# Patient Record
Sex: Female | Born: 1960
Health system: Southern US, Community
[De-identification: ages and names within clinical notes are randomized; demographics above are authoritative.]

## PROBLEM LIST (undated history)

## (undated) DIAGNOSIS — T7840XA Allergy, unspecified, initial encounter: Secondary | ICD-10-CM

## (undated) DIAGNOSIS — Z9889 Other specified postprocedural states: Secondary | ICD-10-CM

## (undated) DIAGNOSIS — J45909 Unspecified asthma, uncomplicated: Secondary | ICD-10-CM

## (undated) DIAGNOSIS — I10 Essential (primary) hypertension: Secondary | ICD-10-CM

## (undated) DIAGNOSIS — C801 Malignant (primary) neoplasm, unspecified: Secondary | ICD-10-CM

## (undated) DIAGNOSIS — E119 Type 2 diabetes mellitus without complications: Secondary | ICD-10-CM

## (undated) DIAGNOSIS — M1990 Unspecified osteoarthritis, unspecified site: Secondary | ICD-10-CM

## (undated) DIAGNOSIS — Z801 Family history of malignant neoplasm of trachea, bronchus and lung: Secondary | ICD-10-CM

## (undated) DIAGNOSIS — G43909 Migraine, unspecified, not intractable, without status migrainosus: Secondary | ICD-10-CM

## (undated) DIAGNOSIS — R112 Nausea with vomiting, unspecified: Secondary | ICD-10-CM

## (undated) DIAGNOSIS — Z8489 Family history of other specified conditions: Secondary | ICD-10-CM

## (undated) HISTORY — DX: Unspecified osteoarthritis, unspecified site: M19.90

## (undated) HISTORY — DX: Unspecified asthma, uncomplicated: J45.909

## (undated) HISTORY — PX: DILATION AND CURETTAGE OF UTERUS: SHX78

## (undated) HISTORY — DX: Allergy, unspecified, initial encounter: T78.40XA

## (undated) HISTORY — DX: Migraine, unspecified, not intractable, without status migrainosus: G43.909

## (undated) HISTORY — DX: Essential (primary) hypertension: I10

## (undated) HISTORY — DX: Family history of malignant neoplasm of trachea, bronchus and lung: Z80.1

---

## 1992-05-08 HISTORY — PX: BREAST CYST EXCISION: SHX579

## 1998-06-04 ENCOUNTER — Emergency Department (HOSPITAL_COMMUNITY): Admission: EM | Admit: 1998-06-04 | Discharge: 1998-06-04 | Payer: Self-pay | Admitting: Emergency Medicine

## 1999-04-28 ENCOUNTER — Other Ambulatory Visit: Admission: RE | Admit: 1999-04-28 | Discharge: 1999-04-28 | Payer: Self-pay | Admitting: Obstetrics and Gynecology

## 2000-08-14 ENCOUNTER — Other Ambulatory Visit: Admission: RE | Admit: 2000-08-14 | Discharge: 2000-08-14 | Payer: Self-pay | Admitting: Obstetrics and Gynecology

## 2002-05-28 ENCOUNTER — Other Ambulatory Visit: Admission: RE | Admit: 2002-05-28 | Discharge: 2002-05-28 | Payer: Self-pay | Admitting: Obstetrics and Gynecology

## 2002-07-10 ENCOUNTER — Encounter: Payer: Self-pay | Admitting: Obstetrics and Gynecology

## 2002-07-10 ENCOUNTER — Encounter: Admission: RE | Admit: 2002-07-10 | Discharge: 2002-07-10 | Payer: Self-pay | Admitting: Obstetrics and Gynecology

## 2003-07-13 ENCOUNTER — Other Ambulatory Visit: Admission: RE | Admit: 2003-07-13 | Discharge: 2003-07-13 | Payer: Self-pay | Admitting: Obstetrics and Gynecology

## 2005-02-01 ENCOUNTER — Other Ambulatory Visit: Admission: RE | Admit: 2005-02-01 | Discharge: 2005-02-01 | Payer: Self-pay | Admitting: Obstetrics and Gynecology

## 2006-11-10 ENCOUNTER — Emergency Department (HOSPITAL_COMMUNITY): Admission: EM | Admit: 2006-11-10 | Discharge: 2006-11-10 | Payer: Self-pay | Admitting: Emergency Medicine

## 2008-05-08 LAB — HM COLONOSCOPY

## 2009-06-15 ENCOUNTER — Emergency Department (HOSPITAL_COMMUNITY): Admission: EM | Admit: 2009-06-15 | Discharge: 2009-06-15 | Payer: Self-pay | Admitting: Emergency Medicine

## 2010-07-27 ENCOUNTER — Other Ambulatory Visit: Payer: Self-pay | Admitting: Emergency Medicine

## 2010-07-27 DIAGNOSIS — E079 Disorder of thyroid, unspecified: Secondary | ICD-10-CM

## 2010-08-05 ENCOUNTER — Ambulatory Visit
Admission: RE | Admit: 2010-08-05 | Discharge: 2010-08-05 | Disposition: A | Discharge status: Home / Self Care | Payer: No Typology Code available for payment source | Source: Ambulatory Visit | Attending: Emergency Medicine | Admitting: Emergency Medicine

## 2010-08-05 DIAGNOSIS — E079 Disorder of thyroid, unspecified: Secondary | ICD-10-CM

## 2010-08-09 ENCOUNTER — Other Ambulatory Visit: Payer: Self-pay | Admitting: Emergency Medicine

## 2010-08-09 DIAGNOSIS — E041 Nontoxic single thyroid nodule: Secondary | ICD-10-CM

## 2010-08-16 ENCOUNTER — Ambulatory Visit
Admission: RE | Admit: 2010-08-16 | Discharge: 2010-08-16 | Disposition: A | Discharge status: Home / Self Care | Payer: No Typology Code available for payment source | Source: Ambulatory Visit | Attending: Emergency Medicine | Admitting: Emergency Medicine

## 2010-08-16 ENCOUNTER — Other Ambulatory Visit: Payer: Self-pay | Admitting: Interventional Radiology

## 2010-08-16 ENCOUNTER — Other Ambulatory Visit (HOSPITAL_COMMUNITY)
Admission: RE | Admit: 2010-08-16 | Discharge: 2010-08-16 | Disposition: A | Discharge status: Home / Self Care | Payer: No Typology Code available for payment source | Source: Ambulatory Visit | Attending: Interventional Radiology | Admitting: Interventional Radiology

## 2010-08-16 DIAGNOSIS — E041 Nontoxic single thyroid nodule: Secondary | ICD-10-CM

## 2010-08-16 DIAGNOSIS — E049 Nontoxic goiter, unspecified: Secondary | ICD-10-CM | POA: Insufficient documentation

## 2011-02-06 ENCOUNTER — Other Ambulatory Visit: Payer: Self-pay | Admitting: Obstetrics and Gynecology

## 2011-02-06 DIAGNOSIS — R928 Other abnormal and inconclusive findings on diagnostic imaging of breast: Secondary | ICD-10-CM

## 2011-02-16 ENCOUNTER — Ambulatory Visit
Admission: RE | Admit: 2011-02-16 | Discharge: 2011-02-16 | Disposition: A | Discharge status: Home / Self Care | Payer: BC Managed Care – PPO | Source: Ambulatory Visit | Attending: Obstetrics and Gynecology | Admitting: Obstetrics and Gynecology

## 2011-02-16 DIAGNOSIS — R928 Other abnormal and inconclusive findings on diagnostic imaging of breast: Secondary | ICD-10-CM

## 2011-02-21 LAB — URINALYSIS, ROUTINE W REFLEX MICROSCOPIC
Glucose, UA: NEGATIVE
Ketones, ur: NEGATIVE
Specific Gravity, Urine: 1.011
pH: 6

## 2011-12-06 ENCOUNTER — Ambulatory Visit (INDEPENDENT_AMBULATORY_CARE_PROVIDER_SITE_OTHER): Payer: BC Managed Care – PPO | Admitting: Emergency Medicine

## 2011-12-06 VITALS — BP 140/84 | HR 82 | Temp 99.0°F | Resp 16 | Ht 67.0 in | Wt 186.0 lb

## 2011-12-06 DIAGNOSIS — G44209 Tension-type headache, unspecified, not intractable: Secondary | ICD-10-CM

## 2011-12-06 DIAGNOSIS — R197 Diarrhea, unspecified: Secondary | ICD-10-CM

## 2011-12-06 DIAGNOSIS — R1110 Vomiting, unspecified: Secondary | ICD-10-CM

## 2011-12-06 DIAGNOSIS — I10 Essential (primary) hypertension: Secondary | ICD-10-CM

## 2011-12-06 LAB — POCT CBC
Granulocyte percent: 80.2 % — AB (ref 37–80)
HCT, POC: 43.8 % (ref 37.7–47.9)
Hemoglobin: 13.7 g/dL (ref 12.2–16.2)
Lymph, poc: 1.5 (ref 0.6–3.4)
MCH, POC: 27.1 pg (ref 27–31.2)
MCHC: 31.3 g/dL — AB (ref 31.8–35.4)
MCV: 86.8 fL (ref 80–97)
MID (cbc): 0.4 (ref 0–0.9)
MPV: 8 fL (ref 0–99.8)
POC Granulocyte: 7.5 — AB (ref 2–6.9)
POC LYMPH PERCENT: 15.6 % (ref 10–50)
POC MID %: 4.2 % (ref 0–12)
Platelet Count, POC: 503 K/uL — AB (ref 142–424)
RBC: 5.05 M/uL (ref 4.04–5.48)
RDW, POC: 16.6 %
WBC: 9.4 K/uL (ref 4.6–10.2)

## 2011-12-06 LAB — COMPREHENSIVE METABOLIC PANEL
AST: 17 U/L (ref 0–37)
Alkaline Phosphatase: 47 U/L (ref 39–117)
BUN: 14 mg/dL (ref 6–23)
Creat: 0.82 mg/dL (ref 0.50–1.10)
Glucose, Bld: 95 mg/dL (ref 70–99)
Potassium: 3.9 mEq/L (ref 3.5–5.3)
Total Bilirubin: 0.6 mg/dL (ref 0.3–1.2)

## 2011-12-06 MED ORDER — ATENOLOL 50 MG PO TABS: ORAL_TABLET | ORAL | Status: DC

## 2011-12-06 MED ORDER — ONDANSETRON 8 MG PO TBDP: 8.0000 mg | ORAL_TABLET | Freq: Three times a day (TID) | ORAL | Status: AC | PRN

## 2011-12-06 MED ORDER — ONDANSETRON 4 MG PO TBDP
8.0000 mg | ORAL_TABLET | Freq: Once | ORAL | Status: AC
  Administered 2011-12-06: 8 mg via ORAL

## 2011-12-06 NOTE — Patient Instructions (Addendum)
Diarrhea Infections caused by germs (bacterial) or a virus commonly cause diarrhea. Your caregiver has determined that with time, rest and fluids, the diarrhea should improve. In general, eat normally while drinking more water than usual. Although water may prevent dehydration, it does not contain salt and minerals (electrolytes). Broths, weak tea without caffeine and oral rehydration solutions (ORS) replace fluids and electrolytes. Small amounts of fluids should be taken frequently. Large amounts at one time may not be tolerated. Plain water may be harmful in infants and the elderly. Oral rehydrating solutions (ORS) are available at pharmacies and grocery stores. ORS replace water and important electrolytes in proper proportions. Sports drinks are not as effective as ORS and may be harmful due to sugars worsening diarrhea.  ORS is especially recommended for use in children with diarrhea. As a general guideline for children, replace any new fluid losses from diarrhea and/or vomiting with ORS as follows:   If your child weighs 22 pounds or under (10 kg or less), give 60-120 mL ( -  cup or 2 - 4 ounces) of ORS for each episode of diarrheal stool or vomiting episode.   If your child weighs more than 22 pounds (more than 10 kgs), give 120-240 mL ( - 1 cup or 4 - 8 ounces) of ORS for each diarrheal stool or episode of vomiting.   While correcting for dehydration, children should eat normally. However, foods high in sugar should be avoided because this may worsen diarrhea. Large amounts of carbonated soft drinks, juice, gelatin desserts and other highly sugared drinks should be avoided.   After correction of dehydration, other liquids that are appealing to the child may be added. Children should drink small amounts of fluids frequently and fluids should be increased as tolerated. Children should drink enough fluids to keep urine clear or pale yellow.   Adults should eat normally while drinking more fluids  than usual. Drink small amounts of fluids frequently and increase as tolerated. Drink enough fluids to keep urine clear or pale yellow. Broths, weak decaffeinated tea, lemon lime soft drinks (allowed to go flat) and ORS replace fluids and electrolytes.   Avoid:   Carbonated drinks.   Juice.   Extremely hot or cold fluids.   Caffeine drinks.   Fatty, greasy foods.   Alcohol.   Tobacco.   Too much intake of anything at one time.   Gelatin desserts.   Probiotics are active cultures of beneficial bacteria. They may lessen the amount and number of diarrheal stools in adults. Probiotics can be found in yogurt with active cultures and in supplements.   Wash hands well to avoid spreading bacteria and virus.   Anti-diarrheal medications are not recommended for infants and children.   Only take over-the-counter or prescription medicines for pain, discomfort or fever as directed by your caregiver. Do not give aspirin to children because it may cause Reye's Syndrome.   For adults, ask your caregiver if you should continue all prescribed and over-the-counter medicines.   If your caregiver has given you a follow-up appointment, it is very important to keep that appointment. Not keeping the appointment could result in a chronic or permanent injury, and disability. If there is any problem keeping the appointment, you must call back to this facility for assistance.  SEEK IMMEDIATE MEDICAL CARE IF:   You or your child is unable to keep fluids down or other symptoms or problems become worse in spite of treatment.   Vomiting or diarrhea develops and becomes persistent.     There is vomiting of blood or bile (green material).   There is blood in the stool or the stools are black and tarry.   There is no urine output in 6-8 hours or there is only a small amount of very dark urine.   Abdominal pain develops, increases or localizes.   You have a fever.   Your baby is older than 3 months with a  rectal temperature of 102 F (38.9 C) or higher.   Your baby is 65 months old or younger with a rectal temperature of 100.4 F (38 C) or higher.   You or your child develops excessive weakness, dizziness, fainting or extreme thirst.   You or your child develops a rash, stiff neck, severe headache or become irritable or sleepy and difficult to awaken.  MAKE SURE YOU:   Understand these instructions.   Will watch your condition.   Will get help right away if you are not doing well or get worse.  Document Released: 04/14/2002 Document Revised: 04/13/2011 Document Reviewed: 03/01/2009 [facility] Patient Information 2012 [place].Nausea and Vomiting Nausea is a sick feeling that often comes before throwing up (vomiting). Vomiting is a reflex where stomach contents come out of your mouth. Vomiting can cause severe loss of body fluids (dehydration). Children and elderly adults can become dehydrated quickly, especially if they also have diarrhea. Nausea and vomiting are symptoms of a condition or disease. It is important to find the cause of your symptoms. CAUSES   Direct irritation of the stomach lining. This irritation can result from increased acid production (gastroesophageal reflux disease), infection, food poisoning, taking certain medicines (such as nonsteroidal anti-inflammatory drugs), alcohol use, or tobacco use.   Signals from the brain.These signals could be caused by a headache, heat exposure, an inner ear disturbance, increased pressure in the brain from injury, infection, a tumor, or a concussion, pain, emotional stimulus, or metabolic problems.   An obstruction in the gastrointestinal tract (bowel obstruction).   Illnesses such as diabetes, hepatitis, gallbladder problems, appendicitis, kidney problems, cancer, sepsis, atypical symptoms of a heart attack, or eating disorders.   Medical treatments such as chemotherapy and radiation.   Receiving medicine that makes you sleep  (general anesthetic) during surgery.  DIAGNOSIS Your caregiver may ask for tests to be done if the problems do not improve after a few days. Tests may also be done if symptoms are severe or if the reason for the nausea and vomiting is not clear. Tests may include:  Urine tests.   Blood tests.   Stool tests.   Cultures (to look for evidence of infection).   X-rays or other imaging studies.  Test results can help your caregiver make decisions about treatment or the need for additional tests. TREATMENT You need to stay well hydrated. Drink frequently but in small amounts.You may wish to drink water, sports drinks, clear broth, or eat frozen ice pops or gelatin dessert to help stay hydrated.When you eat, eating slowly may help prevent nausea.There are also some antinausea medicines that may help prevent nausea. HOME CARE INSTRUCTIONS   Take all medicine as directed by your caregiver.   If you do not have an appetite, do not force yourself to eat. However, you must continue to drink fluids.   If you have an appetite, eat a normal diet unless your caregiver tells you differently.   Eat a variety of complex carbohydrates (rice, wheat, potatoes, bread), lean meats, yogurt, fruits, and vegetables.   Avoid high-fat foods because they  are more difficult to digest.   Drink enough water and fluids to keep your urine clear or pale yellow.   If you are dehydrated, ask your caregiver for specific rehydration instructions. Signs of dehydration may include:   Severe thirst.   Dry lips and mouth.   Dizziness.   Dark urine.   Decreasing urine frequency and amount.   Confusion.   Rapid breathing or pulse.  SEEK IMMEDIATE MEDICAL CARE IF:   You have blood or brown flecks (like coffee grounds) in your vomit.   You have black or bloody stools.   You have a severe headache or stiff neck.   You are confused.   You have severe abdominal pain.   You have chest pain or trouble  breathing.   You do not urinate at least once every 8 hours.   You develop cold or clammy skin.   You continue to vomit for longer than 24 to 48 hours.   You have a fever.  MAKE SURE YOU:   Understand these instructions.   Will watch your condition.   Will get help right away if you are not doing well or get worse.  Document Released: 04/24/2005 Document Revised: 04/13/2011 Document Reviewed: 09/21/2010 [facility] Patient Information 2012 [place], [place]. Patient advised that if she were to develop any pain in her right lower abdomen to return to clinic immediately

## 2011-12-06 NOTE — Progress Notes (Signed)
  Subjective:    Patient ID: Autumn Cook, female    DOB: [birth year], 51 y.o.   MRN: [id number]  HPI patient enters with a five-day history of a frontal type headache. She checked her blood pressure at home and found it to be elevated to yesterday she gets sick with nausea and vomiting and diarrhea this persisted through the day but has subsided this morning. She has been able to keep down some crackers this morning. She has no fever chills or other symptoms.    Review of Systems     Objective:   Physical Exam HEENT exam is unremarkable. Her neck is supple chest is clear cardiac exam is unremarkable the abdomen is flat liver and spleen are nonenlarged there are no areas of tenderness. Mucous membranes are moist patient appears well-hydrated  Results for orders placed in visit on 12/06/11  POCT CBC      Component Value Range   WBC 9.4  4.6 - 10.2 K/uL   Lymph, poc 1.5  0.6 - 3.4   POC LYMPH PERCENT 15.6  10 - 50 %L   MID (cbc) 0.4  0 - 0.9   POC MID % 4.2  0 - 12 %M   POC Granulocyte 7.5 (*) 2 - 6.9   Granulocyte percent 80.2 (*) 37 - 80 %G   RBC 5.05  4.04 - 5.48 M/uL   Hemoglobin 13.7  12.2 - 16.2 g/dL   HCT, POC 40.9  81.1 - 47.9 %   MCV 86.8  80 - 97 fL   MCH, POC 27.1  27 - 31.2 pg   MCHC 31.3 (*) 31.8 - 35.4 g/dL   RDW, POC 91.4     Platelet Count, POC 503 (*) 142 - 424 K/uL   MPV 8.0  0 - 99.8 fL        Assessment & Plan:  We'll go ahead and check routine labs along with a CBC. We'll increase her atenolol to 50 mg a day. CBC looks okay we'll go ahead and call in Zofran .

## 2011-12-31 ENCOUNTER — Ambulatory Visit (INDEPENDENT_AMBULATORY_CARE_PROVIDER_SITE_OTHER): Payer: BC Managed Care – PPO | Admitting: Emergency Medicine

## 2011-12-31 VITALS — BP 129/84 | HR 66 | Temp 97.8°F | Resp 16 | Ht 67.0 in | Wt 186.0 lb

## 2011-12-31 DIAGNOSIS — I10 Essential (primary) hypertension: Secondary | ICD-10-CM

## 2011-12-31 MED ORDER — METOPROLOL SUCCINATE ER 100 MG PO TB24: 100.0000 mg | ORAL_TABLET | Freq: Every day | ORAL | Status: DC

## 2011-12-31 NOTE — Progress Notes (Signed)
   Date:  12/31/2011   Name:  Autumn Cook   DOB:  1960-11-17   MRN:  [id number] Gender: female Age: 51 y.o.  PCP:  Lucilla Edin, MD    Chief Complaint: Follow-up   History of Present Illness:  Autumn Cook is a 51 y.o. pleasant patient who presents with the following:  Long history of hypertension treated with atenolol and HCTZ.  Says her blood pressure has been drifting into the 165/95 range and is experiencing more frequent headaches requiring treatment and a near constant low level headache that she can control with tylenol.  No neuro or visual or cardiopulmonary symptoms associated with these elevations.  Has spontaneously increased her HCTZ to 50 mg doubling the dose.    There is no problem list on file for this patient.   No past medical history on file.  No past surgical history on file.  History  Substance Use Topics  . Smoking status: Former Games developer  . Smokeless tobacco: Not on file  . Alcohol Use: Not on file    No family history on file.  Allergies  Allergen Reactions  . Penicillins Hives    Medication list has been reviewed and updated.  Current Outpatient Prescriptions on File Prior to Visit  Medication Sig Dispense Refill  . atenolol (TENORMIN) 50 MG tablet Take one tablet daily  30 tablet  11  . hydrochlorothiazide (HYDRODIURIL) 25 MG tablet Take 25 mg by mouth daily.        Review of Systems:  As per HPI, otherwise negative.    Physical Examination: Filed Vitals:   12/31/11 0753  BP: 129/84  Pulse: 66  Temp: 97.8 F (36.6 C)  Resp: 16   Filed Vitals:   12/31/11 0753  Height: 5\' 7"  (1.702 m)  Weight: 186 lb (84.369 kg)   Body mass index is 29.13 kg/(m^2). Ideal Body Weight: Weight in (lb) to have BMI = 25: 159.3    GEN: WDWN, NAD, Non-toxic, Alert & Oriented x 3 HEENT: Atraumatic, Normocephalic.  Ears and Nose: No external deformity. EXTR: No clubbing/cyanosis/edema NEURO: Normal gait.  PSYCH: Normally interactive.  Conversant. Not depressed or anxious appearing.  Calm demeanor.  Chest:  CTA BS= COR  RRR  Assessment and Plan: Start toprol xl in place of atenolol Increase to 100 daily Continue to monitor BP Decrease HCTZ to 25. Follow up as needed and for BP <110/60  Carmelina Dane, MD

## 2012-01-02 ENCOUNTER — Ambulatory Visit: Payer: BC Managed Care – PPO | Admitting: Family Medicine

## 2012-03-03 ENCOUNTER — Other Ambulatory Visit: Payer: Self-pay | Admitting: Physician Assistant

## 2012-03-06 ENCOUNTER — Other Ambulatory Visit: Payer: Self-pay | Admitting: Physician Assistant

## 2012-07-30 ENCOUNTER — Other Ambulatory Visit: Payer: Self-pay | Admitting: Emergency Medicine

## 2012-08-13 ENCOUNTER — Other Ambulatory Visit: Payer: Self-pay | Admitting: Obstetrics and Gynecology

## 2012-08-13 DIAGNOSIS — R928 Other abnormal and inconclusive findings on diagnostic imaging of breast: Secondary | ICD-10-CM

## 2012-08-29 ENCOUNTER — Ambulatory Visit
Admission: RE | Admit: 2012-08-29 | Discharge: 2012-08-29 | Disposition: A | Discharge status: Home / Self Care | Payer: BC Managed Care – PPO | Source: Ambulatory Visit | Attending: Obstetrics and Gynecology | Admitting: Obstetrics and Gynecology

## 2012-08-29 DIAGNOSIS — R928 Other abnormal and inconclusive findings on diagnostic imaging of breast: Secondary | ICD-10-CM

## 2012-09-05 ENCOUNTER — Other Ambulatory Visit: Payer: Self-pay | Admitting: Physician Assistant

## 2012-09-05 ENCOUNTER — Other Ambulatory Visit: Payer: Self-pay | Admitting: Emergency Medicine

## 2012-09-11 ENCOUNTER — Ambulatory Visit (INDEPENDENT_AMBULATORY_CARE_PROVIDER_SITE_OTHER): Payer: BC Managed Care – PPO | Admitting: Family Medicine

## 2012-09-11 ENCOUNTER — Encounter: Payer: Self-pay | Admitting: Family Medicine

## 2012-09-11 VITALS — BP 137/76 | HR 79 | Temp 98.7°F | Resp 16 | Ht 67.5 in | Wt 190.0 lb

## 2012-09-11 DIAGNOSIS — G43909 Migraine, unspecified, not intractable, without status migrainosus: Secondary | ICD-10-CM | POA: Insufficient documentation

## 2012-09-11 DIAGNOSIS — R002 Palpitations: Secondary | ICD-10-CM

## 2012-09-11 DIAGNOSIS — E663 Overweight: Secondary | ICD-10-CM

## 2012-09-11 DIAGNOSIS — Z8639 Personal history of other endocrine, nutritional and metabolic disease: Secondary | ICD-10-CM | POA: Insufficient documentation

## 2012-09-11 DIAGNOSIS — E669 Obesity, unspecified: Secondary | ICD-10-CM

## 2012-09-11 DIAGNOSIS — Z87891 Personal history of nicotine dependence: Secondary | ICD-10-CM | POA: Insufficient documentation

## 2012-09-11 DIAGNOSIS — I10 Essential (primary) hypertension: Secondary | ICD-10-CM | POA: Insufficient documentation

## 2012-09-11 MED ORDER — HYDROCHLOROTHIAZIDE 25 MG PO TABS: 25.0000 mg | ORAL_TABLET | Freq: Every day | ORAL | Status: DC

## 2012-09-11 MED ORDER — ATENOLOL 50 MG PO TABS: ORAL_TABLET | ORAL | Status: DC

## 2012-09-11 NOTE — Patient Instructions (Addendum)
Exercise to Lose Weight Exercise and a healthy diet may help you lose weight. Your doctor may suggest specific exercises. EXERCISE IDEAS AND TIPS  Choose low-cost things you enjoy doing, such as walking, bicycling, or exercising to workout videos.  Take stairs instead of the elevator.  Walk during your lunch break.  Park your car further away from work or school.  Go to a gym or an exercise class.  Start with 5 to 10 minutes of exercise each day. Build up to 30 minutes of exercise 4 to 6 days a week.  Wear shoes with good support and comfortable clothes.  Stretch before and after working out.  Work out until you breathe harder and your heart beats faster.  Drink extra water when you exercise.  Do not do so much that you hurt yourself, feel dizzy, or get very short of breath. Exercises that burn about 150 calories:  Running 1  miles in 15 minutes.  Playing volleyball for 45 to 60 minutes.  Washing and waxing a car for 45 to 60 minutes.  Playing touch football for 45 minutes.  Walking 1  miles in 35 minutes.  Pushing a stroller 1  miles in 30 minutes.  Playing basketball for 30 minutes.  Raking leaves for 30 minutes.  Bicycling 5 miles in 30 minutes.  Walking 2 miles in 30 minutes.  Dancing for 30 minutes.  Shoveling snow for 15 minutes.  Swimming laps for 20 minutes.  Walking up stairs for 15 minutes.  Bicycling 4 miles in 15 minutes.  Gardening for 30 to 45 minutes.  Jumping rope for 15 minutes.  Washing windows or floors for 45 to 60 minutes. Document Released: 05/27/2010 Document Revised: 07/17/2011 Document Reviewed: 05/27/2010 [facility] Patient Information 2013 [place], [place].   For weight loss, also try reducing portion sizes as outlined on the handout you are given.  Medication has been changed back to  Atenolol 50 mg 1 tablet daily. Do not use OTC weight loss supplements. When you return in 3 months, bring your lab results from Dr.  Marcelle Overlie. We will do additional labs at that time if needed.

## 2012-09-11 NOTE — Progress Notes (Signed)
S:  This 52 y.o. AA female has HTN; she has gained some weight and has started a walking program. However, she notices palpitations ans dyspnea within a few minutes of starting her exercise. She is awakened at night w/ palpitations . She recently had GYN exam and had serum levels checked for "menopause" hormone changes. She reports feeling better when taking Atenolol versus Metoprolol which was prescribed in August 2013; pt reports that her BP was elevated at that time due to taking OTC weight loss supplement. SOB seems to coincide w/ starting Metoprolol. Pt deies excessive caffeine intake; she drinks decaff teas, juices and water.   Patient Active Problem List   Diagnosis Date Noted  . Hypertension 09/11/2012  . Overweight (BMI 25.0-29.9) 09/11/2012  . Migraine, unspecified, without mention of intractable migraine without mention of status migrainosus 09/11/2012  . Hx of thyroid nodule 09/11/2012    PMHx, Soc Hx and Fam Hx reviewed.   ROS: As per HPI; negative for appetite or weight change, fatigue, CP or tightness, cough, wheezing, apnea, abd pain, n/v or change in bowel habits, rashes, erythema, HA, dizziness, tremor, numbness, weakness or syncope. Sleep disturbance as noted.  O: Filed Vitals:   09/11/12 0951  BP: 137/76  Pulse: 79  Temp: 98.7 F (37.1 C)  Resp: 16   GEN: In NAD; WN,WD. HEENT: [place]/AT; EOMI w/ clear conj/ sclerae. EACs/nose normal. Oroph clear and moist. Normal dentition. NECK: Supple w/o  LAN or TMG. No JVD. COR: RRR. No m/g/r. No edema. LUNGS: CTA. No wheezes or rhonchi or rales. SKIN: W&D. No erythema or pallor. MS: MAEs. No c/c/e. No deformities. No muscle tenderness. NEURO: A&O x 3; Cns intact. Nonfocal.   ECG: NSR; no ST-TW changes. No ectopy.   A/P: Palpitations -  Suspect perimenopausal changes. Pt had labs checked at recent GYN appt; I requested she bring results to next appt. Plan: EKG 12-Lead  Hypertension -  Possible side effect of dyspnea w/  Metoprolol.  Plan: Resume  atenolol (TENORMIN) 50 MG tablet 1 tablet daily.  Overweight (BMI 25.0-29.9)-  Encouraged better nutrition/ smaller portions and regular fitness routine.

## 2012-10-12 ENCOUNTER — Ambulatory Visit (INDEPENDENT_AMBULATORY_CARE_PROVIDER_SITE_OTHER): Payer: BC Managed Care – PPO | Admitting: Internal Medicine

## 2012-10-12 VITALS — BP 131/82 | HR 77 | Temp 98.0°F | Resp 17 | Ht 67.0 in | Wt 188.0 lb

## 2012-10-12 DIAGNOSIS — J019 Acute sinusitis, unspecified: Secondary | ICD-10-CM

## 2012-10-12 DIAGNOSIS — H109 Unspecified conjunctivitis: Secondary | ICD-10-CM

## 2012-10-12 DIAGNOSIS — R05 Cough: Secondary | ICD-10-CM

## 2012-10-12 DIAGNOSIS — R059 Cough, unspecified: Secondary | ICD-10-CM

## 2012-10-12 MED ORDER — AZITHROMYCIN 500 MG PO TABS: 500.0000 mg | ORAL_TABLET | Freq: Every day | ORAL | Status: DC

## 2012-10-12 MED ORDER — OFLOXACIN 0.3 % OP SOLN: 1.0000 [drp] | OPHTHALMIC | Status: DC

## 2012-10-12 NOTE — Patient Instructions (Signed)
Sinusitis Sinusitis is redness, soreness, and swelling (inflammation) of the paranasal sinuses. Paranasal sinuses are air pockets within the bones of your face (beneath the eyes, the middle of the forehead, or above the eyes). In healthy paranasal sinuses, mucus is able to drain out, and air is able to circulate through them by way of your nose. However, when your paranasal sinuses are inflamed, mucus and air can become trapped. This can allow bacteria and other germs to grow and cause infection. Sinusitis can develop quickly and last only a short time (acute) or continue over a long period (chronic). Sinusitis that lasts for more than 12 weeks is considered chronic.  CAUSES  Causes of sinusitis include:  Allergies.  Structural abnormalities, such as displacement of the cartilage that separates your nostrils (deviated septum), which can decrease the air flow through your nose and sinuses and affect sinus drainage.  Functional abnormalities, such as when the small hairs (cilia) that line your sinuses and help remove mucus do not work properly or are not present. SYMPTOMS  Symptoms of acute and chronic sinusitis are the same. The primary symptoms are pain and pressure around the affected sinuses. Other symptoms include:  Upper toothache.  Earache.  Headache.  Bad breath.  Decreased sense of smell and taste.  A cough, which worsens when you are lying flat.  Fatigue.  Fever.  Thick drainage from your nose, which often is green and may contain pus (purulent).  Swelling and warmth over the affected sinuses. DIAGNOSIS  Your caregiver will perform a physical exam. During the exam, your caregiver may:  Look in your nose for signs of abnormal growths in your nostrils (nasal polyps).  Tap over the affected sinus to check for signs of infection.  View the inside of your sinuses (endoscopy) with a special imaging device with a light attached (endoscope), which is inserted into your  sinuses. If your caregiver suspects that you have chronic sinusitis, one or more of the following tests may be recommended:  Allergy tests.  Nasal culture A sample of mucus is taken from your nose and sent to a lab and screened for bacteria.  Nasal cytology A sample of mucus is taken from your nose and examined by your caregiver to determine if your sinusitis is related to an allergy. TREATMENT  Most cases of acute sinusitis are related to a viral infection and will resolve on their own within 10 days. Sometimes medicines are prescribed to help relieve symptoms (pain medicine, decongestants, nasal steroid sprays, or saline sprays).  However, for sinusitis related to a bacterial infection, your caregiver will prescribe antibiotic medicines. These are medicines that will help kill the bacteria causing the infection.  Rarely, sinusitis is caused by a fungal infection. In theses cases, your caregiver will prescribe antifungal medicine. For some cases of chronic sinusitis, surgery is needed. Generally, these are cases in which sinusitis recurs more than 3 times per year, despite other treatments. HOME CARE INSTRUCTIONS   Drink plenty of water. Water helps thin the mucus so your sinuses can drain more easily.  Use a humidifier.  Inhale steam 3 to 4 times a day (for example, sit in the bathroom with the shower running).  Apply a warm, moist washcloth to your face 3 to 4 times a day, or as directed by your caregiver.  Use saline nasal sprays to help moisten and clean your sinuses.  Take over-the-counter or prescription medicines for pain, discomfort, or fever only as directed by your caregiver. SEEK IMMEDIATE MEDICAL   CARE IF:  You have increasing pain or severe headaches.  You have nausea, vomiting, or drowsiness.  You have swelling around your face.  You have vision problems.  You have a stiff neck.  You have difficulty breathing. MAKE SURE YOU:   Understand these  instructions.  Will watch your condition.  Will get help right away if you are not doing well or get worse. Document Released: 04/24/2005 Document Revised: 07/17/2011 Document Reviewed: 05/09/2011 [employer] Patient Information 2014 [employer].   Conjunctivitis Conjunctivitis is commonly called "pink eye." Conjunctivitis can be caused by bacterial or viral infection, allergies, or injuries. There is usually redness of the lining of the eye, itching, discomfort, and sometimes discharge. There may be deposits of matter along the eyelids. A viral infection usually causes a watery discharge, while a bacterial infection causes a yellowish, thick discharge. Pink eye is very contagious and spreads by direct contact. You may be given antibiotic eyedrops as part of your treatment. Before using your eye medicine, remove all drainage from the eye by washing gently with warm water and cotton balls. Continue to use the medication until you have awakened 2 mornings in a row without discharge from the eye. Do not rub your eye. This increases the irritation and helps spread infection. Use separate towels from other household members. Wash your hands with soap and water before and after touching your eyes. Use cold compresses to reduce pain and sunglasses to relieve irritation from light. Do not wear contact lenses or wear eye makeup until the infection is gone. SEEK MEDICAL CARE IF:   Your symptoms are not better after 3 days of treatment.  You have increased pain or trouble seeing.  The outer eyelids become very red or swollen. Document Released: 06/01/2004 Document Revised: 07/17/2011 Document Reviewed: 04/24/2005 [employer] Patient Information 2014 [employer].  

## 2012-10-12 NOTE — Progress Notes (Signed)
  Subjective:    Patient ID: Autumn Cook, female    DOB: [birth year], 52 y.o.   MRN: [id number]  HPI Has copious head congestion and left eye has yellow drainage. No vision loss, has been rubbing eye and it aches.   Review of Systems     Objective:   Physical Exam  Constitutional: She is oriented to person, place, and time. She appears well-developed and well-nourished. No distress.  HENT:  Right Ear: External ear normal.  Left Ear: External ear normal.  Nose: Mucosal edema, rhinorrhea and sinus tenderness present. Right sinus exhibits maxillary sinus tenderness. Right sinus exhibits no frontal sinus tenderness. Left sinus exhibits maxillary sinus tenderness. Left sinus exhibits no frontal sinus tenderness.  Mouth/Throat: Oropharynx is clear and moist.  Eyes: EOM are normal. Pupils are equal, round, and reactive to light. Right eye exhibits no discharge. Left eye exhibits discharge.  Neck: Normal range of motion. Neck supple.  Cardiovascular: Normal rate.   Pulmonary/Chest: Effort normal and breath sounds normal.  Lymphadenopathy:    She has no cervical adenopathy.  Neurological: She is alert and oriented to person, place, and time. She exhibits normal muscle tone. Coordination normal.  Skin: No rash noted.  Psychiatric: She has a normal mood and affect.   No preauricular nodes, conjunctiva mod red, mod purulent disch.       Assessment & Plan:  Sinusitis/Conjunctivitis left Ocuflox/Zithromax 500mg

## 2012-12-12 ENCOUNTER — Ambulatory Visit: Payer: BC Managed Care – PPO | Admitting: Family Medicine

## 2013-05-08 LAB — HM PAP SMEAR: HM PAP: NORMAL

## 2013-05-17 ENCOUNTER — Other Ambulatory Visit: Payer: Self-pay | Admitting: Family Medicine

## 2013-05-21 ENCOUNTER — Other Ambulatory Visit: Payer: Self-pay | Admitting: Family Medicine

## 2013-07-06 ENCOUNTER — Other Ambulatory Visit: Payer: Self-pay | Admitting: Physician Assistant

## 2013-09-29 ENCOUNTER — Other Ambulatory Visit: Payer: Self-pay | Admitting: Family Medicine

## 2013-10-10 ENCOUNTER — Other Ambulatory Visit: Payer: Self-pay | Admitting: Family Medicine

## 2013-10-10 ENCOUNTER — Other Ambulatory Visit: Payer: Self-pay | Admitting: Physician Assistant

## 2013-10-14 ENCOUNTER — Other Ambulatory Visit: Payer: Self-pay | Admitting: Family Medicine

## 2013-11-19 ENCOUNTER — Telehealth: Payer: Self-pay

## 2013-11-19 MED ORDER — HYDROCHLOROTHIAZIDE 25 MG PO TABS: 25.0000 mg | ORAL_TABLET | Freq: Every day | ORAL | Status: DC

## 2013-11-19 MED ORDER — ATENOLOL 50 MG PO TABS: 50.0000 mg | ORAL_TABLET | Freq: Every day | ORAL | Status: DC

## 2013-11-19 NOTE — Telephone Encounter (Signed)
Sent in med for pt. Pt notified.

## 2013-11-19 NOTE — Telephone Encounter (Signed)
Dr.Mcpherson, Pt is currently completely out of her b/p medication, she c/o a severe headache due to not taking the medication. She would like to know if she could have a refill on her bp medication to last her until her appt on Friday. Best# [phone]

## 2013-11-21 ENCOUNTER — Ambulatory Visit (INDEPENDENT_AMBULATORY_CARE_PROVIDER_SITE_OTHER): Payer: Managed Care, Other (non HMO) | Admitting: Family Medicine

## 2013-11-21 ENCOUNTER — Encounter: Payer: Self-pay | Admitting: Family Medicine

## 2013-11-21 VITALS — BP 190/106 | HR 74 | Temp 98.6°F | Resp 18 | Ht 66.0 in | Wt 190.0 lb

## 2013-11-21 DIAGNOSIS — I10 Essential (primary) hypertension: Secondary | ICD-10-CM

## 2013-11-21 MED ORDER — ATENOLOL 50 MG PO TABS: 50.0000 mg | ORAL_TABLET | Freq: Every day | ORAL | Status: DC

## 2013-11-21 MED ORDER — HYDROCHLOROTHIAZIDE 25 MG PO TABS: 25.0000 mg | ORAL_TABLET | Freq: Every day | ORAL | Status: DC

## 2013-11-23 NOTE — Progress Notes (Signed)
S:  This 53 y.o. AA female has HTN which she reports is well controlled when measured at home. She had been without her medication for a few days. She recently returned from a [place] cruise bu tried to be mindful about nutrition. She denies fatigue, diaphoresis, vision disturbances, CP or tightness, palpitations, edema, SOB or DOE, cough, HA, dizziness, numbness, weakness or syncope.  Patient Active Problem List   Diagnosis Date Noted  . Hypertension 09/11/2012  . Overweight (BMI 25.0-29.9) 09/11/2012  . Migraine, unspecified, without mention of intractable migraine without mention of status migrainosus 09/11/2012  . Hx of thyroid nodule 09/11/2012  . History of tobacco use 09/11/2012    Prior to Admission medications   Medication Sig Start Date End Date Taking? Authorizing Provider  atenolol (TENORMIN) 50 MG tablet Take 1 tablet (50 mg total) by mouth daily.   Yes Barton Fanny, MD  ofloxacin (OCUFLOX) 0.3 % ophthalmic solution Place 1 drop into the left eye every 4 (four) hours. 10/12/12  Yes Orma Flaming, MD  hydrochlorothiazide (HYDRODIURIL) 25 MG tablet Take 1 tablet (25 mg total) by mouth daily.    Barton Fanny, MD   PMHx, Surg Hx, Soc and Fam Hx reviewed.  ROS: As per HPI.  O: Filed Vitals:   11/21/13 1607 11/21/13 1609  BP: 169/104 190/106  Pulse: 74   Temp: 98.6 F (37 C)   TempSrc: Oral   Resp: 18   Height: 5\' 6"  (1.676 m)   Weight: 190 lb (86.183 kg)   SpO2: 98%    GEN: In NAD: WN,WD. HENT: [place]/AT; EOMI w/ clear conj/sclerae. Otherwise unremarkable. COR: RRR. No edema.  LUNGS: Unlabored resp. SKIN: W&D; intact w/o diaphoresis or erythema. NEURO: A&O x 3; Cns intact. Nonfocal.  A/P: Essential hypertension- Continue current medications. Encouraged weight reduction and healthy nutrition.  Meds ordered this encounter  Medications  . hydrochlorothiazide (HYDRODIURIL) 25 MG tablet    Sig: Take 1 tablet (25 mg total) by mouth daily.    Dispense:  30  tablet    Refill:  5  . atenolol (TENORMIN) 50 MG tablet    Sig: Take 1 tablet (50 mg total) by mouth daily.    Dispense:  30 tablet    Refill:  5

## 2013-11-24 ENCOUNTER — Encounter: Payer: Self-pay | Admitting: Family Medicine

## 2013-11-24 ENCOUNTER — Ambulatory Visit (INDEPENDENT_AMBULATORY_CARE_PROVIDER_SITE_OTHER): Payer: Managed Care, Other (non HMO) | Admitting: Family Medicine

## 2013-11-24 VITALS — BP 174/98 | HR 70 | Temp 98.2°F | Resp 16 | Ht 66.75 in | Wt 190.6 lb

## 2013-11-24 DIAGNOSIS — I10 Essential (primary) hypertension: Secondary | ICD-10-CM

## 2013-11-24 LAB — POCT URINALYSIS DIPSTICK
Bilirubin, UA: NEGATIVE
Glucose, UA: NEGATIVE
Leukocytes, UA: NEGATIVE
Nitrite, UA: NEGATIVE
PH UA: 5
Protein, UA: 30
RBC UA: NEGATIVE
UROBILINOGEN UA: 0.2

## 2013-11-24 MED ORDER — AMLODIPINE BESYLATE 5 MG PO TABS: 5.0000 mg | ORAL_TABLET | Freq: Every day | ORAL | Status: DC

## 2013-11-24 NOTE — Progress Notes (Signed)
Subjective:    Patient ID: Autumn Cook, female    DOB: Oct 11, 1960, 53 y.o.   MRN: [id number]  11/24/2013  Follow-up and Hypertension   HPI This 53 y.o. female presents for 72 hour follow-up/evaluation of persistently elevated blood pressure.  Started HTN medications again on Wednesday 7/15 without improvement in BP.  Blood pressure remaining elevated.  Home BP readings running 163/101, 169/105, 172/104, 190/105, 189/106.  Compliance with HTZ 25mg  once daily; Atenolol 50mg  once daily. Pulse running 60s-70s.  Has also been checking with another BP machine at home and BP readings are similar.  Not feeling well; funny headache that can be intense at times, +R lower back pain, +mild chest tightness intermittent.  +mild blurred vision with headache.  R sided n/t sporadically but not persistent.  No weakness in arms.  It is hard to pick up things intermittently.  Onset of HTN years ago; has been taking medication x 10 years.  Weight is stable but has slowly increased over the past couple of years. Has joined gym to get weight down again.  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  Just returned from cruise. No alcohol intake.  Pt concerned that medication is not lowering BP.     Review of Systems  Constitutional: Negative for fever, chills, diaphoresis and fatigue.  Eyes: Positive for visual disturbance. Negative for photophobia.  Respiratory: Negative for shortness of breath, wheezing and stridor.   Cardiovascular: Negative for chest pain, palpitations and leg swelling.  Gastrointestinal: Negative for nausea, vomiting and abdominal pain.  Skin: Negative for rash.  Neurological: Positive for numbness and headaches. Negative for dizziness, tremors, seizures, syncope, facial asymmetry, speech difficulty, weakness and light-headedness.    Past Medical History  Diagnosis Date  . Allergy   . Arthritis   . Migraine    History reviewed. No pertinent past surgical history. Allergies  Allergen  Reactions  . Penicillins Hives   Current Outpatient Prescriptions  Medication Sig Dispense Refill  . atenolol (TENORMIN) 50 MG tablet Take 1 tablet (50 mg total) by mouth daily.  30 tablet  5  . hydrochlorothiazide (HYDRODIURIL) 25 MG tablet Take 1 tablet (25 mg total) by mouth daily.  30 tablet  5  . ofloxacin (OCUFLOX) 0.3 % ophthalmic solution Place 1 drop into the left eye every 4 (four) hours.  5 mL  0  . amLODipine (NORVASC) 5 MG tablet Take 1 tablet (5 mg total) by mouth daily.  30 tablet  5   No current facility-administered medications for this visit.   History   Social History  . Marital Status: Married    Spouse Name: N/A    Number of Children: N/A  . Years of Education: N/A   Occupational History  . real estate agent    Social History Main Topics  . Smoking status: Former Research scientist (life sciences)  . Smokeless tobacco: Not on file  . Alcohol Use: No  . Drug Use: No  . Sexual Activity: No   Other Topics Concern  . Not on file   Social History Narrative   married   Family History  Problem Relation Age of Onset  . Hypertension Father   . Cancer Father 29    lung cancer  . Stroke Father   . Heart disease Mother     arrhythmia  . Kidney failure Sister        Objective:    BP 174/98  Pulse 70  Temp(Src) 98.2 F (36.8 C) (Oral)  Resp 16  Ht  5' 6.75" (1.695 m)  Wt 190 lb 9.6 oz (86.456 kg)  BMI 30.09 kg/m2  SpO2 98%  LMP 10/22/2013 Physical Exam  Constitutional: She is oriented to person, place, and time. She appears well-developed and well-nourished. No distress.  HENT:  Head: Normocephalic and atraumatic.  Right Ear: External ear normal.  Left Ear: External ear normal.  Nose: Nose normal.  Mouth/Throat: Oropharynx is clear and moist.  Eyes: Conjunctivae and EOM are normal. Pupils are equal, round, and reactive to light.  Neck: Normal range of motion. Neck supple. Carotid bruit is not present. No thyromegaly present.  Cardiovascular: Normal rate, regular  rhythm, normal heart sounds and intact distal pulses.  Exam reveals no gallop and no friction rub.   No murmur heard. Pulmonary/Chest: Effort normal and breath sounds normal. She has no wheezes. She has no rales.  Abdominal: Soft. Bowel sounds are normal. She exhibits no distension and no mass. There is no tenderness. There is no rebound and no guarding.  Lymphadenopathy:    She has no cervical adenopathy.  Neurological: She is alert and oriented to person, place, and time. No cranial nerve deficit. She exhibits normal muscle tone. Coordination normal.  Skin: Skin is warm and dry. No rash noted. She is not diaphoretic. No erythema. No pallor.  Psychiatric: She has a normal mood and affect. Her behavior is normal.   Results for orders placed in visit on 11/24/13  POCT URINALYSIS DIPSTICK      Result Value Ref Range   Color, UA yellow     Clarity, UA clear     Glucose, UA neg     Bilirubin, UA neg     Ketones, UA trace     Spec Grav, UA >=1.030     Blood, UA neg     pH, UA 5.0     Protein, UA 30     Urobilinogen, UA 0.2     Nitrite, UA neg     Leukocytes, UA Negative     EKG: NSR; diffuse ST changes.    Assessment & Plan:   1. Essential hypertension, benign     1. HTN: uncontrolled; continue HCTZ 25mg  daily; continue Atenolol 50mg  daily; will not increase Atenolol due to lower heart rate with home readings.  Add Amlodipine 5mg  daily.  RTC 72 hours to see Dr. Everlene Farrier due to current symptoms. To ED for acute worsening of symptoms.  Did not add ACE or ARB because desire for improvement in BP readings in upcoming 72 hours.    Meds ordered this encounter  Medications  . amLODipine (NORVASC) 5 MG tablet    Sig: Take 1 tablet (5 mg total) by mouth daily.    Dispense:  30 tablet    Refill:  5    No Follow-up on file.    Reginia Forts, M.D.  [facility] [address] [place], [place]  [address] [phone] phone [phone] fax

## 2013-11-25 ENCOUNTER — Encounter: Payer: Self-pay | Admitting: Family Medicine

## 2013-11-25 LAB — CBC WITH DIFFERENTIAL/PLATELET
BASOS ABS: 0 10*3/uL (ref 0.0–0.1)
Basophils Relative: 0 % (ref 0–1)
Eosinophils Absolute: 0.2 10*3/uL (ref 0.0–0.7)
Eosinophils Relative: 3 % (ref 0–5)
HCT: 40.4 % (ref 36.0–46.0)
Hemoglobin: 13.7 g/dL (ref 12.0–15.0)
Lymphocytes Relative: 34 % (ref 12–46)
Lymphs Abs: 2.2 10*3/uL (ref 0.7–4.0)
MCH: 28.8 pg (ref 26.0–34.0)
MCHC: 33.9 g/dL (ref 30.0–36.0)
MCV: 84.9 fL (ref 78.0–100.0)
MONO ABS: 0.4 10*3/uL (ref 0.1–1.0)
Monocytes Relative: 6 % (ref 3–12)
NEUTROS ABS: 3.6 10*3/uL (ref 1.7–7.7)
NEUTROS PCT: 57 % (ref 43–77)
Platelets: 386 10*3/uL (ref 150–400)
RBC: 4.76 MIL/uL (ref 3.87–5.11)
RDW: 15.5 % (ref 11.5–15.5)
WBC: 6.4 10*3/uL (ref 4.0–10.5)

## 2013-11-25 LAB — COMPREHENSIVE METABOLIC PANEL
ALT: 14 U/L (ref 0–35)
AST: 14 U/L (ref 0–37)
Albumin: 4.5 g/dL (ref 3.5–5.2)
Alkaline Phosphatase: 56 U/L (ref 39–117)
BUN: 12 mg/dL (ref 6–23)
CALCIUM: 9.7 mg/dL (ref 8.4–10.5)
CHLORIDE: 98 meq/L (ref 96–112)
CO2: 29 mEq/L (ref 19–32)
Creat: 0.78 mg/dL (ref 0.50–1.10)
Glucose, Bld: 102 mg/dL — ABNORMAL HIGH (ref 70–99)
POTASSIUM: 3.8 meq/L (ref 3.5–5.3)
SODIUM: 138 meq/L (ref 135–145)
TOTAL PROTEIN: 7.7 g/dL (ref 6.0–8.3)
Total Bilirubin: 0.3 mg/dL (ref 0.2–1.2)

## 2013-11-25 LAB — TSH: TSH: 0.888 u[IU]/mL (ref 0.350–4.500)

## 2013-11-28 ENCOUNTER — Ambulatory Visit (INDEPENDENT_AMBULATORY_CARE_PROVIDER_SITE_OTHER): Payer: Managed Care, Other (non HMO) | Admitting: Family Medicine

## 2013-11-28 ENCOUNTER — Encounter: Payer: Self-pay | Admitting: Family Medicine

## 2013-11-28 VITALS — BP 143/90 | HR 70 | Temp 97.9°F | Resp 16 | Ht 66.5 in | Wt 188.0 lb

## 2013-11-28 DIAGNOSIS — I10 Essential (primary) hypertension: Secondary | ICD-10-CM

## 2013-11-28 NOTE — Patient Instructions (Signed)
You have signed up for exercise program; just start with low impact aerobics and work at your own pace. Work on Occupational psychologist and staying active. Take your medication everyday.

## 2013-11-29 NOTE — Progress Notes (Signed)
S:  This 53 y.o. AA female returns today for HTN follow-up. Pt seen on 11/24/2013 at [facility] w/ elevated BP and mildly symptomatic. Amlodipine 5 mg 1 tablet daily added to HCTZ  25 mg 1 tab daily and Atenolol 50 mg 1 tablet daily. Pt feels better and is asymptomatic. She denies fatigue, diaphoresis, vision disturbances, CP or tightness, palpitations, SOB or DOE, HA, dizziness, numbness weakness or syncope. BP readings at home are improved. Pt has not started fitness program at gym due to uncontrolled BP.  Patient Active Problem List   Diagnosis Date Noted  . Hypertension 09/11/2012  . Overweight (BMI 25.0-29.9) 09/11/2012  . Migraine, unspecified, without mention of intractable migraine without mention of status migrainosus 09/11/2012  . Hx of thyroid nodule 09/11/2012  . History of tobacco use 09/11/2012    Outpatient Encounter Prescriptions as of 11/28/2013  Medication Sig  . amLODipine (NORVASC) 5 MG tablet Take 1 tablet (5 mg total) by mouth daily.  Marland Kitchen atenolol (TENORMIN) 50 MG tablet Take 1 tablet (50 mg total) by mouth daily.  . hydrochlorothiazide (HYDRODIURIL) 25 MG tablet Take 1 tablet (25 mg total) by mouth daily.    Allergies  Allergen Reactions  . Aspirin   . Penicillins Hives    PMHx, Surg Hx, Soc and Fam Hx reviewed.  ROS: As per HPI.  O: Filed Vitals:   11/28/13 0917  BP: 143/90  Pulse: 70  Temp: 97.9 F (36.6 C)  Resp: 16   GEN: In NAD: WN,WD. HENT: [place]/AT. EOMI w/ clear conj/sclerae. Otherwise unremarkable. COR: RRR. No edema. LUNGS: Normal resp rate and effort. SKIN: W&D; intact w/o erythema or diaphoresis. NEURO: A&O x 3; CNs intact. Nonfocal.  A/P: Essential hypertension- Improved on current medications w/o adverse effects. Continue home monitoring. RTC in 4 weeks.

## 2013-12-30 ENCOUNTER — Ambulatory Visit (INDEPENDENT_AMBULATORY_CARE_PROVIDER_SITE_OTHER): Payer: Managed Care, Other (non HMO) | Admitting: Family Medicine

## 2013-12-30 ENCOUNTER — Encounter: Payer: Self-pay | Admitting: Family Medicine

## 2013-12-30 VITALS — BP 156/92 | HR 86 | Temp 98.0°F | Resp 16 | Ht 67.0 in | Wt 192.4 lb

## 2013-12-30 DIAGNOSIS — I10 Essential (primary) hypertension: Secondary | ICD-10-CM

## 2013-12-30 NOTE — Patient Instructions (Signed)
Mediterranean Diet  Why follow it? Research shows.   Those who follow the Mediterranean diet have a reduced risk of heart disease    The diet is associated with a reduced incidence of Parkinson's and Alzheimer's diseases   People following the diet may have longer life expectancies and lower rates of chronic diseases    The Dietary Guidelines for Americans recommends the Mediterranean diet as an eating plan to promote health and prevent disease  What Is the Mediterranean Diet?    Healthy eating plan based on typical foods and recipes of Mediterranean-style cooking   The diet is primarily a plant based diet; these foods should make up a majority of meals   Starches - Plant based foods should make up a majority of meals - They are an important sources of vitamins, minerals, energy, antioxidants, and fiber - Choose whole grains, foods high in fiber and minimally processed items  - Typical grain sources include wheat, oats, barley, corn, brown rice, bulgar, farro, millet, polenta, couscous  - Various types of beans include chickpeas, lentils, fava beans, black beans, white beans   Fruits  Veggies - Large quantities of antioxidant rich fruits & veggies; 6 or more servings  - Vegetables can be eaten raw or lightly drizzled with oil and cooked  - Vegetables common to the traditional Mediterranean Diet include: artichokes, arugula, beets, broccoli, brussel sprouts, cabbage, carrots, celery, collard greens, cucumbers, eggplant, kale, leeks, lemons, lettuce, mushrooms, okra, onions, peas, peppers, potatoes, pumpkin, radishes, rutabaga, shallots, spinach, sweet potatoes, turnips, zucchini - Fruits common to the Mediterranean Diet include: apples, apricots, avocados, cherries, clementines, dates, figs, grapefruits, grapes, melons, nectarines, oranges, peaches, pears, pomegranates, strawberries, tangerines  Fats - Replace butter and margarine with healthy oils, such as olive oil, canola oil, and tahini   - Limit nuts to no more than a handful a day  - Nuts include walnuts, almonds, pecans, pistachios, pine nuts  - Limit or avoid candied, honey roasted or heavily salted nuts - Olives are central to the Mediterranean diet - can be eaten whole or used in a variety of dishes   Meats Protein - Limiting red meat: no more than a few times a month - When eating red meat: choose lean cuts and keep the portion to the size of deck of cards - Eggs: approx. 0 to 4 times a week  - Fish and lean poultry: at least 2 a week  - Healthy protein sources include, chicken, turkey, lean beef, lamb - Increase intake of seafood such as tuna, salmon, trout, mackerel, shrimp, scallops - Avoid or limit high fat processed meats such as sausage and bacon  Dairy - Include moderate amounts of low fat dairy products  - Focus on healthy dairy such as fat free yogurt, skim milk, low or reduced fat cheese - Limit dairy products higher in fat such as whole or 2% milk, cheese, ice cream  Alcohol - Moderate amounts of red wine is ok  - No more than 5 oz daily for women (all ages) and men older than age 65  - No more than 10 oz of wine daily for men younger than 65  Other - Limit sweets and other desserts  - Use herbs and spices instead of salt to flavor foods  - Herbs and spices common to the traditional Mediterranean Diet include: basil, bay leaves, chives, cloves, cumin, fennel, garlic, lavender, marjoram, mint, oregano, parsley, pepper, rosemary, sage, savory, sumac, tarragon, thyme   It's not just a diet,   it's a lifestyle:    The Mediterranean diet includes lifestyle factors typical of those in the region    Foods, drinks and meals are best eaten with others and savored   Daily physical activity is important for overall good health   This could be strenuous exercise like running and aerobics   This could also be more leisurely activities such as walking, housework, yard-work, or taking the stairs   Moderation is the key;  a balanced and healthy diet accommodates most foods and drinks   Consider portion sizes and frequency of consumption of certain foods   Meal Ideas & Options:    Breakfast:  o Whole wheat toast or whole wheat English muffins with peanut butter & hard boiled egg o Steel cut oats topped with apples & cinnamon and skim milk  o Fresh fruit: banana, strawberries, melon, berries, peaches  o Smoothies: strawberries, bananas, greek yogurt, peanut butter o Low fat greek yogurt with blueberries and granola  o Egg white omelet with spinach and mushrooms o Breakfast couscous: whole wheat couscous, apricots, skim milk, cranberries    Sandwiches:  o Hummus and grilled vegetables (peppers, zucchini, squash) on whole wheat bread   o Grilled chicken on whole wheat pita with lettuce, tomatoes, cucumbers or tzatziki  o Tuna salad on whole wheat bread: tuna salad made with greek yogurt, olives, red peppers, capers, green onions o Garlic rosemary lamb pita: lamb sauted with garlic, rosemary, salt & pepper; add lettuce, cucumber, greek yogurt to pita - flavor with lemon juice and black pepper    Seafood:  o Mediterranean grilled salmon, seasoned with garlic, basil, parsley, lemon juice and black pepper o Shrimp, lemon, and spinach whole-grain pasta salad made with low fat greek yogurt  o Seared scallops with lemon orzo  o Seared tuna steaks seasoned salt, pepper, coriander topped with tomato mixture of olives, tomatoes, olive oil, minced garlic, parsley, green onions and cappers    Meats:  o Herbed greek chicken salad with kalamata olives, cucumber, feta  o Red bell peppers stuffed with spinach, bulgur, lean ground beef (or lentils) & topped with feta   o Kebabs: skewers of chicken, tomatoes, onions, zucchini, squash  o [place] burgers: made with red onions, mint, dill, lemon juice, feta cheese topped with roasted red peppers   Vegetarian o Cucumber salad: cucumbers, artichoke hearts, celery, red onion, feta  cheese, tossed in olive oil & lemon juice  o Hummus and whole grain pita points with a greek salad (lettuce, tomato, feta, olives, cucumbers, red onion) o Lentil soup with celery, carrots made with vegetable broth, garlic, salt and pepper  o Tabouli salad: parsley, bulgur, mint, scallions, cucumbers, tomato, radishes, lemon juice, olive oil, salt and pepper.    You need to try to consume 1000 calories per day in order to get your metabolism more efficient.

## 2013-12-30 NOTE — Progress Notes (Signed)
S:  This 53 y.o. AA female has HTN; she is compliant w/ medications and home readings are 120-130/70-80. She denies diaphoresis, vision disturbances, fatigue, CP or tightness, palpitations, SOB or DOE, edema, HA, dizziness, numbness, weakness or syncope. She has some stressors in the home with adult children, especially her 26 y.o. son. No medication adverse effects reported.  Patient Active Problem List   Diagnosis Date Noted  . Hypertension 09/11/2012  . Overweight (BMI 25.0-29.9) 09/11/2012  . Migraine, unspecified, without mention of intractable migraine without mention of status migrainosus 09/11/2012  . Hx of thyroid nodule 09/11/2012  . History of tobacco use 09/11/2012    Prior to Admission medications   Medication Sig Start Date End Date Taking? Authorizing Provider  amLODipine (NORVASC) 5 MG tablet Take 1 tablet (5 mg total) by mouth daily. 11/24/13  Yes Wardell Honour, MD  atenolol (TENORMIN) 50 MG tablet Take 1 tablet (50 mg total) by mouth daily. 11/21/13  Yes Barton Fanny, MD  hydrochlorothiazide (HYDRODIURIL) 25 MG tablet Take 1 tablet (25 mg total) by mouth daily. 11/21/13  Yes Barton Fanny, MD    History   Social History  . Marital Status: Married    Spouse Name: N/A    Number of Children: N/A  . Years of Education: N/A   Occupational History  . real estate agent    Social History Main Topics  . Smoking status: Former Research scientist (life sciences)  . Smokeless tobacco: Not on file  . Alcohol Use: No  . Drug Use: No  . Sexual Activity: No   Other Topics Concern  . Not on file   Social History Narrative   married    ROS: As per HPI.  O: Filed Vitals:   12/30/13 1548  BP: 156/92  Pulse: 86  Temp: 98 F (36.7 C)  Resp: 16   GEN: in NAD; WN,WD. HENT: [place]/AT; EOMI w/ clear conj/sclerae. Otherwise unremarkable. COR: RRR. LUNGS: Unlabored resp. SKIN: W&D; intact. MS: MAEs; no c/c/e. NEURO: A&O x 3; CNs intact. Nonfocal.  A/P: Essential hypertension- Continue  current medications.

## 2014-03-13 ENCOUNTER — Encounter: Payer: Managed Care, Other (non HMO) | Admitting: Family Medicine

## 2014-03-18 ENCOUNTER — Other Ambulatory Visit: Payer: Self-pay | Admitting: Family Medicine

## 2014-03-27 ENCOUNTER — Encounter: Payer: Self-pay | Admitting: Family Medicine

## 2014-03-27 ENCOUNTER — Ambulatory Visit (INDEPENDENT_AMBULATORY_CARE_PROVIDER_SITE_OTHER): Payer: Managed Care, Other (non HMO) | Admitting: Family Medicine

## 2014-03-27 VITALS — BP 142/92 | HR 66 | Temp 98.0°F | Resp 16 | Ht 66.5 in | Wt 194.0 lb

## 2014-03-27 DIAGNOSIS — N951 Menopausal and female climacteric states: Secondary | ICD-10-CM

## 2014-03-27 DIAGNOSIS — Z0000 Encounter for general adult medical examination without abnormal findings: Secondary | ICD-10-CM

## 2014-03-27 LAB — LIPID PANEL
CHOLESTEROL: 216 mg/dL — AB (ref 0–200)
HDL: 46 mg/dL (ref >39)
LDL CALC: 134 mg/dL — AB (ref 0–99)
TRIGLYCERIDES: 178 mg/dL — AB (ref <150)
Total CHOL/HDL Ratio: 4.7 Ratio
VLDL: 36 mg/dL (ref 0–40)

## 2014-03-27 LAB — BASIC METABOLIC PANEL WITH GFR
BUN: 13 mg/dL (ref 6–23)
CO2: 29 mEq/L (ref 19–32)
CREATININE: 0.79 mg/dL (ref 0.50–1.10)
Calcium: 10.1 mg/dL (ref 8.4–10.5)
Chloride: 98 mEq/L (ref 96–112)
GFR, Est Non African American: 87 mL/min
Glucose, Bld: 114 mg/dL — ABNORMAL HIGH (ref 70–99)
Potassium: 3.6 mEq/L (ref 3.5–5.3)
SODIUM: 139 meq/L (ref 135–145)

## 2014-03-27 LAB — HEPATITIS C ANTIBODY: HCV Ab: NEGATIVE

## 2014-03-27 MED ORDER — AMLODIPINE BESYLATE 5 MG PO TABS: 5.0000 mg | ORAL_TABLET | Freq: Every day | ORAL | Status: DC

## 2014-03-27 MED ORDER — HYDROCHLOROTHIAZIDE 25 MG PO TABS: 25.0000 mg | ORAL_TABLET | Freq: Every day | ORAL | Status: DC

## 2014-03-27 MED ORDER — ATENOLOL 50 MG PO TABS: 50.0000 mg | ORAL_TABLET | Freq: Every day | ORAL | Status: DC

## 2014-03-27 NOTE — Progress Notes (Signed)
Subjective:    Patient ID: Autumn Cook, female    DOB: Aug 19, 1960, 53 y.o.   MRN: [id number]  HPI  This 53 y.o. AA female is here for Wellness exam; GYN care provided by specialty practice. She has well controlled HTN w/o adverse medication effects. Pt has recently started fitness plan by joining [employer]. She c/o peri-menopause symptoms; GYN physician has been checking hormone levels annually.  HCM: MMG- Current; pt to schedule (she has dense large breasts).           PAP- per GYN.           IMM- Declines Flu vaccine.           Vision- Annually.           Dental- Current.  Patient Active Problem List   Diagnosis Date Noted  . Hypertension 09/11/2012  . Overweight (BMI 25.0-29.9) 09/11/2012  . Migraine, unspecified, without mention of intractable migraine without mention of status migrainosus 09/11/2012  . Hx of thyroid nodule 09/11/2012  . History of tobacco use 09/11/2012    Prior to Admission medications   Medication Sig Start Date End Date Taking? Authorizing Provider  amLODipine (NORVASC) 5 MG tablet Take 1 tablet (5 mg total) by mouth daily.   Yes Barton Fanny, MD  atenolol (TENORMIN) 50 MG tablet Take 1 tablet (50 mg total) by mouth daily.   Yes Barton Fanny, MD  hydrochlorothiazide (HYDRODIURIL) 25 MG tablet Take 1 tablet (25 mg total) by mouth daily.   Yes Barton Fanny, MD    History   Social History  . Marital Status: Married    Spouse Name: N/A    Number of Children: N/A  . Years of Education: N/A   Occupational History  . real estate agent    Social History Main Topics  . Smoking status: Former Research scientist (life sciences)  . Smokeless tobacco: Not on file  . Alcohol Use: No  . Drug Use: No  . Sexual Activity: No   Other Topics Concern  . Not on file   Social History Narrative   married    Family History  Problem Relation Age of Onset  . Hypertension Father   . Cancer Father 58    lung cancer  . Stroke Father   . Heart disease Mother       arrhythmia  . Hypertension Mother   . Kidney failure Sister     Review of Systems  Constitutional: Positive for diaphoresis.  HENT: Negative.   Eyes: Positive for pain.  Respiratory: Positive for shortness of breath.   Cardiovascular: Negative.   Gastrointestinal: Negative.   Endocrine: Negative.   Genitourinary: Positive for menstrual problem and pelvic pain.  Musculoskeletal: Positive for back pain.  Skin: Negative.   Allergic/Immunologic: Negative.   Neurological: Positive for light-headedness.  Hematological: Negative.   Psychiatric/Behavioral: Negative.       Objective:   Physical Exam  Constitutional: She is oriented to person, place, and time. Vital signs are normal. She appears well-developed and well-nourished. No distress.  HENT:  Head: Normocephalic and atraumatic.  Right Ear: Hearing, tympanic membrane, external ear and ear canal normal.  Left Ear: Hearing, tympanic membrane, external ear and ear canal normal.  Nose: Mucosal edema present. No nasal deformity or septal deviation.  Mouth/Throat: Uvula is midline, oropharynx is clear and moist and mucous membranes are normal. No oral lesions. No dental caries.  Eyes: Conjunctivae, EOM and lids are normal. Pupils are equal, round, and reactive  to light. No scleral icterus.  Fundoscopic exam:      The right eye shows no papilledema. The right eye shows red reflex.       The left eye shows no papilledema. The left eye shows red reflex.  Neck: Trachea normal, normal range of motion, full passive range of motion without pain and phonation normal. Neck supple. No JVD present. No spinous process tenderness and no muscular tenderness present. No thyroid mass and no thyromegaly present.  Cardiovascular: Normal rate, regular rhythm, S1 normal, S2 normal, normal heart sounds, intact distal pulses and normal pulses.   No extrasystoles are present. PMI is not displaced.  Exam reveals no gallop and no friction rub.   No murmur  heard. Pulmonary/Chest: Effort normal and breath sounds normal. No respiratory distress. Right breast exhibits no inverted nipple, no mass, no nipple discharge, no skin change and no tenderness. Left breast exhibits no inverted nipple, no mass, no nipple discharge, no skin change and no tenderness. Breasts are symmetrical.  Abdominal: Soft. Normal appearance, normal aorta and bowel sounds are normal. She exhibits no distension, no abdominal bruit, no pulsatile midline mass and no mass. There is no hepatosplenomegaly. There is no tenderness. There is no rebound, no guarding and no CVA tenderness.  Genitourinary:  Deferred.  Musculoskeletal:       Cervical back: Normal.       Thoracic back: Normal.       Lumbar back: Normal.  Remainder of exam unremarkable.  Lymphadenopathy:       Head (right side): No submental, no submandibular, no tonsillar, no preauricular, no posterior auricular and no occipital adenopathy present.       Head (left side): No submental, no submandibular, no tonsillar, no preauricular, no posterior auricular and no occipital adenopathy present.    She has no cervical adenopathy.    She has no axillary adenopathy.       Right: No inguinal and no supraclavicular adenopathy present.       Left: No inguinal and no supraclavicular adenopathy present.  Neurological: She is alert and oriented to person, place, and time. She has normal strength and normal reflexes. She displays no atrophy and no tremor. No cranial nerve deficit or sensory deficit. She exhibits normal muscle tone. She displays a negative Romberg sign. Coordination and gait normal.  Skin: Skin is warm, dry and intact. No ecchymosis, no lesion and no rash noted. She is not diaphoretic. No cyanosis or erythema. Nails show no clubbing.  Psychiatric: She has a normal mood and affect. Her speech is normal and behavior is normal. Judgment and thought content normal. Cognition and memory are normal.  Nursing note and vitals  reviewed.      Assessment & Plan:  Well female exam without gynecological exam - Plan: IFOBT POC (occult bld, rslt in office), BASIC METABOLIC PANEL WITH GFR, Lipid panel, Hepatitis C antibody  Menopause syndrome - Advised daily flaxseed supplement; also may find a menopause support supplement at [facility]. Regular physical activity helps reduce menopause symptoms. Plan: BASIC METABOLIC PANEL WITH GFR  Meds ordered this encounter  Medications  . hydrochlorothiazide (HYDRODIURIL) 25 MG tablet    Sig: Take 1 tablet (25 mg total) by mouth daily.    Dispense:  30 tablet    Refill:  11  . amLODipine (NORVASC) 5 MG tablet    Sig: Take 1 tablet (5 mg total) by mouth daily.    Dispense:  30 tablet    Refill:  11  .  atenolol (TENORMIN) 50 MG tablet    Sig: Take 1 tablet (50 mg total) by mouth daily.    Dispense:  30 tablet    Refill:  11

## 2014-03-27 NOTE — Patient Instructions (Addendum)
Keeping You Healthy  Get These Tests  Blood Pressure- Have your blood pressure checked by your healthcare provider at least once a year.  Normal blood pressure is 120/80.  Weight- Have your body mass index (BMI) calculated to screen for obesity.  BMI is a measure of body fat based on height and weight.  You can calculate your own BMI at www.nhlbisupport.com/bmi/  Cholesterol- Have your cholesterol checked every year.  Diabetes- Have your blood sugar checked every year if you have high blood pressure, high cholesterol, a family history of diabetes or if you are overweight.  Pap Smear- Have a pap smear every 1 to 3 years if you have been sexually active.  If you are older than 65 and recent pap smears have been normal you may not need additional pap smears.  In addition, if you have had a hysterectomy  For benign disease additional pap smears are not necessary.  Mammogram-Yearly mammograms are essential for early detection of breast cancer  Screening for Colon Cancer- Colonoscopy starting at age 50. Screening may begin sooner depending on your family history and other health conditions.  Follow up colonoscopy as directed by your Gastroenterologist.  Screening for Osteoporosis- Screening begins at age 65 with bone density scanning, sooner if you are at higher risk for developing Osteoporosis.  Get these medicines  Calcium with Vitamin D- Your body requires 1200-1500 mg of Calcium a day and 800-1000 IU of Vitamin D a day.  You can only absorb 500 mg of Calcium at a time therefore Calcium must be taken in 2 or 3 separate doses throughout the day.  Hormones- Hormone therapy has been associated with increased risk for certain cancers and heart disease.  Talk to your healthcare provider about if you need relief from menopausal symptoms.  Aspirin- Ask your healthcare provider about taking Aspirin to prevent Heart Disease and Stroke.  Get these Immuniztions  Flu shot- Every fall  Pneumonia  shot- Once after the age of 65; if you are younger ask your healthcare provider if you need a pneumonia shot.  Tetanus- Every ten years.  Zostavax- Once after the age of 60 to prevent shingles.  Take these steps  Don't smoke- Your healthcare provider can help you quit. For tips on how to quit, ask your healthcare provider or go to www.smokefree.gov or call 1-800 QUIT-NOW.  Be physically active- Exercise 5 days a week for a minimum of 30 minutes.  If you are not already physically active, start slow and gradually work up to 30 minutes of moderate physical activity.  Try walking, dancing, bike riding, swimming, etc.  Eat a healthy diet- Eat a variety of healthy foods such as fruits, vegetables, whole grains, low fat milk, low fat cheeses, yogurt, lean meats, chicken, fish, eggs, dried beans, tofu, etc.  For more information go to www.thenutritionsource.org  Dental visit- Brush and floss teeth twice daily; visit your dentist twice a year.  Eye exam- Visit your Optometrist or Ophthalmologist yearly.  Drink alcohol in moderation- Limit alcohol intake to one drink or less a day.  Never drink and drive.  Depression- Your emotional health is as important as your physical health.  If you're feeling down or losing interest in things you normally enjoy, please talk to your healthcare provider.  Seat Belts- can save your life; always wear one  Smoke/Carbon Monoxide detectors- These detectors need to be installed on the appropriate level of your home.  Replace batteries at least once a year.  Violence- If anyone   is threatening or hurting you, please tell your healthcare provider.  Living Will/ Health care power of attorney- Discuss with your healthcare provider and family.   For Menopause symptoms: [facility] on [address] has a section of supplements for "menopause support"- they are combination of herbal remedies that help reduce symptoms. Also daily Flaxseed and regular fitness  routine can help reduce menopause symptoms.  Menopause Menopause is the normal time of life when menstrual periods stop completely. Menopause is complete when you have missed 12 consecutive menstrual periods. It usually occurs between the ages of 31 years and 68 years. Very rarely does a woman develop menopause before the age of 71 years. At menopause, your ovaries stop producing the female hormones estrogen and progesterone. This can cause undesirable symptoms and also affect your health. Sometimes the symptoms may occur 4-5 years before the menopause begins. There is no relationship between menopause and:  Oral contraceptives.  Number of children you had.  Race.  The age your menstrual periods started (menarche). Heavy smokers and very thin women may develop menopause earlier in life. CAUSES  The ovaries stop producing the female hormones estrogen and progesterone.  Other causes include:  Surgery to remove both ovaries.  The ovaries stop functioning for no known reason.  Tumors of the pituitary gland in the brain.  Medical disease that affects the ovaries and hormone production.  Radiation treatment to the abdomen or pelvis.  Chemotherapy that affects the ovaries. SYMPTOMS   Hot flashes.  Night sweats.  Decrease in sex drive.  Vaginal dryness and thinning of the vagina causing painful intercourse.  Dryness of the skin and developing wrinkles.  Headaches.  Tiredness.  Irritability.  Memory problems.  Weight gain.  Bladder infections.  Hair growth of the face and chest.  Infertility. More serious symptoms include:  Loss of bone (osteoporosis) causing breaks (fractures).  Depression.  Hardening and narrowing of the arteries (atherosclerosis) causing heart attacks and strokes. DIAGNOSIS   When the menstrual periods have stopped for 12 straight months.  Physical exam.  Hormone studies of the blood. TREATMENT  There are many treatment choices and  nearly as many questions about them. The decisions to treat or not to treat menopausal changes is an individual choice made with your health care provider. Your health care provider can discuss the treatments with you. Together, you can decide which treatment will work best for you. Your treatment choices may include:   Hormone therapy (estrogen and progesterone).  Non-hormonal medicines.  Treating the individual symptoms with medicine (for example antidepressants for depression).  Herbal medicines that may help specific symptoms.  Counseling by a psychiatrist or psychologist.  Group therapy.  Lifestyle changes including:  Eating healthy.  Regular exercise.  Limiting caffeine and alcohol.  Stress management and meditation.  No treatment. HOME CARE INSTRUCTIONS   Take the medicine your health care provider gives you as directed.  Get plenty of sleep and rest.  Exercise regularly.  Eat a diet that contains calcium (good for the bones) and soy products (acts like estrogen hormone).  Avoid alcoholic beverages.  Do not smoke.  If you have hot flashes, dress in layers.  Take supplements, calcium, and vitamin D to strengthen bones.  You can use over-the-counter lubricants or moisturizers for vaginal dryness.  Group therapy is sometimes very helpful.  Acupuncture may be helpful in some cases. SEEK MEDICAL CARE IF:   You are not sure you are in menopause.  You are having menopausal symptoms and  need advice and treatment.  You are still having menstrual periods after age 68 years.  You have pain with intercourse.  Menopause is complete (no menstrual period for 12 months) and you develop vaginal bleeding.  You need a referral to a specialist (gynecologist, psychiatrist, or psychologist) for treatment. SEEK IMMEDIATE MEDICAL CARE IF:   You have severe depression.  You have excessive vaginal bleeding.  You fell and think you have a broken bone.  You have pain  when you urinate.  You develop leg or chest pain.  You have a fast pounding heart beat (palpitations).  You have severe headaches.  You develop vision problems.  You feel a lump in your breast.  You have abdominal pain or severe indigestion. Document Released: 07/15/2003 Document Revised: 12/25/2012 Document Reviewed: 11/21/2012 [facility] Patient Information 2015 [place], [place]. This information is not intended to replace advice given to you by your health care provider. Make sure you discuss any questions you have with your health care provider.

## 2014-03-29 ENCOUNTER — Encounter: Payer: Self-pay | Admitting: Family Medicine

## 2014-03-29 NOTE — Progress Notes (Signed)
Quick Note:  Please advise pt regarding following labs... Blood sugar is mildly elevated. Focus on healthier lifestyle with better nutrition and regular physical activity.  This will help improve your lipid profile; total and LDL ("bad") cholesterol are above normal. Triglycerides are above normal also. Your risk of heart disease is average based on these numbers.  Hepatitis C antibody test is negative.  Contact the clinic if you have questions or concerns. ______

## 2014-08-06 ENCOUNTER — Ambulatory Visit (INDEPENDENT_AMBULATORY_CARE_PROVIDER_SITE_OTHER): Payer: Managed Care, Other (non HMO) | Admitting: Family Medicine

## 2014-08-06 ENCOUNTER — Encounter (HOSPITAL_COMMUNITY): Payer: Self-pay | Admitting: Physical Medicine and Rehabilitation

## 2014-08-06 ENCOUNTER — Emergency Department (HOSPITAL_COMMUNITY): Payer: Managed Care, Other (non HMO)

## 2014-08-06 ENCOUNTER — Encounter: Payer: Self-pay | Admitting: Family Medicine

## 2014-08-06 ENCOUNTER — Emergency Department (HOSPITAL_COMMUNITY)
Admission: EM | Admit: 2014-08-06 | Discharge: 2014-08-06 | Disposition: A | Discharge status: Home / Self Care | Payer: Managed Care, Other (non HMO) | Attending: Emergency Medicine | Admitting: Emergency Medicine

## 2014-08-06 VITALS — BP 170/90 | HR 84 | Temp 98.7°F | Resp 16 | Ht 65.5 in | Wt 185.0 lb

## 2014-08-06 DIAGNOSIS — M25472 Effusion, left ankle: Secondary | ICD-10-CM | POA: Insufficient documentation

## 2014-08-06 DIAGNOSIS — M546 Pain in thoracic spine: Secondary | ICD-10-CM | POA: Diagnosis not present

## 2014-08-06 DIAGNOSIS — R0602 Shortness of breath: Secondary | ICD-10-CM | POA: Insufficient documentation

## 2014-08-06 DIAGNOSIS — G43909 Migraine, unspecified, not intractable, without status migrainosus: Secondary | ICD-10-CM | POA: Insufficient documentation

## 2014-08-06 DIAGNOSIS — R0789 Other chest pain: Secondary | ICD-10-CM

## 2014-08-06 DIAGNOSIS — Z79899 Other long term (current) drug therapy: Secondary | ICD-10-CM | POA: Diagnosis not present

## 2014-08-06 DIAGNOSIS — R079 Chest pain, unspecified: Secondary | ICD-10-CM | POA: Diagnosis present

## 2014-08-06 DIAGNOSIS — Z87891 Personal history of nicotine dependence: Secondary | ICD-10-CM | POA: Insufficient documentation

## 2014-08-06 DIAGNOSIS — M79602 Pain in left arm: Secondary | ICD-10-CM | POA: Diagnosis not present

## 2014-08-06 DIAGNOSIS — I10 Essential (primary) hypertension: Secondary | ICD-10-CM | POA: Diagnosis not present

## 2014-08-06 DIAGNOSIS — Z880 Allergy status to penicillin: Secondary | ICD-10-CM | POA: Insufficient documentation

## 2014-08-06 LAB — CBC
HCT: 36.9 % (ref 36.0–46.0)
Hemoglobin: 12 g/dL (ref 12.0–15.0)
MCH: 28.5 pg (ref 26.0–34.0)
MCHC: 32.5 g/dL (ref 30.0–36.0)
MCV: 87.6 fL (ref 78.0–100.0)
Platelets: 334 10*3/uL (ref 150–400)
RBC: 4.21 MIL/uL (ref 3.87–5.11)
RDW: 13.9 % (ref 11.5–15.5)
WBC: 6.1 10*3/uL (ref 4.0–10.5)

## 2014-08-06 LAB — BASIC METABOLIC PANEL
Anion gap: 11 (ref 5–15)
BUN: 10 mg/dL (ref 6–23)
CO2: 29 mmol/L (ref 19–32)
Calcium: 9.8 mg/dL (ref 8.4–10.5)
Chloride: 102 mmol/L (ref 96–112)
Creatinine, Ser: 0.86 mg/dL (ref 0.50–1.10)
GFR calc Af Amer: 88 mL/min — ABNORMAL LOW (ref >90)
GFR calc non Af Amer: 76 mL/min — ABNORMAL LOW (ref >90)
Glucose, Bld: 103 mg/dL — ABNORMAL HIGH (ref 70–99)
Potassium: 3.6 mmol/L (ref 3.5–5.1)
Sodium: 142 mmol/L (ref 135–145)

## 2014-08-06 LAB — D-DIMER, QUANTITATIVE (NOT AT ARMC): D-Dimer, Quant: <0.27 ug/mL-FEU (ref 0.00–0.48)

## 2014-08-06 LAB — I-STAT TROPONIN, ED
Troponin i, poc: 0 ng/mL (ref 0.00–0.08)
Troponin i, poc: 0 ng/mL (ref 0.00–0.08)

## 2014-08-06 MED ORDER — NITROGLYCERIN 0.4 MG SL SUBL
0.4000 mg | SUBLINGUAL_TABLET | Freq: Once | SUBLINGUAL | Status: AC
  Administered 2014-08-06: 0.4 mg via SUBLINGUAL

## 2014-08-06 MED ORDER — TRAMADOL HCL 50 MG PO TABS: 50.0000 mg | ORAL_TABLET | Freq: Four times a day (QID) | ORAL | Status: DC | PRN

## 2014-08-06 MED ORDER — ASPIRIN 81 MG PO CHEW
324.0000 mg | CHEWABLE_TABLET | Freq: Once | ORAL | Status: AC
  Administered 2014-08-06: 324 mg via ORAL

## 2014-08-06 MED ORDER — ACETAMINOPHEN 500 MG PO TABS
1000.0000 mg | ORAL_TABLET | Freq: Once | ORAL | Status: AC
  Administered 2014-08-06: 1000 mg via ORAL
  Filled 2014-08-06: qty 2

## 2014-08-06 NOTE — ED Notes (Signed)
Pain still worse with movement.

## 2014-08-06 NOTE — ED Provider Notes (Signed)
CSN: [id number]     Arrival date & time 08/06/14  3220 History   First MD Initiated Contact with Patient 08/06/14 [phone]     Chief Complaint  Patient presents with  . Chest Pain     (Consider location/radiation/quality/duration/timing/severity/associated sxs/prior Treatment) HPI  Pt is a 54yo female with hx of HTN, arthritis, and migraines, brought to ED from urgent care for further evaluation of left sided chest pain, concern for ACS.  Pt states symptoms started earlier this morning while she was walking her dog.  She noticed sudden onset left sided chest pain that radiated to her back and left arm.  Pain is sharp and cramping, 10/10 initially. Associated SOB, nausea and possible diaphoresis.  Pt drove herself to urgent care who gave pt 324mg  ASA and 2 sublingual nitro which initially provided no relief, however, pt states pain is now 6/10, "uncomfortable" feeling.  Pt states pain is worse with palpation and movement. No previous hx of CAD. Denies FH of CAD. No hx of DM or high cholesterol.  No heavy lifting or recent injuries. Per medical records from urgent care, pt denies leg pain or swelling, however, in ED, pt does recall her left ankle along the medial aspect being aching and sore with mild edema intermittently for the last few days. Denies known injury. Denies leg pain at this time.  No previous hx of blood clots.    Past Medical History  Diagnosis Date  . Allergy   . Arthritis   . Migraine   . Hypertension    History reviewed. No pertinent past surgical history. Family History  Problem Relation Age of Onset  . Hypertension Father   . Cancer Father 28    lung cancer  . Stroke Father   . Heart disease Mother     arrhythmia  . Hypertension Mother   . Kidney failure Sister    History  Substance Use Topics  . Smoking status: Former Research scientist (life sciences)  . Smokeless tobacco: Not on file  . Alcohol Use: No   OB History    No data available     Review of Systems  Constitutional: Negative  for chills, diaphoresis and fatigue.  Respiratory: Positive for shortness of breath. Negative for cough.   Cardiovascular: Positive for chest pain and leg swelling ( left ankle). Negative for palpitations.  Gastrointestinal: Positive for nausea. Negative for vomiting, abdominal pain and diarrhea.  Musculoskeletal: Positive for myalgias (left upper back and arm) and arthralgias (left ankle, intermittently. not currently).  All other systems reviewed and are negative.     Allergies  Penicillins  Home Medications   Prior to Admission medications   Medication Sig Start Date End Date Taking? Authorizing Provider  amLODipine (NORVASC) 5 MG tablet Take 1 tablet (5 mg total) by mouth daily. Patient taking differently: Take 5 mg by mouth at bedtime.  03/27/14  Yes Barton Fanny, MD  atenolol (TENORMIN) 50 MG tablet Take 1 tablet (50 mg total) by mouth daily. 03/27/14  Yes Barton Fanny, MD  hydrochlorothiazide (HYDRODIURIL) 25 MG tablet Take 1 tablet (25 mg total) by mouth daily. 03/27/14  Yes Barton Fanny, MD  traMADol (ULTRAM) 50 MG tablet Take 1 tablet (50 mg total) by mouth every 6 (six) hours as needed. 08/06/14   Noland Fordyce, PA-C   BP 140/77 mmHg  Pulse 64  Temp(Src) 98.8 F (37.1 C) (Oral)  Resp 14  SpO2 100%  LMP 10/06/2013 Physical Exam  Constitutional: She appears well-developed and well-nourished. No  distress.  Pt sitting up in exam bed, NAD.  HENT:  Head: Normocephalic and atraumatic.  Eyes: Conjunctivae are normal. No scleral icterus.  Neck: Normal range of motion.  Cardiovascular: Normal rate, regular rhythm, normal heart sounds and intact distal pulses.   Pulmonary/Chest: Effort normal and breath sounds normal. No respiratory distress. She has no wheezes. She has no rales. She exhibits no tenderness.  Abdominal: Soft. Bowel sounds are normal. She exhibits no distension and no mass. There is no tenderness. There is no rebound and no guarding.   Musculoskeletal: Normal range of motion. She exhibits tenderness.  No midline spinal tenderness. Tenderness to Left upper trapezius and left lower thoracic muscles into left flank. FROM upper and lower extremities with 5/5 strength bilaterally. No calf swelling or tenderness. No joint swelling.  Neurological: She is alert.  Skin: Skin is warm and dry. She is not diaphoretic.  Nursing note and vitals reviewed.   ED Course  Procedures (including critical care time) Labs Review Labs Reviewed  BASIC METABOLIC PANEL - Abnormal; Notable for the following:    Glucose, Bld 103 (*)    GFR calc non Af Amer 76 (*)    GFR calc Af Amer 88 (*)    All other components within normal limits  CBC  D-DIMER, QUANTITATIVE  I-STAT TROPOININ, ED  I-STAT TROPOININ, ED    Imaging Review Dg Chest 2 View  08/06/2014   CLINICAL DATA:  Chest pain, shortness of breath for 2 days  EXAM: CHEST  2 VIEW  COMPARISON:  None.  FINDINGS: The heart size and mediastinal contours are within normal limits. Both lungs are clear. The visualized skeletal structures are unremarkable.  IMPRESSION: No active cardiopulmonary disease.   Electronically Signed   By: Kathreen Devoid   On: 08/06/2014 10:50     EKG Interpretation   Date/Time:  Thursday August 06 2014 09:52:55 EDT Ventricular Rate:  57 PR Interval:  157 QRS Duration: 78 QT Interval:  432 QTC Calculation: 421 R Axis:   31 Text Interpretation:  Sinus rhythm Borderline T abnormalities, anterior  leads No prior for comparison Confirmed by Mingo Amber  MD, [place] ([id number]) on  08/06/2014 10:04:49 AM      MDM   Final diagnoses:  Other chest pain  Left arm pain    PT is a 54yo female sent to ED from urgent care with concern for ACS. Pt c/o sudden onset left sided chest pain that radiated into left arm and upper back. No known injury.  Associated SOB, nausea and possible diaphoresis.  Pain did improve from 10/10 initially to 6/10 after given 324mg  ASA and 2 SL nitro at  Urgent Care.  Vitals: BP initially elevated at 170/90, hx of HTN for which she takes atenolol. BP rechecked improved to 128/62.   O2-98% on RA.  Pt appears well, NAD. reproducible upper back and flank tenderness with palpation.   Concern for ACS, low concern for PE or aortic dissection, however, pt did mention intermittent Left ankle edema.  Cardiac labs and imaging ordered.  Pt offered morphine but declined stating pain was "better, just uncomfortable now"  EKG: sinus rhythm with borderline T abnormalities in anterior leads. No prior for comparison.  CXR: no active cardiopulmonary disease istat Troponin: negative for elevation.  D-dimer: WNL CBC and BMP: WNL  2:36 PM delta troponin: negative for elevation.  Discussed results with pt.  Filed Vitals:   08/06/14 1430  BP: 140/77  Pulse: 64  Temp:   Resp: 14  Pt still having left arm pain with movement, reproducible with palpation of left upper trapezius. FROM left arm with 5/5 strength.  Pt states she does not want narcotics.  Feels comfortable with discharge home.    Discussed pt with Dr. Mingo Amber who also examined pt. Pt is safe for discharge home. No evidence of emergent process taking place at this time. Will discharge home to f/u with PCP and cardiology. Return precautions provided. Pt verbalized understanding and agreement with tx plan.    Noland Fordyce, PA-C 08/06/14 1536  Evelina Bucy, MD 08/06/14 [phone]

## 2014-08-06 NOTE — Progress Notes (Signed)
This is a 54 year old woman, married with children, who works in Personal assistant. She developed chest pain while walking her dog approximately 20 minutes prior to arrival at this facility. She describes the chest pain is heavy and persistent in the left chest and left arm throughout.  Patient's had some nausea, shortness of breath as well.  Patient's past medical history significant for hypertension and she takes atenolol. She does not have diabetes, family history of heart disease, leg pain, prior cardiac history, or diabetes, or GERD.  Objective: This is a middle-aged woman in no acute distress is lying calmly on the emergency gurney here at the office. HEENT: Unremarkable Neck: Supple no adenopathy or bruits, no thyromegaly Chest: Clear Heart: Regular with no murmur or gallop Abdomen: Soft nontender Extremities: No edema, no calf tenderness, no rashes noted  EKG shows inverted T waves in V1 through V3.  At 8:30, patient was given 481 mg aspirin and started on 2 L of oxygen. IV was attempted unsuccessfully as the EMTs arrived.  Assessment: This patient has a history consistent with acute coronary syndrome. She will be transferred immediately to [place] for further evaluation and treatment.  Robyn Haber, MD

## 2014-08-06 NOTE — Discharge Instructions (Signed)

## 2014-08-06 NOTE — ED Notes (Signed)
Patient transported to X-ray 

## 2014-08-06 NOTE — ED Notes (Signed)
Pt presents to department via [facility] for evaluation of L sided chest pain radiating to L arm. Also states nausea, diaphoresis and SOB. Was seen at [facility] this morning, received 324 ASA and (2) sublingual nitroglycerin without relief. 8/10 L sided chest pain upon arrival, increases with movement and palpation. 20g L hand. Pt is alert and oriented x4.

## 2014-08-06 NOTE — ED Notes (Signed)
CALLED RAD. TO INFORM PT READY FOR XRAY.

## 2014-08-13 ENCOUNTER — Ambulatory Visit (INDEPENDENT_AMBULATORY_CARE_PROVIDER_SITE_OTHER): Payer: Managed Care, Other (non HMO)

## 2014-08-13 ENCOUNTER — Ambulatory Visit (INDEPENDENT_AMBULATORY_CARE_PROVIDER_SITE_OTHER): Payer: Managed Care, Other (non HMO) | Admitting: Family Medicine

## 2014-08-13 ENCOUNTER — Encounter: Payer: Self-pay | Admitting: Family Medicine

## 2014-08-13 VITALS — BP 140/90 | HR 63 | Temp 98.1°F | Resp 16 | Ht 67.0 in | Wt 196.2 lb

## 2014-08-13 DIAGNOSIS — I10 Essential (primary) hypertension: Secondary | ICD-10-CM | POA: Diagnosis not present

## 2014-08-13 DIAGNOSIS — M546 Pain in thoracic spine: Secondary | ICD-10-CM | POA: Diagnosis not present

## 2014-08-13 DIAGNOSIS — M4120 Other idiopathic scoliosis, site unspecified: Secondary | ICD-10-CM | POA: Diagnosis not present

## 2014-08-13 DIAGNOSIS — R0789 Other chest pain: Secondary | ICD-10-CM | POA: Diagnosis not present

## 2014-08-13 NOTE — Patient Instructions (Addendum)
I have looked at your chest xray and it looks normal though your heart shadow looks enlarged. When you get the name of the cardiologist that you want to see, call the office with that information. If I have not heard from you by Tuesday, I will refer you to the cardiologist that is part of [facility]. They are all very good.       Mediterranean Diet  Why follow it? Research shows. . Those who follow the Mediterranean diet have a reduced risk of heart disease  . The diet is associated with a reduced incidence of Parkinson's and Alzheimer's diseases . People following the diet may have longer life expectancies and lower rates of chronic diseases  . The Dietary Guidelines for Americans recommends the Mediterranean diet as an eating plan to promote health and prevent disease  What Is the Mediterranean Diet?  . Healthy eating plan based on typical foods and recipes of Mediterranean-style cooking . The diet is primarily a plant based diet; these foods should make up a majority of meals   Starches - Plant based foods should make up a majority of meals - They are an important sources of vitamins, minerals, energy, antioxidants, and fiber - Choose whole grains, foods high in fiber and minimally processed items  - Typical grain sources include wheat, oats, barley, corn, brown rice, bulgar, farro, millet, polenta, couscous  - Various types of beans include chickpeas, lentils, fava beans, black beans, white beans   Fruits  Veggies - Large quantities of antioxidant rich fruits & veggies; 6 or more servings  - Vegetables can be eaten raw or lightly drizzled with oil and cooked  - Vegetables common to the traditional Mediterranean Diet include: artichokes, arugula, beets, broccoli, brussel sprouts, cabbage, carrots, celery, collard greens, cucumbers, eggplant, kale, leeks, lemons, lettuce, mushrooms, okra, onions, peas, peppers, potatoes, pumpkin, radishes, rutabaga, shallots, spinach, sweet potatoes,  turnips, zucchini - Fruits common to the Mediterranean Diet include: apples, apricots, avocados, cherries, clementines, dates, figs, grapefruits, grapes, melons, nectarines, oranges, peaches, pears, pomegranates, strawberries, tangerines  Fats - Replace butter and margarine with healthy oils, such as olive oil, canola oil, and tahini  - Limit nuts to no more than a handful a day  - Nuts include walnuts, almonds, pecans, pistachios, pine nuts  - Limit or avoid candied, honey roasted or heavily salted nuts - Olives are central to the [employer] - can be eaten whole or used in a variety of dishes   Meats Protein - Limiting red meat: no more than a few times a month - When eating red meat: choose lean cuts and keep the portion to the size of deck of cards - Eggs: approx. 0 to 4 times a week  - Fish and lean poultry: at least 2 a week  - Healthy protein sources include, chicken, [place], lean beef, lamb - Increase intake of seafood such as tuna, salmon, trout, mackerel, shrimp, scallops - Avoid or limit high fat processed meats such as sausage and bacon  Dairy - Include moderate amounts of low fat dairy products  - Focus on healthy dairy such as fat free yogurt, skim milk, low or reduced fat cheese - Limit dairy products higher in fat such as whole or 2% milk, cheese, ice cream  Alcohol - Moderate amounts of red wine is ok  - No more than 5 oz daily for women (all ages) and men older than age 43  - No more than 10 oz of wine daily for  men younger than 17  Other - Limit sweets and other desserts  - Use herbs and spices instead of salt to flavor foods  - Herbs and spices common to the traditional Mediterranean Diet include: basil, bay leaves, chives, cloves, cumin, fennel, garlic, lavender, marjoram, mint, oregano, parsley, pepper, rosemary, sage, savory, sumac, tarragon, thyme   It's not just a diet, it's a lifestyle:  . The Mediterranean diet includes lifestyle factors typical of those in  the region  . Foods, drinks and meals are best eaten with others and savored . Daily physical activity is important for overall good health . This could be strenuous exercise like running and aerobics . This could also be more leisurely activities such as walking, housework, yard-work, or taking the stairs . Moderation is the key; a balanced and healthy diet accommodates most foods and drinks . Consider portion sizes and frequency of consumption of certain foods   Meal Ideas & Options:  . Breakfast:  o Whole wheat toast or whole wheat English muffins with peanut butter & hard boiled egg o Steel cut oats topped with apples & cinnamon and skim milk  o Fresh fruit: banana, strawberries, melon, berries, peaches  o Smoothies: strawberries, bananas, greek yogurt, peanut butter o Low fat greek yogurt with blueberries and granola  o Egg white omelet with spinach and mushrooms o Breakfast couscous: whole wheat couscous, apricots, skim milk, cranberries  . Sandwiches:  o Hummus and grilled vegetables (peppers, zucchini, squash) on whole wheat bread   o Grilled chicken on whole wheat pita with lettuce, tomatoes, cucumbers or tzatziki  o Tuna salad on whole wheat bread: tuna salad made with greek yogurt, olives, red peppers, capers, green onions o Garlic rosemary lamb pita: lamb sauted with garlic, rosemary, salt & pepper; add lettuce, cucumber, greek yogurt to pita - flavor with lemon juice and black pepper  . Seafood:  o Mediterranean grilled salmon, seasoned with garlic, basil, parsley, lemon juice and black pepper o Shrimp, lemon, and spinach whole-grain pasta salad made with low fat greek yogurt  o Seared scallops with lemon orzo  o Seared tuna steaks seasoned salt, pepper, coriander topped with tomato mixture of olives, tomatoes, olive oil, minced garlic, parsley, green onions and cappers  . Meats:  o Herbed greek chicken salad with kalamata olives, cucumber, feta  o Red bell peppers stuffed  with spinach, bulgur, lean ground beef (or lentils) & topped with feta   o Kebabs: skewers of chicken, tomatoes, onions, zucchini, squash  o [place] burgers: made with red onions, mint, dill, lemon juice, feta cheese topped with roasted red peppers . Vegetarian o Cucumber salad: cucumbers, artichoke hearts, celery, red onion, feta cheese, tossed in olive oil & lemon juice  o Hummus and whole grain pita points with a greek salad (lettuce, tomato, feta, olives, cucumbers, red onion) o Lentil soup with celery, carrots made with vegetable broth, garlic, salt and pepper  o Tabouli salad: parsley, bulgur, mint, scallions, cucumbers, tomato, radishes, lemon juice, olive oil, salt and pepper. o

## 2014-08-13 NOTE — Progress Notes (Signed)
Subjective:    Patient ID: Autumn Cook, female    DOB: [birth year], 54 y.o.   MRN: [id number]  HPI  This 54 y.o. Female is here for follow-up of 102 [facility] visit for atypical CP on 07/19/2014. Pt was sent to [facility] ED for eval for ACS with elavted BP. Evaluation there included labs (troponin= 0.0 and D-dimer= <0.27) and normal ECG. CXR negative for acute disease. Pt has reproducible musculoskeletal pain around L scapula. Pain persists but has lessened. Pt is compliant w/ BP meds; denies diaphoresis, fatigue, palpitations, SOb or DOE, edema, HA, dizziness, numbness, weakness or syncope. Weight gain since Nov 2016. 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  Patient Active Problem List   Diagnosis Date Noted  . Hypertension 09/11/2012  . Overweight (BMI 25.0-29.9) 09/11/2012  . Migraine, unspecified, without mention of intractable migraine without mention of status migrainosus 09/11/2012  . Hx of thyroid nodule 09/11/2012  . History of tobacco use 09/11/2012    Prior to Admission medications   Medication Sig Start Date End Date Taking? Authorizing Provider  amLODipine (NORVASC) 5 MG tablet Take 1 tablet (5 mg total) by mouth daily. Patient taking differently: Take 5 mg by mouth at bedtime.  03/27/14  Yes Barton Fanny, MD  atenolol (TENORMIN) 50 MG tablet Take 1 tablet (50 mg total) by mouth daily. 03/27/14  Yes Barton Fanny, MD  hydrochlorothiazide (HYDRODIURIL) 25 MG tablet Take 1 tablet (25 mg total) by mouth daily. 03/27/14  Yes Barton Fanny, MD  traMADol (ULTRAM) 50 MG tablet Take 1 tablet (50 mg total) by mouth every 6 (six) hours as needed. Patient not taking: Reported on 08/13/2014 08/06/14   Noland Fordyce, PA-C    No past surgical history on file.   History   Social History  . Marital Status: Married    Spouse Name: N/A  . Number of Children: N/A  . Years of Education: N/A   Occupational History  . real estate agent    Social History  Main Topics  . Smoking status: Former Research scientist (life sciences)  . Smokeless tobacco: Not on file  . Alcohol Use: No  . Drug Use: No  . Sexual Activity: No   Other Topics Concern  . Not on file   Social History Narrative   married   Family History  Problem Relation Age of Onset  . Hypertension Father   . Cancer Father 79    lung cancer  . Stroke Father   . Heart disease Mother     arrhythmia  . Hypertension Mother   . Kidney failure Sister     Review of Systems Asper HPI.     Objective:   Physical Exam  Constitutional: She is oriented to person, place, and time. Vital signs are normal. She appears well-developed and well-nourished. No distress.  Blood pressure 140/90, pulse 63, temperature 98.1 F (36.7 C), temperature source Oral, resp. rate 16, height 5\' 7"  (1.702 m), weight 196 lb 3.2 oz (88.996 kg), last menstrual period 10/06/2013, SpO2 98 %.   HENT:  Head: Normocephalic and atraumatic.  Right Ear: External ear normal.  Left Ear: External ear normal.  Nose: Nose normal.  Mouth/Throat: Oropharynx is clear and moist.  Eyes: Conjunctivae and EOM are normal. No scleral icterus.  Neck: Normal range of motion. Neck supple. No thyromegaly present.  Cardiovascular: Normal rate, regular rhythm and normal heart sounds.  Exam reveals no gallop.   No murmur heard. Pulmonary/Chest: Effort normal  and breath sounds normal. No respiratory distress.  Musculoskeletal: Normal range of motion. She exhibits no edema.       Cervical back: Normal.       Thoracic back: She exhibits tenderness, deformity and spasm. She exhibits no bony tenderness, no edema and no pain.       Lumbar back: Normal.  Lymphadenopathy:    She has no cervical adenopathy.  Neurological: She is alert and oriented to person, place, and time. No cranial nerve deficit. She exhibits normal muscle tone. Coordination normal.  Skin: Skin is warm and dry. She is not diaphoretic.  Psychiatric: She has a normal mood and affect. Her  behavior is normal. Judgment and thought content normal.  Nursing note and vitals reviewed.   [facility] reading (PRIMARY) by  Dr. Leward Quan: Thoracic spine- Scoliosis with mild-moderately severe degenerative disc disease. No abnormal bony lesions or fractures.     Assessment & Plan:  Left-sided thoracic back pain - Advised weight loss and good back support w/ ROM exercises and dore strengthening. Consider chiropractor treatment.. Plan: DG Thoracic Spine 2 View  Essential hypertension- Continue current medications.  Atypical chest pain- Cardiology referral for further evaluation. Pt has a preference for specialty practice and will get that info back to me.

## 2014-08-18 DIAGNOSIS — M4120 Other idiopathic scoliosis, site unspecified: Secondary | ICD-10-CM | POA: Insufficient documentation

## 2014-10-12 ENCOUNTER — Encounter: Payer: Self-pay | Admitting: Cardiology

## 2014-10-12 ENCOUNTER — Ambulatory Visit (INDEPENDENT_AMBULATORY_CARE_PROVIDER_SITE_OTHER): Payer: Managed Care, Other (non HMO) | Admitting: Cardiology

## 2014-10-12 VITALS — BP 162/84 | HR 76 | Ht 66.0 in | Wt 194.8 lb

## 2014-10-12 DIAGNOSIS — I10 Essential (primary) hypertension: Secondary | ICD-10-CM | POA: Diagnosis not present

## 2014-10-12 DIAGNOSIS — E7800 Pure hypercholesterolemia, unspecified: Secondary | ICD-10-CM

## 2014-10-12 DIAGNOSIS — R079 Chest pain, unspecified: Secondary | ICD-10-CM | POA: Diagnosis not present

## 2014-10-12 DIAGNOSIS — R072 Precordial pain: Secondary | ICD-10-CM | POA: Diagnosis not present

## 2014-10-12 MED ORDER — LOSARTAN POTASSIUM 50 MG PO TABS: 50.0000 mg | ORAL_TABLET | Freq: Every day | ORAL | Status: DC

## 2014-10-12 NOTE — Patient Instructions (Signed)
Start losartan 50 mg daily  Continue your other medication  We will schedule you for a nuclear stress test and recheck your kidney function.

## 2014-10-13 DIAGNOSIS — E7800 Pure hypercholesterolemia, unspecified: Secondary | ICD-10-CM | POA: Insufficient documentation

## 2014-10-13 DIAGNOSIS — R079 Chest pain, unspecified: Secondary | ICD-10-CM | POA: Insufficient documentation

## 2014-10-13 NOTE — Progress Notes (Signed)
Cardiology Office Note   Date:  10/13/2014   ID:  Autumn Cook, DOB 1960/09/19, MRN [id number]  PCP:  Jenny Reichmann, MD  Cardiologist:   Jason Frisbee [place], MD   Chief Complaint  Patient presents with  . New Evaluation    has has chest pain, has had shortness of breath, has edema in left leg, has pain in left leg,occassional cramping in legs, has lightheadedness, occassional dizziness      History of Present Illness: Autumn Cook is a 54 y.o. female who presents for evaluation of chest pain at the request of Dr. Leward Quan. She reports that she experienced an episode of chest pain on March 30th. She developed a tightness and pain in the left chest and left arm numbness. She then experienced excruciating pain through her entire left side. She went to Urgent care and was sent to the ED. She was hypertensive. Ecg showed anterior T wave inversion that was unchanged from July 2015. Otherwise evaluation was negative. She states the pain lasted about 8 hours and she did get some relief with sl Ntg. Since then she has had periods where she gets SOB. She has some dizziness. She has periodic pain and swelling in the left ankle joint. She feels more fatigued and dozes off easily. BP has continued to be poorly controlled. She is on Atenolol and HCTZ. She states that amlodipine drops her BP too low and she can't even sit up. No known history of cardiac disease.     Past Medical History  Diagnosis Date  . Allergy   . Arthritis   . Migraine   . Hypertension     History reviewed. No pertinent past surgical history.   Current Outpatient Prescriptions  Medication Sig Dispense Refill  . atenolol (TENORMIN) 50 MG tablet Take 1 tablet (50 mg total) by mouth daily. 30 tablet 11  . hydrochlorothiazide (HYDRODIURIL) 25 MG tablet Take 1 tablet (25 mg total) by mouth daily. 30 tablet 11  . losartan (COZAAR) 50 MG tablet Take 1 tablet (50 mg total) by mouth daily. 90 tablet 3   No current  facility-administered medications for this visit.    Allergies:   Asa and Penicillins    Social History:  The patient  reports that she has quit smoking. She does not have any smokeless tobacco history on file. She reports that she does not drink alcohol or use illicit drugs.   Family History:  The patient's family history includes Cancer (age of onset: 37) in her father; Heart disease in her mother; Hypertension in her father and mother; Kidney failure in her sister; Stroke in her father.    ROS:  Please see the history of present illness.   Otherwise, review of systems are positive for none.   All other systems are reviewed and negative.    PHYSICAL EXAM: VS:  BP 162/84 mmHg  Pulse 76  Ht 5\' 6"  (1.676 m)  Wt 88.361 kg (194 lb 12.8 oz)  BMI 31.46 kg/m2  LMP 10/06/2013 , BMI Body mass index is 31.46 kg/(m^2). GEN: Well nourished, overweight, in no acute distress HEENT: normal Neck: no JVD, carotid bruits, or masses Cardiac: RRR; no murmurs, rubs, or gallops,no edema  Respiratory:  clear to auscultation bilaterally, normal work of breathing GI: soft, nontender, nondistended, + BS MS: no deformity or atrophy Skin: warm and dry, no rash Neuro:  Strength and sensation are intact Psych: euthymic mood, full affect   EKG:  EKG is not ordered  today. The ekg ordered today demonstrates Ecg reviewed from March 31. NSR with anterior T wave abnormality.   Recent Labs: 11/24/2013: ALT 14; TSH 0.888 08/06/2014: BUN 10; Creatinine 0.86; Hemoglobin 12.0; Platelets 334; Potassium 3.6; Sodium 142    Lipid Panel    Component Value Date/Time   CHOL 216* 03/27/2014 1130   TRIG 178* 03/27/2014 1130   HDL 46 03/27/2014 1130   CHOLHDL 4.7 03/27/2014 1130   VLDL 36 03/27/2014 1130   LDLCALC 134* 03/27/2014 1130      Wt Readings from Last 3 Encounters:  10/12/14 88.361 kg (194 lb 12.8 oz)  08/13/14 88.996 kg (196 lb 3.2 oz)  08/06/14 83.915 kg (185 lb)      Other studies  Reviewed: Additional studies/ records that were reviewed today include: none. Review of the above records demonstrates: N/A   ASSESSMENT AND PLAN:  1.  Chest pain with atypical features. Cardiac risk factors of HTN and hypercholesterolemia. She is at intermediate risk. Will evaluate further with a stress Myoview study.  2. HTN poorly controlled. She reports intolerance to Norvasc. States other beta blockers were tried in the past but could not control her migraine HAs like atenolol. Will add losartan 50 mg daily. Check BMET next week.   3. Hypercholesterolemia. Mild  4. Migraine HAs.    Current medicines are reviewed at length with the patient today.  The patient does not have concerns regarding medicines.  The following changes have been made:  See above  Labs/ tests ordered today include:  Orders Placed This Encounter  Procedures  . Basic metabolic panel  . Myocardial Perfusion Imaging     Disposition:   FU TBD  Signed, Enez Monahan Martinique, MD,FACC  10/13/2014 4:44 PM    [facility] [address] [place], [place], [address] Phone [phone], Fax [phone]

## 2014-10-14 ENCOUNTER — Telehealth: Payer: Self-pay | Admitting: Cardiology

## 2014-10-14 NOTE — Telephone Encounter (Signed)
Returned call to patient advised she has only been taking losartan for 2 days.Advised has not been long enough.Advised to continue to monitor B/P, keep myoview appointment 10/20/14.Advised to call back if B/P continues to be elevated.

## 2014-10-14 NOTE — Telephone Encounter (Signed)
Pt saw Dr Martinique on Monday and he started her on Losartan. She says her blood pressure have not decreased any.Please call to advise.

## 2014-10-15 ENCOUNTER — Telehealth (HOSPITAL_COMMUNITY): Payer: Self-pay

## 2014-10-15 NOTE — Telephone Encounter (Signed)
Encounter complete. 

## 2014-10-16 ENCOUNTER — Encounter: Payer: Self-pay | Admitting: *Deleted

## 2014-10-20 ENCOUNTER — Telehealth: Payer: Self-pay | Admitting: Cardiology

## 2014-10-20 ENCOUNTER — Ambulatory Visit (HOSPITAL_COMMUNITY)
Admission: RE | Admit: 2014-10-20 | Discharge: 2014-10-20 | Disposition: A | Discharge status: Home / Self Care | Payer: Managed Care, Other (non HMO) | Source: Ambulatory Visit | Attending: Internal Medicine | Admitting: Internal Medicine

## 2014-10-20 DIAGNOSIS — R072 Precordial pain: Secondary | ICD-10-CM | POA: Diagnosis not present

## 2014-10-20 LAB — MYOCARDIAL PERFUSION IMAGING
CHL CUP MPHR: 168 {beats}/min
CHL CUP NUCLEAR SDS: 2
CHL CUP NUCLEAR SSS: 3
Estimated workload: 7.3 METS
Exercise duration (min): 7 min
Exercise duration (sec): 31 s
LV dias vol: 73 mL
LVSYSVOL: 23 mL
NUC STRESS EF: 68 %
NUC STRESS TID: 1.19
Peak HR: 148 {beats}/min
Percent HR: 88 %
RPE: 28482
Rest HR: 47 {beats}/min
SRS: 1

## 2014-10-20 MED ORDER — TECHNETIUM TC 99M SESTAMIBI GENERIC - CARDIOLITE
31.6000 | Freq: Once | INTRAVENOUS | Status: AC | PRN
  Administered 2014-10-20: 32 via INTRAVENOUS

## 2014-10-20 MED ORDER — TECHNETIUM TC 99M SESTAMIBI GENERIC - CARDIOLITE
10.5000 | Freq: Once | INTRAVENOUS | Status: AC | PRN
  Administered 2014-10-20: 11 via INTRAVENOUS

## 2014-10-20 NOTE — Telephone Encounter (Signed)
Go ahead and increase losartan to 100 mg daily. Will need to wait at least 2 weeks to judge response.  Peter Martinique MD, [facility]

## 2014-10-20 NOTE — Telephone Encounter (Signed)
Calling to let  Dr. Martinique know that her bp is still running high . Please call

## 2014-10-20 NOTE — Telephone Encounter (Signed)
Pt reports high systolic/diastolic.  Has been checking BPs at home, usually 3 times a day. Advised on instruction for home check, times to check BP.  HR typically in 70s, BP running 150-160/90s.  Lowest BP she obtained this last week was 140/88.  Ongoing problem w/ no acute symptoms, informed pt would defer to Dr. Martinique for med adjustment recommendations.

## 2014-10-22 NOTE — Telephone Encounter (Signed)
Patient called Dr.Jordan advised to increase losartan to 100 mg daily.Advised to wait 2 weeks for a response and call back if B/P continues to be elevated.

## 2014-10-22 NOTE — Addendum Note (Signed)
Addended by: Golden Hurter D on: 10/22/2014 10:40 AM   Modules accepted: Orders, Medications

## 2014-10-28 LAB — BASIC METABOLIC PANEL
BUN: 14 mg/dL (ref 6–23)
CALCIUM: 10.5 mg/dL (ref 8.4–10.5)
CO2: 31 meq/L (ref 19–32)
CREATININE: 0.65 mg/dL (ref 0.50–1.10)
Chloride: 99 mEq/L (ref 96–112)
Glucose, Bld: 150 mg/dL — ABNORMAL HIGH (ref 70–99)
Potassium: 3.5 mEq/L (ref 3.5–5.3)
SODIUM: 140 meq/L (ref 135–145)

## 2014-11-12 ENCOUNTER — Telehealth: Payer: Self-pay

## 2014-11-12 NOTE — Telephone Encounter (Signed)
Patient called no answer.Left message on personal voice mail spoke to [place] he advised  follow up with him if needed.

## 2015-02-09 ENCOUNTER — Encounter: Payer: Self-pay | Admitting: Emergency Medicine

## 2015-04-14 ENCOUNTER — Ambulatory Visit (INDEPENDENT_AMBULATORY_CARE_PROVIDER_SITE_OTHER): Payer: Managed Care, Other (non HMO) | Admitting: Physician Assistant

## 2015-04-14 ENCOUNTER — Encounter: Payer: Self-pay | Admitting: Physician Assistant

## 2015-04-14 VITALS — BP 140/90 | HR 66 | Temp 98.5°F | Resp 16 | Ht 67.25 in | Wt 200.6 lb

## 2015-04-14 DIAGNOSIS — I10 Essential (primary) hypertension: Secondary | ICD-10-CM | POA: Diagnosis not present

## 2015-04-14 DIAGNOSIS — M7662 Achilles tendinitis, left leg: Secondary | ICD-10-CM

## 2015-04-14 DIAGNOSIS — M7661 Achilles tendinitis, right leg: Secondary | ICD-10-CM

## 2015-04-14 DIAGNOSIS — R7309 Other abnormal glucose: Secondary | ICD-10-CM | POA: Diagnosis not present

## 2015-04-14 LAB — COMPREHENSIVE METABOLIC PANEL
ALT: 14 U/L (ref 6–29)
AST: 17 U/L (ref 10–35)
Albumin: 4.7 g/dL (ref 3.6–5.1)
Alkaline Phosphatase: 67 U/L (ref 33–130)
BUN: 11 mg/dL (ref 7–25)
CALCIUM: 10.2 mg/dL (ref 8.6–10.4)
CO2: 28 mmol/L (ref 20–31)
CREATININE: 0.7 mg/dL (ref 0.50–1.05)
Chloride: 98 mmol/L (ref 98–110)
GLUCOSE: 89 mg/dL (ref 65–99)
Potassium: 3.7 mmol/L (ref 3.5–5.3)
Sodium: 137 mmol/L (ref 135–146)
Total Bilirubin: 0.3 mg/dL (ref 0.2–1.2)
Total Protein: 8 g/dL (ref 6.1–8.1)

## 2015-04-14 MED ORDER — AMLODIPINE BESYLATE 10 MG PO TABS: 10.0000 mg | ORAL_TABLET | Freq: Every day | ORAL | Status: DC

## 2015-04-14 MED ORDER — ATENOLOL 50 MG PO TABS: 50.0000 mg | ORAL_TABLET | Freq: Every day | ORAL | Status: DC

## 2015-04-14 MED ORDER — HYDROCHLOROTHIAZIDE 25 MG PO TABS: 25.0000 mg | ORAL_TABLET | Freq: Every day | ORAL | Status: DC

## 2015-04-14 NOTE — Patient Instructions (Addendum)
Supportive shoes, no heals Take tylenol for pain. Heat and stetching You will get a phone call to make appt with ortho I will call you with your labs Pick up new amlodipine and take daily with rest of meds. Come back in the new year for a physical.

## 2015-04-14 NOTE — Progress Notes (Signed)
[facility] [address] [place] [address] [phone]  Date:  04/14/2015   Name:  Autumn Cook   DOB:  06-05-1960   MRN:  [id number]  PCP:  Jenny Reichmann, MD    Chief Complaint: Medication Refill and ankle pain   History of Present Illness:  This is a 54 y.o. female with PMH HTN who is presenting for follow up HTN. She has had uncontrolled HTN with episodes of cp/sob in the past. She was seen in ED March 2016 for cp with normal work up (nml CXR, dimer, troponins, EKG). Had outpt card work up in June 2016 with normal myoview. Pt is taking amlodipine 5 mg, atenolol 50 mg and hctz 25 mg. She was rx'd losartan in the past but unable to tolerate. She denies recent cp, sob, palps, lightheadedness, blurred vision.   Pt complains of bilateral achilles pain x 8 months. More pain with dorsiflexion. States feels better to wear heels, never wears flats because of pain. She will stretch in the shower. She does not take any meds. States her bilateral calves are sore and feel a little numb. No swelling or redness.  Last CPE 1 year ago.  Review of Systems:  Review of Systems See HPI  Patient Active Problem List   Diagnosis Date Noted  . Chest pain with moderate risk for cardiac etiology 10/13/2014  . Hypercholesterolemia 10/13/2014  . Idiopathic scoliosis 08/18/2014  . Hypertension 09/11/2012  . Overweight (BMI 25.0-29.9) 09/11/2012  . Migraine, unspecified, without mention of intractable migraine without mention of status migrainosus 09/11/2012  . Hx of thyroid nodule 09/11/2012  . History of tobacco use 09/11/2012    Prior to Admission medications   Medication Sig Start Date End Date Taking? Authorizing Provider  amLODipine (NORVASC) 5 MG tablet Take 5 mg by mouth daily.   Yes Historical Provider, MD  atenolol (TENORMIN) 50 MG tablet Take 1 tablet (50 mg total) by mouth daily. 03/27/14  Yes Barton Fanny, MD  hydrochlorothiazide (HYDRODIURIL) 25 MG  tablet Take 1 tablet (25 mg total) by mouth daily. 03/27/14  Yes Barton Fanny, MD           Allergies  Allergen Reactions  . Asa [Aspirin] Other (See Comments)    GI upset   . Penicillins Hives    History reviewed. No pertinent past surgical history.  Social History  Substance Use Topics  . Smoking status: Former Research scientist (life sciences)  . Smokeless tobacco: None  . Alcohol Use: No    Family History  Problem Relation Age of Onset  . Hypertension Father   . Cancer Father 51    lung cancer  . Stroke Father   . Heart disease Mother     arrhythmia  . Hypertension Mother   . Kidney failure Sister     Medication list has been reviewed and updated.  Physical Examination:  Physical Exam  Constitutional: She is oriented to person, place, and time. She appears well-developed and well-nourished. No distress.  HENT:  Head: Normocephalic and atraumatic.  Right Ear: Hearing normal.  Left Ear: Hearing normal.  Nose: Nose normal.  Eyes: Conjunctivae and lids are normal. Right eye exhibits no discharge. Left eye exhibits no discharge. No scleral icterus.  Cardiovascular: Normal rate, regular rhythm, normal heart sounds and normal pulses.   No murmur heard. Pulmonary/Chest: Effort normal and breath sounds normal. No respiratory distress. She has no wheezes. She has no rhonchi. She has no rales.  Musculoskeletal:  Normal range of motion.       Right ankle: Achilles tendon exhibits pain. Achilles tendon exhibits no defect.       Left ankle: She exhibits normal range of motion, no swelling, no deformity and normal pulse. No lateral malleolus and no medial malleolus tenderness found. Achilles tendon exhibits pain. Achilles tendon exhibits no defect.       Right lower leg: She exhibits tenderness (mild).       Left lower leg: She exhibits tenderness (mild).  Pain over bilateral achilles tendons with forced dorsiflexion Mild pain over bilateral calves with palpation No calf/foot swelling No skin  changes Pedal pulses intact  Neurological: She is alert and oriented to person, place, and time. She has normal strength. No sensory deficit.  Skin: Skin is warm, dry and intact. No lesion and no rash noted.  Psychiatric: She has a normal mood and affect. Her speech is normal and behavior is normal. Thought content normal.    BP 154/100 mmHg  Pulse 66  Temp(Src) 98.5 F (36.9 C) (Oral)  Resp 16  Ht 5' 7.25" (1.708 m)  Wt 200 lb 9.6 oz (90.992 kg)  BMI 31.19 kg/m2  SpO2 97%  LMP 10/06/2013  BP recheck 140/90  Assessment and Plan:  1. Essential hypertension Uncontrolled on current regimen. Will double dose of amlodipine. She will follow up at CPE in 1-2 months. - amLODipine (NORVASC) 10 MG tablet; Take 1 tablet (10 mg total) by mouth daily.  Dispense: 90 tablet; Refill: 3 - hydrochlorothiazide (HYDRODIURIL) 25 MG tablet; Take 1 tablet (25 mg total) by mouth daily.  Dispense: 90 tablet; Refill: 3 - atenolol (TENORMIN) 50 MG tablet; Take 1 tablet (50 mg total) by mouth daily.  Dispense: 90 tablet; Refill: 3 - Comprehensive metabolic panel - CBC  2. Achilles tendinitis of both lower extremities Advised not wearing heels anymore as this is likely making worse. Wear supportive shoes. Counseled on tylenol, heat, stretching. Referred to ortho. - Ambulatory referral to Orthopedic Surgery  3. Elevated glucose Elevated glucose in the past, will check a1c today. - Hemoglobin A1c   Benjaman Pott. Drenda Freeze, MHS [facility]  04/19/2015

## 2015-04-15 LAB — HEMOGLOBIN A1C
Hgb A1c MFr Bld: 6.5 % — ABNORMAL HIGH (ref <5.7)
Mean Plasma Glucose: 140 mg/dL — ABNORMAL HIGH (ref <117)

## 2015-04-15 LAB — CBC
HCT: 39.5 % (ref 36.0–46.0)
Hemoglobin: 13.4 g/dL (ref 12.0–15.0)
MCH: 29.1 pg (ref 26.0–34.0)
MCHC: 33.9 g/dL (ref 30.0–36.0)
MCV: 85.7 fL (ref 78.0–100.0)
MPV: 9.6 fL (ref 8.6–12.4)
Platelets: 414 10*3/uL — ABNORMAL HIGH (ref 150–400)
RBC: 4.61 MIL/uL (ref 3.87–5.11)
RDW: 14.8 % (ref 11.5–15.5)
WBC: 8.3 10*3/uL (ref 4.0–10.5)

## 2015-04-27 ENCOUNTER — Other Ambulatory Visit: Payer: Self-pay | Admitting: *Deleted

## 2015-04-27 ENCOUNTER — Other Ambulatory Visit: Payer: Self-pay

## 2015-04-27 DIAGNOSIS — I10 Essential (primary) hypertension: Secondary | ICD-10-CM

## 2015-04-27 MED ORDER — HYDROCHLOROTHIAZIDE 25 MG PO TABS: 25.0000 mg | ORAL_TABLET | Freq: Every day | ORAL | Status: DC

## 2015-04-27 MED ORDER — ATENOLOL 50 MG PO TABS: 50.0000 mg | ORAL_TABLET | Freq: Every day | ORAL | Status: DC

## 2015-04-27 MED ORDER — AMLODIPINE BESYLATE 10 MG PO TABS: 10.0000 mg | ORAL_TABLET | Freq: Every day | ORAL | Status: DC

## 2015-06-11 ENCOUNTER — Encounter: Payer: Self-pay | Admitting: Physician Assistant

## 2015-06-11 DIAGNOSIS — M7662 Achilles tendinitis, left leg: Secondary | ICD-10-CM

## 2015-06-11 DIAGNOSIS — M7661 Achilles tendinitis, right leg: Secondary | ICD-10-CM | POA: Insufficient documentation

## 2015-08-18 ENCOUNTER — Telehealth: Payer: Self-pay

## 2015-08-18 NOTE — Telephone Encounter (Signed)
Pt is livid about the appointments. She has been seen multiple times in the appointment center and she cannot get a appointment with any of the providers she has seen. She DOES NOT want to come in the walk-in that is the whole purpose of her making appointments for 15 years.   Please speak with this patient about this matter even though it has been explained to her and the husband.  [phone]

## 2015-08-23 NOTE — Telephone Encounter (Signed)
Will call patient today and offer her an appointment with Tor Netters, since she has opening in May

## 2015-09-07 ENCOUNTER — Ambulatory Visit: Payer: Managed Care, Other (non HMO) | Admitting: Family Medicine

## 2015-09-14 ENCOUNTER — Ambulatory Visit (INDEPENDENT_AMBULATORY_CARE_PROVIDER_SITE_OTHER): Payer: Managed Care, Other (non HMO) | Admitting: Family Medicine

## 2015-09-14 ENCOUNTER — Encounter: Payer: Self-pay | Admitting: Family Medicine

## 2015-09-14 VITALS — BP 128/80 | HR 71 | Temp 99.0°F | Resp 16 | Ht 67.0 in | Wt 194.0 lb

## 2015-09-14 DIAGNOSIS — R053 Chronic cough: Secondary | ICD-10-CM

## 2015-09-14 DIAGNOSIS — R0602 Shortness of breath: Secondary | ICD-10-CM | POA: Diagnosis not present

## 2015-09-14 DIAGNOSIS — R05 Cough: Secondary | ICD-10-CM | POA: Diagnosis not present

## 2015-09-14 DIAGNOSIS — E119 Type 2 diabetes mellitus without complications: Secondary | ICD-10-CM

## 2015-09-14 DIAGNOSIS — I10 Essential (primary) hypertension: Secondary | ICD-10-CM | POA: Diagnosis not present

## 2015-09-14 DIAGNOSIS — E669 Obesity, unspecified: Secondary | ICD-10-CM

## 2015-09-14 DIAGNOSIS — E1169 Type 2 diabetes mellitus with other specified complication: Secondary | ICD-10-CM

## 2015-09-14 DIAGNOSIS — R7309 Other abnormal glucose: Secondary | ICD-10-CM

## 2015-09-14 LAB — POCT GLYCOSYLATED HEMOGLOBIN (HGB A1C): HEMOGLOBIN A1C: 7.4

## 2015-09-14 MED ORDER — BENZONATATE 100 MG PO CAPS: 100.0000 mg | ORAL_CAPSULE | Freq: Three times a day (TID) | ORAL | Status: DC | PRN

## 2015-09-14 MED ORDER — ALBUTEROL SULFATE HFA 108 (90 BASE) MCG/ACT IN AERS: 2.0000 | INHALATION_SPRAY | RESPIRATORY_TRACT | Status: DC | PRN

## 2015-09-14 MED ORDER — ALBUTEROL SULFATE (2.5 MG/3ML) 0.083% IN NEBU
2.5000 mg | INHALATION_SOLUTION | Freq: Once | RESPIRATORY_TRACT | Status: AC
  Administered 2015-09-14: 2.5 mg via RESPIRATORY_TRACT

## 2015-09-14 NOTE — Patient Instructions (Addendum)
Use your albuterol inhaler every 4-6 hours while awake for the next 2 days If cough and breathing not significantly better by end of week, please send me a mychart message or call me  Aim for 1 pound weight loss per week Try to exercise at least every other day Avoid soda, sweet tea, juice. Increase vegetables, lean proteins and water intake Please call in July to schedule a follow up appointment for August   Insulin Resistance Insulin is a hormone made by the pancreas that controls blood sugar (glucose) levels. It is needed for your body to function normally. Insulin resistance occurs when your body does not respond well to the insulin made by your pancreas. Insulin resistance results in high blood glucose levels and can lead to problems, including:  Prediabetes.  Type 2 diabetes.  Heart disease.  High blood pressure (hypertension).  Stroke.  Polycystic ovary syndrome.  Fatty liver. CAUSES  The exact causes is unknown. RISK FACTORS  Being overweight or obese, especially if your weight is centered in the waist area.  Physical inactivity. SIGNS AND SYMPTOMS  There are usually no symptoms. DIAGNOSIS  There is no test to diagnose insulin resistance. However, your health care provider may suspect that you have insulin resistance if you have:  High blood glucose levels.  Abnormal cholesterol levels.  A waist circumference of more than 35 inches for women and more than 40 inches for men.  The risk factors for insulin resistance. To make a diagnosis, your health care provider will perform a physical exam. During the exam, he or she will ask you questions about your health and medical history. TREATMENT  The most important treatment is lifestyle changes. Your health care provider can help you make the changes that are appropriate. These may include:  Eating less.  Being physically active.  Stopping the use of any tobacco products.  Taking medicine to help improve your  insulin sensitivity. HOME CARE INSTRUCTIONS   Eat a healthy diet.  Choose healthy portion sizes.  Eat more vegetables.  Drink more water.  Cut back on high-fat toppings.  Eat foods that are low in fat, such as fruits, vegetables, and lean meats.  Be physically active to reach and maintain a healthy weight.  Exercise 4 days a week for 20-40 minutes at a time, or as directed by your health care provider.  Take the stairs instead of the elevator.  Walk around when you talk on the phone.  Walk your dog if you have one.  Take medicines only as directed by your health care provider.  Do not use any tobacco products, including cigarettes, chewing tobacco, or electronic cigarettes. If you need help quitting, ask your health care provider.  Keep all follow-up visits as directed by your health care provider. This is important. SEEK MEDICAL CARE IF:   You have trouble losing weight or maintaining your goal weight.  You gain weight.  You have trouble following your prescribed meal plan.  You cannot exercise or do your normal exercises.   This information is not intended to replace advice given to you by your health care provider. Make sure you discuss any questions you have with your health care provider.   Document Released: 06/13/2005 Document Revised: 05/15/2014 Document Reviewed: 10/03/2012 Elsevier Interactive Patient Education 2016 [employer].    IF you received an x-ray today, you will receive an invoice from [facility]. Please contact [facility] at [phone] with questions or concerns regarding your invoice.   IF you  received labwork today, you will receive an invoice from [employer]. Please contact [facility] at [phone] with questions or concerns regarding your invoice.   Our billing staff will not be able to assist you with questions regarding bills from these companies.  You will be contacted with the lab  results as soon as they are available. The fastest way to get your results is to activate your My Chart account. Instructions are located on the last page of this paperwork. If you have not heard from [place] regarding the results in 2 weeks, please contact this office.

## 2015-09-14 NOTE — Progress Notes (Signed)
Subjective:    Patient ID: Autumn Cook, female    DOB: October 13, 1960, 55 y.o.   MRN: [id number]  HPI This is a 55 yo female who presents today with 2 months of persistent cough that followed a cold.  Feels SOB with excessive talking. Cough can be cyclical, not worse at night.  Has a history of childhood asthma. Last attack at age 48. Had severe symptoms requiring hospitalization.  Blood sugar elevated in past.   Past Medical History  Diagnosis Date  . Allergy   . Arthritis   . Migraine   . Hypertension    No past surgical history on file. Family History  Problem Relation Age of Onset  . Hypertension Father   . Cancer Father 53    lung cancer  . Stroke Father   . Heart disease Mother     arrhythmia  . Hypertension Mother   . Kidney failure Sister    Social History  Substance Use Topics  . Smoking status: Former Research scientist (life sciences)  . Smokeless tobacco: None  . Alcohol Use: No      Review of Systems + cough- foamy/mucus, uncolored, no wheezing (some in past), no headache, no fever, no runny nose, no post nasal drainage, no sore throat, no headache.     Objective:   Physical Exam  Constitutional: She is oriented to person, place, and time. She appears well-developed and well-nourished. No distress.  obese  Eyes: Conjunctivae are normal.  Neck: Normal range of motion. Neck supple.  Cardiovascular: Normal rate, regular rhythm and normal heart sounds.   Pulmonary/Chest: Effort normal. She has wheezes (few scattered upper right posterior).  Lymphadenopathy:    She has no cervical adenopathy.  Neurological: She is alert and oriented to person, place, and time.  Skin: Skin is warm and dry. She is not diaphoretic.  Psychiatric: She has a normal mood and affect. Her behavior is normal. Judgment and thought content normal.  Vitals reviewed.  Subjective and objective improvement following albuterol nebulizer.    BP 128/80 mmHg  Pulse 71  Temp(Src) 99 F (37.2 C)  Resp 16  Ht  5\' 7"  (1.702 m)  Wt 194 lb (87.998 kg)  BMI 30.38 kg/m2  LMP 10/06/2013 Wt Readings from Last 3 Encounters:  09/14/15 194 lb (87.998 kg)  04/14/15 200 lb 9.6 oz (90.992 kg)  10/20/14 194 lb (87.998 kg)   Results for orders placed or performed in visit on 09/14/15  POCT glycosylated hemoglobin (Hb A1C)  Result Value Ref Range   Hemoglobin A1C 7.4    Office Spirometry Results:Pretreatment FEV1: 1.92 liters FVC: 2.8 liters FEV1/FVC: 68.6 % FVC  % Predicted: 88 liters FEV % Predicted: 76 liters FeF 25-75: 1.29 liters FeF 25-75 % Predicted: 51 Office Spirometry Results: Post treatment FEV1: 2.17 liters FVC: 2.69 liters FEV1/FVC: 80.7 % FVC  % Predicted: 85 liters FEV % Predicted: 86 liters FeF 25-75: 2.17 liters FeF 25-75 % Predicted: 85      Assessment & Plan:  1. Chronic cough - suspect flare of asthma/reactive airway following viral URI - she had normalization of PFTs following albuterol neb, will treat first with albuterol inhaler q4-6 hours for next 48 hours then prn. If no significant improvement, she will let me know and will do prednisone taper - encouraged cough suppression - PFT PULM FXN SPIROMETRY (94010) - albuterol (PROVENTIL) (2.5 MG/3ML) 0.083% nebulizer solution 2.5 mg; Take 3 mLs (2.5 mg total) by nebulization once. - albuterol (PROVENTIL HFA;VENTOLIN HFA) 108 (90 Base)  MCG/ACT inhaler; Inhale 2 puffs into the lungs every 4 (four) hours as needed for wheezing or shortness of breath (cough, shortness of breath or wheezing.).  Dispense: 1 Inhaler; Refill: 1 - benzonatate (TESSALON) 100 MG capsule; Take 1-2 capsules (100-200 mg total) by mouth 3 (three) times daily as needed for cough.  Dispense: 40 capsule; Refill: 0  2. Essential hypertension - well controlled on current regimen  3. SOB (shortness of breath) - subjective improvement with albuterol - PFT PULM FXN SPIROMETRY (94010) - albuterol (PROVENTIL) (2.5 MG/3ML) 0.083% nebulizer solution 2.5 mg; Take 3  mLs (2.5 mg total) by nebulization once. - albuterol (PROVENTIL HFA;VENTOLIN HFA) 108 (90 Base) MCG/ACT inhaler; Inhale 2 puffs into the lungs every 4 (four) hours as needed for wheezing or shortness of breath (cough, shortness of breath or wheezing.).  Dispense: 1 Inhaler; Refill: 1  4. Diabetes mellitus type 2 in obese [facility]) - discussed diagnosis and provided information regarding diet and medication. Offered to start on metformin, patient would like 3 month trial of diet and increased exercise to see if she can reduce on her own - POCT glycosylated hemoglobin (Hb A1C)  - follow up in 3 months, sooner if problems or concerns Clarene Reamer, FNP-BC  [facility] [place]  09/15/2015 9:21 PM

## 2015-09-15 ENCOUNTER — Encounter: Payer: Self-pay | Admitting: Family Medicine

## 2016-03-07 ENCOUNTER — Encounter (HOSPITAL_BASED_OUTPATIENT_CLINIC_OR_DEPARTMENT_OTHER): Payer: Self-pay

## 2016-03-07 ENCOUNTER — Emergency Department (HOSPITAL_BASED_OUTPATIENT_CLINIC_OR_DEPARTMENT_OTHER)
Admission: EM | Admit: 2016-03-07 | Discharge: 2016-03-07 | Disposition: A | Discharge status: Home / Self Care | Payer: Managed Care, Other (non HMO) | Attending: Emergency Medicine | Admitting: Emergency Medicine

## 2016-03-07 DIAGNOSIS — I10 Essential (primary) hypertension: Secondary | ICD-10-CM | POA: Insufficient documentation

## 2016-03-07 DIAGNOSIS — Z7984 Long term (current) use of oral hypoglycemic drugs: Secondary | ICD-10-CM | POA: Insufficient documentation

## 2016-03-07 DIAGNOSIS — Z5181 Encounter for therapeutic drug level monitoring: Secondary | ICD-10-CM | POA: Insufficient documentation

## 2016-03-07 DIAGNOSIS — E119 Type 2 diabetes mellitus without complications: Secondary | ICD-10-CM | POA: Diagnosis not present

## 2016-03-07 DIAGNOSIS — Z79899 Other long term (current) drug therapy: Secondary | ICD-10-CM | POA: Diagnosis not present

## 2016-03-07 DIAGNOSIS — Z87891 Personal history of nicotine dependence: Secondary | ICD-10-CM | POA: Insufficient documentation

## 2016-03-07 DIAGNOSIS — R42 Dizziness and giddiness: Secondary | ICD-10-CM

## 2016-03-07 LAB — CBC WITH DIFFERENTIAL/PLATELET
Basophils Absolute: 0 10*3/uL (ref 0.0–0.1)
Basophils Relative: 0 %
Eosinophils Absolute: 0.1 10*3/uL (ref 0.0–0.7)
Eosinophils Relative: 2 %
HCT: 39.6 % (ref 36.0–46.0)
HEMOGLOBIN: 13.8 g/dL (ref 12.0–15.0)
LYMPHS ABS: 2.3 10*3/uL (ref 0.7–4.0)
LYMPHS PCT: 34 %
MCH: 28.8 pg (ref 26.0–34.0)
MCHC: 34.8 g/dL (ref 30.0–36.0)
MCV: 82.7 fL (ref 78.0–100.0)
Monocytes Absolute: 0.5 10*3/uL (ref 0.1–1.0)
Monocytes Relative: 7 %
NEUTROS PCT: 57 %
Neutro Abs: 3.9 10*3/uL (ref 1.7–7.7)
Platelets: 292 10*3/uL (ref 150–400)
RBC: 4.79 MIL/uL (ref 3.87–5.11)
RDW: 12.6 % (ref 11.5–15.5)
WBC: 6.8 10*3/uL (ref 4.0–10.5)

## 2016-03-07 LAB — URINE MICROSCOPIC-ADD ON: RBC / HPF: NONE SEEN RBC/hpf (ref 0–5)

## 2016-03-07 LAB — COMPREHENSIVE METABOLIC PANEL
ALBUMIN: 4.3 g/dL (ref 3.5–5.0)
ALT: 17 U/L (ref 14–54)
AST: 16 U/L (ref 15–41)
Alkaline Phosphatase: 82 U/L (ref 38–126)
Anion gap: 12 (ref 5–15)
BUN: 17 mg/dL (ref 6–20)
CHLORIDE: 95 mmol/L — AB (ref 101–111)
CO2: 27 mmol/L (ref 22–32)
Calcium: 10 mg/dL (ref 8.9–10.3)
Creatinine, Ser: 0.8 mg/dL (ref 0.44–1.00)
GFR calc Af Amer: >60 mL/min (ref >60)
GFR calc non Af Amer: >60 mL/min (ref >60)
GLUCOSE: 469 mg/dL — AB (ref 65–99)
POTASSIUM: 3.5 mmol/L (ref 3.5–5.1)
Sodium: 134 mmol/L — ABNORMAL LOW (ref 135–145)
Total Bilirubin: 0.5 mg/dL (ref 0.3–1.2)
Total Protein: 7.9 g/dL (ref 6.5–8.1)

## 2016-03-07 LAB — I-STAT VENOUS BLOOD GAS, ED
Acid-Base Excess: 2 mmol/L (ref 0.0–2.0)
BICARBONATE: 29.1 mmol/L — AB (ref 20.0–28.0)
O2 Saturation: 44 %
PCO2 VEN: 51.1 mmHg (ref 44.0–60.0)
PO2 VEN: 26 mmHg — AB (ref 32.0–45.0)
TCO2: 31 mmol/L (ref 0–100)
pH, Ven: 7.363 (ref 7.250–7.430)

## 2016-03-07 LAB — TROPONIN I: Troponin I: <0.03 ng/mL (ref <0.03)

## 2016-03-07 LAB — URINALYSIS, ROUTINE W REFLEX MICROSCOPIC
BILIRUBIN URINE: NEGATIVE
HGB URINE DIPSTICK: NEGATIVE
Ketones, ur: 15 mg/dL — AB
Leukocytes, UA: NEGATIVE
NITRITE: NEGATIVE
PROTEIN: NEGATIVE mg/dL
SPECIFIC GRAVITY, URINE: 1.031 — AB (ref 1.005–1.030)
pH: 5 (ref 5.0–8.0)

## 2016-03-07 LAB — LIPASE, BLOOD: Lipase: 18 U/L (ref 11–51)

## 2016-03-07 LAB — PROTIME-INR
INR: 0.97
PROTHROMBIN TIME: 12.9 s (ref 11.4–15.2)

## 2016-03-07 LAB — BRAIN NATRIURETIC PEPTIDE: B Natriuretic Peptide: 10.9 pg/mL (ref 0.0–100.0)

## 2016-03-07 LAB — CBG MONITORING, ED
GLUCOSE-CAPILLARY: 530 mg/dL — AB (ref 65–99)
Glucose-Capillary: 327 mg/dL — ABNORMAL HIGH (ref 65–99)

## 2016-03-07 MED ORDER — METFORMIN HCL 500 MG PO TABS: 500.0000 mg | ORAL_TABLET | Freq: Two times a day (BID) | ORAL | 0 refills | Status: DC

## 2016-03-07 MED ORDER — BLOOD GLUCOSE MONITOR KIT: PACK | 0 refills | Status: AC

## 2016-03-07 MED ORDER — SODIUM CHLORIDE 0.9 % IV BOLUS (SEPSIS)
1000.0000 mL | Freq: Once | INTRAVENOUS | Status: AC
  Administered 2016-03-07: 1000 mL via INTRAVENOUS

## 2016-03-07 MED FILL — ONE TOUCH ULTRA TEST STRIPS: 25 days supply | Qty: 100 | Fill #0

## 2016-03-07 MED FILL — metFORMIN HCL 500 MG TABS: 500 | 30 days supply | Qty: 60 | Fill #0

## 2016-03-07 MED FILL — ONE TOUCH DELICA 33G LANCET: 25 days supply | Qty: 100 | Fill #0

## 2016-03-07 MED FILL — ONE TOUCH ULTRA 2 GLUCOSE S: W/DEVICE | 14 days supply | Qty: 1 | Fill #0

## 2016-03-07 NOTE — ED Provider Notes (Signed)
[facility] DEPT [facility] Provider Note   CSN: [id number] Arrival date & time: 03/07/16  1245     History   Chief Complaint Chief Complaint  Patient presents with  . Dizziness    HPI Autumn Cook is a 55 y.o. female.  HPI Patient reports for about 2 weeks she's been feeling extra fatigued. She reports she sleeps a lot. Patient poor she has been drinking a lot of water. She says now she is drinking up to 1-2 gallons per day. She reports urinary frequency. No fevers no chills. She reports that she had previously been diagnosed as a borderline diabetic but has never required treatment. She is not having chest pain or dyspnea. Patient reports that she has been exercising and had rather rapid weight loss of about 18 pounds. Past Medical History:  Diagnosis Date  . Allergy   . Arthritis   . Hypertension   . Migraine     Patient Active Problem List   Diagnosis Date Noted  . Achilles tendonitis, bilateral 06/11/2015  . Chest pain with moderate risk for cardiac etiology 10/13/2014  . Hypercholesterolemia 10/13/2014  . Idiopathic scoliosis 08/18/2014  . Hypertension 09/11/2012  . Overweight (BMI 25.0-29.9) 09/11/2012  . Migraine, unspecified, without mention of intractable migraine without mention of status migrainosus 09/11/2012  . Hx of thyroid nodule 09/11/2012  . History of tobacco use 09/11/2012    History reviewed. No pertinent surgical history.  OB History    No data available       Home Medications    Prior to Admission medications   Medication Sig Start Date End Date Taking? Authorizing Provider  albuterol (PROVENTIL HFA;VENTOLIN HFA) 108 (90 Base) MCG/ACT inhaler Inhale 2 puffs into the lungs every 4 (four) hours as needed for wheezing or shortness of breath (cough, shortness of breath or wheezing.). 09/14/15   Elby Beck, FNP  amLODipine (NORVASC) 10 MG tablet Take 1 tablet (10 mg total) by mouth daily. 04/27/15   Ezekiel Slocumb, PA-C  atenolol  (TENORMIN) 50 MG tablet Take 1 tablet (50 mg total) by mouth daily. 04/27/15   Ezekiel Slocumb, PA-C  blood glucose meter kit and supplies KIT Dispense based on patient and insurance preference. Use up to four times daily as directed. (FOR ICD-9 250.00, 250.01). 03/07/16   Charlesetta Shanks, MD  hydrochlorothiazide (HYDRODIURIL) 25 MG tablet Take 1 tablet (25 mg total) by mouth daily. 04/27/15   Ezekiel Slocumb, PA-C  metFORMIN (GLUCOPHAGE) 500 MG tablet Take 1 tablet (500 mg total) by mouth 2 (two) times daily with a meal. 03/07/16   Charlesetta Shanks, MD    Family History Family History  Problem Relation Age of Onset  . Hypertension Father   . Cancer Father 74    lung cancer  . Stroke Father   . Heart disease Mother     arrhythmia  . Hypertension Mother   . Kidney failure Sister     Social History Social History  Substance Use Topics  . Smoking status: Former Research scientist (life sciences)  . Smokeless tobacco: Never Used  . Alcohol use No     Allergies   Asa [aspirin] and Penicillins   Review of Systems Review of Systems 10 Systems reviewed and are negative for acute change except as noted in the HPI.   Physical Exam Updated Vital Signs BP 137/91   Pulse 72   Temp 97.9 F (36.6 C) (Oral)   Resp 16   Ht 5' 6"  (1.676 m)   Wt 168  lb (76.2 kg)   LMP 10/06/2013 Comment: per patient periods STOPPED  SpO2 98%   BMI 27.12 kg/m   Physical Exam  Constitutional: She appears well-developed and well-nourished. No distress.  HENT:  Head: Normocephalic and atraumatic.  Nose: Nose normal.  Mouth/Throat: Oropharynx is clear and moist.  Eyes: Conjunctivae and EOM are normal.  Neck: Neck supple.  Cardiovascular: Normal rate and regular rhythm.   No murmur heard. Pulmonary/Chest: Effort normal and breath sounds normal. No respiratory distress.  Abdominal: Soft. There is no tenderness.  Musculoskeletal: She exhibits no edema or tenderness.  Lower extremities are in excellent Condition. Skin condition  excellent.  Neurological: She is alert. No cranial nerve deficit. She exhibits normal muscle tone. Coordination normal.  Skin: Skin is warm and dry.  Psychiatric: She has a normal mood and affect.  Nursing note and vitals reviewed.    ED Treatments / Results  Labs (all labs ordered are listed, but only abnormal results are displayed) Labs Reviewed  COMPREHENSIVE METABOLIC PANEL - Abnormal; Notable for the following:       Result Value   Sodium 134 (*)    Chloride 95 (*)    Glucose, Bld 469 (*)    All other components within normal limits  URINALYSIS, ROUTINE W REFLEX MICROSCOPIC (NOT AT [facility]) - Abnormal; Notable for the following:    Specific Gravity, Urine 1.031 (*)    Glucose, UA >1000 (*)    Ketones, ur 15 (*)    All other components within normal limits  URINE MICROSCOPIC-ADD ON - Abnormal; Notable for the following:    Squamous Epithelial / LPF 0-5 (*)    Bacteria, UA RARE (*)    All other components within normal limits  CBG MONITORING, ED - Abnormal; Notable for the following:    Glucose-Capillary 530 (*)    All other components within normal limits  I-STAT VENOUS BLOOD GAS, ED - Abnormal; Notable for the following:    pO2, Ven 26.0 (*)    Bicarbonate 29.1 (*)    All other components within normal limits  CBG MONITORING, ED - Abnormal; Notable for the following:    Glucose-Capillary 327 (*)    All other components within normal limits  LIPASE, BLOOD  TROPONIN I  BRAIN NATRIURETIC PEPTIDE  CBC WITH DIFFERENTIAL/PLATELET  PROTIME-INR  HEMOGLOBIN A1C    EKG  EKG Interpretation  Date/Time:  Tuesday March 07 2016 13:16:39 EDT Ventricular Rate:  68 PR Interval:    QRS Duration: 92 QT Interval:  399 QTC Calculation: 425 R Axis:   7 Text Interpretation:  Age not entered, assumed to be  55 years old for purpose of ECG interpretation Sinus rhythm Abnormal T, consider ischemia, anterior leads agree, more pronounced anterior T wave abnormality Confirmed by  Johnney Killian, MD, Jeannie Done [phone]) on 03/07/2016 1:24:09 PM       Radiology No results found.  Procedures Procedures (including critical care time)  Medications Ordered in ED Medications  sodium chloride 0.9 % bolus 1,000 mL (0 mLs Intravenous Stopped 03/07/16 1552)     Initial Impression / Assessment and Plan / ED Course  I have reviewed the triage vital signs and the nursing notes.  Pertinent labs & imaging results that were available during my care of the patient were reviewed by me and considered in my medical decision making (see chart for details).  Clinical Course    Final Clinical Impressions(s) / ED Diagnoses   Final diagnoses:  Type 2 diabetes mellitus without complication, without long-term  current use of insulin ([place])  Lightheadedness  Hyperglycemia is identified. Patient is not acidotic. She has normal renal function. Clinically patient is alert and well in appearance. At this time, patient will be started on metformin and advised for close follow-up with her primary care physician.  New Prescriptions New Prescriptions   BLOOD GLUCOSE METER KIT AND SUPPLIES KIT    Dispense based on patient and insurance preference. Use up to four times daily as directed. (FOR ICD-9 250.00, 250.01).   METFORMIN (GLUCOPHAGE) 500 MG TABLET    Take 1 tablet (500 mg total) by mouth 2 (two) times daily with a meal.     Charlesetta Shanks, MD 03/07/16 [phone]

## 2016-03-07 NOTE — ED Triage Notes (Signed)
C/o intermittent light headed x 1 week-vision changes x today-denies head injury-NAD-steady gait

## 2016-03-08 LAB — HEMOGLOBIN A1C
HEMOGLOBIN A1C: 11.5 % — AB (ref 4.8–5.6)
Mean Plasma Glucose: 283 mg/dL

## 2016-03-09 ENCOUNTER — Telehealth: Payer: Self-pay | Admitting: Emergency Medicine

## 2016-03-09 NOTE — Telephone Encounter (Signed)
Patient scheduled for 03/16/16 with Dr. Lorelei Pont

## 2016-03-09 NOTE — Telephone Encounter (Signed)
Yes - it will be fine

## 2016-03-09 NOTE — Telephone Encounter (Signed)
Relation to [id number] Call back number: [phone]   Reason for call:  Patient scheduled new patient appointment for 04/24/2016 and would like to be seen for an ED follow up due to her diabeties, please advise if this acute prior to new patient appoiontment is ok?

## 2016-03-10 ENCOUNTER — Emergency Department (HOSPITAL_BASED_OUTPATIENT_CLINIC_OR_DEPARTMENT_OTHER)
Admission: EM | Admit: 2016-03-10 | Discharge: 2016-03-10 | Disposition: A | Discharge status: Home / Self Care | Payer: Managed Care, Other (non HMO) | Attending: Emergency Medicine | Admitting: Emergency Medicine

## 2016-03-10 ENCOUNTER — Encounter (HOSPITAL_BASED_OUTPATIENT_CLINIC_OR_DEPARTMENT_OTHER): Payer: Self-pay | Admitting: *Deleted

## 2016-03-10 DIAGNOSIS — R197 Diarrhea, unspecified: Secondary | ICD-10-CM | POA: Insufficient documentation

## 2016-03-10 DIAGNOSIS — Z7984 Long term (current) use of oral hypoglycemic drugs: Secondary | ICD-10-CM | POA: Insufficient documentation

## 2016-03-10 DIAGNOSIS — Z87891 Personal history of nicotine dependence: Secondary | ICD-10-CM | POA: Insufficient documentation

## 2016-03-10 DIAGNOSIS — IMO0002 Reserved for concepts with insufficient information to code with codable children: Secondary | ICD-10-CM

## 2016-03-10 DIAGNOSIS — I10 Essential (primary) hypertension: Secondary | ICD-10-CM | POA: Insufficient documentation

## 2016-03-10 DIAGNOSIS — R739 Hyperglycemia, unspecified: Secondary | ICD-10-CM

## 2016-03-10 DIAGNOSIS — E1165 Type 2 diabetes mellitus with hyperglycemia: Secondary | ICD-10-CM | POA: Insufficient documentation

## 2016-03-10 DIAGNOSIS — E118 Type 2 diabetes mellitus with unspecified complications: Secondary | ICD-10-CM

## 2016-03-10 HISTORY — DX: Type 2 diabetes mellitus without complications: E11.9

## 2016-03-10 LAB — BASIC METABOLIC PANEL
Anion gap: 12 (ref 5–15)
BUN: 19 mg/dL (ref 6–20)
CALCIUM: 9.9 mg/dL (ref 8.9–10.3)
CO2: 21 mmol/L — ABNORMAL LOW (ref 22–32)
Chloride: 101 mmol/L (ref 101–111)
Creatinine, Ser: 0.83 mg/dL (ref 0.44–1.00)
GFR calc Af Amer: >60 mL/min (ref >60)
GLUCOSE: 395 mg/dL — AB (ref 65–99)
POTASSIUM: 3.4 mmol/L — AB (ref 3.5–5.1)
SODIUM: 134 mmol/L — AB (ref 135–145)

## 2016-03-10 LAB — CBC
HCT: 39.9 % (ref 36.0–46.0)
Hemoglobin: 14 g/dL (ref 12.0–15.0)
MCH: 28.7 pg (ref 26.0–34.0)
MCHC: 35.1 g/dL (ref 30.0–36.0)
MCV: 81.9 fL (ref 78.0–100.0)
PLATELETS: 336 10*3/uL (ref 150–400)
RBC: 4.87 MIL/uL (ref 3.87–5.11)
RDW: 12.6 % (ref 11.5–15.5)
WBC: 7.8 10*3/uL (ref 4.0–10.5)

## 2016-03-10 LAB — CBG MONITORING, ED
Glucose-Capillary: 378 mg/dL — ABNORMAL HIGH (ref 65–99)
Glucose-Capillary: 288 mg/dL — ABNORMAL HIGH (ref 65–99)

## 2016-03-10 LAB — TROPONIN I

## 2016-03-10 MED ORDER — AMOXICILLIN-POT CLAVULANATE 875-125 MG PO TABS: 1.0000 | ORAL_TABLET | Freq: Once | ORAL | Status: DC

## 2016-03-10 MED ORDER — SODIUM CHLORIDE 0.9 % IV BOLUS (SEPSIS)
1000.0000 mL | Freq: Once | INTRAVENOUS | Status: AC
  Administered 2016-03-10: 1000 mL via INTRAVENOUS

## 2016-03-10 MED ORDER — INSULIN REGULAR HUMAN 100 UNIT/ML IJ SOLN
8.0000 [IU] | Freq: Once | INTRAMUSCULAR | Status: AC
  Administered 2016-03-10: 8 [IU] via INTRAVENOUS
  Filled 2016-03-10: qty 1

## 2016-03-10 NOTE — ED Triage Notes (Addendum)
CBG 420 PTA.  Started on metformin on Tuesday.  Denies insulin use.  Dx with diabetes on Tuesday.  Reports feeling weak and tired.  Ambulatory.

## 2016-03-10 NOTE — ED Provider Notes (Signed)
[place] DEPT [facility] Provider Note   CSN: [id number] Arrival date & time: 03/10/16  1746   History   Chief Complaint Chief Complaint  Patient presents with  . Hyperglycemia   HPI  Autumn Cook is a 55 y.o. Female Here for her elevated glucose.   She was recently seen in the ED on 10/31 for dizziness. Was diagnosed with diabetes then and started on Metformin 500 mg twice a day and told to follow up with PCP in the outpatient setting.   She came in today because she is still feeling some dizziness in generalized weakness and get blood glucose was in the 500's this morning. She notes that she is drinking 2 gallons of water a day and have polyuria. She has been afraid to eat because of her recent diagnosis of diabetes. She is taking her metformin as prescribed and has been having some GI upset associated with that.   She also admits to intermittent chest pain that is centralized in her sternum. The chest pain feels like a tightness that comes and goes. Chest pain is not associated with ambulation. She cannot tell me the initial onset of chest pain.   Denies any focal weakness. Admits to intermittent headache, she has a history of migraines. Denies any visual changes, rhinorrhea, sore throat, cough, palpitations, edema. Denies any falls or loss of consciousness.    Past Medical History:  Diagnosis Date  . Allergy   . Arthritis   . Diabetes mellitus without complication ([place])   . Hypertension   . Migraine     Patient Active Problem List   Diagnosis Date Noted  . Achilles tendonitis, bilateral 06/11/2015  . Chest pain with moderate risk for cardiac etiology 10/13/2014  . Hypercholesterolemia 10/13/2014  . Idiopathic scoliosis 08/18/2014  . Hypertension 09/11/2012  . Overweight (BMI 25.0-29.9) 09/11/2012  . Migraine, unspecified, without mention of intractable migraine without mention of status migrainosus 09/11/2012  . Hx of thyroid nodule 09/11/2012  . History of tobacco  use 09/11/2012    History reviewed. No pertinent surgical history.  OB History    No data available       Home Medications    Prior to Admission medications   Medication Sig Start Date End Date Taking? Authorizing Provider  albuterol (PROVENTIL HFA;VENTOLIN HFA) 108 (90 Base) MCG/ACT inhaler Inhale 2 puffs into the lungs every 4 (four) hours as needed for wheezing or shortness of breath (cough, shortness of breath or wheezing.). 09/14/15   Elby Beck, FNP  amLODipine (NORVASC) 10 MG tablet Take 1 tablet (10 mg total) by mouth daily. 04/27/15   Ezekiel Slocumb, PA-C  atenolol (TENORMIN) 50 MG tablet Take 1 tablet (50 mg total) by mouth daily. 04/27/15   Ezekiel Slocumb, PA-C  blood glucose meter kit and supplies KIT Dispense based on patient and insurance preference. Use up to four times daily as directed. (FOR ICD-9 250.00, 250.01). 03/07/16   Charlesetta Shanks, MD  hydrochlorothiazide (HYDRODIURIL) 25 MG tablet Take 1 tablet (25 mg total) by mouth daily. 04/27/15   Ezekiel Slocumb, PA-C  metFORMIN (GLUCOPHAGE) 500 MG tablet Take 1 tablet (500 mg total) by mouth 2 (two) times daily with a meal. 03/07/16   Charlesetta Shanks, MD    Family History Family History  Problem Relation Age of Onset  . Hypertension Father   . Cancer Father 50    lung cancer  . Stroke Father   . Heart disease Mother     arrhythmia  .  Hypertension Mother   . Kidney failure Sister     Social History Social History  Substance Use Topics  . Smoking status: Former Research scientist (life sciences)  . Smokeless tobacco: Never Used  . Alcohol use No     Allergies   Asa [aspirin] and Penicillins   Review of Systems Review of Systems  Constitutional: Positive for fatigue. Negative for fever.  HENT: Negative for congestion and sore throat.   Eyes: Negative for visual disturbance.  Respiratory: Negative for chest tightness and shortness of breath.   Cardiovascular: Positive for chest pain.  Gastrointestinal: Positive for diarrhea.  Negative for abdominal distention, abdominal pain, constipation, nausea and vomiting.  Genitourinary: Positive for frequency.  Musculoskeletal: Negative for myalgias.  Neurological: Positive for dizziness and weakness.    Physical Exam Updated Vital Signs BP 133/83 (BP Location: Left Arm)   Pulse 75   Temp 98.2 F (36.8 C) (Oral)   Resp 20   Ht 5' 6"  (1.676 m)   Wt 74.8 kg   LMP 10/06/2013 Comment: per patient periods STOPPED  SpO2 96%   BMI 26.63 kg/m   Physical Exam  Constitutional: She is oriented to person, place, and time. She appears well-developed and well-nourished.  HENT:  Head: Normocephalic and atraumatic.  mildy dry mucous membranes  Eyes: Pupils are equal, round, and reactive to light.  Neck: Normal range of motion.  Cardiovascular: Normal rate, regular rhythm, normal heart sounds and intact distal pulses.   No murmur heard. Pulmonary/Chest: Effort normal and breath sounds normal. No respiratory distress. She has no wheezes.  Abdominal: Soft. Bowel sounds are normal. She exhibits no distension. There is no tenderness.  Musculoskeletal: Normal range of motion. She exhibits no edema, tenderness or deformity.  Neurological: She is alert and oriented to person, place, and time. No cranial nerve deficit. She exhibits normal muscle tone. Coordination normal.  Negative romberg  Skin: Skin is warm and dry. Capillary refill takes less than 2 seconds. No rash noted.  Psychiatric: She has a normal mood and affect. Her behavior is normal. Judgment and thought content normal.     ED Treatments / Results  Labs (all labs ordered are listed, but only abnormal results are displayed) Labs Reviewed  BASIC METABOLIC PANEL - Abnormal; Notable for the following:       Result Value   Sodium 134 (*)    Potassium 3.4 (*)    CO2 21 (*)    Glucose, Bld 395 (*)    All other components within normal limits  CBG MONITORING, ED - Abnormal; Notable for the following:     Glucose-Capillary 378 (*)    All other components within normal limits  CBG MONITORING, ED - Abnormal; Notable for the following:    Glucose-Capillary 288 (*)    All other components within normal limits  CBC  TROPONIN I    EKG  EKG Interpretation  Date/Time:  Friday March 10 2016 18:37:42 EDT Ventricular Rate:  75 PR Interval:    QRS Duration: 91 QT Interval:  381 QTC Calculation: 426 R Axis:   8 Text Interpretation:  Sinus rhythm Nonspecific T abnormalities, anterior leads Confirmed by DELO  MD, DOUGLAS ([id number]) on 03/10/2016 7:08:32 PM       Radiology No results found.  Procedures Procedures (including critical care time)  Medications Ordered in ED Medications  sodium chloride 0.9 % bolus 1,000 mL (0 mLs Intravenous Stopped 03/10/16 2019)  insulin regular (NOVOLIN R,HUMULIN R) 100 units/mL injection 8 Units (8 Units Intravenous Given 03/10/16  1854)     Initial Impression / Assessment and Plan / ED Course  I have reviewed the triage vital signs and the nursing notes.  Pertinent labs & imaging results that were available during my care of the patient were reviewed by me and considered in my medical decision making (see chart for details).  Clinical Course    Final Clinical Impressions(s) / ED Diagnoses   Final diagnoses:  Hyperglycemia  Uncontrolled type 2 diabetes mellitus with complication, without long-term current use of insulin [facility])  Patient presented with hyperglycemia. Glucose was 327 here initially and 395 on BMP. BMP normal AG and no signs of DKA. CBC normal. She was given 1 L bolus of normal saline and 8 units of Novolog. EKG and Troponin taken due to chest pain today. EKG showing normal sinus rhythm, initial troponin negative. Patient stable for discharge and has appointment with PCP next week for new diabetes diagnosis. Return precautions discussed.   Smitty Cords, MD [place], PGY-2    Carlyle Dolly, MD 03/11/16  9829    Carlyle Dolly, MD 03/11/16 6700    Veryl Speak, MD 03/12/16 [phone]

## 2016-03-10 NOTE — Discharge Instructions (Signed)
You were evaluated for increased blood sugar. Your sugar is high due to diabetes. Your heart and kidneys are normal. There are no signs of diabetic ketoacidosis. Please continue drinking plenty of water and eating low carbohydrate diet. It is very important that you follow up with your primary care doctor as scheduled for diabetes management. Continue taking Metformin as prescribed. Take care!

## 2016-03-16 ENCOUNTER — Ambulatory Visit (INDEPENDENT_AMBULATORY_CARE_PROVIDER_SITE_OTHER): Payer: Managed Care, Other (non HMO) | Admitting: Family Medicine

## 2016-03-16 VITALS — BP 107/66 | HR 71 | Temp 98.5°F | Ht 66.0 in | Wt 179.0 lb

## 2016-03-16 DIAGNOSIS — I10 Essential (primary) hypertension: Secondary | ICD-10-CM

## 2016-03-16 DIAGNOSIS — E1165 Type 2 diabetes mellitus with hyperglycemia: Secondary | ICD-10-CM

## 2016-03-16 DIAGNOSIS — IMO0001 Reserved for inherently not codable concepts without codable children: Secondary | ICD-10-CM

## 2016-03-16 LAB — GLUCOSE, POCT (MANUAL RESULT ENTRY): POC Glucose: 398 mg/dl — AB (ref 70–99)

## 2016-03-16 MED ORDER — BASAGLAR KWIKPEN 100 UNIT/ML ~~LOC~~ SOPN: 10.0000 [IU] | PEN_INJECTOR | Freq: Every day | SUBCUTANEOUS | Status: DC

## 2016-03-16 MED ORDER — PEN NEEDLES 32G X 4 MM MISC: 1.0000 | Freq: Every day | 3 refills | Status: DC

## 2016-03-16 NOTE — Patient Instructions (Signed)
It was nice to meet you today- we will get your diabetes figured out together!   We are going to refer you to a diabetes education class Start on your once daily insulin- basiglar- at 10 units once a day We will increase your basiglar by 2 units every 2 days as long as your fasting morning glucose is over 150.  If the metformin is causing overly bothersome side effects reduce use to one a day for now Your BP is on the low side.  Please cut your amlodipine in half and we may stop it totally at our next visit  Please see me in 7- 10 days Please check out the ADA website

## 2016-03-16 NOTE — Progress Notes (Signed)
[place] at [facility] [address], [place], [place] [address] [phone]  Date:  03/16/2016   Name:  Autumn Cook   DOB:  20-Dec-1960   MRN:  [id number]  PCP:  Jenny Reichmann, MD    Chief Complaint: Hospitalization Follow-up (Pt here for ER f/u. Pt was seen on 10/31 and 11/3, both visits were for diabetes. Pt states that she still feels a little weak and glucose readings are still elevated ranging in the 300-400's. Declined flu vaccine today. )   History of Present Illness:  Autumn Cook is a 55 y.o. very pleasant female patient who presents with the following:  She is here today as a new patient to me-  She was in the ER on 10/31 and again on 11/3 with hyperglycemia/ acute presentation of DM 2.  It appears that she had been told she had mild hyperglycemia in the past, but 10/31 was the first time she really learned that she has DM.  She was started on metformin, but returned on 11/3 with continued very high glucose readings - she was treated with saline and novolog just in the ER.  She is now taking metformin 500 BID.  She is tolerating this pretty well but is having some diarrhea and GI upset  She is on atenolol, HCTZ and amlodipine also.   She does take the atenolol for migraine prophylaxis and not HTN per her report.    She notes that she is feeling a bit better. Her vision is getting better and she has an eye doctor appt coming up She has been checking her glucose 4x a day right now.  Her numbers are still running 200- 400  She is not aware of any family history of DM- she has generally been healthy in the past and this is her first major illness.  She is s/p menopause   BP Readings from Last 3 Encounters:  03/16/16 107/66  03/10/16 133/83  03/07/16 137/91     Lab Results  Component Value Date   HGBA1C 11.5 (H) 03/07/2016     Patient Active Problem List   Diagnosis Date Noted  . Achilles tendonitis, bilateral  06/11/2015  . Chest pain with moderate risk for cardiac etiology 10/13/2014  . Hypercholesterolemia 10/13/2014  . Idiopathic scoliosis 08/18/2014  . Hypertension 09/11/2012  . Overweight (BMI 25.0-29.9) 09/11/2012  . Migraine, unspecified, without mention of intractable migraine without mention of status migrainosus 09/11/2012  . Hx of thyroid nodule 09/11/2012  . History of tobacco use 09/11/2012    Past Medical History:  Diagnosis Date  . Allergy   . Arthritis   . Diabetes mellitus without complication ([place])   . Hypertension   . Migraine     No past surgical history on file.  Social History  Substance Use Topics  . Smoking status: Former Research scientist (life sciences)  . Smokeless tobacco: Never Used  . Alcohol use No    Family History  Problem Relation Age of Onset  . Hypertension Father   . Cancer Father 82    lung cancer  . Stroke Father   . Heart disease Mother     arrhythmia  . Hypertension Mother   . Kidney failure Sister     Allergies  Allergen Reactions  . Asa [Aspirin] Other (See Comments)    GI upset   . Penicillins Hives    Medication list has been reviewed and updated.  Current Outpatient Prescriptions on  File Prior to Visit  Medication Sig Dispense Refill  . albuterol (PROVENTIL HFA;VENTOLIN HFA) 108 (90 Base) MCG/ACT inhaler Inhale 2 puffs into the lungs every 4 (four) hours as needed for wheezing or shortness of breath (cough, shortness of breath or wheezing.). 1 Inhaler 1  . amLODipine (NORVASC) 10 MG tablet Take 1 tablet (10 mg total) by mouth daily. 90 tablet 3  . atenolol (TENORMIN) 50 MG tablet Take 1 tablet (50 mg total) by mouth daily. 90 tablet 3  . blood glucose meter kit and supplies KIT Dispense based on patient and insurance preference. Use up to four times daily as directed. (FOR ICD-9 250.00, 250.01). 1 each 0  . hydrochlorothiazide (HYDRODIURIL) 25 MG tablet Take 1 tablet (25 mg total) by mouth daily. 90 tablet 3  . metFORMIN (GLUCOPHAGE) 500 MG  tablet Take 1 tablet (500 mg total) by mouth 2 (two) times daily with a meal. 60 tablet 0   No current facility-administered medications on file prior to visit.     Review of Systems:  As per HPI- otherwise negative.   Physical Examination: Vitals:   03/16/16 1309  BP: 107/66  Pulse: 71  Temp: 98.5 F (36.9 C)   Vitals:   03/16/16 1309  Weight: 179 lb (81.2 kg)  Height: 5' 6"  (1.676 m)   Body mass index is 28.89 kg/m. Ideal Body Weight: Weight in (lb) to have BMI = 25: 154.6  GEN: WDWN, NAD, Non-toxic, A & O x 3, overweight, otherwise looks well HEENT: Atraumatic, Normocephalic. Neck supple. No masses, No LAD. Ears and Nose: No external deformity. CV: RRR, No M/G/R. No JVD. No thrill. No extra heart sounds. PULM: CTA B, no wheezes, crackles, rhonchi. No retractions. No resp. distress. No accessory muscle use. ABD: S, NT, ND. No rebound. No HSM.  Benign belly EXTR: No c/c/e NEURO Normal gait.  PSYCH: Normally interactive. Conversant. Not depressed or anxious appearing.  Calm demeanor.   Checked her glucose here- 398  Discussed insulin treatment with pt and her mom who is with her today- started her on basglar at 10 u/d.  Barnet Pall went over use of the pen and gave her the first dose in clinic today Assessment and Plan: Uncontrolled diabetes mellitus type 2 without complications, unspecified long term insulin use status ([place]) - Plan: POCT glucose (manual entry), Ambulatory referral to diabetic education, Insulin Pen Needle (PEN NEEDLES) 32G X 4 MM MISC, Insulin Glargine (BASAGLAR KWIKPEN) 100 UNIT/ML SOPN  Essential hypertension  Here today as a new pt to establish care- she has newly dx DM.  Her A1c is quite high and she is still running very high sugars with metformin. Will add insulin glargine at 10 u a day, titrate up by 2 units every 2 days Her BP is on the low side, would like to change to an ace.  Cut amlodipinein 1/2 for now, will likely stop and change to ace at next  visit Answered all questions from pt and her mom, referral to diabetes education Follow-up in one week  Signed Lamar Blinks, MD

## 2016-03-16 NOTE — Progress Notes (Signed)
Pre visit review using our clinic review tool, if applicable. No additional management support is needed unless otherwise documented below in the visit note. 

## 2016-03-23 ENCOUNTER — Ambulatory Visit (INDEPENDENT_AMBULATORY_CARE_PROVIDER_SITE_OTHER): Payer: Managed Care, Other (non HMO) | Admitting: Family Medicine

## 2016-03-23 ENCOUNTER — Encounter: Payer: Self-pay | Admitting: Family Medicine

## 2016-03-23 VITALS — BP 122/82 | HR 72 | Temp 98.1°F | Ht 66.0 in | Wt 184.4 lb

## 2016-03-23 DIAGNOSIS — I10 Essential (primary) hypertension: Secondary | ICD-10-CM | POA: Diagnosis not present

## 2016-03-23 DIAGNOSIS — E1165 Type 2 diabetes mellitus with hyperglycemia: Secondary | ICD-10-CM

## 2016-03-23 DIAGNOSIS — IMO0001 Reserved for inherently not codable concepts without codable children: Secondary | ICD-10-CM

## 2016-03-23 NOTE — Progress Notes (Signed)
[place] at [facility] [address] [place], [place] [address] [phone]  Date:  03/23/2016   Name:  Autumn Cook   DOB:  [birth year]   MRN:  [id number]  PCP:  Jenny Reichmann, MD    Chief Complaint: No chief complaint on file.   History of Present Illness:  Autumn Cook is a 55 y.o. very pleasant female patient who presents with the following:  Here today for a follow-up visit.  She was seen here one week ago with the following HPI: She is here today as a new patient to me-  She was in the ER on 10/31 and again on 11/3 with hyperglycemia/ acute presentation of DM 2.  It appears that she had been told she had mild hyperglycemia in the past, but 10/31 was the first time she really learned that she has DM.  She was started on metformin, but returned on 11/3 with continued very high glucose readings - she was treated with saline and novolog just in the ER.  She is now taking metformin 500 BID.  She is tolerating this pretty well but is having some diarrhea and GI upset She is on atenolol, HCTZ and amlodipine also.   She does take the atenolol for migraine prophylaxis and not HTN per her report.   She notes that she is feeling a bit better. Her vision is getting better and she has an eye doctor appt coming up She has been checking her glucose 4x a day right now.  Her numbers are still running 200- 400 She is not aware of any family history of DM- she has generally been healthy in the past and this is her first major illness.   At that visit we started her on insulin glargine at 10 units a day with plans to titrate up by 2 units every 2 days.  She is checking her glucose about 2x a day.   She brings in her glucose meter.  Her vision is getting better.  She is seeing her eye doctor tomorrow  Her husband had a kidney transplant.    glucose this am 317, yesterday 377, 207, 290, day prior 412, 398.  Prior to 11/14 she had several  readings in the 400s and even 500s.   She does have some diarrhea from the metformin still but this is not intolerable She declines a pneumonia and flu shot today  Lab Results  Component Value Date   HGBA1C 11.5 (H) 03/07/2016     Patient Active Problem List   Diagnosis Date Noted  . Uncontrolled diabetes mellitus type 2 without complications ([place]) 34/19/6222  . Achilles tendonitis, bilateral 06/11/2015  . Chest pain with moderate risk for cardiac etiology 10/13/2014  . Hypercholesterolemia 10/13/2014  . Idiopathic scoliosis 08/18/2014  . Hypertension 09/11/2012  . Overweight (BMI 25.0-29.9) 09/11/2012  . Migraine, unspecified, without mention of intractable migraine without mention of status migrainosus 09/11/2012  . Hx of thyroid nodule 09/11/2012  . History of tobacco use 09/11/2012    Past Medical History:  Diagnosis Date  . Allergy   . Arthritis   . Diabetes mellitus without complication ([place])   . Hypertension   . Migraine     No past surgical history on file.  Social History  Substance Use Topics  . Smoking status: Former Research scientist (life sciences)  . Smokeless tobacco: Never Used  . Alcohol use No    Family History  Problem Relation Age  of Onset  . Hypertension Father   . Cancer Father 81    lung cancer  . Stroke Father   . Heart disease Mother     arrhythmia  . Hypertension Mother   . Kidney failure Sister     Allergies  Allergen Reactions  . Asa [Aspirin] Other (See Comments)    GI upset   . Penicillins Hives    Medication list has been reviewed and updated.  Current Outpatient Prescriptions on File Prior to Visit  Medication Sig Dispense Refill  . albuterol (PROVENTIL HFA;VENTOLIN HFA) 108 (90 Base) MCG/ACT inhaler Inhale 2 puffs into the lungs every 4 (four) hours as needed for wheezing or shortness of breath (cough, shortness of breath or wheezing.). 1 Inhaler 1  . amLODipine (NORVASC) 10 MG tablet Take 1 tablet (10 mg total) by mouth daily. 90 tablet 3  .  atenolol (TENORMIN) 50 MG tablet Take 1 tablet (50 mg total) by mouth daily. 90 tablet 3  . blood glucose meter kit and supplies KIT Dispense based on patient and insurance preference. Use up to four times daily as directed. (FOR ICD-9 250.00, 250.01). 1 each 0  . hydrochlorothiazide (HYDRODIURIL) 25 MG tablet Take 1 tablet (25 mg total) by mouth daily. 90 tablet 3  . Insulin Glargine (BASAGLAR KWIKPEN) 100 UNIT/ML SOPN Inject 0.1 mLs (10 Units total) into the skin at bedtime. Adjust as directed- sample given today    . Insulin Pen Needle (PEN NEEDLES) 32G X 4 MM MISC 1 each by Does not apply route daily. 100 each 3  . metFORMIN (GLUCOPHAGE) 500 MG tablet Take 1 tablet (500 mg total) by mouth 2 (two) times daily with a meal. 60 tablet 0  . ONE TOUCH ULTRA TEST test strip Check sugar 4 times daily    . ONETOUCH DELICA LANCETS 94R MISC Check sugar four times daily     No current facility-administered medications on file prior to visit.     Review of Systems:  As per HPI- otherwise negative.   Physical Examination: Vitals:   03/23/16 0910  BP: 122/82  Pulse: 72  Temp: 98.1 F (36.7 C)   Ideal Body Weight:    GEN: WDWN, NAD, Non-toxic, A & O x 3, obese, looks well HEENT: Atraumatic, Normocephalic. Neck supple. No masses, No LAD.  Bilateral TM wnl, oropharynx normal.  PEERL,EOMI.   Ears and Nose: No external deformity. CV: RRR, No M/G/R. No JVD. No thrill. No extra heart sounds. PULM: CTA B, no wheezes, crackles, rhonchi. No retractions. No resp. distress. No accessory muscle use. EXTR: No c/c/e NEURO Normal gait.  PSYCH: Normally interactive. Conversant. Not depressed or anxious appearing.  Calm demeanor.    Assessment and Plan: Uncontrolled diabetes mellitus type 2 without complications, unspecified long term insulin use status ([place])  Essential hypertension  BP well controlled today She is titrating up her insulin glargine daily- glucose is improving gradually but her readings  are still in the 300s consistently.  Will have her increase her titration - increase by 2 units each day until FBG is approx 150.  She will contact me in one week with an update She has a visit planned next month; will plan to do fasting labs at that time  Signed Lamar Blinks, MD

## 2016-03-23 NOTE — Patient Instructions (Signed)
It was very good to see you again today- I think you are making good progress.  You can start going up on your insulin by 2 units every day instead of every 2 days. Continue to monitor your fasting morning glucose; when you get to around 150 you can hold the insulin dose!  Please send me an email in about one week with an update about your blood sugars and your insulin dose We will see you next month for a visit and fasting labs please

## 2016-03-23 NOTE — Progress Notes (Signed)
Pre visit review using our clinic review tool, if applicable. No additional management support is needed unless otherwise documented below in the visit note. 

## 2016-04-02 ENCOUNTER — Other Ambulatory Visit: Payer: Self-pay | Admitting: Family Medicine

## 2016-04-02 DIAGNOSIS — IMO0001 Reserved for inherently not codable concepts without codable children: Secondary | ICD-10-CM

## 2016-04-02 DIAGNOSIS — E1165 Type 2 diabetes mellitus with hyperglycemia: Principal | ICD-10-CM

## 2016-04-03 MED ORDER — BASAGLAR KWIKPEN 100 UNIT/ML ~~LOC~~ SOPN: 10.0000 [IU] | PEN_INJECTOR | Freq: Every day | SUBCUTANEOUS | 3 refills | Status: DC

## 2016-04-04 ENCOUNTER — Encounter: Payer: Self-pay | Admitting: Family Medicine

## 2016-04-04 DIAGNOSIS — E1165 Type 2 diabetes mellitus with hyperglycemia: Principal | ICD-10-CM

## 2016-04-04 DIAGNOSIS — IMO0001 Reserved for inherently not codable concepts without codable children: Secondary | ICD-10-CM

## 2016-04-04 MED ORDER — ONETOUCH DELICA LANCETS 33G MISC: 99 refills | Status: DC

## 2016-04-04 MED ORDER — ONETOUCH ULTRA BLUE VI STRP: ORAL_STRIP | 99 refills | Status: DC

## 2016-04-04 MED ORDER — PEN NEEDLES 32G X 4 MM MISC: 1.0000 | Freq: Every day | 3 refills | Status: DC

## 2016-04-04 NOTE — Addendum Note (Signed)
Addended by: Lamar Blinks C on: 04/04/2016 05:56 PM   Modules accepted: Orders

## 2016-04-10 ENCOUNTER — Encounter: Payer: Self-pay | Admitting: Family Medicine

## 2016-04-10 LAB — HM DIABETES EYE EXAM

## 2016-04-18 ENCOUNTER — Ambulatory Visit: Payer: Managed Care, Other (non HMO)

## 2016-04-21 ENCOUNTER — Encounter: Payer: Self-pay | Admitting: Behavioral Health

## 2016-04-21 ENCOUNTER — Telehealth: Payer: Self-pay | Admitting: Behavioral Health

## 2016-04-21 NOTE — Telephone Encounter (Signed)
Pre-Visit Call completed with patient and chart updated.   Pre-Visit Info documented in Specialty Comments under [facility].    

## 2016-04-24 ENCOUNTER — Ambulatory Visit (INDEPENDENT_AMBULATORY_CARE_PROVIDER_SITE_OTHER): Payer: Managed Care, Other (non HMO) | Admitting: Family Medicine

## 2016-04-24 ENCOUNTER — Encounter: Payer: Self-pay | Admitting: Family Medicine

## 2016-04-24 VITALS — BP 138/88 | HR 87 | Temp 98.1°F | Ht 66.0 in | Wt 185.0 lb

## 2016-04-24 DIAGNOSIS — E1165 Type 2 diabetes mellitus with hyperglycemia: Secondary | ICD-10-CM | POA: Diagnosis not present

## 2016-04-24 DIAGNOSIS — IMO0001 Reserved for inherently not codable concepts without codable children: Secondary | ICD-10-CM

## 2016-04-24 DIAGNOSIS — Z1389 Encounter for screening for other disorder: Secondary | ICD-10-CM | POA: Diagnosis not present

## 2016-04-24 DIAGNOSIS — Z130 Encounter for screening for diseases of the blood and blood-forming organs and certain disorders involving the immune mechanism: Secondary | ICD-10-CM

## 2016-04-24 DIAGNOSIS — Z13220 Encounter for screening for lipoid disorders: Secondary | ICD-10-CM

## 2016-04-24 DIAGNOSIS — I10 Essential (primary) hypertension: Secondary | ICD-10-CM

## 2016-04-24 MED ORDER — METFORMIN HCL 500 MG PO TABS: 500.0000 mg | ORAL_TABLET | Freq: Every day | ORAL | 3 refills | Status: DC

## 2016-04-24 NOTE — Progress Notes (Signed)
[facility] [address], [place], [place] [address] [phone]  Date:  04/24/2016   Name:  Autumn Cook   DOB:  1960/08/12   MRN:  [id number]  PCP:  Lamar Blinks, MD    Chief Complaint: Establish Care (Pt here to est care, )   History of Present Illness:  Autumn Cook is a 55 y.o. very pleasant female patient who presents with the following:  I have seen this pt a couple of times last month for uncontrolled DM- here today for a follow-up visit We started her on insulin glargine and titrated for her FBG  Lab Results  Component Value Date   HGBA1C 11.5 (H) 03/07/2016   Needs urine microalbumin, foot exam today.    She is on 18 units of insulin and her metformin now for glucose control She has not noted any low readings  She is trying to eat more vegetables, has reduced the sugar that she takes in her tea Her energy level is good  She did twist her ankle a few days ago and has some left ankle pain- she is able to walk ok  Wt Readings from Last 3 Encounters:  04/24/16 185 lb (83.9 kg)  03/23/16 184 lb 6.4 oz (83.6 kg)  03/16/16 179 lb (81.2 kg)   No symptoms of low blood sugar Declines a flu shot today Mood is normal    Patient Active Problem List   Diagnosis Date Noted  . Uncontrolled diabetes mellitus type 2 without complications ([facility]) 62/83/1517  . Achilles tendonitis, bilateral 06/11/2015  . Chest pain with moderate risk for cardiac etiology 10/13/2014  . Hypercholesterolemia 10/13/2014  . Idiopathic scoliosis 08/18/2014  . Hypertension 09/11/2012  . Overweight (BMI 25.0-29.9) 09/11/2012  . Migraine, unspecified, without mention of intractable migraine without mention of status migrainosus 09/11/2012  . Hx of thyroid nodule 09/11/2012  . History of tobacco use 09/11/2012    Past Medical History:  Diagnosis Date  . Allergy   . Arthritis   . Diabetes mellitus without complication ([facility])    . Hypertension   . Migraine     No past surgical history on file.  Social History  Substance Use Topics  . Smoking status: Former Research scientist (life sciences)  . Smokeless tobacco: Never Used  . Alcohol use No    Family History  Problem Relation Age of Onset  . Hypertension Father   . Cancer Father 98    lung cancer  . Stroke Father   . Heart disease Mother     arrhythmia  . Hypertension Mother   . Kidney failure Sister     Allergies  Allergen Reactions  . Asa [Aspirin] Other (See Comments)    GI upset   . Penicillins Hives    Medication list has been reviewed and updated.  Current Outpatient Prescriptions on File Prior to Visit  Medication Sig Dispense Refill  . albuterol (PROVENTIL HFA;VENTOLIN HFA) 108 (90 Base) MCG/ACT inhaler Inhale 2 puffs into the lungs every 4 (four) hours as needed for wheezing or shortness of breath (cough, shortness of breath or wheezing.). 1 Inhaler 1  . atenolol (TENORMIN) 50 MG tablet Take 1 tablet (50 mg total) by mouth daily. 90 tablet 3  . blood glucose meter kit and supplies KIT Dispense based on patient and insurance preference. Use up to four times daily as directed. (FOR ICD-9 250.00, 250.01). 1 each 0  . hydrochlorothiazide (HYDRODIURIL) 25 MG tablet Take  1 tablet (25 mg total) by mouth daily. 90 tablet 3  . Insulin Glargine (BASAGLAR KWIKPEN) 100 UNIT/ML SOPN Inject 0.1 mLs (10 Units total) into the skin at bedtime. Adjust as directed- sample given today 15 mL 3  . Insulin Pen Needle (PEN NEEDLES) 32G X 4 MM MISC 1 each by Does not apply route daily. 100 each 3  . metFORMIN (GLUCOPHAGE) 500 MG tablet Take 1 tablet (500 mg total) by mouth 2 (two) times daily with a meal. 60 tablet 0  . ONE TOUCH ULTRA TEST test strip Check sugar 2 times daily 100 each prn  . ONETOUCH DELICA LANCETS 99I MISC Check sugar 2 times daily 100 each prn   No current facility-administered medications on file prior to visit.     Review of Systems:  As per HPI- otherwise  negative.  No fever, chills, CP, SOB, rash   Physical Examination Blood pressure 138/88, pulse 87, temperature 98.1 F (36.7 C), temperature source Oral, height 5' 6"  (1.676 m), weight 185 lb (83.9 kg), last menstrual period 10/06/2013, SpO2 98 %. : Body mass index is 29.86 kg/m.  GEN: WDWN, NAD, Non-toxic, A & O x 3, overweight, looks well HEENT: Atraumatic, Normocephalic. Neck supple. No masses, No LAD. Ears and Nose: No external deformity. CV: RRR, No M/G/R. No JVD. No thrill. No extra heart sounds. PULM: CTA B, no wheezes, crackles, rhonchi. No retractions. No resp. distress. No accessory muscle use. EXTR: No c/c/e NEURO Normal gait.  PSYCH: Normally interactive. Conversant. Not depressed or anxious appearing.  Calm demeanor.    Assessment and Plan: Essential hypertension - Plan: Comprehensive metabolic panel  Uncontrolled diabetes mellitus type 2 without complications, unspecified long term insulin use status (HCC) - Plan: metFORMIN (GLUCOPHAGE) 500 MG tablet, Comprehensive metabolic panel, Lipid panel, CBC, Hemoglobin A1c, Urine Microalbumin w/creat. ratio  Screening for hyperlipidemia - Plan: Lipid panel  Screening for hematuria or proteinuria - Plan: Urine Microalbumin w/creat. ratio  Screening for deficiency anemia - Plan: CBC  Here today to recheck BP controlled Her glucose is looking much better with current regimen She will come in for fasting labs in January- too early to repeat A1c today and she is not fasting today Will plan further follow- up pending labs. She will report any low blood sugar readings   Signed Lamar Blinks, MD

## 2016-04-24 NOTE — Patient Instructions (Signed)
It was good to see you today- your glucose numbers look good Please come in for fasting labs in mid January so we can check your A1c If you have any low blood sugars please let me know Please get a mammogram soon- you may be able to do this today on the ground floor

## 2016-04-24 NOTE — Progress Notes (Signed)
Pre visit review using our clinic review tool, if applicable. No additional management support is needed unless otherwise documented below in the visit note. 

## 2016-04-25 ENCOUNTER — Ambulatory Visit: Payer: Managed Care, Other (non HMO)

## 2016-04-29 ENCOUNTER — Other Ambulatory Visit: Payer: Self-pay | Admitting: Physician Assistant

## 2016-04-29 DIAGNOSIS — I10 Essential (primary) hypertension: Secondary | ICD-10-CM

## 2016-05-02 ENCOUNTER — Ambulatory Visit: Payer: Managed Care, Other (non HMO)

## 2016-05-09 ENCOUNTER — Encounter: Payer: Self-pay | Admitting: Skilled Nursing Facility1

## 2016-05-09 ENCOUNTER — Encounter: Payer: Managed Care, Other (non HMO) | Attending: Family Medicine | Admitting: Skilled Nursing Facility1

## 2016-05-09 DIAGNOSIS — E119 Type 2 diabetes mellitus without complications: Secondary | ICD-10-CM | POA: Diagnosis not present

## 2016-05-09 DIAGNOSIS — Z713 Dietary counseling and surveillance: Secondary | ICD-10-CM | POA: Diagnosis not present

## 2016-05-09 NOTE — Patient Instructions (Addendum)
-  Tell your doctor the metformin is causing diarrhea  --Always bring your meter with you everywhere you go -Always Properly dispose of your needles:  -Discard in a hard plastic/metal container with a lid (something the needle can't puncture)  -Write Do Not Recycle on the outside of the container  -Example: A laundry detergent bottle -Never use the same needle more than once -Eat 2-3 carbohydrate choices for each meal and 1 for each snack -A meal: carbohydrates, protein, vegetable -A snack: A Fruit OR Vegetable AND Protein  -Try to be more active -Always pay attention to your body keeping watchful of possible low blood sugar (below 70) or high blood sugar (above 200)  -Check your feet every day looking for anything that was not there the day before

## 2016-05-09 NOTE — Progress Notes (Signed)
Diabetes Self-Management Education  Visit Type: First/Initial  Appt. Start Time: 8:30 Appt. End Time: 9:47  05/09/2016  [name], identified by name and date of birth, is a 56 y.o. female with a diagnosis of Diabetes: Type 2.   ASSESSMENT  Height 5\' 5"  (1.651 m), last menstrual period 10/06/2013. There is no height or weight on file to calculate BMI. Pt states she discovered she had diabetes when she went to the emergency department. Pt states every couple of a days she has diarrhea for 1-2 hours.  Pt states she has experienced low blood sugar 3 times. Pt states her fasting is about 116-120.      Diabetes Self-Management Education - 05/09/16 0844      Visit Information   Visit Type First/Initial     Initial Visit   Diabetes Type Type 2   Are you currently following a meal plan? No   Are you taking your medications as prescribed? Yes   Date Diagnosed a month ago     Health Coping   How would you rate your overall health? Good     Psychosocial Assessment   Patient Belief/Attitude about Diabetes Motivated to manage diabetes     Pre-Education Assessment   Patient understands the diabetes disease and treatment process. Needs Instruction   Patient understands incorporating nutritional management into lifestyle. Needs Instruction   Patient undertands incorporating physical activity into lifestyle. Needs Instruction   Patient understands using medications safely. Needs Instruction   Patient understands monitoring blood glucose, interpreting and using results Needs Instruction   Patient understands prevention, detection, and treatment of acute complications. Needs Instruction   Patient understands prevention, detection, and treatment of chronic complications. Needs Instruction   Patient understands how to develop strategies to address psychosocial issues. Needs Instruction   Patient understands how to develop strategies to promote health/change behavior. Needs Instruction      Complications   Last HgB A1C per patient/outside source 11.5 %   How often do you check your blood sugar? 1-2 times/day   Have you had a dilated eye exam in the past 12 months? Yes   Have you had a dental exam in the past 12 months? No   Are you checking your feet? Yes   How many days per week are you checking your feet? 7     Dietary Intake   Breakfast egg----canned fruit and cottage cheese   Snack (morning) fruit   Lunch salad-------hamburger----chicken sandwich    Snack (afternoon) fruit   Dinner pasta and salad    Beverage(s) water, diet soda, hot tea     Exercise   Exercise Type Light (walking / raking leaves)   How many days per week to you exercise? 2   How many minutes per day do you exercise? 30   Total minutes per week of exercise 60     Patient Education   Previous Diabetes Education No   Disease state  Definition of diabetes, type 1 and 2, and the diagnosis of diabetes   Nutrition management  Role of diet in the treatment of diabetes and the relationship between the three main macronutrients and blood glucose level;Food label reading, portion sizes and measuring food.;Carbohydrate counting;Reviewed blood glucose goals for pre and post meals and how to evaluate the patients' food intake on their blood glucose level.;Information on hints to eating out and maintain blood glucose control.   Physical activity and exercise  Role of exercise on diabetes management, blood pressure control and cardiac health.  Monitoring Purpose and frequency of SMBG.;Yearly dilated eye exam;Daily foot exams;Identified appropriate SMBG and/or A1C goals.   Acute complications Taught treatment of hypoglycemia - the 15 rule.   Chronic complications Relationship between chronic complications and blood glucose control;Dental care;Lipid levels, blood glucose control and heart disease;Assessed and discussed foot care and prevention of foot problems;Retinopathy and reason for yearly dilated eye exams    Psychosocial adjustment Worked with patient to identify barriers to care and solutions;Role of stress on diabetes     Individualized Goals (developed by patient)   Nutrition Follow meal plan discussed;Adjust meds/carbs with exercise as discussed   Physical Activity Exercise 1-2 times per week;30 minutes per day   Monitoring  test my blood glucose as discussed;test blood glucose pre and post meals as discussed     Post-Education Assessment   Patient understands the diabetes disease and treatment process. Demonstrates understanding / competency   Patient understands incorporating nutritional management into lifestyle. Demonstrates understanding / competency   Patient undertands incorporating physical activity into lifestyle. Demonstrates understanding / competency   Patient understands using medications safely. Demonstrates understanding / competency   Patient understands monitoring blood glucose, interpreting and using results Demonstrates understanding / competency   Patient understands prevention, detection, and treatment of acute complications. Demonstrates understanding / competency   Patient understands prevention, detection, and treatment of chronic complications. Demonstrates understanding / competency   Patient understands how to develop strategies to address psychosocial issues. Demonstrates understanding / competency   Patient understands how to develop strategies to promote health/change behavior. Demonstrates understanding / competency     Outcomes   Expected Outcomes Demonstrated interest in learning. Expect positive outcomes   Future DMSE PRN   Program Status Completed      Individualized Plan for Diabetes Self-Management Training:   Learning Objective:  Patient will have a greater understanding of diabetes self-management. Patient education plan is to attend individual and/or group sessions per assessed needs and concerns.   Plan:   Patient Instructions  -Tell your  doctor the metformin is causing diarrhea  --Always bring your meter with you everywhere you go -Always Properly dispose of your needles:  -Discard in a hard plastic/metal container with a lid (something the needle can't puncture)  -Write Do Not Recycle on the outside of the container  -Example: A laundry detergent bottle -Never use the same needle more than once -Eat 2-3 carbohydrate choices for each meal and 1 for each snack -A meal: carbohydrates, protein, vegetable -A snack: A Fruit OR Vegetable AND Protein  -Try to be more active -Always pay attention to your body keeping watchful of possible low blood sugar (below 70) or high blood sugar (above 200)  -Check your feet every day looking for anything that was not there the day before      Expected Outcomes:  Demonstrated interest in learning. Expect positive outcomes  Education material provided: Living Well with Diabetes, My Plate, Snack sheet and Support group flyer  If problems or questions, patient to contact team via:  Phone  Future DSME appointment: PRN

## 2016-05-16 ENCOUNTER — Ambulatory Visit: Payer: Managed Care, Other (non HMO)

## 2016-05-23 ENCOUNTER — Ambulatory Visit: Payer: Managed Care, Other (non HMO)

## 2016-06-02 ENCOUNTER — Other Ambulatory Visit: Payer: Self-pay | Admitting: Physician Assistant

## 2016-06-02 DIAGNOSIS — I10 Essential (primary) hypertension: Secondary | ICD-10-CM

## 2016-06-02 NOTE — Telephone Encounter (Signed)
Pt is calling about a refill on her HCTZ and Atenolol please call into her [facility]

## 2016-07-11 ENCOUNTER — Encounter: Payer: Self-pay | Admitting: Family Medicine

## 2016-08-24 ENCOUNTER — Encounter: Payer: Self-pay | Admitting: Family Medicine

## 2016-08-24 MED ORDER — BASAGLAR KWIKPEN 100 UNIT/ML ~~LOC~~ SOPN: 10.0000 [IU] | PEN_INJECTOR | Freq: Every day | SUBCUTANEOUS | 3 refills | Status: DC

## 2016-08-31 ENCOUNTER — Ambulatory Visit (INDEPENDENT_AMBULATORY_CARE_PROVIDER_SITE_OTHER): Payer: Managed Care, Other (non HMO) | Admitting: Family Medicine

## 2016-08-31 VITALS — BP 152/92 | HR 83 | Temp 98.2°F | Ht 66.0 in | Wt 193.4 lb

## 2016-08-31 DIAGNOSIS — E1169 Type 2 diabetes mellitus with other specified complication: Secondary | ICD-10-CM | POA: Diagnosis not present

## 2016-08-31 DIAGNOSIS — R5383 Other fatigue: Secondary | ICD-10-CM

## 2016-08-31 DIAGNOSIS — Z1231 Encounter for screening mammogram for malignant neoplasm of breast: Secondary | ICD-10-CM | POA: Diagnosis not present

## 2016-08-31 DIAGNOSIS — Z1389 Encounter for screening for other disorder: Secondary | ICD-10-CM

## 2016-08-31 DIAGNOSIS — E559 Vitamin D deficiency, unspecified: Secondary | ICD-10-CM

## 2016-08-31 DIAGNOSIS — E669 Obesity, unspecified: Secondary | ICD-10-CM | POA: Diagnosis not present

## 2016-08-31 DIAGNOSIS — I10 Essential (primary) hypertension: Secondary | ICD-10-CM

## 2016-08-31 DIAGNOSIS — Z1239 Encounter for other screening for malignant neoplasm of breast: Secondary | ICD-10-CM

## 2016-08-31 LAB — COMPREHENSIVE METABOLIC PANEL
ALBUMIN: 4.3 g/dL (ref 3.5–5.2)
ALK PHOS: 55 U/L (ref 39–117)
ALT: 13 U/L (ref 0–35)
AST: 15 U/L (ref 0–37)
BILIRUBIN TOTAL: 0.3 mg/dL (ref 0.2–1.2)
BUN: 13 mg/dL (ref 6–23)
CALCIUM: 10 mg/dL (ref 8.4–10.5)
CO2: 33 meq/L — AB (ref 19–32)
CREATININE: 0.7 mg/dL (ref 0.40–1.20)
Chloride: 102 mEq/L (ref 96–112)
GFR: 112.05 mL/min (ref >60.00)
Glucose, Bld: 99 mg/dL (ref 70–99)
Potassium: 3.4 mEq/L — ABNORMAL LOW (ref 3.5–5.1)
Sodium: 141 mEq/L (ref 135–145)
TOTAL PROTEIN: 7.5 g/dL (ref 6.0–8.3)

## 2016-08-31 LAB — MICROALBUMIN / CREATININE URINE RATIO
CREATININE, U: 144.6 mg/dL
Microalb Creat Ratio: 5.8 mg/g (ref 0.0–30.0)
Microalb, Ur: 8.4 mg/dL — ABNORMAL HIGH (ref 0.0–1.9)

## 2016-08-31 LAB — HEMOGLOBIN A1C: HEMOGLOBIN A1C: 6.3 % (ref 4.6–6.5)

## 2016-08-31 LAB — VITAMIN D 25 HYDROXY (VIT D DEFICIENCY, FRACTURES): VITD: 12.98 ng/mL — ABNORMAL LOW (ref 30.00–100.00)

## 2016-08-31 LAB — TSH: TSH: 0.8 u[IU]/mL (ref 0.35–4.50)

## 2016-08-31 MED ORDER — LISINOPRIL-HYDROCHLOROTHIAZIDE 20-25 MG PO TABS: 1.0000 | ORAL_TABLET | Freq: Every day | ORAL | 3 refills | Status: DC

## 2016-08-31 MED ORDER — ATENOLOL 100 MG PO TABS: 100.0000 mg | ORAL_TABLET | Freq: Every day | ORAL | 3 refills | Status: DC

## 2016-08-31 NOTE — Patient Instructions (Signed)
We will set you up for a mammogram Please call Dr. Delanna Ahmadi office Phone: [phone] and ask them to check on your pap- ?are you due  We will check labs for you today and I will be in touch with your asap For your  BP 1. Change atenolol to 100 mg, 1 pill daily 2. Change hctz 25 to lisinopril20/hctz 25.  Start with 1/2 tablet.  If BP higher than 140/90 increase to a whole tablet  Take care!

## 2016-08-31 NOTE — Progress Notes (Addendum)
[facility] [address], [place], [place] [address] [phone]  Date:  08/31/2016   Name:  Autumn Cook   DOB:  03/22/1961   MRN:  [id number]  PCP:  Lamar Blinks, MD    Chief Complaint: Follow-up (Pt states that bp has been elevated, highest has been 130 's over 100's. )   History of Present Illness:  Autumn Cook is a 56 y.o. very pleasant female patient who presents with the following:  History of DM, high cholesterol, HTN Here today to follow-up on her DM I have not seen her since December- she did not come in for an A1c as requested.   Needs urine microalbumin, pneumonia vaccine  She notes that she has been feeling tired, and her BP has "been up."  Her glucose has been under 150 per her report Over the last week or so she has felt more fatigued.  Her BP was approx 133/102 when she checked it at home.  She is concerned that her BP is too high.  No cough, fever or ST.  No urinary sx She is post- menopausal She is using insulin at 14 units daily, and her metformin at 500 mg a day  She is on atenolol 100 mg and also hctz 25  for her BP   She has noted some memory concerns just since her BP has been high.  Has not seen neurology  She is overdue for her mammogram- would like to have this done here at the [facility].  We will order for her   BP Readings from Last 3 Encounters:  08/31/16 (!) 152/92  04/24/16 138/88  03/23/16 122/82   Lab Results  Component Value Date   HGBA1C 11.5 (H) 03/07/2016   Lab Results  Component Value Date   TSH 0.888 11/24/2013    Pulse Readings from Last 3 Encounters:  08/31/16 83  04/24/16 87  03/23/16 72    Patient Active Problem List   Diagnosis Date Noted  . Uncontrolled diabetes mellitus type 2 without complications ([facility]) 37/02/6268  . Achilles tendonitis, bilateral 06/11/2015  . Chest pain with moderate risk for cardiac etiology 10/13/2014  .  Hypercholesterolemia 10/13/2014  . Idiopathic scoliosis 08/18/2014  . Hypertension 09/11/2012  . Overweight (BMI 25.0-29.9) 09/11/2012  . Migraine, unspecified, without mention of intractable migraine without mention of status migrainosus 09/11/2012  . Hx of thyroid nodule 09/11/2012  . History of tobacco use 09/11/2012    Past Medical History:  Diagnosis Date  . Allergy   . Arthritis   . Diabetes mellitus without complication ([facility])   . Hypertension   . Migraine     No past surgical history on file.  Social History  Substance Use Topics  . Smoking status: Former Research scientist (life sciences)  . Smokeless tobacco: Never Used  . Alcohol use No    Family History  Problem Relation Age of Onset  . Hypertension Father   . Cancer Father 67    lung cancer  . Stroke Father   . Heart disease Mother     arrhythmia  . Hypertension Mother   . Kidney failure Sister     Allergies  Allergen Reactions  . Asa [Aspirin] Other (See Comments)    GI upset   . Penicillins Hives    Medication list has been reviewed and updated.  Current Outpatient Prescriptions on File Prior to Visit  Medication Sig Dispense Refill  . albuterol (PROVENTIL HFA;VENTOLIN HFA)  108 (90 Base) MCG/ACT inhaler Inhale 2 puffs into the lungs every 4 (four) hours as needed for wheezing or shortness of breath (cough, shortness of breath or wheezing.). 1 Inhaler 1  . atenolol (TENORMIN) 50 MG tablet Take 1 tablet (50 mg total) by mouth daily. 90 tablet 3  . blood glucose meter kit and supplies KIT Dispense based on patient and insurance preference. Use up to four times daily as directed. (FOR ICD-9 250.00, 250.01). 1 each 0  . hydrochlorothiazide (HYDRODIURIL) 25 MG tablet Take 1 tablet (25 mg total) by mouth daily. 90 tablet 3  . Insulin Glargine (BASAGLAR KWIKPEN) 100 UNIT/ML SOPN Inject 0.1 mLs (10 Units total) into the skin at bedtime. Adjust as directed- sample given today 15 mL 3  . Insulin Pen Needle (PEN NEEDLES) 32G X 4 MM MISC  1 each by Does not apply route daily. 100 each 3  . metFORMIN (GLUCOPHAGE) 500 MG tablet Take 1 tablet (500 mg total) by mouth daily with supper. 90 tablet 3  . ONE TOUCH ULTRA TEST test strip Check sugar 2 times daily 100 each prn  . ONETOUCH DELICA LANCETS 16X MISC Check sugar 2 times daily 100 each prn   No current facility-administered medications on file prior to visit.     Review of Systems:  As per HPI- otherwise negative.   Physical Examination: Vitals:   08/31/16 1118  BP: (!) 152/92  Pulse: 83  Temp: 98.2 F (36.8 C)   Vitals:   08/31/16 1118  Weight: 193 lb 6.4 oz (87.7 kg)  Height: 5' 6"  (1.676 m)   Body mass index is 31.22 kg/m. Ideal Body Weight: Weight in (lb) to have BMI = 25: 154.6  GEN: WDWN, NAD, Non-toxic, A & O x 3, overweight, otherwise looks well HEENT: Atraumatic, Normocephalic. Neck supple. No masses, No LAD. Ears and Nose: No external deformity. CV: RRR, No M/G/R. No JVD. No thrill. No extra heart sounds. PULM: CTA B, no wheezes, crackles, rhonchi. No retractions. No resp. distress. No accessory muscle use. ABD: S, NT, ND, +BS. No rebound. No HSM. EXTR: No c/c/e NEURO Normal gait.  PSYCH: Normally interactive. Conversant. Not depressed or anxious appearing.  Calm demeanor.    Assessment and Plan: Diabetes mellitus type 2 in obese ([place] [facility]) - Plan: Hemoglobin A1c, Urine Microalbumin w/creat. ratio, Comprehensive metabolic panel  Essential hypertension - Plan: atenolol (TENORMIN) 100 MG tablet, lisinopril-hydrochlorothiazide (PRINZIDE,ZESTORETIC) 20-25 MG tablet  Fatigue, unspecified type - Plan: TSH, Vitamin D, 25-hydroxy  Screening for hematuria or proteinuria - Plan: Urine Microalbumin w/creat. ratio  Screening for breast cancer - Plan: MM SCREENING BREAST TOMO BILATERAL  Here today for follow-up and to discuss concern about her BP Will change her from hctz 25 to lisinopril 20/hctz 25; start with 1/2 tablet  She will let me know how her BP  looks with this med, continue atenolol Check TSH and vitamin D, urine protein Schedule mammo  Will plan further follow- up pending labs.   Signed Lamar Blinks, MD  Received her labs 4/27-  Results for orders placed or performed in visit on 08/31/16  Hemoglobin A1c  Result Value Ref Range   Hgb A1c MFr Bld 6.3 4.6 - 6.5 %  Urine Microalbumin w/creat. ratio  Result Value Ref Range   Microalb, Ur 8.4 (H) 0.0 - 1.9 mg/dL   Creatinine,U 144.6 mg/dL   Microalb Creat Ratio 5.8 0.0 - 30.0 mg/g  Comprehensive metabolic panel  Result Value Ref Range   Sodium 141 135 -  145 mEq/L   Potassium 3.4 (L) 3.5 - 5.1 mEq/L   Chloride 102 96 - 112 mEq/L   CO2 33 (H) 19 - 32 mEq/L   Glucose, Bld 99 70 - 99 mg/dL   BUN 13 6 - 23 mg/dL   Creatinine, Ser 0.70 0.40 - 1.20 mg/dL   Total Bilirubin 0.3 0.2 - 1.2 mg/dL   Alkaline Phosphatase 55 39 - 117 U/L   AST 15 0 - 37 U/L   ALT 13 0 - 35 U/L   Total Protein 7.5 6.0 - 8.3 g/dL   Albumin 4.3 3.5 - 5.2 g/dL   Calcium 10.0 8.4 - 10.5 mg/dL   GFR 112.05 >60.00 mL/min  TSH  Result Value Ref Range   TSH 0.80 0.35 - 4.50 uIU/mL  Vitamin D, 25-hydroxy  Result Value Ref Range   VITD 12.98 (L) 30.00 - 100.00 ng/mL

## 2016-09-01 ENCOUNTER — Encounter: Payer: Self-pay | Admitting: Family Medicine

## 2016-09-01 MED ORDER — VITAMIN D (ERGOCALCIFEROL) 1.25 MG (50000 UNIT) PO CAPS
50000.0000 [IU] | ORAL_CAPSULE | ORAL | 0 refills | Status: DC
Start: 2016-09-01 — End: 2017-04-26

## 2016-09-01 NOTE — Addendum Note (Signed)
Addended by: Lamar Blinks C on: 09/01/2016 06:32 AM   Modules accepted: Orders

## 2016-09-24 ENCOUNTER — Encounter: Payer: Self-pay | Admitting: Family Medicine

## 2016-09-26 MED ORDER — LOSARTAN POTASSIUM 50 MG PO TABS: 50.0000 mg | ORAL_TABLET | Freq: Every day | ORAL | 5 refills | Status: DC

## 2016-09-26 NOTE — Addendum Note (Signed)
Addended by: Lamar Blinks C on: 09/26/2016 05:49 PM   Modules accepted: Orders

## 2016-09-30 ENCOUNTER — Other Ambulatory Visit: Payer: Self-pay | Admitting: Family Medicine

## 2016-09-30 DIAGNOSIS — I10 Essential (primary) hypertension: Secondary | ICD-10-CM

## 2017-01-16 ENCOUNTER — Ambulatory Visit (HOSPITAL_BASED_OUTPATIENT_CLINIC_OR_DEPARTMENT_OTHER)
Admission: RE | Admit: 2017-01-16 | Discharge: 2017-01-16 | Disposition: A | Discharge status: Home / Self Care | Payer: Managed Care, Other (non HMO) | Source: Ambulatory Visit | Attending: Family Medicine | Admitting: Family Medicine

## 2017-01-16 ENCOUNTER — Encounter (HOSPITAL_BASED_OUTPATIENT_CLINIC_OR_DEPARTMENT_OTHER): Payer: Self-pay

## 2017-01-16 DIAGNOSIS — Z1231 Encounter for screening mammogram for malignant neoplasm of breast: Secondary | ICD-10-CM | POA: Diagnosis not present

## 2017-01-16 DIAGNOSIS — Z1239 Encounter for other screening for malignant neoplasm of breast: Secondary | ICD-10-CM

## 2017-04-26 ENCOUNTER — Encounter: Payer: Self-pay | Admitting: Family Medicine

## 2017-04-26 ENCOUNTER — Ambulatory Visit (INDEPENDENT_AMBULATORY_CARE_PROVIDER_SITE_OTHER): Payer: Managed Care, Other (non HMO) | Admitting: Family Medicine

## 2017-04-26 VITALS — BP 138/79 | HR 84 | Temp 98.7°F | Resp 16 | Ht 66.0 in | Wt 200.2 lb

## 2017-04-26 DIAGNOSIS — E1169 Type 2 diabetes mellitus with other specified complication: Secondary | ICD-10-CM

## 2017-04-26 DIAGNOSIS — E876 Hypokalemia: Secondary | ICD-10-CM

## 2017-04-26 DIAGNOSIS — R053 Chronic cough: Secondary | ICD-10-CM

## 2017-04-26 DIAGNOSIS — E559 Vitamin D deficiency, unspecified: Secondary | ICD-10-CM | POA: Diagnosis not present

## 2017-04-26 DIAGNOSIS — R0602 Shortness of breath: Secondary | ICD-10-CM | POA: Diagnosis not present

## 2017-04-26 DIAGNOSIS — E669 Obesity, unspecified: Secondary | ICD-10-CM

## 2017-04-26 DIAGNOSIS — Z13220 Encounter for screening for lipoid disorders: Secondary | ICD-10-CM

## 2017-04-26 DIAGNOSIS — R05 Cough: Secondary | ICD-10-CM

## 2017-04-26 DIAGNOSIS — I10 Essential (primary) hypertension: Secondary | ICD-10-CM

## 2017-04-26 MED ORDER — HYDROCHLOROTHIAZIDE 25 MG PO TABS: 25.0000 mg | ORAL_TABLET | Freq: Every day | ORAL | 3 refills | Status: DC

## 2017-04-26 MED ORDER — VITAMIN D (ERGOCALCIFEROL) 1.25 MG (50000 UNIT) PO CAPS: 50000.0000 [IU] | ORAL_CAPSULE | ORAL | 0 refills | Status: DC

## 2017-04-26 MED ORDER — LOSARTAN POTASSIUM 50 MG PO TABS: 50.0000 mg | ORAL_TABLET | Freq: Every day | ORAL | 3 refills | Status: DC

## 2017-04-26 MED ORDER — PEN NEEDLES 32G X 4 MM MISC: 1.0000 | Freq: Every day | 3 refills | Status: DC

## 2017-04-26 MED ORDER — BASAGLAR KWIKPEN 100 UNIT/ML ~~LOC~~ SOPN: PEN_INJECTOR | SUBCUTANEOUS | 3 refills | Status: DC

## 2017-04-26 MED ORDER — ATENOLOL 100 MG PO TABS: 100.0000 mg | ORAL_TABLET | Freq: Every day | ORAL | 3 refills | Status: DC

## 2017-04-26 NOTE — Progress Notes (Addendum)
[place] at [facility] [address] [place], [place] [address] [phone]  Date:  04/26/2017   Name:  Autumn Cook   DOB:  30-Apr-1961   MRN:  [id number]  PCP:  Darreld Mclean, MD    Chief Complaint: No chief complaint on file.   History of Present Illness:  Autumn Cook is a 56 y.o. very pleasant female patient who presents with the following:  I last saw her in April of this year:  History of DM, high cholesterol, HTN Here today to follow-up on her DM I have not seen her since December- she did not come in for an A1c as requested.   Needs urine microalbumin, pneumonia vaccine She notes that she has been feeling tired, and her BP has "been up."  Her glucose has been under 150 per her report Over the last week or so she has felt more fatigued.  Her BP was approx 133/102 when she checked it at home.  She is concerned that her BP is too high.  No cough, fever or ST.  No urinary sx She is post- menopausal She is using insulin at 14 units daily, and her metformin at 500 mg a day  She is on atenolol 100 mg and also hctz 25  for her BP   We have communicated about her labs and medications since our last visit, but she did not come in for a recheck as requested  Your A1c looks great- very good work! Continue your current insulin and metformin regimen. Your A1c shows that your blood sugar is normal most of the time. If you have any more episodes of low blood sugar I would recommend that we DECREASE your insulin by 2-4 units a day. Please keep me posted  Metabolic profile is fine except your potassium is minimally high. Please look for a list of high potassium foods online and make sure you eat 1-2 of these foods daily.   Thyroid is normal  Your vitamin D is low- this may be why you are feeling so tired. I am going to rx a vitamin D supplement for you to take once a WEEK for 12 weeks. Following this course, I would  recommend that you use an OTC vitamin D supplement with 1,000 IU of vitamin D daily  Finally, you did have a small amount of protein in your urine. This can be a sign of early kidney damage due to diabetes. The first step is to add a medication such as lisinopril to help protect your kidneys. We already did this so it works out well! We will monitor this and hopefully it will resolve.   Please see me in 3-4 month so check on your progress and keep me posted about your blood pressures   I sent in an rx for Losartan 50 mg. Please take this once a day, and continue your HCTZ 25 and atenolol. I did rx atenolol 100 mg at our last visit so I think this is what you will get when you refill. Since we are adding a new medication why don't you start with taking just 1/2 of the losartan pill and see how your BP responds. If you find your BP running too low with this regimen please let me know.  Let's plan to meet back in one month to see how you are doing  BP goal 120- 130/ 75- 85   She notes that her blood  sugar does not go over 138 or so Lab Results  Component Value Date   HGBA1C 6.3 08/31/2016   BP Readings from Last 3 Encounters:  04/26/17 138/79  08/31/16 (!) 152/92  04/24/16 138/88   She is toleratetinng her BP meds well She is taking 14 units of basaglar daily- she will not take it on occasion when her glucose is quite low   She is due for a cholesterol- she did eat spaghetti with butter so far today She is feeling well on her current BP regimen and her BP has been under ok control She has gained some weight which is frustrating to her.   Wt Readings from Last 3 Encounters:  04/26/17 200 lb 3.2 oz (90.8 kg)  08/31/16 193 lb 6.4 oz (87.7 kg)  04/24/16 185 lb (83.9 kg)   Would like to recheck her vitamin D today   Patient Active Problem List   Diagnosis Date Noted  . Uncontrolled diabetes mellitus type 2 without complications ([place]) 97/58/8325  . Achilles tendonitis, bilateral  06/11/2015  . Chest pain with moderate risk for cardiac etiology 10/13/2014  . Hypercholesterolemia 10/13/2014  . Idiopathic scoliosis 08/18/2014  . Hypertension 09/11/2012  . Overweight (BMI 25.0-29.9) 09/11/2012  . Migraine, unspecified, without mention of intractable migraine without mention of status migrainosus 09/11/2012  . Hx of thyroid nodule 09/11/2012  . History of tobacco use 09/11/2012    Past Medical History:  Diagnosis Date  . Allergy   . Arthritis   . Diabetes mellitus without complication ([place])   . Hypertension   . Migraine     History reviewed. No pertinent surgical history.  Social History   Tobacco Use  . Smoking status: Former Research scientist (life sciences)  . Smokeless tobacco: Never Used  Substance Use Topics  . Alcohol use: No  . Drug use: No    Family History  Problem Relation Age of Onset  . Hypertension Father   . Cancer Father 57       lung cancer  . Stroke Father   . Heart disease Mother        arrhythmia  . Hypertension Mother   . Kidney failure Sister     Allergies  Allergen Reactions  . Asa [Aspirin] Other (See Comments)    GI upset   . Penicillins Hives    Medication list has been reviewed and updated.  Current Outpatient Medications on File Prior to Visit  Medication Sig Dispense Refill  . albuterol (PROVENTIL HFA;VENTOLIN HFA) 108 (90 Base) MCG/ACT inhaler Inhale 2 puffs into the lungs every 4 (four) hours as needed for wheezing or shortness of breath (cough, shortness of breath or wheezing.). 1 Inhaler 1  . atenolol (TENORMIN) 100 MG tablet Take 1 tablet (100 mg total) by mouth daily. 90 tablet 3  . blood glucose meter kit and supplies KIT Dispense based on patient and insurance preference. Use up to four times daily as directed. (FOR ICD-9 250.00, 250.01). 1 each 0  . hydrochlorothiazide (HYDRODIURIL) 25 MG tablet TAKE 1 TABLET BY MOUTH EVERY DAY 90 tablet 2  . Insulin Glargine (BASAGLAR KWIKPEN) 100 UNIT/ML SOPN Inject 0.1 mLs (10 Units total)  into the skin at bedtime. Adjust as directed- sample given today 15 mL 3  . Insulin Pen Needle (PEN NEEDLES) 32G X 4 MM MISC 1 each by Does not apply route daily. 100 each 3  . losartan (COZAAR) 50 MG tablet Take 1 tablet (50 mg total) by mouth daily. 30 tablet 5  . metFORMIN (  GLUCOPHAGE) 500 MG tablet Take 1 tablet (500 mg total) by mouth daily with supper. 90 tablet 3  . ONE TOUCH ULTRA TEST test strip Check sugar 2 times daily 100 each prn  . ONETOUCH DELICA LANCETS 45X MISC Check sugar 2 times daily 100 each prn  . Vitamin D, Ergocalciferol, (DRISDOL) 50000 units CAPS capsule Take 1 capsule (50,000 Units total) by mouth every 7 (seven) days. 12 capsule 0   No current facility-administered medications on file prior to visit.     Review of Systems:  As per HPI- otherwise negative. No hypoglycemia  Physical Examination: Vitals:   04/26/17 1355  BP: 138/79  Pulse: 84  Resp: 16  Temp: 98.7 F (37.1 C)  SpO2: 100%   Vitals:   04/26/17 1355  Weight: 200 lb 3.2 oz (90.8 kg)  Height: _0  (1.676 m)   Body mass index is 32.31 kg/m. Ideal Body Weight: Weight in (lb) to have BMI = 25: 154.6  GEN: WDWN, NAD, Non-toxic, A & O x 3, obese, looks well HEENT: Atraumatic, Normocephalic. Neck supple. No masses, No LAD.  Bilateral TM wnl, oropharynx normal.  PEERL,EOMI.   Ears and Nose: No external deformity. CV: RRR, No M/G/R. No JVD. No thrill. No extra heart sounds. PULM: CTA B, no wheezes, crackles, rhonchi. No retractions. No resp. distress. No accessory muscle use. ABD: S, NT, ND, +BS. No rebound. No HSM. EXTR: No c/c/e NEURO Normal gait.  PSYCH: Normally interactive. Conversant. Not depressed or anxious appearing.  Calm demeanor.    Assessment and Plan: Diabetes mellitus type 2 in obese ([place]) - Plan: Insulin Glargine (BASAGLAR KWIKPEN) 100 UNIT/ML SOPN, Hemoglobin M4W, Basic metabolic panel, Insulin Pen Needle (PEN NEEDLES) 32G X 4 MM MISC  Chronic cough  SOB (shortness of  breath)  Essential hypertension - Plan: atenolol (TENORMIN) 100 MG tablet, hydrochlorothiazide (HYDRODIURIL) 25 MG tablet, losartan (COZAAR) 50 MG tablet  Vitamin D deficiency - Plan: Vitamin D, Ergocalciferol, (DRISDOL) 50000 units CAPS capsule, Vitamin D (25 hydroxy)  Screening for hyperlipidemia - Plan: Lipid panel  Follow-up visit today We will check her labs as above Refilled medications for her as above  Signed Lamar Blinks, MD  Received her labs 12/22 Message to pt Your A1c shows good control of your diabetes.  Continue your current regimen Your vitamin D is low- please take the high dose vitamin D rx weekly for 12 weeks. Then change to a DAILY otc vitamin D with 1,000 IU of vitamin D daily  Your potassium is minimally low- please try to increase your dietary potassium intake (you can find a good list of high potassium foods online), and let's repeat your potassium as a lab visit only in one month.  I will order this lab for you.  Your cholesterol is not bad, but not as good as I would like to see it for you.  I would suggest that we start a cholesterol medication.  Would this be ok with you?  Please let me know!  Otherwise we can plan to visit in about 6 months Results for orders placed or performed in visit on 04/26/17  Hemoglobin A1c  Result Value Ref Range   Hgb A1c MFr Bld 6.4 4.6 - 6.5 %  Lipid panel  Result Value Ref Range   Cholesterol 226 (H) 0 - 200 mg/dL   Triglycerides 180.0 (H) 0.0 - 149.0 mg/dL   HDL 52.40 >39.00 mg/dL   VLDL 36.0 0.0 - 40.0 mg/dL   LDL Cholesterol 138 (  H) 0 - 99 mg/dL   Total CHOL/HDL Ratio 4    NonHDL 748.27   Basic metabolic panel  Result Value Ref Range   Sodium 140 135 - 145 mEq/L   Potassium 3.3 (L) 3.5 - 5.1 mEq/L   Chloride 103 96 - 112 mEq/L   CO2 26 19 - 32 mEq/L   Glucose, Bld 131 (H) 70 - 99 mg/dL   BUN 17 6 - 23 mg/dL   Creatinine, Ser 0.80 0.40 - 1.20 mg/dL   Calcium 9.7 8.4 - 10.5 mg/dL   GFR 95.82 >60.00 mL/min   Vitamin D (25 hydroxy)  Result Value Ref Range   VITD 18.31 (L) 30.00 - 100.00 ng/mL

## 2017-04-26 NOTE — Patient Instructions (Signed)
Good to see you today!  Take care and I will be in touch with your labs Your BP looks good today We may be able to decrease your insulin dose a bit depending on your A1c Continue to work on your weight Please plan to see me in about 6 months unless your labs suggest otherwise

## 2017-04-27 LAB — LIPID PANEL
CHOL/HDL RATIO: 4
CHOLESTEROL: 226 mg/dL — AB (ref 0–200)
HDL: 52.4 mg/dL (ref >39.00)
LDL CALC: 138 mg/dL — AB (ref 0–99)
NonHDL: 173.82
Triglycerides: 180 mg/dL — ABNORMAL HIGH (ref 0.0–149.0)
VLDL: 36 mg/dL (ref 0.0–40.0)

## 2017-04-27 LAB — BASIC METABOLIC PANEL
BUN: 17 mg/dL (ref 6–23)
CHLORIDE: 103 meq/L (ref 96–112)
CO2: 26 mEq/L (ref 19–32)
Calcium: 9.7 mg/dL (ref 8.4–10.5)
Creatinine, Ser: 0.8 mg/dL (ref 0.40–1.20)
GFR: 95.82 mL/min (ref >60.00)
Glucose, Bld: 131 mg/dL — ABNORMAL HIGH (ref 70–99)
POTASSIUM: 3.3 meq/L — AB (ref 3.5–5.1)
SODIUM: 140 meq/L (ref 135–145)

## 2017-04-27 LAB — VITAMIN D 25 HYDROXY (VIT D DEFICIENCY, FRACTURES): VITD: 18.31 ng/mL — AB (ref 30.00–100.00)

## 2017-04-27 LAB — HEMOGLOBIN A1C: Hgb A1c MFr Bld: 6.4 % (ref 4.6–6.5)

## 2017-04-28 ENCOUNTER — Encounter: Payer: Self-pay | Admitting: Family Medicine

## 2017-04-28 NOTE — Addendum Note (Signed)
Addended by: Lamar Blinks C on: 04/28/2017 08:09 AM   Modules accepted: Orders

## 2017-05-03 ENCOUNTER — Other Ambulatory Visit: Payer: Self-pay | Admitting: Family Medicine

## 2017-05-03 DIAGNOSIS — I10 Essential (primary) hypertension: Secondary | ICD-10-CM

## 2017-05-04 MED ORDER — GLUCOSE BLOOD VI STRP: ORAL_STRIP | 12 refills | Status: DC

## 2017-05-04 MED ORDER — LISINOPRIL-HYDROCHLOROTHIAZIDE 20-25 MG PO TABS: 1.0000 | ORAL_TABLET | Freq: Every day | ORAL | 3 refills | Status: DC

## 2017-05-04 NOTE — Telephone Encounter (Signed)
Received message from pt  This is all good news. I am working to get my A1C even lower. I went to the pharmacy and I brought the bottles of the med I have been taken. I think I gave you the wrong information at the visit. My Meds are  Lisinopril 20-25 mg   Metformin 500 Mg  Atenolol 100 mg.  The pharmacy will call you about it.     I went to try to have them refilled and declined the water pill and Losartan.   Also I received notice that starting May 08, 2017 the blood glucose test strips will no longer be covered. The letter said I need to change to the new Accu-Check meter and test strips. The letter says I will need a new prescription for Accu-Chek test strips to fill after May 08, 2017. I have some test strips for my tester I'm using now, so I'm Ok. I remember you mentioned it during our visit but i was not aware of it at that time.   By the way Thanks for all you have done to make this moment that I'm feeling better possible.    Updated her med list to reflect her lisinopril/hctz and also the accu-chek strips

## 2017-06-26 ENCOUNTER — Other Ambulatory Visit: Payer: Self-pay | Admitting: Family Medicine

## 2017-06-26 DIAGNOSIS — E1165 Type 2 diabetes mellitus with hyperglycemia: Principal | ICD-10-CM

## 2017-06-26 DIAGNOSIS — IMO0001 Reserved for inherently not codable concepts without codable children: Secondary | ICD-10-CM

## 2017-07-24 ENCOUNTER — Other Ambulatory Visit: Payer: Self-pay | Admitting: Family Medicine

## 2017-07-24 DIAGNOSIS — E559 Vitamin D deficiency, unspecified: Secondary | ICD-10-CM

## 2017-08-29 ENCOUNTER — Other Ambulatory Visit: Payer: Self-pay | Admitting: Family Medicine

## 2017-08-29 DIAGNOSIS — E1169 Type 2 diabetes mellitus with other specified complication: Secondary | ICD-10-CM

## 2017-08-29 DIAGNOSIS — E669 Obesity, unspecified: Principal | ICD-10-CM

## 2017-10-09 ENCOUNTER — Encounter: Payer: Self-pay | Admitting: Family Medicine

## 2017-10-11 ENCOUNTER — Ambulatory Visit (INDEPENDENT_AMBULATORY_CARE_PROVIDER_SITE_OTHER): Payer: Managed Care, Other (non HMO) | Admitting: Family Medicine

## 2017-10-11 ENCOUNTER — Encounter: Payer: Self-pay | Admitting: Family Medicine

## 2017-10-11 VITALS — BP 150/92 | HR 68 | Resp 16 | Ht 66.0 in | Wt 196.0 lb

## 2017-10-11 DIAGNOSIS — M654 Radial styloid tenosynovitis [de Quervain]: Secondary | ICD-10-CM

## 2017-10-11 DIAGNOSIS — E876 Hypokalemia: Secondary | ICD-10-CM

## 2017-10-11 DIAGNOSIS — E669 Obesity, unspecified: Secondary | ICD-10-CM | POA: Diagnosis not present

## 2017-10-11 DIAGNOSIS — E1169 Type 2 diabetes mellitus with other specified complication: Secondary | ICD-10-CM

## 2017-10-11 DIAGNOSIS — E559 Vitamin D deficiency, unspecified: Secondary | ICD-10-CM | POA: Diagnosis not present

## 2017-10-11 DIAGNOSIS — I10 Essential (primary) hypertension: Secondary | ICD-10-CM

## 2017-10-11 LAB — COMPREHENSIVE METABOLIC PANEL
ALK PHOS: 46 U/L (ref 39–117)
ALT: 11 U/L (ref 0–35)
AST: 13 U/L (ref 0–37)
Albumin: 4 g/dL (ref 3.5–5.2)
BUN: 14 mg/dL (ref 6–23)
CO2: 29 meq/L (ref 19–32)
Calcium: 9.9 mg/dL (ref 8.4–10.5)
Chloride: 102 mEq/L (ref 96–112)
Creatinine, Ser: 0.74 mg/dL (ref 0.40–1.20)
GFR: 104.66 mL/min (ref >60.00)
GLUCOSE: 124 mg/dL — AB (ref 70–99)
POTASSIUM: 3.4 meq/L — AB (ref 3.5–5.1)
Sodium: 139 mEq/L (ref 135–145)
TOTAL PROTEIN: 7.2 g/dL (ref 6.0–8.3)
Total Bilirubin: 0.4 mg/dL (ref 0.2–1.2)

## 2017-10-11 LAB — CBC
HEMATOCRIT: 38.6 % (ref 36.0–46.0)
Hemoglobin: 12.6 g/dL (ref 12.0–15.0)
MCHC: 32.6 g/dL (ref 30.0–36.0)
MCV: 88.1 fl (ref 78.0–100.0)
Platelets: 345 10*3/uL (ref 150.0–400.0)
RBC: 4.38 Mil/uL (ref 3.87–5.11)
RDW: 14.5 % (ref 11.5–15.5)
WBC: 6.3 10*3/uL (ref 4.0–10.5)

## 2017-10-11 LAB — VITAMIN D 25 HYDROXY (VIT D DEFICIENCY, FRACTURES): VITD: 19.17 ng/mL — ABNORMAL LOW (ref 30.00–100.00)

## 2017-10-11 LAB — HEMOGLOBIN A1C: Hgb A1c MFr Bld: 6.4 % (ref 4.6–6.5)

## 2017-10-11 LAB — GLUCOSE, POCT (MANUAL RESULT ENTRY): POC GLUCOSE: 136 mg/dL — AB (ref 70–99)

## 2017-10-11 MED ORDER — VITAMIN D (ERGOCALCIFEROL) 1.25 MG (50000 UNIT) PO CAPS: 50000.0000 [IU] | ORAL_CAPSULE | ORAL | 0 refills | Status: DC

## 2017-10-11 MED ORDER — LISINOPRIL-HYDROCHLOROTHIAZIDE 20-12.5 MG PO TABS: 2.0000 | ORAL_TABLET | Freq: Every day | ORAL | 3 refills | Status: DC

## 2017-10-11 NOTE — Patient Instructions (Addendum)
Good to see you today!  Please do get your diabetic eye exam soon Let me know what type of meter/ strips you need and I can rx for you We are going to increase your lisinopril/ hctz to 40/25.  They do not make a 40/25 pill, so we will use 2 of the 20/12.5 per day  Use the wrist splint as needed for comfort- you can take it off when you need to.  Please let me know if your wrist is not better within the next couple of weeks  I'll be in touch with your labs asap

## 2017-10-11 NOTE — Progress Notes (Signed)
[place] at [facility] [address] [place], [place] [address] [phone]  Date:  10/11/2017   Name:  Autumn Cook   DOB:  [birth year]   MRN:  [id number]  PCP:  Darreld Mclean, MD    Chief Complaint: Glucose checked and Wrist Pain (right wrist, no known injury, one month)   History of Present Illness:  Autumn Cook is a 57 y.o. very pleasant female patient who presents with the following:  History of DM, overweight, HTN, hyperlipidemia Last seen here by myself in January:  Your A1c shows good control of your diabetes.  Continue your current regimen Your vitamin D is low- please take the high dose vitamin D rx weekly for 12 weeks. Then change to a DAILY otc vitamin D with 1,000 IU of vitamin D daily Your potassium is minimally low- please try to increase your dietary potassium intake (you can find a good list of high potassium foods online), and let's repeat your potassium as a lab visit only in one month.  I will order this lab for you. Your cholesterol is not bad, but not as good as I would like to see it for you.  I would suggest that we start a cholesterol medication.  Would this be ok with you?  Please let me know!  Otherwise we can plan to visit in about 6 months   Lab Results  Component Value Date   HGBA1C 6.4 04/26/2017   Atenolol 100, lisinopril/ hctz Metformin 500 daily, basaglar- 14 units per day She notes that her home glucose has been a little bit higher recently and she is worried about what her A1c will show She has noted more diarrhea again for about 3 weeks as well, ?due to metformin   BP Readings from Last 3 Encounters:  10/11/17 (!) 150/90  04/26/17 138/79  08/31/16 (!) 152/92   At home her BP may run a bit high at home- she does not recall her SBP numbers but DBP tends to be in the 90s  Right wrist pain at home for about one month- any use of the wrist bothers her  She cannot think of any cause  of the pain in particular  No change in her routine.   No discrete injury known  Patient Active Problem List   Diagnosis Date Noted  . Uncontrolled diabetes mellitus type 2 without complications ([place]) 31/54/0086  . Achilles tendonitis, bilateral 06/11/2015  . Chest pain with moderate risk for cardiac etiology 10/13/2014  . Hypercholesterolemia 10/13/2014  . Idiopathic scoliosis 08/18/2014  . Hypertension 09/11/2012  . Overweight (BMI 25.0-29.9) 09/11/2012  . Migraine, unspecified, without mention of intractable migraine without mention of status migrainosus 09/11/2012  . Hx of thyroid nodule 09/11/2012  . History of tobacco use 09/11/2012    Past Medical History:  Diagnosis Date  . Allergy   . Arthritis   . Diabetes mellitus without complication ([place])   . Hypertension   . Migraine     History reviewed. No pertinent surgical history.  Social History   Tobacco Use  . Smoking status: Former Research scientist (life sciences)  . Smokeless tobacco: Never Used  Substance Use Topics  . Alcohol use: No  . Drug use: No    Family History  Problem Relation Age of Onset  . Hypertension Father   . Cancer Father 100       lung cancer  . Stroke Father   . Heart disease Mother  arrhythmia  . Hypertension Mother   . Kidney failure Sister     Allergies  Allergen Reactions  . Asa [Aspirin] Other (See Comments)    GI upset   . Penicillins Hives    Medication list has been reviewed and updated.  Current Outpatient Medications on File Prior to Visit  Medication Sig Dispense Refill  . albuterol (PROVENTIL HFA;VENTOLIN HFA) 108 (90 Base) MCG/ACT inhaler Inhale 2 puffs into the lungs every 4 (four) hours as needed for wheezing or shortness of breath (cough, shortness of breath or wheezing.). 1 Inhaler 1  . atenolol (TENORMIN) 100 MG tablet Take 1 tablet (100 mg total) by mouth daily. 90 tablet 3  . blood glucose meter kit and supplies KIT Dispense based on patient and insurance preference. Use up to  four times daily as directed. (FOR ICD-9 250.00, 250.01). 1 each 0  . glucose blood (ACCU-CHEK ACTIVE STRIPS) test strip Use as instructed- check blood sugar up to 2x a day 100 each 12  . Insulin Glargine (BASAGLAR KWIKPEN) 100 UNIT/ML SOPN INJECT 14 UNITS SUBCUTANEOUSLY DAILY 15 mL 3  . Insulin Pen Needle (PEN NEEDLES) 32G X 4 MM MISC 1 each by Does not apply route daily. 100 each 3  . Insulin Pen Needle (PEN NEEDLES) 32G X 4 MM MISC 1 each by Does not apply route daily. 100 each 3  . metFORMIN (GLUCOPHAGE) 500 MG tablet TAKE 1 TABLET (500 MG TOTAL) BY MOUTH DAILY WITH SUPPER. 90 tablet 3  . ONE TOUCH ULTRA TEST test strip Check sugar 2 times daily 100 each prn  . ONETOUCH DELICA LANCETS 56P MISC Check sugar 2 times daily 100 each prn  . Vitamin D, Ergocalciferol, (DRISDOL) 50000 units CAPS capsule Take 1 capsule (50,000 Units total) by mouth every 7 (seven) days. 12 capsule 0   No current facility-administered medications on file prior to visit.     Review of Systems:  As per HPI- otherwise negative.   Physical Examination: Vitals:   10/11/17 1033  BP: (!) 150/90  Pulse: 68  Resp: 16  SpO2: 95%   Vitals:   10/11/17 1033  Weight: 196 lb (88.9 kg)  Height: 5' 6"  (1.676 m)   Body mass index is 31.64 kg/m. Ideal Body Weight: Weight in (lb) to have BMI = 25: 154.6  GEN: WDWN, NAD, Non-toxic, A & O x 3, overweight, looks well  HEENT: Atraumatic, Normocephalic. Neck supple. No masses, No LAD.  Bilateral TM wnl, oropharynx normal.  PEERL,EOMI.   Ears and Nose: No external deformity. CV: RRR, No M/G/R. No JVD. No thrill. No extra heart sounds. PULM: CTA B, no wheezes, crackles, rhonchi. No retractions. No resp. distress. No accessory muscle use. EXTR: No c/c/e NEURO Normal gait.  PSYCH: Normally interactive. Conversant. Not depressed or anxious appearing.  Calm demeanor.  Right wrist: positive finkelstein test Otherwise wrist exam is normal- normal ROM and strength of wrist.   Normal grip strength of hand  Full ROM No heat or redness   Assessment and Plan: Essential hypertension - Plan: CBC, lisinopril-hydrochlorothiazide (ZESTORETIC) 20-12.5 MG tablet  Vitamin D deficiency - Plan: Vitamin D (25 hydroxy)  Diabetes mellitus type 2 in obese ([place]) - Plan: Comprehensive metabolic panel, Hemoglobin A1c, POCT Glucose (CBG)  Hypokalemia - Plan: Comprehensive metabolic panel  De Quervain's tenosynovitis, right  HTN- will increase lisinopril to 40 mg, continue hctz 25, continue atenolol Await labs and will follow-up with her Wrist splint for de quervains to use as needed - she  will let me know if not better in the next couple of weeks   Signed Lamar Blinks, MD  Received her labs Results for orders placed or performed in visit on 10/11/17  Comprehensive metabolic panel  Result Value Ref Range   Sodium 139 135 - 145 mEq/L   Potassium 3.4 (L) 3.5 - 5.1 mEq/L   Chloride 102 96 - 112 mEq/L   CO2 29 19 - 32 mEq/L   Glucose, Bld 124 (H) 70 - 99 mg/dL   BUN 14 6 - 23 mg/dL   Creatinine, Ser 0.74 0.40 - 1.20 mg/dL   Total Bilirubin 0.4 0.2 - 1.2 mg/dL   Alkaline Phosphatase 46 39 - 117 U/L   AST 13 0 - 37 U/L   ALT 11 0 - 35 U/L   Total Protein 7.2 6.0 - 8.3 g/dL   Albumin 4.0 3.5 - 5.2 g/dL   Calcium 9.9 8.4 - 10.5 mg/dL   GFR 104.66 >60.00 mL/min  Hemoglobin A1c  Result Value Ref Range   Hgb A1c MFr Bld 6.4 4.6 - 6.5 %  Vitamin D (25 hydroxy)  Result Value Ref Range   VITD 19.17 (L) 30.00 - 100.00 ng/mL  CBC  Result Value Ref Range   WBC 6.3 4.0 - 10.5 K/uL   RBC 4.38 3.87 - 5.11 Mil/uL   Platelets 345.0 150.0 - 400.0 K/uL   Hemoglobin 12.6 12.0 - 15.0 g/dL   HCT 38.6 36.0 - 46.0 %   MCV 88.1 78.0 - 100.0 fl   MCHC 32.6 30.0 - 36.0 g/dL   RDW 14.5 11.5 - 15.5 %  POCT Glucose (CBG)  Result Value Ref Range   POC Glucose 136 (A) 70 - 99 mg/dl   Message to pt Metabolic profile is ok Your A1c actually looks good- no change from previous Blood  count is normal Your vitamin D is low again-  I am going to rx a once weekly rx supplement for you to use for 12 weeks.  Following that, please continue to take a 2,000 IU vitamin D supplement daily to maintain your level Please see me in about 6 months for a recheck and take care!

## 2017-12-06 ENCOUNTER — Other Ambulatory Visit: Payer: Self-pay | Admitting: Family Medicine

## 2017-12-06 DIAGNOSIS — E559 Vitamin D deficiency, unspecified: Secondary | ICD-10-CM

## 2017-12-20 ENCOUNTER — Telehealth: Payer: Self-pay | Admitting: *Deleted

## 2017-12-20 NOTE — Telephone Encounter (Signed)
Received results from [facility]; forwarded to provider/SLS 08/15

## 2018-05-19 ENCOUNTER — Other Ambulatory Visit: Payer: Self-pay | Admitting: Family Medicine

## 2018-05-19 DIAGNOSIS — I10 Essential (primary) hypertension: Secondary | ICD-10-CM

## 2018-05-28 ENCOUNTER — Other Ambulatory Visit: Payer: Self-pay | Admitting: Family Medicine

## 2018-05-28 DIAGNOSIS — E559 Vitamin D deficiency, unspecified: Secondary | ICD-10-CM

## 2018-06-29 NOTE — Progress Notes (Addendum)
[place] [facility] [address], [place], [place] [address] [phone]  Date:  07/01/2018   Name:  Autumn Cook   DOB:  1960-10-20   MRN:  [id number]  PCP:  Darreld Mclean, MD    Chief Complaint: Diabetes (stopped metformin due to diarrhea) and Hypertension   History of Present Illness:  Autumn Cook is a 58 y.o. very pleasant female patient who presents with the following:  29-monthfollow-up visit today, for this patient with history of diabetes, hyperlipidemia, hypertension, migraine headache. I last saw her in June of 2019  She had been using metformin, and long-acting insulin 14u, admits she does not use it every day  However she is not currently taking metformin; she just cannot tolerate it  At her last visit we did increase her lisinopril as her blood pressure was a bit high Most recent labs showed well-controlled diabetes, and low vitamin D  Lab Results  Component Value Date   HGBA1C 6.4 10/11/2017   Pap: she thinks 2 years ago, was ok Due for A1c- she is not fasting today  Pneumovax: declines shingrix- declines Flu shot: declines today  Colon cancer screening-10 years ago.  She is now due Her last colon was normal  No family history, discussed options Will order cologuard  mammo 9/18-we can get a mammogram for her today  Discussed diabetes management.  If her A1c is high, we plan to add an SGLT2 inhibitor Blood pressure looks fine  Her youngest child recently got married, they are expecting a baby in the fall Overall her family is doing well  BP Readings from Last 3 Encounters:  07/01/18 126/88  10/11/17 (!) 150/92  04/26/17 138/79   Wt Readings from Last 3 Encounters:  07/01/18 202 lb (91.6 kg)  10/11/17 196 lb (88.9 kg)  04/26/17 200 lb 3.2 oz (90.8 kg)     Patient Active Problem List   Diagnosis Date Noted  . Uncontrolled diabetes mellitus type 2 without complications ([place]  100/86/7619 . Achilles tendonitis, bilateral 06/11/2015  . Chest pain with moderate risk for cardiac etiology 10/13/2014  . Hypercholesterolemia 10/13/2014  . Idiopathic scoliosis 08/18/2014  . Hypertension 09/11/2012  . Overweight (BMI 25.0-29.9) 09/11/2012  . Migraine, unspecified, without mention of intractable migraine without mention of status migrainosus 09/11/2012  . Hx of thyroid nodule 09/11/2012  . History of tobacco use 09/11/2012    Past Medical History:  Diagnosis Date  . Allergy   . Arthritis   . Diabetes mellitus without complication ([place]   . Hypertension   . Migraine     No past surgical history on file.  Social History   Tobacco Use  . Smoking status: Former SResearch scientist (life sciences) . Smokeless tobacco: Never Used  Substance Use Topics  . Alcohol use: No  . Drug use: No    Family History  Problem Relation Age of Onset  . Hypertension Father   . Cancer Father 736      lung cancer  . Stroke Father   . Heart disease Mother        arrhythmia  . Hypertension Mother   . Kidney failure Sister     Allergies  Allergen Reactions  . Metformin And Related Diarrhea  . Asa [Aspirin] Other (See Comments)    GI upset   . Penicillins Hives    Medication list has been reviewed and updated.  Current Outpatient Medications on File Prior  to Visit  Medication Sig Dispense Refill  . albuterol (PROVENTIL HFA;VENTOLIN HFA) 108 (90 Base) MCG/ACT inhaler Inhale 2 puffs into the lungs every 4 (four) hours as needed for wheezing or shortness of breath (cough, shortness of breath or wheezing.). 1 Inhaler 1  . atenolol (TENORMIN) 100 MG tablet TAKE 1 TABLET BY MOUTH EVERY DAY 30 tablet 5  . blood glucose meter kit and supplies KIT Dispense based on patient and insurance preference. Use up to four times daily as directed. (FOR ICD-9 250.00, 250.01). 1 each 0  . glucose blood (ACCU-CHEK ACTIVE STRIPS) test strip Use as instructed- check blood sugar up to 2x a day 100 each 12  . Insulin  Glargine (BASAGLAR KWIKPEN) 100 UNIT/ML SOPN INJECT 14 UNITS SUBCUTANEOUSLY DAILY 15 mL 3  . Insulin Pen Needle (PEN NEEDLES) 32G X 4 MM MISC 1 each by Does not apply route daily. 100 each 3  . Insulin Pen Needle (PEN NEEDLES) 32G X 4 MM MISC 1 each by Does not apply route daily. 100 each 3  . lisinopril-hydrochlorothiazide (ZESTORETIC) 20-12.5 MG tablet Take 2 tablets by mouth daily. 180 tablet 3  . ONE TOUCH ULTRA TEST test strip Check sugar 2 times daily 100 each prn  . ONETOUCH DELICA LANCETS 14G MISC Check sugar 2 times daily 100 each prn  . Vitamin D, Ergocalciferol, (DRISDOL) 1.25 MG (50000 UT) CAPS capsule TAKE 1 CAPSULE BY MOUTH EVERY 7 DAYS 12 capsule 1   No current facility-administered medications on file prior to visit.     Review of Systems:  As per HPI- otherwise negative. No fever chills, no chest pain or shortness of breath  Physical Examination: Vitals:   07/01/18 1010  BP: 126/88  Pulse: 75  Resp: 16  Temp: 97.9 F (36.6 C)  SpO2: 98%   Vitals:   07/01/18 1010  Weight: 202 lb (91.6 kg)  Height: _0  (1.676 m)   Body mass index is 32.6 kg/m. Ideal Body Weight: Weight in (lb) to have BMI = 25: 154.6  GEN: WDWN, NAD, Non-toxic, A & O x 3, obese, looks well HEENT: Atraumatic, Normocephalic. Neck supple. No masses, No LAD.  Bilateral TM wnl, oropharynx normal.  PEERL,EOMI.    Ears and Nose: No external deformity. CV: RRR, No M/G/R. No JVD. No thrill. No extra heart sounds. PULM: CTA B, no wheezes, crackles, rhonchi. No retractions. No resp. distress. No accessory muscle use. ABD: S, NT, ND EXTR: No c/c/e NEURO Normal gait.  PSYCH: Normally interactive. Conversant. Not depressed or anxious appearing.  Calm demeanor.    Assessment and Plan: Essential hypertension - Plan: CBC, Comprehensive metabolic panel  Vitamin D deficiency - Plan: Vitamin D (25 hydroxy)  Diabetes mellitus type 2 in obese ([place]) - Plan: Comprehensive metabolic panel, Hemoglobin  A1c  Screening for hyperlipidemia - Plan: Lipid panel  Screening for breast cancer - Plan: MM 3D SCREEN BREAST BILATERAL  Screening for colon cancer  Here today for a follow-up visit Blood pressure looks great on current medication Other routine labs pending as above Mammogram today Order Cologuard Will plan further follow- up pending labs.   Signed Lamar Blinks, MD  Received her labs, as follows  Results for orders placed or performed in visit on 07/01/18  CBC  Result Value Ref Range   WBC 6.5 4.0 - 10.5 K/uL   RBC 4.47 3.87 - 5.11 Mil/uL   Platelets 331.0 150.0 - 400.0 K/uL   Hemoglobin 13.0 12.0 - 15.0 g/dL   HCT  38.7 36.0 - 46.0 %   MCV 86.5 78.0 - 100.0 fl   MCHC 33.6 30.0 - 36.0 g/dL   RDW 14.0 11.5 - 15.5 %  Comprehensive metabolic panel  Result Value Ref Range   Sodium 138 135 - 145 mEq/L   Potassium 3.8 3.5 - 5.1 mEq/L   Chloride 97 96 - 112 mEq/L   CO2 31 19 - 32 mEq/L   Glucose, Bld 307 (H) 70 - 99 mg/dL   BUN 16 6 - 23 mg/dL   Creatinine, Ser 0.79 0.40 - 1.20 mg/dL   Total Bilirubin 0.3 0.2 - 1.2 mg/dL   Alkaline Phosphatase 65 39 - 117 U/L   AST 10 0 - 37 U/L   ALT 11 0 - 35 U/L   Total Protein 7.1 6.0 - 8.3 g/dL   Albumin 4.3 3.5 - 5.2 g/dL   Calcium 10.0 8.4 - 10.5 mg/dL   GFR 91.08 >60.00 mL/min  Hemoglobin A1c  Result Value Ref Range   Hgb A1c MFr Bld 7.9 (H) 4.6 - 6.5 %  Lipid panel  Result Value Ref Range   Cholesterol 265 (H) 0 - 200 mg/dL   Triglycerides 260.0 (H) 0.0 - 149.0 mg/dL   HDL 48.60 >39.00 mg/dL   VLDL 52.0 (H) 0.0 - 40.0 mg/dL   Total CHOL/HDL Ratio 5    NonHDL 215.92   Vitamin D (25 hydroxy)  Result Value Ref Range   VITD 35.17 30.00 - 100.00 ng/mL  LDL cholesterol, direct  Result Value Ref Range   Direct LDL 188.0 mg/dL   Blood counts are normal Your vitamin D looks fine Your A1c has gone up by about 1.5%, since last check.  I would like to add a medication like we discussed, which will help you lose glucose  through your urine.  This will help [place] bring her A1c down without having to add more insulin With this be okay with you?  If so, I will call in a prescription  Also, your cholesterol is too high.  I would recommend that we start a cholesterol medication.  If you are okay with this, I will send a prescription  In any case, let us plan to recheck in 3 to 4 months

## 2018-07-01 ENCOUNTER — Encounter: Payer: Self-pay | Admitting: Family Medicine

## 2018-07-01 ENCOUNTER — Ambulatory Visit (INDEPENDENT_AMBULATORY_CARE_PROVIDER_SITE_OTHER): Payer: Managed Care, Other (non HMO) | Admitting: Family Medicine

## 2018-07-01 ENCOUNTER — Ambulatory Visit (HOSPITAL_BASED_OUTPATIENT_CLINIC_OR_DEPARTMENT_OTHER)
Admission: RE | Admit: 2018-07-01 | Discharge: 2018-07-01 | Disposition: A | Discharge status: Home / Self Care | Payer: Managed Care, Other (non HMO) | Source: Ambulatory Visit | Attending: Family Medicine | Admitting: Family Medicine

## 2018-07-01 VITALS — BP 126/88 | HR 75 | Temp 97.9°F | Resp 16 | Ht 66.0 in | Wt 202.0 lb

## 2018-07-01 DIAGNOSIS — E559 Vitamin D deficiency, unspecified: Secondary | ICD-10-CM

## 2018-07-01 DIAGNOSIS — Z1239 Encounter for other screening for malignant neoplasm of breast: Secondary | ICD-10-CM

## 2018-07-01 DIAGNOSIS — Z1231 Encounter for screening mammogram for malignant neoplasm of breast: Secondary | ICD-10-CM | POA: Diagnosis not present

## 2018-07-01 DIAGNOSIS — Z13220 Encounter for screening for lipoid disorders: Secondary | ICD-10-CM | POA: Diagnosis not present

## 2018-07-01 DIAGNOSIS — I10 Essential (primary) hypertension: Secondary | ICD-10-CM

## 2018-07-01 DIAGNOSIS — Z1211 Encounter for screening for malignant neoplasm of colon: Secondary | ICD-10-CM

## 2018-07-01 DIAGNOSIS — E669 Obesity, unspecified: Secondary | ICD-10-CM

## 2018-07-01 DIAGNOSIS — E1169 Type 2 diabetes mellitus with other specified complication: Secondary | ICD-10-CM

## 2018-07-01 LAB — COMPREHENSIVE METABOLIC PANEL
ALT: 11 U/L (ref 0–35)
AST: 10 U/L (ref 0–37)
Albumin: 4.3 g/dL (ref 3.5–5.2)
Alkaline Phosphatase: 65 U/L (ref 39–117)
BUN: 16 mg/dL (ref 6–23)
CHLORIDE: 97 meq/L (ref 96–112)
CO2: 31 meq/L (ref 19–32)
CREATININE: 0.79 mg/dL (ref 0.40–1.20)
Calcium: 10 mg/dL (ref 8.4–10.5)
GFR: 91.08 mL/min (ref >60.00)
Glucose, Bld: 307 mg/dL — ABNORMAL HIGH (ref 70–99)
Potassium: 3.8 mEq/L (ref 3.5–5.1)
Sodium: 138 mEq/L (ref 135–145)
Total Bilirubin: 0.3 mg/dL (ref 0.2–1.2)
Total Protein: 7.1 g/dL (ref 6.0–8.3)

## 2018-07-01 LAB — CBC
HCT: 38.7 % (ref 36.0–46.0)
Hemoglobin: 13 g/dL (ref 12.0–15.0)
MCHC: 33.6 g/dL (ref 30.0–36.0)
MCV: 86.5 fl (ref 78.0–100.0)
Platelets: 331 10*3/uL (ref 150.0–400.0)
RBC: 4.47 Mil/uL (ref 3.87–5.11)
RDW: 14 % (ref 11.5–15.5)
WBC: 6.5 10*3/uL (ref 4.0–10.5)

## 2018-07-01 LAB — HEMOGLOBIN A1C: Hgb A1c MFr Bld: 7.9 % — ABNORMAL HIGH (ref 4.6–6.5)

## 2018-07-01 LAB — LIPID PANEL
CHOL/HDL RATIO: 5
Cholesterol: 265 mg/dL — ABNORMAL HIGH (ref 0–200)
HDL: 48.6 mg/dL (ref >39.00)
NonHDL: 215.92
Triglycerides: 260 mg/dL — ABNORMAL HIGH (ref 0.0–149.0)
VLDL: 52 mg/dL — AB (ref 0.0–40.0)

## 2018-07-01 LAB — LDL CHOLESTEROL, DIRECT: LDL DIRECT: 188 mg/dL

## 2018-07-01 LAB — VITAMIN D 25 HYDROXY (VIT D DEFICIENCY, FRACTURES): VITD: 35.17 ng/mL (ref 30.00–100.00)

## 2018-07-01 NOTE — Patient Instructions (Signed)
We will set up a Cologuard kit for you to screen for colon cancer Also you are due for a mammogram- they can do this for you today  BP looks great!   We will get an A1c for you; if it has gone up, may add a medication such as Wilder Glade to help lose sugar through your urine   Will be in touch with your reports Take care!  Let's plan for 4-6 months depending on your lab results

## 2018-07-02 ENCOUNTER — Other Ambulatory Visit: Payer: Self-pay | Admitting: Family Medicine

## 2018-07-02 DIAGNOSIS — R928 Other abnormal and inconclusive findings on diagnostic imaging of breast: Secondary | ICD-10-CM

## 2018-07-04 ENCOUNTER — Telehealth: Payer: Self-pay | Admitting: *Deleted

## 2018-07-04 NOTE — Telephone Encounter (Signed)
Received Physician Orders from [place]; forwarded to provider/SLS 02/27

## 2018-07-05 ENCOUNTER — Ambulatory Visit
Admission: RE | Admit: 2018-07-05 | Discharge: 2018-07-05 | Disposition: A | Discharge status: Home / Self Care | Payer: Managed Care, Other (non HMO) | Source: Ambulatory Visit | Attending: Family Medicine | Admitting: Family Medicine

## 2018-07-05 ENCOUNTER — Other Ambulatory Visit: Payer: Self-pay | Admitting: Family Medicine

## 2018-07-05 DIAGNOSIS — N6310 Unspecified lump in the right breast, unspecified quadrant: Secondary | ICD-10-CM

## 2018-07-05 DIAGNOSIS — R928 Other abnormal and inconclusive findings on diagnostic imaging of breast: Secondary | ICD-10-CM

## 2018-07-05 DIAGNOSIS — R599 Enlarged lymph nodes, unspecified: Secondary | ICD-10-CM

## 2018-07-10 ENCOUNTER — Ambulatory Visit
Admission: RE | Admit: 2018-07-10 | Discharge: 2018-07-10 | Disposition: A | Discharge status: Home / Self Care | Payer: Managed Care, Other (non HMO) | Source: Ambulatory Visit | Attending: Family Medicine | Admitting: Family Medicine

## 2018-07-10 DIAGNOSIS — R599 Enlarged lymph nodes, unspecified: Secondary | ICD-10-CM

## 2018-07-10 DIAGNOSIS — N6310 Unspecified lump in the right breast, unspecified quadrant: Secondary | ICD-10-CM

## 2018-07-17 ENCOUNTER — Telehealth: Payer: Self-pay | Admitting: Oncology

## 2018-07-17 ENCOUNTER — Encounter: Payer: Self-pay | Admitting: *Deleted

## 2018-07-17 NOTE — Telephone Encounter (Signed)
Spoke with patient to confirm morning [facility] appointment for 3/18, packet emailed to patient

## 2018-07-22 ENCOUNTER — Encounter: Payer: Self-pay | Admitting: *Deleted

## 2018-07-23 ENCOUNTER — Other Ambulatory Visit: Payer: Self-pay | Admitting: *Deleted

## 2018-07-23 DIAGNOSIS — C50811 Malignant neoplasm of overlapping sites of right female breast: Secondary | ICD-10-CM

## 2018-07-23 DIAGNOSIS — Z170 Estrogen receptor positive status [ER+]: Principal | ICD-10-CM

## 2018-07-23 NOTE — Progress Notes (Signed)
[facility]  Telephone:([phone]) [phone] Fax:(336) [phone]    ID: CAMELLIA POPESCU DOB: 15-Dec-1960  MR#: [id number]  KGS#:[id number]  Patient Care Team: Darreld Mclean, MD as PCP - General (Family Medicine) Molli Posey, MD as Consulting Physician (Obstetrics and Gynecology) Mauro Kaufmann, RN as Oncology Nurse Navigator Rockwell Germany, RN as Oncology Nurse Navigator Alphonsa Overall, MD as Consulting Physician (General Surgery) Torren Maffeo, Virgie Dad, MD as Consulting Physician (Oncology) Gery Pray, MD as Consulting Physician (Radiation Oncology) OTHER MD:  Dr. 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 She underwent right breast ultrasonography at [facility] on 07/05/2018 showing: A 1.4 x 1.2 x 1.4 cm oval hypoechoic mass at the 5:30 position 8 cm from the nipple. A 0.9 x 1 x 1 cm slightly irregular hypoechoic mass at the 10:30 position 9 cm from the nipple. A 3 x 1.9 x 3 cm probable lymph node without fatty hilum in the LOWER RIGHT axilla. A RIGHT axillary lymph node with slightly thickened cortex.  Accordingly on 07/10/2018 she proceeded to biopsy of the right breast area in question. The pathology from this procedure showed ([id number]): invasive ductal carcinoma, high grade, 5:30 o'clock. Prognostic indicators significant for: estrogen receptor, 30% positive with weak staining intensity and progesterone receptor, 0% negative. Proliferation marker Ki67 at 70%.   An additional biopsy was performed on the same day ([id number]) showing:  2. Breast, right, needle core biopsy, 10 o'clock - Invasive carcinoma  Finally, an additional biopsy  was performed at the right axilla on the same day ([id number]) showing: invasive carcinoma, right axilla. Prognostic indicators significant for: estrogen receptor, 70% positive, with weak staining intensity and progesterone receptor, 0% negative. Proliferation marker Ki67 at 70%. HER2 positive by immunohistochemistry.    The patient's subsequent history is as detailed below.   INTERVAL HISTORY: Eris was evaluated in the multidisciplinary breast cancer clinic on 07/24/2018 accompanied by her daughter, [name].   Her case was also presented at the multidisciplinary breast cancer conference on the same day. At that time a preliminary plan was proposed: Genetics testing, neoadjuvant chemotherapy, breast conserving surgery with hopefully targeted axillary dissection versus axillary lymph node dissection, adjuvant radiation.  REVIEW OF SYSTEMS: Juanitta denies unusual headaches, visual changes, nausea, vomiting, stiff neck, dizziness, or gait imbalance. There has been no cough, phlegm production, or pleurisy, no chest pain or pressure, and no change in bowel or bladder habits. The patient denies fever, rash, bleeding, unexplained fatigue or unexplained weight loss. A detailed review of systems was otherwise entirely negative.   PAST MEDICAL HISTORY: Past Medical History:  Diagnosis Date   Allergy    Arthritis    Asthma    Diabetes mellitus without complication ([place])    Hypertension    Migraine      PAST SURGICAL HISTORY: History reviewed. No pertinent surgical history.   FAMILY HISTORY: Family History  Problem Relation Age of Onset   Hypertension Father    Cancer Father 78       lung cancer   Stroke Father    Heart disease Mother        arrhythmia   Hypertension Mother    Kidney failure Sister  Ramon's father died from lung cancer at age 53; he was a heavy smoker. Patients' mother is 65 as of 07/2018. The patient has 3 brothers and 2 sisters. Patient denies anyone in  her family having breast, ovarian, prostate, or pancreatic cancer. Lerlene's maternal great great uncle was diagnosed with lung cancer.     GYNECOLOGIC HISTORY:  Patient's last menstrual period was 11/14/2013. 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 Her husband, [name], is a Administrator. At home with them is two toy poodles. Angelissa has 4 children, [name], Eppie Gibson, and [name]. [name] is 45, lives in [place], and works in Science writer. [name] is 83, lives in [place], and works in Engineer, technical sales. [name] is 86, lives in [place], [place], and works as a Control and instrumentation engineer. [name] is 61, lives in [place], and is a Buyer, retail.   ADVANCED DIRECTIVES: In the absence of any documentation, Amanie's spouse, [name], is her healthcare power of attorney.     HEALTH MAINTENANCE: Social History   Tobacco Use   Smoking status: Former Smoker    Packs/day: 1.00    Years: 5.00    Pack years: 5.00   Smokeless tobacco: Never Used  Substance Use Topics   Alcohol use: No   Drug use: No    Colonoscopy: yes, 2010 - due 2020.  PAP: 2017  Bone density: yes, 2015, normal Mammography: 07/01/2018   Allergies  Allergen Reactions   Metformin And Related Diarrhea   Asa [Aspirin] Other (See Comments)    GI upset    Penicillins Hives    Current Outpatient Medications  Medication Sig Dispense Refill   albuterol (PROVENTIL HFA;VENTOLIN HFA) 108 (90 Base) MCG/ACT inhaler Inhale 2 puffs into the lungs every 4 (four) hours as needed for wheezing or shortness of breath (cough, shortness of breath or wheezing.). 1 Inhaler 1   atenolol (TENORMIN) 100 MG tablet TAKE 1 TABLET BY MOUTH EVERY DAY 30 tablet 5   blood glucose meter kit and supplies KIT Dispense based on patient and insurance preference. Use up to four times daily as  directed. (FOR ICD-9 250.00, 250.01). 1 each 0   glucose blood (ACCU-CHEK ACTIVE STRIPS) test strip Use as instructed- check blood sugar up to 2x a day 100 each 12   Insulin Glargine (BASAGLAR KWIKPEN) 100 UNIT/ML SOPN INJECT 14 UNITS SUBCUTANEOUSLY DAILY 15 mL 3   Insulin Pen Needle (PEN NEEDLES) 32G X 4 MM MISC 1 each by Does not apply route daily. 100 each 3   Insulin Pen Needle (PEN NEEDLES) 32G X 4 MM MISC 1 each by Does not apply route daily. 100 each 3   lisinopril-hydrochlorothiazide (ZESTORETIC) 20-12.5 MG tablet Take 2 tablets by mouth daily. 180 tablet 3   ONE TOUCH ULTRA TEST test strip Check sugar 2 times daily 100 each prn   ONETOUCH DELICA LANCETS 90Z MISC Check sugar 2 times daily 100 each prn   Vitamin D, Ergocalciferol, (DRISDOL) 1.25 MG (50000 UT) CAPS capsule TAKE 1 CAPSULE BY MOUTH EVERY 7 DAYS 12 capsule 1   No current facility-administered medications for this visit.      OBJECTIVE: Middle-aged African-American woman in no acute distress  Vitals:   07/24/18 0914  BP: (!) 181/87  Pulse: (!) 59  Resp: 18  Temp: 98.3 F (36.8 C)  SpO2: 100%  Body mass index is 32.02 kg/m.   Wt Readings from Last 3 Encounters:  07/24/18 198 lb 6.4 oz (90 kg)  07/01/18 202 lb (91.6 kg)  10/11/17 196 lb (88.9 kg)      ECOG FS:0 - Asymptomatic  Ocular: Sclerae unicteric, pupils round and equal Ear-nose-throat: Oropharynx clear and moist Lymphatic: No cervical or supraclavicular adenopathy Lungs no rales or rhonchi Heart regular rate and rhythm Abd soft, nontender, positive bowel sounds MSK no focal spinal tenderness, no joint edema Neuro: non-focal, well-oriented, appropriate affect Breasts: The right breast is status post recent biopsy.  There is no significant ecchymosis.  I do not palpate a well-defined mass in either the right breast or the right axilla.  There are no skin or nipple changes of concern.  The left breast is benign.  The left axilla is  benign.   LAB RESULTS:  CMP     Component Value Date/Time   NA 137 07/24/2018 0825   K 3.5 07/24/2018 0825   CL 96 (L) 07/24/2018 0825   CO2 29 07/24/2018 0825   GLUCOSE 460 (H) 07/24/2018 0825   BUN 14 07/24/2018 0825   CREATININE 1.14 (H) 07/24/2018 0825   CREATININE 0.70 04/14/2015 1558   CALCIUM 10.0 07/24/2018 0825   PROT 7.9 07/24/2018 0825   ALBUMIN 3.9 07/24/2018 0825   AST 12 (L) 07/24/2018 0825   ALT 15 07/24/2018 0825   ALKPHOS 82 07/24/2018 0825   BILITOT 0.5 07/24/2018 0825   GFRNONAA 54 (L) 07/24/2018 0825   GFRNONAA 87 03/27/2014 1130   GFRAA >60 07/24/2018 0825   GFRAA >89 03/27/2014 1130    No results found for: TOTALPROTELP, ALBUMINELP, A1GS, A2GS, BETS, BETA2SER, GAMS, MSPIKE, SPEI  No results found for: KPAFRELGTCHN, LAMBDASER, KAPLAMBRATIO  Lab Results  Component Value Date   WBC 6.5 07/24/2018   NEUTROABS 3.6 07/24/2018   HGB 12.9 07/24/2018   HCT 38.8 07/24/2018   MCV 86.0 07/24/2018   PLT 342 07/24/2018    @LASTCHEMISTRY @  No results found for: LABCA2  No components found for: GNOIBB048  No results for input(s): INR in the last 168 hours.  No results found for: LABCA2  No results found for: GQB169  No results found for: IHW388  No results found for: EKC003  No results found for: CA2729  No components found for: HGQUANT  No results found for: CEA1 / No results found for: CEA1   No results found for: AFPTUMOR  No results found for: CHROMOGRNA  No results found for: PSA1  Appointment on 07/24/2018  Component Date Value Ref Range Status   Sodium 07/24/2018 137  135 - 145 mmol/L Final   Potassium 07/24/2018 3.5  3.5 - 5.1 mmol/L Final   Chloride 07/24/2018 96* 98 - 111 mmol/L Final   CO2 07/24/2018 29  22 - 32 mmol/L Final   Glucose, Bld 07/24/2018 460* 70 - 99 mg/dL Final   BUN 07/24/2018 14  6 - 20 mg/dL Final   Creatinine 07/24/2018 1.14* 0.44 - 1.00 mg/dL Final   Calcium 07/24/2018 10.0  8.9 - 10.3 mg/dL  Final   Total Protein 07/24/2018 7.9  6.5 - 8.1 g/dL Final   Albumin 07/24/2018 3.9  3.5 - 5.0 g/dL Final   AST 07/24/2018 12* 15 - 41 U/L Final   ALT 07/24/2018 15  0 - 44 U/L Final   Alkaline Phosphatase 07/24/2018 82  38 - 126 U/L Final   Total Bilirubin 07/24/2018 0.5  0.3 - 1.2 mg/dL Final  GFR, Est Non Af Am 07/24/2018 54* >60 mL/min Final   GFR, Est AFR Am 07/24/2018 >60  >60 mL/min Final   Anion gap 07/24/2018 12  5 - 15 Final   Performed at [facility], [place] [address] [place], [place] [address]   WBC Count 07/24/2018 6.5  4.0 - 10.5 K/uL Final   RBC 07/24/2018 4.51  3.87 - 5.11 MIL/uL Final   Hemoglobin 07/24/2018 12.9  12.0 - 15.0 g/dL Final   HCT 07/24/2018 38.8  36.0 - 46.0 % Final   MCV 07/24/2018 86.0  80.0 - 100.0 fL Final   MCH 07/24/2018 28.6  26.0 - 34.0 pg Final   MCHC 07/24/2018 33.2  30.0 - 36.0 g/dL Final   RDW 07/24/2018 12.9  11.5 - 15.5 % Final   Platelet Count 07/24/2018 342  150 - 400 K/uL Final   nRBC 07/24/2018 0.0  0.0 - 0.2 % Final   Neutrophils Relative % 07/24/2018 55  % Final   Neutro Abs 07/24/2018 3.6  1.7 - 7.7 K/uL Final   Lymphocytes Relative 07/24/2018 36  % Final   Lymphs Abs 07/24/2018 2.3  0.7 - 4.0 K/uL Final   Monocytes Relative 07/24/2018 6  % Final   Monocytes Absolute 07/24/2018 0.4  0.1 - 1.0 K/uL Final   Eosinophils Relative 07/24/2018 2  % Final   Eosinophils Absolute 07/24/2018 0.1  0.0 - 0.5 K/uL Final   Basophils Relative 07/24/2018 1  % Final   Basophils Absolute 07/24/2018 0.0  0.0 - 0.1 K/uL Final   Immature Granulocytes 07/24/2018 0  % Final   Abs Immature Granulocytes 07/24/2018 0.02  0.00 - 0.07 K/uL Final   Performed at [facility], [place] [address] [place], [place] [address]    (this displays the last labs from the last 3 days)  No results found for: TOTALPROTELP, ALBUMINELP, A1GS, A2GS, BETS, BETA2SER, GAMS, MSPIKE, SPEI (this  displays SPEP labs)  No results found for: KPAFRELGTCHN, LAMBDASER, KAPLAMBRATIO (kappa/lambda light chains)  No results found for: HGBA, HGBA2QUANT, HGBFQUANT, HGBSQUAN (Hemoglobinopathy evaluation)   No results found for: LDH  No results found for: IRON, TIBC, IRONPCTSAT (Iron and TIBC)  No results found for: FERRITIN  Urinalysis    Component Value Date/Time   COLORURINE YELLOW 03/07/2016 1350   APPEARANCEUR CLEAR 03/07/2016 1350   LABSPEC 1.031 (H) 03/07/2016 1350   PHURINE 5.0 03/07/2016 1350   GLUCOSEU >1000 (A) 03/07/2016 1350   HGBUR NEGATIVE 03/07/2016 1350   BILIRUBINUR NEGATIVE 03/07/2016 1350   BILIRUBINUR neg 11/24/2013 1547   KETONESUR 15 (A) 03/07/2016 1350   PROTEINUR NEGATIVE 03/07/2016 1350   UROBILINOGEN 0.2 11/24/2013 1547   UROBILINOGEN 0.2 11/10/2006 1153   NITRITE NEGATIVE 03/07/2016 1350   LEUKOCYTESUR NEGATIVE 03/07/2016 1350     STUDIES:  US Breast Ltd Uni Right Inc Axilla  Result Date: 07/05/2018 CLINICAL DATA:  58 year old female with RIGHT breast masses identified on screening mammogram. EXAM: ULTRASOUND OF THE RIGHT BREAST COMPARISON:  Previous exam(s). FINDINGS: Targeted ultrasound of the RIGHT breast is performed, showing following: A 1.4 x 1.2 x 1.4 cm oval hypoechoic mass at the 5:30 position 8 cm from the nipple A 0.9 x 1 x 1 cm slightly irregular hypoechoic mass at the 10:30 position 9 cm from the nipple A 3 x 1.9 x 3 cm probable lymph node without fatty hilum in the LOWER RIGHT axilla. A RIGHT axillary lymph node with slightly thickened cortex. IMPRESSION: 1. 1.4 cm mass  in the LOWER INNER RIGHT breast, 1 cm mass in the UPPER-OUTER RIGHT breast and markedly enlarged LOWER RIGHT axillary lymph node. The 2 masses within the RIGHT breast may represent masses or abnormal enlarged intraparenchymal lymph nodes. Tissue sampling is recommended. RECOMMENDATION: Ultrasound-guided biopsies of both RIGHT breast masses and markedly enlarged LOWER RIGHT  axillary lymph node, to evaluate for malignancy/lymphoma. These procedures will be arranged. I have discussed the findings and recommendations with the patient. Results were also provided in writing at the conclusion of the visit. If applicable, a reminder letter will be sent to the patient regarding the next appointment. BI-RADS CATEGORY  4: Suspicious. Electronically Signed   By: Margarette Canada M.D.   On: 07/05/2018 13:36   Mm 3d Screen Breast Bilateral  Result Date: 07/01/2018 CLINICAL DATA:  Screening. EXAM: DIGITAL SCREENING BILATERAL MAMMOGRAM WITH TOMO AND CAD COMPARISON:  Previous exam(s). ACR Breast Density Category b: There are scattered areas of fibroglandular density. FINDINGS: In the right breast, possible masses warrant further evaluation. In the left breast, no findings suspicious for malignancy. Images were processed with CAD. IMPRESSION: Further evaluation is suggested for possible masses in the right breast. RECOMMENDATION: Diagnostic mammogram and possibly ultrasound of the right breast. (Code:[id number]) The patient will be contacted regarding the findings, and additional imaging will be scheduled. BI-RADS CATEGORY  0: Incomplete. Need additional imaging evaluation and/or prior mammograms for comparison. Electronically Signed   By: Ammie Ferrier M.D.   On: 07/01/2018 14:03   [place] Axillary Node Core Biopsy Right  Addendum Date: 07/16/2018   ADDENDUM REPORT: 07/16/2018 07:34 ADDENDUM: Pathology revealed HIGH GRADE INVASIVE CARCINOMA of the Right breast, both locations, 5:30 o'clock and 10 o'clock. In the absence of other lesions, the phenotype would be consistent with breast carcinoma. Pathology revealed INVASIVE CARCINOMA of the Right axilla. No residual nodal tissue identified. This was found to be concordant by Dr. Dorise Bullion. Pathology results were discussed with the patient by telephone. The patient reported doing well after the biopsies with tenderness at the sites. Post biopsy  instructions and care were reviewed and questions were answered. The patient was encouraged to call [facility] for any additional concerns. The patient was referred to [facility] on July 24, 2018. Pathology results reported by Terie Purser, RN on 07/16/2018. Electronically Signed   By: Dorise Bullion III M.D   On: 07/16/2018 07:34   Result Date: 07/16/2018 CLINICAL DATA:  Biopsy of right breast mass at 5:30. Biopsy of a right breast mass at 10. Biopsy of right axillary lymph node. EXAM: ULTRASOUND GUIDED RIGHT BREAST CORE NEEDLE BIOPSY COMPARISON:  Previous exam(s). FINDINGS: I met with the patient and we discussed the procedure of ultrasound-guided biopsy, including benefits and alternatives. We discussed the high likelihood of a successful procedure. We discussed the risks of the procedure, including infection, bleeding, tissue injury, clip migration, and inadequate sampling. Informed written consent was given. The usual time-out protocol was performed immediately prior to the procedure. Lesion quadrant: Lower-inner Using sterile technique and 1% Lidocaine as local anesthetic, under direct ultrasound visualization, a 12 gauge spring-loaded device was used to perform biopsy of the right breast mass at 5:30 using a lateral approach. At the conclusion of the procedure a tissue marker clip was deployed into the biopsy cavity. Follow up 2 view mammogram was performed and dictated separately. Lesion quadrant: Upper-outer Using sterile technique and 1% Lidocaine as local anesthetic, under direct ultrasound  visualization, a 12 gauge spring-loaded device was used to perform biopsy of the right breast mass at 10 using a lateral approach. At the conclusion of the procedure a tissue marker clip was deployed into the biopsy cavity. Follow up 2 view mammogram was performed and dictated separately. Lesion quadrant: Right  axilla. Using sterile technique and 1% Lidocaine as local anesthetic, under direct ultrasound visualization, a 14 gauge spring-loaded device was used to perform biopsy of an abnormal right axillary lymph node using a lateral approach. At the conclusion of the procedure a HydroMARK tissue marker clip was deployed into the biopsy cavity. Follow up 2 view mammogram was performed and dictated separately. IMPRESSION: Ultrasound guided biopsy of 2 right breast masses and an abnormal right axillary lymph node. No apparent complications. Electronically Signed: By: Dorise Bullion III M.D On: 07/10/2018 15:57   Mm Clip Placement Right  Result Date: 07/10/2018 CLINICAL DATA:  Evaluate for biopsy clip EXAM: DIAGNOSTIC RIGHT MAMMOGRAM POST ULTRASOUND BIOPSY COMPARISON:  Previous exam(s). FINDINGS: Mammographic images were obtained following ultrasound guided biopsy of 2 right breast masses and a right axillary lymph node. The ribbon shaped clip is within the biopsied 530 right breast mass. The coil shaped clip is within the right-sided 10 o'clock right breast mass. HydroMARK clip is within the abnormal right axillary lymph node. IMPRESSION: Appropriate clip placement as above. Final Assessment: Post Procedure Mammograms for Marker Placement Electronically Signed   By: Dorise Bullion III M.D   On: 07/10/2018 16:24   [place] Rt Breast Bx W Loc Dev 1st Lesion Img Bx Spec US Guide  Addendum Date: 07/16/2018   ADDENDUM REPORT: 07/16/2018 07:34 ADDENDUM: Pathology revealed HIGH GRADE INVASIVE CARCINOMA of the Right breast, both locations, 5:30 o'clock and 10 o'clock. In the absence of other lesions, the phenotype would be consistent with breast carcinoma. Pathology revealed INVASIVE CARCINOMA of the Right axilla. No residual nodal tissue identified. This was found to be concordant by Dr. Dorise Bullion. Pathology results were discussed with the patient by telephone. The patient reported doing well after the biopsies with  tenderness at the sites. Post biopsy instructions and care were reviewed and questions were answered. The patient was encouraged to call [facility] for any additional concerns. The patient was referred to [facility] on July 24, 2018. Pathology results reported by Terie Purser, RN on 07/16/2018. Electronically Signed   By: Dorise Bullion III M.D   On: 07/16/2018 07:34   Result Date: 07/16/2018 CLINICAL DATA:  Biopsy of right breast mass at 5:30. Biopsy of a right breast mass at 10. Biopsy of right axillary lymph node. EXAM: ULTRASOUND GUIDED RIGHT BREAST CORE NEEDLE BIOPSY COMPARISON:  Previous exam(s). FINDINGS: I met with the patient and we discussed the procedure of ultrasound-guided biopsy, including benefits and alternatives. We discussed the high likelihood of a successful procedure. We discussed the risks of the procedure, including infection, bleeding, tissue injury, clip migration, and inadequate sampling. Informed written consent was given. The usual time-out protocol was performed immediately prior to the procedure. Lesion quadrant: Lower-inner Using sterile technique and 1% Lidocaine as local anesthetic, under direct ultrasound visualization, a 12 gauge spring-loaded device was used to perform biopsy of the right breast mass at 5:30 using a lateral approach. At the conclusion of the procedure a tissue marker clip was deployed into the biopsy cavity. Follow up 2 view mammogram was performed and dictated separately. Lesion quadrant: Upper-outer Using  sterile technique and 1% Lidocaine as local anesthetic, under direct ultrasound visualization, a 12 gauge spring-loaded device was used to perform biopsy of the right breast mass at 10 using a lateral approach. At the conclusion of the procedure a tissue marker clip was deployed into the biopsy cavity. Follow up 2 view mammogram was performed and  dictated separately. Lesion quadrant: Right axilla. Using sterile technique and 1% Lidocaine as local anesthetic, under direct ultrasound visualization, a 14 gauge spring-loaded device was used to perform biopsy of an abnormal right axillary lymph node using a lateral approach. At the conclusion of the procedure a HydroMARK tissue marker clip was deployed into the biopsy cavity. Follow up 2 view mammogram was performed and dictated separately. IMPRESSION: Ultrasound guided biopsy of 2 right breast masses and an abnormal right axillary lymph node. No apparent complications. Electronically Signed: By: Dorise Bullion III M.D On: 07/10/2018 15:57   [place] Rt Breast Bx W Loc Dev Ea Add Lesion Img Bx Spec US Guide  Addendum Date: 07/16/2018   ADDENDUM REPORT: 07/16/2018 07:34 ADDENDUM: Pathology revealed HIGH GRADE INVASIVE CARCINOMA of the Right breast, both locations, 5:30 o'clock and 10 o'clock. In the absence of other lesions, the phenotype would be consistent with breast carcinoma. Pathology revealed INVASIVE CARCINOMA of the Right axilla. No residual nodal tissue identified. This was found to be concordant by Dr. Dorise Bullion. Pathology results were discussed with the patient by telephone. The patient reported doing well after the biopsies with tenderness at the sites. Post biopsy instructions and care were reviewed and questions were answered. The patient was encouraged to call [facility] for any additional concerns. The patient was referred to [facility] on July 24, 2018. Pathology results reported by Terie Purser, RN on 07/16/2018. Electronically Signed   By: Dorise Bullion III M.D   On: 07/16/2018 07:34   Result Date: 07/16/2018 CLINICAL DATA:  Biopsy of right breast mass at 5:30. Biopsy of a right breast mass at 10. Biopsy of right axillary lymph node. EXAM: ULTRASOUND GUIDED RIGHT BREAST CORE NEEDLE  BIOPSY COMPARISON:  Previous exam(s). FINDINGS: I met with the patient and we discussed the procedure of ultrasound-guided biopsy, including benefits and alternatives. We discussed the high likelihood of a successful procedure. We discussed the risks of the procedure, including infection, bleeding, tissue injury, clip migration, and inadequate sampling. Informed written consent was given. The usual time-out protocol was performed immediately prior to the procedure. Lesion quadrant: Lower-inner Using sterile technique and 1% Lidocaine as local anesthetic, under direct ultrasound visualization, a 12 gauge spring-loaded device was used to perform biopsy of the right breast mass at 5:30 using a lateral approach. At the conclusion of the procedure a tissue marker clip was deployed into the biopsy cavity. Follow up 2 view mammogram was performed and dictated separately. Lesion quadrant: Upper-outer Using sterile technique and 1% Lidocaine as local anesthetic, under direct ultrasound visualization, a 12 gauge spring-loaded device was used to perform biopsy of the right breast mass at 10 using a lateral approach. At the conclusion of the procedure a tissue marker clip was deployed into the biopsy cavity. Follow up 2 view mammogram was performed and dictated separately. Lesion quadrant: Right axilla. Using sterile technique and 1% Lidocaine as local anesthetic, under direct ultrasound visualization, a 14 gauge spring-loaded device was used to perform biopsy of an abnormal right axillary lymph node using a lateral approach. At the conclusion  of the procedure a HydroMARK tissue marker clip was deployed into the biopsy cavity. Follow up 2 view mammogram was performed and dictated separately. IMPRESSION: Ultrasound guided biopsy of 2 right breast masses and an abnormal right axillary lymph node. No apparent complications. Electronically Signed: By: Dorise Bullion III M.D On: 07/10/2018 15:57     ELIGIBLE FOR AVAILABLE  RESEARCH PROTOCOL: UPBEAT   ASSESSMENT: 58 y.o. [place], [place] woman status post right breast biopsy x2 for multicentric invasive ductal carcinoma, clinically T1c N1, stage IIB, grade 3, functionally triple negative, with an MIB-1 of 70%  (1) neoadjuvant chemotherapy will consist of doxorubicin and cyclophosphamide in dose dense fashion x4 followed by weekly Abraxane and carboplatin x12  (2) definitive surgery to follow  (3) adjuvant radiation as appropriate  (4) genetics testing pending   PLAN: I spent approximately 60 minutes face to face with Zameria with more than 50% of that time spent in counseling and coordination of care. Specifically we reviewed the biology of the patient's diagnosis and the specifics of her situation.  We first reviewed the fact that cancer is not one disease but more than 100 different diseases and that it is important to keep them separate-- otherwise when friends and relatives discuss their own cancer experiences with Jocilyn confusion can result. Similarly we explained that if breast cancer spreads to the bone or liver, the patient would not have bone cancer or liver cancer, but breast cancer in the bone and breast cancer in the liver: one cancer in three places-- not 3 different cancers which otherwise would have to be treated in 3 different ways.  We discussed the difference between local and systemic therapy. In terms of loco-regional treatment, lumpectomy plus radiation is equivalent to mastectomy as far as survival is concerned. For this reason, and because the cosmetic results are generally superior, we recommend breast conserving surgery.  We also noted that in terms of sequencing of treatments, whether systemic therapy or surgery is done first does not affect the ultimate outcome.  This is relevant to events case since it will allow her time to get her genetics results and use them in terms of surgical planning, it would give her information regarding tumor  response to chemotherapy, and it will make the surgery easier.  We then discussed the rationale for systemic therapy. There is some risk that this cancer may have already spread to other parts of her body. Patients frequently ask at this point about bone scans, CAT scans and PET scans to find out if they have occult breast cancer somewhere else. The problem is that in early stage disease we are much more likely to find false positives then true cancers and this would expose the patient to unnecessary procedures as well as unnecessary radiation. Scans cannot answer the question the patient really would like to know, which is whether she has microscopic disease elsewhere in her body. For those reasons we do not recommend them.  Of course we would proceed to aggressive evaluation of any symptoms that might suggest metastatic disease, but that is not the case here.  Next we went over the options for systemic therapy which are anti-estrogens, anti-HER-2 immunotherapy, and chemotherapy. Kimla does not meet criteria for anti-HER-2 immunotherapy. She is also not a good candidate for anti-estrogens: Patients who have weak estrogen positivity and negative progesterone positivity should not rely on antiestrogens as a significant contributor to systemic recurrence risk reduction.  This patient should be an will be treated as a triple  negative case.  Accordingly she will be treated with doxorubicin and cyclophosphamide in dose dense fashion x4 followed by carboplatin and Abraxane x12.  Will use Abraxane instead of paclitaxel because of the patient's diabetes, the point being of course to avoid steroids as much as possible  Discussed the possible toxicities side effects and complications of these agents.  She will come to chemotherapy teaching and learn more about it.  She will also have genetics testing.  She will have an echocardiogram and will have a port placed.  We are hoping to start her chemotherapy  08/07/2018  Erva has a good understanding of the overall plan. She agrees with it. She knows the goal of treatment in her case is cure. She will call with any problems that may develop before her next visit here.    Stoy Fenn, Virgie Dad, MD  07/24/18 4:40 PM Medical Oncology and Hematology [facility] [address] [place], [place] [address] Tel. [phone]    Fax. [phone]   I, Jacqualyn Posey am acting as a Education administrator for Chauncey Cruel, MD.   I, Lurline Del MD, have reviewed the above documentation for accuracy and completeness, and I agree with the above.

## 2018-07-24 ENCOUNTER — Ambulatory Visit: Payer: Managed Care, Other (non HMO) | Attending: Surgery | Admitting: Physical Therapy

## 2018-07-24 ENCOUNTER — Encounter: Payer: Self-pay | Admitting: Oncology

## 2018-07-24 ENCOUNTER — Inpatient Hospital Stay: Payer: Managed Care, Other (non HMO)

## 2018-07-24 ENCOUNTER — Other Ambulatory Visit: Payer: Self-pay

## 2018-07-24 ENCOUNTER — Other Ambulatory Visit: Payer: Self-pay | Admitting: *Deleted

## 2018-07-24 ENCOUNTER — Inpatient Hospital Stay (HOSPITAL_BASED_OUTPATIENT_CLINIC_OR_DEPARTMENT_OTHER): Payer: Managed Care, Other (non HMO) | Admitting: Oncology

## 2018-07-24 ENCOUNTER — Encounter: Payer: Self-pay | Admitting: Family Medicine

## 2018-07-24 ENCOUNTER — Encounter: Payer: Self-pay | Admitting: Physical Therapy

## 2018-07-24 ENCOUNTER — Ambulatory Visit
Admission: RE | Admit: 2018-07-24 | Discharge: 2018-07-24 | Disposition: A | Discharge status: Home / Self Care | Payer: Managed Care, Other (non HMO) | Source: Ambulatory Visit | Attending: Radiation Oncology | Admitting: Radiation Oncology

## 2018-07-24 ENCOUNTER — Other Ambulatory Visit: Payer: Self-pay | Admitting: Surgery

## 2018-07-24 VITALS — BP 181/87 | HR 59 | Temp 98.3°F | Resp 18 | Ht 66.0 in | Wt 198.4 lb

## 2018-07-24 DIAGNOSIS — C50811 Malignant neoplasm of overlapping sites of right female breast: Secondary | ICD-10-CM | POA: Diagnosis present

## 2018-07-24 DIAGNOSIS — E669 Obesity, unspecified: Secondary | ICD-10-CM

## 2018-07-24 DIAGNOSIS — E1165 Type 2 diabetes mellitus with hyperglycemia: Secondary | ICD-10-CM | POA: Diagnosis not present

## 2018-07-24 DIAGNOSIS — Z170 Estrogen receptor positive status [ER+]: Principal | ICD-10-CM

## 2018-07-24 DIAGNOSIS — IMO0001 Reserved for inherently not codable concepts without codable children: Secondary | ICD-10-CM

## 2018-07-24 DIAGNOSIS — E785 Hyperlipidemia, unspecified: Secondary | ICD-10-CM

## 2018-07-24 DIAGNOSIS — R293 Abnormal posture: Secondary | ICD-10-CM | POA: Diagnosis present

## 2018-07-24 DIAGNOSIS — E1169 Type 2 diabetes mellitus with other specified complication: Secondary | ICD-10-CM

## 2018-07-24 LAB — CMP (CANCER CENTER ONLY)
ALT: 15 U/L (ref 0–44)
AST: 12 U/L — ABNORMAL LOW (ref 15–41)
Albumin: 3.9 g/dL (ref 3.5–5.0)
Alkaline Phosphatase: 82 U/L (ref 38–126)
Anion gap: 12 (ref 5–15)
BILIRUBIN TOTAL: 0.5 mg/dL (ref 0.3–1.2)
BUN: 14 mg/dL (ref 6–20)
CO2: 29 mmol/L (ref 22–32)
CREATININE: 1.14 mg/dL — AB (ref 0.44–1.00)
Calcium: 10 mg/dL (ref 8.9–10.3)
Chloride: 96 mmol/L — ABNORMAL LOW (ref 98–111)
GFR, Est AFR Am: >60 mL/min (ref >60)
GFR, Estimated: 54 mL/min — ABNORMAL LOW (ref >60)
Glucose, Bld: 460 mg/dL — ABNORMAL HIGH (ref 70–99)
Potassium: 3.5 mmol/L (ref 3.5–5.1)
Sodium: 137 mmol/L (ref 135–145)
Total Protein: 7.9 g/dL (ref 6.5–8.1)

## 2018-07-24 LAB — CBC WITH DIFFERENTIAL (CANCER CENTER ONLY)
Abs Immature Granulocytes: 0.02 10*3/uL (ref 0.00–0.07)
Basophils Absolute: 0 10*3/uL (ref 0.0–0.1)
Basophils Relative: 1 %
EOS PCT: 2 %
Eosinophils Absolute: 0.1 10*3/uL (ref 0.0–0.5)
HCT: 38.8 % (ref 36.0–46.0)
Hemoglobin: 12.9 g/dL (ref 12.0–15.0)
Immature Granulocytes: 0 %
Lymphocytes Relative: 36 %
Lymphs Abs: 2.3 10*3/uL (ref 0.7–4.0)
MCH: 28.6 pg (ref 26.0–34.0)
MCHC: 33.2 g/dL (ref 30.0–36.0)
MCV: 86 fL (ref 80.0–100.0)
Monocytes Absolute: 0.4 10*3/uL (ref 0.1–1.0)
Monocytes Relative: 6 %
Neutro Abs: 3.6 10*3/uL (ref 1.7–7.7)
Neutrophils Relative %: 55 %
Platelet Count: 342 10*3/uL (ref 150–400)
RBC: 4.51 MIL/uL (ref 3.87–5.11)
RDW: 12.9 % (ref 11.5–15.5)
WBC: 6.5 10*3/uL (ref 4.0–10.5)
nRBC: 0 % (ref 0.0–0.2)

## 2018-07-24 MED ORDER — PROCHLORPERAZINE MALEATE 10 MG PO TABS: 10.0000 mg | ORAL_TABLET | Freq: Four times a day (QID) | ORAL | 1 refills | Status: DC | PRN

## 2018-07-24 MED ORDER — LORAZEPAM 0.5 MG PO TABS: 0.5000 mg | ORAL_TABLET | Freq: Every evening | ORAL | 0 refills | Status: DC | PRN

## 2018-07-24 MED ORDER — ATORVASTATIN CALCIUM 20 MG PO TABS: 20.0000 mg | ORAL_TABLET | Freq: Every day | ORAL | 3 refills | Status: DC

## 2018-07-24 MED ORDER — DAPAGLIFLOZIN PROPANEDIOL 5 MG PO TABS: 5.0000 mg | ORAL_TABLET | Freq: Every day | ORAL | 6 refills | Status: DC

## 2018-07-24 MED ORDER — LIDOCAINE-PRILOCAINE 2.5-2.5 % EX CREA: TOPICAL_CREAM | CUTANEOUS | 3 refills | Status: DC

## 2018-07-24 MED ORDER — DEXAMETHASONE 4 MG PO TABS: ORAL_TABLET | ORAL | 1 refills | Status: DC

## 2018-07-24 NOTE — Progress Notes (Unsigned)
Nutrition Assessment  Reason for Assessment:  Pt seen in Breast Clinic  ASSESSMENT:   58 year old female with new diagnosis of breast cancer.  Past medical history DM, HTN, asthma.    Patient reports normal appetite. Met with a RD when diagnosed with DM  Medications:    Labs: reviewed  Anthropometrics:   Height: 66 inches Weight: 198 lb 6.4oz BMI: 32   NUTRITION DIAGNOSIS: Food and nutrition related knowledge deficit related to new diagnosis of breast cancer as evidenced by no prior need for nutrition related information.  INTERVENTION:   Discussed and provided packet of information regarding nutritional tips for breast cancer patients.  Questions answered.  Teachback method used.  Contact information provided and patient knows to contact me with questions/concerns.    MONITORING, EVALUATION, and GOAL: Pt will consume a healthy plant based diet to maintain lean body mass throughout treatment.   Jacyln Carmer B. Zenia Resides, [place], [place] Registered Dietitian [phone] (pager)

## 2018-07-24 NOTE — Progress Notes (Signed)
START ON PATHWAY REGIMEN - Breast     A cycle is every 14 days (cycles 1-4):     Doxorubicin      Cyclophosphamide      Pegfilgrastim-xxxx    A cycle is every 21 days (cycles 5-8):     Paclitaxel      Carboplatin   **Always confirm dose/schedule in your pharmacy ordering system**  Patient Characteristics: Preoperative or Nonsurgical Candidate (Clinical Staging), Neoadjuvant Therapy followed by Surgery, Invasive Disease, Chemotherapy, HER2 Negative/Unknown/Equivocal, ER Negative/Unknown, Platinum Therapy Indicated Therapeutic Status: Preoperative or Nonsurgical Candidate (Clinical Staging) AJCC M Category: cM0 AJCC Grade: G3 Breast Surgical Plan: Neoadjuvant Therapy followed by Surgery ER Status: Negative (-) AJCC 8 Stage Grouping: IIB HER2 Status: Negative (-) AJCC T Category: cT1c AJCC N Category: cN1 PR Status: Negative (-) Type of Therapy: Platinum Therapy Indicated Intent of Therapy: Curative Intent, Discussed with Patient 

## 2018-07-24 NOTE — Progress Notes (Signed)
Radiation Oncology         ([phone]) [phone] ________________________________  Multidisciplinary Breast Oncology Clinic [facility]) Initial Outpatient Consultation  Name: Autumn Cook MRN: [id number]  Date: 07/24/2018  DOB: 08/04/1960  BC:[id number], Gay Filler, MD  Alphonsa Overall, MD   REFERRING PHYSICIAN: Alphonsa Overall, MD  DIAGNOSIS: The encounter diagnosis was Malignant neoplasm of overlapping sites of right breast in female, estrogen receptor positive ([place]).  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 Malignant neoplasm of overlapping sites of right breast in female, estrogen receptor positive ([place]) C50.811    Z17.0     HISTORY OF PRESENT ILLNESS::Danamarie NICEY KRAH is a 58 y.o. female who is presenting to the office today for evaluation of her newly diagnosed breast cancer. She is accompanied by her daughter. She is doing well overall.   She had routine screening mammography on 07/01/18 showing a possible abnormality in the right breast. She underwent bilateral diagnostic mammography with tomography and right breast ultrasonography at [facility] on 07/05/18 showing: a 1.4 cm mass in the LOWER INNER RIGHT breast, 1 cm mass in the UPPER-OUTER RIGHT breast and markedly enlarged LOWER RIGHT axillary lymph node. The 2 masses within the RIGHT breast may represent masses or abnormal enlarged intraparenchymal lymph nodes.  Biopsy of the right breast on 07/10/18 showed: invasive carcinoma at the 5:30 o'clock position. At the 10 o'clock position, invasive carcinoma was again present. In the right axilla, invasive carcinoma was again present. Prognostic indicators significant for: estrogen receptor, 30% positive and progesterone receptor, 0% negative. Proliferation marker Ki67 at 70%. HER2 negative  Lymph node prognostic indicators significant for: ER 100% positive with weak staining intensity. PR negative. Ki67 at 70%. HER2  negative.   Menarche: 58 years old Age at first live birth: 58 years old GP: GxP4 LMP: 2015 Contraceptive: No HRT: No   The patient was referred today for presentation in the multidisciplinary conference.  Radiology studies and pathology slides were presented there for review and discussion of treatment options.  A consensus was discussed regarding potential next steps.  PREVIOUS RADIATION THERAPY: No  PAST MEDICAL HISTORY:  has a past medical history of Allergy, Arthritis, Asthma, Diabetes mellitus without complication ([place]), Hypertension, and Migraine.    PAST SURGICAL HISTORY:No past surgical history on file.  FAMILY HISTORY: family history includes Cancer (age of onset: 58) in her father; Heart disease in her mother; Hypertension in her father and mother; Kidney failure in her sister; Stroke in her father.  SOCIAL HISTORY:  reports that she has quit smoking. She has a 5.00 pack-year smoking history. She has never used smokeless tobacco. She reports that she does not drink alcohol or use drugs.  ALLERGIES: Metformin and related; Asa [aspirin]; and Penicillins  MEDICATIONS:  Current Outpatient Medications  Medication Sig Dispense Refill   albuterol (PROVENTIL HFA;VENTOLIN HFA) 108 (90 Base) MCG/ACT inhaler Inhale 2 puffs into the lungs every 4 (four) hours as needed for wheezing or shortness of breath (cough, shortness of breath or wheezing.). 1 Inhaler 1   atenolol (TENORMIN) 100 MG tablet TAKE 1 TABLET BY MOUTH EVERY DAY 30 tablet 5   blood glucose meter kit and supplies KIT Dispense based on patient and insurance preference. Use up to four times daily as directed. (FOR ICD-9 250.00, 250.01). 1 each 0   dexamethasone (DECADRON) 4 MG tablet Take 2 tablets by mouth once a day on  the day after chemotherapy and then take 2 tablets two times a day for 2 days. Take with food. 30 tablet 1   glucose blood (ACCU-CHEK ACTIVE STRIPS) test strip Use as instructed- check blood sugar up to 2x  a day 100 each 12   Insulin Glargine (BASAGLAR KWIKPEN) 100 UNIT/ML SOPN INJECT 14 UNITS SUBCUTANEOUSLY DAILY 15 mL 3   Insulin Pen Needle (PEN NEEDLES) 32G X 4 MM MISC 1 each by Does not apply route daily. 100 each 3   Insulin Pen Needle (PEN NEEDLES) 32G X 4 MM MISC 1 each by Does not apply route daily. 100 each 3   lidocaine-prilocaine (EMLA) cream Apply to affected area once 30 g 3   lisinopril-hydrochlorothiazide (ZESTORETIC) 20-12.5 MG tablet Take 2 tablets by mouth daily. 180 tablet 3   loratadine (CLARITIN) 10 MG tablet Take 1 tablet (10 mg total) by mouth daily.     LORazepam (ATIVAN) 0.5 MG tablet Take 1 tablet (0.5 mg total) by mouth at bedtime as needed (Nausea or vomiting). 30 tablet 0   ONE TOUCH ULTRA TEST test strip Check sugar 2 times daily 100 each prn   ONETOUCH DELICA LANCETS 08M MISC Check sugar 2 times daily 100 each prn   prochlorperazine (COMPAZINE) 10 MG tablet Take 1 tablet (10 mg total) by mouth every 6 (six) hours as needed (Nausea or vomiting). 30 tablet 1   Vitamin D, Ergocalciferol, (DRISDOL) 1.25 MG (50000 UT) CAPS capsule TAKE 1 CAPSULE BY MOUTH EVERY 7 DAYS 12 capsule 1   No current facility-administered medications for this encounter.     REVIEW OF SYSTEMS:  REVIEW OF SYSTEMS: A 10+ POINT REVIEW OF SYSTEMS WAS OBTAINED including neurology, dermatology, psychiatry, cardiac, respiratory, lymph, extremities, GI, GU, musculoskeletal, constitutional, reproductive, HEENT.On the provided form, she reports diabetes, night sweats, sleep loss, cramping, wearing glasses, foot swelling, SOB with walking, changes in stool habits, breast lump, back pain, forgetfulness, anxiety, and hot flashes. She denies any other symptoms.    PHYSICAL EXAM: Vitals with BMI 07/24/2018  Height 5' 6"  Weight 198 lbs 6 oz  BMI 57.84  Systolic 696  Diastolic 87  Pulse 59  Respirations 18   Lungs are clear to auscultation bilaterally. Heart has regular rate and rhythm. No  palpable cervical, supraclavicular, or axillary adenopathy. Abdomen soft, non-tender, normal bowel sounds. Breast: Left breast large and pendulous with no palpable mass, nipple discharge, or bleeding. Right breast with some thickening in the lower aspect of the breast, possibly related to her biopsy and bruising. No discreet palpable mass. Thickening in the low axillary area, either biopsy changes or palpable node. No nipple discharge or bleeding.    ECOG = 1  0 - Asymptomatic (Fully active, able to carry on all predisease activities without restriction)  1 - Symptomatic but completely ambulatory (Restricted in physically strenuous activity but ambulatory and able to carry out work of a light or sedentary nature. For example, light housework, office work)  2 - Symptomatic, <50% in bed during the day (Ambulatory and capable of all self care but unable to carry out any work activities. Up and about more than 50% of waking hours)  3 - Symptomatic, >50% in bed, but not bedbound (Capable of only limited self-care, confined to bed or chair 50% or more of waking hours)  4 - Bedbound (Completely disabled. Cannot carry on any self-care. Totally confined to bed or chair)  5 - Death   Eustace Pen MM, Creech RH, Tormey DC, et al. (  1982). "Toxicity and response criteria of the [facility]". [name] Oncol. 5 (6): 649-55  LABORATORY DATA:  Lab Results  Component Value Date   WBC 6.5 07/24/2018   HGB 12.9 07/24/2018   HCT 38.8 07/24/2018   MCV 86.0 07/24/2018   PLT 342 07/24/2018   Lab Results  Component Value Date   NA 137 07/24/2018   K 3.5 07/24/2018   CL 96 (L) 07/24/2018   CO2 29 07/24/2018   Lab Results  Component Value Date   ALT 15 07/24/2018   AST 12 (L) 07/24/2018   ALKPHOS 82 07/24/2018   BILITOT 0.5 07/24/2018    PULMONARY FUNCTION TEST:   Recent Review Flowsheet Data    There is no flowsheet data to display.      RADIOGRAPHY: US Breast Ltd Uni Right  Inc Axilla  Result Date: 07/05/2018 CLINICAL DATA:  58 year old female with RIGHT breast masses identified on screening mammogram. EXAM: ULTRASOUND OF THE RIGHT BREAST COMPARISON:  Previous exam(s). FINDINGS: Targeted ultrasound of the RIGHT breast is performed, showing following: A 1.4 x 1.2 x 1.4 cm oval hypoechoic mass at the 5:30 position 8 cm from the nipple A 0.9 x 1 x 1 cm slightly irregular hypoechoic mass at the 10:30 position 9 cm from the nipple A 3 x 1.9 x 3 cm probable lymph node without fatty hilum in the LOWER RIGHT axilla. A RIGHT axillary lymph node with slightly thickened cortex. IMPRESSION: 1. 1.4 cm mass in the LOWER INNER RIGHT breast, 1 cm mass in the UPPER-OUTER RIGHT breast and markedly enlarged LOWER RIGHT axillary lymph node. The 2 masses within the RIGHT breast may represent masses or abnormal enlarged intraparenchymal lymph nodes. Tissue sampling is recommended. RECOMMENDATION: Ultrasound-guided biopsies of both RIGHT breast masses and markedly enlarged LOWER RIGHT axillary lymph node, to evaluate for malignancy/lymphoma. These procedures will be arranged. I have discussed the findings and recommendations with the patient. Results were also provided in writing at the conclusion of the visit. If applicable, a reminder letter will be sent to the patient regarding the next appointment. BI-RADS CATEGORY  4: Suspicious. Electronically Signed   By: Margarette Canada M.D.   On: 07/05/2018 13:36   Mm 3d Screen Breast Bilateral  Result Date: 07/01/2018 CLINICAL DATA:  Screening. EXAM: DIGITAL SCREENING BILATERAL MAMMOGRAM WITH TOMO AND CAD COMPARISON:  Previous exam(s). ACR Breast Density Category b: There are scattered areas of fibroglandular density. FINDINGS: In the right breast, possible masses warrant further evaluation. In the left breast, no findings suspicious for malignancy. Images were processed with CAD. IMPRESSION: Further evaluation is suggested for possible masses in the right  breast. RECOMMENDATION: Diagnostic mammogram and possibly ultrasound of the right breast. (Code:[id number]) The patient will be contacted regarding the findings, and additional imaging will be scheduled. BI-RADS CATEGORY  0: Incomplete. Need additional imaging evaluation and/or prior mammograms for comparison. Electronically Signed   By: Ammie Ferrier M.D.   On: 07/01/2018 14:03   [place] Axillary Node Core Biopsy Right  Addendum Date: 07/16/2018   ADDENDUM REPORT: 07/16/2018 07:34 ADDENDUM: Pathology revealed HIGH GRADE INVASIVE CARCINOMA of the Right breast, both locations, 5:30 o'clock and 10 o'clock. In the absence of other lesions, the phenotype would be consistent with breast carcinoma. Pathology revealed INVASIVE CARCINOMA of the Right axilla. No residual nodal tissue identified. This was found to be concordant by Dr. Dorise Bullion. Pathology results were discussed with the patient by telephone. The patient reported doing well after the biopsies with  tenderness at the sites. Post biopsy instructions and care were reviewed and questions were answered. The patient was encouraged to call [facility] for any additional concerns. The patient was referred to [facility] on July 24, 2018. Pathology results reported by Terie Purser, RN on 07/16/2018. Electronically Signed   By: Dorise Bullion III M.D   On: 07/16/2018 07:34   Result Date: 07/16/2018 CLINICAL DATA:  Biopsy of right breast mass at 5:30. Biopsy of a right breast mass at 10. Biopsy of right axillary lymph node. EXAM: ULTRASOUND GUIDED RIGHT BREAST CORE NEEDLE BIOPSY COMPARISON:  Previous exam(s). FINDINGS: I met with the patient and we discussed the procedure of ultrasound-guided biopsy, including benefits and alternatives. We discussed the high likelihood of a successful procedure. We discussed the risks of the procedure, including infection,  bleeding, tissue injury, clip migration, and inadequate sampling. Informed written consent was given. The usual time-out protocol was performed immediately prior to the procedure. Lesion quadrant: Lower-inner Using sterile technique and 1% Lidocaine as local anesthetic, under direct ultrasound visualization, a 12 gauge spring-loaded device was used to perform biopsy of the right breast mass at 5:30 using a lateral approach. At the conclusion of the procedure a tissue marker clip was deployed into the biopsy cavity. Follow up 2 view mammogram was performed and dictated separately. Lesion quadrant: Upper-outer Using sterile technique and 1% Lidocaine as local anesthetic, under direct ultrasound visualization, a 12 gauge spring-loaded device was used to perform biopsy of the right breast mass at 10 using a lateral approach. At the conclusion of the procedure a tissue marker clip was deployed into the biopsy cavity. Follow up 2 view mammogram was performed and dictated separately. Lesion quadrant: Right axilla. Using sterile technique and 1% Lidocaine as local anesthetic, under direct ultrasound visualization, a 14 gauge spring-loaded device was used to perform biopsy of an abnormal right axillary lymph node using a lateral approach. At the conclusion of the procedure a HydroMARK tissue marker clip was deployed into the biopsy cavity. Follow up 2 view mammogram was performed and dictated separately. IMPRESSION: Ultrasound guided biopsy of 2 right breast masses and an abnormal right axillary lymph node. No apparent complications. Electronically Signed: By: Dorise Bullion III M.D On: 07/10/2018 15:57   Mm Clip Placement Right  Result Date: 07/10/2018 CLINICAL DATA:  Evaluate for biopsy clip EXAM: DIAGNOSTIC RIGHT MAMMOGRAM POST ULTRASOUND BIOPSY COMPARISON:  Previous exam(s). FINDINGS: Mammographic images were obtained following ultrasound guided biopsy of 2 right breast masses and a right axillary lymph node. The  ribbon shaped clip is within the biopsied 530 right breast mass. The coil shaped clip is within the right-sided 10 o'clock right breast mass. HydroMARK clip is within the abnormal right axillary lymph node. IMPRESSION: Appropriate clip placement as above. Final Assessment: Post Procedure Mammograms for Marker Placement Electronically Signed   By: Dorise Bullion III M.D   On: 07/10/2018 16:24   [place] Rt Breast Bx W Loc Dev 1st Lesion Img Bx Spec US Guide  Addendum Date: 07/16/2018   ADDENDUM REPORT: 07/16/2018 07:34 ADDENDUM: Pathology revealed HIGH GRADE INVASIVE CARCINOMA of the Right breast, both locations, 5:30 o'clock and 10 o'clock. In the absence of other lesions, the phenotype would be consistent with breast carcinoma. Pathology revealed INVASIVE CARCINOMA of the Right axilla. No residual nodal tissue identified. This was found to be concordant by Dr. Dorise Bullion. Pathology results were discussed with the patient  by telephone. The patient reported doing well after the biopsies with tenderness at the sites. Post biopsy instructions and care were reviewed and questions were answered. The patient was encouraged to call [facility] for any additional concerns. The patient was referred to [facility] on July 24, 2018. Pathology results reported by Terie Purser, RN on 07/16/2018. Electronically Signed   By: Dorise Bullion III M.D   On: 07/16/2018 07:34   Result Date: 07/16/2018 CLINICAL DATA:  Biopsy of right breast mass at 5:30. Biopsy of a right breast mass at 10. Biopsy of right axillary lymph node. EXAM: ULTRASOUND GUIDED RIGHT BREAST CORE NEEDLE BIOPSY COMPARISON:  Previous exam(s). FINDINGS: I met with the patient and we discussed the procedure of ultrasound-guided biopsy, including benefits and alternatives. We discussed the high likelihood of a successful procedure. We discussed the risks of the  procedure, including infection, bleeding, tissue injury, clip migration, and inadequate sampling. Informed written consent was given. The usual time-out protocol was performed immediately prior to the procedure. Lesion quadrant: Lower-inner Using sterile technique and 1% Lidocaine as local anesthetic, under direct ultrasound visualization, a 12 gauge spring-loaded device was used to perform biopsy of the right breast mass at 5:30 using a lateral approach. At the conclusion of the procedure a tissue marker clip was deployed into the biopsy cavity. Follow up 2 view mammogram was performed and dictated separately. Lesion quadrant: Upper-outer Using sterile technique and 1% Lidocaine as local anesthetic, under direct ultrasound visualization, a 12 gauge spring-loaded device was used to perform biopsy of the right breast mass at 10 using a lateral approach. At the conclusion of the procedure a tissue marker clip was deployed into the biopsy cavity. Follow up 2 view mammogram was performed and dictated separately. Lesion quadrant: Right axilla. Using sterile technique and 1% Lidocaine as local anesthetic, under direct ultrasound visualization, a 14 gauge spring-loaded device was used to perform biopsy of an abnormal right axillary lymph node using a lateral approach. At the conclusion of the procedure a HydroMARK tissue marker clip was deployed into the biopsy cavity. Follow up 2 view mammogram was performed and dictated separately. IMPRESSION: Ultrasound guided biopsy of 2 right breast masses and an abnormal right axillary lymph node. No apparent complications. Electronically Signed: By: Dorise Bullion III M.D On: 07/10/2018 15:57   [place] Rt Breast Bx W Loc Dev Ea Add Lesion Img Bx Spec US Guide  Addendum Date: 07/16/2018   ADDENDUM REPORT: 07/16/2018 07:34 ADDENDUM: Pathology revealed HIGH GRADE INVASIVE CARCINOMA of the Right breast, both locations, 5:30 o'clock and 10 o'clock. In the absence of other lesions, the  phenotype would be consistent with breast carcinoma. Pathology revealed INVASIVE CARCINOMA of the Right axilla. No residual nodal tissue identified. This was found to be concordant by Dr. Dorise Bullion. Pathology results were discussed with the patient by telephone. The patient reported doing well after the biopsies with tenderness at the sites. Post biopsy instructions and care were reviewed and questions were answered. The patient was encouraged to call [facility] for any additional concerns. The patient was referred to [facility] on July 24, 2018. Pathology results reported by Terie Purser, RN on 07/16/2018. Electronically Signed   By: Dorise Bullion III M.D   On: 07/16/2018 07:34   Result Date: 07/16/2018 CLINICAL DATA:  Biopsy of right breast mass  at 5:30. Biopsy of a right breast mass at 10. Biopsy of right axillary lymph node. EXAM: ULTRASOUND GUIDED RIGHT BREAST CORE NEEDLE BIOPSY COMPARISON:  Previous exam(s). FINDINGS: I met with the patient and we discussed the procedure of ultrasound-guided biopsy, including benefits and alternatives. We discussed the high likelihood of a successful procedure. We discussed the risks of the procedure, including infection, bleeding, tissue injury, clip migration, and inadequate sampling. Informed written consent was given. The usual time-out protocol was performed immediately prior to the procedure. Lesion quadrant: Lower-inner Using sterile technique and 1% Lidocaine as local anesthetic, under direct ultrasound visualization, a 12 gauge spring-loaded device was used to perform biopsy of the right breast mass at 5:30 using a lateral approach. At the conclusion of the procedure a tissue marker clip was deployed into the biopsy cavity. Follow up 2 view mammogram was performed and dictated separately. Lesion quadrant: Upper-outer Using sterile technique and 1%  Lidocaine as local anesthetic, under direct ultrasound visualization, a 12 gauge spring-loaded device was used to perform biopsy of the right breast mass at 10 using a lateral approach. At the conclusion of the procedure a tissue marker clip was deployed into the biopsy cavity. Follow up 2 view mammogram was performed and dictated separately. Lesion quadrant: Right axilla. Using sterile technique and 1% Lidocaine as local anesthetic, under direct ultrasound visualization, a 14 gauge spring-loaded device was used to perform biopsy of an abnormal right axillary lymph node using a lateral approach. At the conclusion of the procedure a HydroMARK tissue marker clip was deployed into the biopsy cavity. Follow up 2 view mammogram was performed and dictated separately. IMPRESSION: Ultrasound guided biopsy of 2 right breast masses and an abnormal right axillary lymph node. No apparent complications. Electronically Signed: By: Dorise Bullion III M.D On: 07/10/2018 15:57      IMPRESSION: Clinical Stage II-B multicentric invasive ductal carcinoma of the right breast. ("triple negative")  Patient would appear to be a potential candidate for breast conservation. She does have multicentric disease but the lesions are relatively small and she would appear to be a candidate for two lumpectomies at the time of her surgery as well as targeted node dissection or axillary node dissection. Additional info will be obtained with the pt's upcoming MRI to evaluate further whether she is a candidate for breast conserving surgery.  If she is not a candidate, we would also recommend radiation as part of post-mastectomy treatment given her positive node. We discussed the general course of radiation, potential side effects, and toxicities with radiation and the patient is interested in this approach if MRI does not show any additional problems.    PLAN:  1. Genetics testing 2. MRI 3. Port 4. Neoadjuvant chemotherapy 5. Surgery to be  determined at a later date 27. XRT 7. AI 8. Echo 9. Chemo class   ------------------------------------------------  Blair Promise, PhD, MD This document serves as a record of services personally performed by Gery Pray, MD. It was created on his behalf by Mary-Margaret Loma Messing, a trained medical scribe. The creation of this record is based on the scribe's personal observations and the provider's statements to them. This document has been checked and approved by the attending provider.

## 2018-07-24 NOTE — Therapy (Signed)
[place], [place], [address] Phone: [phone]   Fax:  [phone]  Physical Therapy Evaluation  Patient Details  Name: Autumn Cook MRN: [id number] Date of Birth: 1961-01-14 Referring Provider (PT): Dr. Alphonsa Overall   Encounter Date: 07/24/2018  PT End of Session - 07/24/18 1059    Visit Number  1    Number of Visits  1    PT Start Time  1287    PT Stop Time  8676   Also saw pt from [phone] for a total of 31 minutes   PT Time Calculation (min)  13 min    Activity Tolerance  Patient tolerated treatment well    Behavior During Therapy  [facility] for tasks assessed/performed       Past Medical History:  Diagnosis Date  . Allergy   . Arthritis   . Asthma   . Diabetes mellitus without complication ([place])   . Hypertension   . Migraine     History reviewed. No pertinent surgical history.  There were no vitals filed for this visit.   Subjective Assessment - 07/24/18 1050    Subjective  Patient reports she is here today to be seen by her medical team for her newly diagnosed right breast cancer.    Patient is accompained by:  Family member    Pertinent History  Patient was diagnosed on 07/01/2018 with right grade III invasive ductal carcinoma breast cancer. There are 2 masses measuring 1.4 cm in the lower inner quadrant and 1 cm in the upper outer quadrant. She has a 3 cm axillary node that is positive. It is ER positive, PR negative, and HER2 negative with a Ki67 of 70%.     Patient Stated Goals  reduce lymphedema risk and learn post op shoulder ROM HEP    Currently in Pain?  No/denies         [facility] PT Assessment - 07/24/18 0001      Assessment   Medical Diagnosis  Right breast cancer    Referring Provider (PT)  Dr. Alphonsa Overall    Onset Date/Surgical Date  07/01/18    Hand Dominance  Left    Prior Therapy  none      Precautions   Precautions  Other (comment)    Precaution Comments  active  cancer      Restrictions   Weight Bearing Restrictions  No      Balance Screen   Has the patient fallen in the past 6 months  No    Has the patient had a decrease in activity level because of a fear of falling?   No    Is the patient reluctant to leave their home because of a fear of falling?   No      Home Environment   Living Environment  Private residence    Living Arrangements  Spouse/significant other    Available Help at Discharge  Family      Prior Function   Level of Independence  Independent    Vocation  Full time employment    Vocation Requirements  real estate agent    Leisure  She does not exercise      Cognition   Overall Cognitive Status  Within Functional Limits for tasks assessed      Posture/Postural Control   Posture/Postural Control  Postural limitations    Postural Limitations  Rounded Shoulders;Forward head      ROM / Strength   AROM /  PROM / Strength  AROM;Strength      AROM   AROM Assessment Site  Shoulder;Cervical    Right/Left Shoulder  Right;Left    Right Shoulder Extension  45 Degrees    Right Shoulder Flexion  143 Degrees    Right Shoulder ABduction  163 Degrees    Right Shoulder Internal Rotation  45 Degrees    Right Shoulder External Rotation  88 Degrees    Left Shoulder Extension  50 Degrees    Left Shoulder Flexion  140 Degrees    Left Shoulder ABduction  150 Degrees    Left Shoulder Internal Rotation  58 Degrees    Left Shoulder External Rotation  90 Degrees    Cervical Flexion  WNL    Cervical Extension  WNL    Cervical - Right Side Bend  WNL    Cervical - Left Side Bend  WNL    Cervical - Right Rotation  WNL    Cervical - Left Rotation  WNL      Strength   Overall Strength  Within functional limits for tasks performed        LYMPHEDEMA/ONCOLOGY QUESTIONNAIRE - 07/24/18 1058      Type   Cancer Type  Right breast cancer      Lymphedema Assessments   Lymphedema Assessments  Upper extremities      Right Upper Extremity  Lymphedema   10 cm Proximal to Olecranon Process  27.9 cm    Olecranon Process  25.2 cm    10 cm Proximal to Ulnar Styloid Process  20.2 cm    Just Proximal to Ulnar Styloid Process  16.5 cm    Across Hand at [employer]  20 cm    At [place] of 2nd Digit  6.5 cm      Left Upper Extremity Lymphedema   10 cm Proximal to Olecranon Process  30 cm    Olecranon Process  26.3 cm    10 cm Proximal to Ulnar Styloid Process  20.3 cm    Just Proximal to Ulnar Styloid Process  16.6 cm    Across Hand at [employer]  20.3 cm    At [place] of 2nd Digit  6.4 cm          Quick Dash - 07/24/18 0001    Open a tight or new jar  Unable    Do heavy household chores (wash walls, wash floors)  Unable    Carry a shopping bag or briefcase  Moderate difficulty    Wash your back  Unable    Use a knife to cut food  Moderate difficulty    Recreational activities in which you take some force or impact through your arm, shoulder, or hand (golf, hammering, tennis)  Unable    During the past week, to what extent has your arm, shoulder or hand problem interfered with your normal social activities with family, friends, neighbors, or groups?  Extremely    During the past week, to what extent has your arm, shoulder or hand problem limited your work or other regular daily activities  Modererately    Arm, shoulder, or hand pain.  Moderate    Tingling (pins and needles) in your arm, shoulder, or hand  None    Difficulty Sleeping  Severe difficulty    DASH Score  70.45 %        Objective measurements completed on examination: See above findings.     Patient was instructed today in a home  exercise program today for post op shoulder range of motion. These included active assist shoulder flexion in sitting, scapular retraction, wall walking with shoulder abduction, and hands behind head external rotation.  She was encouraged to do these twice a day, holding 3 seconds and repeating 5 times when permitted by her  physician.             PT Education - 07/24/18 1058    Education Details  Lymphedema risk reduction and post op shoulder ROM HEP    Person(s) Educated  Patient;Child(ren)    Methods  Explanation;Demonstration;Handout    Comprehension  Returned demonstration;Verbalized understanding           Breast Clinic Goals - 07/24/18 1104      Patient will be able to verbalize understanding of pertinent lymphedema risk reduction practices relevant to her diagnosis specifically related to skin care.   Time  1    Period  Days    Status  Achieved      Patient will be able to return demonstrate and/or verbalize understanding of the post-op home exercise program related to regaining shoulder range of motion.   Time  1    Period  Days    Status  Achieved      Patient will be able to verbalize understanding of the importance of attending the postoperative After Breast Cancer Class for further lymphedema risk reduction education and therapeutic exercise.   Time  1    Period  Days    Status  Achieved            Plan - 07/24/18 1101    Clinical Impression Statement  Patient was diagnosed on 07/01/2018 with right grade III invasive ductal carcinoma breast cancer. There are 2 masses measuring 1.4 cm in the lower inner quadrant and 1 cm in the upper outer quadrant. She has a 3 cm axillary node that is positive. It is ER positive, PR negative, and HER2 negative with a Ki67 of 70%. Her multidisciplinary medical team met prior to her assessments to determine a recommended treatment plan. She is planning to have neoadjuvant chemotherapy followed by a double lumpectomy and targeted node dissection, radiation, and anti-estrogen therapy. She will benefit from a post op PT visit to reassess and determine needs.    Stability/Clinical Decision Making  Stable/Uncomplicated    Clinical Decision Making  Low    Rehab Potential  Excellent    PT Frequency  One time visit    PT Treatment/Interventions   ADLs/Self Care Home Management;Patient/family education;Therapeutic exercise    PT Next Visit Plan  Will reassess 3-4 weeks post op to determine needs if MD refers    PT Home Exercise Plan  Post op shoulder ROM HEP    Consulted and Agree with Plan of Care  Patient;Family member/caregiver    Family Member Consulted  daughter       Patient will benefit from skilled therapeutic intervention in order to improve the following deficits and impairments:  Impaired UE functional use, Decreased range of motion, Decreased knowledge of precautions, Pain, Postural dysfunction  Visit Diagnosis: Malignant neoplasm of overlapping sites of right breast in female, estrogen receptor positive ([place]) - Plan: PT plan of care cert/re-cert  Abnormal posture - Plan: PT plan of care cert/re-cert   Patient will follow up at outpatient cancer rehab 3-4 weeks following surgery.  If the patient requires physical therapy at that time, a specific plan will be dictated and sent to the referring physician for approval. The  patient was educated today on appropriate basic range of motion exercises to begin post operatively and the importance of attending the After Breast Cancer class following surgery.  Patient was educated today on lymphedema risk reduction practices as it pertains to recommendations that will benefit the patient immediately following surgery.  She verbalized good understanding.     Problem List Patient Active Problem List   Diagnosis Date Noted  . Malignant neoplasm of overlapping sites of right breast in female, estrogen receptor positive ([place]) 07/23/2018  . Uncontrolled diabetes mellitus type 2 without complications ([place]) 79/11/9308  . Achilles tendonitis, bilateral 06/11/2015  . Chest pain with moderate risk for cardiac etiology 10/13/2014  . Hypercholesterolemia 10/13/2014  . Idiopathic scoliosis 08/18/2014  . Hypertension 09/11/2012  . Overweight (BMI 25.0-29.9) 09/11/2012  . Migraine, unspecified,  without mention of intractable migraine without mention of status migrainosus 09/11/2012  . Hx of thyroid nodule 09/11/2012  . History of tobacco use 09/11/2012   Annia Friendly, PT 07/24/18 11:07 AM  [place], [place], [address] Phone: [phone]   Fax:  [phone]  Name: Autumn Cook MRN: [id number] Date of Birth: 25-Aug-1960

## 2018-07-24 NOTE — Patient Instructions (Signed)

## 2018-07-25 ENCOUNTER — Other Ambulatory Visit: Payer: Self-pay | Admitting: Oncology

## 2018-07-25 DIAGNOSIS — Z170 Estrogen receptor positive status [ER+]: Principal | ICD-10-CM

## 2018-07-25 DIAGNOSIS — C50811 Malignant neoplasm of overlapping sites of right female breast: Secondary | ICD-10-CM

## 2018-07-25 NOTE — Pre-Procedure Instructions (Addendum)
[place]  07/25/2018      [id number] - [place], [address] [place] [address] Phone: [phone] Fax: [phone]    Your procedure is scheduled on Thursday March 26th.  Report to [facility] Admitting at 5:30 A.M.  Call this number if you have problems the morning of surgery:  [phone]   Remember:  Do not eat or drink after midnight.    Take these medicines the morning of surgery with A SIP OF WATER  atenolol (TENORMIN) loratadine (CLARITIN)  albuterol (PROVENTIL HFA;VENTOLIN HFA) if needed *bring with you to the hospital* prochlorperazine (COMPAZINE) if needed  7 days prior to surgery STOP taking any Aspirin(unless otherwise instructed by your surgeon), Aleve, Naproxen, Ibuprofen, Motrin, Advil, Goody's, BC's, all herbal medications, fish oil, and all vitamins   HOW TO MANAGE YOUR DIABETES BEFORE AND AFTER SURGERY  Why is it important to control my blood sugar before and after surgery? . Improving blood sugar levels before and after surgery helps healing and can limit problems. . A way of improving blood sugar control is eating a healthy diet by: o  Eating less sugar and carbohydrates o  Increasing activity/exercise o  Talking with your doctor about reaching your blood sugar goals . High blood sugars (greater than 180 mg/dL) can raise your risk of infections and slow your recovery, so you will need to focus on controlling your diabetes during the weeks before surgery. . Make sure that the doctor who takes care of your diabetes knows about your planned surgery including the date and location.  How do I manage my blood sugar before surgery? . Check your blood sugar at least 4 times a day, starting 2 days before surgery, to make sure that the level is not too high or low. o Check your blood sugar the morning of your surgery when you wake up and every 2 hours until you get to the Short Stay unit. . If your blood  sugar is less than 70 mg/dL, you will need to treat for low blood sugar: o Do not take insulin. o Treat a low blood sugar (less than 70 mg/dL) with  cup of clear juice (cranberry or apple), 4 glucose tablets, OR glucose gel. Recheck blood sugar in 15 minutes after treatment (to make sure it is greater than 70 mg/dL). If your blood sugar is not greater than 70 mg/dL on recheck, call [phone] o  for further instructions. . Report your blood sugar to the short stay nurse when you get to Short Stay.  . If you are admitted to the hospital after surgery: o Your blood sugar will be checked by the staff and you will probably be given insulin after surgery (instead of oral diabetes medicines) to make sure you have good blood sugar levels. o The goal for blood sugar control after surgery is 80-180 mg/dL.     WHAT DO I DO ABOUT MY DIABETES MEDICATION?   Marland Kitchen Do not take oral diabetes medicines (pills): dapagliflozin propanediol (FARXIGA)  the morning of surgery.  . THE DAY BEFORE SURGERY, do NOT take dapagliflozin propanediol (FARXIGA)       . THE MORNING OF SURGERY, take 8 units of Insulin Glargine [facility]).  . The day of surgery, do not take other diabetes injectables, including Byetta (exenatide), Bydureon (exenatide ER), Victoza (liraglutide), or Trulicity (dulaglutide).      Do not wear jewelry, make-up or nail polish.  Do not wear lotions,  powders, or perfumes, or deodorant.  Do not shave 48 hours prior to surgery.  Men may shave face and neck.  Do not bring valuables to the hospital.  [facility] is not responsible for any belongings or valuables.  Contacts, dentures or bridgework may not be worn into surgery.  Leave your suitcase in the car.  After surgery it may be brought to your room.  For patients admitted to the hospital, discharge time will be determined by your treatment team.  Patients discharged the day of surgery will not be allowed to drive home.   [facility]- Preparing For Surgery  Before surgery, you can play an important role. Because skin is not sterile, your skin needs to be as free of germs as possible. You can reduce the number of germs on your skin by washing with CHG (chlorahexidine gluconate) Soap before surgery.  CHG is an antiseptic cleaner which kills germs and bonds with the skin to continue killing germs even after washing.    Oral Hygiene is also important to reduce your risk of infection.  Remember - BRUSH YOUR TEETH THE MORNING OF SURGERY WITH YOUR REGULAR TOOTHPASTE  Please do not use if you have an allergy to CHG or antibacterial soaps. If your skin becomes reddened/irritated stop using the CHG.  Do not shave (including legs and underarms) for at least 48 hours prior to first CHG shower. It is OK to shave your face.  Please follow these instructions carefully.   1. Shower the NIGHT BEFORE SURGERY and the MORNING OF SURGERY with CHG.   2. If you chose to wash your hair, wash your hair first as usual with your normal shampoo.  3. After you shampoo, rinse your hair and body thoroughly to remove the shampoo.  4. Use CHG as you would any other liquid soap. You can apply CHG directly to the skin and wash gently with a scrungie or a clean washcloth.   5. Apply the CHG Soap to your body ONLY FROM THE NECK DOWN.  Do not use on open wounds or open sores. Avoid contact with your eyes, ears, mouth and genitals (private parts). Wash Face and genitals (private parts)  with your normal soap.  6. Wash thoroughly, paying special attention to the area where your surgery will be performed.  7. Thoroughly rinse your body with warm water from the neck down.  8. DO NOT shower/wash with your normal soap after using and rinsing off the CHG Soap.  9. Pat yourself dry with a CLEAN TOWEL.  10. Wear CLEAN PAJAMAS to bed the night before surgery, wear comfortable clothes the morning of surgery  11. Place CLEAN SHEETS on your bed the night of  your first shower and DO NOT SLEEP WITH PETS.    Day of Surgery: Shower as stated above. Do not apply any deodorants/lotions.  Please wear clean clothes to the hospital/surgery center.   Remember to brush your teeth WITH YOUR REGULAR TOOTHPASTE.   Please read over the following fact sheets that you were given.

## 2018-07-26 ENCOUNTER — Other Ambulatory Visit: Payer: Self-pay

## 2018-07-26 ENCOUNTER — Encounter (HOSPITAL_COMMUNITY)
Admission: RE | Admit: 2018-07-26 | Discharge: 2018-07-26 | Disposition: A | Discharge status: Home / Self Care | Payer: Managed Care, Other (non HMO) | Source: Ambulatory Visit | Attending: Surgery | Admitting: Surgery

## 2018-07-26 ENCOUNTER — Encounter (HOSPITAL_COMMUNITY): Payer: Self-pay

## 2018-07-26 DIAGNOSIS — I10 Essential (primary) hypertension: Secondary | ICD-10-CM | POA: Diagnosis not present

## 2018-07-26 DIAGNOSIS — Z01818 Encounter for other preprocedural examination: Secondary | ICD-10-CM | POA: Diagnosis present

## 2018-07-26 HISTORY — DX: Malignant (primary) neoplasm, unspecified: C80.1

## 2018-07-26 HISTORY — DX: Other specified postprocedural states: Z98.890

## 2018-07-26 HISTORY — DX: Nausea with vomiting, unspecified: R11.2

## 2018-07-26 HISTORY — DX: Family history of other specified conditions: Z84.89

## 2018-07-26 LAB — GLUCOSE, CAPILLARY: Glucose-Capillary: 349 mg/dL — ABNORMAL HIGH (ref 70–99)

## 2018-07-26 NOTE — Progress Notes (Addendum)
PCP - Dr. Edilia Bo Cardiologist - denies  Chest x-ray - N/A EKG - 07/26/18 Stress Test - "may have had in [place] over 10 years ago but not sure" ECHO - 10/20/14 Cardiac Cath - denies  Sleep Study - denies   Fasting Blood Sugar - typically under 200- has been off metformin for 1 month due to causing diarrhea; recently prescribed Farxiga- has not started yet. Picked up rx today. CBG today 349; patient states has been in the 400's. Spoke with Lelan Pons DM coordinator regarding patient- may need DM consult DOS.  Checks Blood Sugar once daily.  Patient instructed to hold all Aspirin, NSAID's, herbal medications, fish oil and vitamins 7 days prior to surgery.  Anesthesia review: review labs and EKG  Patient denies shortness of breath, fever, cough and chest pain at PAT appointment   Patient verbalized understanding of instructions that were given to them at the PAT appointment. Patient was also instructed that they will need to review over the PAT instructions again at home before surgery.

## 2018-07-26 NOTE — Anesthesia Preprocedure Evaluation (Addendum)
Anesthesia Evaluation  Patient identified by MRN, date of birth, ID band Patient awake    Reviewed: Allergy & Precautions, NPO status , Patient's Chart, lab work & pertinent test results  History of Anesthesia Complications (+) PONV  Airway Mallampati: II  TM Distance: >3 FB     Dental   Pulmonary asthma , former smoker (quit 1990),    breath sounds clear to auscultation       Cardiovascular hypertension, Pt. on medications and Pt. on home beta blockers  Rhythm:Regular Rate:Normal  07/31/2018 ECHO: EF 60-65%, valves OK   Neuro/Psych    GI/Hepatic negative GI ROS, Neg liver ROS,   Endo/Other  diabetes, Oral Hypoglycemic Agents, Insulin Dependentobesity  Renal/GU negative Renal ROS     Musculoskeletal   Abdominal   Peds  Hematology   Anesthesia Other Findings Breast cancer  Reproductive/Obstetrics                          Anesthesia Physical Anesthesia Plan  ASA: III  Anesthesia Plan: General   Post-op Pain Management:    Induction: Intravenous  PONV Risk Score and Plan: 4 or greater and Scopolamine patch - Pre-op, Dexamethasone and Ondansetron  Airway Management Planned: LMA  Additional Equipment:   Intra-op Plan:   Post-operative Plan: Extubation in OR  Informed Consent: I have reviewed the patients History and Physical, chart, labs and discussed the procedure including the risks, benefits and alternatives for the proposed anesthesia with the patient or authorized representative who has indicated his/her understanding and acceptance.     Dental advisory given  Plan Discussed with: Anesthesiologist and CRNA  Anesthesia Plan Comments: (Recently uncontrolled IDDMII. She stopped her metformin due to diarrhea. Per PCP Dr. Lillie Fragmin notes she is not always compliant with insulin. Per pt, her long acting insulin recently titrated up from 14u to 16u and Dr. Lorelei Pont started her on  Farxiga (as of her PAT appt the pt has not picked this up yet, though it appears to have been prescribed a month ago). Her CBG was 349 at PAT. Pt instructed to take all medications as prescribed. She was also instructed to call Dr. Lorelei Pont today to discuss recent CBG numbers and see if any adjustments are recommended prior to surgery. Dr. Lucia Gaskins notified of pt's poor BG control.  PAT EKG shows anterolateral T-wave inversions. She has had similar anterior inversions present to a varying degree since at least 2015, although the lateral inversions on the current tracing appear more prominent. 10/20/14 she had a normal myocardial perfusion study. Low risk study. LV EF was hyperdynamic >65%. There was no ST segment deviation noted during stress. Discussed with Dr. Smith Robert, he advised that as long as her repeat echo on 07/31/18 does not show any new WMA, ok to proceed. Echo results reviewed, EF 60-65%, no RWMAs.)   Anesthesia Quick Evaluation

## 2018-07-29 ENCOUNTER — Telehealth: Payer: Self-pay | Admitting: *Deleted

## 2018-07-29 ENCOUNTER — Encounter (HOSPITAL_COMMUNITY): Payer: Self-pay

## 2018-07-29 ENCOUNTER — Other Ambulatory Visit: Payer: Self-pay

## 2018-07-29 ENCOUNTER — Encounter (HOSPITAL_BASED_OUTPATIENT_CLINIC_OR_DEPARTMENT_OTHER): Payer: Self-pay | Admitting: *Deleted

## 2018-07-29 ENCOUNTER — Encounter: Payer: Self-pay | Admitting: *Deleted

## 2018-07-29 ENCOUNTER — Ambulatory Visit (HOSPITAL_COMMUNITY)
Admission: RE | Admit: 2018-07-29 | Discharge: 2018-07-29 | Disposition: A | Discharge status: Home / Self Care | Payer: Managed Care, Other (non HMO) | Source: Ambulatory Visit | Attending: Oncology | Admitting: Oncology

## 2018-07-29 ENCOUNTER — Encounter: Payer: Self-pay | Admitting: Licensed Clinical Social Worker

## 2018-07-29 ENCOUNTER — Inpatient Hospital Stay: Payer: Managed Care, Other (non HMO)

## 2018-07-29 ENCOUNTER — Inpatient Hospital Stay (HOSPITAL_BASED_OUTPATIENT_CLINIC_OR_DEPARTMENT_OTHER): Payer: Managed Care, Other (non HMO) | Admitting: Licensed Clinical Social Worker

## 2018-07-29 DIAGNOSIS — Z170 Estrogen receptor positive status [ER+]: Secondary | ICD-10-CM | POA: Diagnosis not present

## 2018-07-29 DIAGNOSIS — Z801 Family history of malignant neoplasm of trachea, bronchus and lung: Secondary | ICD-10-CM | POA: Insufficient documentation

## 2018-07-29 DIAGNOSIS — Z87891 Personal history of nicotine dependence: Secondary | ICD-10-CM | POA: Diagnosis not present

## 2018-07-29 DIAGNOSIS — C50811 Malignant neoplasm of overlapping sites of right female breast: Secondary | ICD-10-CM | POA: Diagnosis not present

## 2018-07-29 MED ORDER — GADOBUTROL 1 MMOL/ML IV SOLN: 10.0000 mL | Freq: Once | INTRAVENOUS | Status: DC | PRN

## 2018-07-29 NOTE — Progress Notes (Signed)
[place] Psychosocial Distress Screening Clinical Social Work  Clinical Social Work was referred by distress screening protocol.  The patient scored a 4 on the Psychosocial Distress Thermometer which indicates mild distress. Clinical Social Worker contacted patient at home to assess for distress and other psychosocial needs, and follow up after [facility].  CSW let patient a message offering support and information on the [facility] support team and support services.  CSW encouraged patient to call back with any questions or concerns.     ONCBCN DISTRESS SCREENING 07/29/2018  Screening Type Initial Screening  Distress experienced in past week (1-10) 4    Johnnye Lana, MSW, LCSW, OSW-C Clinical Social Worker [place] [phone]

## 2018-07-29 NOTE — Telephone Encounter (Signed)
Spoke to pt concerning [place] from 3.18.20. Denies questions or concerns regarding dx or treatment care plan. Discussed that was unable to tolerate breast MRI d/t severe back pain. Informed pt that I would discuss with [name] and let her know his decision on assessing response to chemo. Encourage pt to reach out with needs. Received verbal understanding

## 2018-07-29 NOTE — Progress Notes (Signed)
REFERRING PROVIDER: Chauncey Cruel, MD [address] [place],  [address]  PRIMARY PROVIDER:  Darreld Mclean, MD  PRIMARY REASON FOR VISIT:  1. Malignant neoplasm of overlapping sites of right breast in female, estrogen receptor positive ([place])   2. Family history of lung cancer      HISTORY OF PRESENT ILLNESS:   Autumn Cook, a 58 y.o. female, was seen for a [place] cancer genetics consultation at the request of [name] due to a personal history of cancer.  Autumn Cook presents to clinic today to discuss the possibility of a hereditary predisposition to cancer, genetic testing, and to further clarify her future cancer risks, as well as potential cancer risks for family members.   In 2020, at the age of 25, Autumn Cook was diagnosed with invasive ductal carcinoma of the right breast. 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   CANCER HISTORY:    Malignant neoplasm of overlapping sites of right breast in female, estrogen receptor positive ([place])   07/23/2018 Initial Diagnosis    Malignant neoplasm of overlapping sites of right breast in female, estrogen receptor positive ([place])    08/07/2018 -  Chemotherapy    The patient had DOXOrubicin (ADRIAMYCIN) chemo injection 124 mg, 60 mg/m2 = 124 mg, Intravenous,  Once, 0 of 4 cycles palonosetron (ALOXI) injection 0.25 mg, 0.25 mg, Intravenous,  Once, 0 of 16 cycles PACLitaxel-protein bound (ABRAXANE) chemo infusion 200 mg, 100 mg/m2 = 200 mg (100 % of original dose 100 mg/m2), Intravenous,  Once, 0 of 12 cycles Dose modification: 100 mg/m2 (original dose 100 mg/m2, Cycle 5) pegfilgrastim-cbqv (UDENYCA) injection 6 mg, 6 mg, Subcutaneous, Once, 0 of 4 cycles CARBOplatin (PARAPLATIN) 210 mg in sodium chloride 0.9 % 100 mL chemo infusion, 210 mg (original dose ), Intravenous,  Once, 0 of 12 cycles Dose modification:    (Cycle 5) cyclophosphamide (CYTOXAN) 1,240 mg in sodium chloride 0.9 % 250 mL chemo infusion, 600 mg/m2 = 1,240 mg, Intravenous,  Once, 0 of 4 cycles fosaprepitant (EMEND) 150 mg, dexamethasone (DECADRON) 12 mg in sodium chloride 0.9 % 145 mL IVPB, , Intravenous,  Once, 0 of 4 cycles  for chemotherapy treatment.       RISK FACTORS:  Menarche was at age 40 First live birth at age 70.  OCP use for approximately 0 years.  Ovaries intact: yes.  Hysterectomy: no.  Menopausal status: postmenopausal.  HRT use: 0 years. Colonoscopy: yes; normal. Mammogram within the last year: yes. Any excessive radiation exposure in the past: no  Past Medical History:  Diagnosis Date  . Allergy   . Arthritis   . Asthma   . Cancer [facility])    R breast cancer  . Diabetes mellitus without complication ([place])   . Family history of adverse reaction to anesthesia    "entire family takes a long time to wake up- N&V for several days"  . Family history of lung cancer   . Hypertension   . Migraine   . PONV (postoperative nausea and vomiting)     Past Surgical History:  Procedure Laterality Date  . BREAST CYST EXCISION  1994    Social History   Socioeconomic History  . Marital status: Married    Spouse name: Not on file  . Number of children: Not on file  . Years of education: Not on file  . Highest education level: Not on file  Occupational History  . Occupation:  real estate agent    Employer: [employer]  Social Needs  . Financial resource strain: Not on file  . Food insecurity:    Worry: Not on file    Inability: Not on file  . Transportation needs:    Medical: Not on file    Non-medical: Not on file  Tobacco Use  . Smoking status: Former Smoker    Packs/day: 1.00    Years: 5.00    Pack years: 5.00    Last attempt to quit: 05/08/1988    Years since quitting: 30.2  . Smokeless tobacco: Never Used  Substance and Sexual Activity  . Alcohol use: No  . Drug use: No  . Sexual  activity: Not on file  Lifestyle  . Physical activity:    Days per week: Not on file    Minutes per session: Not on file  . Stress: Not on file  Relationships  . Social connections:    Talks on phone: Not on file    Gets together: Not on file    Attends religious service: Not on file    Active member of club or organization: Not on file    Attends meetings of clubs or organizations: Not on file    Relationship status: Not on file  Other Topics Concern  . Not on file  Social History Narrative   married     FAMILY HISTORY:  We obtained a detailed, 4-generation family history.  Significant diagnoses are listed below: Family History  Problem Relation Age of Onset  . Hypertension Father   . Cancer Father 57       lung cancer  . Stroke Father   . Heart disease Mother        arrhythmia  . Hypertension Mother   . Kidney failure Sister    Autumn Cook has 4 children: a daughter age 30, a daughter age 38, a son age 20 and a son age 74. No cancers for her children. She has 3 brothers and 2 sisters, No cancers for any of her siblings or nieces/nephews.  Autumn Cook mother is living at 31. Autumn Cook has 1 maternal uncle, 2 maternal aunts, no cancers. No cancers in her maternal cousins. She has no information about her maternal grandfather. Her maternal grandmother died at 67 from pneumonia. Autumn Cook also reports that her grandmother's cousins's son had pancreatic cancer.  Autumn Cook father died at 43 of lung cancer, he was a heavy smoker. Autumn Cook has 1 maternal uncle who passed away from cancer, unknown type, and a maternal aunt who she has no information about. She has limited information about paternal cousins but does not believe there is any cancer. She has no information about her paternal grandparents.   Autumn Cook is unaware of previous family history of genetic testing for hereditary cancer risks. There is no reported Ashkenazi Jewish ancestry. There is no known  consanguinity.  GENETIC COUNSELING ASSESSMENT: Autumn Cook is a 58 y.o. female with a personal history which is somewhat suggestive of a hereditary predisposition to cancer. We, therefore, discussed and recommended the following at today's visit.   DISCUSSION: We discussed that 5-10% of breast cancer is hereditary, with most cases associated with BRCA1/BRCA2.  There are other genes that can be associated with hereditary breast cancer syndromes.  These include PALB2, ATM, CHEK2, etc.   We reviewed the characteristics, features and inheritance patterns of hereditary cancer syndromes. We also discussed genetic testing, including the appropriate family members to test,  the process of testing, insurance coverage and turn-around-time for results. We discussed the implications of a negative, positive and/or variant of uncertain significant result. We recommended Autumn Cook pursue genetic testing for the [facility] Breast and GYN Cancers gene panel.    The Breast and GYN Cancers Panel offered by [facility] includes sequencing and/or deletion duplication testing of the following 23 genes:  ATM, BARD1, BRCA1, BRCA2, BRIP1, CDH1, CHEK2, DICER1, EPCAM, MLH1, MSH2, MSH6, NBN, NF1, PALB2, PMS2, PTEN, RAD50, RAD51C, RAD51D, SMARCA4, STK11, TP53.  Based on Autumn Cook's personal history of triple negative cancer, she meets medical criteria for genetic testing. Despite that she meets criteria, she may still have an out of pocket cost. The lab will notify her of an OOP if any.  PLAN: After considering the risks, benefits, and limitations, Autumn Cook provided informed consent to pursue genetic testing and the blood sample was sent to [facility] for analysis of the Breast and GYN Cancers Panel. Results should be available within approximately 2-3 weeks' time, at which point they will be disclosed by telephone to Autumn Cook, as will any additional recommendations warranted by these results. Autumn Cook will receive a  summary of her genetic counseling visit and a copy of her results once available. This information will also be available in Epic.   Lastly, we encouraged Autumn Cook to remain in contact with cancer genetics annually so that we can continuously update the family history and inform her of any changes in cancer genetics and testing that may be of benefit for this family.   Autumn Cook. H[place] questions were answered to her satisfaction today. Our contact information was provided should additional questions or concerns arise. Thank you for the referral and allowing [place] to share in the care of your patient.   Faith Rogue, Autumn Cook Genetic Counselor [email or web address] Phone: [phone]  The patient was seen for a total of 25 minutes in face-to-face genetic counseling.  Drs. Magrinat, Lindi Adie and/or Burr Medico were available for discussion regarding this case.

## 2018-07-29 NOTE — Progress Notes (Signed)
Patient's chart and lab reviewed with anesthesia Dr Kerin Perna, pt GLU was 460 on 07-24-18 and Cr  1.14. Pt states that her PCP Dr Edilia Bo placed  her on Farxiga 5mg  and Inc her Basaglar insulin to 16units. She started this on 07-26-18. Patient was encouraged to try to get Glu under control and not to miss any doses of her meds. Per Dr Kerin Perna, we will recheck CBG DOS.

## 2018-07-29 NOTE — Progress Notes (Signed)
Pt unable to complete MRI of the breast with and without. Pt is unable to lay on her stomach for more than about 5 mins without having to sit up. Pt in too much pain. Pt states too much pressure on her lower back and sternum. Pt will contrcst MD office and see about options for other imaging or pain meds and attempt a second time.

## 2018-07-30 MED ORDER — GLIPIZIDE 5 MG PO TABS: ORAL_TABLET | ORAL | 3 refills | Status: DC

## 2018-07-30 NOTE — Addendum Note (Signed)
Addended by: Lamar Blinks C on: 07/30/2018 12:43 PM   Modules accepted: Orders

## 2018-07-31 ENCOUNTER — Inpatient Hospital Stay: Payer: Managed Care, Other (non HMO)

## 2018-07-31 ENCOUNTER — Other Ambulatory Visit: Payer: Self-pay | Admitting: Oncology

## 2018-07-31 ENCOUNTER — Other Ambulatory Visit: Payer: Self-pay

## 2018-07-31 ENCOUNTER — Ambulatory Visit (HOSPITAL_COMMUNITY)
Admission: RE | Admit: 2018-07-31 | Discharge: 2018-07-31 | Disposition: A | Discharge status: Home / Self Care | Payer: Managed Care, Other (non HMO) | Source: Ambulatory Visit | Attending: Oncology | Admitting: Oncology

## 2018-07-31 DIAGNOSIS — I10 Essential (primary) hypertension: Secondary | ICD-10-CM | POA: Insufficient documentation

## 2018-07-31 DIAGNOSIS — Z170 Estrogen receptor positive status [ER+]: Secondary | ICD-10-CM | POA: Diagnosis not present

## 2018-07-31 DIAGNOSIS — E785 Hyperlipidemia, unspecified: Secondary | ICD-10-CM | POA: Diagnosis not present

## 2018-07-31 DIAGNOSIS — E119 Type 2 diabetes mellitus without complications: Secondary | ICD-10-CM | POA: Insufficient documentation

## 2018-07-31 DIAGNOSIS — C50811 Malignant neoplasm of overlapping sites of right female breast: Secondary | ICD-10-CM | POA: Insufficient documentation

## 2018-07-31 DIAGNOSIS — Z87891 Personal history of nicotine dependence: Secondary | ICD-10-CM | POA: Diagnosis not present

## 2018-07-31 NOTE — H&P (Signed)
Autumn Cook  Location: [facility] Surgery Patient #: [id number] DOB: 12-15-1960 Undefined / Language: Undefined / Race: Black or African American Female  History of Present Illness   The patient is a 58 year old female who presents with a complaint of breast cancer.  The PCP is Dr. Janett Billow Copland  The patient was referred by  The pateint is at the [facility] - Oncology is Drs. Magrinat and Kin[place] She comes with Morey Hummingbird, daughter.  her last mammogram was November 2018. For about a month she felt some tenderness and maybe a mass in her right breast. This prompted her to update her mammograms. Her last period was about 3 years ago. She has no family history of breast cancer.  [facility]: [facility] - she had a mammogram on 07/05/2018. The mammogram showed a 1.4 x 1.4 cm hypoechoic mass at 5:30, 8 cm from the nipple, a 1.0 x 1.0 cm irregular hypoechoic mass 10:30 position 9 cm from the nipple, and 3.0 x 3.0 cm right axillary lymph node. Biopsy: right breast biopsy at 5:30 o'clock and 10 o'clock, on 07/10/2018 ([id number]) - shows 1) 5:30 o'clock - IDC, 2) 10 o'clock - IDC, 3) right axillary node - POSITIVE Family history of breast or ovarian cancer: On hormone therapy:  I discussed the options for breast cancer treatment with the patient. The patient is at the [facility], which includes medical oncology and radiation oncology. I discussed the surgical options of lumpectomy vs. mastectomy. If mastectomy, there is the possibility of reconstruction. I discussed the options of lymph node biopsy. The treatment plan depends on the pathologic staging of the tumor and the patient's personal wishes. The risks of surgery include, but are not limited to, bleeding, infection, the need for further surgery, and nerve injury. The patient has been given literature on the treatment of breast cancer.  Power port placement - I  discussed the indications and potential complications of the power port placement. The primary complications of the power port, include, but are not limited to, bleeding, infection, nerve injury, thrombosis, and pneumothorax.  Plan: 1) MRI, 2) Power port placement, 3) Genetics, 4) Neoadjuvant chemotx (treat like triple negative), 5) Final surgery dependent on response of tumor  Past Medical History:  1. DM x 3 year 2. Hyperlipidemia 3. HTN x 10 years 4. Migraine headaches - well controlled over the last year 5. Asthma - uses an inhaler infrequently 6. Last colonoscopy about 10 years ago  Social History: Married She comes with Morey Hummingbird, daughter She has 4 daughters: [name] 34 yo, [name] 40 yo, [name] 56 yo, and [name] 56 yo She works as a Insurance claims handler   Medication History (Tawni Pummel, RN; 07/24/2018 7:39 AM) Medications Reconciled   Physical Exam  General: WN AA F who isalert and generally healthy appearing. Skin: Inspection and palpation of the skin unremarkable.  Eyes: Conjunctivae white, pupils equal. Face, ears, nose, mouth, and throat: Face - normal. Normal ears and nose. Lips and teeth normal.  Neck: Supple. No mass. Trachea midline. No thyroid mass.  Lymph Nodes: No supraclavicular or cervical adenopathy. No axillary adenopathy.  Lungs: Normal respiratory effort. Clear to auscultation and symmetric breath sounds. Cardiovascular: Regular rate and rythm. Normal auscultation of the heart. No murmur or rub.  Breasts: right - Large breast, with bruise in right axilla (I can feel node) and bruise in upper outer quadrant and lower outer quadrant Left - Large breast [note: she may be a candidate for  combined lumpectomy/reduction]  Abdomen: Soft. No mass. Liver and spleen not palpable. No tenderness. No hernia. Normal bowel sounds. No abdominal scars. Rectal: Not done.  Musculoskeletal/extremities: Normal  gait. Good strength and ROM in upper and lower extremities.   Neurologic: Grossly intact to motor and sensory function.   Psychiatric: Has normal mood and affect. Judgement and insight appear normal.    Assessment & Plan  1.  BREAST CANCER, STAGE 2, RIGHT (C50.911)  Story: Biopsy: right breast biopsy at 5:30 o'clock and 10 o'clock, on 07/10/2018 ([id number]) - shows 1) 5:30 o'clock - IDC, 2) 10 o'clock - IDC, 3) right axillary node - POSITIVE  Oncology - Magrinat and [place]:   1) MRI,   2) Power port placement,   3) Genetics,  4) Neoadjuvant chemotx (treat like triple negative),  5) Final surgery dependent on response of tumor  2. DM for at least 3 years  Poorly controlled  HbgA1C - 7.9 on 07/01/2018  Glucose - 349 on 07/26/2018 - she has been in touch with Dr. Lorelei Pont and is adjusting her insulin 3. Hyperlipidemia 4. HTN x 10 years 5. Migraine headaches - well controlled over the last year 6. Asthma - uses an inhaler infrequently   Alphonsa Overall, MD, [facility] Surgery Pager: [phone] Office phone:  [phone]

## 2018-07-31 NOTE — Progress Notes (Signed)
  Echocardiogram 2D Echocardiogram has been performed.  Autumn Cook 07/31/2018, 10:47 AM

## 2018-07-31 NOTE — Progress Notes (Signed)
Gatorade G2 given with instructions to complete by [place], pt verbalized understanding.

## 2018-08-01 ENCOUNTER — Ambulatory Visit (HOSPITAL_BASED_OUTPATIENT_CLINIC_OR_DEPARTMENT_OTHER)
Admission: RE | Admit: 2018-08-01 | Discharge: 2018-08-01 | Disposition: A | Discharge status: Home / Self Care | Payer: Managed Care, Other (non HMO) | Attending: Surgery | Admitting: Surgery

## 2018-08-01 ENCOUNTER — Ambulatory Visit (HOSPITAL_BASED_OUTPATIENT_CLINIC_OR_DEPARTMENT_OTHER): Payer: Managed Care, Other (non HMO) | Admitting: Anesthesiology

## 2018-08-01 ENCOUNTER — Encounter: Payer: Self-pay | Admitting: Oncology

## 2018-08-01 ENCOUNTER — Ambulatory Visit (HOSPITAL_BASED_OUTPATIENT_CLINIC_OR_DEPARTMENT_OTHER): Payer: Managed Care, Other (non HMO) | Admitting: Physician Assistant

## 2018-08-01 ENCOUNTER — Ambulatory Visit (HOSPITAL_COMMUNITY): Payer: Managed Care, Other (non HMO)

## 2018-08-01 ENCOUNTER — Encounter (HOSPITAL_BASED_OUTPATIENT_CLINIC_OR_DEPARTMENT_OTHER)
Admission: RE | Disposition: A | Discharge status: Home / Self Care | Payer: Self-pay | Source: Home / Self Care | Attending: Surgery

## 2018-08-01 ENCOUNTER — Other Ambulatory Visit: Payer: Self-pay

## 2018-08-01 ENCOUNTER — Encounter (HOSPITAL_BASED_OUTPATIENT_CLINIC_OR_DEPARTMENT_OTHER): Payer: Self-pay

## 2018-08-01 DIAGNOSIS — Z87891 Personal history of nicotine dependence: Secondary | ICD-10-CM | POA: Insufficient documentation

## 2018-08-01 DIAGNOSIS — Z794 Long term (current) use of insulin: Secondary | ICD-10-CM | POA: Insufficient documentation

## 2018-08-01 DIAGNOSIS — I10 Essential (primary) hypertension: Secondary | ICD-10-CM | POA: Diagnosis not present

## 2018-08-01 DIAGNOSIS — E785 Hyperlipidemia, unspecified: Secondary | ICD-10-CM | POA: Diagnosis not present

## 2018-08-01 DIAGNOSIS — Z8041 Family history of malignant neoplasm of ovary: Secondary | ICD-10-CM | POA: Diagnosis not present

## 2018-08-01 DIAGNOSIS — E669 Obesity, unspecified: Secondary | ICD-10-CM | POA: Diagnosis not present

## 2018-08-01 DIAGNOSIS — E119 Type 2 diabetes mellitus without complications: Secondary | ICD-10-CM | POA: Insufficient documentation

## 2018-08-01 DIAGNOSIS — J45909 Unspecified asthma, uncomplicated: Secondary | ICD-10-CM | POA: Diagnosis not present

## 2018-08-01 DIAGNOSIS — Z95828 Presence of other vascular implants and grafts: Secondary | ICD-10-CM

## 2018-08-01 DIAGNOSIS — C50311 Malignant neoplasm of lower-inner quadrant of right female breast: Secondary | ICD-10-CM | POA: Insufficient documentation

## 2018-08-01 DIAGNOSIS — G43909 Migraine, unspecified, not intractable, without status migrainosus: Secondary | ICD-10-CM | POA: Insufficient documentation

## 2018-08-01 DIAGNOSIS — C50411 Malignant neoplasm of upper-outer quadrant of right female breast: Secondary | ICD-10-CM | POA: Diagnosis not present

## 2018-08-01 DIAGNOSIS — Z6831 Body mass index (BMI) 31.0-31.9, adult: Secondary | ICD-10-CM | POA: Insufficient documentation

## 2018-08-01 DIAGNOSIS — Z419 Encounter for procedure for purposes other than remedying health state, unspecified: Secondary | ICD-10-CM

## 2018-08-01 HISTORY — PX: PORTACATH PLACEMENT: SHX2246

## 2018-08-01 LAB — GLUCOSE, CAPILLARY
Glucose-Capillary: 146 mg/dL — ABNORMAL HIGH (ref 70–99)
Glucose-Capillary: 166 mg/dL — ABNORMAL HIGH (ref 70–99)

## 2018-08-01 SURGERY — INSERTION, TUNNELED CENTRAL VENOUS DEVICE, WITH PORT
Anesthesia: General | Site: Chest | Laterality: Right

## 2018-08-01 MED ORDER — KETOROLAC TROMETHAMINE 30 MG/ML IJ SOLN
INTRAMUSCULAR | Status: AC
  Filled 2018-08-01: qty 1

## 2018-08-01 MED ORDER — ONDANSETRON HCL 4 MG/2ML IJ SOLN
INTRAMUSCULAR | Status: DC | PRN
  Administered 2018-08-01: 4 mg via INTRAVENOUS

## 2018-08-01 MED ORDER — ONDANSETRON HCL 4 MG/2ML IJ SOLN
INTRAMUSCULAR | Status: AC
  Filled 2018-08-01: qty 2

## 2018-08-01 MED ORDER — DEXAMETHASONE SODIUM PHOSPHATE 10 MG/ML IJ SOLN
INTRAMUSCULAR | Status: DC | PRN
  Administered 2018-08-01: 4 mg via INTRAVENOUS

## 2018-08-01 MED ORDER — PHENYLEPHRINE 40 MCG/ML (10ML) SYRINGE FOR IV PUSH (FOR BLOOD PRESSURE SUPPORT)
PREFILLED_SYRINGE | INTRAVENOUS | Status: AC
  Filled 2018-08-01: qty 10

## 2018-08-01 MED ORDER — PHENYLEPHRINE 40 MCG/ML (10ML) SYRINGE FOR IV PUSH (FOR BLOOD PRESSURE SUPPORT)
PREFILLED_SYRINGE | INTRAVENOUS | Status: DC | PRN
  Administered 2018-08-01: 120 ug via INTRAVENOUS
  Administered 2018-08-01 (×2): 80 ug via INTRAVENOUS

## 2018-08-01 MED ORDER — DEXAMETHASONE SODIUM PHOSPHATE 10 MG/ML IJ SOLN
INTRAMUSCULAR | Status: AC
  Filled 2018-08-01: qty 1

## 2018-08-01 MED ORDER — HEPARIN SOD (PORK) LOCK FLUSH 100 UNIT/ML IV SOLN
INTRAVENOUS | Status: DC | PRN
  Administered 2018-08-01: 500 [IU]

## 2018-08-01 MED ORDER — MIDAZOLAM HCL 2 MG/2ML IJ SOLN
1.0000 mg | INTRAMUSCULAR | Status: DC | PRN
  Administered 2018-08-01: 2 mg via INTRAVENOUS

## 2018-08-01 MED ORDER — CEFAZOLIN SODIUM-DEXTROSE 2-4 GM/100ML-% IV SOLN
INTRAVENOUS | Status: AC
  Filled 2018-08-01: qty 100

## 2018-08-01 MED ORDER — PROPOFOL 10 MG/ML IV BOLUS
INTRAVENOUS | Status: AC
  Filled 2018-08-01: qty 40

## 2018-08-01 MED ORDER — OXYCODONE HCL 5 MG PO TABS
5.0000 mg | ORAL_TABLET | Freq: Once | ORAL | Status: AC
  Administered 2018-08-01: 5 mg via ORAL

## 2018-08-01 MED ORDER — ACETAMINOPHEN 500 MG PO TABS
ORAL_TABLET | ORAL | Status: AC
  Filled 2018-08-01: qty 2

## 2018-08-01 MED ORDER — LIDOCAINE 2% (20 MG/ML) 5 ML SYRINGE
INTRAMUSCULAR | Status: AC
  Filled 2018-08-01: qty 5

## 2018-08-01 MED ORDER — GABAPENTIN 300 MG PO CAPS
ORAL_CAPSULE | ORAL | Status: AC
  Filled 2018-08-01: qty 1

## 2018-08-01 MED ORDER — KETOROLAC TROMETHAMINE 30 MG/ML IJ SOLN
INTRAMUSCULAR | Status: DC | PRN
  Administered 2018-08-01: 30 mg via INTRAVENOUS

## 2018-08-01 MED ORDER — HYDROCODONE-ACETAMINOPHEN 5-325 MG PO TABS: 1.0000 | ORAL_TABLET | Freq: Four times a day (QID) | ORAL | 0 refills | Status: DC | PRN

## 2018-08-01 MED ORDER — CHLORHEXIDINE GLUCONATE CLOTH 2 % EX PADS: 6.0000 | MEDICATED_PAD | Freq: Once | CUTANEOUS | Status: DC

## 2018-08-01 MED ORDER — HEPARIN (PORCINE) IN NACL 2-0.9 UNITS/ML
INTRAMUSCULAR | Status: AC | PRN
  Administered 2018-08-01: 1

## 2018-08-01 MED ORDER — BUPIVACAINE-EPINEPHRINE 0.25% -1:200000 IJ SOLN
INTRAMUSCULAR | Status: DC | PRN
  Administered 2018-08-01: 10 mL

## 2018-08-01 MED ORDER — ACETAMINOPHEN 500 MG PO TABS
1000.0000 mg | ORAL_TABLET | ORAL | Status: AC
  Administered 2018-08-01: 1000 mg via ORAL

## 2018-08-01 MED ORDER — HEPARIN SOD (PORK) LOCK FLUSH 100 UNIT/ML IV SOLN
INTRAVENOUS | Status: AC
  Filled 2018-08-01: qty 5

## 2018-08-01 MED ORDER — OXYCODONE HCL 5 MG PO TABS
ORAL_TABLET | ORAL | Status: AC
  Filled 2018-08-01: qty 1

## 2018-08-01 MED ORDER — LACTATED RINGERS IV SOLN
INTRAVENOUS | Status: DC
  Administered 2018-08-01: 07:00:00 via INTRAVENOUS

## 2018-08-01 MED ORDER — GABAPENTIN 300 MG PO CAPS
300.0000 mg | ORAL_CAPSULE | ORAL | Status: AC
  Administered 2018-08-01: 300 mg via ORAL

## 2018-08-01 MED ORDER — SCOPOLAMINE 1 MG/3DAYS TD PT72: 1.0000 | MEDICATED_PATCH | Freq: Once | TRANSDERMAL | Status: DC

## 2018-08-01 MED ORDER — FENTANYL CITRATE (PF) 100 MCG/2ML IJ SOLN: 25.0000 ug | INTRAMUSCULAR | Status: DC | PRN

## 2018-08-01 MED ORDER — FENTANYL CITRATE (PF) 100 MCG/2ML IJ SOLN
50.0000 ug | INTRAMUSCULAR | Status: DC | PRN
  Administered 2018-08-01: 25 ug via INTRAVENOUS

## 2018-08-01 MED ORDER — EPHEDRINE 5 MG/ML INJ
INTRAVENOUS | Status: AC
  Filled 2018-08-01: qty 10

## 2018-08-01 MED ORDER — MIDAZOLAM HCL 2 MG/2ML IJ SOLN
INTRAMUSCULAR | Status: AC
  Filled 2018-08-01: qty 2

## 2018-08-01 MED ORDER — CEFAZOLIN SODIUM-DEXTROSE 2-4 GM/100ML-% IV SOLN
2.0000 g | INTRAVENOUS | Status: AC
  Administered 2018-08-01: 2 g via INTRAVENOUS

## 2018-08-01 MED ORDER — HEPARIN (PORCINE) IN NACL 1000-0.9 UT/500ML-% IV SOLN
INTRAVENOUS | Status: AC
  Filled 2018-08-01: qty 500

## 2018-08-01 MED ORDER — SCOPOLAMINE 1 MG/3DAYS TD PT72: 1.0000 | MEDICATED_PATCH | Freq: Once | TRANSDERMAL | Status: DC | PRN

## 2018-08-01 MED ORDER — LIDOCAINE 2% (20 MG/ML) 5 ML SYRINGE
INTRAMUSCULAR | Status: DC | PRN
  Administered 2018-08-01: 80 mg via INTRAVENOUS

## 2018-08-01 MED ORDER — BUPIVACAINE-EPINEPHRINE 0.25% -1:200000 IJ SOLN
INTRAMUSCULAR | Status: AC
  Filled 2018-08-01: qty 1

## 2018-08-01 MED ORDER — PROPOFOL 10 MG/ML IV BOLUS
INTRAVENOUS | Status: DC | PRN
  Administered 2018-08-01: 200 mg via INTRAVENOUS

## 2018-08-01 MED ORDER — FENTANYL CITRATE (PF) 100 MCG/2ML IJ SOLN
INTRAMUSCULAR | Status: AC
  Filled 2018-08-01: qty 2

## 2018-08-01 SURGICAL SUPPLY — 56 items
ADH SKN CLS APL DERMABOND .7 (GAUZE/BANDAGES/DRESSINGS) ×1
APL PRP STRL LF DISP 70% ISPRP (MISCELLANEOUS) ×1
APL SKNCLS STERI-STRIP NONHPOA (GAUZE/BANDAGES/DRESSINGS)
BAG DECANTER FOR FLEXI CONT (MISCELLANEOUS) ×2 IMPLANT
BENZOIN TINCTURE PRP APPL 2/3 (GAUZE/BANDAGES/DRESSINGS) ×1 IMPLANT
BLADE SURG 15 STRL LF DISP TIS (BLADE) ×1 IMPLANT
BLADE SURG 15 STRL SS (BLADE) ×2
CHLORAPREP W/TINT 26 (MISCELLANEOUS) ×2 IMPLANT
CLEANER CAUTERY TIP 5X5 PAD (MISCELLANEOUS) ×1 IMPLANT
COVER BACK TABLE REUSABLE LG (DRAPES) ×2 IMPLANT
COVER MAYO STAND REUSABLE (DRAPES) ×2 IMPLANT
COVER PROBE 5X48 (MISCELLANEOUS) ×2
COVER WAND RF STERILE (DRAPES) IMPLANT
DECANTER SPIKE VIAL GLASS SM (MISCELLANEOUS) IMPLANT
DERMABOND ADVANCED (GAUZE/BANDAGES/DRESSINGS) ×1
DERMABOND ADVANCED .7 DNX12 (GAUZE/BANDAGES/DRESSINGS) ×1 IMPLANT
DRAPE C-ARM 42X72 X-RAY (DRAPES) ×2 IMPLANT
DRAPE LAPAROTOMY T 102X78X121 (DRAPES) ×2 IMPLANT
DRAPE UTILITY XL STRL (DRAPES) ×3 IMPLANT
ELECT REM PT RETURN 9FT ADLT (ELECTROSURGICAL) ×2
ELECTRODE REM PT RTRN 9FT ADLT (ELECTROSURGICAL) ×1 IMPLANT
GAUZE SPONGE 4X4 12PLY STRL (GAUZE/BANDAGES/DRESSINGS) IMPLANT
GAUZE SPONGE 4X4 12PLY STRL LF (GAUZE/BANDAGES/DRESSINGS) ×4 IMPLANT
GLOVE BIOGEL PI IND STRL 6.5 (GLOVE) IMPLANT
GLOVE BIOGEL PI INDICATOR 6.5 (GLOVE) ×1
GLOVE EXAM NITRILE MD LF STRL (GLOVE) ×1 IMPLANT
GLOVE SURG SIGNA 7.5 PF LTX (GLOVE) ×2 IMPLANT
GLOVE SURG SS PI 6.5 STRL IVOR (GLOVE) ×1 IMPLANT
GOWN STRL REUS W/ TWL LRG LVL3 (GOWN DISPOSABLE) ×1 IMPLANT
GOWN STRL REUS W/ TWL XL LVL3 (GOWN DISPOSABLE) ×1 IMPLANT
GOWN STRL REUS W/TWL LRG LVL3 (GOWN DISPOSABLE) ×2
GOWN STRL REUS W/TWL XL LVL3 (GOWN DISPOSABLE) ×2
IV CATH AUTO 14GX1.75 SAFE ORG (IV SOLUTION) IMPLANT
IV CATH PLACEMENT UNIT 16 GA (IV SOLUTION) IMPLANT
IV CONNECTOR ONE LINK NDLESS (IV SETS) ×1 IMPLANT
IV KIT MINILOC 20X1 SAFETY (NEEDLE) IMPLANT
KIT CVR 48X5XPRB PLUP LF (MISCELLANEOUS) IMPLANT
KIT PORT POWER 8FR ISP CVUE (Port) ×1 IMPLANT
NDL HYPO 25X1 1.5 SAFETY (NEEDLE) ×1 IMPLANT
NEEDLE HYPO 25X1 1.5 SAFETY (NEEDLE) ×2 IMPLANT
PACK BASIN DAY SURGERY FS (CUSTOM PROCEDURE TRAY) ×2 IMPLANT
PAD CLEANER CAUTERY TIP 5X5 (MISCELLANEOUS) ×1
PENCIL BUTTON HOLSTER BLD 10FT (ELECTRODE) ×3 IMPLANT
SET SHEATH INTRODUCER 10FR (MISCELLANEOUS) IMPLANT
SHEATH COOK PEEL AWAY SET 9F (SHEATH) IMPLANT
SLEEVE SCD COMPRESS KNEE MED (MISCELLANEOUS) ×2 IMPLANT
STAPLER VISISTAT 35W (STAPLE) ×1 IMPLANT
STRIP CLOSURE SKIN 1/4X4 (GAUZE/BANDAGES/DRESSINGS) ×1 IMPLANT
SUT ETHILON 2 0 FS 18 (SUTURE) IMPLANT
SUT ETHILON 3 0 PS 1 (SUTURE) IMPLANT
SUT MNCRL AB 4-0 PS2 18 (SUTURE) ×2 IMPLANT
SUT SILK 2 0 SH (SUTURE) ×1 IMPLANT
SUT VICRYL 3-0 CR8 SH (SUTURE) ×2 IMPLANT
SYR 5ML LUER SLIP (SYRINGE) ×2 IMPLANT
SYR CONTROL 10ML LL (SYRINGE) ×2 IMPLANT
TOWEL GREEN STERILE FF (TOWEL DISPOSABLE) ×2 IMPLANT

## 2018-08-01 NOTE — Anesthesia Postprocedure Evaluation (Signed)
Anesthesia Post Note  Patient: Autumn Cook  Procedure(s) Performed: INSERTION PORT-A-CATH WITH ULTRASOUND (Right Chest)     Patient location during evaluation: PACU Anesthesia Type: General Level of consciousness: awake Pain management: pain level controlled Vital Signs Assessment: post-procedure vital signs reviewed and stable Respiratory status: spontaneous breathing Cardiovascular status: stable Postop Assessment: no apparent nausea or vomiting Anesthetic complications: no    Last Vitals:  Vitals:   08/01/18 1000 08/01/18 1004  BP:    Pulse: 64 64  Resp: 11 14  Temp:  36.4 C  SpO2: 100% 100%    Last Pain:  Vitals:   08/01/18 1004  TempSrc: Oral  PainSc:                  [name]

## 2018-08-01 NOTE — Progress Notes (Signed)
Called pt to introduce myself as her Arboriculturist and to discuss copay assistance for [employer].  Attempted to leave a msg but her mailbox was full so I will plan to see her on 08/02/18.

## 2018-08-01 NOTE — Interval H&P Note (Signed)
History and Physical Interval Note:  08/01/2018 7:18 AM  Autumn Cook  has presented today for surgery, with the diagnosis of right breast cancer.  The various methods of treatment have been discussed with the patient and family.   Daughter [name] is with her.  After consideration of risks, benefits and other options for treatment, the patient has consented to  Procedure(s): INSERTION PORT-A-CATH WITH ULTRASOUND (N/A) as a surgical intervention.  The patient's history has been reviewed, patient examined, no change in status, stable for surgery.  I have reviewed the patient's chart and labs.  Questions were answered to the patient's satisfaction.     Shann Medal

## 2018-08-01 NOTE — Progress Notes (Signed)
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  Since her last visit, she attempted to undergo bilateral breast MRI on 07/29/2018, but she was unable to complete the exam due to lower back and sternum pain.  We will be using the breast ultrasound from 07/05/2018 as our baseline and repeat that prior to definitive surgery.  Recall this showed 2 masses in the right breast, both biopsy positive, and a positive lymph node.  She also underwent genetics testing on 07/29/2018, with results currently pending.  She underwent echocardiogram on 07/31/2018, with results showing an ejection fraction of 60-65%.  She underwent port-a-cath insertion yesterday, 08/01/2018.  She is here today to discuss supportive care and to set up her appointments for the upcoming treatments, with specific attention to her history of poorly controlled diabetes.  REVIEW OF SYSTEMS: Autumn Cook tolerated her port placement with minimal side effects.  She does not like the hydrocodone.  It causes her to feel woozy and a little nauseated.  Aside from this a detailed review of systems today was entirely benign  HISTORY OF CURRENT ILLNESS: From the original intake note:  Autumn Cook  had routine screening mammography on 07/01/2018 showing a possible abnormality in the right breast; she notes that did feel some pain and a lump in her breast leading up to the routine screening. She underwent right breast ultrasonography at [facility] on 07/05/2018 showing: A 1.4 x 1.2 x 1.4 cm oval hypoechoic mass at the 5:30 position 8 cm from the nipple. A 0.9 x 1 x 1 cm slightly irregular hypoechoic mass at the 10:30 position 9 cm from the nipple. A 3 x 1.9 x 3 cm probable lymph node without fatty hilum in the LOWER RIGHT axilla. A RIGHT axillary lymph node with slightly thickened cortex.  Accordingly on 07/10/2018 she proceeded to biopsy of the right breast area in question. The pathology from this procedure showed ([id number]): invasive ductal carcinoma, high grade, 5:30 o'clock. Prognostic indicators significant for: estrogen receptor, 30% positive with weak staining intensity and progesterone receptor, 0% negative. Proliferation marker Ki67 at 70%.   An additional biopsy was performed on the same day ([id number]) showing:  2. Breast, right, needle core biopsy, 10 o'clock - Invasive carcinoma  Finally, an additional biopsy was performed at the right axilla on the same day ([id number]) showing: invasive carcinoma, right axilla. Prognostic indicators significant for: estrogen receptor, 70% positive, with weak staining intensity and progesterone receptor, 0% negative. Proliferation marker Ki67 at 70%. HER2 positive by immunohistochemistry.    The patient's subsequent history is as detailed below.    PAST MEDICAL HISTORY: Past Medical History:  Diagnosis Date   Allergy    Arthritis    Asthma    Cancer ([place])    R breast cancer   Diabetes mellitus without complication ([facility])    uncontrolled   Family history  of adverse reaction to anesthesia    "entire family takes a long time to wake up- N&V for several days"   Family history of lung cancer    Hypertension    Migraine     PONV (postoperative nausea and vomiting)      PAST SURGICAL HISTORY: Past Surgical History:  Procedure Laterality Date   BREAST CYST EXCISION  1994   DILATION AND CURETTAGE OF UTERUS     PORTACATH PLACEMENT Right 08/01/2018   Procedure: INSERTION PORT-A-CATH WITH ULTRASOUND;  Surgeon: Alphonsa Overall, MD;  Location: [place];  Service: General;  Laterality: Right;     FAMILY HISTORY: Family History  Problem Relation Age of Onset   Hypertension Father    Cancer Father 20       lung cancer   Stroke Father    Heart disease Mother        arrhythmia   Hypertension Mother    Kidney failure Sister    Laikyn's father died from lung cancer at age 54; he was a heavy smoker. Patients' mother is 30 as of 07/2018. The patient has 3 brothers and 2 sisters. Patient denies anyone in her family having breast, ovarian, prostate, or pancreatic cancer. Laronica's maternal great great uncle was diagnosed with lung cancer.     GYNECOLOGIC HISTORY:  Patient's last menstrual period was 11/14/2013. 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 Her husband, [name], is a Administrator. At home with them is two toy poodles. Cordella has 4 children, [name], Eppie Gibson, and [name]. [name] is 31, lives in [place], and works in Science writer. Autumn Cook is 47, lives in [place], and works in Engineer, technical sales. Autumn Cook is 73, lives in [place], [place], and works as a Control and instrumentation engineer. [name] is 38, lives in [place], and is a Buyer, retail.   ADVANCED DIRECTIVES: In the absence of any documentation, Autumn Cook's spouse, [name], is her healthcare power of attorney.     HEALTH MAINTENANCE: Social History   Tobacco Use   Smoking status: Former Smoker    Packs/day: 1.00    Years: 5.00    Pack years: 5.00    Last attempt to quit:  05/08/1988    Years since quitting: 30.2   Smokeless tobacco: Never Used  Substance Use Topics   Alcohol use: No   Drug use: No    Colonoscopy: yes, 2010 - due 2020.  PAP: 2017  Bone density: yes, 2015, normal Mammography: 07/01/2018   Allergies  Allergen Reactions   Penicillins Hives    Did it involve swelling of the face/tongue/throat, SOB, or low BP? Yes--syncope episode Did it involve sudden or severe rash/hives, skin peeling, or any reaction on the inside of your mouth or nose? No Did you need to seek medical attention at a hospital or doctor's office? Yes When did it last happen? 37-38 YEARS AGO If all above answers are "NO", may proceed with cephalosporin use.    Metformin And Related Diarrhea   Adhesive [Tape] Other (See Comments)    "severe bruising"  ALSO SKIN GLUE   Asa [Aspirin] Other (See Comments)    GI upset     Current Outpatient Medications  Medication Sig Dispense Refill   albuterol (PROVENTIL HFA;VENTOLIN HFA) 108 (90 Base) MCG/ACT inhaler Inhale 2 puffs into the lungs every 4 (four) hours  as needed for wheezing or shortness of breath (cough, shortness of breath or wheezing.). 1 Inhaler 1   atenolol (TENORMIN) 100 MG tablet TAKE 1 TABLET BY MOUTH EVERY DAY (Patient taking differently: Take 100 mg by mouth every evening. ) 30 tablet 5   atorvastatin (LIPITOR) 20 MG tablet Take 1 tablet (20 mg total) by mouth daily. 90 tablet 3   blood glucose meter kit and supplies KIT Dispense based on patient and insurance preference. Use up to four times daily as directed. (FOR ICD-9 250.00, 250.01). 1 each 0   dapagliflozin propanediol (FARXIGA) 5 MG TABS tablet Take 5 mg by mouth daily. 30 tablet 6   dexamethasone (DECADRON) 4 MG tablet Take 2 tablets by mouth once a day on the day after chemotherapy and then take 2 tablets two times a day for 2 days. Take with food. 30 tablet 1   glipiZIDE (GLUCOTROL) 5 MG tablet Start with 5 mg once a day, then  increase to twice a day after 1 week.  Continue to increase as directed by MD 120 tablet 3   glucose blood (ACCU-CHEK ACTIVE STRIPS) test strip Use as instructed- check blood sugar up to 2x a day 100 each 12   HYDROcodone-acetaminophen (NORCO/VICODIN) 5-325 MG tablet Take 1 tablet by mouth every 6 (six) hours as needed for moderate pain. 10 tablet 0   Insulin Glargine (BASAGLAR KWIKPEN) 100 UNIT/ML SOPN INJECT 14 UNITS SUBCUTANEOUSLY DAILY (Patient taking differently: Inject 16 Units into the skin daily. ) 15 mL 3   Insulin Pen Needle (PEN NEEDLES) 32G X 4 MM MISC 1 each by Does not apply route daily. 100 each 3   Insulin Pen Needle (PEN NEEDLES) 32G X 4 MM MISC 1 each by Does not apply route daily. 100 each 3   lidocaine-prilocaine (EMLA) cream Apply to affected area once 30 g 3   lisinopril-hydrochlorothiazide (ZESTORETIC) 20-12.5 MG tablet Take 2 tablets by mouth daily. (Patient taking differently: Take 2 tablets by mouth every evening. ) 180 tablet 3   loratadine (CLARITIN) 10 MG tablet Take 1 tablet (10 mg total) by mouth daily.     LORazepam (ATIVAN) 0.5 MG tablet Take 1 tablet (0.5 mg total) by mouth at bedtime as needed (Nausea or vomiting). 30 tablet 0   ONE TOUCH ULTRA TEST test strip Check sugar 2 times daily 100 each prn   ONETOUCH DELICA LANCETS 09N MISC Check sugar 2 times daily 100 each prn   prochlorperazine (COMPAZINE) 10 MG tablet Take 1 tablet (10 mg total) by mouth every 6 (six) hours as needed (Nausea or vomiting). 30 tablet 1   Vitamin D, Ergocalciferol, (DRISDOL) 1.25 MG (50000 UT) CAPS capsule TAKE 1 CAPSULE BY MOUTH EVERY 7 DAYS (Patient taking differently: Take 50,000 Units by mouth every Saturday. ) 12 capsule 1   No current facility-administered medications for this visit.      OBJECTIVE: Middle-aged African-American woman who appears well  Vitals:   08/02/18 1459  BP: (!) 157/92  Pulse: 63  Resp: 20  Temp: 98.3 F (36.8 C)  SpO2: 100%     Body  mass index is 31.72 kg/m.   Wt Readings from Last 3 Encounters:  08/02/18 196 lb 8 oz (89.1 kg)  08/01/18 195 lb 1.7 oz (88.5 kg)  07/26/18 195 lb 14.4 oz (88.9 kg)      ECOG FS:1 - Symptomatic but completely ambulatory  Sclerae unicteric, EOMs intact No cervical or supraclavicular adenopathy Lungs no rales or rhonchi Heart regular  rate and rhythm Abd soft, nontender, positive bowel sounds MSK no focal spinal tenderness, no upper extremity lymphedema Neuro: nonfocal, well oriented, appropriate affect Breasts: Deferred Skin: The port appears intact, no erythema, swelling, or dehiscence  LAB RESULTS:  CMP     Component Value Date/Time   NA 137 07/24/2018 0825   K 3.5 07/24/2018 0825   CL 96 (L) 07/24/2018 0825   CO2 29 07/24/2018 0825   GLUCOSE 460 (H) 07/24/2018 0825   BUN 14 07/24/2018 0825   CREATININE 1.14 (H) 07/24/2018 0825   CREATININE 0.70 04/14/2015 1558   CALCIUM 10.0 07/24/2018 0825   PROT 7.9 07/24/2018 0825   ALBUMIN 3.9 07/24/2018 0825   AST 12 (L) 07/24/2018 0825   ALT 15 07/24/2018 0825   ALKPHOS 82 07/24/2018 0825   BILITOT 0.5 07/24/2018 0825   GFRNONAA 54 (L) 07/24/2018 0825   GFRNONAA 87 03/27/2014 1130   GFRAA >60 07/24/2018 0825   GFRAA >89 03/27/2014 1130    No results found for: TOTALPROTELP, ALBUMINELP, A1GS, A2GS, BETS, BETA2SER, GAMS, MSPIKE, SPEI  No results found for: KPAFRELGTCHN, LAMBDASER, KAPLAMBRATIO  Lab Results  Component Value Date   WBC 6.5 07/24/2018   NEUTROABS 3.6 07/24/2018   HGB 12.9 07/24/2018   HCT 38.8 07/24/2018   MCV 86.0 07/24/2018   PLT 342 07/24/2018    _0 @  No results found for: LABCA2  No components found for: [id number]  No results for input(s): INR in the last 168 hours.  No results found for: LABCA2  No results found for: DGU440  No results found for: HKV425  No results found for: ZDG387  No results found for: CA2729  No components found for: HGQUANT  No results found for:  CEA1 / No results found for: CEA1   No results found for: AFPTUMOR  No results found for: CHROMOGRNA  No results found for: PSA1  Admission on 08/01/2018, Discharged on 08/01/2018  Component Date Value Ref Range Status   Glucose-Capillary 08/01/2018 146* 70 - 99 mg/dL Final   Glucose-Capillary 08/01/2018 166* 70 - 99 mg/dL Final   Comment 1 08/01/2018 Document in Chart   Final    (this displays the last labs from the last 3 days)  No results found for: TOTALPROTELP, ALBUMINELP, A1GS, A2GS, BETS, BETA2SER, GAMS, MSPIKE, SPEI (this displays SPEP labs)  No results found for: KPAFRELGTCHN, LAMBDASER, KAPLAMBRATIO (kappa/lambda light chains)  No results found for: HGBA, HGBA2QUANT, HGBFQUANT, HGBSQUAN (Hemoglobinopathy evaluation)   No results found for: LDH  No results found for: IRON, TIBC, IRONPCTSAT (Iron and TIBC)  No results found for: FERRITIN  Urinalysis    Component Value Date/Time   COLORURINE YELLOW 03/07/2016 1350   APPEARANCEUR CLEAR 03/07/2016 1350   LABSPEC 1.031 (H) 03/07/2016 1350   PHURINE 5.0 03/07/2016 1350   GLUCOSEU >1000 (A) 03/07/2016 1350   HGBUR NEGATIVE 03/07/2016 1350   BILIRUBINUR NEGATIVE 03/07/2016 1350   BILIRUBINUR neg 11/24/2013 1547   KETONESUR 15 (A) 03/07/2016 1350   PROTEINUR NEGATIVE 03/07/2016 1350   UROBILINOGEN 0.2 11/24/2013 1547   UROBILINOGEN 0.2 11/10/2006 1153   NITRITE NEGATIVE 03/07/2016 1350   LEUKOCYTESUR NEGATIVE 03/07/2016 1350     STUDIES:  Dg Chest Port 1 View  Result Date: 08/01/2018 CLINICAL DATA:  Port-A-Cath placement EXAM: PORTABLE CHEST 1 VIEW COMPARISON:  August 06, 2014 FINDINGS: Port-A-Cath tip is at the cavoatrial junction. No pneumothorax. There is mild bibasilar atelectasis. The lungs elsewhere are clear. Heart is upper normal in size with pulmonary vascularity normal.  No adenopathy. No bone lesions. IMPRESSION: Port-A-Cath tip at cavoatrial junction. No pneumothorax. Bibasilar atelectasis. No edema  or consolidation. Heart upper normal in size. Electronically Signed   By: Lowella Grip III M.D.   On: 08/01/2018 09:19   Dg Fluoro Guide Cv Line-no Report  Result Date: 08/01/2018 Fluoroscopy was utilized by the requesting physician.  No radiographic interpretation.   US Breast Ltd Uni Right Inc Axilla  Result Date: 07/05/2018 CLINICAL DATA:  58 year old female with RIGHT breast masses identified on screening mammogram. EXAM: ULTRASOUND OF THE RIGHT BREAST COMPARISON:  Previous exam(s). FINDINGS: Targeted ultrasound of the RIGHT breast is performed, showing following: A 1.4 x 1.2 x 1.4 cm oval hypoechoic mass at the 5:30 position 8 cm from the nipple A 0.9 x 1 x 1 cm slightly irregular hypoechoic mass at the 10:30 position 9 cm from the nipple A 3 x 1.9 x 3 cm probable lymph node without fatty hilum in the LOWER RIGHT axilla. A RIGHT axillary lymph node with slightly thickened cortex. IMPRESSION: 1. 1.4 cm mass in the LOWER INNER RIGHT breast, 1 cm mass in the UPPER-OUTER RIGHT breast and markedly enlarged LOWER RIGHT axillary lymph node. The 2 masses within the RIGHT breast may represent masses or abnormal enlarged intraparenchymal lymph nodes. Tissue sampling is recommended. RECOMMENDATION: Ultrasound-guided biopsies of both RIGHT breast masses and markedly enlarged LOWER RIGHT axillary lymph node, to evaluate for malignancy/lymphoma. These procedures will be arranged. I have discussed the findings and recommendations with the patient. Results were also provided in writing at the conclusion of the visit. If applicable, a reminder letter will be sent to the patient regarding the next appointment. BI-RADS CATEGORY  4: Suspicious. Electronically Signed   By: Margarette Canada M.D.   On: 07/05/2018 13:36   [place] Axillary Node Core Biopsy Right  Addendum Date: 07/16/2018   ADDENDUM REPORT: 07/16/2018 07:34 ADDENDUM: Pathology revealed HIGH GRADE INVASIVE CARCINOMA of the Right breast, both locations, 5:30  o'clock and 10 o'clock. In the absence of other lesions, the phenotype would be consistent with breast carcinoma. Pathology revealed INVASIVE CARCINOMA of the Right axilla. No residual nodal tissue identified. This was found to be concordant by Dr. Dorise Bullion. Pathology results were discussed with the patient by telephone. The patient reported doing well after the biopsies with tenderness at the sites. Post biopsy instructions and care were reviewed and questions were answered. The patient was encouraged to call [facility] for any additional concerns. The patient was referred to [facility] on July 24, 2018. Pathology results reported by Terie Purser, RN on 07/16/2018. Electronically Signed   By: Dorise Bullion III M.D   On: 07/16/2018 07:34   Result Date: 07/16/2018 CLINICAL DATA:  Biopsy of right breast mass at 5:30. Biopsy of a right breast mass at 10. Biopsy of right axillary lymph node. EXAM: ULTRASOUND GUIDED RIGHT BREAST CORE NEEDLE BIOPSY COMPARISON:  Previous exam(s). FINDINGS: I met with the patient and we discussed the procedure of ultrasound-guided biopsy, including benefits and alternatives. We discussed the high likelihood of a successful procedure. We discussed the risks of the procedure, including infection, bleeding, tissue injury, clip migration, and inadequate sampling. Informed written consent was given. The usual time-out protocol was performed immediately prior to the procedure. Lesion quadrant: Lower-inner Using sterile technique and 1% Lidocaine as local anesthetic, under direct ultrasound visualization, a 12 gauge spring-loaded device was used to perform biopsy of  the right breast mass at 5:30 using a lateral approach. At the conclusion of the procedure a tissue marker clip was deployed into the biopsy cavity. Follow up 2 view mammogram was performed and dictated separately. Lesion  quadrant: Upper-outer Using sterile technique and 1% Lidocaine as local anesthetic, under direct ultrasound visualization, a 12 gauge spring-loaded device was used to perform biopsy of the right breast mass at 10 using a lateral approach. At the conclusion of the procedure a tissue marker clip was deployed into the biopsy cavity. Follow up 2 view mammogram was performed and dictated separately. Lesion quadrant: Right axilla. Using sterile technique and 1% Lidocaine as local anesthetic, under direct ultrasound visualization, a 14 gauge spring-loaded device was used to perform biopsy of an abnormal right axillary lymph node using a lateral approach. At the conclusion of the procedure a HydroMARK tissue marker clip was deployed into the biopsy cavity. Follow up 2 view mammogram was performed and dictated separately. IMPRESSION: Ultrasound guided biopsy of 2 right breast masses and an abnormal right axillary lymph node. No apparent complications. Electronically Signed: By: Dorise Bullion III M.D On: 07/10/2018 15:57   Mm Clip Placement Right  Result Date: 07/10/2018 CLINICAL DATA:  Evaluate for biopsy clip EXAM: DIAGNOSTIC RIGHT MAMMOGRAM POST ULTRASOUND BIOPSY COMPARISON:  Previous exam(s). FINDINGS: Mammographic images were obtained following ultrasound guided biopsy of 2 right breast masses and a right axillary lymph node. The ribbon shaped clip is within the biopsied 530 right breast mass. The coil shaped clip is within the right-sided 10 o'clock right breast mass. HydroMARK clip is within the abnormal right axillary lymph node. IMPRESSION: Appropriate clip placement as above. Final Assessment: Post Procedure Mammograms for Marker Placement Electronically Signed   By: Dorise Bullion III M.D   On: 07/10/2018 16:24   [place] Rt Breast Bx W Loc Dev 1st Lesion Img Bx Spec US Guide  Addendum Date: 07/16/2018   ADDENDUM REPORT: 07/16/2018 07:34 ADDENDUM: Pathology revealed HIGH GRADE INVASIVE CARCINOMA of the Right  breast, both locations, 5:30 o'clock and 10 o'clock. In the absence of other lesions, the phenotype would be consistent with breast carcinoma. Pathology revealed INVASIVE CARCINOMA of the Right axilla. No residual nodal tissue identified. This was found to be concordant by Dr. Dorise Bullion. Pathology results were discussed with the patient by telephone. The patient reported doing well after the biopsies with tenderness at the sites. Post biopsy instructions and care were reviewed and questions were answered. The patient was encouraged to call [facility] for any additional concerns. The patient was referred to [facility] on July 24, 2018. Pathology results reported by Terie Purser, RN on 07/16/2018. Electronically Signed   By: Dorise Bullion III M.D   On: 07/16/2018 07:34   Result Date: 07/16/2018 CLINICAL DATA:  Biopsy of right breast mass at 5:30. Biopsy of a right breast mass at 10. Biopsy of right axillary lymph node. EXAM: ULTRASOUND GUIDED RIGHT BREAST CORE NEEDLE BIOPSY COMPARISON:  Previous exam(s). FINDINGS: I met with the patient and we discussed the procedure of ultrasound-guided biopsy, including benefits and alternatives. We discussed the high likelihood of a successful procedure. We discussed the risks of the procedure, including infection, bleeding, tissue injury, clip migration, and inadequate sampling. Informed written consent was given. The usual time-out protocol was performed immediately prior to the procedure. Lesion quadrant: Lower-inner Using sterile technique and 1% Lidocaine as local anesthetic, under direct ultrasound visualization,  a 12 gauge spring-loaded device was used to perform biopsy of the right breast mass at 5:30 using a lateral approach. At the conclusion of the procedure a tissue marker clip was deployed into the biopsy cavity. Follow up 2 view mammogram was performed  and dictated separately. Lesion quadrant: Upper-outer Using sterile technique and 1% Lidocaine as local anesthetic, under direct ultrasound visualization, a 12 gauge spring-loaded device was used to perform biopsy of the right breast mass at 10 using a lateral approach. At the conclusion of the procedure a tissue marker clip was deployed into the biopsy cavity. Follow up 2 view mammogram was performed and dictated separately. Lesion quadrant: Right axilla. Using sterile technique and 1% Lidocaine as local anesthetic, under direct ultrasound visualization, a 14 gauge spring-loaded device was used to perform biopsy of an abnormal right axillary lymph node using a lateral approach. At the conclusion of the procedure a HydroMARK tissue marker clip was deployed into the biopsy cavity. Follow up 2 view mammogram was performed and dictated separately. IMPRESSION: Ultrasound guided biopsy of 2 right breast masses and an abnormal right axillary lymph node. No apparent complications. Electronically Signed: By: Dorise Bullion III M.D On: 07/10/2018 15:57   [place] Rt Breast Bx W Loc Dev Ea Add Lesion Img Bx Spec US Guide  Addendum Date: 07/16/2018   ADDENDUM REPORT: 07/16/2018 07:34 ADDENDUM: Pathology revealed HIGH GRADE INVASIVE CARCINOMA of the Right breast, both locations, 5:30 o'clock and 10 o'clock. In the absence of other lesions, the phenotype would be consistent with breast carcinoma. Pathology revealed INVASIVE CARCINOMA of the Right axilla. No residual nodal tissue identified. This was found to be concordant by Dr. Dorise Bullion. Pathology results were discussed with the patient by telephone. The patient reported doing well after the biopsies with tenderness at the sites. Post biopsy instructions and care were reviewed and questions were answered. The patient was encouraged to call [facility] for any additional concerns. The patient was referred to [facility] on July 24, 2018. Pathology results reported by Terie Purser, RN on 07/16/2018. Electronically Signed   By: Dorise Bullion III M.D   On: 07/16/2018 07:34   Result Date: 07/16/2018 CLINICAL DATA:  Biopsy of right breast mass at 5:30. Biopsy of a right breast mass at 10. Biopsy of right axillary lymph node. EXAM: ULTRASOUND GUIDED RIGHT BREAST CORE NEEDLE BIOPSY COMPARISON:  Previous exam(s). FINDINGS: I met with the patient and we discussed the procedure of ultrasound-guided biopsy, including benefits and alternatives. We discussed the high likelihood of a successful procedure. We discussed the risks of the procedure, including infection, bleeding, tissue injury, clip migration, and inadequate sampling. Informed written consent was given. The usual time-out protocol was performed immediately prior to the procedure. Lesion quadrant: Lower-inner Using sterile technique and 1% Lidocaine as local anesthetic, under direct ultrasound visualization, a 12 gauge spring-loaded device was used to perform biopsy of the right breast mass at 5:30 using a lateral approach. At the conclusion of the procedure a tissue marker clip was deployed into the biopsy cavity. Follow up 2 view mammogram was performed and dictated separately. Lesion quadrant: Upper-outer Using sterile technique and 1% Lidocaine as local anesthetic, under direct ultrasound visualization, a 12 gauge spring-loaded device was used to perform biopsy of the right breast mass at 10 using a lateral approach. At the conclusion of the procedure a tissue marker clip was deployed into the biopsy cavity.  Follow up 2 view mammogram was performed and dictated separately. Lesion quadrant: Right axilla. Using sterile technique and 1% Lidocaine as local anesthetic, under direct ultrasound visualization, a 14 gauge spring-loaded device was used to perform biopsy of an abnormal right axillary lymph node using a  lateral approach. At the conclusion of the procedure a HydroMARK tissue marker clip was deployed into the biopsy cavity. Follow up 2 view mammogram was performed and dictated separately. IMPRESSION: Ultrasound guided biopsy of 2 right breast masses and an abnormal right axillary lymph node. No apparent complications. Electronically Signed: By: Dorise Bullion III M.D On: 07/10/2018 15:57     ELIGIBLE FOR AVAILABLE RESEARCH PROTOCOL: No   ASSESSMENT: 58 y.o. [place], [place] woman status post right breast biopsy x2 for multicentric invasive ductal carcinoma, clinically T1c N1, stage IIB, grade 3, functionally triple negative, with an MIB-1 of 70%  (a) right axillary lymph node biopsied at the same time was positive  (1) neoadjuvant chemotherapy will consist of doxorubicin and cyclophosphamide in dose dense fashion x4 followed by weekly Abraxane and carboplatin x12 starting 08/07/2018  (2) definitive surgery to follow  (3) adjuvant radiation as appropriate  (4) genetics testing pending   PLAN: Tyrah did well with port placement and is ready to start chemotherapy.  She met with our chemotherapy teaching nurse and today I went over her supportive therapy protocol again in detail and gave her a second copy for her to review at home.  It is very favorable that her daughter has moved in with her from [place] and I will be helping her particularly with the first cycle.  This is usually the most difficult 1 as patients do not know what to expect.  I asked her to keep a symptom diary and bring it to her visit with me which will be approximately 1 week after her first dose.  After the first cycle we will not do nadir checks, in order to minimize visits during the pandemic time  I suggested when she develops bony aches or any other type of pain she should try the Tylenol plus naproxen combination which works quite well and does not cause symptoms like narcotics  I added OnPro to her medications to  avoid her having to come back an extra day, minimizing visits  She knows to call for any other issue that may develop before the next visit.  Tylon Kemmerling, Virgie Dad, MD  08/02/18 3:15 PM Medical Oncology and Hematology [facility] [address] [place], [place] [address] Tel. [phone]    Fax. [phone]    I, Wilburn Mylar, am acting as scribe for Dr. Virgie Dad. Aleta Manternach.  I, Lurline Del MD, have reviewed the above documentation for accuracy and completeness, and I agree with the above.

## 2018-08-01 NOTE — Transfer of Care (Signed)
Immediate Anesthesia Transfer of Care Note  Patient: Autumn Cook  Procedure(s) Performed: INSERTION PORT-A-CATH WITH ULTRASOUND (Right Chest)  Patient Location: PACU  Anesthesia Type:General  Level of Consciousness: sedated  Airway & Oxygen Therapy: spont breathing with face mask Post-op Assessment: Report given to RN  Post vital signs: Reviewed and stable  Last Vitals:  Vitals Value Taken Time  BP 166/83 08/01/2018  9:06 AM  Temp    Pulse 59 08/01/2018  9:07 AM  Resp 12 08/01/2018  9:07 AM  SpO2 100 % 08/01/2018  9:07 AM  Vitals shown include unvalidated device data.  Last Pain:  Vitals:   08/01/18 0706  TempSrc: Oral  PainSc: 0-No pain         Complications: No apparent anesthesia complications

## 2018-08-01 NOTE — Discharge Instructions (Signed)
°  Post Anesthesia Home Care Instructions  Activity: Get plenty of rest for the remainder of the day. A responsible individual must stay with you for 24 hours following the procedure.  For the next 24 hours, DO NOT: -Drive a car -Paediatric nurse -Drink alcoholic beverages -Take any medication unless instructed by your physician -Make any legal decisions or sign important papers.  Meals: Start with liquid foods such as gelatin or soup. Progress to regular foods as tolerated. Avoid greasy, spicy, heavy foods. If nausea and/or vomiting occur, drink only clear liquids until the nausea and/or vomiting subsides. Call your physician if vomiting continues.  Special Instructions/Symptoms: Your throat may feel dry or sore from the anesthesia or the breathing tube placed in your throat during surgery. If this causes discomfort, gargle with warm salt water. The discomfort should disappear within 24 hours.  If you had a scopolamine patch placed behind your ear for the management of post- operative nausea and/or vomiting:  1. The medication in the patch is effective for 72 hours, after which it should be removed.  Wrap patch in a tissue and discard in the trash. Wash hands thoroughly with soap and water. 2. You may remove the patch earlier than 72 hours if you experience unpleasant side effects which may include dry mouth, dizziness or visual disturbances. 3. Avoid touching the patch. Wash your hands with soap and water after contact with the patch.       [facility] - DISCHARGE INSTRUCTIONS TO PATIENT  Activity:  Driving - May drive in one or 2 days, if off pain meds   Lifting - No lifting more than 15 pounds for 5 days  Wound Care:   Leave wound dry for 2 days, then may shower  Diet:  As tolerated  Follow up appointment:  Call Dr. Pollie Friar office [facility]) at [phone] for an appointment in 2 - 3 months (midway through chemotherapy)  Medications and  dosages:  Resume your home medications.  You have a prescription for:  Vicodin  Call Dr. Lucia Gaskins or his office  [phone]) if you have:  Temperature greater than 100.4,  Persistent nausea and vomiting,  Severe uncontrolled pain,  Redness, tenderness, or signs of infection (pain, swelling, redness, odor or green/yellow discharge around the site),  Difficulty breathing, headache or visual disturbances,  Any other questions or concerns you may have after discharge.  In an emergency, call 911 or go to an Emergency Department at a nearby hospital.

## 2018-08-01 NOTE — Anesthesia Procedure Notes (Signed)
Procedure Name: LMA Insertion Date/Time: 08/01/2018 7:39 AM Performed by: Bonney Aid, CRNA Pre-anesthesia Checklist: Patient identified, Emergency Drugs available, Suction available and Patient being monitored Patient Re-evaluated:Patient Re-evaluated prior to induction Oxygen Delivery Method: Circle system utilized Preoxygenation: Pre-oxygenation with 100% oxygen Induction Type: IV induction Ventilation: Mask ventilation without difficulty LMA: LMA inserted LMA Size: 4.0 Number of attempts: 1 Airway Equipment and Method: Bite block Placement Confirmation: positive ETCO2 Tube secured with: Tape Dental Injury: Teeth and Oropharynx as per pre-operative assessment

## 2018-08-01 NOTE — Op Note (Signed)
08/01/2018  9:10 AM  PATIENT:  Autumn Cook, 58 y.o., female MRN: [id number] DOB: 11/02/1960  PREOP DIAGNOSIS:  right breast cancer, anticipate chemotherapy  POSTOP DIAGNOSIS:   Right breast cancer, anticipate chemotherapy  PROCEDURE:   Procedure(s): INSERTION PORT-A-CATH WITH ULTRASOUND, Right IJ vein  SURGEON:   Alphonsa Overall, M.D.  ANESTHESIA:   general  Anesthesiologist: Belinda Block, MD CRNA: Bonney Aid, CRNA  General  EBL:  minimal  ml  COUNTS CORRECT:  YES  INDICATIONS FOR PROCEDURE:  [name] is a 58 y.o. (DOB: 24-Dec-1960) AA female whose primary care physician is Copland, Gay Filler, MD and comes for power port placement for the treatment of right breast cancer.  [name] is her treating oncologist.   The indications and risks of the surgery were explained to the patient.  The risks include, but are not limited to, infection, bleeding, pneumothorax, nerve injury, and thrombosis of the vein.  OPERATIVE NOTE:  The patient was taken to OR room #2 at [facility].  Anesthesia was provided by Anesthesiologist: Belinda Block, MD CRNA: Bonney Aid, CRNA.  At the beginning of the operation, the patient was given 2 gm Ancef, had a roll placed under her back, and had the upper chest/neck prepped with Chloroprep and draped.   A time out was held and the surgery checklist reviewed.   The patient was placed in Trendelenburg position. I tried to access the left subclavian vein, but hit the left subclavian artery on the second stick.  I then turned attention to the right neck.  I used an [place] to identify the right internal jugular vein.  The right internal jugular vein was accessed with a 16 gauge needle and a guide wire threaded through the needle into the vein.  The position of the wire was checked with fluoroscopy.   I then developed a pocket in the upper inner aspect of the right chest for the port reservoir.  I used the [employer]  for venous access.  The reservoir was sewn in place with a 3-0 Vicryl suture.  The reservoir had been flushed with dilute (10 units/cc) heparin.   I then passed the silastic tubing from the reservoir incision to the subclavian stick site and used the 8 French introducer to pass it into the vein.  The tip of the silastic catheter was position at the junction of the SVC and the right atrium under fluoroscopy.  The silastic catheter was then attached to the port with the bayonet device.     The entire port and tubing were checked with fluoroscopy and then the port was flushed with 4 cc of concentrated heparin (100 units/cc).   The wounds were then closed with 3-0 vicryl subcutaneous sutures and the skin closed with a 4-0 Monocryl suture.  The skin was painted with DermaBond.   The patient was transferred to the recovery room in good condition.  The sponge and needle count were correct at the end of the case.  A CXR is ordered for port placement and pending at the time of this note.  Alphonsa Overall, MD, [facility] Pager: [phone] Office phone:  [phone]

## 2018-08-02 ENCOUNTER — Inpatient Hospital Stay (HOSPITAL_BASED_OUTPATIENT_CLINIC_OR_DEPARTMENT_OTHER): Payer: Managed Care, Other (non HMO) | Admitting: Oncology

## 2018-08-02 ENCOUNTER — Encounter (HOSPITAL_BASED_OUTPATIENT_CLINIC_OR_DEPARTMENT_OTHER): Payer: Self-pay | Admitting: Surgery

## 2018-08-02 ENCOUNTER — Other Ambulatory Visit: Payer: Self-pay

## 2018-08-02 VITALS — BP 157/92 | HR 63 | Temp 98.3°F | Resp 20 | Ht 66.0 in | Wt 196.5 lb

## 2018-08-02 DIAGNOSIS — C50811 Malignant neoplasm of overlapping sites of right female breast: Secondary | ICD-10-CM | POA: Diagnosis not present

## 2018-08-02 DIAGNOSIS — IMO0001 Reserved for inherently not codable concepts without codable children: Secondary | ICD-10-CM

## 2018-08-02 DIAGNOSIS — Z170 Estrogen receptor positive status [ER+]: Secondary | ICD-10-CM

## 2018-08-02 DIAGNOSIS — E1165 Type 2 diabetes mellitus with hyperglycemia: Secondary | ICD-10-CM | POA: Diagnosis not present

## 2018-08-02 DIAGNOSIS — G43109 Migraine with aura, not intractable, without status migrainosus: Secondary | ICD-10-CM

## 2018-08-05 ENCOUNTER — Encounter: Payer: Self-pay | Admitting: Oncology

## 2018-08-05 NOTE — Progress Notes (Signed)
Met w/ pt on 08/02/18 to introduce myself as her Arboriculturist and todiscuss copay assistance.  Pt gave me consent to apply in her behalf so I enrolled her in the [place]programfor [place] $15,000 for 12 monthsfrom3/30/20.Pt will pay $0 for her first treatment and $0 for each subsequent treatment (up to $7,200 per treatment).  I also informed her of the [employer], went over what it covers and gave her the income requirement.  Shestated she's over the income requirement so she doesn't qualify for the grant at this time.  She has my card for any questions or concerns she may have in the future.

## 2018-08-06 ENCOUNTER — Telehealth: Payer: Self-pay | Admitting: Licensed Clinical Social Worker

## 2018-08-07 ENCOUNTER — Inpatient Hospital Stay: Payer: Managed Care, Other (non HMO)

## 2018-08-07 ENCOUNTER — Other Ambulatory Visit: Payer: Self-pay

## 2018-08-07 ENCOUNTER — Telehealth: Payer: Self-pay

## 2018-08-07 ENCOUNTER — Inpatient Hospital Stay: Payer: Managed Care, Other (non HMO) | Attending: Oncology

## 2018-08-07 VITALS — BP 160/83 | HR 63 | Temp 98.1°F | Resp 18

## 2018-08-07 DIAGNOSIS — C50811 Malignant neoplasm of overlapping sites of right female breast: Secondary | ICD-10-CM | POA: Insufficient documentation

## 2018-08-07 DIAGNOSIS — R05 Cough: Secondary | ICD-10-CM | POA: Insufficient documentation

## 2018-08-07 DIAGNOSIS — Z170 Estrogen receptor positive status [ER+]: Principal | ICD-10-CM

## 2018-08-07 DIAGNOSIS — M129 Arthropathy, unspecified: Secondary | ICD-10-CM | POA: Insufficient documentation

## 2018-08-07 DIAGNOSIS — R531 Weakness: Secondary | ICD-10-CM | POA: Insufficient documentation

## 2018-08-07 DIAGNOSIS — Z95828 Presence of other vascular implants and grafts: Secondary | ICD-10-CM | POA: Insufficient documentation

## 2018-08-07 DIAGNOSIS — E1165 Type 2 diabetes mellitus with hyperglycemia: Secondary | ICD-10-CM | POA: Diagnosis not present

## 2018-08-07 DIAGNOSIS — E876 Hypokalemia: Secondary | ICD-10-CM | POA: Insufficient documentation

## 2018-08-07 DIAGNOSIS — Z79899 Other long term (current) drug therapy: Secondary | ICD-10-CM | POA: Diagnosis not present

## 2018-08-07 DIAGNOSIS — R5383 Other fatigue: Secondary | ICD-10-CM | POA: Diagnosis not present

## 2018-08-07 DIAGNOSIS — R110 Nausea: Secondary | ICD-10-CM | POA: Diagnosis not present

## 2018-08-07 DIAGNOSIS — Z801 Family history of malignant neoplasm of trachea, bronchus and lung: Secondary | ICD-10-CM | POA: Diagnosis not present

## 2018-08-07 DIAGNOSIS — Z5111 Encounter for antineoplastic chemotherapy: Secondary | ICD-10-CM | POA: Diagnosis not present

## 2018-08-07 DIAGNOSIS — J45909 Unspecified asthma, uncomplicated: Secondary | ICD-10-CM | POA: Diagnosis not present

## 2018-08-07 DIAGNOSIS — R51 Headache: Secondary | ICD-10-CM | POA: Diagnosis not present

## 2018-08-07 DIAGNOSIS — IMO0001 Reserved for inherently not codable concepts without codable children: Secondary | ICD-10-CM

## 2018-08-07 DIAGNOSIS — Z7689 Persons encountering health services in other specified circumstances: Secondary | ICD-10-CM | POA: Insufficient documentation

## 2018-08-07 DIAGNOSIS — R197 Diarrhea, unspecified: Secondary | ICD-10-CM | POA: Insufficient documentation

## 2018-08-07 DIAGNOSIS — Z171 Estrogen receptor negative status [ER-]: Secondary | ICD-10-CM | POA: Insufficient documentation

## 2018-08-07 DIAGNOSIS — Z794 Long term (current) use of insulin: Secondary | ICD-10-CM | POA: Diagnosis not present

## 2018-08-07 DIAGNOSIS — Z87891 Personal history of nicotine dependence: Secondary | ICD-10-CM | POA: Diagnosis not present

## 2018-08-07 LAB — CBC WITH DIFFERENTIAL/PLATELET
Abs Immature Granulocytes: 0.03 10*3/uL (ref 0.00–0.07)
Basophils Absolute: 0 10*3/uL (ref 0.0–0.1)
Basophils Relative: 1 %
Eosinophils Absolute: 0.2 10*3/uL (ref 0.0–0.5)
Eosinophils Relative: 3 %
HCT: 38 % (ref 36.0–46.0)
Hemoglobin: 12.5 g/dL (ref 12.0–15.0)
Immature Granulocytes: 1 %
Lymphocytes Relative: 28 %
Lymphs Abs: 1.9 10*3/uL (ref 0.7–4.0)
MCH: 28.6 pg (ref 26.0–34.0)
MCHC: 32.9 g/dL (ref 30.0–36.0)
MCV: 87 fL (ref 80.0–100.0)
Monocytes Absolute: 0.5 10*3/uL (ref 0.1–1.0)
Monocytes Relative: 7 %
Neutro Abs: 4 10*3/uL (ref 1.7–7.7)
Neutrophils Relative %: 60 %
Platelets: 371 10*3/uL (ref 150–400)
RBC: 4.37 MIL/uL (ref 3.87–5.11)
RDW: 13.4 % (ref 11.5–15.5)
WBC: 6.6 10*3/uL (ref 4.0–10.5)
nRBC: 0 % (ref 0.0–0.2)

## 2018-08-07 LAB — COMPREHENSIVE METABOLIC PANEL
ALT: 13 U/L (ref 0–44)
AST: 16 U/L (ref 15–41)
Albumin: 3.6 g/dL (ref 3.5–5.0)
Alkaline Phosphatase: 57 U/L (ref 38–126)
Anion gap: 12 (ref 5–15)
BUN: 12 mg/dL (ref 6–20)
CO2: 27 mmol/L (ref 22–32)
Calcium: 9.9 mg/dL (ref 8.9–10.3)
Chloride: 101 mmol/L (ref 98–111)
Creatinine, Ser: 0.96 mg/dL (ref 0.44–1.00)
GFR calc Af Amer: >60 mL/min (ref >60)
GFR calc non Af Amer: >60 mL/min (ref >60)
Glucose, Bld: 242 mg/dL — ABNORMAL HIGH (ref 70–99)
Potassium: 3.1 mmol/L — ABNORMAL LOW (ref 3.5–5.1)
Sodium: 140 mmol/L (ref 135–145)
Total Bilirubin: 0.5 mg/dL (ref 0.3–1.2)
Total Protein: 7.3 g/dL (ref 6.5–8.1)

## 2018-08-07 MED ORDER — SODIUM CHLORIDE 0.9 % IV SOLN
Freq: Once | INTRAVENOUS | Status: AC
  Administered 2018-08-07: 09:00:00 via INTRAVENOUS
  Filled 2018-08-07: qty 250

## 2018-08-07 MED ORDER — PEGFILGRASTIM 6 MG/0.6ML ~~LOC~~ PSKT
PREFILLED_SYRINGE | SUBCUTANEOUS | Status: AC
  Filled 2018-08-07: qty 0.6

## 2018-08-07 MED ORDER — PALONOSETRON HCL INJECTION 0.25 MG/5ML
INTRAVENOUS | Status: AC
  Filled 2018-08-07: qty 5

## 2018-08-07 MED ORDER — PALONOSETRON HCL INJECTION 0.25 MG/5ML
0.2500 mg | Freq: Once | INTRAVENOUS | Status: AC
  Administered 2018-08-07: 0.25 mg via INTRAVENOUS

## 2018-08-07 MED ORDER — DOXORUBICIN HCL CHEMO IV INJECTION 2 MG/ML
60.0000 mg/m2 | Freq: Once | INTRAVENOUS | Status: AC
  Administered 2018-08-07: 124 mg via INTRAVENOUS
  Filled 2018-08-07: qty 62

## 2018-08-07 MED ORDER — SODIUM CHLORIDE 0.9 % IV SOLN
600.0000 mg/m2 | Freq: Once | INTRAVENOUS | Status: AC
  Administered 2018-08-07: 1240 mg via INTRAVENOUS
  Filled 2018-08-07: qty 62

## 2018-08-07 MED ORDER — SODIUM CHLORIDE 0.9% FLUSH
10.0000 mL | Freq: Once | INTRAVENOUS | Status: AC
  Administered 2018-08-07: 10 mL
  Filled 2018-08-07: qty 10

## 2018-08-07 MED ORDER — SODIUM CHLORIDE 0.9 % IV SOLN
Freq: Once | INTRAVENOUS | Status: AC
  Administered 2018-08-07: 10:00:00 via INTRAVENOUS
  Filled 2018-08-07: qty 5

## 2018-08-07 MED ORDER — PEGFILGRASTIM 6 MG/0.6ML ~~LOC~~ PSKT
6.0000 mg | PREFILLED_SYRINGE | Freq: Once | SUBCUTANEOUS | Status: AC
  Administered 2018-08-07: 6 mg via SUBCUTANEOUS

## 2018-08-07 MED ORDER — HEPARIN SOD (PORK) LOCK FLUSH 100 UNIT/ML IV SOLN
500.0000 [IU] | Freq: Once | INTRAVENOUS | Status: AC | PRN
  Administered 2018-08-07: 12:00:00 500 [IU]
  Filled 2018-08-07: qty 5

## 2018-08-07 MED ORDER — SODIUM CHLORIDE 0.9% FLUSH
10.0000 mL | INTRAVENOUS | Status: DC | PRN
  Administered 2018-08-07: 10 mL
  Filled 2018-08-07: qty 10

## 2018-08-07 NOTE — Telephone Encounter (Signed)
All upcoming injection appts canceled d/t pt will get Onpro device after chemo

## 2018-08-07 NOTE — Patient Instructions (Addendum)
[facility] Discharge Instructions for Patients Receiving Chemotherapy  Today you received the following chemotherapy agents Doxorubicin and Cytoxan  To help prevent nausea and vomiting after your treatment, we encourage you to take your nausea medication as prescribed by MD. **DO NOT TAKE ZOFRAN FOR 3 DAYS AFTER CHEMOTHERAPY**   If you develop nausea and vomiting that is not controlled by your nausea medication, call the clinic.   BELOW ARE SYMPTOMS THAT SHOULD BE REPORTED IMMEDIATELY:  *FEVER GREATER THAN 100.5 F  *CHILLS WITH OR WITHOUT FEVER  NAUSEA AND VOMITING THAT IS NOT CONTROLLED WITH YOUR NAUSEA MEDICATION  *UNUSUAL SHORTNESS OF BREATH  *UNUSUAL BRUISING OR BLEEDING  TENDERNESS IN MOUTH AND THROAT WITH OR WITHOUT PRESENCE OF ULCERS  *URINARY PROBLEMS  *BOWEL PROBLEMS  UNUSUAL RASH Items with * indicate a potential emergency and should be followed up as soon as possible.  Feel free to call the clinic should you have any questions or concerns. The clinic phone number is [phone].  Please show the [facility] at check-in to the Emergency Department and triage nurse.   Doxorubicin injection What is this medicine? DOXORUBICIN (dox oh ROO bi sin) is a chemotherapy drug. It is used to treat many kinds of cancer like leukemia, lymphoma, neuroblastoma, sarcoma, and Wilms' tumor. It is also used to treat bladder cancer, breast cancer, lung cancer, ovarian cancer, stomach cancer, and thyroid cancer. This medicine may be used for other purposes; ask your health care provider or pharmacist if you have questions. COMMON BRAND NAME(S): Adriamycin, Adriamycin PFS, Adriamycin RDF, Rubex What should I tell my health care provider before I take this medicine? They need to know if you have any of these conditions: -heart disease -history of low blood counts caused by a medicine -liver disease -recent or ongoing radiation therapy -an unusual or allergic  reaction to doxorubicin, other chemotherapy agents, other medicines, foods, dyes, or preservatives -pregnant or trying to get pregnant -breast-feeding How should I use this medicine? This drug is given as an infusion into a vein. It is administered in a hospital or clinic by a specially trained health care professional. If you have pain, swelling, burning or any unusual feeling around the site of your injection, tell your health care professional right away. Talk to your pediatrician regarding the use of this medicine in children. Special care may be needed. Overdosage: If you think you have taken too much of this medicine contact a poison control center or emergency room at once. NOTE: This medicine is only for you. Do not share this medicine with others. What if I miss a dose? It is important not to miss your dose. Call your doctor or health care professional if you are unable to keep an appointment. What may interact with this medicine? This medicine may interact with the following medications: -6-mercaptopurine -paclitaxel -phenytoin -St. John's Wort -trastuzumab -verapamil This list may not describe all possible interactions. Give your health care provider a list of all the medicines, herbs, non-prescription drugs, or dietary supplements you use. Also tell them if you smoke, drink alcohol, or use illegal drugs. Some items may interact with your medicine. What should I watch for while using this medicine? This drug may make you feel generally unwell. This is not uncommon, as chemotherapy can affect healthy cells as well as cancer cells. Report any side effects. Continue your course of treatment even though you feel ill unless your doctor tells you to stop. There is a maximum amount of  this medicine you should receive throughout your life. The amount depends on the medical condition being treated and your overall health. Your doctor will watch how much of this medicine you receive in your  lifetime. Tell your doctor if you have taken this medicine before. You may need blood work done while you are taking this medicine. Your urine may turn red for a few days after your dose. This is not blood. If your urine is dark or brown, call your doctor. In some cases, you may be given additional medicines to help with side effects. Follow all directions for their use. Call your doctor or health care professional for advice if you get a fever, chills or sore throat, or other symptoms of a cold or flu. Do not treat yourself. This drug decreases your body's ability to fight infections. Try to avoid being around people who are sick. This medicine may increase your risk to bruise or bleed. Call your doctor or health care professional if you notice any unusual bleeding. Talk to your doctor about your risk of cancer. You may be more at risk for certain types of cancers if you take this medicine. Do not become pregnant while taking this medicine or for 6 months after stopping it. Women should inform their doctor if they wish to become pregnant or think they might be pregnant. Men should not father a child while taking this medicine and for 6 months after stopping it. There is a potential for serious side effects to an unborn child. Talk to your health care professional or pharmacist for more information. Do not breast-feed an infant while taking this medicine. This medicine has caused ovarian failure in some women and reduced sperm counts in some men This medicine may interfere with the ability to have a child. Talk with your doctor or health care professional if you are concerned about your fertility. This medicine may cause a decrease in Co-Enzyme Q-10. You should make sure that you get enough Co-Enzyme Q-10 while you are taking this medicine. Discuss the foods you eat and the vitamins you take with your health care professional. What side effects may I notice from receiving this medicine? Side effects that  you should report to your doctor or health care professional as soon as possible: -allergic reactions like skin rash, itching or hives, swelling of the face, lips, or tongue -breathing problems -chest pain -fast or irregular heartbeat -low blood counts - this medicine may decrease the number of white blood cells, red blood cells and platelets. You may be at increased risk for infections and bleeding. -pain, redness, or irritation at site where injected -signs of infection - fever or chills, cough, sore throat, pain or difficulty passing urine -signs of decreased platelets or bleeding - bruising, pinpoint red spots on the skin, black, tarry stools, blood in the urine -swelling of the ankles, feet, hands -tiredness -weakness Side effects that usually do not require medical attention (report to your doctor or health care professional if they continue or are bothersome): -diarrhea -hair loss -mouth sores -nail discoloration or damage -nausea -red colored urine -vomiting This list may not describe all possible side effects. Call your doctor for medical advice about side effects. You may report side effects to FDA at 1-800-FDA-1088. Where should I keep my medicine? This drug is given in a hospital or clinic and will not be stored at home. NOTE: This sheet is a summary. It may not cover all possible information. If you have questions about this  medicine, talk to your doctor, pharmacist, or health care provider.  2019 Elsevier/Gold Standard (2016-12-06 11:01:26)    Cyclophosphamide injection What is this medicine? CYCLOPHOSPHAMIDE (sye kloe FOSS fa mide) is a chemotherapy drug. It slows the growth of cancer cells. This medicine is used to treat many types of cancer like lymphoma, myeloma, leukemia, breast cancer, and ovarian cancer, to name a few. This medicine may be used for other purposes; ask your health care provider or pharmacist if you have questions. COMMON BRAND NAME(S): Cytoxan,  Neosar What should I tell my health care provider before I take this medicine? They need to know if you have any of these conditions: -blood disorders -history of other chemotherapy -infection -kidney disease -liver disease -recent or ongoing radiation therapy -tumors in the bone marrow -an unusual or allergic reaction to cyclophosphamide, other chemotherapy, other medicines, foods, dyes, or preservatives -pregnant or trying to get pregnant -breast-feeding How should I use this medicine? This drug is usually given as an injection into a vein or muscle or by infusion into a vein. It is administered in a hospital or clinic by a specially trained health care professional. Talk to your pediatrician regarding the use of this medicine in children. Special care may be needed. Overdosage: If you think you have taken too much of this medicine contact a poison control center or emergency room at once. NOTE: This medicine is only for you. Do not share this medicine with others. What if I miss a dose? It is important not to miss your dose. Call your doctor or health care professional if you are unable to keep an appointment. What may interact with this medicine? This medicine may interact with the following medications: -amiodarone -amphotericin B -azathioprine -certain antiviral medicines for HIV or AIDS such as protease inhibitors (e.g., indinavir, ritonavir) and zidovudine -certain blood pressure medications such as benazepril, captopril, enalapril, fosinopril, lisinopril, moexipril, monopril, perindopril, quinapril, ramipril, trandolapril -certain cancer medications such as anthracyclines (e.g., daunorubicin, doxorubicin), busulfan, cytarabine, paclitaxel, pentostatin, tamoxifen, trastuzumab -certain diuretics such as chlorothiazide, chlorthalidone, hydrochlorothiazide, indapamide, metolazone -certain medicines that treat or prevent blood clots like warfarin -certain muscle relaxants such as  succinylcholine -cyclosporine -etanercept -indomethacin -medicines to increase blood counts like filgrastim, pegfilgrastim, sargramostim -medicines used as general anesthesia -metronidazole -natalizumab This list may not describe all possible interactions. Give your health care provider a list of all the medicines, herbs, non-prescription drugs, or dietary supplements you use. Also tell them if you smoke, drink alcohol, or use illegal drugs. Some items may interact with your medicine. What should I watch for while using this medicine? Visit your doctor for checks on your progress. This drug may make you feel generally unwell. This is not uncommon, as chemotherapy can affect healthy cells as well as cancer cells. Report any side effects. Continue your course of treatment even though you feel ill unless your doctor tells you to stop. Drink water or other fluids as directed. Urinate often, even at night. In some cases, you may be given additional medicines to help with side effects. Follow all directions for their use. Call your doctor or health care professional for advice if you get a fever, chills or sore throat, or other symptoms of a cold or flu. Do not treat yourself. This drug decreases your body's ability to fight infections. Try to avoid being around people who are sick. This medicine may increase your risk to bruise or bleed. Call your doctor or health care professional if you notice any unusual bleeding.  Be careful brushing and flossing your teeth or using a toothpick because you may get an infection or bleed more easily. If you have any dental work done, tell your dentist you are receiving this medicine. You may get drowsy or dizzy. Do not drive, use machinery, or do anything that needs mental alertness until you know how this medicine affects you. Do not become pregnant while taking this medicine or for 1 year after stopping it. Women should inform their doctor if they wish to become  pregnant or think they might be pregnant. Men should not father a child while taking this medicine and for 4 months after stopping it. There is a potential for serious side effects to an unborn child. Talk to your health care professional or pharmacist for more information. Do not breast-feed an infant while taking this medicine. This medicine may interfere with the ability to have a child. This medicine has caused ovarian failure in some women. This medicine has caused reduced sperm counts in some men. You should talk with your doctor or health care professional if you are concerned about your fertility. If you are going to have surgery, tell your doctor or health care professional that you have taken this medicine. What side effects may I notice from receiving this medicine? Side effects that you should report to your doctor or health care professional as soon as possible: -allergic reactions like skin rash, itching or hives, swelling of the face, lips, or tongue -low blood counts - this medicine may decrease the number of white blood cells, red blood cells and platelets. You may be at increased risk for infections and bleeding. -signs of infection - fever or chills, cough, sore throat, pain or difficulty passing urine -signs of decreased platelets or bleeding - bruising, pinpoint red spots on the skin, black, tarry stools, blood in the urine -signs of decreased red blood cells - unusually weak or tired, fainting spells, lightheadedness -breathing problems -dark urine -dizziness -palpitations -swelling of the ankles, feet, hands -trouble passing urine or change in the amount of urine -weight gain -yellowing of the eyes or skin Side effects that usually do not require medical attention (report to your doctor or health care professional if they continue or are bothersome): -changes in nail or skin color -hair loss -missed menstrual periods -mouth sores -nausea, vomiting This list may not  describe all possible side effects. Call your doctor for medical advice about side effects. You may report side effects to FDA at 1-800-FDA-1088. Where should I keep my medicine? This drug is given in a hospital or clinic and will not be stored at home. NOTE: This sheet is a summary. It may not cover all possible information. If you have questions about this medicine, talk to your doctor, pharmacist, or health care provider.  2019 Elsevier/Gold Standard (2012-03-08 16:22:58)

## 2018-08-08 ENCOUNTER — Other Ambulatory Visit: Payer: Self-pay

## 2018-08-08 ENCOUNTER — Telehealth: Payer: Self-pay | Admitting: *Deleted

## 2018-08-08 ENCOUNTER — Emergency Department (HOSPITAL_COMMUNITY)
Admission: EM | Admit: 2018-08-08 | Discharge: 2018-08-08 | Disposition: A | Discharge status: Home / Self Care | Payer: Managed Care, Other (non HMO) | Attending: Emergency Medicine | Admitting: Emergency Medicine

## 2018-08-08 ENCOUNTER — Encounter (HOSPITAL_COMMUNITY): Payer: Self-pay | Admitting: Emergency Medicine

## 2018-08-08 DIAGNOSIS — Z853 Personal history of malignant neoplasm of breast: Secondary | ICD-10-CM | POA: Insufficient documentation

## 2018-08-08 DIAGNOSIS — Z9221 Personal history of antineoplastic chemotherapy: Secondary | ICD-10-CM | POA: Diagnosis not present

## 2018-08-08 DIAGNOSIS — R739 Hyperglycemia, unspecified: Secondary | ICD-10-CM | POA: Diagnosis not present

## 2018-08-08 DIAGNOSIS — R110 Nausea: Secondary | ICD-10-CM | POA: Diagnosis not present

## 2018-08-08 DIAGNOSIS — Z5321 Procedure and treatment not carried out due to patient leaving prior to being seen by health care provider: Secondary | ICD-10-CM | POA: Insufficient documentation

## 2018-08-08 DIAGNOSIS — R51 Headache: Secondary | ICD-10-CM | POA: Diagnosis not present

## 2018-08-08 LAB — CBG MONITORING, ED: Glucose-Capillary: 338 mg/dL — ABNORMAL HIGH (ref 70–99)

## 2018-08-08 NOTE — ED Triage Notes (Signed)
Patient c/o hyperglycemia today. CBG 338 in triage. Also reports headache and nausea. Hx breast cancer. Last chemo yesterday.

## 2018-08-08 NOTE — ED Notes (Signed)
Patient ambulatory to triage desk. States "my doctor called me back and said they would follow up with me tomorrow at the cancer center, so I am leaving."

## 2018-08-08 NOTE — ED Notes (Signed)
Urine specimen and culture sent to the lab.

## 2018-08-08 NOTE — Telephone Encounter (Signed)
Called pt to assess needs after 1st chemo. Contact information provided for questions or needs.

## 2018-08-09 ENCOUNTER — Encounter: Payer: Self-pay | Admitting: Family Medicine

## 2018-08-09 ENCOUNTER — Telehealth: Payer: Self-pay | Admitting: *Deleted

## 2018-08-09 ENCOUNTER — Encounter: Payer: Self-pay | Admitting: Oncology

## 2018-08-09 ENCOUNTER — Ambulatory Visit: Payer: Managed Care, Other (non HMO)

## 2018-08-09 MED ORDER — INSULIN ASPART 100 UNIT/ML FLEXPEN: PEN_INJECTOR | SUBCUTANEOUS | 3 refills | Status: DC

## 2018-08-09 NOTE — Telephone Encounter (Signed)
This RN returned call per [facility] as well as [employer].  Autumn Cook states her blood sugar this am is 246 ( went as high as 546 last night ).  She has placed a mychart message to Dr Edilia Bo who manages her diabetes.  Per [employer] message to this office she stated ongoing headache- with severity post chemo.  This RN discussed that both concerns are secondary to her treatment - steroids prescribed can cause transient raise in blood sugar - as well as the anti nausea medication can cause headache.  Noted pt does not have a sliding scale regimen for her diabetes- she will discuss with Dr Edilia Bo as well as Dr Jana Hakim will be made aware for appropriate recommendations for next chemo cycle.  This RN discussed headache and advised Autumn Cook to take benadryl and tylenol and drink gatorade- this combination has been shown to alleviate headache.  She is having some mild bone pain - she does not have claritin - this RN discussed reason for bone pain secondary to neulasta and use of claritin which is an OTC medication- generic name is loratidine.  All questions answered with plan for above interventions- Autumn Cook stated appreciation of call and understands to call again if she has further concerns.

## 2018-08-12 ENCOUNTER — Telehealth: Payer: Self-pay | Admitting: Oncology

## 2018-08-12 NOTE — Telephone Encounter (Signed)
Added lab to 4/8 f/u. Spoke with patient.

## 2018-08-13 ENCOUNTER — Encounter: Payer: Self-pay | Admitting: Licensed Clinical Social Worker

## 2018-08-13 ENCOUNTER — Ambulatory Visit: Payer: Self-pay | Admitting: Licensed Clinical Social Worker

## 2018-08-13 DIAGNOSIS — Z452 Encounter for adjustment and management of vascular access device: Secondary | ICD-10-CM | POA: Insufficient documentation

## 2018-08-13 DIAGNOSIS — Z170 Estrogen receptor positive status [ER+]: Secondary | ICD-10-CM

## 2018-08-13 DIAGNOSIS — Z1379 Encounter for other screening for genetic and chromosomal anomalies: Secondary | ICD-10-CM | POA: Insufficient documentation

## 2018-08-13 DIAGNOSIS — C50811 Malignant neoplasm of overlapping sites of right female breast: Secondary | ICD-10-CM

## 2018-08-13 NOTE — Progress Notes (Signed)
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 She has daughter [name] on the phone to participate in the visit.  She was started on doxorubicin and cytoxan on 08/07/2018 and receives this every 2 weeks. She reports nausea, morning headaches, sore throat, fatigue, and weakness. She states she only vomited once. She has experienced some diarrhea in the morning for the last several days.  Her most recent echocardiogram on 07/31/2018 showed an ejection fraction of 60-65%.  Her genetics testing results returned showing: Negative testing (no pathogenic variants identified) on the [facility] Breast + GYN cancers panel. The Breast + GYN Cancers Panel offered by [facility] includes sequencing and/or deletion duplication testing of the following 23 genes: ATM, BARD1, BRCA1, BRCA2, BRIP1, CDH1, CHEK2, DICER1, EPCAM, MLH1, MSH2, MSH6, NBN, NF1, PALB2, PMS2, PTEN, RAD50, RAD51C, RAD51D, SMARCA4, STK11, TP53. The report date is 08/06/2018.   REVIEW OF SYSTEMS: Autumn Cook reports her sugar levels have been up and down (high 100s to low 200s, per [place]). She has been drinking warm tea for her sore throat. She reports that she has been too fatigued to walk the last few days.  She reports she feels better, less fatigued, when not taking the farxiga. She has been working on her diet, and is noted to have a 10 lb weight loss since her last visit. Daughter [name] does the shopping.   The patient denies unusual headaches, visual changes, stiff neck, dizziness, or gait imbalance. There has been no cough, phlegm production, or pleurisy, no chest pain or pressure, and no change in bladder habits. The patient denies fever, rash, bleeding, unexplained fatigue or unexplained weight loss. A detailed review of systems was otherwise entirely negative.   HISTORY OF CURRENT ILLNESS: From the original intake note:  Autumn Cook had routine screening mammography on 07/01/2018 showing a possible abnormality in the right breast; she notes that did feel some pain and a lump in her breast leading up to the routine screening. She underwent right breast ultrasonography at [facility] on 07/05/2018 showing: A 1.4 x 1.2 x 1.4 cm oval hypoechoic mass at the 5:30 position 8 cm from the nipple. A 0.9 x 1 x 1 cm slightly irregular hypoechoic mass at the 10:30 position 9 cm from the nipple. A 3 x 1.9 x 3 cm probable lymph node without fatty hilum in the LOWER RIGHT axilla. A RIGHT axillary lymph node with slightly thickened cortex.  Accordingly on 07/10/2018 she proceeded to biopsy of the right breast area in question. The pathology from this procedure showed ([id number]): invasive ductal carcinoma, high grade, 5:30 o'clock. Prognostic indicators significant for: estrogen receptor, 30% positive with weak staining intensity and progesterone receptor, 0% negative. Proliferation marker Ki67 at 70%.   An additional biopsy was performed on the same day ([id number]) showing:  2. Breast, right, needle core biopsy, 10 o'clock - Invasive  carcinoma  Finally, an additional biopsy was performed at the right axilla on the same day ([id number]) showing: invasive carcinoma, right axilla. Prognostic indicators  significant for: estrogen receptor, 70% positive, with weak staining intensity and progesterone receptor, 0% negative. Proliferation marker Ki67 at 70%. HER2 positive by immunohistochemistry.    The patient's subsequent history is as detailed below.    PAST MEDICAL HISTORY: Past Medical History:  Diagnosis Date  . Allergy   . Arthritis   . Asthma   . Cancer [facility])    R breast cancer  . Diabetes mellitus without complication ([place])    uncontrolled  . Family history of adverse reaction to anesthesia    "entire family takes a long time to wake up- N&V for several days"  . Family history of lung cancer   . Hypertension   . Migraine   . PONV (postoperative nausea and vomiting)      PAST SURGICAL HISTORY: Past Surgical History:  Procedure Laterality Date  . BREAST CYST EXCISION  1994  . DILATION AND CURETTAGE OF UTERUS    . PORTACATH PLACEMENT Right 08/01/2018   Procedure: INSERTION PORT-A-CATH WITH ULTRASOUND;  Surgeon: Alphonsa Overall, MD;  Location: [facility];  Service: General;  Laterality: Right;     FAMILY HISTORY: Family History  Problem Relation Age of Onset  . Hypertension Father   . Cancer Father 88       lung cancer  . Stroke Father   . Heart disease Mother        arrhythmia  . Hypertension Mother   . Kidney failure Sister    Philomina's father died from lung cancer at age 58; he was a heavy smoker. Patients' mother is 83 as of 07/2018. The patient has 3 brothers and 2 sisters. Patient denies anyone in her family having breast, ovarian, prostate, or pancreatic cancer. Autumn Cook's maternal great great uncle was diagnosed with lung cancer.     GYNECOLOGIC HISTORY:  Patient's last menstrual period was 11/14/2013. 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 Her husband, [name], is a Administrator. At home with them is two toy poodles. [name] has 4 children, [name], Eppie Gibson, and [name]. [name] is 73, lives in [place], and works in Science writer. [name] is 33, lives in [place], and works in Engineer, technical sales. [name] is 53, is taking care of Kazzandra, and works as a Control and instrumentation engineer. [name] is 55, lives in [place], and is a Buyer, retail.   ADVANCED DIRECTIVES: In the absence of any documentation, Hildred's spouse, [name], is her healthcare power of attorney.     HEALTH MAINTENANCE: Social History   Tobacco Use  . Smoking status: Former Smoker    Packs/day: 1.00    Years: 5.00    Pack years: 5.00    Last attempt to quit: 05/08/1988    Years since quitting: 30.2  . Smokeless tobacco: Never Used  Substance Use Topics  . Alcohol use: No  . Drug use: No    Colonoscopy: yes, 2010 - due 2020.  PAP: 2017  Bone density: yes, 2015, normal Mammography: 07/01/2018   Allergies  Allergen Reactions  . Penicillins Hives    Did it involve swelling of the face/tongue/throat, SOB, or low BP? Yes--syncope episode Did it  involve sudden or severe rash/hives, skin peeling, or any reaction on the inside of your mouth or nose? No Did you need to seek medical attention at a hospital or doctor's office? Yes When did it last happen? 37-38 YEARS AGO If all above answers are "NO", may proceed with cephalosporin use.   . Metformin And Related Diarrhea  . Adhesive [Tape] Other (See Comments)    "severe bruising"  ALSO SKIN GLUE  . Asa [Aspirin] Other (See Comments)    GI upset     Current Outpatient Medications  Medication Sig Dispense Refill  . albuterol (PROVENTIL HFA;VENTOLIN HFA) 108 (90 Base) MCG/ACT inhaler Inhale 2 puffs into the lungs every 4 (four) hours as needed for wheezing or shortness of breath (cough, shortness of breath or wheezing.). 1 Inhaler 1  . atenolol (TENORMIN) 100 MG tablet TAKE 1 TABLET BY MOUTH  EVERY DAY (Patient taking differently: Take 100 mg by mouth every evening. ) 30 tablet 5  . atorvastatin (LIPITOR) 20 MG tablet Take 1 tablet (20 mg total) by mouth daily. 90 tablet 3  . blood glucose meter kit and supplies KIT Dispense based on patient and insurance preference. Use up to four times daily as directed. (FOR ICD-9 250.00, 250.01). 1 each 0  . ciprofloxacin (CIPRO) 500 MG tablet Take 1 tablet (500 mg total) by mouth 2 (two) times daily. 60 tablet 0  . dapagliflozin propanediol (FARXIGA) 5 MG TABS tablet Take 5 mg by mouth daily. 30 tablet 6  . dexamethasone (DECADRON) 4 MG tablet Take 1 tablet (4 mg total) by mouth 2 (two) times daily. Take 2 tablets by mouth once a day on the day after chemotherapy and then take 2 tablets two times a day for 2 days. Take with food. 30 tablet 1  . glipiZIDE (GLUCOTROL) 5 MG tablet Start with 5 mg once a day, then increase to twice a day after 1 week.  Continue to increase as directed by MD 120 tablet 3  . glucose blood (ACCU-CHEK ACTIVE STRIPS) test strip Use as instructed- check blood sugar up to 2x a day 100 each 12  . HYDROcodone-acetaminophen (NORCO/VICODIN) 5-325 MG tablet Take 1 tablet by mouth every 6 (six) hours as needed for moderate pain. 10 tablet 0  . insulin aspart (NOVOLOG FLEXPEN) 100 UNIT/ML FlexPen If glucose 250-300 give 4 units. If glucose 301- 350 give 6 units, if glucose 351- 400 give 8 units.  If higher than 400 give 10 units.   May repeat in one hour if glucose still over 400. 15 mL 3  . Insulin Glargine (BASAGLAR KWIKPEN) 100 UNIT/ML SOPN INJECT 14 UNITS SUBCUTANEOUSLY DAILY (Patient taking differently: Inject 16 Units into the skin daily. ) 15 mL 3  . Insulin Pen Needle (PEN NEEDLES) 32G X 4 MM MISC 1 each by Does not apply route daily. 100 each 3  . Insulin Pen Needle (PEN NEEDLES) 32G X 4 MM MISC 1 each by Does not apply route daily. 100 each 3  . lidocaine-prilocaine (EMLA) cream Apply to affected area once 30 g 3  .  lisinopril-hydrochlorothiazide (ZESTORETIC) 20-12.5 MG tablet Take 2 tablets by mouth daily. (Patient taking differently: Take 2 tablets by mouth every evening. ) 180 tablet 3  . loratadine (CLARITIN) 10 MG tablet Take 1 tablet (10 mg total) by mouth daily.    Marland Kitchen LORazepam (ATIVAN) 0.5 MG tablet Take 1 tablet (0.5 mg total) by mouth at bedtime as needed (Nausea or vomiting). 30 tablet 0  . omeprazole (  PRILOSEC) 40 MG capsule Take 1 capsule (40 mg total) by mouth daily. 60 capsule 1  . ONE TOUCH ULTRA TEST test strip Check sugar 2 times daily 100 each prn  . ONETOUCH DELICA LANCETS 54T MISC Check sugar 2 times daily 100 each prn  . prochlorperazine (COMPAZINE) 10 MG tablet Take 1 tablet (10 mg total) by mouth every 6 (six) hours as needed (Nausea or vomiting). 30 tablet 1  . Vitamin D, Ergocalciferol, (DRISDOL) 1.25 MG (50000 UT) CAPS capsule TAKE 1 CAPSULE BY MOUTH EVERY 7 DAYS (Patient taking differently: Take 50,000 Units by mouth every Saturday. ) 12 capsule 1   No current facility-administered medications for this visit.      OBJECTIVE: Middle-aged African-American woman in no acute distress  Vitals:   08/14/18 1336  BP: 119/76  Pulse: 81  Resp: 18  Temp: 98.4 F (36.9 C)  SpO2: 100%     Body mass index is 30.44 kg/m.   Wt Readings from Last 3 Encounters:  08/14/18 188 lb 9.6 oz (85.5 kg)  08/02/18 196 lb 8 oz (89.1 kg)  08/01/18 195 lb 1.7 oz (88.5 kg)      ECOG FS:1 - Symptomatic but completely ambulatory  Sclerae unicteric, pupils round and equal Oropharynx clear and moist--no evidence of thrush, no ulcerations No cervical or supraclavicular adenopathy Lungs no rales or rhonchi Heart regular rate and rhythm Abd soft, nontender, positive bowel sounds MSK no focal spinal tenderness, no upper extremity lymphedema Neuro: nonfocal, well oriented, positive affect Breasts: Deferred  LAB RESULTS:  CMP     Component Value Date/Time   NA 138 08/14/2018 1308   K 3.4 (L)  08/14/2018 1308   CL 98 08/14/2018 1308   CO2 29 08/14/2018 1308   GLUCOSE 246 (H) 08/14/2018 1308   BUN 14 08/14/2018 1308   CREATININE 0.99 08/14/2018 1308   CREATININE 1.14 (H) 07/24/2018 0825   CREATININE 0.70 04/14/2015 1558   CALCIUM 9.8 08/14/2018 1308   PROT 7.2 08/14/2018 1308   ALBUMIN 3.7 08/14/2018 1308   AST 9 (L) 08/14/2018 1308   AST 12 (L) 07/24/2018 0825   ALT 10 08/14/2018 1308   ALT 15 07/24/2018 0825   ALKPHOS 72 08/14/2018 1308   BILITOT 0.6 08/14/2018 1308   BILITOT 0.5 07/24/2018 0825   GFRNONAA >60 08/14/2018 1308   GFRNONAA 54 (L) 07/24/2018 0825   GFRNONAA 87 03/27/2014 1130   GFRAA >60 08/14/2018 1308   GFRAA >60 07/24/2018 0825   GFRAA >89 03/27/2014 1130    No results found for: TOTALPROTELP, ALBUMINELP, A1GS, A2GS, BETS, BETA2SER, GAMS, MSPIKE, SPEI  No results found for: KPAFRELGTCHN, LAMBDASER, KAPLAMBRATIO  Lab Results  Component Value Date   WBC 1.0 (L) 08/14/2018   NEUTROABS 0.1 (LL) 08/14/2018   HGB 11.2 (L) 08/14/2018   HCT 34.0 (L) 08/14/2018   MCV 87.2 08/14/2018   PLT 169 08/14/2018    _0 @  No results found for: LABCA2  No components found for: [id number]  No results for input(s): INR in the last 168 hours.  No results found for: LABCA2  No results found for: DSK876  No results found for: OTL572  No results found for: IOM355  No results found for: CA2729  No components found for: HGQUANT  No results found for: CEA1 / No results found for: CEA1   No results found for: AFPTUMOR  No results found for: [place]  No results found for: PSA1  Appointment on 08/14/2018  Component Date Value Ref Range  Status  . Sodium 08/14/2018 138  135 - 145 mmol/L Final  . Potassium 08/14/2018 3.4* 3.5 - 5.1 mmol/L Final  . Chloride 08/14/2018 98  98 - 111 mmol/L Final  . CO2 08/14/2018 29  22 - 32 mmol/L Final  . Glucose, Bld 08/14/2018 246* 70 - 99 mg/dL Final  . BUN 08/14/2018 14  6 - 20 mg/dL Final  .  Creatinine, Ser 08/14/2018 0.99  0.44 - 1.00 mg/dL Final  . Calcium 08/14/2018 9.8  8.9 - 10.3 mg/dL Final  . Total Protein 08/14/2018 7.2  6.5 - 8.1 g/dL Final  . Albumin 08/14/2018 3.7  3.5 - 5.0 g/dL Final  . AST 08/14/2018 9* 15 - 41 U/L Final  . ALT 08/14/2018 10  0 - 44 U/L Final  . Alkaline Phosphatase 08/14/2018 72  38 - 126 U/L Final  . Total Bilirubin 08/14/2018 0.6  0.3 - 1.2 mg/dL Final  . GFR calc non Af Amer 08/14/2018 >60  >60 mL/min Final  . GFR calc Af Amer 08/14/2018 >60  >60 mL/min Final  . Anion gap 08/14/2018 11  5 - 15 Final   Performed at [facility], [place] [address] [place], [place] [address]  . WBC 08/14/2018 1.0* 4.0 - 10.5 K/uL Final  . RBC 08/14/2018 3.90  3.87 - 5.11 MIL/uL Final  . Hemoglobin 08/14/2018 11.2* 12.0 - 15.0 g/dL Final  . HCT 08/14/2018 34.0* 36.0 - 46.0 % Final  . MCV 08/14/2018 87.2  80.0 - 100.0 fL Final  . MCH 08/14/2018 28.7  26.0 - 34.0 pg Final  . MCHC 08/14/2018 32.9  30.0 - 36.0 g/dL Final  . RDW 08/14/2018 12.9  11.5 - 15.5 % Final  . Platelets 08/14/2018 169  150 - 400 K/uL Final  . nRBC 08/14/2018 0.0  0.0 - 0.2 % Final  . Neutrophils Relative % 08/14/2018 8  % Final  . Neutro Abs 08/14/2018 0.1* 1.7 - 7.7 K/uL Final   This critical result has verified and been called to Katheren Puller, RN by Rolland Porter on 04 08 2020 at 1340, and has been read back.   . Lymphocytes Relative 08/14/2018 77  % Final  . Lymphs Abs 08/14/2018 0.8  0.7 - 4.0 K/uL Final  . Monocytes Relative 08/14/2018 7  % Final  . Monocytes Absolute 08/14/2018 0.1  0.1 - 1.0 K/uL Final  . Eosinophils Relative 08/14/2018 6  % Final  . Eosinophils Absolute 08/14/2018 0.1  0.0 - 0.5 K/uL Final  . Basophils Relative 08/14/2018 2  % Final  . Basophils Absolute 08/14/2018 0.0  0.0 - 0.1 K/uL Final  . Immature Granulocytes 08/14/2018 0  % Final  . Abs Immature Granulocytes 08/14/2018 0.00  0.00 - 0.07 K/uL Final   Performed at [facility], [address] [place], [place] [address]    (this displays the last labs from the last 3 days)  No results found for: TOTALPROTELP, ALBUMINELP, A1GS, A2GS, BETS, BETA2SER, GAMS, MSPIKE, SPEI (this displays SPEP labs)  No results found for: KPAFRELGTCHN, LAMBDASER, KAPLAMBRATIO (kappa/lambda light chains)  No results found for: HGBA, HGBA2QUANT, HGBFQUANT, HGBSQUAN (Hemoglobinopathy evaluation)   No results found for: LDH  No results found for: IRON, TIBC, IRONPCTSAT (Iron and TIBC)  No results found for: FERRITIN  Urinalysis    Component Value Date/Time   COLORURINE YELLOW 03/07/2016 1350   APPEARANCEUR CLEAR 03/07/2016 1350   LABSPEC 1.031 (H) 03/07/2016 1350   PHURINE 5.0 03/07/2016 1350  GLUCOSEU >1000 (A) 03/07/2016 1350   HGBUR NEGATIVE 03/07/2016 1350   BILIRUBINUR NEGATIVE 03/07/2016 1350   BILIRUBINUR neg 11/24/2013 1547   KETONESUR 15 (A) 03/07/2016 1350   PROTEINUR NEGATIVE 03/07/2016 1350   UROBILINOGEN 0.2 11/24/2013 1547   UROBILINOGEN 0.2 11/10/2006 1153   NITRITE NEGATIVE 03/07/2016 1350   LEUKOCYTESUR NEGATIVE 03/07/2016 1350     STUDIES:  Dg Chest Port 1 View  Result Date: 08/01/2018 CLINICAL DATA:  Port-A-Cath placement EXAM: PORTABLE CHEST 1 VIEW COMPARISON:  August 06, 2014 FINDINGS: Port-A-Cath tip is at the cavoatrial junction. No pneumothorax. There is mild bibasilar atelectasis. The lungs elsewhere are clear. Heart is upper normal in size with pulmonary vascularity normal. No adenopathy. No bone lesions. IMPRESSION: Port-A-Cath tip at cavoatrial junction. No pneumothorax. Bibasilar atelectasis. No edema or consolidation. Heart upper normal in size. Electronically Signed   By: Lowella Grip III M.D.   On: 08/01/2018 09:19   Dg Fluoro Guide Cv Line-no Report  Result Date: 08/01/2018 Fluoroscopy was utilized by the requesting physician.  No radiographic interpretation.     ELIGIBLE FOR AVAILABLE RESEARCH PROTOCOL: No    ASSESSMENT: 58 y.o. [place], [place] woman status post right breast biopsy x2 for multicentric invasive ductal carcinoma, clinically T1c N1, stage IIB, grade 3, functionally triple negative, with an MIB-1 of 70%  (a) right axillary lymph node biopsied at the same time was positive  (1) neoadjuvant chemotherapy will consist of doxorubicin and cyclophosphamide in dose dense fashion x4 followed by weekly Abraxane and carboplatin x12 starting 08/07/2018  (2) definitive surgery to follow  (3) adjuvant radiation as appropriate  (4) genetics testing 08/06/2018 through the Breast + GYN Cancers Panel offered by [facility] found no deleterious mutations in ATM, BARD1, BRCA1, BRCA2, BRIP1, CDH1, CHEK2, DICER1, EPCAM, MLH1, MSH2, MSH6, NBN, NF1, PALB2, PMS2, PTEN, RAD50, RAD51C, RAD51D, SMARCA4, STK11, TP53.     PLAN: Eriyana did generally well with her first cycle of chemotherapy.  I do think we can do better.  She is significantly neutropenic.  I am going to drop the chemotherapy dose by 20%.  Also I started her on ciprofloxacin today.  She will take that for the next 5days prophylactically.  She understands if she develops a fever she needs to call us immediately  She had significant nausea although not much vomiting.  She also had significant problems with hyperglycemia.  I have dropped the dexamethasone in the premeds from 12 mg to 4 mg and I am dropping the antinausea dexamethasone dose from 8 mg to 4 mg.  I hope that this will help the hyper glycemia problem without compromising the nausea control.  I am also adding ondansetron for her to take as needed beginning day 3.  I think the sore throat may be related to reflux problems and I have asked her to take ondansetron at bedtime beginning day 1 of each chemotherapy cycle and repeating daily for 7 days  I will see her again 08/20/2018 for cycle 2.  I will see her on day 8 cycle 2 as a WebEx visit.  She knows to call for any other issues that may  develop before the next visit here.   Magrinat, Virgie Dad, MD  08/14/18 1:57 PM Medical Oncology and Hematology.gcmend  [facility] [place], [place] [address] Tel. [phone]    Fax. [phone]    I, Wilburn Mylar, am acting as scribe for Dr. Virgie Dad. Magrinat.  Lindie Spruce MD, have reviewed the  above documentation for accuracy and completeness, and I agree with the above.

## 2018-08-13 NOTE — Progress Notes (Signed)
HPI:  [name] was previously seen in the [place] clinic due to a personal history of cancer and concerns regarding a hereditary predisposition to cancer. Please refer to our prior cancer genetics clinic note for more information regarding our discussion, assessment and recommendations, at the time. [name] recent genetic test results were disclosed to her, as were recommendations warranted by these results. These results and recommendations are discussed in more detail below.  CANCER HISTORY:    Malignant neoplasm of overlapping sites of right breast in female, estrogen receptor positive ([place])   07/23/2018 Initial Diagnosis    Malignant neoplasm of overlapping sites of right breast in female, estrogen receptor positive ([place])    08/06/2018 Genetic Testing    Negative testing (no pathogenic variants identified) on the [facility] Breast [facility] cancers panel. The Breast + GYN Cancers Panel offered by [facility] includes sequencing and/or deletion duplication testing of the following 23 genes: ATM, BARD1, BRCA1, BRCA2, BRIP1, CDH1, CHEK2, DICER1, EPCAM, MLH1, MSH2, MSH6, NBN, NF1, PALB2, PMS2, PTEN, RAD50, RAD51C, RAD51D, SMARCA4, STK11, TP53. The report date is 08/06/2018.     08/07/2018 -  Chemotherapy    The patient had DOXOrubicin (ADRIAMYCIN) chemo injection 124 mg, 60 mg/m2 = 124 mg, Intravenous,  Once, 1 of 4 cycles Administration: 124 mg (08/07/2018) palonosetron (ALOXI) injection 0.25 mg, 0.25 mg, Intravenous,  Once, 1 of 16 cycles Administration: 0.25 mg (08/07/2018) PACLitaxel-protein bound (ABRAXANE) chemo infusion 200 mg, 100 mg/m2 = 200 mg (100 % of original dose 100 mg/m2), Intravenous,  Once, 0 of 12 cycles Dose modification: 100 mg/m2 (original dose 100 mg/m2, Cycle 5) pegfilgrastim (NEULASTA ONPRO KIT) injection 6 mg, 6 mg, Subcutaneous, Once, 1 of 4 cycles Administration: 6 mg (08/07/2018) CARBOplatin (PARAPLATIN) 210 mg in sodium chloride 0.9 % 100 mL chemo infusion, 210  mg (original dose ), Intravenous,  Once, 0 of 12 cycles Dose modification:   (Cycle 5) cyclophosphamide (CYTOXAN) 1,240 mg in sodium chloride 0.9 % 250 mL chemo infusion, 600 mg/m2 = 1,240 mg, Intravenous,  Once, 1 of 4 cycles Administration: 1,240 mg (08/07/2018) fosaprepitant (EMEND) 150 mg, dexamethasone (DECADRON) 12 mg in sodium chloride 0.9 % 145 mL IVPB, , Intravenous,  Once, 1 of 4 cycles Administration:  (08/07/2018)  for chemotherapy treatment.      FAMILY HISTORY:  We obtained a detailed, 4-generation family history.  Significant diagnoses are listed below: Family History  Problem Relation Age of Onset  . Hypertension Father   . Cancer Father 20       lung cancer  . Stroke Father   . Heart disease Mother        arrhythmia  . Hypertension Mother   . Kidney failure Sister    [name] has 4 children: a daughter age 60, a daughter age 19, a son age 14 and a son age 58. No cancers for her children. She has 3 brothers and 2 sisters, No cancers for any of her siblings or nieces/nephews.  [name] mother is living at 20. [name] has 1 maternal uncle, 2 maternal aunts, no cancers. No cancers in her maternal cousins. She has no information about her maternal grandfather. Her maternal grandmother died at 38 from pneumonia. [name] also reports that her grandmother's cousins's son had pancreatic cancer.  [name] father died at 33 of lung cancer, he was a heavy smoker. [name] has 1 maternal uncle who passed away from cancer, unknown type, and a maternal aunt who she has  no information about. She has limited information about paternal cousins but does not believe there is any cancer. She has no information about her paternal grandparents.   [name] is unaware of previous family history of genetic testing for hereditary cancer risks. There is no reported Ashkenazi Jewish ancestry. There is no known consanguinity.  GENETIC TEST RESULTS: Genetic testing reported out on  08/06/2018 through the [facility] cancer panel found no pathogenic mutations.   The Breast and GYN Cancers Panel offered by [facility] includes sequencing and/or deletion duplication testing of the following 23 genes: ATM, BARD1, BRCA1, BRCA2, BRIP1, CDH1, CHEK2, DICER1, EPCAM, MLH1, MSH2, MSH6, NBN, NF1, PALB2, PMS2, PTEN, RAD50, RAD51C, RAD51D, SMARCA4, STK11, TP53.  The test report has been scanned into EPIC and is located under the Molecular Pathology section of the Results Review tab.  A portion of the result report is included below for reference.      We discussed with [name] that because current genetic testing is not perfect, it is possible there may be a gene mutation in one of these genes that current testing cannot detect, but that chance is small.  We also discussed, that there could be another gene that has not yet been discovered, or that we have not yet tested, that is responsible for the cancer diagnoses in the family. It is also possible there is a hereditary cause for the cancer in the family that [name] did not inherit and therefore was not identified in her testing.  Therefore, it is important to remain in touch with cancer genetics in the future so that we can continue to offer [name] the most up to date genetic testing.   ADDITIONAL GENETIC TESTING: We discussed with [name] that her genetic testing was fairly extensive.  If there are genes identified to increase cancer risk that can be analyzed in the future, we would be happy to discuss and coordinate this testing at that time.    CANCER SCREENING RECOMMENDATIONS: [name] test result is considered negative (normal).  This means that we have not identified a hereditary cause for her  personal and family history of cancer at this time. Most cancers happen by chance and this negative test suggests that her cancer may fall into this category.    While reassuring, this does not definitively rule out a  hereditary predisposition to cancer. It is still possible that there could be genetic mutations that are undetectable by current technology. There could be genetic mutations in genes that have not been tested or identified to increase cancer risk.  Therefore, it is recommended she continue to follow the cancer management and screening guidelines provided by her oncology and primary healthcare provider.   An individual's cancer risk and medical management are not determined by genetic test results alone. Overall cancer risk assessment incorporates additional factors, including personal medical history, family history, and any available genetic information that may result in a personalized plan for cancer prevention and surveillance  RECOMMENDATIONS FOR FAMILY MEMBERS:  Relatives in this family might be at some increased risk of developing cancer, over the general population risk, simply due to the family history of cancer.  We recommended women in this family have a yearly mammogram beginning at age 34, or 78 years younger than the earliest onset of cancer, an annual clinical breast exam, and perform monthly breast self-exams. Women in this family should also have a gynecological exam as recommended by their primary provider. All family  members should have a colonoscopy by age 59.  It is also possible there is a hereditary cause for the cancer in [name]'s family that she did not inherit and therefore was not identified in her.  Based on [name]'s family history, we recommended her grandmother's cousin's son, who was diagnosed with pancreatic cancer, have genetic counseling and testing if he has not already done so. [name] will let us know if we can be of any assistance in coordinating genetic counseling and/or testing for this family member.   FOLLOW-UP: Lastly, we discussed with [name] that cancer genetics is a rapidly advancing field and it is possible that new genetic tests will be  appropriate for her and/or her family members in the future. We encouraged her to remain in contact with cancer genetics on an annual basis so we can update her personal and family histories and let her know of advances in cancer genetics that may benefit this family.   Our contact number was provided. [name] questions were answered to her satisfaction, and she knows she is welcome to call us at anytime with additional questions or concerns.   Faith Rogue, MS Genetic Counselor [email or web address] Phone: [phone]

## 2018-08-13 NOTE — Telephone Encounter (Signed)
Revealed negative genetic testing. This normal result is reassuring and indicates that it is unlikely Autumn Cook's cancer is due to a hereditary cause.  It is unlikely that there is an increased risk of another cancer due to a mutation in one of these genes.  However, genetic testing is not perfect, and cannot definitively rule out a hereditary cause.  It will be important for her to keep in contact with genetics to learn if any additional testing may be needed in the future.

## 2018-08-14 ENCOUNTER — Inpatient Hospital Stay (HOSPITAL_BASED_OUTPATIENT_CLINIC_OR_DEPARTMENT_OTHER): Payer: Managed Care, Other (non HMO) | Admitting: Oncology

## 2018-08-14 ENCOUNTER — Other Ambulatory Visit: Payer: Self-pay

## 2018-08-14 ENCOUNTER — Inpatient Hospital Stay: Payer: Managed Care, Other (non HMO)

## 2018-08-14 VITALS — BP 119/76 | HR 81 | Temp 98.4°F | Resp 18 | Ht 66.0 in | Wt 188.6 lb

## 2018-08-14 DIAGNOSIS — Z171 Estrogen receptor negative status [ER-]: Secondary | ICD-10-CM | POA: Diagnosis not present

## 2018-08-14 DIAGNOSIS — C50811 Malignant neoplasm of overlapping sites of right female breast: Secondary | ICD-10-CM | POA: Diagnosis not present

## 2018-08-14 DIAGNOSIS — Z79899 Other long term (current) drug therapy: Secondary | ICD-10-CM

## 2018-08-14 DIAGNOSIS — R5383 Other fatigue: Secondary | ICD-10-CM

## 2018-08-14 DIAGNOSIS — Z801 Family history of malignant neoplasm of trachea, bronchus and lung: Secondary | ICD-10-CM

## 2018-08-14 DIAGNOSIS — IMO0001 Reserved for inherently not codable concepts without codable children: Secondary | ICD-10-CM

## 2018-08-14 DIAGNOSIS — R531 Weakness: Secondary | ICD-10-CM

## 2018-08-14 DIAGNOSIS — Z170 Estrogen receptor positive status [ER+]: Principal | ICD-10-CM

## 2018-08-14 DIAGNOSIS — R05 Cough: Secondary | ICD-10-CM

## 2018-08-14 DIAGNOSIS — R110 Nausea: Secondary | ICD-10-CM

## 2018-08-14 DIAGNOSIS — Z7689 Persons encountering health services in other specified circumstances: Secondary | ICD-10-CM | POA: Diagnosis not present

## 2018-08-14 DIAGNOSIS — J45909 Unspecified asthma, uncomplicated: Secondary | ICD-10-CM

## 2018-08-14 DIAGNOSIS — E1165 Type 2 diabetes mellitus with hyperglycemia: Secondary | ICD-10-CM

## 2018-08-14 DIAGNOSIS — Z5111 Encounter for antineoplastic chemotherapy: Secondary | ICD-10-CM

## 2018-08-14 DIAGNOSIS — M129 Arthropathy, unspecified: Secondary | ICD-10-CM

## 2018-08-14 DIAGNOSIS — Z87891 Personal history of nicotine dependence: Secondary | ICD-10-CM

## 2018-08-14 DIAGNOSIS — Z794 Long term (current) use of insulin: Secondary | ICD-10-CM

## 2018-08-14 DIAGNOSIS — R51 Headache: Secondary | ICD-10-CM

## 2018-08-14 LAB — CBC WITH DIFFERENTIAL/PLATELET
Abs Immature Granulocytes: 0 10*3/uL (ref 0.00–0.07)
Basophils Absolute: 0 10*3/uL (ref 0.0–0.1)
Basophils Relative: 2 %
Eosinophils Absolute: 0.1 10*3/uL (ref 0.0–0.5)
Eosinophils Relative: 6 %
HCT: 34 % — ABNORMAL LOW (ref 36.0–46.0)
Hemoglobin: 11.2 g/dL — ABNORMAL LOW (ref 12.0–15.0)
Immature Granulocytes: 0 %
Lymphocytes Relative: 77 %
Lymphs Abs: 0.8 10*3/uL (ref 0.7–4.0)
MCH: 28.7 pg (ref 26.0–34.0)
MCHC: 32.9 g/dL (ref 30.0–36.0)
MCV: 87.2 fL (ref 80.0–100.0)
Monocytes Absolute: 0.1 10*3/uL (ref 0.1–1.0)
Monocytes Relative: 7 %
Neutro Abs: 0.1 10*3/uL — CL (ref 1.7–7.7)
Neutrophils Relative %: 8 %
Platelets: 169 10*3/uL (ref 150–400)
RBC: 3.9 MIL/uL (ref 3.87–5.11)
RDW: 12.9 % (ref 11.5–15.5)
WBC: 1 10*3/uL — ABNORMAL LOW (ref 4.0–10.5)
nRBC: 0 % (ref 0.0–0.2)

## 2018-08-14 LAB — COMPREHENSIVE METABOLIC PANEL
ALT: 10 U/L (ref 0–44)
AST: 9 U/L — ABNORMAL LOW (ref 15–41)
Albumin: 3.7 g/dL (ref 3.5–5.0)
Alkaline Phosphatase: 72 U/L (ref 38–126)
Anion gap: 11 (ref 5–15)
BUN: 14 mg/dL (ref 6–20)
CO2: 29 mmol/L (ref 22–32)
Calcium: 9.8 mg/dL (ref 8.9–10.3)
Chloride: 98 mmol/L (ref 98–111)
Creatinine, Ser: 0.99 mg/dL (ref 0.44–1.00)
GFR calc Af Amer: >60 mL/min (ref >60)
GFR calc non Af Amer: >60 mL/min (ref >60)
Glucose, Bld: 246 mg/dL — ABNORMAL HIGH (ref 70–99)
Potassium: 3.4 mmol/L — ABNORMAL LOW (ref 3.5–5.1)
Sodium: 138 mmol/L (ref 135–145)
Total Bilirubin: 0.6 mg/dL (ref 0.3–1.2)
Total Protein: 7.2 g/dL (ref 6.5–8.1)

## 2018-08-14 MED ORDER — CIPROFLOXACIN HCL 500 MG PO TABS: 500.0000 mg | ORAL_TABLET | Freq: Two times a day (BID) | ORAL | 0 refills | Status: DC

## 2018-08-14 MED ORDER — DEXAMETHASONE 4 MG PO TABS: 4.0000 mg | ORAL_TABLET | Freq: Two times a day (BID) | ORAL | 1 refills | Status: DC

## 2018-08-14 MED ORDER — OMEPRAZOLE 40 MG PO CPDR: 40.0000 mg | DELAYED_RELEASE_CAPSULE | Freq: Every day | ORAL | 1 refills | Status: DC

## 2018-08-14 MED ORDER — ONDANSETRON HCL 8 MG PO TABS: 8.0000 mg | ORAL_TABLET | Freq: Three times a day (TID) | ORAL | 2 refills | Status: DC | PRN

## 2018-08-15 ENCOUNTER — Ambulatory Visit: Payer: Managed Care, Other (non HMO) | Admitting: Oncology

## 2018-08-15 ENCOUNTER — Encounter: Payer: Self-pay | Admitting: Oncology

## 2018-08-15 MED ORDER — MAGIC MOUTHWASH: 5.0000 mL | Freq: Four times a day (QID) | ORAL | 3 refills | Status: DC | PRN

## 2018-08-16 ENCOUNTER — Encounter: Payer: Self-pay | Admitting: Oncology

## 2018-08-19 NOTE — Progress Notes (Signed)
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  She continues on neoadjuvant chemotherapy will consist of doxorubicin and cyclophosphamide in dose dense fashion x4 followed by weekly Abraxane and carboplatin x12. Today is day 1 cycle 2.   Since her last visit here, she has not undergone any additional studies.     REVIEW OF SYSTEMS: Autumn Cook is still feeling tired, but "a lot better".  She feels a little bit weak overall.  She had a temperature of 100.3 at one point, with chills.  She took Tylenol for that.  Her temperature has been in the 98-99 range since in the evening.  She did take ciprofloxacin for 5 days completing the 5 days last night.  She has no side effects from that.  She has had diarrhea 2 or 3 times daily for the last couple of days.  She also developed thrush with her steroids and was given a mouthwash for that.  That problem has cleared.  Aside from these issues a detailed review of systems today was noncontributory.   HISTORY OF CURRENT ILLNESS: From the original intake note:  Autumn Cook had routine screening mammography on 07/01/2018 showing a possible abnormality in the right breast; she notes that did feel some pain and  a lump in her breast leading up to the routine screening. She underwent right breast ultrasonography at [facility] on 07/05/2018 showing: A 1.4 x 1.2 x 1.4 cm oval hypoechoic mass at the 5:30 position 8 cm from the nipple. A 0.9 x 1 x 1 cm slightly irregular hypoechoic mass at the 10:30 position 9 cm from the nipple. A 3 x 1.9 x 3 cm probable lymph node without fatty hilum in the LOWER RIGHT axilla. A RIGHT axillary lymph node with slightly thickened cortex.  Accordingly on 07/10/2018 she proceeded to biopsy of the right breast area in question. The pathology from this procedure showed ([id number]): invasive ductal carcinoma, high grade, 5:30 o'clock. Prognostic indicators significant for: estrogen receptor, 30% positive with weak staining intensity and progesterone receptor, 0% negative. Proliferation marker Ki67 at 70%.   An additional biopsy was performed on the same day ([id number]) showing:  2. Breast, right, needle core biopsy, 10 o'clock - Invasive carcinoma  Finally, an additional biopsy was performed at the right axilla on the same day ([id number]) showing: invasive carcinoma, right axilla. Prognostic indicators significant for: estrogen receptor, 70% positive, with weak staining intensity and progesterone receptor, 0% negative. Proliferation marker Ki67 at 70%. HER2 positive by immunohistochemistry.    The patient's subsequent history is as detailed below.    PAST MEDICAL HISTORY: Past Medical History:  Diagnosis Date  . Allergy   . Arthritis   . Asthma   . Cancer [facility])    R breast cancer  . Diabetes mellitus without complication ([place])    uncontrolled  . Family history of adverse  reaction to anesthesia    "entire family takes a long time to wake up- N&V for several days"  . Family history of lung cancer   . Hypertension   . Migraine   . PONV (postoperative nausea and vomiting)      PAST SURGICAL HISTORY: Past Surgical History:  Procedure Laterality Date  . BREAST  CYST EXCISION  1994  . DILATION AND CURETTAGE OF UTERUS    . PORTACATH PLACEMENT Right 08/01/2018   Procedure: INSERTION PORT-A-CATH WITH ULTRASOUND;  Surgeon: Alphonsa Overall, MD;  Location: [place];  Service: General;  Laterality: Right;     FAMILY HISTORY: Family History  Problem Relation Age of Onset  . Hypertension Father   . Cancer Father 51       lung cancer  . Stroke Father   . Heart disease Mother        arrhythmia  . Hypertension Mother   . Kidney failure Sister    Autumn Cook's father died from lung cancer at age 46; he was a heavy smoker. Patients' mother is 69 as of 07/2018. The patient has 3 brothers and 2 sisters. Patient denies anyone in her family having breast, ovarian, prostate, or pancreatic cancer. Autumn Cook's maternal great great uncle was diagnosed with lung cancer.     GYNECOLOGIC HISTORY:  Patient's last menstrual period was 11/14/2013. 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 Her husband, Autumn Cook, is a Administrator. At home with them is two toy poodles. [name] has 4 children, Autumn Cook, Autumn Cook, and Autumn Cook. Autumn Cook is 72, lives in [place], and works in Science writer. Autumn Cook is 5, lives in [place], and works in Engineer, technical sales. Autumn Cook is 31, is taking care of Autumn Cook, and works as a Control and instrumentation engineer. Autumn Cook is 68, lives in [place], and is a Buyer, retail.   ADVANCED DIRECTIVES: In the absence of any documentation, [name]'s spouse, Autumn Cook, is her healthcare power of attorney.     HEALTH MAINTENANCE: Social History   Tobacco Use  . Smoking status: Former Smoker    Packs/day: 1.00    Years: 5.00    Pack years: 5.00    Last attempt to quit: 05/08/1988    Years since quitting: 30.3  . Smokeless tobacco: Never Used   Substance Use Topics  . Alcohol use: No  . Drug use: No    Colonoscopy: yes, 2010 - due 2020.  PAP: 2017  Bone density: yes, 2015, normal Mammography: 07/01/2018   Allergies  Allergen Reactions  . Penicillins Hives    Did it involve swelling of the face/tongue/throat, SOB, or low BP? Yes--syncope episode Did it involve sudden or severe rash/hives, skin peeling, or any reaction on the inside of your mouth or nose? No Did you need to seek medical attention at a hospital or doctor's office? Yes When did it last happen? 37-38 YEARS AGO If all above answers are "NO", may proceed with cephalosporin use.   . Metformin And Related Diarrhea  . Adhesive [Tape] Other (See Comments)    "severe bruising"  ALSO SKIN GLUE  . Asa [Aspirin] Other (See Comments)    GI upset     Current Outpatient Medications  Medication Sig Dispense Refill  . albuterol (PROVENTIL HFA;VENTOLIN HFA) 108 (90 Base) MCG/ACT inhaler  Inhale 2 puffs into the lungs every 4 (four) hours as needed for wheezing or shortness of breath (cough, shortness of breath or wheezing.). 1 Inhaler 1  . atenolol (TENORMIN) 100 MG tablet TAKE 1 TABLET BY MOUTH EVERY DAY (Patient taking differently: Take 100 mg by mouth every evening. ) 30 tablet 5  . atorvastatin (LIPITOR) 20 MG tablet Take 1 tablet (20 mg total) by mouth daily. 90 tablet 3  . blood glucose meter kit and supplies KIT Dispense based on patient and insurance preference. Use up to four times daily as directed. (FOR ICD-9 250.00, 250.01). 1 each 0  . ciprofloxacin (CIPRO) 500 MG tablet Take 1 tablet (500 mg total) by mouth 2 (two) times daily. 60 tablet 0  . dapagliflozin propanediol (FARXIGA) 5 MG TABS tablet Take 5 mg by mouth daily. 30 tablet 6  . dexamethasone (DECADRON) 4 MG tablet Take 1 tablet (4 mg total) by mouth 2 (two) times daily. Take 2 tablets by mouth once a day on the day after chemotherapy and then take 2 tablets two times a day for 2 days. Take with  food. 30 tablet 1  . fluconazole (DIFLUCAN) 100 MG tablet Take 1 tablet (100 mg total) by mouth daily. 15 tablet 1  . glipiZIDE (GLUCOTROL) 5 MG tablet Start with 5 mg once a day, then increase to twice a day after 1 week.  Continue to increase as directed by MD 120 tablet 3  . glucose blood (ACCU-CHEK ACTIVE STRIPS) test strip Use as instructed- check blood sugar up to 2x a day 100 each 12  . HYDROcodone-acetaminophen (NORCO/VICODIN) 5-325 MG tablet Take 1 tablet by mouth every 6 (six) hours as needed for moderate pain. 10 tablet 0  . insulin aspart (NOVOLOG FLEXPEN) 100 UNIT/ML FlexPen If glucose 250-300 give 4 units. If glucose 301- 350 give 6 units, if glucose 351- 400 give 8 units.  If higher than 400 give 10 units.   May repeat in one hour if glucose still over 400. 15 mL 3  . Insulin Glargine (BASAGLAR KWIKPEN) 100 UNIT/ML SOPN INJECT 14 UNITS SUBCUTANEOUSLY DAILY (Patient taking differently: Inject 16 Units into the skin daily. ) 15 mL 3  . Insulin Pen Needle (PEN NEEDLES) 32G X 4 MM MISC 1 each by Does not apply route daily. 100 each 3  . Insulin Pen Needle (PEN NEEDLES) 32G X 4 MM MISC 1 each by Does not apply route daily. 100 each 3  . lidocaine-prilocaine (EMLA) cream Apply to affected area once 30 g 3  . lisinopril-hydrochlorothiazide (ZESTORETIC) 20-12.5 MG tablet Take 2 tablets by mouth daily. (Patient taking differently: Take 2 tablets by mouth every evening. ) 180 tablet 3  . loratadine (CLARITIN) 10 MG tablet Take 1 tablet (10 mg total) by mouth daily.    Marland Kitchen LORazepam (ATIVAN) 0.5 MG tablet Take 1 tablet (0.5 mg total) by mouth at bedtime as needed (Nausea or vomiting). 30 tablet 0  . magic mouthwash SOLN Take 5 mLs by mouth 4 (four) times daily as needed for mouth pain. 180 mL 3  . omeprazole (PRILOSEC) 40 MG capsule Take 1 capsule (40 mg total) by mouth daily. 60 capsule 1  . ondansetron (ZOFRAN) 8 MG tablet Take 1 tablet (8 mg total) by mouth every 8 (eight) hours as needed for  nausea or vomiting. 30 tablet 2  . ONE TOUCH ULTRA TEST test strip Check sugar 2 times daily 100 each prn  . ONETOUCH DELICA LANCETS 22L MISC Check sugar  2 times daily 100 each prn  . potassium chloride SA (K-DUR,KLOR-CON) 20 MEQ tablet Take 1 tablet (20 mEq total) by mouth 2 (two) times daily. 20 tablet 1  . prochlorperazine (COMPAZINE) 10 MG tablet Take 1 tablet (10 mg total) by mouth every 6 (six) hours as needed (Nausea or vomiting). 30 tablet 1  . Vitamin D, Ergocalciferol, (DRISDOL) 1.25 MG (50000 UT) CAPS capsule TAKE 1 CAPSULE BY MOUTH EVERY 7 DAYS (Patient taking differently: Take 50,000 Units by mouth every Saturday. ) 12 capsule 1   No current facility-administered medications for this visit.    Facility-Administered Medications Ordered in Other Visits  Medication Dose Route Frequency Provider Last Rate Last Dose  . 0.9 %  sodium chloride infusion   Intravenous Once Magrinat, Virgie Dad, MD      . cyclophosphamide (CYTOXAN) 1,020 mg in sodium chloride 0.9 % 250 mL chemo infusion  500 mg/m2 (Treatment Plan Recorded) Intravenous Once Magrinat, Virgie Dad, MD      . DOXOrubicin (ADRIAMYCIN) chemo injection 102 mg  50 mg/m2 (Treatment Plan Recorded) Intravenous Once Magrinat, Virgie Dad, MD      . fosaprepitant (EMEND) 150 mg, dexamethasone (DECADRON) 4 mg in sodium chloride 0.9 % 145 mL IVPB   Intravenous Once Magrinat, Virgie Dad, MD      . heparin lock flush 100 unit/mL  500 Units Intracatheter Once PRN Magrinat, Virgie Dad, MD      . palonosetron (ALOXI) injection 0.25 mg  0.25 mg Intravenous Once Magrinat, Virgie Dad, MD      . pegfilgrastim (NEULASTA ONPRO KIT) injection 6 mg  6 mg Subcutaneous Once Magrinat, Virgie Dad, MD      . sodium chloride flush (NS) 0.9 % injection 10 mL  10 mL Intracatheter PRN Magrinat, Virgie Dad, MD         OBJECTIVE: Middle-aged African-American woman examined in the treatment area  There were no vitals filed for this visit.   There is no height or weight on file to  calculate BMI.   Wt Readings from Last 3 Encounters:  08/20/18 191 lb 12 oz (87 kg)  08/14/18 188 lb 9.6 oz (85.5 kg)  08/02/18 196 lb 8 oz (89.1 kg)   For vitals today please consult the infusion area flowsheet   ECOG FS:1 - Symptomatic but completely ambulatory  Sclerae unicteric, EOMs intact Wearing a mask No cervical or supraclavicular adenopathy Lungs no rales or rhonchi Heart regular rate and rhythm Abd soft, nontender, positive bowel sounds Neuro: nonfocal, well oriented, appropriate affect Breasts: Deferred   LAB RESULTS:  CMP     Component Value Date/Time   NA 139 08/20/2018 0834   K 2.7 (LL) 08/20/2018 0834   CL 102 08/20/2018 0834   CO2 23 08/20/2018 0834   GLUCOSE 217 (H) 08/20/2018 0834   BUN 11 08/20/2018 0834   CREATININE 0.88 08/20/2018 0834   CREATININE 1.14 (H) 07/24/2018 0825   CREATININE 0.70 04/14/2015 1558   CALCIUM 8.7 (L) 08/20/2018 0834   PROT 6.9 08/20/2018 0834   ALBUMIN 3.4 (L) 08/20/2018 0834   AST 18 08/20/2018 0834   AST 12 (L) 07/24/2018 0825   ALT 13 08/20/2018 0834   ALT 15 07/24/2018 0825   ALKPHOS 95 08/20/2018 0834   BILITOT <0.2 (L) 08/20/2018 0834   BILITOT 0.5 07/24/2018 0825   GFRNONAA >60 08/20/2018 0834   GFRNONAA 54 (L) 07/24/2018 0825   GFRNONAA 87 03/27/2014 1130   GFRAA >60 08/20/2018 0834   GFRAA >60 07/24/2018  0825   GFRAA >89 03/27/2014 1130    No results found for: TOTALPROTELP, ALBUMINELP, A1GS, A2GS, BETS, BETA2SER, GAMS, MSPIKE, SPEI  No results found for: KPAFRELGTCHN, LAMBDASER, KAPLAMBRATIO  Lab Results  Component Value Date   WBC 18.5 (H) 08/20/2018   NEUTROABS 11.8 (H) 08/20/2018   HGB 10.6 (L) 08/20/2018   HCT 32.3 (L) 08/20/2018   MCV 88.0 08/20/2018   PLT 215 08/20/2018    _0 @  No results found for: LABCA2  No components found for: [id number]  No results for input(s): INR in the last 168 hours.  No results found for: LABCA2  No results found for: OYD741  No results  found for: OIN867  No results found for: EHM094  No results found for: CA2729  No components found for: HGQUANT  No results found for: CEA1 / No results found for: CEA1   No results found for: AFPTUMOR  No results found for: CHROMOGRNA  No results found for: PSA1  Appointment on 08/20/2018  Component Date Value Ref Range Status  . WBC 08/20/2018 18.5* 4.0 - 10.5 K/uL Final  . RBC 08/20/2018 3.67* 3.87 - 5.11 MIL/uL Final  . Hemoglobin 08/20/2018 10.6* 12.0 - 15.0 g/dL Final  . HCT 08/20/2018 32.3* 36.0 - 46.0 % Final  . MCV 08/20/2018 88.0  80.0 - 100.0 fL Final  . MCH 08/20/2018 28.9  26.0 - 34.0 pg Final  . MCHC 08/20/2018 32.8  30.0 - 36.0 g/dL Final  . RDW 08/20/2018 13.5  11.5 - 15.5 % Final  . Platelets 08/20/2018 215  150 - 400 K/uL Final  . nRBC 08/20/2018 0.3* 0.0 - 0.2 % Final  . Neutrophils Relative % 08/20/2018 64  % Final  . Neutro Abs 08/20/2018 11.8* 1.7 - 7.7 K/uL Final  . Lymphocytes Relative 08/20/2018 9  % Final  . Lymphs Abs 08/20/2018 1.7  0.7 - 4.0 K/uL Final  . Monocytes Relative 08/20/2018 8  % Final  . Monocytes Absolute 08/20/2018 1.5* 0.1 - 1.0 K/uL Final  . Eosinophils Relative 08/20/2018 0  % Final  . Eosinophils Absolute 08/20/2018 0.0  0.0 - 0.5 K/uL Final  . Basophils Relative 08/20/2018 0  % Final  . Basophils Absolute 08/20/2018 0.0  0.0 - 0.1 K/uL Final  . Immature Granulocytes 08/20/2018 19  % Final   Increased IG's, likely caused by Bone Marrow Colony Stimulating Factor received within 30 days.  . Abs Immature Granulocytes 08/20/2018 3.43* 0.00 - 0.07 K/uL Final   Performed at [facility], [place] [address], [place], [place] [address]  . Sodium 08/20/2018 139  135 - 145 mmol/L Final  . Potassium 08/20/2018 2.7* 3.5 - 5.1 mmol/L Final   CRITICAL RESULT CALLED TO, READ BACK BY AND VERIFIED WITH: Marlon Pel, RN [phone]    . Chloride 08/20/2018 102  98 - 111 mmol/L Final  . CO2 08/20/2018 23  22 - 32 mmol/L Final  .  Glucose, Bld 08/20/2018 217* 70 - 99 mg/dL Final  . BUN 08/20/2018 11  6 - 20 mg/dL Final  . Creatinine, Ser 08/20/2018 0.88  0.44 - 1.00 mg/dL Final  . Calcium 08/20/2018 8.7* 8.9 - 10.3 mg/dL Final  . Total Protein 08/20/2018 6.9  6.5 - 8.1 g/dL Final  . Albumin 08/20/2018 3.4* 3.5 - 5.0 g/dL Final  . AST 08/20/2018 18  15 - 41 U/L Final  . ALT 08/20/2018 13  0 - 44 U/L Final  . Alkaline Phosphatase 08/20/2018 95  38 - 126  U/L Final  . Total Bilirubin 08/20/2018 <0.2* 0.3 - 1.2 mg/dL Final  . GFR calc non Af Amer 08/20/2018 >60  >60 mL/min Final  . GFR calc Af Amer 08/20/2018 >60  >60 mL/min Final  . Anion gap 08/20/2018 14  5 - 15 Final   Performed at [facility], [address] [place], [place] [address]    (this displays the last labs from the last 3 days)  No results found for: TOTALPROTELP, ALBUMINELP, A1GS, A2GS, BETS, BETA2SER, GAMS, MSPIKE, SPEI (this displays SPEP labs)  No results found for: KPAFRELGTCHN, LAMBDASER, KAPLAMBRATIO (kappa/lambda light chains)  No results found for: HGBA, HGBA2QUANT, HGBFQUANT, HGBSQUAN (Hemoglobinopathy evaluation)   No results found for: LDH  No results found for: IRON, TIBC, IRONPCTSAT (Iron and TIBC)  No results found for: FERRITIN  Urinalysis    Component Value Date/Time   COLORURINE YELLOW 03/07/2016 1350   APPEARANCEUR CLEAR 03/07/2016 1350   LABSPEC 1.031 (H) 03/07/2016 1350   PHURINE 5.0 03/07/2016 1350   GLUCOSEU >1000 (A) 03/07/2016 1350   HGBUR NEGATIVE 03/07/2016 [place] 03/07/2016 1350   BILIRUBINUR neg 11/24/2013 1547   KETONESUR 15 (A) 03/07/2016 1350   PROTEINUR NEGATIVE 03/07/2016 1350   UROBILINOGEN 0.2 11/24/2013 1547   UROBILINOGEN 0.2 11/10/2006 1153   NITRITE NEGATIVE 03/07/2016 [place] 03/07/2016 1350     STUDIES:  Dg Chest Port 1 View  Result Date: 08/01/2018 CLINICAL DATA:  Port-A-Cath placement EXAM: PORTABLE CHEST 1 VIEW  COMPARISON:  August 06, 2014 FINDINGS: Port-A-Cath tip is at the cavoatrial junction. No pneumothorax. There is mild bibasilar atelectasis. The lungs elsewhere are clear. Heart is upper normal in size with pulmonary vascularity normal. No adenopathy. No bone lesions. IMPRESSION: Port-A-Cath tip at cavoatrial junction. No pneumothorax. Bibasilar atelectasis. No edema or consolidation. Heart upper normal in size. Electronically Signed   By: Lowella Grip III M.D.   On: 08/01/2018 09:19   Dg Fluoro Guide Cv Line-no Report  Result Date: 08/01/2018 Fluoroscopy was utilized by the requesting physician.  No radiographic interpretation.     ELIGIBLE FOR AVAILABLE RESEARCH PROTOCOL: No   ASSESSMENT: 58 y.o. [place], [place] woman status post right breast biopsy x2 for multicentric invasive ductal carcinoma, clinically T1c N1, stage IIB, grade 3, functionally triple negative, with an MIB-1 of 70%  (a) right axillary lymph node biopsied at the same time was positive  (1) neoadjuvant chemotherapy will consist of doxorubicin and cyclophosphamide in dose dense fashion x4 followed by weekly Abraxane and carboplatin x12 starting 08/07/2018  (2) definitive surgery to follow  (3) adjuvant radiation as appropriate  (4) genetics testing 08/06/2018 through the Breast + GYN Cancers Panel offered by [facility] found no deleterious mutations in ATM, BARD1, BRCA1, BRCA2, BRIP1, CDH1, CHEK2, DICER1, EPCAM, MLH1, MSH2, MSH6, NBN, NF1, PALB2, PMS2, PTEN, RAD50, RAD51C, RAD51D, SMARCA4, STK11, TP53.     PLAN: Corinda did moderately well with her first cycle of chemotherapy.  She did have thrush and she is going to have with every cycle and therefore I am starting her on Diflucan today to be taken for 5 days.  She is hypokalemic from her diarrhea and I am giving her some runs today and starting her on potassium 20 mEq daily for the next 5 days.  Starting 08/27/2018 she will start ciprofloxacin and she will take that  twice daily for 5 days.  All this information was given to her in writing.  We discussed the  fact that her sugar is not well controlled and she does need to watch her diet and do more walking.  Given these changes I do not see any reason for her to expose herself by coming here today 7.  Instead she will see me again on day 1 cycle 3 on 09/02/2018.  I have asked her to make sure to call us with any other issues that may develop before then.  Magrinat, Virgie Dad, MD  08/20/18 10:09 AM Medical Oncology and Hematology [facility] [address] [place], [place] [address] Tel. [phone]    Fax. [phone]  I, Jacqualyn Posey am acting as a Education administrator for Chauncey Cruel, MD.   I, Lurline Del MD, have reviewed the above documentation for accuracy and completeness, and I agree with the above.

## 2018-08-20 ENCOUNTER — Inpatient Hospital Stay: Payer: Managed Care, Other (non HMO)

## 2018-08-20 ENCOUNTER — Other Ambulatory Visit: Payer: Self-pay

## 2018-08-20 ENCOUNTER — Inpatient Hospital Stay (HOSPITAL_BASED_OUTPATIENT_CLINIC_OR_DEPARTMENT_OTHER): Payer: Managed Care, Other (non HMO) | Admitting: Oncology

## 2018-08-20 ENCOUNTER — Other Ambulatory Visit: Payer: Self-pay | Admitting: *Deleted

## 2018-08-20 VITALS — BP 128/74 | HR 62 | Temp 98.5°F | Resp 16 | Wt 191.8 lb

## 2018-08-20 DIAGNOSIS — M129 Arthropathy, unspecified: Secondary | ICD-10-CM

## 2018-08-20 DIAGNOSIS — Z7689 Persons encountering health services in other specified circumstances: Secondary | ICD-10-CM

## 2018-08-20 DIAGNOSIS — Z170 Estrogen receptor positive status [ER+]: Secondary | ICD-10-CM

## 2018-08-20 DIAGNOSIS — E1165 Type 2 diabetes mellitus with hyperglycemia: Secondary | ICD-10-CM

## 2018-08-20 DIAGNOSIS — Z5111 Encounter for antineoplastic chemotherapy: Secondary | ICD-10-CM | POA: Diagnosis not present

## 2018-08-20 DIAGNOSIS — C50811 Malignant neoplasm of overlapping sites of right female breast: Secondary | ICD-10-CM | POA: Diagnosis not present

## 2018-08-20 DIAGNOSIS — Z95828 Presence of other vascular implants and grafts: Secondary | ICD-10-CM

## 2018-08-20 DIAGNOSIS — Z5112 Encounter for antineoplastic immunotherapy: Secondary | ICD-10-CM

## 2018-08-20 DIAGNOSIS — Z87891 Personal history of nicotine dependence: Secondary | ICD-10-CM

## 2018-08-20 DIAGNOSIS — R197 Diarrhea, unspecified: Secondary | ICD-10-CM

## 2018-08-20 DIAGNOSIS — E876 Hypokalemia: Secondary | ICD-10-CM

## 2018-08-20 DIAGNOSIS — Z79899 Other long term (current) drug therapy: Secondary | ICD-10-CM

## 2018-08-20 DIAGNOSIS — Z171 Estrogen receptor negative status [ER-]: Secondary | ICD-10-CM

## 2018-08-20 DIAGNOSIS — Z794 Long term (current) use of insulin: Secondary | ICD-10-CM

## 2018-08-20 DIAGNOSIS — IMO0001 Reserved for inherently not codable concepts without codable children: Secondary | ICD-10-CM

## 2018-08-20 DIAGNOSIS — Z801 Family history of malignant neoplasm of trachea, bronchus and lung: Secondary | ICD-10-CM

## 2018-08-20 DIAGNOSIS — J45909 Unspecified asthma, uncomplicated: Secondary | ICD-10-CM

## 2018-08-20 LAB — CBC WITH DIFFERENTIAL/PLATELET
Abs Immature Granulocytes: 3.43 10*3/uL — ABNORMAL HIGH (ref 0.00–0.07)
Basophils Absolute: 0 10*3/uL (ref 0.0–0.1)
Basophils Relative: 0 %
Eosinophils Absolute: 0 10*3/uL (ref 0.0–0.5)
Eosinophils Relative: 0 %
HCT: 32.3 % — ABNORMAL LOW (ref 36.0–46.0)
Hemoglobin: 10.6 g/dL — ABNORMAL LOW (ref 12.0–15.0)
Immature Granulocytes: 19 %
Lymphocytes Relative: 9 %
Lymphs Abs: 1.7 10*3/uL (ref 0.7–4.0)
MCH: 28.9 pg (ref 26.0–34.0)
MCHC: 32.8 g/dL (ref 30.0–36.0)
MCV: 88 fL (ref 80.0–100.0)
Monocytes Absolute: 1.5 10*3/uL — ABNORMAL HIGH (ref 0.1–1.0)
Monocytes Relative: 8 %
Neutro Abs: 11.8 10*3/uL — ABNORMAL HIGH (ref 1.7–7.7)
Neutrophils Relative %: 64 %
Platelets: 215 10*3/uL (ref 150–400)
RBC: 3.67 MIL/uL — ABNORMAL LOW (ref 3.87–5.11)
RDW: 13.5 % (ref 11.5–15.5)
WBC: 18.5 10*3/uL — ABNORMAL HIGH (ref 4.0–10.5)
nRBC: 0.3 % — ABNORMAL HIGH (ref 0.0–0.2)

## 2018-08-20 LAB — COMPREHENSIVE METABOLIC PANEL
ALT: 13 U/L (ref 0–44)
AST: 18 U/L (ref 15–41)
Albumin: 3.4 g/dL — ABNORMAL LOW (ref 3.5–5.0)
Alkaline Phosphatase: 95 U/L (ref 38–126)
Anion gap: 14 (ref 5–15)
BUN: 11 mg/dL (ref 6–20)
CO2: 23 mmol/L (ref 22–32)
Calcium: 8.7 mg/dL — ABNORMAL LOW (ref 8.9–10.3)
Chloride: 102 mmol/L (ref 98–111)
Creatinine, Ser: 0.88 mg/dL (ref 0.44–1.00)
GFR calc Af Amer: >60 mL/min (ref >60)
GFR calc non Af Amer: >60 mL/min (ref >60)
Glucose, Bld: 217 mg/dL — ABNORMAL HIGH (ref 70–99)
Potassium: 2.7 mmol/L — CL (ref 3.5–5.1)
Sodium: 139 mmol/L (ref 135–145)
Total Bilirubin: <0.2 mg/dL — ABNORMAL LOW (ref 0.3–1.2)
Total Protein: 6.9 g/dL (ref 6.5–8.1)

## 2018-08-20 MED ORDER — SODIUM CHLORIDE 0.9% FLUSH
10.0000 mL | INTRAVENOUS | Status: DC | PRN
  Administered 2018-08-20: 10 mL
  Filled 2018-08-20: qty 10

## 2018-08-20 MED ORDER — SODIUM CHLORIDE 0.9 % IV SOLN
Freq: Once | INTRAVENOUS | Status: AC
  Administered 2018-08-20: 10:00:00 via INTRAVENOUS
  Filled 2018-08-20: qty 5

## 2018-08-20 MED ORDER — SODIUM CHLORIDE 0.9 % IV SOLN
Freq: Once | INTRAVENOUS | Status: AC
  Administered 2018-08-20: 10:00:00 via INTRAVENOUS
  Filled 2018-08-20: qty 250

## 2018-08-20 MED ORDER — POTASSIUM CHLORIDE CRYS ER 20 MEQ PO TBCR: 20.0000 meq | EXTENDED_RELEASE_TABLET | Freq: Two times a day (BID) | ORAL | 1 refills | Status: DC

## 2018-08-20 MED ORDER — DOXORUBICIN HCL CHEMO IV INJECTION 2 MG/ML
50.0000 mg/m2 | Freq: Once | INTRAVENOUS | Status: AC
  Administered 2018-08-20: 102 mg via INTRAVENOUS
  Filled 2018-08-20: qty 51

## 2018-08-20 MED ORDER — PEGFILGRASTIM 6 MG/0.6ML ~~LOC~~ PSKT
PREFILLED_SYRINGE | SUBCUTANEOUS | Status: AC
  Filled 2018-08-20: qty 0.6

## 2018-08-20 MED ORDER — POTASSIUM CHLORIDE 10 MEQ/100ML IV SOLN
10.0000 meq | INTRAVENOUS | Status: AC
  Administered 2018-08-20 (×2): 10 meq via INTRAVENOUS
  Filled 2018-08-20: qty 100

## 2018-08-20 MED ORDER — PEGFILGRASTIM 6 MG/0.6ML ~~LOC~~ PSKT
6.0000 mg | PREFILLED_SYRINGE | Freq: Once | SUBCUTANEOUS | Status: AC
  Administered 2018-08-20: 6 mg via SUBCUTANEOUS

## 2018-08-20 MED ORDER — HEPARIN SOD (PORK) LOCK FLUSH 100 UNIT/ML IV SOLN
500.0000 [IU] | Freq: Once | INTRAVENOUS | Status: AC | PRN
  Administered 2018-08-20: 500 [IU]
  Filled 2018-08-20: qty 5

## 2018-08-20 MED ORDER — FLUCONAZOLE 100 MG PO TABS: 100.0000 mg | ORAL_TABLET | Freq: Every day | ORAL | 1 refills | Status: DC

## 2018-08-20 MED ORDER — SODIUM CHLORIDE 0.9% FLUSH
10.0000 mL | Freq: Once | INTRAVENOUS | Status: AC
  Administered 2018-08-20: 10 mL
  Filled 2018-08-20: qty 10

## 2018-08-20 MED ORDER — PALONOSETRON HCL INJECTION 0.25 MG/5ML
INTRAVENOUS | Status: AC
  Filled 2018-08-20: qty 5

## 2018-08-20 MED ORDER — PALONOSETRON HCL INJECTION 0.25 MG/5ML
0.2500 mg | Freq: Once | INTRAVENOUS | Status: AC
  Administered 2018-08-20: 0.25 mg via INTRAVENOUS

## 2018-08-20 MED ORDER — SODIUM CHLORIDE 0.9 % IV SOLN
500.0000 mg/m2 | Freq: Once | INTRAVENOUS | Status: AC
  Administered 2018-08-20: 1020 mg via INTRAVENOUS
  Filled 2018-08-20: qty 51

## 2018-08-20 MED ORDER — SODIUM CHLORIDE 0.9 % IV SOLN: INTRAVENOUS | Status: DC

## 2018-08-20 NOTE — Patient Instructions (Signed)
[facility] Discharge Instructions for Patients Receiving Chemotherapy  Today you received the following chemotherapy agents Adriamycin and Cytoxan  To help prevent nausea and vomiting after your treatment, we encourage you to take your nausea medication as directed.  If you develop nausea and vomiting that is not controlled by your nausea medication, call the clinic.   BELOW ARE SYMPTOMS THAT SHOULD BE REPORTED IMMEDIATELY:  *FEVER GREATER THAN 100.5 F  *CHILLS WITH OR WITHOUT FEVER  NAUSEA AND VOMITING THAT IS NOT CONTROLLED WITH YOUR NAUSEA MEDICATION  *UNUSUAL SHORTNESS OF BREATH  *UNUSUAL BRUISING OR BLEEDING  TENDERNESS IN MOUTH AND THROAT WITH OR WITHOUT PRESENCE OF ULCERS  *URINARY PROBLEMS  *BOWEL PROBLEMS  UNUSUAL RASH Items with * indicate a potential emergency and should be followed up as soon as possible.  Feel free to call the clinic should you have any questions or concerns. The clinic phone number is [phone].  Please show the CHEMO ALERT CARD at check-in to the Emergency Department and triage nurse.   

## 2018-08-20 NOTE — Progress Notes (Signed)
Per Dr. Magrinat, ok to treat with current labs.  

## 2018-08-21 ENCOUNTER — Encounter: Payer: Self-pay | Admitting: Oncology

## 2018-08-22 ENCOUNTER — Ambulatory Visit: Payer: Managed Care, Other (non HMO)

## 2018-08-27 ENCOUNTER — Ambulatory Visit: Payer: Managed Care, Other (non HMO) | Admitting: Oncology

## 2018-08-28 ENCOUNTER — Encounter: Payer: Self-pay | Admitting: Oncology

## 2018-08-28 NOTE — Progress Notes (Signed)
I enroll pt in the [place] program forNeulastafor $10,000per calendar yearfrom4/6/20.Pt will pay $0 for herfirstdose or cycleand $85for each subsequent dose or cycle.

## 2018-08-30 ENCOUNTER — Encounter: Payer: Self-pay | Admitting: Family Medicine

## 2018-08-30 MED ORDER — GLUCOSE BLOOD VI STRP: ORAL_STRIP | 12 refills | Status: AC

## 2018-08-30 NOTE — Progress Notes (Signed)
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  She continues on neoadjuvant chemotherapy will consist of doxorubicin and cyclophosphamide in dose dense fashion x4 followed by weekly Abraxane and carboplatin x12. Today is day 1 cycle 3 of AC. She reports continued body aches. She states the decadron made her blood sugar spike to 464, so she stopped taking it. She also reports a diminished appetite, but she finds things to eat and keeps her sugars around 150-160.   She is receiving OnPro for white cell support. She reports one day following the shot, Wednesday or Thursday, of feeling very badly-- body aches, nausea, severe fatigue.  Since her last visit here, she has not undergone any additional studies.     REVIEW OF SYSTEMS: Autumn Cook reports doing well overall. She notes her hair is almost completely gone now. She reports some darkening in her nails. The patient denies unusual headaches, visual changes, nausea, vomiting, stiff neck, dizziness, or gait imbalance. There has been no cough, phlegm production, or pleurisy, no chest pain or pressure, and no change in bowel or bladder habits. The patient denies fever, rash, bleeding, unexplained fatigue or  unexplained weight loss. A detailed review of systems was otherwise entirely negative.  HISTORY OF CURRENT ILLNESS: From the original intake note:  Autumn Cook had routine screening mammography on 07/01/2018 showing a possible abnormality in the right breast; she notes that did feel some pain and a lump in her breast leading up to the routine screening. She underwent right breast ultrasonography at [facility] on 07/05/2018 showing: A 1.4 x 1.2 x 1.4 cm oval hypoechoic mass at the 5:30 position 8 cm from the nipple. A 0.9 x 1 x 1 cm slightly irregular hypoechoic mass at the 10:30 position 9 cm from the nipple. A 3 x 1.9 x 3 cm probable lymph node without fatty hilum in the LOWER RIGHT axilla. A RIGHT axillary lymph node with slightly thickened cortex.  Accordingly on 07/10/2018 she proceeded to biopsy of the right breast area in question. The pathology from this procedure showed ([id number]): invasive ductal carcinoma, high grade, 5:30 o'clock. Prognostic indicators significant for: estrogen receptor, 30% positive with weak staining intensity and progesterone receptor, 0% negative. Proliferation marker Ki67 at 70%.   An additional biopsy was performed on the same day ([id number]) showing:  2. Breast, right, needle core biopsy, 10 o'clock - Invasive carcinoma  Finally, an additional biopsy was performed at the right axilla on the same day ([id number]) showing: invasive carcinoma, right axilla. Prognostic indicators significant for: estrogen receptor, 70% positive, with weak staining intensity and progesterone receptor, 0% negative. Proliferation marker Ki67 at 70%. HER2 positive by immunohistochemistry.    The patient's subsequent history is as detailed below.    PAST MEDICAL HISTORY: Past Medical History:  Diagnosis Date   Allergy    Arthritis    Asthma  Cancer ([place])    R breast cancer   Diabetes mellitus without complication ([place])    uncontrolled   Family history of  adverse reaction to anesthesia    "entire family takes a long time to wake up- N&V for several days"   Family history of lung cancer    Hypertension    Migraine    PONV (postoperative nausea and vomiting)      PAST SURGICAL HISTORY: Past Surgical History:  Procedure Laterality Date   BREAST CYST EXCISION  1994   DILATION AND CURETTAGE OF UTERUS     PORTACATH PLACEMENT Right 08/01/2018   Procedure: INSERTION PORT-A-CATH WITH ULTRASOUND;  Surgeon: Alphonsa Overall, MD;  Location: [facility];  Service: General;  Laterality: Right;     FAMILY HISTORY: Family History  Problem Relation Age of Onset   Hypertension Father    Cancer Father 89       lung cancer   Stroke Father    Heart disease Mother        arrhythmia   Hypertension Mother    Kidney failure Sister    Autumn Cook's father died from lung cancer at age 29; he was a heavy smoker. Patients' mother is 48 as of 07/2018. The patient has 3 brothers and 2 sisters. Patient denies anyone in her family having breast, ovarian, prostate, or pancreatic cancer. Autumn Cook maternal great great uncle was diagnosed with lung cancer.     GYNECOLOGIC HISTORY:  Patient's last menstrual period was 11/14/2013. 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 Her husband, Autumn Cook, is a Administrator. At home with them is two toy poodles. Autumn Cook has 4 children, Autumn Cook, Autumn Cook, and Autumn Cook. Autumn Cook is 62, lives in [place], and works in Science writer. Autumn Cook is 71, lives in [place], and works in Engineer, technical sales. Autumn Cook is 16, is taking care of Autumn Cook, and works as a Control and instrumentation engineer. Autumn Cook is 55, lives in [place], and is a Buyer, retail.   ADVANCED DIRECTIVES: In the absence of any documentation, Autumn Cook's  spouse, Autumn Cook, is her healthcare power of attorney.     HEALTH MAINTENANCE: Social History   Tobacco Use   Smoking status: Former Smoker    Packs/day: 1.00    Years: 5.00    Pack years: 5.00    Last attempt to quit: 05/08/1988    Years since quitting: 30.3   Smokeless tobacco: Never Used  Substance Use Topics   Alcohol use: No   Drug use: No    Colonoscopy: yes, 2010 - due 2020.  PAP: 2017  Bone density: yes, 2015, normal Mammography: 07/01/2018   Allergies  Allergen Reactions   Penicillins Hives    Did it involve swelling of the face/tongue/throat, SOB, or low BP? Yes--syncope episode Did it involve sudden or severe rash/hives, skin peeling, or any reaction on the inside of your mouth or nose? No Did you need to seek medical attention at a hospital or doctor's office? Yes When did it last happen? 37-38 YEARS AGO If all above answers are "NO", may proceed with cephalosporin use.    Metformin And Related Diarrhea   Adhesive [Tape] Other (See Comments)    "severe bruising"  ALSO SKIN GLUE   Asa [Aspirin] Other (See Comments)  GI upset     Current Outpatient Medications  Medication Sig Dispense Refill   albuterol (PROVENTIL HFA;VENTOLIN HFA) 108 (90 Base) MCG/ACT inhaler Inhale 2 puffs into the lungs every 4 (four) hours as needed for wheezing or shortness of breath (cough, shortness of breath or wheezing.). 1 Inhaler 1   atenolol (TENORMIN) 100 MG tablet TAKE 1 TABLET BY MOUTH EVERY DAY (Patient taking differently: Take 100 mg by mouth every evening. ) 30 tablet 5   atorvastatin (LIPITOR) 20 MG tablet Take 1 tablet (20 mg total) by mouth daily. 90 tablet 3   blood glucose meter kit and supplies KIT Dispense based on patient and insurance preference. Use up to four times daily as directed. (FOR ICD-9 250.00, 250.01). 1 each 0   ciprofloxacin (CIPRO) 500 MG tablet Take 1 tablet (500 mg total) by mouth 2 (two) times daily. 60 tablet 0   dapagliflozin  propanediol (FARXIGA) 5 MG TABS tablet Take 5 mg by mouth daily. 30 tablet 6   fluconazole (DIFLUCAN) 100 MG tablet Take 1 tablet (100 mg total) by mouth daily. 15 tablet 1   glipiZIDE (GLUCOTROL) 5 MG tablet Start with 5 mg once a day, then increase to twice a day after 1 week.  Continue to increase as directed by MD 120 tablet 3   glucose blood (ACCU-CHEK ACTIVE STRIPS) test strip Use as instructed- check blood sugar up to 2x a day 100 each 12   HYDROcodone-acetaminophen (NORCO/VICODIN) 5-325 MG tablet Take 1 tablet by mouth every 6 (six) hours as needed for moderate pain. 10 tablet 0   insulin aspart (NOVOLOG FLEXPEN) 100 UNIT/ML FlexPen If glucose 250-300 give 4 units. If glucose 301- 350 give 6 units, if glucose 351- 400 give 8 units.  If higher than 400 give 10 units.   May repeat in one hour if glucose still over 400. 15 mL 3   Insulin Glargine (BASAGLAR KWIKPEN) 100 UNIT/ML SOPN INJECT 14 UNITS SUBCUTANEOUSLY DAILY (Patient taking differently: Inject 16 Units into the skin daily. ) 15 mL 3   Insulin Pen Needle (PEN NEEDLES) 32G X 4 MM MISC 1 each by Does not apply route daily. 100 each 3   Insulin Pen Needle (PEN NEEDLES) 32G X 4 MM MISC 1 each by Does not apply route daily. 100 each 3   lidocaine-prilocaine (EMLA) cream Apply to affected area once 30 g 3   lisinopril-hydrochlorothiazide (ZESTORETIC) 20-12.5 MG tablet Take 2 tablets by mouth daily. (Patient taking differently: Take 2 tablets by mouth every evening. ) 180 tablet 3   loratadine (CLARITIN) 10 MG tablet Take 1 tablet (10 mg total) by mouth daily.     LORazepam (ATIVAN) 0.5 MG tablet Take 1 tablet (0.5 mg total) by mouth at bedtime as needed (Nausea or vomiting). 30 tablet 0   magic mouthwash SOLN Take 5 mLs by mouth 4 (four) times daily as needed for mouth pain. 180 mL 3   omeprazole (PRILOSEC) 40 MG capsule Take 1 capsule (40 mg total) by mouth daily. 60 capsule 1   ondansetron (ZOFRAN) 8 MG tablet Take 1 tablet (8  mg total) by mouth every 8 (eight) hours as needed for nausea or vomiting. 30 tablet 2   ondansetron (ZOFRAN) 8 MG tablet Take 1 tablet (8 mg total) by mouth every 8 (eight) hours as needed for nausea or vomiting. 20 tablet 0   ONE TOUCH ULTRA TEST test strip Check sugar 2 times daily 100 each prn   ONETOUCH DELICA LANCETS 09N  MISC Check sugar 2 times daily 100 each prn   potassium chloride SA (K-DUR,KLOR-CON) 20 MEQ tablet Take 1 tablet (20 mEq total) by mouth 2 (two) times daily. 20 tablet 1   prochlorperazine (COMPAZINE) 10 MG tablet Take 1 tablet (10 mg total) by mouth every 6 (six) hours as needed (Nausea or vomiting). 30 tablet 1   Vitamin D, Ergocalciferol, (DRISDOL) 1.25 MG (50000 UT) CAPS capsule TAKE 1 CAPSULE BY MOUTH EVERY 7 DAYS (Patient taking differently: Take 50,000 Units by mouth every Saturday. ) 12 capsule 1   No current facility-administered medications for this visit.      OBJECTIVE: Middle-aged African-American woman who appears stated age  58:   09/02/18 0908  BP: (!) 160/75  Pulse: 80  Resp: 18  Temp: 99 F (37.2 C)  SpO2: 99%     Body mass index is 30.26 kg/m.   Wt Readings from Last 3 Encounters:  09/02/18 187 lb 8 oz (85 kg)  08/20/18 191 lb 12 oz (87 kg)  08/14/18 188 lb 9.6 oz (85.5 kg)      ECOG FS:1 - Symptomatic but completely ambulatory  Sclerae unicteric, EOMs intact Oropharynx clear and moist No cervical or supraclavicular adenopathy Lungs no rales or rhonchi Heart regular rate and rhythm Abd soft, nontender, positive bowel sounds MSK no focal spinal tenderness, no upper extremity lymphedema Neuro: nonfocal, well oriented, appropriate affect Breasts: The mass is still palpable in the anterior aspect of the right breast.  The left breast is unremarkable    LAB RESULTS:  CMP     Component Value Date/Time   NA 140 09/02/2018 0845   K 3.1 (L) 09/02/2018 0845   CL 102 09/02/2018 0845   CO2 26 09/02/2018 0845   GLUCOSE 128  (H) 09/02/2018 0845   BUN 11 09/02/2018 0845   CREATININE 0.81 09/02/2018 0845   CREATININE 1.14 (H) 07/24/2018 0825   CREATININE 0.70 04/14/2015 1558   CALCIUM 9.7 09/02/2018 0845   PROT 7.2 09/02/2018 0845   ALBUMIN 3.7 09/02/2018 0845   AST 17 09/02/2018 0845   AST 12 (L) 07/24/2018 0825   ALT 17 09/02/2018 0845   ALT 15 07/24/2018 0825   ALKPHOS 109 09/02/2018 0845   BILITOT <0.2 (L) 09/02/2018 0845   BILITOT 0.5 07/24/2018 0825   GFRNONAA >60 09/02/2018 0845   GFRNONAA 54 (L) 07/24/2018 0825   GFRNONAA 87 03/27/2014 1130   GFRAA >60 09/02/2018 0845   GFRAA >60 07/24/2018 0825   GFRAA >89 03/27/2014 1130    No results found for: TOTALPROTELP, ALBUMINELP, A1GS, A2GS, BETS, BETA2SER, GAMS, MSPIKE, SPEI  No results found for: KPAFRELGTCHN, LAMBDASER, KAPLAMBRATIO  Lab Results  Component Value Date   WBC 20.9 (H) 09/02/2018   NEUTROABS PENDING 09/02/2018   HGB 10.1 (L) 09/02/2018   HCT 30.7 (L) 09/02/2018   MCV 86.5 09/02/2018   PLT 317 09/02/2018    _0 @  No results found for: LABCA2  No components found for: [id number]  No results for input(s): INR in the last 168 hours.  No results found for: LABCA2  No results found for: DPO242  No results found for: PNT614  No results found for: ERX540  No results found for: CA2729  No components found for: HGQUANT  No results found for: CEA1 / No results found for: CEA1   No results found for: AFPTUMOR  No results found for: CHROMOGRNA  No results found for: PSA1  Appointment on 09/02/2018  Component Date Value Ref Range Status  Sodium 09/02/2018 140  135 - 145 mmol/L Final   Potassium 09/02/2018 3.1* 3.5 - 5.1 mmol/L Final   Chloride 09/02/2018 102  98 - 111 mmol/L Final   CO2 09/02/2018 26  22 - 32 mmol/L Final   Glucose, Bld 09/02/2018 128* 70 - 99 mg/dL Final   BUN 09/02/2018 11  6 - 20 mg/dL Final   Creatinine, Ser 09/02/2018 0.81  0.44 - 1.00 mg/dL Final   Calcium 09/02/2018 9.7   8.9 - 10.3 mg/dL Final   Total Protein 09/02/2018 7.2  6.5 - 8.1 g/dL Final   Albumin 09/02/2018 3.7  3.5 - 5.0 g/dL Final   AST 09/02/2018 17  15 - 41 U/L Final   ALT 09/02/2018 17  0 - 44 U/L Final   Alkaline Phosphatase 09/02/2018 109  38 - 126 U/L Final   Total Bilirubin 09/02/2018 <0.2* 0.3 - 1.2 mg/dL Final   GFR calc non Af Amer 09/02/2018 >60  >60 mL/min Final   GFR calc Af Amer 09/02/2018 >60  >60 mL/min Final   Anion gap 09/02/2018 12  5 - 15 Final   Performed at [facility], [place] [address] [place], [place] [address]   WBC 09/02/2018 20.9* 4.0 - 10.5 K/uL Final   RBC 09/02/2018 3.55* 3.87 - 5.11 MIL/uL Final   Hemoglobin 09/02/2018 10.1* 12.0 - 15.0 g/dL Final   HCT 09/02/2018 30.7* 36.0 - 46.0 % Final   MCV 09/02/2018 86.5  80.0 - 100.0 fL Final   MCH 09/02/2018 28.5  26.0 - 34.0 pg Final   MCHC 09/02/2018 32.9  30.0 - 36.0 g/dL Final   RDW 09/02/2018 14.5  11.5 - 15.5 % Final   Platelets 09/02/2018 317  150 - 400 K/uL Final   nRBC 09/02/2018 0.2  0.0 - 0.2 % Final   Performed at [facility], [place] [address], [place], [place] [address]   Neutrophils Relative % 09/02/2018 PENDING  % Incomplete   Neutro Abs 09/02/2018 PENDING  1.7 - 7.7 K/uL Incomplete   Band Neutrophils 09/02/2018 PENDING  % Incomplete   Lymphocytes Relative 09/02/2018 PENDING  % Incomplete   Lymphs Abs 09/02/2018 PENDING  0.7 - 4.0 K/uL Incomplete   Monocytes Relative 09/02/2018 PENDING  % Incomplete   Monocytes Absolute 09/02/2018 PENDING  0.1 - 1.0 K/uL Incomplete   Eosinophils Relative 09/02/2018 PENDING  % Incomplete   Eosinophils Absolute 09/02/2018 PENDING  0.0 - 0.5 K/uL Incomplete   Basophils Relative 09/02/2018 PENDING  % Incomplete   Basophils Absolute 09/02/2018 PENDING  0.0 - 0.1 K/uL Incomplete   WBC Morphology 09/02/2018 PENDING   Incomplete   RBC Morphology 09/02/2018 PENDING   Incomplete   Smear Review  09/02/2018 PENDING   Incomplete   Other 09/02/2018 PENDING  % Incomplete   nRBC 09/02/2018 PENDING  0 /100 WBC Incomplete   Metamyelocytes Relative 09/02/2018 PENDING  % Incomplete   Myelocytes 09/02/2018 PENDING  % Incomplete   Promyelocytes Relative 09/02/2018 PENDING  % Incomplete   Blasts 09/02/2018 PENDING  % Incomplete    (this displays the last labs from the last 3 days)  No results found for: TOTALPROTELP, ALBUMINELP, A1GS, A2GS, BETS, BETA2SER, GAMS, MSPIKE, SPEI (this displays SPEP labs)  No results found for: KPAFRELGTCHN, LAMBDASER, KAPLAMBRATIO (kappa/lambda light chains)  No results found for: HGBA, HGBA2QUANT, HGBFQUANT, HGBSQUAN (Hemoglobinopathy evaluation)   No results found for: LDH  No results found for: IRON, TIBC, IRONPCTSAT (Iron and TIBC)  No results found for:  FERRITIN  Urinalysis    Component Value Date/Time   COLORURINE YELLOW 03/07/2016 1350   APPEARANCEUR CLEAR 03/07/2016 1350   LABSPEC 1.031 (H) 03/07/2016 1350   PHURINE 5.0 03/07/2016 1350   GLUCOSEU >1000 (A) 03/07/2016 1350   HGBUR NEGATIVE 03/07/2016 1350   BILIRUBINUR NEGATIVE 03/07/2016 1350   BILIRUBINUR neg 11/24/2013 1547   KETONESUR 15 (A) 03/07/2016 1350   PROTEINUR NEGATIVE 03/07/2016 1350   UROBILINOGEN 0.2 11/24/2013 1547   UROBILINOGEN 0.2 11/10/2006 1153   NITRITE NEGATIVE 03/07/2016 1350   LEUKOCYTESUR NEGATIVE 03/07/2016 1350     STUDIES:  No results found.   ELIGIBLE FOR AVAILABLE RESEARCH PROTOCOL: No   ASSESSMENT: 58 y.o. [place], [place] woman status post right breast biopsy x2 for multicentric invasive ductal carcinoma, clinically T1c N1, stage IIB, grade 3, functionally triple negative, with an MIB-1 of 70%  (a) right axillary lymph node biopsied at the same time was positive  (1) neoadjuvant chemotherapy will consist of doxorubicin and cyclophosphamide in dose dense fashion x4 followed by weekly Abraxane and carboplatin x12 starting 08/07/2018  (2)  definitive surgery to follow  (3) adjuvant radiation as appropriate  (4) genetics testing 08/06/2018 through the Breast + GYN Cancers Panel offered by [facility] found no deleterious mutations in ATM, BARD1, BRCA1, BRCA2, BRIP1, CDH1, CHEK2, DICER1, EPCAM, MLH1, MSH2, MSH6, NBN, NF1, PALB2, PMS2, PTEN, RAD50, RAD51C, RAD51D, SMARCA4, STK11, TP53.     PLAN: [place] had significant hyperglycemia secondary to the Decadron and we are going to discontinue that.  She had not been taking the Compazine regularly.  She will take it beginning tonight and then 3 times a day for the next 2 days.  After day 3 she may take ondansetron as well and I have written for that today.  She has a "bad day" after the Neulasta.  Unfortunately I cannot make that much better but she is taking Aleve and Tylenol to help her through it as well as Claritin.  Luckily she only has 1 more cycle of this chemotherapy before proceeding to the less intense portion of her treatment  After her next cycle we may consider a repeat breast ultrasound just to make sure we are not seeing progression through treatment  She knows to call for any other issue that may develop before the next visit.      Sufyan Meidinger, Virgie Dad, MD  09/02/18 9:44 AM Medical Oncology and Hematology [facility] [address] [place], [place] [address] Tel. [phone]    Fax. [phone]   I, Wilburn Mylar, am acting as scribe for Dr. Virgie Dad. Toddy Boyd.  I, Lurline Del MD, have reviewed the above documentation for accuracy and completeness, and I agree with the above.

## 2018-08-31 ENCOUNTER — Encounter: Payer: Self-pay | Admitting: Family Medicine

## 2018-09-02 ENCOUNTER — Inpatient Hospital Stay: Payer: Managed Care, Other (non HMO)

## 2018-09-02 ENCOUNTER — Other Ambulatory Visit: Payer: Self-pay

## 2018-09-02 ENCOUNTER — Inpatient Hospital Stay (HOSPITAL_BASED_OUTPATIENT_CLINIC_OR_DEPARTMENT_OTHER): Payer: Managed Care, Other (non HMO) | Admitting: Oncology

## 2018-09-02 VITALS — BP 160/75 | HR 80 | Temp 99.0°F | Resp 18 | Ht 66.0 in | Wt 187.5 lb

## 2018-09-02 DIAGNOSIS — R110 Nausea: Secondary | ICD-10-CM

## 2018-09-02 DIAGNOSIS — C50811 Malignant neoplasm of overlapping sites of right female breast: Secondary | ICD-10-CM | POA: Diagnosis not present

## 2018-09-02 DIAGNOSIS — Z171 Estrogen receptor negative status [ER-]: Secondary | ICD-10-CM

## 2018-09-02 DIAGNOSIS — E876 Hypokalemia: Secondary | ICD-10-CM

## 2018-09-02 DIAGNOSIS — R531 Weakness: Secondary | ICD-10-CM

## 2018-09-02 DIAGNOSIS — Z170 Estrogen receptor positive status [ER+]: Secondary | ICD-10-CM | POA: Diagnosis not present

## 2018-09-02 DIAGNOSIS — Z95828 Presence of other vascular implants and grafts: Secondary | ICD-10-CM

## 2018-09-02 DIAGNOSIS — Z7689 Persons encountering health services in other specified circumstances: Secondary | ICD-10-CM

## 2018-09-02 DIAGNOSIS — R51 Headache: Secondary | ICD-10-CM

## 2018-09-02 DIAGNOSIS — R5383 Other fatigue: Secondary | ICD-10-CM

## 2018-09-02 DIAGNOSIS — Z794 Long term (current) use of insulin: Secondary | ICD-10-CM

## 2018-09-02 DIAGNOSIS — R197 Diarrhea, unspecified: Secondary | ICD-10-CM

## 2018-09-02 DIAGNOSIS — IMO0001 Reserved for inherently not codable concepts without codable children: Secondary | ICD-10-CM

## 2018-09-02 DIAGNOSIS — M129 Arthropathy, unspecified: Secondary | ICD-10-CM

## 2018-09-02 DIAGNOSIS — J45909 Unspecified asthma, uncomplicated: Secondary | ICD-10-CM

## 2018-09-02 DIAGNOSIS — E1165 Type 2 diabetes mellitus with hyperglycemia: Secondary | ICD-10-CM

## 2018-09-02 DIAGNOSIS — R05 Cough: Secondary | ICD-10-CM

## 2018-09-02 DIAGNOSIS — Z79899 Other long term (current) drug therapy: Secondary | ICD-10-CM

## 2018-09-02 DIAGNOSIS — Z801 Family history of malignant neoplasm of trachea, bronchus and lung: Secondary | ICD-10-CM

## 2018-09-02 DIAGNOSIS — Z87891 Personal history of nicotine dependence: Secondary | ICD-10-CM

## 2018-09-02 LAB — COMPREHENSIVE METABOLIC PANEL
ALT: 17 U/L (ref 0–44)
AST: 17 U/L (ref 15–41)
Albumin: 3.7 g/dL (ref 3.5–5.0)
Alkaline Phosphatase: 109 U/L (ref 38–126)
Anion gap: 12 (ref 5–15)
BUN: 11 mg/dL (ref 6–20)
CO2: 26 mmol/L (ref 22–32)
Calcium: 9.7 mg/dL (ref 8.9–10.3)
Chloride: 102 mmol/L (ref 98–111)
Creatinine, Ser: 0.81 mg/dL (ref 0.44–1.00)
GFR calc Af Amer: >60 mL/min (ref >60)
GFR calc non Af Amer: >60 mL/min (ref >60)
Glucose, Bld: 128 mg/dL — ABNORMAL HIGH (ref 70–99)
Potassium: 3.1 mmol/L — ABNORMAL LOW (ref 3.5–5.1)
Sodium: 140 mmol/L (ref 135–145)
Total Bilirubin: <0.2 mg/dL — ABNORMAL LOW (ref 0.3–1.2)
Total Protein: 7.2 g/dL (ref 6.5–8.1)

## 2018-09-02 LAB — CBC WITH DIFFERENTIAL/PLATELET
Abs Immature Granulocytes: 4.42 10*3/uL — ABNORMAL HIGH (ref 0.00–0.07)
Basophils Absolute: 0 10*3/uL (ref 0.0–0.1)
Basophils Relative: 0 %
Eosinophils Absolute: 0 10*3/uL (ref 0.0–0.5)
Eosinophils Relative: 0 %
HCT: 30.7 % — ABNORMAL LOW (ref 36.0–46.0)
Hemoglobin: 10.1 g/dL — ABNORMAL LOW (ref 12.0–15.0)
Immature Granulocytes: 21 %
Lymphocytes Relative: 7 %
Lymphs Abs: 1.4 10*3/uL (ref 0.7–4.0)
MCH: 28.5 pg (ref 26.0–34.0)
MCHC: 32.9 g/dL (ref 30.0–36.0)
MCV: 86.5 fL (ref 80.0–100.0)
Monocytes Absolute: 1.6 10*3/uL — ABNORMAL HIGH (ref 0.1–1.0)
Monocytes Relative: 8 %
Neutro Abs: 13.5 10*3/uL — ABNORMAL HIGH (ref 1.7–7.7)
Neutrophils Relative %: 64 %
Platelets: 317 10*3/uL (ref 150–400)
RBC: 3.55 MIL/uL — ABNORMAL LOW (ref 3.87–5.11)
RDW: 14.5 % (ref 11.5–15.5)
WBC: 20.9 10*3/uL — ABNORMAL HIGH (ref 4.0–10.5)
nRBC: 0.2 % (ref 0.0–0.2)

## 2018-09-02 MED ORDER — SODIUM CHLORIDE 0.9% FLUSH
10.0000 mL | Freq: Once | INTRAVENOUS | Status: AC
  Administered 2018-09-02: 10 mL
  Filled 2018-09-02: qty 10

## 2018-09-02 MED ORDER — PEGFILGRASTIM 6 MG/0.6ML ~~LOC~~ PSKT
6.0000 mg | PREFILLED_SYRINGE | Freq: Once | SUBCUTANEOUS | Status: AC
  Administered 2018-09-02: 12:00:00 6 mg via SUBCUTANEOUS

## 2018-09-02 MED ORDER — SODIUM CHLORIDE 0.9% FLUSH
10.0000 mL | INTRAVENOUS | Status: DC | PRN
  Administered 2018-09-02: 10 mL
  Filled 2018-09-02: qty 10

## 2018-09-02 MED ORDER — PEGFILGRASTIM 6 MG/0.6ML ~~LOC~~ PSKT
PREFILLED_SYRINGE | SUBCUTANEOUS | Status: AC
  Filled 2018-09-02: qty 0.6

## 2018-09-02 MED ORDER — PALONOSETRON HCL INJECTION 0.25 MG/5ML
0.2500 mg | Freq: Once | INTRAVENOUS | Status: AC
  Administered 2018-09-02: 10:00:00 0.25 mg via INTRAVENOUS

## 2018-09-02 MED ORDER — SODIUM CHLORIDE 0.9 % IV SOLN
Freq: Once | INTRAVENOUS | Status: AC
  Administered 2018-09-02: 10:00:00 via INTRAVENOUS
  Filled 2018-09-02: qty 5

## 2018-09-02 MED ORDER — SODIUM CHLORIDE 0.9 % IV SOLN
Freq: Once | INTRAVENOUS | Status: AC
  Administered 2018-09-02: 10:00:00 via INTRAVENOUS
  Filled 2018-09-02: qty 250

## 2018-09-02 MED ORDER — HEPARIN SOD (PORK) LOCK FLUSH 100 UNIT/ML IV SOLN
500.0000 [IU] | Freq: Once | INTRAVENOUS | Status: AC | PRN
  Administered 2018-09-02: 500 [IU]
  Filled 2018-09-02: qty 5

## 2018-09-02 MED ORDER — ONDANSETRON HCL 8 MG PO TABS: 8.0000 mg | ORAL_TABLET | Freq: Three times a day (TID) | ORAL | 0 refills | Status: DC | PRN

## 2018-09-02 MED ORDER — SODIUM CHLORIDE 0.9 % IV SOLN
500.0000 mg/m2 | Freq: Once | INTRAVENOUS | Status: AC
  Administered 2018-09-02: 1020 mg via INTRAVENOUS
  Filled 2018-09-02: qty 51

## 2018-09-02 MED ORDER — DOXORUBICIN HCL CHEMO IV INJECTION 2 MG/ML
50.0000 mg/m2 | Freq: Once | INTRAVENOUS | Status: AC
  Administered 2018-09-02: 11:00:00 102 mg via INTRAVENOUS
  Filled 2018-09-02: qty 51

## 2018-09-02 MED ORDER — PALONOSETRON HCL INJECTION 0.25 MG/5ML
INTRAVENOUS | Status: AC
  Filled 2018-09-02: qty 5

## 2018-09-02 NOTE — Telephone Encounter (Signed)
Called and authorized test strips and lancets for her

## 2018-09-02 NOTE — Patient Instructions (Signed)
[facility] Discharge Instructions for Patients Receiving Chemotherapy  Today you received the following chemotherapy agents:  Adriamycin and Cytoxan.  To help prevent nausea and vomiting after your treatment, we encourage you to take your nausea medication as directed.   If you develop nausea and vomiting that is not controlled by your nausea medication, call the clinic.   BELOW ARE SYMPTOMS THAT SHOULD BE REPORTED IMMEDIATELY:  *FEVER GREATER THAN 100.5 F  *CHILLS WITH OR WITHOUT FEVER  NAUSEA AND VOMITING THAT IS NOT CONTROLLED WITH YOUR NAUSEA MEDICATION  *UNUSUAL SHORTNESS OF BREATH  *UNUSUAL BRUISING OR BLEEDING  TENDERNESS IN MOUTH AND THROAT WITH OR WITHOUT PRESENCE OF ULCERS  *URINARY PROBLEMS  *BOWEL PROBLEMS  UNUSUAL RASH Items with * indicate a potential emergency and should be followed up as soon as possible.  Feel free to call the clinic should you have any questions or concerns. The clinic phone number is [phone].  Please show the [facility] at check-in to the Emergency Department and triage nurse.  Pegfilgrastim injection(Onpro) What is this medicine? PEGFILGRASTIM (PEG fil gra stim) is a long-acting granulocyte colony-stimulating factor that stimulates the growth of neutrophils, a type of white blood cell important in the body's fight against infection. It is used to reduce the incidence of fever and infection in patients with certain types of cancer who are receiving chemotherapy that affects the bone marrow, and to increase survival after being exposed to high doses of radiation. This medicine may be used for other purposes; ask your health care provider or pharmacist if you have questions. COMMON BRAND NAME(S): Domenic Moras, UDENYCA What should I tell my health care provider before I take this medicine? They need to know if you have any of these conditions: -kidney disease -latex allergy -ongoing radiation  therapy -sickle cell disease -skin reactions to acrylic adhesives (On-Body Injector only) -an unusual or allergic reaction to pegfilgrastim, filgrastim, other medicines, foods, dyes, or preservatives -pregnant or trying to get pregnant -breast-feeding How should I use this medicine? This medicine is for injection under the skin. If you get this medicine at home, you will be taught how to prepare and give the pre-filled syringe or how to use the On-body Injector. Refer to the patient Instructions for Use for detailed instructions. Use exactly as directed. Tell your healthcare provider immediately if you suspect that the On-body Injector may not have performed as intended or if you suspect the use of the On-body Injector resulted in a missed or partial dose. It is important that you put your used needles and syringes in a special sharps container. Do not put them in a trash can. If you do not have a sharps container, call your pharmacist or healthcare provider to get one. Talk to your pediatrician regarding the use of this medicine in children. While this drug may be prescribed for selected conditions, precautions do apply. Overdosage: If you think you have taken too much of this medicine contact a poison control center or emergency room at once. NOTE: This medicine is only for you. Do not share this medicine with others. What if I miss a dose? It is important not to miss your dose. Call your doctor or health care professional if you miss your dose. If you miss a dose due to an On-body Injector failure or leakage, a new dose should be administered as soon as possible using a single prefilled syringe for manual use. What may interact with this medicine? Interactions have not  been studied. Give your health care provider a list of all the medicines, herbs, non-prescription drugs, or dietary supplements you use. Also tell them if you smoke, drink alcohol, or use illegal drugs. Some items may interact with  your medicine. This list may not describe all possible interactions. Give your health care provider a list of all the medicines, herbs, non-prescription drugs, or dietary supplements you use. Also tell them if you smoke, drink alcohol, or use illegal drugs. Some items may interact with your medicine. What should I watch for while using this medicine? You may need blood work done while you are taking this medicine. If you are going to need a MRI, CT scan, or other procedure, tell your doctor that you are using this medicine (On-Body Injector only). What side effects may I notice from receiving this medicine? Side effects that you should report to your doctor or health care professional as soon as possible: -allergic reactions like skin rash, itching or hives, swelling of the face, lips, or tongue -back pain -dizziness -fever -pain, redness, or irritation at site where injected -pinpoint red spots on the skin -red or dark-brown urine -shortness of breath or breathing problems -stomach or side pain, or pain at the shoulder -swelling -tiredness -trouble passing urine or change in the amount of urine Side effects that usually do not require medical attention (report to your doctor or health care professional if they continue or are bothersome): -bone pain -muscle pain This list may not describe all possible side effects. Call your doctor for medical advice about side effects. You may report side effects to FDA at 1-800-FDA-1088. Where should I keep my medicine? Keep out of the reach of children. If you are using this medicine at home, you will be instructed on how to store it. Throw away any unused medicine after the expiration date on the label. NOTE: This sheet is a summary. It may not cover all possible information. If you have questions about this medicine, talk to your doctor, pharmacist, or health care provider.  2019 [employer] (2017-07-30 16:57:08)

## 2018-09-02 NOTE — Patient Instructions (Signed)

## 2018-09-03 ENCOUNTER — Other Ambulatory Visit: Payer: Self-pay | Admitting: Oncology

## 2018-09-03 ENCOUNTER — Encounter: Payer: Self-pay | Admitting: Oncology

## 2018-09-03 DIAGNOSIS — Z170 Estrogen receptor positive status [ER+]: Principal | ICD-10-CM

## 2018-09-03 DIAGNOSIS — C50811 Malignant neoplasm of overlapping sites of right female breast: Secondary | ICD-10-CM

## 2018-09-03 DIAGNOSIS — E1165 Type 2 diabetes mellitus with hyperglycemia: Secondary | ICD-10-CM

## 2018-09-03 DIAGNOSIS — IMO0001 Reserved for inherently not codable concepts without codable children: Secondary | ICD-10-CM

## 2018-09-04 ENCOUNTER — Ambulatory Visit: Payer: Managed Care, Other (non HMO)

## 2018-09-10 ENCOUNTER — Other Ambulatory Visit: Payer: Self-pay | Admitting: Oncology

## 2018-09-10 MED ORDER — CYCLOSPORINE 0.05 % OP EMUL: 1.0000 [drp] | Freq: Two times a day (BID) | OPHTHALMIC | 6 refills | Status: DC

## 2018-09-10 NOTE — Progress Notes (Signed)
restasis

## 2018-09-11 MED ORDER — ACETAMINOPHEN 325 MG PO TABS
ORAL_TABLET | ORAL | Status: AC
  Filled 2018-09-11: qty 2

## 2018-09-11 MED ORDER — DIPHENHYDRAMINE HCL 25 MG PO CAPS
ORAL_CAPSULE | ORAL | Status: AC
  Filled 2018-09-11: qty 2

## 2018-09-12 NOTE — Progress Notes (Signed)
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  She continues on neoadjuvant chemotherapy consisting of doxorubicin and cyclophosphamide in dose dense fashion x4 followed by weekly Abraxane and carboplatin x12. Today is day 1 cycle 4 of AC. She reports soaking night sweats, a lot of phlegm production, and a sore throat. She notes the sore throat is focused on one side and can involve painful swallowing.  She is receiving OnPro for white cell support.  She manages the aches and pains related to this well.  Since her last visit here, she has not undergone any additional studies.     REVIEW OF SYSTEMS: Autumn Cook reports doing well Cook. She reports sometimes feeling like her heart is racing, which may be related to her low potassium. She also reports some low-grade fevers in the evening, which is relieved by tylenol. She states she is walking for exercise, but not as much as she used to. The patient denies unusual headaches, visual changes, nausea, vomiting, stiff neck, dizziness, or gait imbalance. There has been no cough, or pleurisy, no chest pain or pressure, and no change in bowel or bladder habits. The patient denies rash, bleeding, unexplained  fatigue or unexplained weight loss. A detailed review of systems was otherwise entirely negative.   HISTORY OF CURRENT ILLNESS: From the original intake note:  Autumn Cook had routine screening mammography on 07/01/2018 showing a possible abnormality in the right breast; she notes that did feel some pain and a lump in her breast leading up to the routine screening. She underwent right breast ultrasonography at [facility] on 07/05/2018 showing: A 1.4 x 1.2 x 1.4 cm oval hypoechoic mass at the 5:30 position 8 cm from the nipple. A 0.9 x 1 x 1 cm slightly irregular hypoechoic mass at the 10:30 position 9 cm from the nipple. A 3 x 1.9 x 3 cm probable lymph node without fatty hilum in the LOWER RIGHT axilla. A RIGHT axillary lymph node with slightly thickened cortex.  Accordingly on 07/10/2018 she proceeded to biopsy of the right breast area in question. The pathology from this procedure showed ([id number]): invasive ductal carcinoma, high grade, 5:30 o'clock. Prognostic indicators significant for: estrogen receptor, 30% positive with weak staining intensity and progesterone receptor, 0% negative. Proliferation marker Ki67 at 70%.   An additional biopsy was performed on the same day ([id number]) showing:  2. Breast, right, needle core biopsy, 10 o'clock - Invasive carcinoma  Finally, an additional biopsy was performed at the right axilla on the same day ([id number]) showing: invasive carcinoma, right axilla. Prognostic indicators significant for: estrogen receptor, 70% positive, with weak staining intensity and progesterone receptor, 0% negative. Proliferation marker Ki67 at 70%. HER2 positive by immunohistochemistry.    The patient's subsequent history is as detailed below.    PAST MEDICAL HISTORY: Past Medical History:  Diagnosis Date  . Allergy   .  Arthritis   . Asthma   . Cancer [facility])    R breast cancer  . Diabetes mellitus without complication ([place])    uncontrolled  . Family  history of adverse reaction to anesthesia    "entire family takes a long time to wake up- N&V for several days"  . Family history of lung cancer   . Hypertension   . Migraine   . PONV (postoperative nausea and vomiting)      PAST SURGICAL HISTORY: Past Surgical History:  Procedure Laterality Date  . BREAST CYST EXCISION  1994  . DILATION AND CURETTAGE OF UTERUS    . PORTACATH PLACEMENT Right 08/01/2018   Procedure: INSERTION PORT-A-CATH WITH ULTRASOUND;  Surgeon: Autumn Overall, MD;  Location: [facility];  Service: General;  Laterality: Right;     FAMILY HISTORY: Family History  Problem Relation Age of Onset  . Hypertension Father   . Cancer Father 24       lung cancer  . Stroke Father   . Heart disease Mother        arrhythmia  . Hypertension Mother   . Kidney failure Sister    Mearl's father died from lung cancer at age 28; he was a heavy smoker. Patients' mother is 18 as of 07/2018. The patient has 3 brothers and 2 sisters. Patient denies anyone in her family having breast, ovarian, prostate, or pancreatic cancer. Adriyanna's maternal great great uncle was diagnosed with lung cancer.     GYNECOLOGIC HISTORY:  Patient's last menstrual period was 11/14/2013. 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 Her husband, Autumn Cook, is a Administrator. At home with them is two toy poodles. Autumn Cook has 4 children, Autumn Cook, Autumn Cook, and Autumn Cook. Autumn Cook is 60, lives in [place], and works in Science writer. Autumn Cook is 22, lives in [place], and works in Engineer, technical sales. Autumn Cook is 65, is taking care of Autumn Cook, and works as a Control and instrumentation engineer. Autumn Cook is 13, lives in [place], and is a Buyer, retail.   ADVANCED DIRECTIVES: In the absence of any  documentation, Autumn Cook's spouse, Autumn Cook, is her healthcare power of attorney.     HEALTH MAINTENANCE: Social History   Tobacco Use  . Smoking status: Former Smoker    Packs/day: 1.00    Years: 5.00    Pack years: 5.00    Last attempt to quit: 05/08/1988    Years since quitting: 30.3  . Smokeless tobacco: Never Used  Substance Use Topics  . Alcohol use: No  . Drug use: No    Colonoscopy: yes, 2010 - due 2020.  PAP: 2017  Bone density: yes, 2015, normal Mammography: 07/01/2018   Allergies  Allergen Reactions  . Penicillins Hives    Did it involve swelling of the face/tongue/throat, SOB, or low BP? Yes--syncope episode Did it involve sudden or severe rash/hives, skin peeling, or any reaction on the inside of your mouth or nose? No Did you need to seek medical attention at a hospital or doctor's office? Yes When did it last happen? 37-38 YEARS AGO If all above answers are "NO", may proceed with cephalosporin use.   . Metformin And Related Diarrhea  . Adhesive [Tape] Other (See Comments)    "severe bruising"  ALSO SKIN GLUE  .  Asa [Aspirin] Other (See Comments)    GI upset     Current Outpatient Medications  Medication Sig Dispense Refill  . albuterol (PROVENTIL HFA;VENTOLIN HFA) 108 (90 Base) MCG/ACT inhaler Inhale 2 puffs into the lungs every 4 (four) hours as needed for wheezing or shortness of breath (cough, shortness of breath or wheezing.). 1 Inhaler 1  . atenolol (TENORMIN) 100 MG tablet TAKE 1 TABLET BY MOUTH EVERY DAY (Patient taking differently: Take 100 mg by mouth every evening. ) 30 tablet 5  . atorvastatin (LIPITOR) 20 MG tablet Take 1 tablet (20 mg total) by mouth daily. 90 tablet 3  . blood glucose meter kit and supplies KIT Dispense based on patient and insurance preference. Use up to four times daily as directed. (FOR ICD-9 250.00, 250.01). 1 each 0  . ciprofloxacin (CIPRO) 500 MG tablet Take 1 tablet (500 mg total) by mouth 2 (two) times daily. 60  tablet 0  . cycloSPORINE (RESTASIS) 0.05 % ophthalmic emulsion Place 1 drop into both eyes 2 (two) times daily. 1.5 mL 6  . dapagliflozin propanediol (FARXIGA) 5 MG TABS tablet Take 5 mg by mouth daily. 30 tablet 6  . fluconazole (DIFLUCAN) 100 MG tablet Take 1 tablet (100 mg total) by mouth daily. 15 tablet 1  . gabapentin (NEURONTIN) 300 MG capsule Take 1 capsule (300 mg total) by mouth at bedtime. 90 capsule 4  . glipiZIDE (GLUCOTROL) 5 MG tablet Start with 5 mg once a day, then increase to twice a day after 1 week.  Continue to increase as directed by MD 120 tablet 3  . glucose blood (ACCU-CHEK ACTIVE STRIPS) test strip Use as instructed- check blood sugar up to 2x a day 100 each 12  . insulin aspart (NOVOLOG FLEXPEN) 100 UNIT/ML FlexPen If glucose 250-300 give 4 units. If glucose 301- 350 give 6 units, if glucose 351- 400 give 8 units.  If higher than 400 give 10 units.   May repeat in one hour if glucose still over 400. 15 mL 3  . Insulin Glargine (BASAGLAR KWIKPEN) 100 UNIT/ML SOPN INJECT 14 UNITS SUBCUTANEOUSLY DAILY (Patient taking differently: Inject 16 Units into the skin daily. ) 15 mL 3  . Insulin Pen Needle (PEN NEEDLES) 32G X 4 MM MISC 1 each by Does not apply route daily. 100 each 3  . Insulin Pen Needle (PEN NEEDLES) 32G X 4 MM MISC 1 each by Does not apply route daily. 100 each 3  . lidocaine-prilocaine (EMLA) cream Apply to affected area once 30 g 3  . lisinopril-hydrochlorothiazide (ZESTORETIC) 20-12.5 MG tablet Take 2 tablets by mouth daily. (Patient taking differently: Take 2 tablets by mouth every evening. ) 180 tablet 3  . loratadine (CLARITIN) 10 MG tablet Take 1 tablet (10 mg total) by mouth daily.    Marland Kitchen LORazepam (ATIVAN) 0.5 MG tablet TAKE 1 TABLET (0.5 MG TOTAL) BY MOUTH AT BEDTIME AS NEEDED (NAUSEA OR VOMITING). 30 tablet 0  . magic mouthwash SOLN Take 5 mLs by mouth 4 (four) times daily as needed for mouth pain. 180 mL 3  . omeprazole (PRILOSEC) 40 MG capsule Take 1  capsule (40 mg total) by mouth daily. 60 capsule 1  . ondansetron (ZOFRAN) 8 MG tablet Take 1 tablet (8 mg total) by mouth every 8 (eight) hours as needed for nausea or vomiting. 30 tablet 2  . ondansetron (ZOFRAN) 8 MG tablet Take 1 tablet (8 mg total) by mouth every 8 (eight) hours as needed for nausea or vomiting.  20 tablet 0  . ONE TOUCH ULTRA TEST test strip Check sugar 2 times daily 100 each prn  . ONETOUCH DELICA LANCETS 23N MISC Check sugar 2 times daily 100 each prn  . potassium chloride SA (K-DUR) 20 MEQ tablet Take 1 tablet (20 mEq total) by mouth daily. 90 tablet 4  . prochlorperazine (COMPAZINE) 10 MG tablet Take 1 tablet (10 mg total) by mouth every 6 (six) hours as needed (Nausea or vomiting). 30 tablet 1  . valACYclovir (VALTREX) 500 MG tablet Take 1 tablet (500 mg total) by mouth daily. 10 tablet 1  . Vitamin D, Ergocalciferol, (DRISDOL) 1.25 MG (50000 UT) CAPS capsule TAKE 1 CAPSULE BY MOUTH EVERY 7 DAYS (Patient taking differently: Take 50,000 Units by mouth every Saturday. ) 12 capsule 1   No current facility-administered medications for this visit.      OBJECTIVE: Middle-aged African-American woman in no acute distress  Vitals:   09/16/18 0854  BP: (!) 150/73  Pulse: 74  Resp: 18  Temp: 98.3 F (36.8 C)  SpO2: 100%     Body mass index is 30.38 kg/m.   Wt Readings from Last 3 Encounters:  09/16/18 188 lb 3.2 oz (85.4 kg)  09/02/18 187 lb 8 oz (85 kg)  08/20/18 191 lb 12 oz (87 kg)      ECOG FS:1 - Symptomatic but completely ambulatory  Sclerae unicteric, not inflamed, pupils round and equal Oropharynx clear and moist, no lesions noted  no cervical or supraclavicular adenopathy Lungs no rales or rhonchi Heart regular rate and rhythm Abd soft, nontender, positive bowel sounds MSK no focal spinal tenderness, port intact Neuro: nonfocal, well oriented, appropriate affect Breasts: I do not palpate a well-defined mass in the right breast.  The left breast is  benign.  Both axillae are benign   LAB RESULTS:  CMP     Component Value Date/Time   NA 142 09/16/2018 0832   K 2.7 (LL) 09/16/2018 0832   CL 102 09/16/2018 0832   CO2 27 09/16/2018 0832   GLUCOSE 121 (H) 09/16/2018 0832   BUN 9 09/16/2018 0832   CREATININE 0.79 09/16/2018 0832   CREATININE 1.14 (H) 07/24/2018 0825   CREATININE 0.70 04/14/2015 1558   CALCIUM 9.5 09/16/2018 0832   PROT 7.1 09/16/2018 0832   ALBUMIN 3.5 09/16/2018 0832   AST 16 09/16/2018 0832   AST 12 (L) 07/24/2018 0825   ALT 20 09/16/2018 0832   ALT 15 07/24/2018 0825   ALKPHOS 114 09/16/2018 0832   BILITOT <0.2 (L) 09/16/2018 0832   BILITOT 0.5 07/24/2018 0825   GFRNONAA >60 09/16/2018 0832   GFRNONAA 54 (L) 07/24/2018 0825   GFRNONAA 87 03/27/2014 1130   GFRAA >60 09/16/2018 0832   GFRAA >60 07/24/2018 0825   GFRAA >89 03/27/2014 1130    No results found for: TOTALPROTELP, ALBUMINELP, A1GS, A2GS, BETS, BETA2SER, GAMS, MSPIKE, SPEI  No results found for: KPAFRELGTCHN, LAMBDASER, KAPLAMBRATIO  Lab Results  Component Value Date   WBC 24.6 (H) 09/16/2018   NEUTROABS 17.5 (H) 09/16/2018   HGB 9.2 (L) 09/16/2018   HCT 27.6 (L) 09/16/2018   MCV 89.0 09/16/2018   PLT 275 09/16/2018    @LASTCHEMISTRY @  No results found for: LABCA2  No components found for: [id number]  No results for input(s): INR in the last 168 hours.  No results found for: LABCA2  No results found for: MGQ676  No results found for: PPJ093  No results found for: OIZ124  No results found  for: CA2729  No components found for: HGQUANT  No results found for: CEA1 / No results found for: CEA1   No results found for: AFPTUMOR  No results found for: CHROMOGRNA  No results found for: PSA1  Appointment on 09/16/2018  Component Date Value Ref Range Status  . Sodium 09/16/2018 142  135 - 145 mmol/L Final  . Potassium 09/16/2018 2.7* 3.5 - 5.1 mmol/L Final   CRITICAL RESULT CALLED TO, READ BACK BY AND VERIFIED WITH:  VALARIE DODD   . Chloride 09/16/2018 102  98 - 111 mmol/L Final  . CO2 09/16/2018 27  22 - 32 mmol/L Final  . Glucose, Bld 09/16/2018 121* 70 - 99 mg/dL Final  . BUN 09/16/2018 9  6 - 20 mg/dL Final  . Creatinine, Ser 09/16/2018 0.79  0.44 - 1.00 mg/dL Final  . Calcium 09/16/2018 9.5  8.9 - 10.3 mg/dL Final  . Total Protein 09/16/2018 7.1  6.5 - 8.1 g/dL Final  . Albumin 09/16/2018 3.5  3.5 - 5.0 g/dL Final  . AST 09/16/2018 16  15 - 41 U/L Final  . ALT 09/16/2018 20  0 - 44 U/L Final  . Alkaline Phosphatase 09/16/2018 114  38 - 126 U/L Final  . Total Bilirubin 09/16/2018 <0.2* 0.3 - 1.2 mg/dL Final  . GFR calc non Af Amer 09/16/2018 >60  >60 mL/min Final  . GFR calc Af Amer 09/16/2018 >60  >60 mL/min Final  . Anion gap 09/16/2018 13  5 - 15 Final   Performed at [facility], [place] [address], [place], [place] [address]  . WBC 09/16/2018 24.6* 4.0 - 10.5 K/uL Final  . RBC 09/16/2018 3.10* 3.87 - 5.11 MIL/uL Final  . Hemoglobin 09/16/2018 9.2* 12.0 - 15.0 g/dL Final  . HCT 09/16/2018 27.6* 36.0 - 46.0 % Final  . MCV 09/16/2018 89.0  80.0 - 100.0 fL Final  . MCH 09/16/2018 29.7  26.0 - 34.0 pg Final  . MCHC 09/16/2018 33.3  30.0 - 36.0 g/dL Final  . RDW 09/16/2018 16.0* 11.5 - 15.5 % Final  . Platelets 09/16/2018 275  150 - 400 K/uL Final  . nRBC 09/16/2018 0.4* 0.0 - 0.2 % Final  . Neutrophils Relative % 09/16/2018 71  % Final  . Neutro Abs 09/16/2018 17.5* 1.7 - 7.7 K/uL Final  . Lymphocytes Relative 09/16/2018 4  % Final  . Lymphs Abs 09/16/2018 1.1  0.7 - 4.0 K/uL Final  . Monocytes Relative 09/16/2018 7  % Final  . Monocytes Absolute 09/16/2018 1.8* 0.1 - 1.0 K/uL Final  . Eosinophils Relative 09/16/2018 0  % Final  . Eosinophils Absolute 09/16/2018 0.0  0.0 - 0.5 K/uL Final  . Basophils Relative 09/16/2018 1  % Final  . Basophils Absolute 09/16/2018 0.2* 0.0 - 0.1 K/uL Final  . Immature Granulocytes 09/16/2018 17  % Final   Increased IG's, likely  caused by Bone Marrow Colony Stimulating Factor received within 30 days.  . Abs Immature Granulocytes 09/16/2018 4.09* 0.00 - 0.07 K/uL Final  . Polychromasia 09/16/2018 PRESENT   Final   Performed at [facility], [place] [address], [place], [place] [address]    (this displays the last labs from the last 3 days)  No results found for: TOTALPROTELP, ALBUMINELP, A1GS, A2GS, BETS, BETA2SER, GAMS, MSPIKE, SPEI (this displays SPEP labs)  No results found for: KPAFRELGTCHN, LAMBDASER, KAPLAMBRATIO (kappa/lambda light chains)  No results found for: HGBA, HGBA2QUANT, HGBFQUANT, HGBSQUAN (Hemoglobinopathy evaluation)   No results  found for: LDH  No results found for: IRON, TIBC, IRONPCTSAT (Iron and TIBC)  No results found for: FERRITIN  Urinalysis    Component Value Date/Time   COLORURINE YELLOW 03/07/2016 1350   APPEARANCEUR CLEAR 03/07/2016 1350   LABSPEC 1.031 (H) 03/07/2016 1350   PHURINE 5.0 03/07/2016 1350   GLUCOSEU >1000 (A) 03/07/2016 1350   HGBUR NEGATIVE 03/07/2016 1350   BILIRUBINUR NEGATIVE 03/07/2016 1350   BILIRUBINUR neg 11/24/2013 1547   KETONESUR 15 (A) 03/07/2016 1350   PROTEINUR NEGATIVE 03/07/2016 1350   UROBILINOGEN 0.2 11/24/2013 1547   UROBILINOGEN 0.2 11/10/2006 1153   NITRITE NEGATIVE 03/07/2016 1350   LEUKOCYTESUR NEGATIVE 03/07/2016 1350     STUDIES:  No results found.   ELIGIBLE FOR AVAILABLE RESEARCH PROTOCOL: No   ASSESSMENT: 58 y.o. [place], [place] woman status post right breast biopsy x2 for multicentric invasive ductal carcinoma, clinically T1c N1, stage IIB, grade 3, functionally triple negative, with an MIB-1 of 70%  (a) right axillary lymph node biopsied at the same time was positive  (1) neoadjuvant chemotherapy will consist of doxorubicin and cyclophosphamide in dose dense fashion x4 followed by weekly Abraxane and carboplatin x12 starting 08/07/2018  (2) definitive surgery to follow  (3) adjuvant radiation  as appropriate  (4) genetics testing 08/06/2018 through the Breast + GYN Cancers Panel offered by [facility] found no deleterious mutations in ATM, BARD1, BRCA1, BRCA2, BRIP1, CDH1, CHEK2, DICER1, EPCAM, MLH1, MSH2, MSH6, NBN, NF1, PALB2, PMS2, PTEN, RAD50, RAD51C, RAD51D, SMARCA4, STK11, TP53.     PLAN: Sable completes the intense portion of her chemotherapy today.  She has received 4 cycles of AC without any dose reductions or delay.  This is very favorable.  I am not palpating a mass in her right breast.  I am setting her up for ultrasound next week so we have an actual measurement therefore we are right now before starting the second portion of her chemo.  She understands she is hypokalemic and we discussed the possible complications of that including cramps, weakness, and heart problems.  She needs to take potassium daily because she is on a diuretic.  I refilled the potassium for her today.  I think she is having some mucositis secondary to the doxorubicin.  I wrote her for Valtrex to take for the next 10 days and hopefully that will prevent that with his final cycle of doxorubicin  She is having night sweats and I wrote for gabapentin to be taken at that time.   She will return in 2 weeks.  She will receive her first carboplatin and Abraxane treatment that day.  She will see my 12 assistant who will discuss with her how to take her supportive medicine for this new type of chemo. Ludia will then see me a week later with cycle 2 to troubleshoot side effects  She knows to call for any other issues that may develop before the next visit. [place] Early, Autumn Dad, MD  09/16/18 9:29 AM Medical Oncology and Hematology [facility] [address] [place], [place] [address] Tel. [phone]    Fax. [phone]   I, Wilburn Mylar, am acting as scribe for Dr. Virgie Cook. Evangela Heffler.  I, Lurline Del MD, have reviewed the above documentation for accuracy and  completeness, and I agree with the above.

## 2018-09-16 ENCOUNTER — Inpatient Hospital Stay: Payer: Managed Care, Other (non HMO)

## 2018-09-16 ENCOUNTER — Inpatient Hospital Stay (HOSPITAL_BASED_OUTPATIENT_CLINIC_OR_DEPARTMENT_OTHER): Payer: Managed Care, Other (non HMO) | Admitting: Oncology

## 2018-09-16 ENCOUNTER — Other Ambulatory Visit: Payer: Self-pay

## 2018-09-16 ENCOUNTER — Inpatient Hospital Stay: Payer: Managed Care, Other (non HMO) | Attending: Oncology

## 2018-09-16 VITALS — BP 150/73 | HR 74 | Temp 98.3°F | Resp 18 | Ht 66.0 in | Wt 188.2 lb

## 2018-09-16 DIAGNOSIS — R61 Generalized hyperhidrosis: Secondary | ICD-10-CM | POA: Diagnosis not present

## 2018-09-16 DIAGNOSIS — M129 Arthropathy, unspecified: Secondary | ICD-10-CM | POA: Insufficient documentation

## 2018-09-16 DIAGNOSIS — C50811 Malignant neoplasm of overlapping sites of right female breast: Secondary | ICD-10-CM

## 2018-09-16 DIAGNOSIS — E876 Hypokalemia: Secondary | ICD-10-CM

## 2018-09-16 DIAGNOSIS — Z7689 Persons encountering health services in other specified circumstances: Secondary | ICD-10-CM

## 2018-09-16 DIAGNOSIS — Z171 Estrogen receptor negative status [ER-]: Secondary | ICD-10-CM | POA: Diagnosis not present

## 2018-09-16 DIAGNOSIS — Z87891 Personal history of nicotine dependence: Secondary | ICD-10-CM | POA: Diagnosis not present

## 2018-09-16 DIAGNOSIS — IMO0001 Reserved for inherently not codable concepts without codable children: Secondary | ICD-10-CM

## 2018-09-16 DIAGNOSIS — I10 Essential (primary) hypertension: Secondary | ICD-10-CM

## 2018-09-16 DIAGNOSIS — Z170 Estrogen receptor positive status [ER+]: Secondary | ICD-10-CM

## 2018-09-16 DIAGNOSIS — Z95828 Presence of other vascular implants and grafts: Secondary | ICD-10-CM

## 2018-09-16 DIAGNOSIS — C773 Secondary and unspecified malignant neoplasm of axilla and upper limb lymph nodes: Secondary | ICD-10-CM

## 2018-09-16 DIAGNOSIS — Z794 Long term (current) use of insulin: Secondary | ICD-10-CM | POA: Diagnosis not present

## 2018-09-16 DIAGNOSIS — Z79899 Other long term (current) drug therapy: Secondary | ICD-10-CM | POA: Diagnosis not present

## 2018-09-16 DIAGNOSIS — E119 Type 2 diabetes mellitus without complications: Secondary | ICD-10-CM | POA: Insufficient documentation

## 2018-09-16 DIAGNOSIS — J029 Acute pharyngitis, unspecified: Secondary | ICD-10-CM | POA: Insufficient documentation

## 2018-09-16 DIAGNOSIS — Z5111 Encounter for antineoplastic chemotherapy: Secondary | ICD-10-CM | POA: Insufficient documentation

## 2018-09-16 DIAGNOSIS — Z801 Family history of malignant neoplasm of trachea, bronchus and lung: Secondary | ICD-10-CM

## 2018-09-16 LAB — CBC WITH DIFFERENTIAL/PLATELET
Abs Immature Granulocytes: 4.09 10*3/uL — ABNORMAL HIGH (ref 0.00–0.07)
Basophils Absolute: 0.2 10*3/uL — ABNORMAL HIGH (ref 0.0–0.1)
Basophils Relative: 1 %
Eosinophils Absolute: 0 10*3/uL (ref 0.0–0.5)
Eosinophils Relative: 0 %
HCT: 27.6 % — ABNORMAL LOW (ref 36.0–46.0)
Hemoglobin: 9.2 g/dL — ABNORMAL LOW (ref 12.0–15.0)
Immature Granulocytes: 17 %
Lymphocytes Relative: 4 %
Lymphs Abs: 1.1 10*3/uL (ref 0.7–4.0)
MCH: 29.7 pg (ref 26.0–34.0)
MCHC: 33.3 g/dL (ref 30.0–36.0)
MCV: 89 fL (ref 80.0–100.0)
Monocytes Absolute: 1.8 10*3/uL — ABNORMAL HIGH (ref 0.1–1.0)
Monocytes Relative: 7 %
Neutro Abs: 17.5 10*3/uL — ABNORMAL HIGH (ref 1.7–7.7)
Neutrophils Relative %: 71 %
Platelets: 275 10*3/uL (ref 150–400)
RBC: 3.1 MIL/uL — ABNORMAL LOW (ref 3.87–5.11)
RDW: 16 % — ABNORMAL HIGH (ref 11.5–15.5)
WBC: 24.6 10*3/uL — ABNORMAL HIGH (ref 4.0–10.5)
nRBC: 0.4 % — ABNORMAL HIGH (ref 0.0–0.2)

## 2018-09-16 LAB — COMPREHENSIVE METABOLIC PANEL
ALT: 20 U/L (ref 0–44)
AST: 16 U/L (ref 15–41)
Albumin: 3.5 g/dL (ref 3.5–5.0)
Alkaline Phosphatase: 114 U/L (ref 38–126)
Anion gap: 13 (ref 5–15)
BUN: 9 mg/dL (ref 6–20)
CO2: 27 mmol/L (ref 22–32)
Calcium: 9.5 mg/dL (ref 8.9–10.3)
Chloride: 102 mmol/L (ref 98–111)
Creatinine, Ser: 0.79 mg/dL (ref 0.44–1.00)
GFR calc Af Amer: >60 mL/min (ref >60)
GFR calc non Af Amer: >60 mL/min (ref >60)
Glucose, Bld: 121 mg/dL — ABNORMAL HIGH (ref 70–99)
Potassium: 2.7 mmol/L — CL (ref 3.5–5.1)
Sodium: 142 mmol/L (ref 135–145)
Total Bilirubin: <0.2 mg/dL — ABNORMAL LOW (ref 0.3–1.2)
Total Protein: 7.1 g/dL (ref 6.5–8.1)

## 2018-09-16 MED ORDER — GABAPENTIN 300 MG PO CAPS: 300.0000 mg | ORAL_CAPSULE | Freq: Every day | ORAL | 4 refills | Status: DC

## 2018-09-16 MED ORDER — POTASSIUM CHLORIDE 10 MEQ/100ML IV SOLN
10.0000 meq | Freq: Once | INTRAVENOUS | Status: AC
  Administered 2018-09-16: 11:00:00 10 meq via INTRAVENOUS
  Filled 2018-09-16: qty 100

## 2018-09-16 MED ORDER — SODIUM CHLORIDE 0.9% FLUSH
10.0000 mL | Freq: Once | INTRAVENOUS | Status: AC
  Administered 2018-09-16: 10 mL
  Filled 2018-09-16: qty 10

## 2018-09-16 MED ORDER — SODIUM CHLORIDE 0.9 % IV SOLN
Freq: Once | INTRAVENOUS | Status: AC
  Administered 2018-09-16: 10:00:00 via INTRAVENOUS
  Filled 2018-09-16: qty 250

## 2018-09-16 MED ORDER — POTASSIUM CHLORIDE CRYS ER 20 MEQ PO TBCR: 20.0000 meq | EXTENDED_RELEASE_TABLET | Freq: Every day | ORAL | 4 refills | Status: DC

## 2018-09-16 MED ORDER — SODIUM CHLORIDE 0.9 % IV SOLN
500.0000 mg/m2 | Freq: Once | INTRAVENOUS | Status: AC
  Administered 2018-09-16: 11:00:00 1020 mg via INTRAVENOUS
  Filled 2018-09-16: qty 51

## 2018-09-16 MED ORDER — VALACYCLOVIR HCL 500 MG PO TABS: 500.0000 mg | ORAL_TABLET | Freq: Every day | ORAL | 1 refills | Status: DC

## 2018-09-16 MED ORDER — SODIUM CHLORIDE 0.9 % IV SOLN
Freq: Once | INTRAVENOUS | Status: AC
  Administered 2018-09-16: 11:00:00 via INTRAVENOUS
  Filled 2018-09-16: qty 250

## 2018-09-16 MED ORDER — PEGFILGRASTIM 6 MG/0.6ML ~~LOC~~ PSKT
PREFILLED_SYRINGE | SUBCUTANEOUS | Status: AC
  Filled 2018-09-16: qty 0.6

## 2018-09-16 MED ORDER — DOXORUBICIN HCL CHEMO IV INJECTION 2 MG/ML
50.0000 mg/m2 | Freq: Once | INTRAVENOUS | Status: AC
  Administered 2018-09-16: 11:00:00 102 mg via INTRAVENOUS
  Filled 2018-09-16: qty 51

## 2018-09-16 MED ORDER — PALONOSETRON HCL INJECTION 0.25 MG/5ML
INTRAVENOUS | Status: AC
  Filled 2018-09-16: qty 5

## 2018-09-16 MED ORDER — PALONOSETRON HCL INJECTION 0.25 MG/5ML
0.2500 mg | Freq: Once | INTRAVENOUS | Status: AC
  Administered 2018-09-16: 10:00:00 0.25 mg via INTRAVENOUS

## 2018-09-16 MED ORDER — PEGFILGRASTIM 6 MG/0.6ML ~~LOC~~ PSKT
6.0000 mg | PREFILLED_SYRINGE | Freq: Once | SUBCUTANEOUS | Status: AC
  Administered 2018-09-16: 12:00:00 6 mg via SUBCUTANEOUS

## 2018-09-16 MED ORDER — HEPARIN SOD (PORK) LOCK FLUSH 100 UNIT/ML IV SOLN
500.0000 [IU] | Freq: Once | INTRAVENOUS | Status: AC | PRN
  Administered 2018-09-16: 12:00:00 500 [IU]
  Filled 2018-09-16: qty 5

## 2018-09-16 MED ORDER — SODIUM CHLORIDE 0.9 % IV SOLN
Freq: Once | INTRAVENOUS | Status: AC
  Administered 2018-09-16: 10:00:00 via INTRAVENOUS
  Filled 2018-09-16: qty 5

## 2018-09-16 MED ORDER — SODIUM CHLORIDE 0.9% FLUSH
10.0000 mL | INTRAVENOUS | Status: DC | PRN
  Administered 2018-09-16: 12:00:00 10 mL
  Filled 2018-09-16: qty 10

## 2018-09-16 NOTE — Patient Instructions (Signed)
[facility] Discharge Instructions for Patients Receiving Chemotherapy  Today you received the following chemotherapy agents:  Adriamycin and Cytoxan.  To help prevent nausea and vomiting after your treatment, we encourage you to take your nausea medication as directed.   If you develop nausea and vomiting that is not controlled by your nausea medication, call the clinic.   BELOW ARE SYMPTOMS THAT SHOULD BE REPORTED IMMEDIATELY:  *FEVER GREATER THAN 100.5 F  *CHILLS WITH OR WITHOUT FEVER  NAUSEA AND VOMITING THAT IS NOT CONTROLLED WITH YOUR NAUSEA MEDICATION  *UNUSUAL SHORTNESS OF BREATH  *UNUSUAL BRUISING OR BLEEDING  TENDERNESS IN MOUTH AND THROAT WITH OR WITHOUT PRESENCE OF ULCERS  *URINARY PROBLEMS  *BOWEL PROBLEMS  UNUSUAL RASH Items with * indicate a potential emergency and should be followed up as soon as possible.  Feel free to call the clinic should you have any questions or concerns. The clinic phone number is [phone].  Please show the [facility] at check-in to the Emergency Department and triage nurse.  Pegfilgrastim injection(Onpro) What is this medicine? PEGFILGRASTIM (PEG fil gra stim) is a long-acting granulocyte colony-stimulating factor that stimulates the growth of neutrophils, a type of white blood cell important in the body's fight against infection. It is used to reduce the incidence of fever and infection in patients with certain types of cancer who are receiving chemotherapy that affects the bone marrow, and to increase survival after being exposed to high doses of radiation. This medicine may be used for other purposes; ask your health care provider or pharmacist if you have questions. COMMON BRAND NAME(S): Domenic Moras, UDENYCA What should I tell my health care provider before I take this medicine? They need to know if you have any of these conditions: -kidney disease -latex allergy -ongoing radiation  therapy -sickle cell disease -skin reactions to acrylic adhesives (On-Body Injector only) -an unusual or allergic reaction to pegfilgrastim, filgrastim, other medicines, foods, dyes, or preservatives -pregnant or trying to get pregnant -breast-feeding How should I use this medicine? This medicine is for injection under the skin. If you get this medicine at home, you will be taught how to prepare and give the pre-filled syringe or how to use the On-body Injector. Refer to the patient Instructions for Use for detailed instructions. Use exactly as directed. Tell your healthcare provider immediately if you suspect that the On-body Injector may not have performed as intended or if you suspect the use of the On-body Injector resulted in a missed or partial dose. It is important that you put your used needles and syringes in a special sharps container. Do not put them in a trash can. If you do not have a sharps container, call your pharmacist or healthcare provider to get one. Talk to your pediatrician regarding the use of this medicine in children. While this drug may be prescribed for selected conditions, precautions do apply. Overdosage: If you think you have taken too much of this medicine contact a poison control center or emergency room at once. NOTE: This medicine is only for you. Do not share this medicine with others. What if I miss a dose? It is important not to miss your dose. Call your doctor or health care professional if you miss your dose. If you miss a dose due to an On-body Injector failure or leakage, a new dose should be administered as soon as possible using a single prefilled syringe for manual use. What may interact with this medicine? Interactions have not  been studied. Give your health care provider a list of all the medicines, herbs, non-prescription drugs, or dietary supplements you use. Also tell them if you smoke, drink alcohol, or use illegal drugs. Some items may interact with  your medicine. This list may not describe all possible interactions. Give your health care provider a list of all the medicines, herbs, non-prescription drugs, or dietary supplements you use. Also tell them if you smoke, drink alcohol, or use illegal drugs. Some items may interact with your medicine. What should I watch for while using this medicine? You may need blood work done while you are taking this medicine. If you are going to need a MRI, CT scan, or other procedure, tell your doctor that you are using this medicine (On-Body Injector only). What side effects may I notice from receiving this medicine? Side effects that you should report to your doctor or health care professional as soon as possible: -allergic reactions like skin rash, itching or hives, swelling of the face, lips, or tongue -back pain -dizziness -fever -pain, redness, or irritation at site where injected -pinpoint red spots on the skin -red or dark-brown urine -shortness of breath or breathing problems -stomach or side pain, or pain at the shoulder -swelling -tiredness -trouble passing urine or change in the amount of urine Side effects that usually do not require medical attention (report to your doctor or health care professional if they continue or are bothersome): -bone pain -muscle pain This list may not describe all possible side effects. Call your doctor for medical advice about side effects. You may report side effects to FDA at 1-800-FDA-1088. Where should I keep my medicine? Keep out of the reach of children. If you are using this medicine at home, you will be instructed on how to store it. Throw away any unused medicine after the expiration date on the label. NOTE: This sheet is a summary. It may not cover all possible information. If you have questions about this medicine, talk to your doctor, pharmacist, or health care provider.  2019 Elsevier/Gold Standard (2017-07-30 16:57:08)

## 2018-09-16 NOTE — Progress Notes (Signed)
Patient labs report K+ is 2.7 today. Per Dr. Doris Cheadle to give 8mEq K+ in 155mL infusion and also sent in prescription for oral potassium.   Jalene Mullet, PharmD PGY2 Hematology/ Oncology Pharmacy Resident 09/16/2018 10:03 AM

## 2018-09-18 ENCOUNTER — Ambulatory Visit: Payer: Managed Care, Other (non HMO)

## 2018-09-19 ENCOUNTER — Other Ambulatory Visit: Payer: Self-pay | Admitting: Oncology

## 2018-09-19 ENCOUNTER — Telehealth: Payer: Self-pay | Admitting: Oncology

## 2018-09-19 ENCOUNTER — Encounter: Payer: Self-pay | Admitting: Oncology

## 2018-09-19 NOTE — Telephone Encounter (Signed)
Called patient regarding schedule

## 2018-09-20 ENCOUNTER — Telehealth: Payer: Self-pay | Admitting: Oncology

## 2018-09-20 NOTE — Telephone Encounter (Signed)
LVM for patient to call [facility] to get scheduled for appointment, also sent email with same information

## 2018-09-23 ENCOUNTER — Telehealth: Payer: Self-pay | Admitting: *Deleted

## 2018-09-23 ENCOUNTER — Ambulatory Visit
Admission: RE | Admit: 2018-09-23 | Discharge: 2018-09-23 | Disposition: A | Discharge status: Home / Self Care | Payer: Managed Care, Other (non HMO) | Source: Ambulatory Visit | Attending: Oncology | Admitting: Oncology

## 2018-09-23 ENCOUNTER — Other Ambulatory Visit: Payer: Self-pay

## 2018-09-23 DIAGNOSIS — C50811 Malignant neoplasm of overlapping sites of right female breast: Secondary | ICD-10-CM

## 2018-09-23 NOTE — Telephone Encounter (Signed)
R/s and confirmed new [place] appt for 09/23/18 at 10:30.

## 2018-09-28 ENCOUNTER — Other Ambulatory Visit: Payer: Self-pay | Admitting: Oncology

## 2018-09-28 DIAGNOSIS — Z170 Estrogen receptor positive status [ER+]: Secondary | ICD-10-CM

## 2018-09-28 DIAGNOSIS — C50811 Malignant neoplasm of overlapping sites of right female breast: Secondary | ICD-10-CM

## 2018-09-28 DIAGNOSIS — IMO0001 Reserved for inherently not codable concepts without codable children: Secondary | ICD-10-CM

## 2018-10-01 ENCOUNTER — Ambulatory Visit: Payer: Managed Care, Other (non HMO) | Admitting: Adult Health

## 2018-10-01 MED ORDER — LORAZEPAM 0.5 MG PO TABS: 0.5000 mg | ORAL_TABLET | Freq: Every evening | ORAL | 0 refills | Status: DC | PRN

## 2018-10-02 ENCOUNTER — Other Ambulatory Visit: Payer: Managed Care, Other (non HMO)

## 2018-10-02 ENCOUNTER — Ambulatory Visit: Payer: Managed Care, Other (non HMO)

## 2018-10-03 ENCOUNTER — Inpatient Hospital Stay: Payer: Managed Care, Other (non HMO)

## 2018-10-03 ENCOUNTER — Encounter: Payer: Self-pay | Admitting: *Deleted

## 2018-10-03 ENCOUNTER — Other Ambulatory Visit: Payer: Self-pay

## 2018-10-03 ENCOUNTER — Encounter: Payer: Self-pay | Admitting: Oncology

## 2018-10-03 ENCOUNTER — Encounter: Payer: Self-pay | Admitting: Adult Health

## 2018-10-03 ENCOUNTER — Inpatient Hospital Stay (HOSPITAL_BASED_OUTPATIENT_CLINIC_OR_DEPARTMENT_OTHER): Payer: Managed Care, Other (non HMO) | Admitting: Adult Health

## 2018-10-03 VITALS — BP 126/73 | HR 100 | Temp 98.9°F | Resp 18 | Ht 66.0 in | Wt 186.3 lb

## 2018-10-03 DIAGNOSIS — Z79899 Other long term (current) drug therapy: Secondary | ICD-10-CM

## 2018-10-03 DIAGNOSIS — Z5111 Encounter for antineoplastic chemotherapy: Secondary | ICD-10-CM

## 2018-10-03 DIAGNOSIS — E119 Type 2 diabetes mellitus without complications: Secondary | ICD-10-CM

## 2018-10-03 DIAGNOSIS — C50811 Malignant neoplasm of overlapping sites of right female breast: Secondary | ICD-10-CM

## 2018-10-03 DIAGNOSIS — Z794 Long term (current) use of insulin: Secondary | ICD-10-CM

## 2018-10-03 DIAGNOSIS — Z7689 Persons encountering health services in other specified circumstances: Secondary | ICD-10-CM

## 2018-10-03 DIAGNOSIS — Z95828 Presence of other vascular implants and grafts: Secondary | ICD-10-CM

## 2018-10-03 DIAGNOSIS — R61 Generalized hyperhidrosis: Secondary | ICD-10-CM

## 2018-10-03 DIAGNOSIS — Z171 Estrogen receptor negative status [ER-]: Secondary | ICD-10-CM | POA: Diagnosis not present

## 2018-10-03 DIAGNOSIS — M129 Arthropathy, unspecified: Secondary | ICD-10-CM

## 2018-10-03 DIAGNOSIS — I10 Essential (primary) hypertension: Secondary | ICD-10-CM

## 2018-10-03 DIAGNOSIS — IMO0001 Reserved for inherently not codable concepts without codable children: Secondary | ICD-10-CM

## 2018-10-03 DIAGNOSIS — Z87891 Personal history of nicotine dependence: Secondary | ICD-10-CM

## 2018-10-03 DIAGNOSIS — J029 Acute pharyngitis, unspecified: Secondary | ICD-10-CM | POA: Diagnosis not present

## 2018-10-03 DIAGNOSIS — E876 Hypokalemia: Secondary | ICD-10-CM

## 2018-10-03 DIAGNOSIS — Z170 Estrogen receptor positive status [ER+]: Secondary | ICD-10-CM

## 2018-10-03 DIAGNOSIS — Z801 Family history of malignant neoplasm of trachea, bronchus and lung: Secondary | ICD-10-CM

## 2018-10-03 DIAGNOSIS — C773 Secondary and unspecified malignant neoplasm of axilla and upper limb lymph nodes: Secondary | ICD-10-CM

## 2018-10-03 LAB — CBC WITH DIFFERENTIAL/PLATELET
Abs Immature Granulocytes: 0.33 10*3/uL — ABNORMAL HIGH (ref 0.00–0.07)
Basophils Absolute: 0.1 10*3/uL (ref 0.0–0.1)
Basophils Relative: 1 %
Eosinophils Absolute: 0.1 10*3/uL (ref 0.0–0.5)
Eosinophils Relative: 1 %
HCT: 29 % — ABNORMAL LOW (ref 36.0–46.0)
Hemoglobin: 9.4 g/dL — ABNORMAL LOW (ref 12.0–15.0)
Immature Granulocytes: 3 %
Lymphocytes Relative: 9 %
Lymphs Abs: 0.9 10*3/uL (ref 0.7–4.0)
MCH: 29.7 pg (ref 26.0–34.0)
MCHC: 32.4 g/dL (ref 30.0–36.0)
MCV: 91.8 fL (ref 80.0–100.0)
Monocytes Absolute: 1 10*3/uL (ref 0.1–1.0)
Monocytes Relative: 11 %
Neutro Abs: 7.3 10*3/uL (ref 1.7–7.7)
Neutrophils Relative %: 75 %
Platelets: 337 10*3/uL (ref 150–400)
RBC: 3.16 MIL/uL — ABNORMAL LOW (ref 3.87–5.11)
RDW: 19.8 % — ABNORMAL HIGH (ref 11.5–15.5)
WBC: 9.6 10*3/uL (ref 4.0–10.5)
nRBC: 0.3 % — ABNORMAL HIGH (ref 0.0–0.2)

## 2018-10-03 LAB — COMPREHENSIVE METABOLIC PANEL
ALT: 29 U/L (ref 0–44)
AST: 31 U/L (ref 15–41)
Albumin: 3.9 g/dL (ref 3.5–5.0)
Alkaline Phosphatase: 81 U/L (ref 38–126)
Anion gap: 10 (ref 5–15)
BUN: 14 mg/dL (ref 6–20)
CO2: 26 mmol/L (ref 22–32)
Calcium: 9.6 mg/dL (ref 8.9–10.3)
Chloride: 102 mmol/L (ref 98–111)
Creatinine, Ser: 0.8 mg/dL (ref 0.44–1.00)
GFR calc Af Amer: >60 mL/min (ref >60)
GFR calc non Af Amer: >60 mL/min (ref >60)
Glucose, Bld: 119 mg/dL — ABNORMAL HIGH (ref 70–99)
Potassium: 3.2 mmol/L — ABNORMAL LOW (ref 3.5–5.1)
Sodium: 138 mmol/L (ref 135–145)
Total Bilirubin: <0.2 mg/dL — ABNORMAL LOW (ref 0.3–1.2)
Total Protein: 7.6 g/dL (ref 6.5–8.1)

## 2018-10-03 MED ORDER — SODIUM CHLORIDE 0.9% FLUSH
10.0000 mL | Freq: Once | INTRAVENOUS | Status: AC
  Administered 2018-10-03: 10 mL
  Filled 2018-10-03: qty 10

## 2018-10-03 NOTE — Patient Instructions (Signed)
Nanoparticle Albumin-Bound Paclitaxel injection What is this medicine? NANOPARTICLE ALBUMIN-BOUND PACLITAXEL (Na no PAHR ti kuhl al BYOO muhn-bound PAK li TAX el) is a chemotherapy drug. It targets fast dividing cells, like cancer cells, and causes these cells to die. This medicine is used to treat advanced breast cancer, lung cancer, and pancreatic cancer. This medicine may be used for other purposes; ask your health care provider or pharmacist if you have questions. COMMON BRAND NAME(S): Abraxane What should I tell my health care provider before I take this medicine? They need to know if you have any of these conditions: -kidney disease -liver disease -low blood counts, like low white cell, platelet, or red cell counts -lung or breathing disease, like asthma -tingling of the fingers or toes, or other nerve disorder -an unusual or allergic reaction to paclitaxel, albumin, other chemotherapy, other medicines, foods, dyes, or preservatives -pregnant or trying to get pregnant -breast-feeding How should I use this medicine? This drug is given as an infusion into a vein. It is administered in a hospital or clinic by a specially trained health care professional. Talk to your pediatrician regarding the use of this medicine in children. Special care may be needed. Overdosage: If you think you have taken too much of this medicine contact a poison control center or emergency room at once. NOTE: This medicine is only for you. Do not share this medicine with others. What if I miss a dose? It is important not to miss your dose. Call your doctor or health care professional if you are unable to keep an appointment. What may interact with this medicine? This medicine may interact with the following medications: -antiviral medicines for hepatitis, HIV or AIDS -certain antibiotics like erythromycin and clarithromycin -certain medicines for fungal infections like ketoconazole and itraconazole -certain  medicines for seizures like carbamazepine, phenobarbital, phenytoin -gemfibrozil -nefazodone -rifampin -St. John's wort This list may not describe all possible interactions. Give your health care provider a list of all the medicines, herbs, non-prescription drugs, or dietary supplements you use. Also tell them if you smoke, drink alcohol, or use illegal drugs. Some items may interact with your medicine. What should I watch for while using this medicine? Your condition will be monitored carefully while you are receiving this medicine. You will need important blood work done while you are taking this medicine. This medicine can cause serious allergic reactions. If you experience allergic reactions like skin rash, itching or hives, swelling of the face, lips, or tongue, tell your doctor or health care professional right away. In some cases, you may be given additional medicines to help with side effects. Follow all directions for their use. This drug may make you feel generally unwell. This is not uncommon, as chemotherapy can affect healthy cells as well as cancer cells. Report any side effects. Continue your course of treatment even though you feel ill unless your doctor tells you to stop. Call your doctor or health care professional for advice if you get a fever, chills or sore throat, or other symptoms of a cold or flu. Do not treat yourself. This drug decreases your body's ability to fight infections. Try to avoid being around people who are sick. This medicine may increase your risk to bruise or bleed. Call your doctor or health care professional if you notice any unusual bleeding. Be careful brushing and flossing your teeth or using a toothpick because you may get an infection or bleed more easily. If you have any dental work done, tell your   dentist you are receiving this medicine. Avoid taking products that contain aspirin, acetaminophen, ibuprofen, naproxen, or ketoprofen unless instructed by  your doctor. These medicines may hide a fever. Do not become pregnant while taking this medicine or for 6 months after stopping it. Women should inform their doctor if they wish to become pregnant or think they might be pregnant. Men should not father a child while taking this medicine or for 3 months after stopping it. There is a potential for serious side effects to an unborn child. Talk to your health care professional or pharmacist for more information. Do not breast-feed an infant while taking this medicine or for 2 weeks after stopping it. This medicine may interfere with the ability to get pregnant or to father a child. You should talk to your doctor or health care professional if you are concerned about your fertility. What side effects may I notice from receiving this medicine? Side effects that you should report to your doctor or health care professional as soon as possible: -allergic reactions like skin rash, itching or hives, swelling of the face, lips, or tongue -breathing problems -changes in vision -fast, irregular heartbeat -low blood pressure -mouth sores -pain, tingling, numbness in the hands or feet -signs of decreased platelets or bleeding - bruising, pinpoint red spots on the skin, black, tarry stools, blood in the urine -signs of decreased red blood cells - unusually weak or tired, feeling faint or lightheaded, falls -signs of infection - fever or chills, cough, sore throat, pain or difficulty passing urine -signs and symptoms of liver injury like dark yellow or brown urine; general ill feeling or flu-like symptoms; light-colored stools; loss of appetite; nausea; right upper belly pain; unusually weak or tired; yellowing of the eyes or skin -swelling of the ankles, feet, hands -unusually slow heartbeat Side effects that usually do not require medical attention (report to your doctor or health care professional if they continue or are bothersome): -diarrhea -hair loss -loss  of appetite -nausea, vomiting -tiredness This list may not describe all possible side effects. Call your doctor for medical advice about side effects. You may report side effects to FDA at 1-800-FDA-1088. Where should I keep my medicine? This drug is given in a hospital or clinic and will not be stored at home. NOTE: This sheet is a summary. It may not cover all possible information. If you have questions about this medicine, talk to your doctor, pharmacist, or health care provider.  2019 Elsevier/Gold Standard (2016-12-26 13:03:45) Carboplatin injection What is this medicine? CARBOPLATIN (KAR boe pla tin) is a chemotherapy drug. It targets fast dividing cells, like cancer cells, and causes these cells to die. This medicine is used to treat ovarian cancer and many other cancers. This medicine may be used for other purposes; ask your health care provider or pharmacist if you have questions. COMMON BRAND NAME(S): Paraplatin What should I tell my health care provider before I take this medicine? They need to know if you have any of these conditions: -blood disorders -hearing problems -kidney disease -recent or ongoing radiation therapy -an unusual or allergic reaction to carboplatin, cisplatin, other chemotherapy, other medicines, foods, dyes, or preservatives -pregnant or trying to get pregnant -breast-feeding How should I use this medicine? This drug is usually given as an infusion into a vein. It is administered in a hospital or clinic by a specially trained health care professional. Talk to your pediatrician regarding the use of this medicine in children. Special care may be needed. Overdosage:  If you think you have taken too much of this medicine contact a poison control center or emergency room at once. NOTE: This medicine is only for you. Do not share this medicine with others. What if I miss a dose? It is important not to miss a dose. Call your doctor or health care professional if  you are unable to keep an appointment. What may interact with this medicine? -medicines for seizures -medicines to increase blood counts like filgrastim, pegfilgrastim, sargramostim -some antibiotics like amikacin, gentamicin, neomycin, streptomycin, tobramycin -vaccines Talk to your doctor or health care professional before taking any of these medicines: -acetaminophen -aspirin -ibuprofen -ketoprofen -naproxen This list may not describe all possible interactions. Give your health care provider a list of all the medicines, herbs, non-prescription drugs, or dietary supplements you use. Also tell them if you smoke, drink alcohol, or use illegal drugs. Some items may interact with your medicine. What should I watch for while using this medicine? Your condition will be monitored carefully while you are receiving this medicine. You will need important blood work done while you are taking this medicine. This drug may make you feel generally unwell. This is not uncommon, as chemotherapy can affect healthy cells as well as cancer cells. Report any side effects. Continue your course of treatment even though you feel ill unless your doctor tells you to stop. In some cases, you may be given additional medicines to help with side effects. Follow all directions for their use. Call your doctor or health care professional for advice if you get a fever, chills or sore throat, or other symptoms of a cold or flu. Do not treat yourself. This drug decreases your body's ability to fight infections. Try to avoid being around people who are sick. This medicine may increase your risk to bruise or bleed. Call your doctor or health care professional if you notice any unusual bleeding. Be careful brushing and flossing your teeth or using a toothpick because you may get an infection or bleed more easily. If you have any dental work done, tell your dentist you are receiving this medicine. Avoid taking products that contain  aspirin, acetaminophen, ibuprofen, naproxen, or ketoprofen unless instructed by your doctor. These medicines may hide a fever. Do not become pregnant while taking this medicine. Women should inform their doctor if they wish to become pregnant or think they might be pregnant. There is a potential for serious side effects to an unborn child. Talk to your health care professional or pharmacist for more information. Do not breast-feed an infant while taking this medicine. What side effects may I notice from receiving this medicine? Side effects that you should report to your doctor or health care professional as soon as possible: -allergic reactions like skin rash, itching or hives, swelling of the face, lips, or tongue -signs of infection - fever or chills, cough, sore throat, pain or difficulty passing urine -signs of decreased platelets or bleeding - bruising, pinpoint red spots on the skin, black, tarry stools, nosebleeds -signs of decreased red blood cells - unusually weak or tired, fainting spells, lightheadedness -breathing problems -changes in hearing -changes in vision -chest pain -high blood pressure -low blood counts - This drug may decrease the number of white blood cells, red blood cells and platelets. You may be at increased risk for infections and bleeding. -nausea and vomiting -pain, swelling, redness or irritation at the injection site -pain, tingling, numbness in the hands or feet -problems with balance, talking, walking -trouble passing  urine or change in the amount of urine Side effects that usually do not require medical attention (report to your doctor or health care professional if they continue or are bothersome): -hair loss -loss of appetite -metallic taste in the mouth or changes in taste This list may not describe all possible side effects. Call your doctor for medical advice about side effects. You may report side effects to FDA at 1-800-FDA-1088. Where should I keep  my medicine? This drug is given in a hospital or clinic and will not be stored at home. NOTE: This sheet is a summary. It may not cover all possible information. If you have questions about this medicine, talk to your doctor, pharmacist, or health care provider.  2019 Elsevier/Gold Standard (2007-07-30 14:38:05)

## 2018-10-03 NOTE — Progress Notes (Signed)
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  She continues on neoadjuvant chemotherapy consisting of doxorubicin and cyclophosphamide in dose dense fashion x4 followed by weekly Abraxane and carboplatin x12. Today is day 1 cycle 1 of Abraxane and Carboplatin.     Since her last visit, she underwent ultrasound of the right breast which was consistent with response to treatment.   Her two right breast masses were decreased by about 50%, and the lymph node had decreased by slightly more than 50%.    REVIEW OF SYSTEMS: Autumn Cook is doing well today.  She had increased fatigue following her final cycle of Doxorubicin and Cyclophospahmide, but is recovering well now and is feeling better.  She says that her dyshapgia has nearly resolved.  She is slightly constipated, but is drinking lemon water which is helping.  She notes that she still has night sweats.  She tried Gabapentin, but it made her feel horrible the next day and didn't help her hot flashes.  She has opted not to take this.  Autumn Cook wants to skip chemo today and get back on Tuesdays starting 6/2.  Autumn Cook denies any fever, chills, cough, shortness of breath, chest pain, palpitations, nausea,  vomiting, bowel/bladder changes, headaches, vision changes, rash, or any other concerns.  She has no baseline peripheral neuropathy.    HISTORY OF CURRENT ILLNESS: From the original intake note:  Autumn Cook had routine screening mammography on 07/01/2018 showing a possible abnormality in the right breast; she notes that did feel some pain and a lump in her breast leading up to the routine screening. She underwent right breast ultrasonography at [facility] on 07/05/2018 showing: A 1.4 x 1.2 x 1.4 cm oval hypoechoic mass at the 5:30 position 8 cm from the nipple. A 0.9 x 1 x 1 cm slightly irregular hypoechoic mass at the 10:30 position 9 cm from the nipple. A 3 x 1.9 x 3 cm probable lymph node without fatty hilum in the LOWER RIGHT axilla. A RIGHT axillary lymph node with slightly thickened cortex.  Accordingly on 07/10/2018 she proceeded to biopsy of the right breast area in question. The pathology from this procedure showed ([id number]): invasive ductal carcinoma, high grade, 5:30 o'clock. Prognostic indicators significant for: estrogen receptor, 30% positive with weak staining intensity and progesterone receptor, 0% negative. Proliferation marker Ki67 at 70%.   An additional biopsy was performed on the same day ([id number]) showing:  2. Breast, right, needle core biopsy, 10 o'clock - Invasive carcinoma  Finally, an additional biopsy was performed at the right axilla on the same day ([id number]) showing: invasive carcinoma, right axilla. Prognostic indicators significant for: estrogen receptor, 70% positive, with weak staining intensity and progesterone receptor, 0% negative. Proliferation marker Ki67 at 70%. HER2 positive by immunohistochemistry.    The patient's subsequent history is as detailed below.    PAST MEDICAL  HISTORY: Past Medical History:  Diagnosis Date   Allergy    Arthritis    Asthma    Cancer ([place])    R breast cancer   Diabetes mellitus without complication  ([place])    uncontrolled   Family history of adverse reaction to anesthesia    "entire family takes a long time to wake up- N&V for several days"   Family history of lung cancer    Hypertension    Migraine    PONV (postoperative nausea and vomiting)      PAST SURGICAL HISTORY: Past Surgical History:  Procedure Laterality Date   BREAST CYST EXCISION  1994   DILATION AND CURETTAGE OF UTERUS     PORTACATH PLACEMENT Right 08/01/2018   Procedure: INSERTION PORT-A-CATH WITH ULTRASOUND;  Surgeon: Autumn Overall, MD;  Location: [facility];  Service: General;  Laterality: Right;     FAMILY HISTORY: Family History  Problem Relation Age of Onset   Hypertension Father    Cancer Father 13       lung cancer   Stroke Father    Heart disease Mother        arrhythmia   Hypertension Mother    Kidney failure Sister    Autumn Cook's father died from lung cancer at age 42; he was a heavy smoker. Patients' mother is 61 as of 07/2018. The patient has 3 brothers and 2 sisters. Patient denies anyone in her family having breast, ovarian, prostate, or pancreatic cancer. Autumn Cook's maternal great great uncle was diagnosed with lung cancer.     GYNECOLOGIC HISTORY:  Patient's last menstrual period was 11/14/2013. 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 Her husband, Autumn Cook, is a Administrator. At home with them is two toy poodles. Olina has 4 children, Autumn Cook, Autumn Cook, and Autumn Cook. Autumn Cook is 55, lives in [place], and works in Science writer. Autumn Cook is 80, lives in [place], and works in Engineer, technical sales. Autumn Cook is 61, is taking care of Autumn Cook, and works as a Control and instrumentation engineer. Autumn Cook is 37, lives in [place], and is a Buyer, retail.   ADVANCED DIRECTIVES:  In the absence of any documentation, Autumn Cook's spouse, Autumn Cook, is her healthcare power of attorney.     HEALTH MAINTENANCE: Social History   Tobacco Use   Smoking status: Former Smoker    Packs/day: 1.00    Years: 5.00    Pack years: 5.00    Last attempt to quit: 05/08/1988    Years since quitting: 30.4   Smokeless tobacco: Never Used  Substance Use Topics   Alcohol use: No   Drug use: No    Colonoscopy: yes, 2010 - due 2020.  PAP: 2017  Bone density: yes, 2015, normal Mammography: 07/01/2018   Allergies  Allergen Reactions   Penicillins Hives    Did it involve swelling of the face/tongue/throat, SOB, or low BP? Yes--syncope episode Did it involve sudden or severe rash/hives, skin peeling, or any reaction on the inside of your mouth or nose? No Did you need to seek medical attention at a hospital or doctor's office? Yes When did it last happen? 37-38 YEARS AGO If all above answers are "NO", may proceed with cephalosporin use.    Metformin And Related Diarrhea   Adhesive [Tape] Other (  See Comments)    "severe bruising"  ALSO SKIN GLUE   Asa [Aspirin] Other (See Comments)    GI upset     Current Outpatient Medications  Medication Sig Dispense Refill   albuterol (PROVENTIL HFA;VENTOLIN HFA) 108 (90 Base) MCG/ACT inhaler Inhale 2 puffs into the lungs every 4 (four) hours as needed for wheezing or shortness of breath (cough, shortness of breath or wheezing.). 1 Inhaler 1   atenolol (TENORMIN) 100 MG tablet TAKE 1 TABLET BY MOUTH EVERY DAY (Patient taking differently: Take 100 mg by mouth every evening. ) 30 tablet 5   atorvastatin (LIPITOR) 20 MG tablet Take 1 tablet (20 mg total) by mouth daily. 90 tablet 3   blood glucose meter kit and supplies KIT Dispense based on patient and insurance preference. Use up to four times daily as directed. (FOR ICD-9 250.00, 250.01). 1 each 0   ciprofloxacin (CIPRO) 500 MG tablet Take 1 tablet (500 mg total) by mouth 2 (two)  times daily. 60 tablet 0   cycloSPORINE (RESTASIS) 0.05 % ophthalmic emulsion Place 1 drop into both eyes 2 (two) times daily. 1.5 mL 6   dapagliflozin propanediol (FARXIGA) 5 MG TABS tablet Take 5 mg by mouth daily. 30 tablet 6   fluconazole (DIFLUCAN) 100 MG tablet Take 1 tablet (100 mg total) by mouth daily. 15 tablet 1   gabapentin (NEURONTIN) 300 MG capsule Take 1 capsule (300 mg total) by mouth at bedtime. 90 capsule 4   glipiZIDE (GLUCOTROL) 5 MG tablet Start with 5 mg once a day, then increase to twice a day after 1 week.  Continue to increase as directed by MD 120 tablet 3   glucose blood (ACCU-CHEK ACTIVE STRIPS) test strip Use as instructed- check blood sugar up to 2x a day 100 each 12   insulin aspart (NOVOLOG FLEXPEN) 100 UNIT/ML FlexPen If glucose 250-300 give 4 units. If glucose 301- 350 give 6 units, if glucose 351- 400 give 8 units.  If higher than 400 give 10 units.   May repeat in one hour if glucose still over 400. 15 mL 3   Insulin Glargine (BASAGLAR KWIKPEN) 100 UNIT/ML SOPN INJECT 14 UNITS SUBCUTANEOUSLY DAILY (Patient taking differently: Inject 16 Units into the skin daily. ) 15 mL 3   Insulin Pen Needle (PEN NEEDLES) 32G X 4 MM MISC 1 each by Does not apply route daily. 100 each 3   Insulin Pen Needle (PEN NEEDLES) 32G X 4 MM MISC 1 each by Does not apply route daily. 100 each 3   lidocaine-prilocaine (EMLA) cream Apply to affected area once 30 g 3   lisinopril-hydrochlorothiazide (ZESTORETIC) 20-12.5 MG tablet Take 2 tablets by mouth daily. (Patient taking differently: Take 2 tablets by mouth every evening. ) 180 tablet 3   loratadine (CLARITIN) 10 MG tablet Take 1 tablet (10 mg total) by mouth daily.     LORazepam (ATIVAN) 0.5 MG tablet Take 1 tablet (0.5 mg total) by mouth at bedtime as needed (Nausea or vomiting). 30 tablet 0   magic mouthwash SOLN Take 5 mLs by mouth 4 (four) times daily as needed for mouth pain. 180 mL 3   omeprazole (PRILOSEC) 40 MG  capsule Take 1 capsule (40 mg total) by mouth daily. 60 capsule 1   ondansetron (ZOFRAN) 8 MG tablet Take 1 tablet (8 mg total) by mouth every 8 (eight) hours as needed for nausea or vomiting. 30 tablet 2   ondansetron (ZOFRAN) 8 MG tablet Take 1 tablet (8 mg  total) by mouth every 8 (eight) hours as needed for nausea or vomiting. 20 tablet 0   ONE TOUCH ULTRA TEST test strip Check sugar 2 times daily 100 each prn   ONETOUCH DELICA LANCETS 40C MISC Check sugar 2 times daily 100 each prn   potassium chloride SA (K-DUR) 20 MEQ tablet Take 1 tablet (20 mEq total) by mouth daily. 90 tablet 4   prochlorperazine (COMPAZINE) 10 MG tablet Take 1 tablet (10 mg total) by mouth every 6 (six) hours as needed (Nausea or vomiting). 30 tablet 1   valACYclovir (VALTREX) 500 MG tablet Take 1 tablet (500 mg total) by mouth daily. 10 tablet 1   Vitamin D, Ergocalciferol, (DRISDOL) 1.25 MG (50000 UT) CAPS capsule TAKE 1 CAPSULE BY MOUTH EVERY 7 DAYS (Patient taking differently: Take 50,000 Units by mouth every Saturday. ) 12 capsule 1   No current facility-administered medications for this visit.      OBJECTIVE:  Vitals:   10/03/18 1245  BP: 126/73  Pulse: 100  Resp: 18  Temp: 98.9 F (37.2 C)  SpO2: 100%     Body mass index is 30.07 kg/m.   Wt Readings from Last 3 Encounters:  10/03/18 186 lb 4.8 oz (84.5 kg)  09/16/18 188 lb 3.2 oz (85.4 kg)  09/02/18 187 lb 8 oz (85 kg)      ECOG FS:1 - Symptomatic but completely ambulatory GENERAL: Patient is a well appearing female in no acute distress HEENT:  Sclerae anicteric.  Oropharynx clear and moist. No ulcerations or evidence of oropharyngeal candidiasis. Neck is supple.  NODES:  No cervical, supraclavicular, or axillary lymphadenopathy palpated.  BREAST EXAM:  Deferred. LUNGS:  Clear to auscultation bilaterally.  No wheezes or rhonchi. HEART:  Regular rate and rhythm. No murmur appreciated. ABDOMEN:  Soft, nontender.  Positive, normoactive  bowel sounds. No organomegaly palpated. MSK:  No focal spinal tenderness to palpation. Full range of motion bilaterally in the upper extremities. EXTREMITIES:  No peripheral edema.   SKIN:  Clear with no obvious rashes or skin changes. No nail dyscrasia. NEURO:  Nonfocal. Well oriented.  Appropriate affect.     LAB RESULTS:  CMP     Component Value Date/Time   NA 138 10/03/2018 1148   K 3.2 (L) 10/03/2018 1148   CL 102 10/03/2018 1148   CO2 26 10/03/2018 1148   GLUCOSE 119 (H) 10/03/2018 1148   BUN 14 10/03/2018 1148   CREATININE 0.80 10/03/2018 1148   CREATININE 1.14 (H) 07/24/2018 0825   CREATININE 0.70 04/14/2015 1558   CALCIUM 9.6 10/03/2018 1148   PROT 7.6 10/03/2018 1148   ALBUMIN 3.9 10/03/2018 1148   AST 31 10/03/2018 1148   AST 12 (L) 07/24/2018 0825   ALT 29 10/03/2018 1148   ALT 15 07/24/2018 0825   ALKPHOS 81 10/03/2018 1148   BILITOT <0.2 (L) 10/03/2018 1148   BILITOT 0.5 07/24/2018 0825   GFRNONAA >60 10/03/2018 1148   GFRNONAA 54 (L) 07/24/2018 0825   GFRNONAA 87 03/27/2014 1130   GFRAA >60 10/03/2018 1148   GFRAA >60 07/24/2018 0825   GFRAA >89 03/27/2014 1130    No results found for: TOTALPROTELP, ALBUMINELP, A1GS, A2GS, BETS, BETA2SER, GAMS, MSPIKE, SPEI  No results found for: KPAFRELGTCHN, LAMBDASER, KAPLAMBRATIO  Lab Results  Component Value Date   WBC 9.6 10/03/2018   NEUTROABS 7.3 10/03/2018   HGB 9.4 (L) 10/03/2018   HCT 29.0 (L) 10/03/2018   MCV 91.8 10/03/2018   PLT 337 10/03/2018    @  LASTCHEMISTRY@  No results found for: LABCA2  No components found for: [id number]  No results for input(s): INR in the last 168 hours.  No results found for: LABCA2  No results found for: KWI097  No results found for: DZH299  No results found for: MEQ683  No results found for: CA2729  No components found for: HGQUANT  No results found for: CEA1 / No results found for: CEA1   No results found for: AFPTUMOR  No results found for:  [place]  No results found for: PSA1  Appointment on 10/03/2018  Component Date Value Ref Range Status   Sodium 10/03/2018 138  135 - 145 mmol/L Final   Potassium 10/03/2018 3.2* 3.5 - 5.1 mmol/L Final   Chloride 10/03/2018 102  98 - 111 mmol/L Final   CO2 10/03/2018 26  22 - 32 mmol/L Final   Glucose, Bld 10/03/2018 119* 70 - 99 mg/dL Final   BUN 10/03/2018 14  6 - 20 mg/dL Final   Creatinine, Ser 10/03/2018 0.80  0.44 - 1.00 mg/dL Final   Calcium 10/03/2018 9.6  8.9 - 10.3 mg/dL Final   Total Protein 10/03/2018 7.6  6.5 - 8.1 g/dL Final   Albumin 10/03/2018 3.9  3.5 - 5.0 g/dL Final   AST 10/03/2018 31  15 - 41 U/L Final   ALT 10/03/2018 29  0 - 44 U/L Final   Alkaline Phosphatase 10/03/2018 81  38 - 126 U/L Final   Total Bilirubin 10/03/2018 <0.2* 0.3 - 1.2 mg/dL Final   GFR calc non Af Amer 10/03/2018 >60  >60 mL/min Final   GFR calc Af Amer 10/03/2018 >60  >60 mL/min Final   Anion gap 10/03/2018 10  5 - 15 Final   Performed at [facility], [place] [address] [place], [place] [address]   WBC 10/03/2018 9.6  4.0 - 10.5 K/uL Final   RBC 10/03/2018 3.16* 3.87 - 5.11 MIL/uL Final   Hemoglobin 10/03/2018 9.4* 12.0 - 15.0 g/dL Final   HCT 10/03/2018 29.0* 36.0 - 46.0 % Final   MCV 10/03/2018 91.8  80.0 - 100.0 fL Final   MCH 10/03/2018 29.7  26.0 - 34.0 pg Final   MCHC 10/03/2018 32.4  30.0 - 36.0 g/dL Final   RDW 10/03/2018 19.8* 11.5 - 15.5 % Final   Platelets 10/03/2018 337  150 - 400 K/uL Final   nRBC 10/03/2018 0.3* 0.0 - 0.2 % Final   Neutrophils Relative % 10/03/2018 75  % Final   Neutro Abs 10/03/2018 7.3  1.7 - 7.7 K/uL Final   Lymphocytes Relative 10/03/2018 9  % Final   Lymphs Abs 10/03/2018 0.9  0.7 - 4.0 K/uL Final   Monocytes Relative 10/03/2018 11  % Final   Monocytes Absolute 10/03/2018 1.0  0.1 - 1.0 K/uL Final   Eosinophils Relative 10/03/2018 1  % Final   Eosinophils Absolute 10/03/2018 0.1  0.0 -  0.5 K/uL Final   Basophils Relative 10/03/2018 1  % Final   Basophils Absolute 10/03/2018 0.1  0.0 - 0.1 K/uL Final   Immature Granulocytes 10/03/2018 3  % Final   Abs Immature Granulocytes 10/03/2018 0.33* 0.00 - 0.07 K/uL Final   Performed at [facility], [place] [address] [place], [place] [address]    (this displays the last labs from the last 3 days)  No results found for: TOTALPROTELP, ALBUMINELP, A1GS, A2GS, BETS, BETA2SER, GAMS, MSPIKE, SPEI (this displays SPEP labs)  No results found for: KPAFRELGTCHN, LAMBDASER, KAPLAMBRATIO (kappa/lambda  light chains)  No results found for: HGBA, HGBA2QUANT, HGBFQUANT, HGBSQUAN (Hemoglobinopathy evaluation)   No results found for: LDH  No results found for: IRON, TIBC, IRONPCTSAT (Iron and TIBC)  No results found for: FERRITIN  Urinalysis    Component Value Date/Time   COLORURINE YELLOW 03/07/2016 1350   APPEARANCEUR CLEAR 03/07/2016 1350   LABSPEC 1.031 (H) 03/07/2016 1350   PHURINE 5.0 03/07/2016 1350   GLUCOSEU >1000 (A) 03/07/2016 1350   HGBUR NEGATIVE 03/07/2016 1350   BILIRUBINUR NEGATIVE 03/07/2016 1350   BILIRUBINUR neg 11/24/2013 1547   KETONESUR 15 (A) 03/07/2016 1350   PROTEINUR NEGATIVE 03/07/2016 1350   UROBILINOGEN 0.2 11/24/2013 1547   UROBILINOGEN 0.2 11/10/2006 1153   NITRITE NEGATIVE 03/07/2016 1350   LEUKOCYTESUR NEGATIVE 03/07/2016 1350     STUDIES:  US Breast Ltd Uni Right Inc Axilla  Result Date: 09/23/2018 CLINICAL DATA:  Follow-up ultrasound to evaluate response to neoadjuvant chemotherapy of 2 sites of known malignancy in the right breast. EXAM: ULTRASOUND OF THE RIGHT BREAST COMPARISON:  Previous exam(s). FINDINGS: The right breast mass at 530, 8 cm from the nipple measures 1.1 x 0.6 x 0.8 cm, previously measuring 1.4 x 1.2 x 1.4 cm. The right breast mass at 10:30, 9 cm from the nipple measures 0.6 x 0.3 x 0.5 cm, previously measuring 1.0 x 0.9 x 1.0 cm. The lymph node in  the lower right axilla today measures 1.4 x 0.7 x 1.1 cm, previously measuring 3.0 x 1.9 x 3.0 cm. IMPRESSION: Interval decrease in size of the 2 sites of malignancy in the right breast, as well as significant decrease in size of the metastatic right axillary lymph node. RECOMMENDATION: Continue treatment plan. I have discussed the findings and recommendations with the patient. Results were also provided in writing at the conclusion of the visit. If applicable, a reminder letter will be sent to the patient regarding the next appointment. BI-RADS CATEGORY  6: Known biopsy-proven malignancy. Electronically Signed   By: Ammie Ferrier M.D.   On: 09/23/2018 12:04     ELIGIBLE FOR AVAILABLE RESEARCH PROTOCOL: No   ASSESSMENT: 58 y.o. [place], [place] woman status post right breast biopsy x2 for multicentric invasive ductal carcinoma, clinically T1c N1, stage IIB, grade 3, functionally triple negative, with an MIB-1 of 70%  (a) right axillary lymph node biopsied at the same time was positive  (1) neoadjuvant chemotherapy will consist of doxorubicin and cyclophosphamide in dose dense fashion x4 followed by weekly Abraxane and carboplatin x12 starting 08/07/2018  (2) definitive surgery to follow  (3) adjuvant radiation as appropriate  (4) genetics testing 08/06/2018 through the Breast + GYN Cancers Panel offered by [facility] found no deleterious mutations in ATM, BARD1, BRCA1, BRCA2, BRIP1, CDH1, CHEK2, DICER1, EPCAM, MLH1, MSH2, MSH6, NBN, NF1, PALB2, PMS2, PTEN, RAD50, RAD51C, RAD51D, SMARCA4, STK11, TP53.     PLAN: Sarinah is doing well today.  I reviewed her ultrasound results and that her tumor is shrinking, which is what we want.  This is good news.  She and I reviewed her upcoming chemotherapy regimen consisting of Abraxane and Carboplatin.  I reviewed risks and benefits, in particular peripheral neuropathy in detail.  We reviewed icing of her fingers and toes.  She understands this, and will  bring bags to her next treatment.  After discussing risks and benefits she is agreeable to proceed with treatment.   Seairra and I reviewed how to take her Compazine following her treatment.  After review of her treatment dates, she  has opted to skip treatment today and will return on 6/2 for labs and her first chemotherapy with Abraxane and Carboplatin.  This is reasonable.    For her night sweats, she has opted not to take Gabapentin which made her feel poorly.  I reviewed things that may make hot flashes worse such as ETOH, hot beverages, caffeine, hot/spice foods, tobacco.  She is managing these for now.    I encouraged that she continue with exercise, even if only briefly every day as it will help keep her conditioned and help with any fatigue.  Her potassium is improved.  I recommended that she continue taking her potassium supplement.   Quisha has a good understanding of the above plan and is in agreement with it.  We will see her back prior to her 6/9 chemotherapy to evaluate her side effects.  She knows to call for any other issues that may develop before the next visit.  A total of (30) minutes of face-to-face time was spent with this patient with greater than 50% of that time in counseling and care-coordination.   Wilber Bihari, NP  10/03/18 1:25 PM Medical Oncology and Hematology [facility] [address] [place], [place] [address] Tel. [phone]    Fax. [phone]

## 2018-10-04 ENCOUNTER — Telehealth: Payer: Self-pay | Admitting: Oncology

## 2018-10-04 NOTE — Telephone Encounter (Signed)
Tried to reach regarding schedule °

## 2018-10-08 ENCOUNTER — Inpatient Hospital Stay: Payer: Managed Care, Other (non HMO)

## 2018-10-08 ENCOUNTER — Ambulatory Visit: Payer: Managed Care, Other (non HMO) | Admitting: Oncology

## 2018-10-08 ENCOUNTER — Other Ambulatory Visit: Payer: Self-pay

## 2018-10-08 ENCOUNTER — Inpatient Hospital Stay: Payer: Managed Care, Other (non HMO) | Attending: Oncology

## 2018-10-08 VITALS — BP 138/81 | HR 69 | Temp 98.2°F | Resp 18

## 2018-10-08 DIAGNOSIS — E119 Type 2 diabetes mellitus without complications: Secondary | ICD-10-CM | POA: Insufficient documentation

## 2018-10-08 DIAGNOSIS — I10 Essential (primary) hypertension: Secondary | ICD-10-CM | POA: Diagnosis not present

## 2018-10-08 DIAGNOSIS — Z79899 Other long term (current) drug therapy: Secondary | ICD-10-CM | POA: Diagnosis not present

## 2018-10-08 DIAGNOSIS — IMO0001 Reserved for inherently not codable concepts without codable children: Secondary | ICD-10-CM

## 2018-10-08 DIAGNOSIS — Z87891 Personal history of nicotine dependence: Secondary | ICD-10-CM | POA: Diagnosis not present

## 2018-10-08 DIAGNOSIS — R5383 Other fatigue: Secondary | ICD-10-CM | POA: Insufficient documentation

## 2018-10-08 DIAGNOSIS — C50811 Malignant neoplasm of overlapping sites of right female breast: Secondary | ICD-10-CM | POA: Diagnosis not present

## 2018-10-08 DIAGNOSIS — Z801 Family history of malignant neoplasm of trachea, bronchus and lung: Secondary | ICD-10-CM | POA: Diagnosis not present

## 2018-10-08 DIAGNOSIS — J45909 Unspecified asthma, uncomplicated: Secondary | ICD-10-CM | POA: Diagnosis not present

## 2018-10-08 DIAGNOSIS — Z170 Estrogen receptor positive status [ER+]: Secondary | ICD-10-CM

## 2018-10-08 DIAGNOSIS — Z5111 Encounter for antineoplastic chemotherapy: Secondary | ICD-10-CM | POA: Insufficient documentation

## 2018-10-08 DIAGNOSIS — Z171 Estrogen receptor negative status [ER-]: Secondary | ICD-10-CM | POA: Insufficient documentation

## 2018-10-08 DIAGNOSIS — G629 Polyneuropathy, unspecified: Secondary | ICD-10-CM | POA: Diagnosis not present

## 2018-10-08 DIAGNOSIS — Z95828 Presence of other vascular implants and grafts: Secondary | ICD-10-CM

## 2018-10-08 DIAGNOSIS — Z794 Long term (current) use of insulin: Secondary | ICD-10-CM | POA: Diagnosis not present

## 2018-10-08 LAB — CMP (CANCER CENTER ONLY)
ALT: 23 U/L (ref 0–44)
AST: 17 U/L (ref 15–41)
Albumin: 3.7 g/dL (ref 3.5–5.0)
Alkaline Phosphatase: 69 U/L (ref 38–126)
Anion gap: 10 (ref 5–15)
BUN: 15 mg/dL (ref 6–20)
CO2: 25 mmol/L (ref 22–32)
Calcium: 9.9 mg/dL (ref 8.9–10.3)
Chloride: 106 mmol/L (ref 98–111)
Creatinine: 0.72 mg/dL (ref 0.44–1.00)
GFR, Est AFR Am: >60 mL/min (ref >60)
GFR, Estimated: >60 mL/min (ref >60)
Glucose, Bld: 126 mg/dL — ABNORMAL HIGH (ref 70–99)
Potassium: 3.3 mmol/L — ABNORMAL LOW (ref 3.5–5.1)
Sodium: 141 mmol/L (ref 135–145)
Total Bilirubin: <0.2 mg/dL — ABNORMAL LOW (ref 0.3–1.2)
Total Protein: 7.1 g/dL (ref 6.5–8.1)

## 2018-10-08 LAB — CBC WITH DIFFERENTIAL/PLATELET
Abs Immature Granulocytes: 0.03 10*3/uL (ref 0.00–0.07)
Basophils Absolute: 0 10*3/uL (ref 0.0–0.1)
Basophils Relative: 1 %
Eosinophils Absolute: 0.1 10*3/uL (ref 0.0–0.5)
Eosinophils Relative: 1 %
HCT: 29.5 % — ABNORMAL LOW (ref 36.0–46.0)
Hemoglobin: 9.5 g/dL — ABNORMAL LOW (ref 12.0–15.0)
Immature Granulocytes: 1 %
Lymphocytes Relative: 9 %
Lymphs Abs: 0.5 10*3/uL — ABNORMAL LOW (ref 0.7–4.0)
MCH: 30.2 pg (ref 26.0–34.0)
MCHC: 32.2 g/dL (ref 30.0–36.0)
MCV: 93.7 fL (ref 80.0–100.0)
Monocytes Absolute: 0.7 10*3/uL (ref 0.1–1.0)
Monocytes Relative: 12 %
Neutro Abs: 4.5 10*3/uL (ref 1.7–7.7)
Neutrophils Relative %: 76 %
Platelets: 396 10*3/uL (ref 150–400)
RBC: 3.15 MIL/uL — ABNORMAL LOW (ref 3.87–5.11)
RDW: 19.7 % — ABNORMAL HIGH (ref 11.5–15.5)
WBC: 5.9 10*3/uL (ref 4.0–10.5)
nRBC: 0 % (ref 0.0–0.2)

## 2018-10-08 MED ORDER — HEPARIN SOD (PORK) LOCK FLUSH 100 UNIT/ML IV SOLN
500.0000 [IU] | Freq: Once | INTRAVENOUS | Status: AC | PRN
  Administered 2018-10-08: 500 [IU]
  Filled 2018-10-08: qty 5

## 2018-10-08 MED ORDER — DIPHENHYDRAMINE HCL 50 MG/ML IJ SOLN
INTRAMUSCULAR | Status: AC
  Filled 2018-10-08: qty 1

## 2018-10-08 MED ORDER — SODIUM CHLORIDE 0.9% FLUSH
10.0000 mL | INTRAVENOUS | Status: DC | PRN
  Administered 2018-10-08: 14:00:00 10 mL
  Filled 2018-10-08: qty 10

## 2018-10-08 MED ORDER — DIPHENHYDRAMINE HCL 50 MG/ML IJ SOLN
25.0000 mg | Freq: Once | INTRAMUSCULAR | Status: AC
  Administered 2018-10-08: 25 mg via INTRAVENOUS

## 2018-10-08 MED ORDER — SODIUM CHLORIDE 0.9% FLUSH
10.0000 mL | Freq: Once | INTRAVENOUS | Status: AC
  Administered 2018-10-08: 10 mL
  Filled 2018-10-08: qty 10

## 2018-10-08 MED ORDER — SODIUM CHLORIDE 0.9 % IV SOLN
273.2000 mg | Freq: Once | INTRAVENOUS | Status: AC
  Administered 2018-10-08: 270 mg via INTRAVENOUS
  Filled 2018-10-08: qty 27

## 2018-10-08 MED ORDER — SODIUM CHLORIDE 0.9 % IV SOLN
Freq: Once | INTRAVENOUS | Status: AC
  Administered 2018-10-08: 12:00:00 via INTRAVENOUS
  Filled 2018-10-08: qty 250

## 2018-10-08 MED ORDER — PACLITAXEL PROTEIN-BOUND CHEMO INJECTION 100 MG
100.0000 mg/m2 | Freq: Once | INTRAVENOUS | Status: AC
  Administered 2018-10-08: 13:00:00 200 mg via INTRAVENOUS
  Filled 2018-10-08: qty 40

## 2018-10-08 MED ORDER — PALONOSETRON HCL INJECTION 0.25 MG/5ML
0.2500 mg | Freq: Once | INTRAVENOUS | Status: AC
  Administered 2018-10-08: 12:00:00 0.25 mg via INTRAVENOUS

## 2018-10-08 MED ORDER — PALONOSETRON HCL INJECTION 0.25 MG/5ML
INTRAVENOUS | Status: AC
  Filled 2018-10-08: qty 5

## 2018-10-08 NOTE — Patient Instructions (Addendum)
[facility]cer Center Discharge Instructions for Patients Receiving Chemotherapy  Today you received the following chemotherapy agents Abraxane and Carboplatin  To help prevent nausea and vomiting after your treatment, we encourage you to take your nausea medication as directed.   If you develop nausea and vomiting that is not controlled by your nausea medication, call the clinic.   BELOW ARE SYMPTOMS THAT SHOULD BE REPORTED IMMEDIATELY:  *FEVER GREATER THAN 100.5 F  *CHILLS WITH OR WITHOUT FEVER  NAUSEA AND VOMITING THAT IS NOT CONTROLLED WITH YOUR NAUSEA MEDICATION  *UNUSUAL SHORTNESS OF BREATH  *UNUSUAL BRUISING OR BLEEDING  TENDERNESS IN MOUTH AND THROAT WITH OR WITHOUT PRESENCE OF ULCERS  *URINARY PROBLEMS  *BOWEL PROBLEMS  UNUSUAL RASH Items with * indicate a potential emergency and should be followed up as soon as possible.  Feel free to call the clinic should you have any questions or concerns. The clinic phone number is [phone].  Please show the CHEMO ALERT CARD at check-in to the Emergency Department and triage nurse.  Nanoparticle Albumin-Bound Paclitaxel injection What is this medicine? NANOPARTICLE ALBUMIN-BOUND PACLITAXEL (Na no PAHR ti kuhl al BYOO muhn-bound PAK li TAX el) is a chemotherapy drug. It targets fast dividing cells, like cancer cells, and causes these cells to die. This medicine is used to treat advanced breast cancer, lung cancer, and pancreatic cancer. This medicine may be used for other purposes; ask your health care provider or pharmacist if you have questions. COMMON BRAND NAME(S): Abraxane What should I tell my health care provider before I take this medicine? They need to know if you have any of these conditions: -kidney disease -liver disease -low blood counts, like low white cell, platelet, or red cell counts -lung or breathing disease, like asthma -tingling of the fingers or toes, or other nerve disorder -an unusual or  allergic reaction to paclitaxel, albumin, other chemotherapy, other medicines, foods, dyes, or preservatives -pregnant or trying to get pregnant -breast-feeding How should I use this medicine? This drug is given as an infusion into a vein. It is administered in a hospital or clinic by a specially trained health care professional. Talk to your pediatrician regarding the use of this medicine in children. Special care may be needed. Overdosage: If you think you have taken too much of this medicine contact a poison control center or emergency room at once. NOTE: This medicine is only for you. Do not share this medicine with others. What if I miss a dose? It is important not to miss your dose. Call your doctor or health care professional if you are unable to keep an appointment. What may interact with this medicine? This medicine may interact with the following medications: -antiviral medicines for hepatitis, HIV or AIDS -certain antibiotics like erythromycin and clarithromycin -certain medicines for fungal infections like ketoconazole and itraconazole -certain medicines for seizures like carbamazepine, phenobarbital, phenytoin -gemfibrozil -nefazodone -rifampin -St. John's wort This list may not describe all possible interactions. Give your health care provider a list of all the medicines, herbs, non-prescription drugs, or dietary supplements you use. Also tell them if you smoke, drink alcohol, or use illegal drugs. Some items may interact with your medicine. What should I watch for while using this medicine? Your condition will be monitored carefully while you are receiving this medicine. You will need important blood work done while you are taking this medicine. This medicine can cause serious allergic reactions. If you experience allergic reactions like skin rash, itching or hives, swelling of   the face, lips, or tongue, tell your doctor or health care professional right away. In some cases,  you may be given additional medicines to help with side effects. Follow all directions for their use. This drug may make you feel generally unwell. This is not uncommon, as chemotherapy can affect healthy cells as well as cancer cells. Report any side effects. Continue your course of treatment even though you feel ill unless your doctor tells you to stop. Call your doctor or health care professional for advice if you get a fever, chills or sore throat, or other symptoms of a cold or flu. Do not treat yourself. This drug decreases your body's ability to fight infections. Try to avoid being around people who are sick. This medicine may increase your risk to bruise or bleed. Call your doctor or health care professional if you notice any unusual bleeding. Be careful brushing and flossing your teeth or using a toothpick because you may get an infection or bleed more easily. If you have any dental work done, tell your dentist you are receiving this medicine. Avoid taking products that contain aspirin, acetaminophen, ibuprofen, naproxen, or ketoprofen unless instructed by your doctor. These medicines may hide a fever. Do not become pregnant while taking this medicine or for 6 months after stopping it. Women should inform their doctor if they wish to become pregnant or think they might be pregnant. Men should not father a child while taking this medicine or for 3 months after stopping it. There is a potential for serious side effects to an unborn child. Talk to your health care professional or pharmacist for more information. Do not breast-feed an infant while taking this medicine or for 2 weeks after stopping it. This medicine may interfere with the ability to get pregnant or to father a child. You should talk to your doctor or health care professional if you are concerned about your fertility. What side effects may I notice from receiving this medicine? Side effects that you should report to your doctor or  health care professional as soon as possible: -allergic reactions like skin rash, itching or hives, swelling of the face, lips, or tongue -breathing problems -changes in vision -fast, irregular heartbeat -low blood pressure -mouth sores -pain, tingling, numbness in the hands or feet -signs of decreased platelets or bleeding - bruising, pinpoint red spots on the skin, black, tarry stools, blood in the urine -signs of decreased red blood cells - unusually weak or tired, feeling faint or lightheaded, falls -signs of infection - fever or chills, cough, sore throat, pain or difficulty passing urine -signs and symptoms of liver injury like dark yellow or brown urine; general ill feeling or flu-like symptoms; light-colored stools; loss of appetite; nausea; right upper belly pain; unusually weak or tired; yellowing of the eyes or skin -swelling of the ankles, feet, hands -unusually slow heartbeat Side effects that usually do not require medical attention (report to your doctor or health care professional if they continue or are bothersome): -diarrhea -hair loss -loss of appetite -nausea, vomiting -tiredness This list may not describe all possible side effects. Call your doctor for medical advice about side effects. You may report side effects to FDA at 1-800-FDA-1088. Where should I keep my medicine? This drug is given in a hospital or clinic and will not be stored at home. NOTE: This sheet is a summary. It may not cover all possible information. If you have questions about this medicine, talk to your doctor, pharmacist, or health care   provider.  2019 [employer] (2016-12-26 13:03:45)  Carboplatin injection What is this medicine? CARBOPLATIN (KAR boe pla tin) is a chemotherapy drug. It targets fast dividing cells, like cancer cells, and causes these cells to die. This medicine is used to treat ovarian cancer and many other cancers. This medicine may be used for other purposes; ask  your health care provider or pharmacist if you have questions. COMMON BRAND NAME(S): Paraplatin What should I tell my health care provider before I take this medicine? They need to know if you have any of these conditions: -blood disorders -hearing problems -kidney disease -recent or ongoing radiation therapy -an unusual or allergic reaction to carboplatin, cisplatin, other chemotherapy, other medicines, foods, dyes, or preservatives -pregnant or trying to get pregnant -breast-feeding How should I use this medicine? This drug is usually given as an infusion into a vein. It is administered in a hospital or clinic by a specially trained health care professional. Talk to your pediatrician regarding the use of this medicine in children. Special care may be needed. Overdosage: If you think you have taken too much of this medicine contact a poison control center or emergency room at once. NOTE: This medicine is only for you. Do not share this medicine with others. What if I miss a dose? It is important not to miss a dose. Call your doctor or health care professional if you are unable to keep an appointment. What may interact with this medicine? -medicines for seizures -medicines to increase blood counts like filgrastim, pegfilgrastim, sargramostim -some antibiotics like amikacin, gentamicin, neomycin, streptomycin, tobramycin -vaccines Talk to your doctor or health care professional before taking any of these medicines: -acetaminophen -aspirin -ibuprofen -ketoprofen -naproxen This list may not describe all possible interactions. Give your health care provider a list of all the medicines, herbs, non-prescription drugs, or dietary supplements you use. Also tell them if you smoke, drink alcohol, or use illegal drugs. Some items may interact with your medicine. What should I watch for while using this medicine? Your condition will be monitored carefully while you are receiving this medicine. You  will need important blood work done while you are taking this medicine. This drug may make you feel generally unwell. This is not uncommon, as chemotherapy can affect healthy cells as well as cancer cells. Report any side effects. Continue your course of treatment even though you feel ill unless your doctor tells you to stop. In some cases, you may be given additional medicines to help with side effects. Follow all directions for their use. Call your doctor or health care professional for advice if you get a fever, chills or sore throat, or other symptoms of a cold or flu. Do not treat yourself. This drug decreases your body's ability to fight infections. Try to avoid being around people who are sick. This medicine may increase your risk to bruise or bleed. Call your doctor or health care professional if you notice any unusual bleeding. Be careful brushing and flossing your teeth or using a toothpick because you may get an infection or bleed more easily. If you have any dental work done, tell your dentist you are receiving this medicine. Avoid taking products that contain aspirin, acetaminophen, ibuprofen, naproxen, or ketoprofen unless instructed by your doctor. These medicines may hide a fever. Do not become pregnant while taking this medicine. Women should inform their doctor if they wish to become pregnant or think they might be pregnant. There is a potential for serious side effects to an   unborn child. Talk to your health care professional or pharmacist for more information. Do not breast-feed an infant while taking this medicine. What side effects may I notice from receiving this medicine? Side effects that you should report to your doctor or health care professional as soon as possible: -allergic reactions like skin rash, itching or hives, swelling of the face, lips, or tongue -signs of infection - fever or chills, cough, sore throat, pain or difficulty passing urine -signs of decreased platelets  or bleeding - bruising, pinpoint red spots on the skin, black, tarry stools, nosebleeds -signs of decreased red blood cells - unusually weak or tired, fainting spells, lightheadedness -breathing problems -changes in hearing -changes in vision -chest pain -high blood pressure -low blood counts - This drug may decrease the number of white blood cells, red blood cells and platelets. You may be at increased risk for infections and bleeding. -nausea and vomiting -pain, swelling, redness or irritation at the injection site -pain, tingling, numbness in the hands or feet -problems with balance, talking, walking -trouble passing urine or change in the amount of urine Side effects that usually do not require medical attention (report to your doctor or health care professional if they continue or are bothersome): -hair loss -loss of appetite -metallic taste in the mouth or changes in taste This list may not describe all possible side effects. Call your doctor for medical advice about side effects. You may report side effects to FDA at 1-800-FDA-1088. Where should I keep my medicine? This drug is given in a hospital or clinic and will not be stored at home. NOTE: This sheet is a summary. It may not cover all possible information. If you have questions about this medicine, talk to your doctor, pharmacist, or health care provider.  2019 [employer] Standard (2007-07-30 14:38:05)     

## 2018-10-08 NOTE — Patient Instructions (Signed)

## 2018-10-09 ENCOUNTER — Other Ambulatory Visit: Payer: Managed Care, Other (non HMO)

## 2018-10-12 ENCOUNTER — Encounter: Payer: Self-pay | Admitting: Oncology

## 2018-10-15 ENCOUNTER — Inpatient Hospital Stay: Payer: Managed Care, Other (non HMO)

## 2018-10-15 ENCOUNTER — Other Ambulatory Visit: Payer: Self-pay | Admitting: Oncology

## 2018-10-15 ENCOUNTER — Other Ambulatory Visit: Payer: Self-pay

## 2018-10-15 ENCOUNTER — Inpatient Hospital Stay (HOSPITAL_BASED_OUTPATIENT_CLINIC_OR_DEPARTMENT_OTHER): Payer: Managed Care, Other (non HMO) | Admitting: Adult Health

## 2018-10-15 ENCOUNTER — Encounter: Payer: Self-pay | Admitting: Adult Health

## 2018-10-15 VITALS — BP 147/92 | HR 88 | Temp 98.9°F | Resp 20 | Ht 66.0 in | Wt 188.5 lb

## 2018-10-15 DIAGNOSIS — E119 Type 2 diabetes mellitus without complications: Secondary | ICD-10-CM

## 2018-10-15 DIAGNOSIS — C50811 Malignant neoplasm of overlapping sites of right female breast: Secondary | ICD-10-CM

## 2018-10-15 DIAGNOSIS — J45909 Unspecified asthma, uncomplicated: Secondary | ICD-10-CM

## 2018-10-15 DIAGNOSIS — Z87891 Personal history of nicotine dependence: Secondary | ICD-10-CM

## 2018-10-15 DIAGNOSIS — Z171 Estrogen receptor negative status [ER-]: Secondary | ICD-10-CM

## 2018-10-15 DIAGNOSIS — R5383 Other fatigue: Secondary | ICD-10-CM

## 2018-10-15 DIAGNOSIS — IMO0001 Reserved for inherently not codable concepts without codable children: Secondary | ICD-10-CM

## 2018-10-15 DIAGNOSIS — Z79899 Other long term (current) drug therapy: Secondary | ICD-10-CM

## 2018-10-15 DIAGNOSIS — Z170 Estrogen receptor positive status [ER+]: Secondary | ICD-10-CM

## 2018-10-15 DIAGNOSIS — I10 Essential (primary) hypertension: Secondary | ICD-10-CM

## 2018-10-15 DIAGNOSIS — R05 Cough: Secondary | ICD-10-CM

## 2018-10-15 DIAGNOSIS — R053 Chronic cough: Secondary | ICD-10-CM

## 2018-10-15 DIAGNOSIS — Z801 Family history of malignant neoplasm of trachea, bronchus and lung: Secondary | ICD-10-CM

## 2018-10-15 DIAGNOSIS — Z794 Long term (current) use of insulin: Secondary | ICD-10-CM

## 2018-10-15 DIAGNOSIS — Z5111 Encounter for antineoplastic chemotherapy: Secondary | ICD-10-CM | POA: Diagnosis not present

## 2018-10-15 DIAGNOSIS — R0602 Shortness of breath: Secondary | ICD-10-CM

## 2018-10-15 DIAGNOSIS — Z95828 Presence of other vascular implants and grafts: Secondary | ICD-10-CM

## 2018-10-15 LAB — CBC WITH DIFFERENTIAL/PLATELET
Abs Immature Granulocytes: 0.03 10*3/uL (ref 0.00–0.07)
Basophils Absolute: 0 10*3/uL (ref 0.0–0.1)
Basophils Relative: 1 %
Eosinophils Absolute: 0.1 10*3/uL (ref 0.0–0.5)
Eosinophils Relative: 2 %
HCT: 28.3 % — ABNORMAL LOW (ref 36.0–46.0)
Hemoglobin: 9.3 g/dL — ABNORMAL LOW (ref 12.0–15.0)
Immature Granulocytes: 1 %
Lymphocytes Relative: 12 %
Lymphs Abs: 0.5 10*3/uL — ABNORMAL LOW (ref 0.7–4.0)
MCH: 30.7 pg (ref 26.0–34.0)
MCHC: 32.9 g/dL (ref 30.0–36.0)
MCV: 93.4 fL (ref 80.0–100.0)
Monocytes Absolute: 0.3 10*3/uL (ref 0.1–1.0)
Monocytes Relative: 8 %
Neutro Abs: 3 10*3/uL (ref 1.7–7.7)
Neutrophils Relative %: 76 %
Platelets: 308 10*3/uL (ref 150–400)
RBC: 3.03 MIL/uL — ABNORMAL LOW (ref 3.87–5.11)
RDW: 18 % — ABNORMAL HIGH (ref 11.5–15.5)
WBC: 3.9 10*3/uL — ABNORMAL LOW (ref 4.0–10.5)
nRBC: 0 % (ref 0.0–0.2)

## 2018-10-15 LAB — COMPREHENSIVE METABOLIC PANEL
ALT: 25 U/L (ref 0–44)
AST: 27 U/L (ref 15–41)
Albumin: 3.8 g/dL (ref 3.5–5.0)
Alkaline Phosphatase: 54 U/L (ref 38–126)
Anion gap: 12 (ref 5–15)
BUN: 10 mg/dL (ref 6–20)
CO2: 26 mmol/L (ref 22–32)
Calcium: 10.1 mg/dL (ref 8.9–10.3)
Chloride: 102 mmol/L (ref 98–111)
Creatinine, Ser: 0.78 mg/dL (ref 0.44–1.00)
GFR calc Af Amer: >60 mL/min (ref >60)
GFR calc non Af Amer: >60 mL/min (ref >60)
Glucose, Bld: 134 mg/dL — ABNORMAL HIGH (ref 70–99)
Potassium: 3.6 mmol/L (ref 3.5–5.1)
Sodium: 140 mmol/L (ref 135–145)
Total Bilirubin: 0.3 mg/dL (ref 0.3–1.2)
Total Protein: 7.3 g/dL (ref 6.5–8.1)

## 2018-10-15 MED ORDER — PACLITAXEL PROTEIN-BOUND CHEMO INJECTION 100 MG
100.0000 mg/m2 | Freq: Once | INTRAVENOUS | Status: AC
  Administered 2018-10-15: 12:00:00 200 mg via INTRAVENOUS
  Filled 2018-10-15: qty 40

## 2018-10-15 MED ORDER — SODIUM CHLORIDE 0.9% FLUSH
10.0000 mL | INTRAVENOUS | Status: DC | PRN
  Administered 2018-10-15: 10 mL
  Filled 2018-10-15: qty 10

## 2018-10-15 MED ORDER — DIPHENHYDRAMINE HCL 50 MG/ML IJ SOLN
12.5000 mg | Freq: Once | INTRAMUSCULAR | Status: AC
  Administered 2018-10-15: 12.5 mg via INTRAVENOUS

## 2018-10-15 MED ORDER — SODIUM CHLORIDE 0.9% FLUSH
10.0000 mL | Freq: Once | INTRAVENOUS | Status: AC
  Administered 2018-10-15: 10 mL
  Filled 2018-10-15: qty 10

## 2018-10-15 MED ORDER — DIPHENHYDRAMINE HCL 50 MG/ML IJ SOLN
INTRAMUSCULAR | Status: AC
  Filled 2018-10-15: qty 1

## 2018-10-15 MED ORDER — SODIUM CHLORIDE 0.9 % IV SOLN
Freq: Once | INTRAVENOUS | Status: AC
  Administered 2018-10-15: 10:00:00 via INTRAVENOUS
  Filled 2018-10-15: qty 5

## 2018-10-15 MED ORDER — PALONOSETRON HCL INJECTION 0.25 MG/5ML
INTRAVENOUS | Status: AC
  Filled 2018-10-15: qty 5

## 2018-10-15 MED ORDER — ALBUTEROL SULFATE HFA 108 (90 BASE) MCG/ACT IN AERS: 2.0000 | INHALATION_SPRAY | RESPIRATORY_TRACT | 1 refills | Status: AC | PRN

## 2018-10-15 MED ORDER — HEPARIN SOD (PORK) LOCK FLUSH 100 UNIT/ML IV SOLN
500.0000 [IU] | Freq: Once | INTRAVENOUS | Status: AC | PRN
  Administered 2018-10-15: 500 [IU]
  Filled 2018-10-15: qty 5

## 2018-10-15 MED ORDER — SODIUM CHLORIDE 0.9 % IV SOLN
Freq: Once | INTRAVENOUS | Status: AC
  Administered 2018-10-15: 10:00:00 via INTRAVENOUS
  Filled 2018-10-15: qty 250

## 2018-10-15 MED ORDER — PALONOSETRON HCL INJECTION 0.25 MG/5ML
0.2500 mg | Freq: Once | INTRAVENOUS | Status: AC
  Administered 2018-10-15: 0.25 mg via INTRAVENOUS

## 2018-10-15 MED ORDER — SODIUM CHLORIDE 0.9 % IV SOLN
273.2000 mg | Freq: Once | INTRAVENOUS | Status: AC
  Administered 2018-10-15: 270 mg via INTRAVENOUS
  Filled 2018-10-15: qty 27

## 2018-10-15 NOTE — Progress Notes (Signed)
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  She continues on neoadjuvant chemotherapy consisting of doxorubicin and cyclophosphamide in dose dense fashion x4 followed by weekly Abraxane and carboplatin x12. Today is day 1 cycle 2 of Abraxane and Carboplatin.     REVIEW OF SYSTEMS: Aiko is doing well today.  She had some achiness after receiving the first dose of this.  She said this feeling was overwhelming.  She notes that it has persisted, but is less frequent.  She denies any peripheral neuropathy.  She is drinking water, and says that sometimes she isn't drinking enough, but is also drinking other fluids such as gatorade and glucerna/boost.  She is measuring her liquids. She is fatigued.  She notes that she thinks this may be worse with the body aches.  She continues to walk daily.    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    Charlise is requesting that I refill her rescue inhaler for her asthma.  She only uses this once in a while, her last one was in 2018.   She notes her eye watering has decreased.  She has an occasional dry cough.  She denies any  fever, chills, nausea/vomiting, bowel/bladder changes, chest pain, palpitations.  Otherwise, a detailed ROS was non contributory today.  HISTORY OF CURRENT ILLNESS: From the original intake note:  SHASHA BUCHBINDER had routine screening mammography on 07/01/2018 showing a possible abnormality in the right breast; she notes that did feel some pain and a lump in her breast leading up to the routine screening. She underwent right breast ultrasonography at [facility] on 07/05/2018 showing: A 1.4 x 1.2 x 1.4 cm oval hypoechoic mass at the 5:30 position 8 cm from the nipple. A 0.9 x 1 x 1 cm slightly irregular hypoechoic mass at the 10:30 position 9 cm from the nipple. A 3 x 1.9 x 3 cm probable lymph node without fatty hilum in the LOWER RIGHT axilla. A RIGHT axillary lymph node with slightly thickened cortex.  Accordingly on 07/10/2018 she proceeded to biopsy of the right breast area in question. The pathology from this procedure showed ([id number]): invasive ductal carcinoma, high grade, 5:30 o'clock. Prognostic indicators significant for: estrogen receptor, 30% positive with weak staining intensity and progesterone receptor, 0% negative. Proliferation marker Ki67 at 70%.   An additional biopsy was performed on the same day ([id number]) showing:  2. Breast, right, needle core biopsy, 10 o'clock - Invasive carcinoma  Finally, an additional biopsy was performed at the right axilla on the same day ([id number]) showing: invasive carcinoma, right axilla. Prognostic indicators significant for: estrogen receptor, 70% positive, with weak staining intensity and progesterone receptor, 0% negative. Proliferation marker Ki67 at 70%. HER2 positive by immunohistochemistry.    The patient's subsequent  history is as detailed below.    PAST MEDICAL HISTORY: Past Medical History:  Diagnosis Date  . Allergy   . Arthritis   . Asthma   . Cancer [facility])    R breast cancer  . Diabetes mellitus without complication  ([place])    uncontrolled  . Family history of adverse reaction to anesthesia    "entire family takes a long time to wake up- N&V for several days"  . Family history of lung cancer   . Hypertension   . Migraine   . PONV (postoperative nausea and vomiting)      PAST SURGICAL HISTORY: Past Surgical History:  Procedure Laterality Date  . BREAST CYST EXCISION  1994  . DILATION AND CURETTAGE OF UTERUS    . PORTACATH PLACEMENT Right 08/01/2018   Procedure: INSERTION PORT-A-CATH WITH ULTRASOUND;  Surgeon: Alphonsa Overall, MD;  Location: [facility];  Service: General;  Laterality: Right;     FAMILY HISTORY: Family History  Problem Relation Age of Onset  . Hypertension Father   . Cancer Father 75       lung cancer  . Stroke Father   . Heart disease Mother        arrhythmia  . Hypertension Mother   . Kidney failure Sister    Sheryle's father died from lung cancer at age 94; he was a heavy smoker. Patients' mother is 87 as of 07/2018. The patient has 3 brothers and 2 sisters. Patient denies anyone in her family having breast, ovarian, prostate, or pancreatic cancer. Bethann's maternal great great uncle was diagnosed with lung cancer.     GYNECOLOGIC HISTORY:  Patient's last menstrual period was 11/14/2013. Menarche: 58 years old Age at first live birth: 59 years old Plain P: 4 LMP: 2015 Contraceptive: no HRT: no  Hysterectomy?: no BSO?: no   SOCIAL HISTORY: (Current as of 08/14/2018) Lacretia is a Forensic psychologist, but she is currently at home due to the recent pandemic. Her husband, [name], is a Administrator. At home with them is two toy poodles. Ayanna has 4 children, [name], Eppie Gibson, and [name]. [name] is 37, lives in [place], and works in Science writer. [name] is 66, lives in [place], and works in Engineer, technical sales. [name] is 55, is taking care of Nakhia, and works as a Control and instrumentation engineer. [name] is 57, lives in [place], and is a Buyer, retail.   ADVANCED DIRECTIVES:  In the absence of any documentation, Laresha's spouse, [name], is her healthcare power of attorney.     HEALTH MAINTENANCE: Social History   Tobacco Use  . Smoking status: Former Smoker    Packs/day: 1.00    Years: 5.00    Pack years: 5.00    Last attempt to quit: 05/08/1988    Years since quitting: 30.4  . Smokeless tobacco: Never Used  Substance Use Topics  . Alcohol use: No  . Drug use: No    Colonoscopy: yes, 2010 - due 2020.  PAP: 2017  Bone density: yes, 2015, normal Mammography: 07/01/2018   Allergies  Allergen Reactions  . Penicillins Hives    Did it involve swelling of the face/tongue/throat, SOB, or low BP? Yes--syncope episode Did it involve sudden or severe rash/hives, skin peeling, or any reaction on the inside of your mouth or nose? No Did you need to seek medical attention at a hospital or doctor's office? Yes When did it last happen? 37-38 YEARS AGO If all above answers are "NO", may proceed with cephalosporin use.   Marland Kitchen  Metformin And Related Diarrhea  . Adhesive [Tape] Other (See Comments)    "severe bruising"  ALSO SKIN GLUE  . Asa [Aspirin] Other (See Comments)    GI upset     Current Outpatient Medications  Medication Sig Dispense Refill  . albuterol (VENTOLIN HFA) 108 (90 Base) MCG/ACT inhaler Inhale 2 puffs into the lungs every 4 (four) hours as needed for wheezing or shortness of breath (cough, shortness of breath or wheezing.). 1 Inhaler 1  . atenolol (TENORMIN) 100 MG tablet TAKE 1 TABLET BY MOUTH EVERY DAY (Patient taking differently: Take 100 mg by mouth every evening. ) 30 tablet 5  . atorvastatin (LIPITOR) 20 MG tablet Take 1 tablet (20 mg total) by mouth daily. 90 tablet 3  . blood glucose meter kit and supplies KIT Dispense based on patient and insurance preference. Use up to four times daily as directed. (FOR ICD-9 250.00, 250.01). 1 each 0  . ciprofloxacin (CIPRO) 500 MG tablet Take 1 tablet (500 mg total) by mouth 2 (two) times daily.  60 tablet 0  . fluconazole (DIFLUCAN) 100 MG tablet Take 1 tablet (100 mg total) by mouth daily. 15 tablet 1  . gabapentin (NEURONTIN) 300 MG capsule Take 1 capsule (300 mg total) by mouth at bedtime. 90 capsule 4  . glipiZIDE (GLUCOTROL) 5 MG tablet Start with 5 mg once a day, then increase to twice a day after 1 week.  Continue to increase as directed by MD 120 tablet 3  . glucose blood (ACCU-CHEK ACTIVE STRIPS) test strip Use as instructed- check blood sugar up to 2x a day 100 each 12  . insulin aspart (NOVOLOG FLEXPEN) 100 UNIT/ML FlexPen If glucose 250-300 give 4 units. If glucose 301- 350 give 6 units, if glucose 351- 400 give 8 units.  If higher than 400 give 10 units.   May repeat in one hour if glucose still over 400. 15 mL 3  . Insulin Glargine (BASAGLAR KWIKPEN) 100 UNIT/ML SOPN INJECT 14 UNITS SUBCUTANEOUSLY DAILY (Patient taking differently: Inject 16 Units into the skin daily. ) 15 mL 3  . Insulin Pen Needle (PEN NEEDLES) 32G X 4 MM MISC 1 each by Does not apply route daily. 100 each 3  . Insulin Pen Needle (PEN NEEDLES) 32G X 4 MM MISC 1 each by Does not apply route daily. 100 each 3  . lidocaine-prilocaine (EMLA) cream Apply to affected area once 30 g 3  . lisinopril-hydrochlorothiazide (ZESTORETIC) 20-12.5 MG tablet Take 2 tablets by mouth daily. (Patient taking differently: Take 2 tablets by mouth every evening. ) 180 tablet 3  . loratadine (CLARITIN) 10 MG tablet Take 1 tablet (10 mg total) by mouth daily.    Marland Kitchen LORazepam (ATIVAN) 0.5 MG tablet Take 1 tablet (0.5 mg total) by mouth at bedtime as needed (Nausea or vomiting). 30 tablet 0  . magic mouthwash SOLN Take 5 mLs by mouth 4 (four) times daily as needed for mouth pain. 180 mL 3  . omeprazole (PRILOSEC) 40 MG capsule Take 1 capsule (40 mg total) by mouth daily. 60 capsule 1  . ondansetron (ZOFRAN) 8 MG tablet Take 1 tablet (8 mg total) by mouth every 8 (eight) hours as needed for nausea or vomiting. 30 tablet 2  . ondansetron  (ZOFRAN) 8 MG tablet Take 1 tablet (8 mg total) by mouth every 8 (eight) hours as needed for nausea or vomiting. 20 tablet 0  . ONE TOUCH ULTRA TEST test strip Check sugar 2 times daily 100 each  prn  . ONETOUCH DELICA LANCETS 33G MISC Check sugar 2 times daily 100 each prn  . potassium chloride SA (K-DUR) 20 MEQ tablet Take 1 tablet (20 mEq total) by mouth daily. 90 tablet 4  . prochlorperazine (COMPAZINE) 10 MG tablet Take 1 tablet (10 mg total) by mouth every 6 (six) hours as needed (Nausea or vomiting). 30 tablet 1  . valACYclovir (VALTREX) 500 MG tablet Take 1 tablet (500 mg total) by mouth daily. 10 tablet 1  . Vitamin D, Ergocalciferol, (DRISDOL) 1.25 MG (50000 UT) CAPS capsule TAKE 1 CAPSULE BY MOUTH EVERY 7 DAYS (Patient taking differently: Take 50,000 Units by mouth every Saturday. ) 12 capsule 1   No current facility-administered medications for this visit.    Facility-Administered Medications Ordered in Other Visits  Medication Dose Route Frequency Provider Last Rate Last Dose  . sodium chloride flush (NS) 0.9 % injection 10 mL  10 mL Intracatheter PRN Tuere Nwosu Cornetto, NP   10 mL at 10/15/18 1323     OBJECTIVE:  Vitals:   10/15/18 0859  BP: (!) 147/92  Pulse: 88  Resp: 20  Temp: 98.9 F (37.2 C)  SpO2: 100%     Body mass index is 30.42 kg/m.   Wt Readings from Last 3 Encounters:  10/15/18 188 lb 8 oz (85.5 kg)  10/03/18 186 lb 4.8 oz (84.5 kg)  09/16/18 188 lb 3.2 oz (85.4 kg)      ECOG FS:1 - Symptomatic but completely ambulatory GENERAL: Patient is a well appearing female in no acute distress HEENT:  Sclerae anicteric.  Oropharynx clear and moist. No ulcerations or evidence of oropharyngeal candidiasis. Neck is supple.  NODES:  No cervical, supraclavicular, or axillary lymphadenopathy palpated.  BREAST EXAM:  Deferred. LUNGS:  Clear to auscultation bilaterally.  No wheezes or rhonchi. HEART:  Regular rate and rhythm. No murmur appreciated. ABDOMEN:   Soft, nontender.  Positive, normoactive bowel sounds. No organomegaly palpated. MSK:  No focal spinal tenderness to palpation. Full range of motion bilaterally in the upper extremities. EXTREMITIES:  No peripheral edema.   SKIN:  Clear with no obvious rashes or skin changes. No nail dyscrasia. NEURO:  Nonfocal. Well oriented.  Appropriate affect.     LAB RESULTS:  CMP     Component Value Date/Time   NA 140 10/15/2018 0818   K 3.6 10/15/2018 0818   CL 102 10/15/2018 0818   CO2 26 10/15/2018 0818   GLUCOSE 134 (H) 10/15/2018 0818   BUN 10 10/15/2018 0818   CREATININE 0.78 10/15/2018 0818   CREATININE 0.72 10/08/2018 1000   CREATININE 0.70 04/14/2015 1558   CALCIUM 10.1 10/15/2018 0818   PROT 7.3 10/15/2018 0818   ALBUMIN 3.8 10/15/2018 0818   AST 27 10/15/2018 0818   AST 17 10/08/2018 1000   ALT 25 10/15/2018 0818   ALT 23 10/08/2018 1000   ALKPHOS 54 10/15/2018 0818   BILITOT 0.3 10/15/2018 0818   BILITOT <0.2 (L) 10/08/2018 1000   GFRNONAA >60 10/15/2018 0818   GFRNONAA >60 10/08/2018 1000   GFRNONAA 87 03/27/2014 1130   GFRAA >60 10/15/2018 0818   GFRAA >60 10/08/2018 1000   GFRAA >89 03/27/2014 1130    No results found for: TOTALPROTELP, ALBUMINELP, A1GS, A2GS, BETS, BETA2SER, GAMS, MSPIKE, SPEI  No results found for: KPAFRELGTCHN, LAMBDASER, KAPLAMBRATIO  Lab Results  Component Value Date   WBC 3.9 (L) 10/15/2018   NEUTROABS 3.0 10/15/2018   HGB 9.3 (L) 10/15/2018   HCT 28.3 (L) 10/15/2018     MCV 93.4 10/15/2018   PLT 308 10/15/2018    _0 @  No results found for: LABCA2  No components found for: [id number]  No results for input(s): INR in the last 168 hours.  No results found for: LABCA2  No results found for: TKZ601  No results found for: UXN235  No results found for: TDD220  No results found for: CA2729  No components found for: HGQUANT  No results found for: CEA1 / No results found for: CEA1   No results found for: AFPTUMOR   No results found for: CHROMOGRNA  No results found for: PSA1  Appointment on 10/15/2018  Component Date Value Ref Range Status  . Sodium 10/15/2018 140  135 - 145 mmol/L Final  . Potassium 10/15/2018 3.6  3.5 - 5.1 mmol/L Final  . Chloride 10/15/2018 102  98 - 111 mmol/L Final  . CO2 10/15/2018 26  22 - 32 mmol/L Final  . Glucose, Bld 10/15/2018 134* 70 - 99 mg/dL Final  . BUN 10/15/2018 10  6 - 20 mg/dL Final  . Creatinine, Ser 10/15/2018 0.78  0.44 - 1.00 mg/dL Final  . Calcium 10/15/2018 10.1  8.9 - 10.3 mg/dL Final  . Total Protein 10/15/2018 7.3  6.5 - 8.1 g/dL Final  . Albumin 10/15/2018 3.8  3.5 - 5.0 g/dL Final  . AST 10/15/2018 27  15 - 41 U/L Final  . ALT 10/15/2018 25  0 - 44 U/L Final  . Alkaline Phosphatase 10/15/2018 54  38 - 126 U/L Final  . Total Bilirubin 10/15/2018 0.3  0.3 - 1.2 mg/dL Final  . GFR calc non Af Amer 10/15/2018 >60  >60 mL/min Final  . GFR calc Af Amer 10/15/2018 >60  >60 mL/min Final  . Anion gap 10/15/2018 12  5 - 15 Final   Performed at [facility], [place] [address] [place], [place] [address]  . WBC 10/15/2018 3.9* 4.0 - 10.5 K/uL Final  . RBC 10/15/2018 3.03* 3.87 - 5.11 MIL/uL Final  . Hemoglobin 10/15/2018 9.3* 12.0 - 15.0 g/dL Final  . HCT 10/15/2018 28.3* 36.0 - 46.0 % Final  . MCV 10/15/2018 93.4  80.0 - 100.0 fL Final  . MCH 10/15/2018 30.7  26.0 - 34.0 pg Final  . MCHC 10/15/2018 32.9  30.0 - 36.0 g/dL Final  . RDW 10/15/2018 18.0* 11.5 - 15.5 % Final  . Platelets 10/15/2018 308  150 - 400 K/uL Final  . nRBC 10/15/2018 0.0  0.0 - 0.2 % Final  . Neutrophils Relative % 10/15/2018 76  % Final  . Neutro Abs 10/15/2018 3.0  1.7 - 7.7 K/uL Final  . Lymphocytes Relative 10/15/2018 12  % Final  . Lymphs Abs 10/15/2018 0.5* 0.7 - 4.0 K/uL Final  . Monocytes Relative 10/15/2018 8  % Final  . Monocytes Absolute 10/15/2018 0.3  0.1 - 1.0 K/uL Final  . Eosinophils Relative 10/15/2018 2  % Final  . Eosinophils Absolute  10/15/2018 0.1  0.0 - 0.5 K/uL Final  . Basophils Relative 10/15/2018 1  % Final  . Basophils Absolute 10/15/2018 0.0  0.0 - 0.1 K/uL Final  . Immature Granulocytes 10/15/2018 1  % Final  . Abs Immature Granulocytes 10/15/2018 0.03  0.00 - 0.07 K/uL Final   Performed at [facility], [place] [address], [place], [place] [address]    (this displays the last labs from the last 3 days)  No results found for: TOTALPROTELP, ALBUMINELP, A1GS, A2GS, BETS, BETA2SER, GAMS, MSPIKE, SPEI (  this displays SPEP labs)  No results found for: KPAFRELGTCHN, LAMBDASER, KAPLAMBRATIO (kappa/lambda light chains)  No results found for: HGBA, HGBA2QUANT, HGBFQUANT, HGBSQUAN (Hemoglobinopathy evaluation)   No results found for: LDH  No results found for: IRON, TIBC, IRONPCTSAT (Iron and TIBC)  No results found for: FERRITIN  Urinalysis    Component Value Date/Time   COLORURINE YELLOW 03/07/2016 1350   APPEARANCEUR CLEAR 03/07/2016 1350   LABSPEC 1.031 (H) 03/07/2016 1350   PHURINE 5.0 03/07/2016 1350   GLUCOSEU >1000 (A) 03/07/2016 1350   HGBUR NEGATIVE 03/07/2016 1350   BILIRUBINUR NEGATIVE 03/07/2016 1350   BILIRUBINUR neg 11/24/2013 1547   KETONESUR 15 (A) 03/07/2016 1350   PROTEINUR NEGATIVE 03/07/2016 1350   UROBILINOGEN 0.2 11/24/2013 1547   UROBILINOGEN 0.2 11/10/2006 1153   NITRITE NEGATIVE 03/07/2016 1350   LEUKOCYTESUR NEGATIVE 03/07/2016 1350     STUDIES:  Us Breast Ltd Uni Right Inc Axilla  Result Date: 09/23/2018 CLINICAL DATA:  Follow-up ultrasound to evaluate response to neoadjuvant chemotherapy of 2 sites of known malignancy in the right breast. EXAM: ULTRASOUND OF THE RIGHT BREAST COMPARISON:  Previous exam(s). FINDINGS: The right breast mass at 530, 8 cm from the nipple measures 1.1 x 0.6 x 0.8 cm, previously measuring 1.4 x 1.2 x 1.4 cm. The right breast mass at 10:30, 9 cm from the nipple measures 0.6 x 0.3 x 0.5 cm, previously measuring 1.0 x 0.9 x 1.0  cm. The lymph node in the lower right axilla today measures 1.4 x 0.7 x 1.1 cm, previously measuring 3.0 x 1.9 x 3.0 cm. IMPRESSION: Interval decrease in size of the 2 sites of malignancy in the right breast, as well as significant decrease in size of the metastatic right axillary lymph node. RECOMMENDATION: Continue treatment plan. I have discussed the findings and recommendations with the patient. Results were also provided in writing at the conclusion of the visit. If applicable, a reminder letter will be sent to the patient regarding the next appointment. BI-RADS CATEGORY  6: Known biopsy-proven malignancy. Electronically Signed   By: Michelle  Collins M.D.   On: 09/23/2018 12:04     ELIGIBLE FOR AVAILABLE RESEARCH PROTOCOL: No   ASSESSMENT: 58 y.o. [place], [place] woman status post right breast biopsy x2 for multicentric invasive ductal carcinoma, clinically T1c N1, stage IIB, grade 3, functionally triple negative, with an MIB-1 of 70%  (a) right axillary lymph node biopsied at the same time was positive  (1) neoadjuvant chemotherapy will consist of doxorubicin and cyclophosphamide in dose dense fashion x4 followed by weekly Abraxane and carboplatin x12 starting 08/07/2018  (2) definitive surgery to follow  (3) adjuvant radiation as appropriate  (4) genetics testing 08/06/2018 through the Breast + GYN Cancers Panel offered by [facility] found no deleterious mutations in ATM, BARD1, BRCA1, BRCA2, BRIP1, CDH1, CHEK2, DICER1, EPCAM, MLH1, MSH2, MSH6, NBN, NF1, PALB2, PMS2, PTEN, RAD50, RAD51C, RAD51D, SMARCA4, STK11, TP53.     PLAN: Jenevieve is doing well today.  She tolerated her first cycle of Abraxane and Carboplatin relatively well and will proceed with her second cycle today.  Her lab work is stable (CMET pending at time of appointment completion).  The achiness, is likely first dose effect and will hopefully improve after today.    We of course will monitor her closely for signs of  peripheral neuropathy.  She has not developed this at this point.    Aneira and I reviewed her asthma which is under good control.  I refilled her rescue inhaler   today.  It was out of date and needed a refill.    Giani and I discussed her fatigue.  I recommended she continue to walk as she has been.    We will see Alekhya with every other cycle for the next 6 treatments, after which we will see her with every cycle.  I let her know that if she does develop any concerns on the week she is getting treatment alone, to please let us know and [name] or myself will go and se eher.  Loreal knows to call for any other issues that may develop before the next visit.  A total of (30) minutes of face-to-face time was spent with this patient with greater than 50% of that time in counseling and care-coordination.   Wilber Bihari, NP  10/15/18 3:09 PM Medical Oncology and Hematology [facility] [address] [place], [place] [address] Tel. [phone]    Fax. [phone]

## 2018-10-15 NOTE — Patient Instructions (Signed)
[facility] Discharge Instructions for Patients Receiving Chemotherapy  Today you received the following chemotherapy agents Abraxane and Carboplatin  To help prevent nausea and vomiting after your treatment, we encourage you to take your nausea medication as directed.   If you develop nausea and vomiting that is not controlled by your nausea medication, call the clinic.   BELOW ARE SYMPTOMS THAT SHOULD BE REPORTED IMMEDIATELY:  *FEVER GREATER THAN 100.5 F  *CHILLS WITH OR WITHOUT FEVER  NAUSEA AND VOMITING THAT IS NOT CONTROLLED WITH YOUR NAUSEA MEDICATION  *UNUSUAL SHORTNESS OF BREATH  *UNUSUAL BRUISING OR BLEEDING  TENDERNESS IN MOUTH AND THROAT WITH OR WITHOUT PRESENCE OF ULCERS  *URINARY PROBLEMS  *BOWEL PROBLEMS  UNUSUAL RASH Items with * indicate a potential emergency and should be followed up as soon as possible.  Feel free to call the clinic should you have any questions or concerns. The clinic phone number is [phone].  Please show the CHEMO ALERT CARD at check-in to the Emergency Department and triage nurse.  Nanoparticle Albumin-Bound Paclitaxel injection What is this medicine? NANOPARTICLE ALBUMIN-BOUND PACLITAXEL (Na no PAHR ti kuhl al BYOO muhn-bound PAK li TAX el) is a chemotherapy drug. It targets fast dividing cells, like cancer cells, and causes these cells to die. This medicine is used to treat advanced breast cancer, lung cancer, and pancreatic cancer. This medicine may be used for other purposes; ask your health care provider or pharmacist if you have questions. COMMON BRAND NAME(S): Abraxane What should I tell my health care provider before I take this medicine? They need to know if you have any of these conditions: -kidney disease -liver disease -low blood counts, like low white cell, platelet, or red cell counts -lung or breathing disease, like asthma -tingling of the fingers or toes, or other nerve disorder -an unusual or  allergic reaction to paclitaxel, albumin, other chemotherapy, other medicines, foods, dyes, or preservatives -pregnant or trying to get pregnant -breast-feeding How should I use this medicine? This drug is given as an infusion into a vein. It is administered in a hospital or clinic by a specially trained health care professional. Talk to your pediatrician regarding the use of this medicine in children. Special care may be needed. Overdosage: If you think you have taken too much of this medicine contact a poison control center or emergency room at once. NOTE: This medicine is only for you. Do not share this medicine with others. What if I miss a dose? It is important not to miss your dose. Call your doctor or health care professional if you are unable to keep an appointment. What may interact with this medicine? This medicine may interact with the following medications: -antiviral medicines for hepatitis, HIV or AIDS -certain antibiotics like erythromycin and clarithromycin -certain medicines for fungal infections like ketoconazole and itraconazole -certain medicines for seizures like carbamazepine, phenobarbital, phenytoin -gemfibrozil -nefazodone -rifampin -St. John's wort This list may not describe all possible interactions. Give your health care provider a list of all the medicines, herbs, non-prescription drugs, or dietary supplements you use. Also tell them if you smoke, drink alcohol, or use illegal drugs. Some items may interact with your medicine. What should I watch for while using this medicine? Your condition will be monitored carefully while you are receiving this medicine. You will need important blood work done while you are taking this medicine. This medicine can cause serious allergic reactions. If you experience allergic reactions like skin rash, itching or hives, swelling of   the face, lips, or tongue, tell your doctor or health care professional right away. In some cases,  you may be given additional medicines to help with side effects. Follow all directions for their use. This drug may make you feel generally unwell. This is not uncommon, as chemotherapy can affect healthy cells as well as cancer cells. Report any side effects. Continue your course of treatment even though you feel ill unless your doctor tells you to stop. Call your doctor or health care professional for advice if you get a fever, chills or sore throat, or other symptoms of a cold or flu. Do not treat yourself. This drug decreases your body's ability to fight infections. Try to avoid being around people who are sick. This medicine may increase your risk to bruise or bleed. Call your doctor or health care professional if you notice any unusual bleeding. Be careful brushing and flossing your teeth or using a toothpick because you may get an infection or bleed more easily. If you have any dental work done, tell your dentist you are receiving this medicine. Avoid taking products that contain aspirin, acetaminophen, ibuprofen, naproxen, or ketoprofen unless instructed by your doctor. These medicines may hide a fever. Do not become pregnant while taking this medicine or for 6 months after stopping it. Women should inform their doctor if they wish to become pregnant or think they might be pregnant. Men should not father a child while taking this medicine or for 3 months after stopping it. There is a potential for serious side effects to an unborn child. Talk to your health care professional or pharmacist for more information. Do not breast-feed an infant while taking this medicine or for 2 weeks after stopping it. This medicine may interfere with the ability to get pregnant or to father a child. You should talk to your doctor or health care professional if you are concerned about your fertility. What side effects may I notice from receiving this medicine? Side effects that you should report to your doctor or  health care professional as soon as possible: -allergic reactions like skin rash, itching or hives, swelling of the face, lips, or tongue -breathing problems -changes in vision -fast, irregular heartbeat -low blood pressure -mouth sores -pain, tingling, numbness in the hands or feet -signs of decreased platelets or bleeding - bruising, pinpoint red spots on the skin, black, tarry stools, blood in the urine -signs of decreased red blood cells - unusually weak or tired, feeling faint or lightheaded, falls -signs of infection - fever or chills, cough, sore throat, pain or difficulty passing urine -signs and symptoms of liver injury like dark yellow or brown urine; general ill feeling or flu-like symptoms; light-colored stools; loss of appetite; nausea; right upper belly pain; unusually weak or tired; yellowing of the eyes or skin -swelling of the ankles, feet, hands -unusually slow heartbeat Side effects that usually do not require medical attention (report to your doctor or health care professional if they continue or are bothersome): -diarrhea -hair loss -loss of appetite -nausea, vomiting -tiredness This list may not describe all possible side effects. Call your doctor for medical advice about side effects. You may report side effects to FDA at 1-800-FDA-1088. Where should I keep my medicine? This drug is given in a hospital or clinic and will not be stored at home. NOTE: This sheet is a summary. It may not cover all possible information. If you have questions about this medicine, talk to your doctor, pharmacist, or health care   provider.  2019 Elsevier/Gold Standard (2016-12-26 13:03:45)  Carboplatin injection What is this medicine? CARBOPLATIN (KAR boe pla tin) is a chemotherapy drug. It targets fast dividing cells, like cancer cells, and causes these cells to die. This medicine is used to treat ovarian cancer and many other cancers. This medicine may be used for other purposes; ask  your health care provider or pharmacist if you have questions. COMMON BRAND NAME(S): Paraplatin What should I tell my health care provider before I take this medicine? They need to know if you have any of these conditions: -blood disorders -hearing problems -kidney disease -recent or ongoing radiation therapy -an unusual or allergic reaction to carboplatin, cisplatin, other chemotherapy, other medicines, foods, dyes, or preservatives -pregnant or trying to get pregnant -breast-feeding How should I use this medicine? This drug is usually given as an infusion into a vein. It is administered in a hospital or clinic by a specially trained health care professional. Talk to your pediatrician regarding the use of this medicine in children. Special care may be needed. Overdosage: If you think you have taken too much of this medicine contact a poison control center or emergency room at once. NOTE: This medicine is only for you. Do not share this medicine with others. What if I miss a dose? It is important not to miss a dose. Call your doctor or health care professional if you are unable to keep an appointment. What may interact with this medicine? -medicines for seizures -medicines to increase blood counts like filgrastim, pegfilgrastim, sargramostim -some antibiotics like amikacin, gentamicin, neomycin, streptomycin, tobramycin -vaccines Talk to your doctor or health care professional before taking any of these medicines: -acetaminophen -aspirin -ibuprofen -ketoprofen -naproxen This list may not describe all possible interactions. Give your health care provider a list of all the medicines, herbs, non-prescription drugs, or dietary supplements you use. Also tell them if you smoke, drink alcohol, or use illegal drugs. Some items may interact with your medicine. What should I watch for while using this medicine? Your condition will be monitored carefully while you are receiving this medicine. You  will need important blood work done while you are taking this medicine. This drug may make you feel generally unwell. This is not uncommon, as chemotherapy can affect healthy cells as well as cancer cells. Report any side effects. Continue your course of treatment even though you feel ill unless your doctor tells you to stop. In some cases, you may be given additional medicines to help with side effects. Follow all directions for their use. Call your doctor or health care professional for advice if you get a fever, chills or sore throat, or other symptoms of a cold or flu. Do not treat yourself. This drug decreases your body's ability to fight infections. Try to avoid being around people who are sick. This medicine may increase your risk to bruise or bleed. Call your doctor or health care professional if you notice any unusual bleeding. Be careful brushing and flossing your teeth or using a toothpick because you may get an infection or bleed more easily. If you have any dental work done, tell your dentist you are receiving this medicine. Avoid taking products that contain aspirin, acetaminophen, ibuprofen, naproxen, or ketoprofen unless instructed by your doctor. These medicines may hide a fever. Do not become pregnant while taking this medicine. Women should inform their doctor if they wish to become pregnant or think they might be pregnant. There is a potential for serious side effects to an   unborn child. Talk to your health care professional or pharmacist for more information. Do not breast-feed an infant while taking this medicine. What side effects may I notice from receiving this medicine? Side effects that you should report to your doctor or health care professional as soon as possible: -allergic reactions like skin rash, itching or hives, swelling of the face, lips, or tongue -signs of infection - fever or chills, cough, sore throat, pain or difficulty passing urine -signs of decreased platelets  or bleeding - bruising, pinpoint red spots on the skin, black, tarry stools, nosebleeds -signs of decreased red blood cells - unusually weak or tired, fainting spells, lightheadedness -breathing problems -changes in hearing -changes in vision -chest pain -high blood pressure -low blood counts - This drug may decrease the number of white blood cells, red blood cells and platelets. You may be at increased risk for infections and bleeding. -nausea and vomiting -pain, swelling, redness or irritation at the injection site -pain, tingling, numbness in the hands or feet -problems with balance, talking, walking -trouble passing urine or change in the amount of urine Side effects that usually do not require medical attention (report to your doctor or health care professional if they continue or are bothersome): -hair loss -loss of appetite -metallic taste in the mouth or changes in taste This list may not describe all possible side effects. Call your doctor for medical advice about side effects. You may report side effects to FDA at 1-800-FDA-1088. Where should I keep my medicine? This drug is given in a hospital or clinic and will not be stored at home. NOTE: This sheet is a summary. It may not cover all possible information. If you have questions about this medicine, talk to your doctor, pharmacist, or health care provider.  2019 Elsevier/Gold Standard (2007-07-30 14:38:05)     

## 2018-10-16 ENCOUNTER — Telehealth: Payer: Self-pay | Admitting: *Deleted

## 2018-10-16 NOTE — Telephone Encounter (Signed)
Spoke to pt regarding needs during chemo. Relate doing well. Denies questions or needs at this time. Pt relate she is tolerating her new chemo regimen (Abraxane/Carboplatin) well. No complaints other than "a little tired". Encourage pt to call with needs or questions. Received verbal understanding.

## 2018-10-18 ENCOUNTER — Encounter: Payer: Self-pay | Admitting: Oncology

## 2018-10-22 ENCOUNTER — Other Ambulatory Visit: Payer: Self-pay

## 2018-10-22 ENCOUNTER — Inpatient Hospital Stay: Payer: Managed Care, Other (non HMO)

## 2018-10-22 ENCOUNTER — Ambulatory Visit: Payer: Managed Care, Other (non HMO) | Admitting: Oncology

## 2018-10-22 ENCOUNTER — Ambulatory Visit (HOSPITAL_BASED_OUTPATIENT_CLINIC_OR_DEPARTMENT_OTHER): Payer: Managed Care, Other (non HMO) | Admitting: Oncology

## 2018-10-22 VITALS — BP 139/95 | HR 81 | Temp 98.2°F | Resp 18 | Wt 189.8 lb

## 2018-10-22 DIAGNOSIS — C50811 Malignant neoplasm of overlapping sites of right female breast: Secondary | ICD-10-CM

## 2018-10-22 DIAGNOSIS — Z95828 Presence of other vascular implants and grafts: Secondary | ICD-10-CM

## 2018-10-22 DIAGNOSIS — E119 Type 2 diabetes mellitus without complications: Secondary | ICD-10-CM

## 2018-10-22 DIAGNOSIS — Z87891 Personal history of nicotine dependence: Secondary | ICD-10-CM

## 2018-10-22 DIAGNOSIS — Z79899 Other long term (current) drug therapy: Secondary | ICD-10-CM

## 2018-10-22 DIAGNOSIS — Z171 Estrogen receptor negative status [ER-]: Secondary | ICD-10-CM

## 2018-10-22 DIAGNOSIS — Z170 Estrogen receptor positive status [ER+]: Secondary | ICD-10-CM

## 2018-10-22 DIAGNOSIS — I10 Essential (primary) hypertension: Secondary | ICD-10-CM

## 2018-10-22 DIAGNOSIS — G629 Polyneuropathy, unspecified: Secondary | ICD-10-CM

## 2018-10-22 DIAGNOSIS — Z801 Family history of malignant neoplasm of trachea, bronchus and lung: Secondary | ICD-10-CM

## 2018-10-22 DIAGNOSIS — R5383 Other fatigue: Secondary | ICD-10-CM

## 2018-10-22 DIAGNOSIS — J45909 Unspecified asthma, uncomplicated: Secondary | ICD-10-CM

## 2018-10-22 DIAGNOSIS — Z794 Long term (current) use of insulin: Secondary | ICD-10-CM

## 2018-10-22 DIAGNOSIS — IMO0001 Reserved for inherently not codable concepts without codable children: Secondary | ICD-10-CM

## 2018-10-22 LAB — COMPREHENSIVE METABOLIC PANEL
ALT: 25 U/L (ref 0–44)
AST: 19 U/L (ref 15–41)
Albumin: 3.9 g/dL (ref 3.5–5.0)
Alkaline Phosphatase: 57 U/L (ref 38–126)
Anion gap: 13 (ref 5–15)
BUN: 12 mg/dL (ref 6–20)
CO2: 26 mmol/L (ref 22–32)
Calcium: 9.9 mg/dL (ref 8.9–10.3)
Chloride: 101 mmol/L (ref 98–111)
Creatinine, Ser: 0.77 mg/dL (ref 0.44–1.00)
GFR calc Af Amer: >60 mL/min (ref >60)
GFR calc non Af Amer: >60 mL/min (ref >60)
Glucose, Bld: 122 mg/dL — ABNORMAL HIGH (ref 70–99)
Potassium: 3 mmol/L — CL (ref 3.5–5.1)
Sodium: 140 mmol/L (ref 135–145)
Total Bilirubin: <0.2 mg/dL — ABNORMAL LOW (ref 0.3–1.2)
Total Protein: 7.4 g/dL (ref 6.5–8.1)

## 2018-10-22 LAB — CBC WITH DIFFERENTIAL/PLATELET
Abs Immature Granulocytes: 0.02 10*3/uL (ref 0.00–0.07)
Basophils Absolute: 0 10*3/uL (ref 0.0–0.1)
Basophils Relative: 1 %
Eosinophils Absolute: 0.1 10*3/uL (ref 0.0–0.5)
Eosinophils Relative: 3 %
HCT: 27.8 % — ABNORMAL LOW (ref 36.0–46.0)
Hemoglobin: 9.2 g/dL — ABNORMAL LOW (ref 12.0–15.0)
Immature Granulocytes: 1 %
Lymphocytes Relative: 16 %
Lymphs Abs: 0.5 10*3/uL — ABNORMAL LOW (ref 0.7–4.0)
MCH: 31 pg (ref 26.0–34.0)
MCHC: 33.1 g/dL (ref 30.0–36.0)
MCV: 93.6 fL (ref 80.0–100.0)
Monocytes Absolute: 0.2 10*3/uL (ref 0.1–1.0)
Monocytes Relative: 6 %
Neutro Abs: 2.3 10*3/uL (ref 1.7–7.7)
Neutrophils Relative %: 73 %
Platelets: 242 10*3/uL (ref 150–400)
RBC: 2.97 MIL/uL — ABNORMAL LOW (ref 3.87–5.11)
RDW: 17.4 % — ABNORMAL HIGH (ref 11.5–15.5)
WBC: 3.1 10*3/uL — ABNORMAL LOW (ref 4.0–10.5)
nRBC: 0 % (ref 0.0–0.2)

## 2018-10-22 MED ORDER — HEPARIN SOD (PORK) LOCK FLUSH 100 UNIT/ML IV SOLN
500.0000 [IU] | Freq: Once | INTRAVENOUS | Status: AC
  Administered 2018-10-22: 500 [IU]
  Filled 2018-10-22: qty 5

## 2018-10-22 MED ORDER — SODIUM CHLORIDE 0.9% FLUSH
10.0000 mL | Freq: Once | INTRAVENOUS | Status: AC
  Administered 2018-10-22: 10 mL
  Filled 2018-10-22: qty 10

## 2018-10-22 NOTE — Patient Instructions (Signed)
Coronavirus (COVID-19) Are you at risk?  Are you at risk for the Coronavirus (COVID-19)?  To be considered HIGH RISK for Coronavirus (COVID-19), you have to meet the following criteria:  . Traveled to [place], [place], [place], [place] or [place]; or in the [place] to [place], [place], [place], or [place]; and have fever, cough, and shortness of breath within the last 2 weeks of travel OR . Been in close contact with a person diagnosed with COVID-19 within the last 2 weeks and have fever, cough, and shortness of breath . IF YOU DO NOT MEET THESE CRITERIA, YOU ARE CONSIDERED LOW RISK FOR COVID-19.  What to do if you are HIGH RISK for COVID-19?  . If you are having a medical emergency, call 911. . Seek medical care right away. Before you go to a doctor's office, urgent care or emergency department, call ahead and tell them about your recent travel, contact with someone diagnosed with COVID-19, and your symptoms. You should receive instructions from your physician's office regarding next steps of care.  . When you arrive at healthcare provider, tell the healthcare staff immediately you have returned from visiting [place], [place], [place], [place] or [place]; or traveled in the [place] to [place], [place], [place], or [place]; in the last two weeks or you have been in close contact with a person diagnosed with COVID-19 in the last 2 weeks.   . Tell the health care staff about your symptoms: fever, cough and shortness of breath. . After you have been seen by a medical provider, you will be either: o Tested for (COVID-19) and discharged home on quarantine except to seek medical care if symptoms worsen, and asked to  - Stay home and avoid contact with others until you get your results (4-5 days)  - Avoid travel on public transportation if possible (such as bus, train, or airplane) or o Sent to the Emergency Department by EMS for evaluation, COVID-19 testing, and possible  admission depending on your condition and test results.  What to do if you are LOW RISK for COVID-19?  Reduce your risk of any infection by using the same precautions used for avoiding the common cold or flu:  . Wash your hands often with soap and warm water for at least 20 seconds.  If soap and water are not readily available, use an alcohol-based hand sanitizer with at least 60% alcohol.  . If coughing or sneezing, cover your mouth and nose by coughing or sneezing into the elbow areas of your shirt or coat, into a tissue or into your sleeve (not your hands). . Avoid shaking hands with others and consider head nods or verbal greetings only. . Avoid touching your eyes, nose, or mouth with unwashed hands.  . Avoid close contact with people who are sick. . Avoid places or events with large numbers of people in one location, like concerts or sporting events. . Carefully consider travel plans you have or are making. . If you are planning any travel outside or inside the [place], visit the CDC's Travelers' Health webpage for the latest health notices. . If you have some symptoms but not all symptoms, continue to monitor at home and seek medical attention if your symptoms worsen. . If you are having a medical emergency, call 911.   ADDITIONAL HEALTHCARE OPTIONS FOR PATIENTS  [facility] / e-Visit: [email or web address]         [facility]: [phone]  [place]   [facility]: [phone]                   [facility]: [phone]   

## 2018-10-22 NOTE — Progress Notes (Signed)
Per Dr Jana Hakim no tx today.

## 2018-10-22 NOTE — Progress Notes (Signed)
Pt C/O new onset of numbness in toes on R foot, started in the last 3-4 days.    Dr. Gwenlyn Perking informed, is coming to see pt in infusion.

## 2018-10-22 NOTE — Progress Notes (Signed)
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  She is currently receiving weekly Abraxane and carboplatinum.  Today is day 1 cycle 7  REVIEW OF SYSTEMS: Autumn Cook tells me she has developed numbness of the big toe and the next 2 toes in the right foot.  This has been present for several days.  It does not keep her from walking and she has a good sense of balance.  She does not have any neuropathy in her fingers or the other foot.  A detailed review of systems today was otherwise noncontributory  HISTORY OF CURRENT ILLNESS: From the original intake note:  Autumn Cook had routine screening mammography on 07/01/2018 showing a possible abnormality in the right breast; she notes that did feel some pain and a lump in her breast leading up to the routine screening. She underwent right breast ultrasonography at [facility] on 07/05/2018 showing: A 1.4 x 1.2 x 1.4 cm oval hypoechoic mass at the 5:30 position 8 cm from the nipple. A 0.9 x 1 x 1 cm slightly irregular hypoechoic mass at the 10:30 position 9 cm from the nipple. A 3 x 1.9 x 3 cm probable lymph node without fatty hilum in the LOWER RIGHT axilla. A RIGHT axillary lymph node with  slightly thickened cortex.  Accordingly on 07/10/2018 she proceeded to biopsy of the right breast area in question. The pathology from this procedure showed ([id number]): invasive ductal carcinoma, high grade, 5:30 o'clock. Prognostic indicators significant for: estrogen receptor, 30% positive with weak staining intensity and progesterone receptor, 0% negative. Proliferation marker Ki67 at 70%.   An additional biopsy was performed on the same day ([id number]) showing:  2. Breast, right, needle core biopsy, 10 o'clock - Invasive carcinoma  Finally, an additional biopsy was performed at the right axilla on the same day ([id number]) showing: invasive carcinoma, right axilla. Prognostic indicators significant for: estrogen receptor, 70% positive, with weak staining intensity and progesterone receptor, 0% negative. Proliferation marker Ki67 at 70%. HER2 positive by immunohistochemistry.    The patient's subsequent history is as detailed below.    PAST MEDICAL HISTORY: Past Medical History:  Diagnosis Date  . Allergy   . Arthritis   . Asthma   . Cancer [facility] [place])    R breast cancer  . Diabetes mellitus without complication ([place])    uncontrolled  . Family history of adverse reaction to anesthesia    "entire family takes a long time to wake up- N&V for several days"  . Family history of lung cancer   . Hypertension   . Migraine   . PONV (postoperative nausea and vomiting)      PAST SURGICAL HISTORY: Past Surgical History:  Procedure Laterality Date  . BREAST CYST EXCISION  1994  . DILATION AND CURETTAGE OF UTERUS    . PORTACATH PLACEMENT Right 08/01/2018   Procedure:  INSERTION PORT-A-CATH WITH ULTRASOUND;  Surgeon: Autumn Overall, MD;  Location: [place];  Service: General;  Laterality: Right;     FAMILY HISTORY: Family History  Problem Relation Age of Onset  . Hypertension Father   . Cancer Father 30       lung cancer  . Stroke Father   . Heart disease Mother         arrhythmia  . Hypertension Mother   . Kidney failure Sister    Autumn Cook's father died from lung cancer at age 20; he was a heavy smoker. Patients' mother is 35 as of 07/2018. The patient has 3 brothers and 2 sisters. Patient denies anyone in her family having breast, ovarian, prostate, or pancreatic cancer. Autumn Cook's maternal great great uncle was diagnosed with lung cancer.     GYNECOLOGIC HISTORY:  Patient's last menstrual period was 11/14/2013. 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 Her husband, Autumn Cook, is a Administrator. At home with them is two toy poodles. Autumn Cook has 4 children, Autumn Cook, Autumn Cook, and Autumn Cook. Autumn Cook is 45, lives in [place], and works in Science writer. Autumn Cook is 62, lives in [place], and works in Engineer, technical sales. Autumn Cook is 37, is taking care of Autumn Cook, and works as a Control and instrumentation engineer. Autumn Cook is 4, lives in [place], and is a Buyer, retail.   ADVANCED DIRECTIVES: In the absence of any documentation, Autumn Cook's spouse, Autumn Cook, is her healthcare power of attorney.     HEALTH MAINTENANCE: Social History   Tobacco Use  . Smoking status: Former Smoker    Packs/day: 1.00    Years: 5.00    Pack years: 5.00    Quit date: 05/08/1988    Years since quitting: 30.4  . Smokeless tobacco: Never Used  Substance Use Topics  . Alcohol use: No  . Drug use: No    Colonoscopy: yes, 2010 - due 2020.  PAP: 2017  Bone density: yes, 2015, normal Mammography: 07/01/2018   Allergies  Allergen Reactions  . Penicillins Hives    Did it involve swelling of the face/tongue/throat, SOB, or low BP? Yes--syncope episode Did it involve sudden or severe rash/hives, skin peeling, or any reaction on the inside of your mouth or nose? No Did you need to seek  medical attention at a hospital or doctor's office? Yes When did it last happen? 37-38 YEARS AGO If all above answers are "NO", may proceed with cephalosporin use.   . Metformin And Related Diarrhea  . Adhesive [Tape] Other (See Comments)    "severe bruising"  ALSO SKIN GLUE  . Asa [Aspirin] Other (See Comments)    GI upset     Current Outpatient Medications  Medication Sig Dispense Refill  . albuterol (VENTOLIN HFA) 108 (90 Base) MCG/ACT inhaler Inhale 2 puffs into the lungs every 4 (four) hours as needed for wheezing or shortness of breath (cough, shortness of breath or wheezing.). 1 Inhaler 1  . atenolol (TENORMIN) 100 MG tablet TAKE 1 TABLET BY MOUTH EVERY DAY (Patient taking differently: Take 100 mg by mouth every evening. ) 30 tablet 5  . atorvastatin (LIPITOR) 20 MG tablet Take 1 tablet (20 mg total) by mouth daily. 90 tablet 3  . blood glucose meter kit and supplies KIT Dispense based  on patient and insurance preference. Use up to four times daily as directed. (FOR ICD-9 250.00, 250.01). 1 each 0  . ciprofloxacin (CIPRO) 500 MG tablet Take 1 tablet (500 mg total) by mouth 2 (two) times daily. 60 tablet 0  . fluconazole (DIFLUCAN) 100 MG tablet Take 1 tablet (100 mg total) by mouth daily. 15 tablet 1  . gabapentin (NEURONTIN) 300 MG capsule Take 1 capsule (300 mg total) by mouth at bedtime. 90 capsule 4  . glipiZIDE (GLUCOTROL) 5 MG tablet Start with 5 mg once a day, then increase to twice a day after 1 week.  Continue to increase as directed by MD 120 tablet 3  . glucose blood (ACCU-CHEK ACTIVE STRIPS) test strip Use as instructed- check blood sugar up to 2x a day 100 each 12  . insulin aspart (NOVOLOG FLEXPEN) 100 UNIT/ML FlexPen If glucose 250-300 give 4 units. If glucose 301- 350 give 6 units, if glucose 351- 400 give 8 units.  If higher than 400 give 10 units.   May repeat in one hour if glucose still over 400. 15 mL 3  . Insulin Glargine (BASAGLAR KWIKPEN) 100 UNIT/ML  SOPN INJECT 14 UNITS SUBCUTANEOUSLY DAILY (Patient taking differently: Inject 16 Units into the skin daily. ) 15 mL 3  . Insulin Pen Needle (PEN NEEDLES) 32G X 4 MM MISC 1 each by Does not apply route daily. 100 each 3  . Insulin Pen Needle (PEN NEEDLES) 32G X 4 MM MISC 1 each by Does not apply route daily. 100 each 3  . lidocaine-prilocaine (EMLA) cream Apply to affected area once 30 g 3  . lisinopril-hydrochlorothiazide (ZESTORETIC) 20-12.5 MG tablet Take 2 tablets by mouth daily. (Patient taking differently: Take 2 tablets by mouth every evening. ) 180 tablet 3  . loratadine (CLARITIN) 10 MG tablet Take 1 tablet (10 mg total) by mouth daily.    Marland Kitchen LORazepam (ATIVAN) 0.5 MG tablet Take 1 tablet (0.5 mg total) by mouth at bedtime as needed (Nausea or vomiting). 30 tablet 0  . magic mouthwash SOLN Take 5 mLs by mouth 4 (four) times daily as needed for mouth pain. 180 mL 3  . omeprazole (PRILOSEC) 40 MG capsule Take 1 capsule (40 mg total) by mouth daily. 60 capsule 1  . ondansetron (ZOFRAN) 8 MG tablet Take 1 tablet (8 mg total) by mouth every 8 (eight) hours as needed for nausea or vomiting. 30 tablet 2  . ondansetron (ZOFRAN) 8 MG tablet Take 1 tablet (8 mg total) by mouth every 8 (eight) hours as needed for nausea or vomiting. 20 tablet 0  . ONE TOUCH ULTRA TEST test strip Check sugar 2 times daily 100 each prn  . ONETOUCH DELICA LANCETS 14E MISC Check sugar 2 times daily 100 each prn  . potassium chloride SA (K-DUR) 20 MEQ tablet Take 1 tablet (20 mEq total) by mouth daily. 90 tablet 4  . prochlorperazine (COMPAZINE) 10 MG tablet Take 1 tablet (10 mg total) by mouth every 6 (six) hours as needed (Nausea or vomiting). 30 tablet 1  . valACYclovir (VALTREX) 500 MG tablet Take 1 tablet (500 mg total) by mouth daily. 10 tablet 1  . Vitamin D, Ergocalciferol, (DRISDOL) 1.25 MG (50000 UT) CAPS capsule TAKE 1 CAPSULE BY MOUTH EVERY 7 DAYS (Patient taking differently: Take 50,000 Units by mouth every  Saturday. ) 12 capsule 1   No current facility-administered medications for this visit.      OBJECTIVE: Middle-aged African-American woman evaluated in the treatment  area  There were no vitals filed for this visit.   There is no height or weight on file to calculate BMI.   Wt Readings from Last 3 Encounters:  10/22/18 189 lb 12 oz (86.1 kg)  10/15/18 188 lb 8 oz (85.5 kg)  10/03/18 186 lb 4.8 oz (84.5 kg)   For vitals today please see the treatment area flowsheet   ECOG FS:1 - Symptomatic but completely ambulatory   LAB RESULTS:  CMP     Component Value Date/Time   NA 140 10/22/2018 0830   K 3.0 (LL) 10/22/2018 0830   CL 101 10/22/2018 0830   CO2 26 10/22/2018 0830   GLUCOSE 122 (H) 10/22/2018 0830   BUN 12 10/22/2018 0830   CREATININE 0.77 10/22/2018 0830   CREATININE 0.72 10/08/2018 1000   CREATININE 0.70 04/14/2015 1558   CALCIUM 9.9 10/22/2018 0830   PROT 7.4 10/22/2018 0830   ALBUMIN 3.9 10/22/2018 0830   AST 19 10/22/2018 0830   AST 17 10/08/2018 1000   ALT 25 10/22/2018 0830   ALT 23 10/08/2018 1000   ALKPHOS 57 10/22/2018 0830   BILITOT <0.2 (L) 10/22/2018 0830   BILITOT <0.2 (L) 10/08/2018 1000   GFRNONAA >60 10/22/2018 0830   GFRNONAA >60 10/08/2018 1000   GFRNONAA 87 03/27/2014 1130   GFRAA >60 10/22/2018 0830   GFRAA >60 10/08/2018 1000   GFRAA >89 03/27/2014 1130    No results found for: TOTALPROTELP, ALBUMINELP, A1GS, A2GS, BETS, BETA2SER, GAMS, MSPIKE, SPEI  No results found for: KPAFRELGTCHN, LAMBDASER, KAPLAMBRATIO  Lab Results  Component Value Date   WBC 3.1 (L) 10/22/2018   NEUTROABS 2.3 10/22/2018   HGB 9.2 (L) 10/22/2018   HCT 27.8 (L) 10/22/2018   MCV 93.6 10/22/2018   PLT 242 10/22/2018    _0 @  No results found for: LABCA2  No components found for: VZCHYI502  No results for input(s): INR in the last 168 hours.  No results found for: LABCA2  No results found for: DXA128  No results found for: NOM767  No  results found for: MCN470  No results found for: CA2729  No components found for: HGQUANT  No results found for: CEA1 / No results found for: CEA1   No results found for: AFPTUMOR  No results found for: CHROMOGRNA  No results found for: PSA1  Appointment on 10/22/2018  Component Date Value Ref Range Status  . Sodium 10/22/2018 140  135 - 145 mmol/L Final  . Potassium 10/22/2018 3.0* 3.5 - 5.1 mmol/L Final   CRITICAL RESULT CALLED TO, READ BACK BY AND VERIFIED WITH: Marlon Pel, RN [phone]   . Chloride 10/22/2018 101  98 - 111 mmol/L Final  . CO2 10/22/2018 26  22 - 32 mmol/L Final  . Glucose, Bld 10/22/2018 122* 70 - 99 mg/dL Final  . BUN 10/22/2018 12  6 - 20 mg/dL Final  . Creatinine, Ser 10/22/2018 0.77  0.44 - 1.00 mg/dL Final  . Calcium 10/22/2018 9.9  8.9 - 10.3 mg/dL Final  . Total Protein 10/22/2018 7.4  6.5 - 8.1 g/dL Final  . Albumin 10/22/2018 3.9  3.5 - 5.0 g/dL Final  . AST 10/22/2018 19  15 - 41 U/L Final  . ALT 10/22/2018 25  0 - 44 U/L Final  . Alkaline Phosphatase 10/22/2018 57  38 - 126 U/L Final  . Total Bilirubin 10/22/2018 <0.2* 0.3 - 1.2 mg/dL Final  . GFR calc non Af Amer 10/22/2018 >60  >60 mL/min Final  .  GFR calc Af Amer 10/22/2018 >60  >60 mL/min Final  . Anion gap 10/22/2018 13  5 - 15 Final   Performed at [facility], [place] [address] [place], [place] [address]  . WBC 10/22/2018 3.1* 4.0 - 10.5 K/uL Final  . RBC 10/22/2018 2.97* 3.87 - 5.11 MIL/uL Final  . Hemoglobin 10/22/2018 9.2* 12.0 - 15.0 g/dL Final  . HCT 10/22/2018 27.8* 36.0 - 46.0 % Final  . MCV 10/22/2018 93.6  80.0 - 100.0 fL Final  . MCH 10/22/2018 31.0  26.0 - 34.0 pg Final  . MCHC 10/22/2018 33.1  30.0 - 36.0 g/dL Final  . RDW 10/22/2018 17.4* 11.5 - 15.5 % Final  . Platelets 10/22/2018 242  150 - 400 K/uL Final  . nRBC 10/22/2018 0.0  0.0 - 0.2 % Final  . Neutrophils Relative % 10/22/2018 73  % Final  . Neutro Abs 10/22/2018 2.3  1.7 - 7.7 K/uL Final  .  Lymphocytes Relative 10/22/2018 16  % Final  . Lymphs Abs 10/22/2018 0.5* 0.7 - 4.0 K/uL Final  . Monocytes Relative 10/22/2018 6  % Final  . Monocytes Absolute 10/22/2018 0.2  0.1 - 1.0 K/uL Final  . Eosinophils Relative 10/22/2018 3  % Final  . Eosinophils Absolute 10/22/2018 0.1  0.0 - 0.5 K/uL Final  . Basophils Relative 10/22/2018 1  % Final  . Basophils Absolute 10/22/2018 0.0  0.0 - 0.1 K/uL Final  . Immature Granulocytes 10/22/2018 1  % Final  . Abs Immature Granulocytes 10/22/2018 0.02  0.00 - 0.07 K/uL Final   Performed at [facility], [place] [address] [place], [place] [address]    (this displays the last labs from the last 3 days)  No results found for: TOTALPROTELP, ALBUMINELP, A1GS, A2GS, BETS, BETA2SER, GAMS, MSPIKE, SPEI (this displays SPEP labs)  No results found for: KPAFRELGTCHN, LAMBDASER, KAPLAMBRATIO (kappa/lambda light chains)  No results found for: HGBA, HGBA2QUANT, HGBFQUANT, HGBSQUAN (Hemoglobinopathy evaluation)   No results found for: LDH  No results found for: IRON, TIBC, IRONPCTSAT (Iron and TIBC)  No results found for: FERRITIN  Urinalysis    Component Value Date/Time   COLORURINE YELLOW 03/07/2016 1350   APPEARANCEUR CLEAR 03/07/2016 1350   LABSPEC 1.031 (H) 03/07/2016 1350   PHURINE 5.0 03/07/2016 1350   GLUCOSEU >1000 (A) 03/07/2016 1350   HGBUR NEGATIVE 03/07/2016 1350   BILIRUBINUR NEGATIVE 03/07/2016 1350   BILIRUBINUR neg 11/24/2013 1547   KETONESUR 15 (A) 03/07/2016 1350   PROTEINUR NEGATIVE 03/07/2016 1350   UROBILINOGEN 0.2 11/24/2013 1547   UROBILINOGEN 0.2 11/10/2006 1153   NITRITE NEGATIVE 03/07/2016 1350   LEUKOCYTESUR NEGATIVE 03/07/2016 1350     STUDIES:  US Breast Ltd Uni Right Inc Axilla  Result Date: 09/23/2018 CLINICAL DATA:  Follow-up ultrasound to evaluate response to neoadjuvant chemotherapy of 2 sites of known malignancy in the right breast. EXAM: ULTRASOUND OF THE RIGHT BREAST  COMPARISON:  Previous exam(s). FINDINGS: The right breast mass at 530, 8 cm from the nipple measures 1.1 x 0.6 x 0.8 cm, previously measuring 1.4 x 1.2 x 1.4 cm. The right breast mass at 10:30, 9 cm from the nipple measures 0.6 x 0.3 x 0.5 cm, previously measuring 1.0 x 0.9 x 1.0 cm. The lymph node in the lower right axilla today measures 1.4 x 0.7 x 1.1 cm, previously measuring 3.0 x 1.9 x 3.0 cm. IMPRESSION: Interval decrease in size of the 2 sites of malignancy in the right breast, as  well as significant decrease in size of the metastatic right axillary lymph node. RECOMMENDATION: Continue treatment plan. I have discussed the findings and recommendations with the patient. Results were also provided in writing at the conclusion of the visit. If applicable, a reminder letter will be sent to the patient regarding the next appointment. BI-RADS CATEGORY  6: Known biopsy-proven malignancy. Electronically Signed   By: Ammie Ferrier M.D.   On: 09/23/2018 12:04     ELIGIBLE FOR AVAILABLE RESEARCH PROTOCOL: No   ASSESSMENT: 58 y.o. Autumn Cook, [place] woman status post right breast biopsy x2 for multicentric invasive ductal carcinoma, clinically T1c N1, stage IIB, grade 3, functionally triple negative, with an MIB-1 of 70%  (a) right axillary lymph node biopsied at the same time was positive  (1) neoadjuvant chemotherapy will consist of doxorubicin and cyclophosphamide in dose dense fashion x4 followed by weekly Abraxane and carboplatin x12 starting 08/07/2018  (2) definitive surgery to follow  (3) adjuvant radiation as appropriate  (4) genetics testing 08/06/2018 through the Breast + GYN Cancers Panel offered by [facility] found no deleterious mutations in ATM, BARD1, BRCA1, BRCA2, BRIP1, CDH1, CHEK2, DICER1, EPCAM, MLH1, MSH2, MSH6, NBN, NF1, PALB2, PMS2, PTEN, RAD50, RAD51C, RAD51D, SMARCA4, STK11, TP53.     PLAN: Alesia has received 6 of the 12 planned doses of Abraxane and carboplatin.  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  We are holding treatment today.  If her neuropathy has completely cleared by next week we will consider treating her again but very likely we are stopping at this point.  Accordingly I am setting her up for a breast MRI to be done this week if possible.  If she can see Dr. Lucia Gaskins in the next 2 weeks or so they can start planning for definitive surgery  The patient has a good understanding of this plan and agrees with it.  She knows to call for any other issue that may develop before the next visit.   Lurline Del MD  10/22/18 9:15 AM Medical Oncology and Hematology [facility] [address] [place], [place] [address] Tel. [phone]    Fax. [phone]

## 2018-10-24 ENCOUNTER — Other Ambulatory Visit: Payer: Self-pay

## 2018-10-24 DIAGNOSIS — C50811 Malignant neoplasm of overlapping sites of right female breast: Secondary | ICD-10-CM

## 2018-10-28 ENCOUNTER — Other Ambulatory Visit: Payer: Self-pay | Admitting: *Deleted

## 2018-10-28 ENCOUNTER — Encounter: Payer: Self-pay | Admitting: Oncology

## 2018-10-28 NOTE — Progress Notes (Deleted)
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  She is currently receiving weekly Abraxane and carboplatinum.  Today is day 1 cycle 7.  Her treatment was held last week due to numbness that she had noted in her toes.    REVIEW OF SYSTEMS: Patrizia   HISTORY OF CURRENT ILLNESS: From the original intake note:  Autumn Cook had routine screening mammography on 07/01/2018 showing a possible abnormality in the right breast; she notes that did feel some pain and a lump in her breast leading up to the routine screening. She underwent right breast ultrasonography at [facility] on 07/05/2018 showing: A 1.4 x 1.2 x 1.4 cm oval hypoechoic mass at the 5:30 position 8 cm from the nipple. A 0.9 x 1 x 1 cm slightly irregular hypoechoic mass at the 10:30 position 9 cm from the nipple. A 3 x 1.9 x 3 cm probable lymph node without fatty hilum in the LOWER RIGHT axilla. A RIGHT axillary lymph node with slightly thickened cortex.  Accordingly on 07/10/2018 she proceeded to biopsy of the right breast area in question. The pathology from this procedure showed ([id number]): invasive ductal carcinoma, high grade, 5:30 o'clock. Prognostic indicators  significant for: estrogen receptor, 30% positive with weak staining intensity and progesterone receptor, 0% negative. Proliferation marker Ki67 at 70%.   An additional biopsy was performed on the same day ([id number]) showing:  2. Breast, right, needle core biopsy, 10 o'clock - Invasive carcinoma  Finally, an additional biopsy was performed at the right axilla on the same day ([id number]) showing: invasive carcinoma, right axilla. Prognostic indicators significant for: estrogen receptor, 70% positive, with weak staining intensity and progesterone receptor, 0% negative. Proliferation marker Ki67 at 70%. HER2 positive by immunohistochemistry.    The patient's subsequent history is as detailed below.    PAST MEDICAL HISTORY: Past Medical History:  Diagnosis Date   Allergy    Arthritis    Asthma    Cancer ([place])    R breast cancer   Diabetes mellitus without complication ([place])    uncontrolled   Family history of adverse reaction to anesthesia    "entire family takes a long time to wake up- N&V for several days"   Family history of lung cancer    Hypertension    Migraine    PONV (postoperative nausea and vomiting)      PAST SURGICAL HISTORY: Past Surgical History:  Procedure Laterality Date   BREAST CYST EXCISION  1994   DILATION AND CURETTAGE OF UTERUS     PORTACATH PLACEMENT Right 08/01/2018   Procedure: INSERTION PORT-A-CATH WITH ULTRASOUND;  Surgeon: Alphonsa Overall, MD;  Location: [facility];  Service: General;  Laterality: Right;     FAMILY HISTORY: Family History  Problem Relation Age of Onset   Hypertension Father    Cancer Father 12  lung cancer   Stroke Father    Heart disease Mother        arrhythmia   Hypertension Mother    Kidney failure Sister    Alyss's father died from lung cancer at age 59; he was a heavy smoker. Patients' mother is 75 as of 07/2018. The patient has 3 brothers and 2 sisters. Patient denies anyone in  her family having breast, ovarian, prostate, or pancreatic cancer. Kerryn's maternal great great uncle was diagnosed with lung cancer.     GYNECOLOGIC HISTORY:  Patient's last menstrual period was 11/14/2013. 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 Her husband, [name], is a Administrator. At home with them is two toy poodles. Tyjae has 4 children, [name], Eppie Gibson, and [name]. [name] is 43, lives in [place], and works in Science writer. [name] is 37, lives in [place], and works in Engineer, technical sales. [name] is 74, is taking care of Dustyn, and works as a Control and instrumentation engineer. [name] is 64, lives in [place], and is a Buyer, retail.   ADVANCED DIRECTIVES: In the absence of any documentation, Arrabella's spouse, [name], is her healthcare power of attorney.     HEALTH MAINTENANCE: Social History   Tobacco Use   Smoking status: Former Smoker    Packs/day: 1.00    Years: 5.00    Pack years: 5.00    Quit date: 05/08/1988    Years since quitting: 30.4   Smokeless tobacco: Never Used  Substance Use Topics   Alcohol use: No   Drug use: No    Colonoscopy: yes, 2010 - due 2020.  PAP: 2017  Bone density: yes, 2015, normal Mammography: 07/01/2018   Allergies  Allergen Reactions   Penicillins Hives    Did it involve swelling of the face/tongue/throat, SOB, or low BP? Yes--syncope episode Did it involve sudden or severe rash/hives, skin peeling, or any reaction on the inside of your mouth or nose? No Did you need to seek medical attention at a hospital or doctor's office? Yes When did it last happen? 37-38 YEARS AGO If all above answers are "NO", may proceed with cephalosporin use.    Metformin And Related Diarrhea   Adhesive [Tape] Other (See Comments)      "severe bruising"  ALSO SKIN GLUE   Asa [Aspirin] Other (See Comments)    GI upset     Current Outpatient Medications  Medication Sig Dispense Refill   albuterol (VENTOLIN HFA) 108 (90 Base) MCG/ACT inhaler Inhale 2 puffs into the lungs every 4 (four) hours as needed for wheezing or shortness of breath (cough, shortness of breath or wheezing.). 1 Inhaler 1   atenolol (TENORMIN) 100 MG tablet TAKE 1 TABLET BY MOUTH EVERY DAY (Patient taking differently: Take 100 mg by mouth every evening. ) 30 tablet 5   atorvastatin (LIPITOR) 20 MG tablet Take 1 tablet (20 mg total) by mouth daily. 90 tablet 3   blood glucose meter kit and supplies KIT Dispense based on patient and insurance preference. Use up to four times daily as directed. (FOR ICD-9 250.00, 250.01). 1 each 0   ciprofloxacin (CIPRO) 500 MG tablet Take 1 tablet (500 mg total) by mouth 2 (two) times daily. 60 tablet 0   fluconazole (DIFLUCAN) 100 MG tablet Take  1 tablet (100 mg total) by mouth daily. 15 tablet 1   gabapentin (NEURONTIN) 300 MG capsule Take 1 capsule (300 mg total) by mouth at bedtime. 90 capsule 4   glipiZIDE (GLUCOTROL) 5 MG tablet Start with 5 mg once a day, then increase to twice a day after 1 week.  Continue to increase as directed by MD 120 tablet 3   glucose blood (ACCU-CHEK ACTIVE STRIPS) test strip Use as instructed- check blood sugar up to 2x a day 100 each 12   insulin aspart (NOVOLOG FLEXPEN) 100 UNIT/ML FlexPen If glucose 250-300 give 4 units. If glucose 301- 350 give 6 units, if glucose 351- 400 give 8 units.  If higher than 400 give 10 units.   May repeat in one hour if glucose still over 400. 15 mL 3   Insulin Glargine (BASAGLAR KWIKPEN) 100 UNIT/ML SOPN INJECT 14 UNITS SUBCUTANEOUSLY DAILY (Patient taking differently: Inject 16 Units into the skin daily. ) 15 mL 3   Insulin Pen Needle (PEN NEEDLES) 32G X 4 MM MISC 1 each by Does not apply route daily. 100 each 3   Insulin Pen Needle (PEN  NEEDLES) 32G X 4 MM MISC 1 each by Does not apply route daily. 100 each 3   lidocaine-prilocaine (EMLA) cream Apply to affected area once 30 g 3   lisinopril-hydrochlorothiazide (ZESTORETIC) 20-12.5 MG tablet Take 2 tablets by mouth daily. (Patient taking differently: Take 2 tablets by mouth every evening. ) 180 tablet 3   loratadine (CLARITIN) 10 MG tablet Take 1 tablet (10 mg total) by mouth daily.     LORazepam (ATIVAN) 0.5 MG tablet Take 1 tablet (0.5 mg total) by mouth at bedtime as needed (Nausea or vomiting). 30 tablet 0   magic mouthwash SOLN Take 5 mLs by mouth 4 (four) times daily as needed for mouth pain. 180 mL 3   omeprazole (PRILOSEC) 40 MG capsule Take 1 capsule (40 mg total) by mouth daily. 60 capsule 1   ondansetron (ZOFRAN) 8 MG tablet Take 1 tablet (8 mg total) by mouth every 8 (eight) hours as needed for nausea or vomiting. 30 tablet 2   ondansetron (ZOFRAN) 8 MG tablet Take 1 tablet (8 mg total) by mouth every 8 (eight) hours as needed for nausea or vomiting. 20 tablet 0   ONE TOUCH ULTRA TEST test strip Check sugar 2 times daily 100 each prn   ONETOUCH DELICA LANCETS 09N MISC Check sugar 2 times daily 100 each prn   potassium chloride SA (K-DUR) 20 MEQ tablet Take 1 tablet (20 mEq total) by mouth daily. 90 tablet 4   prochlorperazine (COMPAZINE) 10 MG tablet Take 1 tablet (10 mg total) by mouth every 6 (six) hours as needed (Nausea or vomiting). 30 tablet 1   valACYclovir (VALTREX) 500 MG tablet Take 1 tablet (500 mg total) by mouth daily. 10 tablet 1   Vitamin D, Ergocalciferol, (DRISDOL) 1.25 MG (50000 UT) CAPS capsule TAKE 1 CAPSULE BY MOUTH EVERY 7 DAYS (Patient taking differently: Take 50,000 Units by mouth every Saturday. ) 12 capsule 1   No current facility-administered medications for this visit.      OBJECTIVE:   There were no vitals filed for this visit.   There is no height or weight on file to calculate BMI.   Wt Readings from Last 3 Encounters:    10/22/18 189 lb 12 oz (86.1 kg)  10/15/18 188 lb 8 oz (85.5 kg)  10/03/18 186 lb 4.8 oz (84.5 kg)  ECOG FS:1 - Symptomatic but completely ambulatory GENERAL: Patient is a well appearing female in no acute distress HEENT:  Sclerae anicteric.  Oropharynx clear and moist. No ulcerations or evidence of oropharyngeal candidiasis. Neck is supple.  NODES:  No cervical, supraclavicular, or axillary lymphadenopathy palpated.  BREAST EXAM:  Deferred. LUNGS:  Clear to auscultation bilaterally.  No wheezes or rhonchi. HEART:  Regular rate and rhythm. No murmur appreciated. ABDOMEN:  Soft, nontender.  Positive, normoactive bowel sounds. No organomegaly palpated. MSK:  No focal spinal tenderness to palpation. Full range of motion bilaterally in the upper extremities. EXTREMITIES:  No peripheral edema.   SKIN:  Clear with no obvious rashes or skin changes. No nail dyscrasia. NEURO:  Nonfocal. Well oriented.  Appropriate affect.    LAB RESULTS:  CMP     Component Value Date/Time   NA 140 10/22/2018 0830   K 3.0 (LL) 10/22/2018 0830   CL 101 10/22/2018 0830   CO2 26 10/22/2018 0830   GLUCOSE 122 (H) 10/22/2018 0830   BUN 12 10/22/2018 0830   CREATININE 0.77 10/22/2018 0830   CREATININE 0.72 10/08/2018 1000   CREATININE 0.70 04/14/2015 1558   CALCIUM 9.9 10/22/2018 0830   PROT 7.4 10/22/2018 0830   ALBUMIN 3.9 10/22/2018 0830   AST 19 10/22/2018 0830   AST 17 10/08/2018 1000   ALT 25 10/22/2018 0830   ALT 23 10/08/2018 1000   ALKPHOS 57 10/22/2018 0830   BILITOT <0.2 (L) 10/22/2018 0830   BILITOT <0.2 (L) 10/08/2018 1000   GFRNONAA >60 10/22/2018 0830   GFRNONAA >60 10/08/2018 1000   GFRNONAA 87 03/27/2014 1130   GFRAA >60 10/22/2018 0830   GFRAA >60 10/08/2018 1000   GFRAA >89 03/27/2014 1130    No results found for: TOTALPROTELP, ALBUMINELP, A1GS, A2GS, BETS, BETA2SER, GAMS, MSPIKE, SPEI  No results found for: KPAFRELGTCHN, LAMBDASER, KAPLAMBRATIO  Lab Results  Component  Value Date   WBC 3.1 (L) 10/22/2018   NEUTROABS 2.3 10/22/2018   HGB 9.2 (L) 10/22/2018   HCT 27.8 (L) 10/22/2018   MCV 93.6 10/22/2018   PLT 242 10/22/2018    _0 @  No results found for: LABCA2  No components found for: [id number]  No results for input(s): INR in the last 168 hours.  No results found for: LABCA2  No results found for: FAO130  No results found for: QMV784  No results found for: ONG295  No results found for: CA2729  No components found for: HGQUANT  No results found for: CEA1 / No results found for: CEA1   No results found for: AFPTUMOR  No results found for: CHROMOGRNA  No results found for: PSA1  No visits with results within 3 Day(s) from this visit.  Latest known visit with results is:  Appointment on 10/22/2018  Component Date Value Ref Range Status   Sodium 10/22/2018 140  135 - 145 mmol/L Final   Potassium 10/22/2018 3.0* 3.5 - 5.1 mmol/L Final   CRITICAL RESULT CALLED TO, READ BACK BY AND VERIFIED WITH: Marlon Pel, RN [phone]    Chloride 10/22/2018 101  98 - 111 mmol/L Final   CO2 10/22/2018 26  22 - 32 mmol/L Final   Glucose, Bld 10/22/2018 122* 70 - 99 mg/dL Final   BUN 10/22/2018 12  6 - 20 mg/dL Final   Creatinine, Ser 10/22/2018 0.77  0.44 - 1.00 mg/dL Final   Calcium 10/22/2018 9.9  8.9 - 10.3 mg/dL Final   Total Protein 10/22/2018 7.4  6.5 - 8.1  g/dL Final   Albumin 10/22/2018 3.9  3.5 - 5.0 g/dL Final   AST 10/22/2018 19  15 - 41 U/L Final   ALT 10/22/2018 25  0 - 44 U/L Final   Alkaline Phosphatase 10/22/2018 57  38 - 126 U/L Final   Total Bilirubin 10/22/2018 <0.2* 0.3 - 1.2 mg/dL Final   GFR calc non Af Amer 10/22/2018 >60  >60 mL/min Final   GFR calc Af Amer 10/22/2018 >60  >60 mL/min Final   Anion gap 10/22/2018 13  5 - 15 Final   Performed at [facility], [place] [address], [place], [place] [address]   WBC 10/22/2018 3.1* 4.0 - 10.5 K/uL Final   RBC 10/22/2018 2.97* 3.87 -  5.11 MIL/uL Final   Hemoglobin 10/22/2018 9.2* 12.0 - 15.0 g/dL Final   HCT 10/22/2018 27.8* 36.0 - 46.0 % Final   MCV 10/22/2018 93.6  80.0 - 100.0 fL Final   MCH 10/22/2018 31.0  26.0 - 34.0 pg Final   MCHC 10/22/2018 33.1  30.0 - 36.0 g/dL Final   RDW 10/22/2018 17.4* 11.5 - 15.5 % Final   Platelets 10/22/2018 242  150 - 400 K/uL Final   nRBC 10/22/2018 0.0  0.0 - 0.2 % Final   Neutrophils Relative % 10/22/2018 73  % Final   Neutro Abs 10/22/2018 2.3  1.7 - 7.7 K/uL Final   Lymphocytes Relative 10/22/2018 16  % Final   Lymphs Abs 10/22/2018 0.5* 0.7 - 4.0 K/uL Final   Monocytes Relative 10/22/2018 6  % Final   Monocytes Absolute 10/22/2018 0.2  0.1 - 1.0 K/uL Final   Eosinophils Relative 10/22/2018 3  % Final   Eosinophils Absolute 10/22/2018 0.1  0.0 - 0.5 K/uL Final   Basophils Relative 10/22/2018 1  % Final   Basophils Absolute 10/22/2018 0.0  0.0 - 0.1 K/uL Final   Immature Granulocytes 10/22/2018 1  % Final   Abs Immature Granulocytes 10/22/2018 0.02  0.00 - 0.07 K/uL Final   Performed at [facility], [address] [place], [place] [address]    (this displays the last labs from the last 3 days)  No results found for: TOTALPROTELP, ALBUMINELP, A1GS, A2GS, BETS, BETA2SER, GAMS, MSPIKE, SPEI (this displays SPEP labs)  No results found for: KPAFRELGTCHN, LAMBDASER, KAPLAMBRATIO (kappa/lambda light chains)  No results found for: HGBA, HGBA2QUANT, HGBFQUANT, HGBSQUAN (Hemoglobinopathy evaluation)   No results found for: LDH  No results found for: IRON, TIBC, IRONPCTSAT (Iron and TIBC)  No results found for: FERRITIN  Urinalysis    Component Value Date/Time   COLORURINE YELLOW 03/07/2016 1350   APPEARANCEUR CLEAR 03/07/2016 1350   LABSPEC 1.031 (H) 03/07/2016 1350   PHURINE 5.0 03/07/2016 1350   GLUCOSEU >1000 (A) 03/07/2016 1350   HGBUR NEGATIVE 03/07/2016 1350   BILIRUBINUR NEGATIVE 03/07/2016 1350   BILIRUBINUR neg  11/24/2013 1547   KETONESUR 15 (A) 03/07/2016 1350   PROTEINUR NEGATIVE 03/07/2016 1350   UROBILINOGEN 0.2 11/24/2013 1547   UROBILINOGEN 0.2 11/10/2006 1153   NITRITE NEGATIVE 03/07/2016 [place] 03/07/2016 1350     STUDIES:  No results found.   ELIGIBLE FOR AVAILABLE RESEARCH PROTOCOL: No   ASSESSMENT: 58 y.o. [place], [place] woman status post right breast biopsy x2 for multicentric invasive ductal carcinoma, clinically T1c N1, stage IIB, grade 3, functionally triple negative, with an MIB-1 of 70%  (a) right axillary lymph node biopsied at the same time was positive  (1) neoadjuvant chemotherapy  will consist of doxorubicin and cyclophosphamide in dose dense fashion x4 followed by weekly Abraxane and carboplatin x12 starting 08/07/2018  (2) definitive surgery to follow  (3) adjuvant radiation as appropriate  (4) genetics testing 08/06/2018 through the Breast + GYN Cancers Panel offered by [facility] found no deleterious mutations in ATM, BARD1, BRCA1, BRCA2, BRIP1, CDH1, CHEK2, DICER1, EPCAM, MLH1, MSH2, MSH6, NBN, NF1, PALB2, PMS2, PTEN, RAD50, RAD51C, RAD51D, SMARCA4, STK11, TP53.     PLAN: Mackenzey has received 6 of the 12 planned doses of Abraxane and carboplatin.  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   Accordingly I am setting her up for a breast MRI to be done this week if possible.  If she can see Dr. Lucia Gaskins in the next 2 weeks or so they can start planning for definitive surgery  The patient has a good understanding of this plan and agrees with it.  She knows to call for any other issue that may develop before the next visit.   Wilber Bihari, NP  10/28/18 2:35 PM Medical Oncology and Hematology [facility] [address] [place], [place] [address] Tel. [phone]    Fax. [phone]

## 2018-10-29 ENCOUNTER — Other Ambulatory Visit: Payer: Managed Care, Other (non HMO)

## 2018-10-29 ENCOUNTER — Inpatient Hospital Stay: Payer: Managed Care, Other (non HMO) | Admitting: Adult Health

## 2018-10-29 ENCOUNTER — Inpatient Hospital Stay: Payer: Managed Care, Other (non HMO)

## 2018-10-29 ENCOUNTER — Encounter: Payer: Self-pay | Admitting: *Deleted

## 2018-10-29 ENCOUNTER — Ambulatory Visit: Payer: Managed Care, Other (non HMO)

## 2018-10-31 ENCOUNTER — Ambulatory Visit (HOSPITAL_COMMUNITY): Payer: Managed Care, Other (non HMO)

## 2018-10-31 ENCOUNTER — Encounter: Payer: Self-pay | Admitting: Family Medicine

## 2018-10-31 ENCOUNTER — Other Ambulatory Visit: Payer: Self-pay | Admitting: *Deleted

## 2018-10-31 ENCOUNTER — Telehealth: Payer: Self-pay | Admitting: *Deleted

## 2018-10-31 DIAGNOSIS — I10 Essential (primary) hypertension: Secondary | ICD-10-CM

## 2018-10-31 DIAGNOSIS — Z170 Estrogen receptor positive status [ER+]: Secondary | ICD-10-CM

## 2018-10-31 DIAGNOSIS — C50811 Malignant neoplasm of overlapping sites of right female breast: Secondary | ICD-10-CM

## 2018-10-31 MED ORDER — LISINOPRIL-HYDROCHLOROTHIAZIDE 20-12.5 MG PO TABS: 2.0000 | ORAL_TABLET | Freq: Every day | ORAL | 3 refills | Status: DC

## 2018-10-31 NOTE — Telephone Encounter (Signed)
This was a my chart message sent yesterday - I was not here and not sure if covering nurse spoke to pt or providers- I did note MRI was cancelled by pt -as well as " entry " entered by Breast Nurse Navigator but this RN cannot view aspect of entry.  This note will be forwarded to nurse navigator for review of concern and appropriate follow up for evaluation for surgery.  Hi,  Is there another option to do besides the MRI? I'm willing to travel to another hospital if necessary. When I tried to do an MRI it was completely unsuccessful and incredibly painful. I don't want to be billed for a service I'm unable to receive, but I do want to get an exam that will allow [place] to know what next steps are needed for my treatment. I'm willing to consider any other option.   Thank you, Autumn Cook

## 2018-10-31 NOTE — Progress Notes (Signed)
Order entered for [place] of breast to follow response to chemotherapy for surgical decisions.

## 2018-11-01 ENCOUNTER — Encounter: Payer: Self-pay | Admitting: Oncology

## 2018-11-04 ENCOUNTER — Ambulatory Visit
Admission: RE | Admit: 2018-11-04 | Discharge: 2018-11-04 | Disposition: A | Discharge status: Home / Self Care | Payer: Managed Care, Other (non HMO) | Source: Ambulatory Visit | Attending: Oncology | Admitting: Oncology

## 2018-11-04 ENCOUNTER — Encounter: Payer: Self-pay | Admitting: *Deleted

## 2018-11-04 ENCOUNTER — Other Ambulatory Visit: Payer: Self-pay | Admitting: Oncology

## 2018-11-04 ENCOUNTER — Other Ambulatory Visit: Payer: Self-pay

## 2018-11-04 DIAGNOSIS — C50811 Malignant neoplasm of overlapping sites of right female breast: Secondary | ICD-10-CM

## 2018-11-04 DIAGNOSIS — Z170 Estrogen receptor positive status [ER+]: Secondary | ICD-10-CM

## 2018-11-05 ENCOUNTER — Other Ambulatory Visit: Payer: Managed Care, Other (non HMO)

## 2018-11-05 ENCOUNTER — Ambulatory Visit: Payer: Managed Care, Other (non HMO)

## 2018-11-05 ENCOUNTER — Ambulatory Visit: Payer: Managed Care, Other (non HMO) | Admitting: Adult Health

## 2018-11-06 ENCOUNTER — Encounter: Payer: Self-pay | Admitting: Oncology

## 2018-11-07 ENCOUNTER — Other Ambulatory Visit: Payer: Self-pay | Admitting: *Deleted

## 2018-11-07 DIAGNOSIS — R05 Cough: Secondary | ICD-10-CM

## 2018-11-07 DIAGNOSIS — C50811 Malignant neoplasm of overlapping sites of right female breast: Secondary | ICD-10-CM

## 2018-11-07 DIAGNOSIS — R059 Cough, unspecified: Secondary | ICD-10-CM

## 2018-11-07 DIAGNOSIS — Z170 Estrogen receptor positive status [ER+]: Secondary | ICD-10-CM

## 2018-11-07 DIAGNOSIS — J45909 Unspecified asthma, uncomplicated: Secondary | ICD-10-CM

## 2018-11-11 ENCOUNTER — Ambulatory Visit: Payer: Managed Care, Other (non HMO) | Admitting: Oncology

## 2018-11-11 ENCOUNTER — Ambulatory Visit: Payer: Managed Care, Other (non HMO)

## 2018-11-11 ENCOUNTER — Other Ambulatory Visit: Payer: Managed Care, Other (non HMO)

## 2018-11-13 ENCOUNTER — Encounter: Payer: Self-pay | Admitting: Oncology

## 2018-11-14 ENCOUNTER — Telehealth: Payer: Self-pay | Admitting: *Deleted

## 2018-11-14 NOTE — Telephone Encounter (Signed)
Left vm for pt with appt date and time to see Dr. Lucia Gaskins to discuss surgery. Verified email sent as well with appointment information. Contact information provided for questions or needs.

## 2018-11-18 ENCOUNTER — Other Ambulatory Visit: Payer: Managed Care, Other (non HMO)

## 2018-11-18 ENCOUNTER — Ambulatory Visit: Payer: Managed Care, Other (non HMO)

## 2018-11-18 ENCOUNTER — Other Ambulatory Visit: Payer: Self-pay

## 2018-11-18 ENCOUNTER — Ambulatory Visit (HOSPITAL_COMMUNITY)
Admission: RE | Admit: 2018-11-18 | Discharge: 2018-11-18 | Disposition: A | Discharge status: Home / Self Care | Payer: Managed Care, Other (non HMO) | Source: Ambulatory Visit | Attending: Oncology | Admitting: Oncology

## 2018-11-18 DIAGNOSIS — C50811 Malignant neoplasm of overlapping sites of right female breast: Secondary | ICD-10-CM | POA: Diagnosis not present

## 2018-11-18 DIAGNOSIS — J45909 Unspecified asthma, uncomplicated: Secondary | ICD-10-CM

## 2018-11-18 DIAGNOSIS — R05 Cough: Secondary | ICD-10-CM | POA: Insufficient documentation

## 2018-11-18 DIAGNOSIS — R059 Cough, unspecified: Secondary | ICD-10-CM

## 2018-11-18 DIAGNOSIS — Z170 Estrogen receptor positive status [ER+]: Secondary | ICD-10-CM | POA: Insufficient documentation

## 2018-11-23 ENCOUNTER — Other Ambulatory Visit: Payer: Self-pay | Admitting: Family Medicine

## 2018-11-23 ENCOUNTER — Ambulatory Visit (HOSPITAL_COMMUNITY): Payer: Self-pay | Admitting: Surgery

## 2018-11-23 DIAGNOSIS — E1169 Type 2 diabetes mellitus with other specified complication: Secondary | ICD-10-CM

## 2018-11-23 DIAGNOSIS — E669 Obesity, unspecified: Secondary | ICD-10-CM

## 2018-11-23 DIAGNOSIS — C50911 Malignant neoplasm of unspecified site of right female breast: Secondary | ICD-10-CM

## 2018-11-25 ENCOUNTER — Ambulatory Visit: Payer: Managed Care, Other (non HMO) | Admitting: Adult Health

## 2018-11-25 ENCOUNTER — Other Ambulatory Visit: Payer: Managed Care, Other (non HMO)

## 2018-11-25 ENCOUNTER — Ambulatory Visit: Payer: Managed Care, Other (non HMO)

## 2018-11-29 ENCOUNTER — Encounter: Payer: Self-pay | Admitting: *Deleted

## 2018-11-30 NOTE — Progress Notes (Addendum)
[place] at [facility] [address] [place], [place] [address] [phone]  Date:  12/02/2018   Name:  Autumn Cook   DOB:  11/27/1960   MRN:  [id number]  PCP:  Darreld Mclean, MD    Chief Complaint: Diabetes, Hypertension, and Follow-up   History of Present Illness:  Autumn Cook is a 58 y.o. very pleasant female patient who presents with the following:  Following up today  History of DM, hyperlipidemia, HTN, breast cancer diagnosed in February on routine mammo She is married and has 4 children, works in Scientist, research (life sciences) estate  Her job has been slow with the pandemic - also working less due to cancer dx  Her kids are 75, 22, 61 and 68 Lab Results  Component Value Date   HGBA1C 7.9 (H) 07/01/2018   She is not on metformin due to intolerance   Needs lipids and A1c today - she is not fasting  Pap: "it has been years"- would like to do today  Colon cancer screening:done 10 years ago, it was normal.  No family history  Would like to do cologuard  Foot exam due today   Dr Lucia Gaskins is her surgeon Dr. 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  This does seem to be resolving  She will undergo further breast surgery next month   She is overall feeling well considering her cancer and the pandemic  Her BP has been trending up some which is her main concern today  She check home glucose and is also getting some lows here-  Her BP runs between 89 and 150 She may have sx if her glucose gets into the 80s  She is not on insulin- stopped all  She is on glipizide 5 mg just once a day and may get symptoms of low glucose even on this dose    BP Readings from Last 3 Encounters:  12/02/18 (!) 153/91  10/22/18 (!) 139/95  10/15/18 (!) 147/92   Atenolol 100  daily Lisinopril/hctz 40/25 kdur 20 meq daily  Glipizide 5 daily lipitor 20  Patient Active Problem List   Diagnosis Date Noted  . Genetic testing 08/13/2018  . Port-A-Cath in place 08/07/2018  . Family history of lung cancer   . Malignant neoplasm of overlapping sites of right breast in female, estrogen receptor positive ([place]) 07/23/2018  . Uncontrolled diabetes mellitus type 2 without complications ([place]) 11/94/1740  . Achilles tendonitis, bilateral 06/11/2015  . Chest pain with moderate risk for cardiac etiology 10/13/2014  . Hypercholesterolemia 10/13/2014  . Idiopathic scoliosis 08/18/2014  . Hypertension 09/11/2012  . Overweight (BMI 25.0-29.9) 09/11/2012  . Migraine headache 09/11/2012  . Hx of thyroid nodule 09/11/2012  . History of tobacco use 09/11/2012    Past Medical History:  Diagnosis Date  . Allergy   . Arthritis   . Asthma   . Cancer [facility])    R breast cancer  . Diabetes mellitus without complication ([place])    uncontrolled  . Family history of adverse reaction to anesthesia    "entire family takes a long time to wake up- N&V for several days"  . Family history of lung cancer   . Hypertension   . Migraine   . PONV (postoperative nausea and  vomiting)     Past Surgical History:  Procedure Laterality Date  . BREAST CYST EXCISION  1994  . DILATION AND CURETTAGE OF UTERUS    . PORTACATH PLACEMENT Right 08/01/2018   Procedure: INSERTION PORT-A-CATH WITH ULTRASOUND;  Surgeon: Alphonsa Overall, MD;  Location: [place];  Service: General;  Laterality: Right;    Social History   Tobacco Use  . Smoking status: Former Smoker    Packs/day: 1.00    Years: 5.00    Pack years: 5.00    Quit date: 05/08/1988    Years since quitting: 30.5  . Smokeless tobacco: Never Used  Substance Use Topics  . Alcohol use: No  . Drug use: No    Family History  Problem Relation Age of Onset  . Hypertension Father   . Cancer Father 79       lung cancer  .  Stroke Father   . Heart disease Mother        arrhythmia  . Hypertension Mother   . Kidney failure Sister     Allergies  Allergen Reactions  . Penicillins Hives    Did it involve swelling of the face/tongue/throat, SOB, or low BP? Yes--syncope episode Did it involve sudden or severe rash/hives, skin peeling, or any reaction on the inside of your mouth or nose? No Did you need to seek medical attention at a hospital or doctor's office? Yes When did it last happen? 37-38 YEARS AGO If all above answers are "NO", may proceed with cephalosporin use.   . Metformin And Related Diarrhea  . Adhesive [Tape] Other (See Comments)    "severe bruising"  ALSO SKIN GLUE  . Asa [Aspirin] Other (See Comments)    GI upset     Medication list has been reviewed and updated.  Current Outpatient Medications on File Prior to Visit  Medication Sig Dispense Refill  . albuterol (VENTOLIN HFA) 108 (90 Base) MCG/ACT inhaler Inhale 2 puffs into the lungs every 4 (four) hours as needed for wheezing or shortness of breath (cough, shortness of breath or wheezing.). 1 Inhaler 1  . atenolol (TENORMIN) 100 MG tablet TAKE 1 TABLET BY MOUTH EVERY DAY (Patient taking differently: Take 100 mg by mouth every evening. ) 30 tablet 5  . blood glucose meter kit and supplies KIT Dispense based on patient and insurance preference. Use up to four times daily as directed. (FOR ICD-9 250.00, 250.01). 1 each 0  . glipiZIDE (GLUCOTROL) 5 MG tablet START WITH 1 TAB ONCE A DAY, THEN INCREASE TO TWICE A DAY AFTER 1 WEEK. CONTINUE TO INCREASE AS DIRECTED BY MD 120 tablet 3  . lisinopril-hydrochlorothiazide (ZESTORETIC) 20-12.5 MG tablet Take 2 tablets by mouth daily. 180 tablet 3  . Vitamin D, Ergocalciferol, (DRISDOL) 1.25 MG (50000 UT) CAPS capsule TAKE 1 CAPSULE BY MOUTH EVERY 7 DAYS (Patient taking differently: Take 50,000 Units by mouth every Saturday. ) 12 capsule 1  . atorvastatin (LIPITOR) 20 MG tablet Take 1 tablet (20  mg total) by mouth daily. (Patient not taking: Reported on 12/02/2018) 90 tablet 3  . glucose blood (ACCU-CHEK ACTIVE STRIPS) test strip Use as instructed- check blood sugar up to 2x a day 100 each 12  . insulin aspart (NOVOLOG FLEXPEN) 100 UNIT/ML FlexPen If glucose 250-300 give 4 units. If glucose 301- 350 give 6 units, if glucose 351- 400 give 8 units.  If higher than 400 give 10 units.   May repeat in one hour if glucose still over 400. (Patient  not taking: Reported on 12/02/2018) 15 mL 3  . Insulin Glargine (BASAGLAR KWIKPEN) 100 UNIT/ML SOPN INJECT 14 UNITS SUBCUTANEOUSLY DAILY (Patient not taking: Reported on 12/02/2018) 15 mL 3  . Insulin Pen Needle (PEN NEEDLES) 32G X 4 MM MISC 1 each by Does not apply route daily. (Patient not taking: Reported on 12/02/2018) 100 each 3  . Insulin Pen Needle (PEN NEEDLES) 32G X 4 MM MISC 1 each by Does not apply route daily. (Patient not taking: Reported on 12/02/2018) 100 each 3  . lidocaine-prilocaine (EMLA) cream Apply to affected area once (Patient not taking: Reported on 12/02/2018) 30 g 3  . loratadine (CLARITIN) 10 MG tablet Take 1 tablet (10 mg total) by mouth daily.    Marland Kitchen LORazepam (ATIVAN) 0.5 MG tablet Take 1 tablet (0.5 mg total) by mouth at bedtime as needed (Nausea or vomiting). (Patient not taking: Reported on 12/02/2018) 30 tablet 0  . magic mouthwash SOLN Take 5 mLs by mouth 4 (four) times daily as needed for mouth pain. (Patient not taking: Reported on 12/02/2018) 180 mL 3  . omeprazole (PRILOSEC) 40 MG capsule Take 1 capsule (40 mg total) by mouth daily. (Patient not taking: Reported on 12/02/2018) 60 capsule 1  . ondansetron (ZOFRAN) 8 MG tablet Take 1 tablet (8 mg total) by mouth every 8 (eight) hours as needed for nausea or vomiting. (Patient not taking: Reported on 12/02/2018) 30 tablet 2  . ondansetron (ZOFRAN) 8 MG tablet Take 1 tablet (8 mg total) by mouth every 8 (eight) hours as needed for nausea or vomiting. (Patient not taking: Reported on  12/02/2018) 20 tablet 0  . ONE TOUCH ULTRA TEST test strip Check sugar 2 times daily (Patient not taking: Reported on 12/02/2018) 100 each prn  . ONETOUCH DELICA LANCETS 95M MISC Check sugar 2 times daily (Patient not taking: Reported on 12/02/2018) 100 each prn  . potassium chloride SA (K-DUR) 20 MEQ tablet Take 1 tablet (20 mEq total) by mouth daily. (Patient not taking: Reported on 12/02/2018) 90 tablet 4  . prochlorperazine (COMPAZINE) 10 MG tablet Take 1 tablet (10 mg total) by mouth every 6 (six) hours as needed (Nausea or vomiting). (Patient not taking: Reported on 12/02/2018) 30 tablet 1  . valACYclovir (VALTREX) 500 MG tablet Take 1 tablet (500 mg total) by mouth daily. (Patient not taking: Reported on 12/02/2018) 10 tablet 1   No current facility-administered medications on file prior to visit.     Review of Systems:  As per HPI- otherwise negative.  No fever or chills Pain and numbness in her right foot is resolving  Physical Examination: Vitals:   12/02/18 0954  BP: (!) 153/91  Pulse: 62  Resp: 18  Temp: 98.5 F (36.9 C)  SpO2: 100%   Vitals:   12/02/18 0954  Weight: 189 lb 3.2 oz (85.8 kg)  Height: 5' 6"  (1.676 m)   Body mass index is 30.54 kg/m. Ideal Body Weight: Weight in (lb) to have BMI = 25: 154.6  GEN: WDWN, NAD, Non-toxic, A & O x 3, overweight, looks well  HEENT: Atraumatic, Normocephalic. Neck supple. No masses, No LAD.  TM wnl bilaterally  Ears and Nose: No external deformity. CV: RRR, No M/G/R. No JVD. No thrill. No extra heart sounds. PULM: CTA B, no wheezes, crackles, rhonchi. No retractions. No resp. distress. No accessory muscle use. ABD: S, NT, ND, +BS. No rebound. No HSM. EXTR: No c/c/e NEURO Normal gait.  PSYCH: Normally interactive. Conversant. Not depressed or anxious appearing.  Calm demeanor.  Foot exam done today  Pap done today- Pelvic: normal, no vaginal lesions or discharge. Uterus normal, no CMT, no adnexal tendereness or  masses   Assessment and Plan:   ICD-10-CM   1. Essential hypertension  M62 Basic metabolic panel    amLODipine (NORVASC) 5 MG tablet  2. Dyslipidemia  E78.5 Lipid panel  3. Diabetes mellitus type 2 in obese (HCC)  E11.69 Hemoglobin A1c   E66.9   4. Malignant neoplasm of overlapping sites of right breast in female, estrogen receptor positive ([place])  C50.811    Z17.0   5. Screening for cervical cancer  Z12.4 Cytology - PAP  6. Medication monitoring encounter  H47.65 Basic metabolic panel   Add amlodipine 2.5 mg to her current BP regimen Pap today Ordered cologuard Will plan further follow- up pending labs. Hold glipizide due to symptomatic lows pending her A1c  Follow-up: No follow-ups on file.  Meds ordered this encounter  Medications  . amLODipine (NORVASC) 5 MG tablet    Sig: Take 1 tablet (5 mg total) by mouth daily. Start with 2.5 mg    Dispense:  90 tablet    Refill:  3   Orders Placed This Encounter  Procedures  . Hemoglobin A1c  . Lipid panel  . Basic metabolic panel    @SIGN @    Signed Lamar Blinks, MD  Received her labs 7/28- message to pt Results for orders placed or performed in visit on 12/02/18  Hemoglobin A1c  Result Value Ref Range   Hgb A1c MFr Bld 5.7 4.6 - 6.5 %  Lipid panel  Result Value Ref Range   Cholesterol 234 (H) 0 - 200 mg/dL   Triglycerides 278.0 (H) 0.0 - 149.0 mg/dL   HDL 39.70 >39.00 mg/dL   VLDL 55.6 (H) 0.0 - 40.0 mg/dL   Total CHOL/HDL Ratio 6    NonHDL 465.03   Basic metabolic panel  Result Value Ref Range   Sodium 140 135 - 145 mEq/L   Potassium 3.3 (L) 3.5 - 5.1 mEq/L   Chloride 100 96 - 112 mEq/L   CO2 31 19 - 32 mEq/L   Glucose, Bld 130 (H) 70 - 99 mg/dL   BUN 12 6 - 23 mg/dL   Creatinine, Ser 0.77 0.40 - 1.20 mg/dL   Calcium 10.1 8.4 - 10.5 mg/dL   GFR 93.67 >60.00 mL/min  LDL cholesterol, direct  Result Value Ref Range   Direct LDL 149.0 mg/dL  Cytology - PAP  Result Value Ref Range   Adequacy       Satisfactory for evaluation  endocervical/transformation zone component PRESENT.   Diagnosis      NEGATIVE FOR INTRAEPITHELIAL LESIONS OR MALIGNANCY.   HPV NOT Detected    Material Submitted CervicoVaginal Pap [ThinPrep Imaged]      Your A1c shows that your diabetes is under very good control- certainly ok to stop diabetes medication for now.  We will monitor this  Your cholesterol however is too high- I would suggest that we put you back on a cholesterol med to help reduce your risk of heart attack or stroke.  What do you think?   Metabolic profile is ok except for slightly low potassium.  You are still taking your daily potassium pill? Pap is normal, HPV negative- good news  Please let me know about the potassium and cholesterol med Otherwise, let's visit in 4 months to check on your A1c

## 2018-12-02 ENCOUNTER — Ambulatory Visit: Payer: Managed Care, Other (non HMO) | Admitting: Adult Health

## 2018-12-02 ENCOUNTER — Other Ambulatory Visit: Payer: Managed Care, Other (non HMO)

## 2018-12-02 ENCOUNTER — Other Ambulatory Visit (HOSPITAL_COMMUNITY)
Admission: RE | Admit: 2018-12-02 | Discharge: 2018-12-02 | Disposition: A | Discharge status: Home / Self Care | Payer: Managed Care, Other (non HMO) | Source: Ambulatory Visit | Attending: Family Medicine | Admitting: Family Medicine

## 2018-12-02 ENCOUNTER — Encounter: Payer: Self-pay | Admitting: Family Medicine

## 2018-12-02 ENCOUNTER — Ambulatory Visit (INDEPENDENT_AMBULATORY_CARE_PROVIDER_SITE_OTHER): Payer: Managed Care, Other (non HMO) | Admitting: Family Medicine

## 2018-12-02 ENCOUNTER — Ambulatory Visit: Payer: Managed Care, Other (non HMO)

## 2018-12-02 ENCOUNTER — Other Ambulatory Visit: Payer: Self-pay

## 2018-12-02 VITALS — BP 153/91 | HR 62 | Temp 98.5°F | Resp 18 | Ht 66.0 in | Wt 189.2 lb

## 2018-12-02 DIAGNOSIS — Z5181 Encounter for therapeutic drug level monitoring: Secondary | ICD-10-CM

## 2018-12-02 DIAGNOSIS — Z170 Estrogen receptor positive status [ER+]: Secondary | ICD-10-CM

## 2018-12-02 DIAGNOSIS — C50811 Malignant neoplasm of overlapping sites of right female breast: Secondary | ICD-10-CM | POA: Diagnosis not present

## 2018-12-02 DIAGNOSIS — E669 Obesity, unspecified: Secondary | ICD-10-CM | POA: Diagnosis not present

## 2018-12-02 DIAGNOSIS — I10 Essential (primary) hypertension: Secondary | ICD-10-CM | POA: Diagnosis not present

## 2018-12-02 DIAGNOSIS — Z124 Encounter for screening for malignant neoplasm of cervix: Secondary | ICD-10-CM

## 2018-12-02 DIAGNOSIS — E785 Hyperlipidemia, unspecified: Secondary | ICD-10-CM | POA: Diagnosis not present

## 2018-12-02 DIAGNOSIS — E1169 Type 2 diabetes mellitus with other specified complication: Secondary | ICD-10-CM | POA: Diagnosis not present

## 2018-12-02 LAB — LIPID PANEL
Cholesterol: 234 mg/dL — ABNORMAL HIGH (ref 0–200)
HDL: 39.7 mg/dL (ref >39.00)
NonHDL: 193.86
Total CHOL/HDL Ratio: 6
Triglycerides: 278 mg/dL — ABNORMAL HIGH (ref 0.0–149.0)
VLDL: 55.6 mg/dL — ABNORMAL HIGH (ref 0.0–40.0)

## 2018-12-02 LAB — HEMOGLOBIN A1C: Hgb A1c MFr Bld: 5.7 % (ref 4.6–6.5)

## 2018-12-02 LAB — BASIC METABOLIC PANEL
BUN: 12 mg/dL (ref 6–23)
CO2: 31 mEq/L (ref 19–32)
Calcium: 10.1 mg/dL (ref 8.4–10.5)
Chloride: 100 mEq/L (ref 96–112)
Creatinine, Ser: 0.77 mg/dL (ref 0.40–1.20)
GFR: 93.67 mL/min (ref >60.00)
Glucose, Bld: 130 mg/dL — ABNORMAL HIGH (ref 70–99)
Potassium: 3.3 mEq/L — ABNORMAL LOW (ref 3.5–5.1)
Sodium: 140 mEq/L (ref 135–145)

## 2018-12-02 LAB — LDL CHOLESTEROL, DIRECT: Direct LDL: 149 mg/dL

## 2018-12-02 MED ORDER — AMLODIPINE BESYLATE 5 MG PO TABS: 5.0000 mg | ORAL_TABLET | Freq: Every day | ORAL | 3 refills | Status: DC

## 2018-12-02 NOTE — Patient Instructions (Addendum)
It was good to see you today-  I will be in touch with your labs and pap asap We will get your A1c to determine your current diabetes control For the time being cut your glipzide to once a day- we may put you on a different med all together pending your a1c!  We will set you up for cologuard to screen for colon cancer   Let's add amlodipine for your BP- start with 2.5 mg and go up to 5 if BP still higher than 140/90

## 2018-12-03 ENCOUNTER — Encounter: Payer: Self-pay | Admitting: Family Medicine

## 2018-12-03 ENCOUNTER — Encounter: Payer: Self-pay | Admitting: *Deleted

## 2018-12-03 ENCOUNTER — Telehealth: Payer: Self-pay | Admitting: Oncology

## 2018-12-03 ENCOUNTER — Other Ambulatory Visit: Payer: Self-pay | Admitting: *Deleted

## 2018-12-03 DIAGNOSIS — Z170 Estrogen receptor positive status [ER+]: Secondary | ICD-10-CM

## 2018-12-03 DIAGNOSIS — C50811 Malignant neoplasm of overlapping sites of right female breast: Secondary | ICD-10-CM

## 2018-12-03 LAB — CYTOLOGY - PAP
Diagnosis: NEGATIVE
HPV: NOT DETECTED

## 2018-12-03 NOTE — Addendum Note (Signed)
Addended by: Darreld Mclean on: 12/03/2018 07:36 PM   Modules accepted: Orders

## 2018-12-03 NOTE — Telephone Encounter (Signed)
Scheduled appt per 7/28 sch message- unable to reach pt . Left message with appt date and time

## 2018-12-04 ENCOUNTER — Other Ambulatory Visit (HOSPITAL_COMMUNITY): Payer: Self-pay | Admitting: Surgery

## 2018-12-04 ENCOUNTER — Other Ambulatory Visit: Payer: Self-pay | Admitting: *Deleted

## 2018-12-04 DIAGNOSIS — C50911 Malignant neoplasm of unspecified site of right female breast: Secondary | ICD-10-CM

## 2018-12-04 DIAGNOSIS — I10 Essential (primary) hypertension: Secondary | ICD-10-CM

## 2018-12-04 MED ORDER — ATENOLOL 100 MG PO TABS: 100.0000 mg | ORAL_TABLET | Freq: Every day | ORAL | 1 refills | Status: DC

## 2018-12-10 ENCOUNTER — Ambulatory Visit: Payer: Managed Care, Other (non HMO)

## 2018-12-10 ENCOUNTER — Other Ambulatory Visit: Payer: Managed Care, Other (non HMO)

## 2018-12-10 ENCOUNTER — Ambulatory Visit: Payer: Managed Care, Other (non HMO) | Admitting: Adult Health

## 2018-12-17 ENCOUNTER — Ambulatory Visit: Payer: Managed Care, Other (non HMO)

## 2018-12-17 ENCOUNTER — Other Ambulatory Visit: Payer: Managed Care, Other (non HMO)

## 2018-12-17 ENCOUNTER — Ambulatory Visit: Payer: Managed Care, Other (non HMO) | Admitting: Adult Health

## 2018-12-20 ENCOUNTER — Other Ambulatory Visit: Payer: Self-pay

## 2018-12-20 ENCOUNTER — Encounter (HOSPITAL_BASED_OUTPATIENT_CLINIC_OR_DEPARTMENT_OTHER): Payer: Self-pay | Admitting: *Deleted

## 2018-12-24 ENCOUNTER — Ambulatory Visit: Payer: Managed Care, Other (non HMO) | Admitting: Oncology

## 2018-12-24 ENCOUNTER — Ambulatory Visit: Payer: Managed Care, Other (non HMO)

## 2018-12-24 ENCOUNTER — Other Ambulatory Visit: Payer: Managed Care, Other (non HMO)

## 2018-12-24 ENCOUNTER — Other Ambulatory Visit: Payer: Self-pay

## 2018-12-24 ENCOUNTER — Encounter (HOSPITAL_BASED_OUTPATIENT_CLINIC_OR_DEPARTMENT_OTHER)
Admission: RE | Admit: 2018-12-24 | Discharge: 2018-12-24 | Disposition: A | Discharge status: Home / Self Care | Payer: Managed Care, Other (non HMO) | Source: Ambulatory Visit | Attending: Surgery | Admitting: Surgery

## 2018-12-24 ENCOUNTER — Other Ambulatory Visit (HOSPITAL_COMMUNITY)
Admission: RE | Admit: 2018-12-24 | Discharge: 2018-12-24 | Disposition: A | Discharge status: Home / Self Care | Payer: Managed Care, Other (non HMO) | Source: Ambulatory Visit | Attending: Surgery | Admitting: Surgery

## 2018-12-24 DIAGNOSIS — Z01812 Encounter for preprocedural laboratory examination: Secondary | ICD-10-CM | POA: Diagnosis present

## 2018-12-24 DIAGNOSIS — Z20828 Contact with and (suspected) exposure to other viral communicable diseases: Secondary | ICD-10-CM | POA: Insufficient documentation

## 2018-12-24 LAB — BASIC METABOLIC PANEL
Anion gap: 9 (ref 5–15)
BUN: 15 mg/dL (ref 6–20)
CO2: 27 mmol/L (ref 22–32)
Calcium: 9.9 mg/dL (ref 8.9–10.3)
Chloride: 103 mmol/L (ref 98–111)
Creatinine, Ser: 0.83 mg/dL (ref 0.44–1.00)
GFR calc Af Amer: >60 mL/min (ref >60)
GFR calc non Af Amer: >60 mL/min (ref >60)
Glucose, Bld: 128 mg/dL — ABNORMAL HIGH (ref 70–99)
Potassium: 3.6 mmol/L (ref 3.5–5.1)
Sodium: 139 mmol/L (ref 135–145)

## 2018-12-24 LAB — SARS CORONAVIRUS 2 (TAT 6-24 HRS): SARS Coronavirus 2: NEGATIVE

## 2018-12-24 NOTE — Progress Notes (Signed)

## 2018-12-26 ENCOUNTER — Other Ambulatory Visit: Payer: Self-pay

## 2018-12-26 ENCOUNTER — Ambulatory Visit
Admission: RE | Admit: 2018-12-26 | Discharge: 2018-12-26 | Disposition: A | Discharge status: Home / Self Care | Payer: Managed Care, Other (non HMO) | Source: Ambulatory Visit | Attending: Surgery | Admitting: Surgery

## 2018-12-26 ENCOUNTER — Other Ambulatory Visit (HOSPITAL_COMMUNITY): Payer: Self-pay | Admitting: Surgery

## 2018-12-26 DIAGNOSIS — C50911 Malignant neoplasm of unspecified site of right female breast: Secondary | ICD-10-CM

## 2018-12-26 NOTE — H&P (Signed)
Autumn Cook Location: [facility] Patient #: [id number] DOB: [birth year] Married / Language: Undefined / Race: Black or African American Female  History of Present Illness   The patient is a 58 year old female who presents with a complaint of breast cancer.  The PCP is Dr. Janett Billow Cook  The patient was seen at the Breast [facility] - Oncology is Drs. Autumn Cook and Autumn Cook She had Autumn Cook, daughter, on the phone. She is by herself.  [The Covid-19 virus has disrupted normal medical care in [place] and across the nation. We have sometimes had to alter normal surgical/medical care to limit this epidemic and we have explained these changes to the patient.]  [name] has come to discuss her surgical options. She came up short on her neoadjuvant chemotx. She got doxorubicin and cyclophosphamide x 4 and Abraxane and carboplatin x 12 - but she only received 6 of 12 the Abraxane and carboplatin due to foot neuropathy. She could never tolerate an MRI because of her claustrophobia - so we have used [place] as a surrogate to measure her breast cancer. She had an [place] on 11/04/2018 which showed an 11 x 11 mm mass at 5:30 o'clock and a 5 x 4 mm mass at 10:30 o'clock in the right breast. Both the masses are smaller. She did not have an MRI. She came up several courses short with the treatment by Dr. Jana Cook. At the breast cancer conference on July 1 we discussed a choice of double lumpectomy versus mastectomy and targeted right axillary node dissection. Because of her breast size - we'll need to consider breast reduction, either unilaterally or bilaterally.  Plan: 1) double lumpectomy and right targeted axillary node dissection, 2) consider breast reduction planned with lumpectomy on right and breast reduction on the left - will arrange plastic surgery  History of right breast cancer: Her last mammogram was November 2018. For about a month she felt  some tenderness and maybe a mass in her right breast. This prompted her to update her mammograms. Her last period was about 3 years ago. She has no family history of breast cancer. Mammograms: [facility] - she had a mammogram on 07/05/2018. The mammogram showed a 1.4 x 1.4 cm hypoechoic mass at 5:30, 8 cm from the nipple, a 1.0 x 1.0 cm irregular hypoechoic mass 10:30 position 9 cm from the nipple, and 3.0 x 3.0 cm right axillary lymph node. Biopsy: right breast biopsy at 5:30 o'clock and 10 o'clock, on 07/10/2018 ([id number]) - shows 1) 5:30 o'clock - IDC, 2) 10 o'clock - IDC, 3) right axillary node - POSITIVE Family history of breast or ovarian cancer: On hormone therapy:  I discussed the options for breast cancer treatment with the patient. The patient is at the [facility], which includes medical oncology and radiation oncology. I discussed the surgical options of lumpectomy vs. mastectomy. If mastectomy, there is the possibility of reconstruction. I discussed the options of lymph node biopsy. The treatment plan depends on the pathologic staging of the tumor and the patient's personal wishes. The risks of surgery include, but are not limited to, bleeding, infection, the need for further surgery, and nerve injury. The patient has been given literature on the treatment of breast cancer.  Past Medical History: 1. Right breast cancer Right breast biopsy at 5:30 o'clock and 10 o'clock, on 07/10/2018 ([id number]) - shows 1) 5:30 o'clock - IDC, 2) 10 o'clock - IDC, 3) right axillary node - POSITIVE, triple negative, DHR41 -  70% Neoadjuvant chemotx, but she only received 6 of 12 the Abraxane and carboplatin due to foot neuropathy - Autumn Cook 2. DM x 1 year 3. Hyperlipidemia 4. HTN x 10 years 5. Migraine headaches - well controlled over the last year 6. Asthma - uses an inhaler infrequently 7. Last colonoscopy about 10  years ago 8. Right IJ power port - 08/01/2018 - Autumn Cook 9. Genetics were negative  Social History: Married She talks with Autumn Cook, daughter, for a lot of her medical decisions. She has 4 daughters: Autumn Cook 78 yo, Autumn Cook 56 yo, Autumn Cook 56 yo, and Autumn Cook 56 yo She works as a Insurance claims handler  Allergies (Autumn Cook, Autumn Cook; 11/15/2018 3:18 PM) Aspirin *ANALGESICS - NonNarcotic*  GI Upset metFORMIN HCl *ANTIDIABETICS*  Diarrhea. Penicillins  Hives. Allergies Reconciled   Medication History Autumn Cook, CMA; 11/15/2018 3:19 PM) Albuterol (90MCG/ACT Aerosol Soln, Inhalation) Active. Atenolol (100MG Tablet, Oral) Active. Lisinopril-hydroCHLOROthiazide (20-12.5MG Tablet, Oral) Active. Medications Reconciled  Vitals (Autumn Cook CMA; 11/15/2018 3:20 PM) 11/15/2018 3:19 PM Weight: 189.6 lb Height: 66in Body Surface Area: 1.96 m Body Mass Index: 30.6 kg/m  Temp.: 99.79F (Oral)  Pulse: 110 (Regular)  BP: 152/88(Sitting, Left Arm, Standard)   Physical Exam  General: WN AA F who is alert. She is wearing a mask. Skin: Inspection and palpation of the skin unremarkable.  Eyes: Conjunctivae white, pupils equal. Face, ears, nose, mouth, and throat: Face - normal. Normal ears and nose. Lips and teeth normal.  Neck: Supple. No mass. Trachea midline. No thyroid mass.  Lymph Nodes: No supraclavicular or cervical adenopathy. No axillary adenopathy.  Lungs: Normal respiratory effort. Clear to auscultation and symmetric breath sounds. Cardiovascular: Regular rate and rythm. Normal auscultation of the heart. No murmur or rub.  Breasts:  Right - Large breast pendulous breast. I could feel her right axillary node when I first saw her, but I can not feel it now. I could never definitely feel the two cancers in her breast. I do not feel them now.  The port on her right upper chest looks good. Left - Large pendulous breast.  No mass  Abdomen: Soft. No mass. Liver and spleen not palpable. No tenderness. No hernia. Normal bowel sounds. No abdominal scars.  Musculoskeletal/extremities: Normal gait. Good strength and ROM in upper and lower extremities.    Assessment & Plan  1.  BREAST CANCER, STAGE 2, RIGHT (C50.911)  Biopsy: right breast biopsy at 5:30 o'clock and 10 o'clock, on 07/10/2018 ([id number]) - shows 1) 5:30 o'clock - IDC, 2) 10 o'clock - IDC, 3) right axillary node - POSITIVE  Neoadjuvant chemotx, but she only received 6 of 12 the Abraxane and carboplatin due to foot neuropathy - [place]  Oncology - Autumn Cook and Autumn Cook  Plan:   1) Consider double right breast lumpectomy with targeted right axillary node dissection   2) Plastic surgery to consider bilateral breast reduction in conjunction with the lumpectomy Addendum Note(Khalon Cansler H. Lucia Gaskins MD; 11/23/2018 2:46 PM) Met with her today. She is interested in oncoplastic reconstruction. I will mark patient on day of lumpectomy, plan my surgery 7-10 d later. Thanks Brinda  2. DM x 1 year 3. Hyperlipidemia 4. HTN x 10 years 5. Migraine headaches - well controlled over the last year 6. Asthma - uses an inhaler infrequently 8. Right IJ power port - 08/01/2018 - D. Dyson Sevey 9.  I need to do an "exposure panel" - Hep B, Hep C, HIV   A tech at [facility] stuck herself with  a needle after the seeds   Alphonsa Overall, MD, [facility] Pager: [phone] Office phone:  [phone]

## 2018-12-27 ENCOUNTER — Ambulatory Visit (HOSPITAL_COMMUNITY)
Admission: RE | Admit: 2018-12-27 | Discharge: 2018-12-27 | Disposition: A | Discharge status: Home / Self Care | Payer: Managed Care, Other (non HMO) | Source: Ambulatory Visit | Attending: Surgery | Admitting: Surgery

## 2018-12-27 ENCOUNTER — Encounter (HOSPITAL_BASED_OUTPATIENT_CLINIC_OR_DEPARTMENT_OTHER): Payer: Self-pay

## 2018-12-27 ENCOUNTER — Ambulatory Visit (HOSPITAL_BASED_OUTPATIENT_CLINIC_OR_DEPARTMENT_OTHER)
Admission: RE | Admit: 2018-12-27 | Discharge: 2018-12-27 | Disposition: A | Discharge status: Home / Self Care | Payer: Managed Care, Other (non HMO) | Attending: Surgery | Admitting: Surgery

## 2018-12-27 ENCOUNTER — Ambulatory Visit
Admission: RE | Admit: 2018-12-27 | Discharge: 2018-12-27 | Disposition: A | Discharge status: Home / Self Care | Payer: Managed Care, Other (non HMO) | Source: Ambulatory Visit | Attending: Surgery | Admitting: Surgery

## 2018-12-27 ENCOUNTER — Encounter (HOSPITAL_BASED_OUTPATIENT_CLINIC_OR_DEPARTMENT_OTHER)
Admission: RE | Disposition: A | Discharge status: Home / Self Care | Payer: Self-pay | Source: Home / Self Care | Attending: Surgery

## 2018-12-27 ENCOUNTER — Other Ambulatory Visit: Payer: Self-pay

## 2018-12-27 ENCOUNTER — Ambulatory Visit (HOSPITAL_BASED_OUTPATIENT_CLINIC_OR_DEPARTMENT_OTHER): Payer: Managed Care, Other (non HMO) | Admitting: Certified Registered"

## 2018-12-27 DIAGNOSIS — E785 Hyperlipidemia, unspecified: Secondary | ICD-10-CM | POA: Insufficient documentation

## 2018-12-27 DIAGNOSIS — C50911 Malignant neoplasm of unspecified site of right female breast: Secondary | ICD-10-CM

## 2018-12-27 DIAGNOSIS — I10 Essential (primary) hypertension: Secondary | ICD-10-CM | POA: Diagnosis not present

## 2018-12-27 DIAGNOSIS — G43909 Migraine, unspecified, not intractable, without status migrainosus: Secondary | ICD-10-CM | POA: Diagnosis not present

## 2018-12-27 DIAGNOSIS — I517 Cardiomegaly: Secondary | ICD-10-CM | POA: Diagnosis not present

## 2018-12-27 DIAGNOSIS — Z87891 Personal history of nicotine dependence: Secondary | ICD-10-CM | POA: Diagnosis not present

## 2018-12-27 DIAGNOSIS — E119 Type 2 diabetes mellitus without complications: Secondary | ICD-10-CM | POA: Diagnosis not present

## 2018-12-27 DIAGNOSIS — C50811 Malignant neoplasm of overlapping sites of right female breast: Secondary | ICD-10-CM | POA: Insufficient documentation

## 2018-12-27 DIAGNOSIS — Z886 Allergy status to analgesic agent status: Secondary | ICD-10-CM | POA: Insufficient documentation

## 2018-12-27 DIAGNOSIS — Z79899 Other long term (current) drug therapy: Secondary | ICD-10-CM | POA: Insufficient documentation

## 2018-12-27 DIAGNOSIS — J45909 Unspecified asthma, uncomplicated: Secondary | ICD-10-CM | POA: Insufficient documentation

## 2018-12-27 DIAGNOSIS — Z888 Allergy status to other drugs, medicaments and biological substances status: Secondary | ICD-10-CM | POA: Insufficient documentation

## 2018-12-27 DIAGNOSIS — Z880 Allergy status to penicillin: Secondary | ICD-10-CM | POA: Insufficient documentation

## 2018-12-27 DIAGNOSIS — Z6831 Body mass index (BMI) 31.0-31.9, adult: Secondary | ICD-10-CM | POA: Diagnosis not present

## 2018-12-27 DIAGNOSIS — E669 Obesity, unspecified: Secondary | ICD-10-CM | POA: Insufficient documentation

## 2018-12-27 DIAGNOSIS — Z170 Estrogen receptor positive status [ER+]: Secondary | ICD-10-CM | POA: Diagnosis not present

## 2018-12-27 DIAGNOSIS — Z7951 Long term (current) use of inhaled steroids: Secondary | ICD-10-CM | POA: Insufficient documentation

## 2018-12-27 HISTORY — PX: BREAST LUMPECTOMY WITH RADIOACTIVE SEED AND SENTINEL LYMPH NODE BIOPSY: SHX6550

## 2018-12-27 SURGERY — BREAST LUMPECTOMY WITH RADIOACTIVE SEED AND SENTINEL LYMPH NODE BIOPSY
Anesthesia: Regional | Site: Breast | Laterality: Right

## 2018-12-27 MED ORDER — CHLORHEXIDINE GLUCONATE CLOTH 2 % EX PADS: 6.0000 | MEDICATED_PAD | Freq: Once | CUTANEOUS | Status: DC

## 2018-12-27 MED ORDER — HEPARIN SODIUM (PORCINE) 5000 UNIT/ML IJ SOLN
INTRAMUSCULAR | Status: AC
  Filled 2018-12-27: qty 1

## 2018-12-27 MED ORDER — HYDROMORPHONE HCL 1 MG/ML IJ SOLN
0.2500 mg | INTRAMUSCULAR | Status: DC | PRN
  Administered 2018-12-27: 0.5 mg via INTRAVENOUS

## 2018-12-27 MED ORDER — HYDROMORPHONE HCL 1 MG/ML IJ SOLN
INTRAMUSCULAR | Status: AC
  Filled 2018-12-27: qty 0.5

## 2018-12-27 MED ORDER — OXYCODONE HCL 5 MG PO TABS: 5.0000 mg | ORAL_TABLET | Freq: Once | ORAL | Status: DC | PRN

## 2018-12-27 MED ORDER — ONDANSETRON 4 MG PO TBDP
ORAL_TABLET | ORAL | Status: AC
  Filled 2018-12-27: qty 1

## 2018-12-27 MED ORDER — BUPIVACAINE-EPINEPHRINE (PF) 0.5% -1:200000 IJ SOLN
INTRAMUSCULAR | Status: AC
  Filled 2018-12-27: qty 30

## 2018-12-27 MED ORDER — FENTANYL CITRATE (PF) 100 MCG/2ML IJ SOLN
50.0000 ug | INTRAMUSCULAR | Status: DC | PRN
  Administered 2018-12-27: 25 ug via INTRAVENOUS

## 2018-12-27 MED ORDER — SODIUM CHLORIDE (PF) 0.9 % IJ SOLN
INTRAMUSCULAR | Status: AC
  Filled 2018-12-27: qty 10

## 2018-12-27 MED ORDER — FENTANYL CITRATE (PF) 100 MCG/2ML IJ SOLN
INTRAMUSCULAR | Status: AC
  Filled 2018-12-27: qty 2

## 2018-12-27 MED ORDER — MIDAZOLAM HCL 2 MG/2ML IJ SOLN
INTRAMUSCULAR | Status: AC
  Filled 2018-12-27: qty 2

## 2018-12-27 MED ORDER — ONDANSETRON HCL 4 MG/2ML IJ SOLN
INTRAMUSCULAR | Status: DC | PRN
  Administered 2018-12-27: 4 mg via INTRAVENOUS

## 2018-12-27 MED ORDER — SCOPOLAMINE 1 MG/3DAYS TD PT72
MEDICATED_PATCH | TRANSDERMAL | Status: AC
  Filled 2018-12-27: qty 1

## 2018-12-27 MED ORDER — BUPIVACAINE-EPINEPHRINE (PF) 0.25% -1:200000 IJ SOLN
INTRAMUSCULAR | Status: AC
  Filled 2018-12-27: qty 30

## 2018-12-27 MED ORDER — ONDANSETRON 4 MG PO TBDP
4.0000 mg | ORAL_TABLET | Freq: Once | ORAL | Status: AC
  Administered 2018-12-27: 4 mg via ORAL

## 2018-12-27 MED ORDER — HYDROCODONE-ACETAMINOPHEN 5-325 MG PO TABS: 1.0000 | ORAL_TABLET | Freq: Four times a day (QID) | ORAL | 0 refills | Status: DC | PRN

## 2018-12-27 MED ORDER — SCOPOLAMINE 1 MG/3DAYS TD PT72
1.0000 | MEDICATED_PATCH | Freq: Once | TRANSDERMAL | Status: DC
  Administered 2018-12-27: 1.5 mg via TRANSDERMAL

## 2018-12-27 MED ORDER — EPHEDRINE SULFATE 50 MG/ML IJ SOLN
INTRAMUSCULAR | Status: DC | PRN
  Administered 2018-12-27 (×5): 5 mg via INTRAVENOUS

## 2018-12-27 MED ORDER — KETOROLAC TROMETHAMINE 30 MG/ML IJ SOLN
INTRAMUSCULAR | Status: AC
  Filled 2018-12-27: qty 1

## 2018-12-27 MED ORDER — BUPIVACAINE-EPINEPHRINE (PF) 0.25% -1:200000 IJ SOLN
INTRAMUSCULAR | Status: DC | PRN
  Administered 2018-12-27: 30 mL

## 2018-12-27 MED ORDER — DEXAMETHASONE SODIUM PHOSPHATE 10 MG/ML IJ SOLN
INTRAMUSCULAR | Status: DC | PRN
  Administered 2018-12-27: 4 mg via INTRAVENOUS

## 2018-12-27 MED ORDER — GABAPENTIN 300 MG PO CAPS
ORAL_CAPSULE | ORAL | Status: AC
  Filled 2018-12-27: qty 1

## 2018-12-27 MED ORDER — CEFAZOLIN SODIUM-DEXTROSE 2-4 GM/100ML-% IV SOLN
INTRAVENOUS | Status: AC
  Filled 2018-12-27: qty 100

## 2018-12-27 MED ORDER — BUPIVACAINE HCL (PF) 0.25 % IJ SOLN
INTRAMUSCULAR | Status: AC
  Filled 2018-12-27: qty 30

## 2018-12-27 MED ORDER — PROMETHAZINE HCL 25 MG/ML IJ SOLN
INTRAMUSCULAR | Status: AC
  Filled 2018-12-27: qty 1

## 2018-12-27 MED ORDER — TECHNETIUM TC 99M SULFUR COLLOID FILTERED
1.0000 | Freq: Once | INTRAVENOUS | Status: AC | PRN
  Administered 2018-12-27: 1 via INTRADERMAL

## 2018-12-27 MED ORDER — FAMOTIDINE 20 MG PO TABS
20.0000 mg | ORAL_TABLET | Freq: Once | ORAL | Status: AC
  Administered 2018-12-27: 09:00:00 20 mg via ORAL

## 2018-12-27 MED ORDER — METHYLENE BLUE 0.5 % INJ SOLN
INTRAVENOUS | Status: AC
  Filled 2018-12-27: qty 10

## 2018-12-27 MED ORDER — LACTATED RINGERS IV SOLN
INTRAVENOUS | Status: DC
  Administered 2018-12-27 (×2): via INTRAVENOUS

## 2018-12-27 MED ORDER — SODIUM CHLORIDE (PF) 0.9 % IJ SOLN
INTRAVENOUS | Status: DC | PRN
  Administered 2018-12-27: 5 mL via INTRADERMAL

## 2018-12-27 MED ORDER — CEFAZOLIN SODIUM-DEXTROSE 2-4 GM/100ML-% IV SOLN
2.0000 g | INTRAVENOUS | Status: AC
  Administered 2018-12-27: 2 g via INTRAVENOUS

## 2018-12-27 MED ORDER — OXYCODONE HCL 5 MG/5ML PO SOLN: 5.0000 mg | Freq: Once | ORAL | Status: DC | PRN

## 2018-12-27 MED ORDER — HEPARIN SODIUM (PORCINE) 5000 UNIT/ML IJ SOLN
5000.0000 [IU] | Freq: Once | INTRAMUSCULAR | Status: AC
  Administered 2018-12-27: 5000 [IU] via SUBCUTANEOUS

## 2018-12-27 MED ORDER — FAMOTIDINE 20 MG PO TABS
ORAL_TABLET | ORAL | Status: AC
  Filled 2018-12-27: qty 1

## 2018-12-27 MED ORDER — ACETAMINOPHEN 500 MG PO TABS
ORAL_TABLET | ORAL | Status: AC
  Filled 2018-12-27: qty 2

## 2018-12-27 MED ORDER — ACETAMINOPHEN 500 MG PO TABS
1000.0000 mg | ORAL_TABLET | ORAL | Status: AC
  Administered 2018-12-27: 1000 mg via ORAL

## 2018-12-27 MED ORDER — KETOROLAC TROMETHAMINE 30 MG/ML IJ SOLN
30.0000 mg | Freq: Once | INTRAMUSCULAR | Status: AC | PRN
  Administered 2018-12-27: 30 mg via INTRAVENOUS

## 2018-12-27 MED ORDER — PROPOFOL 10 MG/ML IV BOLUS
INTRAVENOUS | Status: AC
  Filled 2018-12-27: qty 20

## 2018-12-27 MED ORDER — MIDAZOLAM HCL 2 MG/2ML IJ SOLN
1.0000 mg | INTRAMUSCULAR | Status: DC | PRN
  Administered 2018-12-27: 2 mg via INTRAVENOUS

## 2018-12-27 MED ORDER — PROMETHAZINE HCL 25 MG/ML IJ SOLN: 6.2500 mg | INTRAMUSCULAR | Status: DC | PRN

## 2018-12-27 MED ORDER — ROPIVACAINE HCL 5 MG/ML IJ SOLN
INTRAMUSCULAR | Status: DC | PRN
  Administered 2018-12-27: 30 mL via PERINEURAL

## 2018-12-27 MED ORDER — 0.9 % SODIUM CHLORIDE (POUR BTL) OPTIME
TOPICAL | Status: DC | PRN
  Administered 2018-12-27: 12:00:00 1000 mL

## 2018-12-27 MED ORDER — PROPOFOL 10 MG/ML IV BOLUS
INTRAVENOUS | Status: DC | PRN
  Administered 2018-12-27: 200 mg via INTRAVENOUS

## 2018-12-27 MED ORDER — GABAPENTIN 300 MG PO CAPS
300.0000 mg | ORAL_CAPSULE | ORAL | Status: AC
  Administered 2018-12-27: 300 mg via ORAL

## 2018-12-27 SURGICAL SUPPLY — 61 items
ADH SKN CLS APL DERMABOND .7 (GAUZE/BANDAGES/DRESSINGS) ×1
APL PRP STRL LF DISP 70% ISPRP (MISCELLANEOUS) ×1
APL SKNCLS STERI-STRIP NONHPOA (GAUZE/BANDAGES/DRESSINGS)
BENZOIN TINCTURE PRP APPL 2/3 (GAUZE/BANDAGES/DRESSINGS) IMPLANT
BINDER BREAST LRG (GAUZE/BANDAGES/DRESSINGS) IMPLANT
BINDER BREAST MEDIUM (GAUZE/BANDAGES/DRESSINGS) IMPLANT
BINDER BREAST XLRG (GAUZE/BANDAGES/DRESSINGS) IMPLANT
BINDER BREAST XXLRG (GAUZE/BANDAGES/DRESSINGS) ×1 IMPLANT
BLADE SURG 15 STRL LF DISP TIS (BLADE) ×1 IMPLANT
BLADE SURG 15 STRL SS (BLADE) ×2
CANISTER SUC SOCK COL 7IN (MISCELLANEOUS) IMPLANT
CANISTER SUCT 1200ML W/VALVE (MISCELLANEOUS) ×2 IMPLANT
CHLORAPREP W/TINT 26 (MISCELLANEOUS) ×2 IMPLANT
CLIP VESOCCLUDE SM WIDE 6/CT (CLIP) ×2 IMPLANT
COVER BACK TABLE REUSABLE LG (DRAPES) ×2 IMPLANT
COVER MAYO STAND REUSABLE (DRAPES) ×2 IMPLANT
COVER PROBE W GEL 5X96 (DRAPES) ×2 IMPLANT
COVER WAND RF STERILE (DRAPES) IMPLANT
DECANTER SPIKE VIAL GLASS SM (MISCELLANEOUS) IMPLANT
DERMABOND ADVANCED (GAUZE/BANDAGES/DRESSINGS) ×1
DERMABOND ADVANCED .7 DNX12 (GAUZE/BANDAGES/DRESSINGS) ×1 IMPLANT
DRAPE HALF SHEET 70X43 (DRAPES) ×2 IMPLANT
DRAPE LAPAROSCOPIC ABDOMINAL (DRAPES) ×2 IMPLANT
DRAPE UTILITY XL STRL (DRAPES) ×2 IMPLANT
DRSG PAD ABDOMINAL 8X10 ST (GAUZE/BANDAGES/DRESSINGS) ×2 IMPLANT
ELECT COATED BLADE 2.86 ST (ELECTRODE) ×2 IMPLANT
ELECT REM PT RETURN 9FT ADLT (ELECTROSURGICAL) ×2
ELECTRODE REM PT RTRN 9FT ADLT (ELECTROSURGICAL) ×1 IMPLANT
GAUZE SPONGE 4X4 12PLY STRL (GAUZE/BANDAGES/DRESSINGS) ×3 IMPLANT
GLOVE BIOGEL PI IND STRL 6.5 (GLOVE) IMPLANT
GLOVE BIOGEL PI IND STRL 7.0 (GLOVE) IMPLANT
GLOVE BIOGEL PI INDICATOR 6.5 (GLOVE) ×1
GLOVE BIOGEL PI INDICATOR 7.0 (GLOVE) ×2
GLOVE ECLIPSE 6.5 STRL STRAW (GLOVE) ×1 IMPLANT
GLOVE SURG SIGNA 7.5 PF LTX (GLOVE) ×4 IMPLANT
GLOVE SURG SS PI 7.0 STRL IVOR (GLOVE) ×1 IMPLANT
GLOVE SURG SYN 7.5  E (GLOVE) ×1
GLOVE SURG SYN 7.5 E (GLOVE) ×1 IMPLANT
GLOVE SURG SYN 7.5 PF PI (GLOVE) IMPLANT
GOWN STRL REUS W/ TWL LRG LVL3 (GOWN DISPOSABLE) ×1 IMPLANT
GOWN STRL REUS W/ TWL XL LVL3 (GOWN DISPOSABLE) ×1 IMPLANT
GOWN STRL REUS W/TWL LRG LVL3 (GOWN DISPOSABLE) ×4
GOWN STRL REUS W/TWL XL LVL3 (GOWN DISPOSABLE) ×2
KIT MARKER MARGIN INK (KITS) ×2 IMPLANT
NDL HYPO 25X1 1.5 SAFETY (NEEDLE) ×1 IMPLANT
NDL SAFETY ECLIPSE 18X1.5 (NEEDLE) IMPLANT
NEEDLE HYPO 18GX1.5 SHARP (NEEDLE)
NEEDLE HYPO 25X1 1.5 SAFETY (NEEDLE) ×2 IMPLANT
NS IRRIG 1000ML POUR BTL (IV SOLUTION) ×3 IMPLANT
PACK BASIN DAY SURGERY FS (CUSTOM PROCEDURE TRAY) ×2 IMPLANT
PENCIL BUTTON HOLSTER BLD 10FT (ELECTRODE) ×2 IMPLANT
SLEEVE SCD COMPRESS KNEE MED (MISCELLANEOUS) ×2 IMPLANT
SPONGE LAP 18X18 RF (DISPOSABLE) ×4 IMPLANT
STRIP CLOSURE SKIN 1/2X4 (GAUZE/BANDAGES/DRESSINGS) IMPLANT
SUT MNCRL AB 4-0 PS2 18 (SUTURE) ×3 IMPLANT
SUT VICRYL 3-0 CR8 SH (SUTURE) ×3 IMPLANT
SYR CONTROL 10ML LL (SYRINGE) ×3 IMPLANT
TOWEL GREEN STERILE FF (TOWEL DISPOSABLE) ×2 IMPLANT
TRAY FAXITRON CT DISP (TRAY / TRAY PROCEDURE) ×3 IMPLANT
TUBE CONNECTING 20X1/4 (TUBING) ×2 IMPLANT
YANKAUER SUCT BULB TIP NO VENT (SUCTIONS) ×2 IMPLANT

## 2018-12-27 NOTE — Progress Notes (Signed)
Emtional support for breast injection

## 2018-12-27 NOTE — Transfer of Care (Signed)
Immediate Anesthesia Transfer of Care Note  Patient: Autumn Cook  Procedure(s) Performed: RIGHT BREAST LUMPECTOMY WITH RADIOACTIVE SEED X2 AND RIGHT SEED TARGETED AXILLARY SENTINEL LYMPH NODE BIOPSY (Right Breast)  Patient Location: PACU  Anesthesia Type:General  Level of Consciousness: drowsy  Airway & Oxygen Therapy: Patient connected to facemask oxygen and breathing spontaneously  Post-op Assessment: Report given to RN and Post -op Vital signs reviewed and stable  Post vital signs: Reviewed and stable  Last Vitals:  Vitals Value Taken Time  BP 181/89 12/27/18 1226  Temp    Pulse 85 12/27/18 1229  Resp 15 12/27/18 1229  SpO2 100 % 12/27/18 1229  Vitals shown include unvalidated device data.  Last Pain:  Vitals:   12/27/18 0811  TempSrc: Oral  PainSc: 0-No pain      Patients Stated Pain Goal: 5 ([id number])  Complications: No apparent anesthesia complications

## 2018-12-27 NOTE — Interval H&P Note (Signed)
History and Physical Interval Note:  12/27/2018 9:26 AM  Autumn Cook  has presented today for surgery, with the diagnosis of RIGHT BREAST CANCER X2.  The various methods of treatment have been discussed with the patient and family.   Her daughter is here today.  After consideration of risks, benefits and other options for treatment, the patient has consented to  Procedure(s): RIGHT BREAST LUMPECTOMY WITH RADIOACTIVE SEED X2 AND RIGHT SEED TARGETED AXILLARY SENTINEL LYMPH NODE BIOPSY (Right) as a surgical intervention.  The patient's history has been reviewed, patient examined, no change in status, stable for surgery.  I have reviewed the patient's chart and labs.  Questions were answered to the patient's satisfaction.     Shann Medal

## 2018-12-27 NOTE — Op Note (Signed)
12/27/2018  12:24 PM  PATIENT:  Autumn Cook DOB: February 24, 1961 MRN: [id number]  PREOP DIAGNOSIS:   RIGHT BREAST CANCER X 2  POSTOP DIAGNOSIS:    Right breast cancer, 6 o'clock and 10 o'clock position (T1, N1)  PROCEDURE:   Procedure(s):  RIGHT BREAST LUMPECTOMY WITH RADIOACTIVE SEED X 2 AND RIGHT SEED TARGETED AXILLARY SENTINEL LYMPH NODE BIOPSY, Injection of peri areolar area of breast with methylene blue (1.0 cc), deep sentinel lymph node biopsy  SURGEON:   Alphonsa Overall, M.D.  ANESTHESIA:   General  Anesthesiologist: Murvin Natal, MD CRNA: Marrianne Mood, CRNA; Lavonia Dana, CRNA  General  EBL:  100  ml  DRAINS:  none   LOCAL MEDICATIONS USED:   30 cc 1/4% marcaine  SPECIMEN:   1.  Right breast lumpectomy at 6 o'clock, 2. Inferior margin of lumpectomy at 6 o'clock, 3. Right breast lumpectomy at 10 o'clock, 4. Medial margin of right breast lumpectomy at 10 o'clock, 5. Targeted right axillary lymph node dissection  COUNTS CORRECT:  YES  INDICATIONS FOR PROCEDURE:  Autumn Cook is a 58 y.o. (DOB: [birth year]) AA female whose primary care physician is Copland, Gay Filler, MD and comes for right breast lumpectomy x 2 and right targeted axillary lymph node dissection.   She had neoadjuvant chemotherapy by [name].  She has seen Dr. Iran Planas for oncoplastic surgery - with plans to do a double right breast lumpectomy and once margins are clear, to proceed with a right breast reduction.   The options for breast cancer treatment have been discussed with the patient. She elected to proceed with lumpectomy and axillary sentinel lymph node.     The indications and potential complications of surgery were explained to the patient. Potential complications include, but are not limited to, bleeding, infection, the need for further surgery, and nerve injury.    The day before surgery, Dr. Iran Planas had marked her left breast for the breast reduction.  I am using her marks to plan my  surgery.    She had two 131 seeds placed on 12/26/2018 in her right breast at [facility].  The seeds are in the 6 and 10 o'clock position of the right breast.  She also had a seed placed in her right axilla for a targeted right axillary lymph node dissection.   In the holding area, her right areola was injected with 1 millicurie of Technitium Sulfur Colloid.  OPERATIVE NOTE:   The patient was taken to operating room # 1 at [facility] where she underwent a general anesthesia  supervised by Anesthesiologist: Ellender, Karyl Kinnier, MD CRNA: Marrianne Mood, CRNA; Lavonia Dana, CRNA. Her right breast and axilla were prepped with  ChloraPrep and sterilely draped.    A time-out and the surgical check list was reviewed.    I injected about 0.5 mL of 40% methylene blue around her right areola.   She had two cancers (sseds) in her right breast, at about at the 10 o'clock and 6 o'clock position of the right breast.   I used the Neoprobe to identify the I131 seed.  I tried to excise an area around the tumor of at least 1 cm.       The tumor (seed) at 6 o'clock was only 3 cm above the inframammary fold.  I made a transverse incision about 3 to 4 cm above the inframammary fold.  I excised this block of breast tissue approximately 4  cm by  4 cm  in diameter.   I painted the lumpectomy specimen with the 6 color paint kit and did a specimen mammogram which confirmed the mass, clip, and the seed were all in the right position in the specimen.  If anything the seed was close to the inferior margin. The specimen was sent to pathology who called back to confirm that they have the seed and the specimen.   Because the 6 o'clock lumpectomy was close to the inferior margin, I excised additional inferior margin and sent this separately.  I painted this specimen.   The tumor (seed) at 10 o'clock was about 2/3 of the way from the areola to the right axilla.  So I decided to do the right axillary targeted lymph  node dissection and the lumpectomy of the 10 o'clock cancer through a single incision towards the axilla.  I made an incision to do the lumpectomy in the upper outer quadrant and the axillary dissection.   I excised this block of breast tissue approximately 4  cm by 4 cm  in diameter.   I painted the lumpectomy specimen with the 6 color paint kit and did a specimen mammogram which confirmed the mass, clip, and the seed were all in the right position in the specimen.  The seed was close to the medial margin. The specimen was sent to pathology who called back to confirm that they have the seed and the specimen.   Because the 10 o'clock lumpectomy was close to the medial margin, I excised additional medial margin and sent this separately.  I painted this specimen.   I then started the right targeted axillary lymph node dissection. I went through the same incision that I did the lumpectomy in the upper outer quadrant of the right breast.  I used the neoprobe to first find the seed in the right axilla.  After I removed the node with the seed, I did a specimen mammogram to confirm the seed and the coil in the lymph node.     Then I found the sentinel lymph node that was blue and had counts of 600.  The background has 20 counts. The lymph node was blue. I checked her internal mammary nodes and supraclavicular nodes with the neoprobe and found no other hot area. The axillary node was then sent to pathology.   The seed and the sentinel node localized different nodes.     I then irrigated the wounds with saline. I infiltrated approximately 30 mL of 1/4% Marcaine between the incisions.  I then closed all the wounds in layers using 3-0 Vicryl sutures for the deep layer. At the skin, I closed the incisions with a 4-0 Monocryl suture. The incisions were then painted with Dermabond.  She had gauze place over the wounds and placed in a breast binder.   The patient tolerated the procedure well, was transported to the  recovery room in good condition. Sponge and needle count were correct at the end of the case.   Final pathology is pending.   Alphonsa Overall, MD, [facility] Surgery Pager: [phone] Office phone:  [phone]

## 2018-12-27 NOTE — Progress Notes (Signed)
Assisted Dr. Ellender with right, ultrasound guided, pectoralis block. Side rails up, monitors on throughout procedure. See vital signs in flow sheet. Tolerated Procedure well. °

## 2018-12-27 NOTE — Anesthesia Postprocedure Evaluation (Signed)
Anesthesia Post Note  Patient: Autumn Cook  Procedure(s) Performed: RIGHT BREAST LUMPECTOMY WITH RADIOACTIVE SEED X2 AND RIGHT SEED TARGETED AXILLARY SENTINEL LYMPH NODE BIOPSY (Right Breast)     Patient location during evaluation: PACU Anesthesia Type: Regional and General Level of consciousness: awake and alert Pain management: pain level controlled Vital Signs Assessment: post-procedure vital signs reviewed and stable Respiratory status: spontaneous breathing, nonlabored ventilation, respiratory function stable and patient connected to nasal cannula oxygen Cardiovascular status: blood pressure returned to baseline and stable Postop Assessment: no apparent nausea or vomiting Anesthetic complications: no    Last Vitals:  Vitals:   12/27/18 1400 12/27/18 1445  BP: (!) 195/97 (!) (P) 174/99  Pulse: (!) 59 (P) 63  Resp: 16   Temp: 37.1 C   SpO2: 97% (P) 97%    Last Pain:  Vitals:   12/27/18 1400  TempSrc:   PainSc: 5                  Adarrius Graeff P Nadiya Pieratt

## 2018-12-27 NOTE — Anesthesia Preprocedure Evaluation (Addendum)
Anesthesia Evaluation  Patient identified by MRN, date of birth, ID band Patient awake    Reviewed: Allergy & Precautions, NPO status , Patient's Chart, lab work & pertinent test results, reviewed documented beta blocker date and time   History of Anesthesia Complications (+) PONV and history of anesthetic complications  Airway Mallampati: III  TM Distance: >3 FB Neck ROM: Full    Dental no notable dental hx.    Pulmonary asthma , former smoker,    Pulmonary exam normal breath sounds clear to auscultation       Cardiovascular hypertension, Pt. on medications and Pt. on home beta blockers Normal cardiovascular exam Rhythm:Regular Rate:Normal  ECG: NSR, rate 72  ECHO: 1. The left ventricle has normal systolic function with an ejection fraction of 60-65%. The cavity size was decreased. There is moderate concentric left ventricular hypertrophy. Left ventricular diastolic function could not be evaluated due to  nondiagnostic images. No evidence of left ventricular regional wall motion abnormalities.  2. Left atrial size was not assessed.  3. The pericardium was not assessed.  4. The mitral valve is grossly normal.  5. The tricuspid valve is grossly normal.  6. The aortic valve is grossly normal.  7. The interatrial septum was not assessed.   Neuro/Psych  Headaches, negative psych ROS   GI/Hepatic negative GI ROS, Neg liver ROS,   Endo/Other  diabetes  Renal/GU negative Renal ROS     Musculoskeletal negative musculoskeletal ROS (+)   Abdominal (+) + obese,   Peds  Hematology HLD   Anesthesia Other Findings RIGHT BREAST CANCER X2  Reproductive/Obstetrics                            Anesthesia Physical Anesthesia Plan  ASA: III  Anesthesia Plan: General and Regional   Post-op Pain Management: GA combined w/ Regional for post-op pain   Induction: Intravenous  PONV Risk Score and Plan: 4  or greater and Ondansetron, Dexamethasone, Midazolam and Treatment may vary due to age or medical condition  Airway Management Planned: LMA  Additional Equipment:   Intra-op Plan:   Post-operative Plan: Extubation in OR  Informed Consent: I have reviewed the patients History and Physical, chart, labs and discussed the procedure including the risks, benefits and alternatives for the proposed anesthesia with the patient or authorized representative who has indicated his/her understanding and acceptance.     Dental advisory given  Plan Discussed with: CRNA  Anesthesia Plan Comments:       Anesthesia Quick Evaluation

## 2018-12-27 NOTE — Discharge Instructions (Signed)
PT TOOK TORADOL at 1:15 pm > NO motrin/ ibuprofen like products before 7:15 pm today!    [facility] - DISCHARGE INSTRUCTIONS TO PATIENT  Activity:  Driving - May drive in 2 to 4 days, if doing well   Lifting - No lifting more than 15 pounds for 7 days                       Practice you Covid-19 protection:  Wear a mask, social distance, and wash your hands frequently  Wound Care:   Leave the incision dry for 2 days, then you may shower  Diet:  As tolerated  Follow up appointment:  Call Dr. Pollie Friar office [facility]) at [phone] for an appointment in 2 to 3 weeks.  Medications and dosages:  Resume your home medications.  You have a prescription for:  vicodin  Call Dr. Lucia Gaskins or his office  [phone]) if you have:  Temperature greater than 100.4,  Persistent nausea and vomiting,  Severe uncontrolled pain,  Redness, tenderness, or signs of infection (pain, swelling, redness, odor or green/yellow discharge around the site),  Difficulty breathing, headache or visual disturbances,  Any other questions or concerns you may have after discharge.  In an emergency, call 911 or go to an Emergency Department at a nearby hospital.   Post Anesthesia Home Care Instructions  Activity: Get plenty of rest for the remainder of the day. A responsible individual must stay with you for 24 hours following the procedure.  For the next 24 hours, DO NOT: -Drive a car -Paediatric nurse -Drink alcoholic beverages -Take any medication unless instructed by your physician -Make any legal decisions or sign important papers.  Meals: Start with liquid foods such as gelatin or soup. Progress to regular foods as tolerated. Avoid greasy, spicy, heavy foods. If nausea and/or vomiting occur, drink only clear liquids until the nausea and/or vomiting subsides. Call your physician if vomiting continues.  Special Instructions/Symptoms: Your throat may feel dry or sore  from the anesthesia or the breathing tube placed in your throat during surgery. If this causes discomfort, gargle with warm salt water. The discomfort should disappear within 24 hours.  If you had a scopolamine patch placed behind your ear for the management of post- operative nausea and/or vomiting:  1. The medication in the patch is effective for 72 hours, after which it should be removed.  Wrap patch in a tissue and discard in the trash. Wash hands thoroughly with soap and water. 2. You may remove the patch earlier than 72 hours if you experience unpleasant side effects which may include dry mouth, dizziness or visual disturbances. 3. Avoid touching the patch. Wash your hands with soap and water after contact with the patch.  Call your surgeon if you experience:   1.  Fever over 101.0. 2.  Inability to urinate. 3.  Nausea and/or vomiting. 4.  Extreme swelling or bruising at the surgical site. 5.  Continued bleeding from the incision. 6.  Increased pain, redness or drainage from the incision. 7.  Problems related to your pain medication. 8.  Any problems and/or concerns

## 2018-12-27 NOTE — Anesthesia Procedure Notes (Signed)
Anesthesia Regional Block: Pectoralis block   Pre-Anesthetic Checklist: ,, timeout performed, Correct Patient, Correct Site, Correct Laterality, Correct Procedure,, site marked, risks and benefits discussed, Surgical consent,  Pre-op evaluation,  At surgeon's request and post-op pain management  Laterality: Right  Prep: chloraprep       Needles:  Injection technique: Single-shot  Needle Type: Echogenic Stimulator Needle     Needle Length: 10cm  Needle Gauge: 21     Additional Needles:   Procedures:,,,, ultrasound used (permanent image in chart),,,,  Narrative:  Start time: 12/27/2018 9:00 AM End time: 12/27/2018 9:10 AM Injection made incrementally with aspirations every 5 mL.  Performed by: Personally  Anesthesiologist: Murvin Natal, MD  Additional Notes: Functioning IV was confirmed and monitors were applied.  A 157mm 21ga [place] echogenic stimulator needle was used. Sterile prep and drape,hand hygiene and sterile gloves were used. Negative aspiration and negative test dose prior to incremental administration of local anesthetic. The patient tolerated the procedure well.

## 2018-12-27 NOTE — Anesthesia Procedure Notes (Signed)
Procedure Name: LMA Insertion Date/Time: 12/27/2018 9:43 AM Performed by: Lavonia Dana, CRNA Pre-anesthesia Checklist: Patient identified, Emergency Drugs available, Suction available and Patient being monitored Patient Re-evaluated:Patient Re-evaluated prior to induction Oxygen Delivery Method: Circle system utilized Preoxygenation: Pre-oxygenation with 100% oxygen Induction Type: IV induction Ventilation: Mask ventilation without difficulty LMA: LMA inserted LMA Size: 4.0 Number of attempts: 1 Airway Equipment and Method: Bite block Placement Confirmation: positive ETCO2 Tube secured with: Tape Dental Injury: Teeth and Oropharynx as per pre-operative assessment

## 2018-12-30 ENCOUNTER — Encounter (HOSPITAL_BASED_OUTPATIENT_CLINIC_OR_DEPARTMENT_OTHER): Payer: Self-pay | Admitting: Surgery

## 2018-12-30 ENCOUNTER — Other Ambulatory Visit: Payer: Self-pay

## 2018-12-31 ENCOUNTER — Other Ambulatory Visit (HOSPITAL_COMMUNITY): Payer: Self-pay | Admitting: *Deleted

## 2018-12-31 ENCOUNTER — Encounter: Payer: Self-pay | Admitting: *Deleted

## 2018-12-31 ENCOUNTER — Other Ambulatory Visit (HOSPITAL_COMMUNITY)
Admit: 2018-12-31 | Discharge: 2018-12-31 | Disposition: A | Discharge status: Home / Self Care | Payer: Managed Care, Other (non HMO) | Source: Ambulatory Visit | Attending: Plastic Surgery | Admitting: Plastic Surgery

## 2018-12-31 DIAGNOSIS — Z20828 Contact with and (suspected) exposure to other viral communicable diseases: Secondary | ICD-10-CM | POA: Diagnosis not present

## 2018-12-31 DIAGNOSIS — Z01812 Encounter for preprocedural laboratory examination: Secondary | ICD-10-CM | POA: Insufficient documentation

## 2018-12-31 LAB — SARS CORONAVIRUS 2 (TAT 6-24 HRS): SARS Coronavirus 2: NEGATIVE
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
## 2019-01-01 ENCOUNTER — Encounter (HOSPITAL_BASED_OUTPATIENT_CLINIC_OR_DEPARTMENT_OTHER): Payer: Self-pay | Admitting: Certified Registered"

## 2019-01-02 ENCOUNTER — Encounter: Payer: Self-pay | Admitting: *Deleted

## 2019-01-02 ENCOUNTER — Encounter: Payer: Self-pay | Admitting: Oncology

## 2019-01-03 ENCOUNTER — Telehealth: Payer: Self-pay | Admitting: *Deleted

## 2019-01-03 ENCOUNTER — Encounter: Payer: Self-pay | Admitting: *Deleted

## 2019-01-03 ENCOUNTER — Ambulatory Visit (HOSPITAL_BASED_OUTPATIENT_CLINIC_OR_DEPARTMENT_OTHER)
Admission: RE | Admit: 2019-01-03 | Payer: Managed Care, Other (non HMO) | Source: Home / Self Care | Admitting: Plastic Surgery

## 2019-01-03 SURGERY — MAMMOPLASTY, REDUCTION
Anesthesia: General | Site: Breast | Laterality: Bilateral

## 2019-01-03 NOTE — Telephone Encounter (Signed)
Left vm for pt to return call regarding next steps of xrt since pt has decided to forgo reduction at this time. Contact information provided.

## 2019-01-06 ENCOUNTER — Other Ambulatory Visit: Payer: Self-pay

## 2019-01-06 ENCOUNTER — Inpatient Hospital Stay: Payer: Managed Care, Other (non HMO) | Attending: Oncology | Admitting: Adult Health

## 2019-01-06 ENCOUNTER — Encounter: Payer: Self-pay | Admitting: Adult Health

## 2019-01-06 VITALS — BP 173/89 | HR 79 | Temp 98.0°F | Resp 18 | Ht 66.0 in | Wt 193.8 lb

## 2019-01-06 DIAGNOSIS — Z801 Family history of malignant neoplasm of trachea, bronchus and lung: Secondary | ICD-10-CM | POA: Diagnosis not present

## 2019-01-06 DIAGNOSIS — C50811 Malignant neoplasm of overlapping sites of right female breast: Secondary | ICD-10-CM

## 2019-01-06 DIAGNOSIS — I10 Essential (primary) hypertension: Secondary | ICD-10-CM | POA: Diagnosis not present

## 2019-01-06 DIAGNOSIS — Z79899 Other long term (current) drug therapy: Secondary | ICD-10-CM | POA: Diagnosis not present

## 2019-01-06 DIAGNOSIS — E119 Type 2 diabetes mellitus without complications: Secondary | ICD-10-CM | POA: Insufficient documentation

## 2019-01-06 DIAGNOSIS — J45909 Unspecified asthma, uncomplicated: Secondary | ICD-10-CM | POA: Diagnosis not present

## 2019-01-06 DIAGNOSIS — C773 Secondary and unspecified malignant neoplasm of axilla and upper limb lymph nodes: Secondary | ICD-10-CM | POA: Diagnosis not present

## 2019-01-06 DIAGNOSIS — Z170 Estrogen receptor positive status [ER+]: Secondary | ICD-10-CM

## 2019-01-06 DIAGNOSIS — Z87891 Personal history of nicotine dependence: Secondary | ICD-10-CM | POA: Diagnosis not present

## 2019-01-06 DIAGNOSIS — M129 Arthropathy, unspecified: Secondary | ICD-10-CM | POA: Insufficient documentation

## 2019-01-06 NOTE — Progress Notes (Addendum)
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  She has completed neoadjuvant chemotherapy.  Since her last visit, she underwent a right breast lumpectomy, and sentinel node biopsy that showed residual invasive ductal carcinoma of 0.9cm, margins negative, and 5 SLN negative for cancer.  Since her surgery she has been in increased pain of a 10 all last week.  She took [place] but it made her very constipated, and so she didn't take it as much as she wanted to.  She took more tylenol and dealt with the pain, which really was challenging for her.  Her pain is better today, and she is now sleeping through the night, however, she called Dr. Iran Planas to reschedule her reconstruction from last Friday to perhaps later this week or after radiation, and ended up cancelling her surgery all together.  She is here to follow up with [place] and discuss what the next steps are.    REVIEW OF SYSTEMS: Lawan notes that she is now having regular bowel movements.  She denies any new fever, chills, chest pain, palpitations, cough, shortness of breath, bladder changes, or any other issues.  A detailed ROS was otherwise non contributory.    HISTORY OF CURRENT ILLNESS: From the  original intake note:  Autumn Cook had routine screening mammography on 07/01/2018 showing a possible abnormality in the right breast; she notes that did feel some pain and a lump in her breast leading up to the routine screening. She underwent right breast ultrasonography at [facility] on 07/05/2018 showing: A 1.4 x 1.2 x 1.4 cm oval hypoechoic mass at the 5:30 position 8 cm from the nipple. A 0.9 x 1 x 1 cm slightly irregular hypoechoic mass at the 10:30 position 9 cm from the nipple. A 3 x 1.9 x 3 cm probable lymph node without fatty hilum in the LOWER RIGHT axilla. A RIGHT axillary lymph node with slightly thickened cortex.  Accordingly on 07/10/2018 she proceeded to biopsy of the right breast area in question. The pathology from this procedure showed ([id number]): invasive ductal carcinoma, high grade, 5:30 o'clock. Prognostic indicators significant for: estrogen receptor, 30% positive with weak staining intensity and progesterone receptor, 0% negative. Proliferation marker Ki67 at 70%.   An additional biopsy was performed on the same day ([id number]) showing:  2. Breast, right, needle core biopsy, 10 o'clock - Invasive carcinoma  Finally, an additional biopsy was performed at the right axilla on the same day ([id number]) showing: invasive carcinoma, right axilla. Prognostic indicators significant for: estrogen receptor, 70% positive, with weak staining intensity and progesterone receptor, 0% negative. Proliferation marker Ki67 at 70%. HER2 positive by immunohistochemistry.    The patient's subsequent history is as detailed below.    PAST MEDICAL HISTORY: Past Medical History:  Diagnosis Date   Allergy    Arthritis    Asthma  Cancer ([place])    R breast cancer   Diabetes mellitus without complication ([place])    no meds now   Family history of adverse reaction to anesthesia    "entire family takes a long time to wake up- N&V for several days"   Family history of lung  cancer    Hypertension    Migraine    PONV (postoperative nausea and vomiting)      PAST SURGICAL HISTORY: Past Surgical History:  Procedure Laterality Date   BREAST CYST EXCISION  1994   BREAST LUMPECTOMY WITH RADIOACTIVE SEED AND SENTINEL LYMPH NODE BIOPSY Right 12/27/2018   Procedure: RIGHT BREAST LUMPECTOMY WITH RADIOACTIVE SEED X2 AND RIGHT SEED TARGETED AXILLARY SENTINEL LYMPH NODE BIOPSY;  Surgeon: Alphonsa Overall, MD;  Location: [facility];  Service: General;  Laterality: Right;   DILATION AND CURETTAGE OF UTERUS     PORTACATH PLACEMENT Right 08/01/2018   Procedure: INSERTION PORT-A-CATH WITH ULTRASOUND;  Surgeon: Alphonsa Overall, MD;  Location: [facility];  Service: General;  Laterality: Right;     FAMILY HISTORY: Family History  Problem Relation Age of Onset   Hypertension Father    Cancer Father 83       lung cancer   Stroke Father    Heart disease Mother        arrhythmia   Hypertension Mother    Kidney failure Sister    Chrysa's father died from lung cancer at age 74; he was a heavy smoker. Patients' mother is 43 as of 07/2018. The patient has 3 brothers and 2 sisters. Patient denies anyone in her family having breast, ovarian, prostate, or pancreatic cancer. Jia's maternal great great uncle was diagnosed with lung cancer.     GYNECOLOGIC HISTORY:  Patient's last menstrual period was 11/14/2013. 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 Her husband, [name], is a Administrator. At home with them is two toy poodles. Tonetta has 4 children, [name], Eppie Gibson, and [name]. [name] is 37, lives in [place], and works in Science writer. [name] is 31, lives in [place], and works in Engineer, technical sales. [name] is 62, is  taking care of Ramiyah, and works as a Control and instrumentation engineer. [name] is 45, lives in [place], and is a Buyer, retail.   ADVANCED DIRECTIVES: In the absence of any documentation, Jahanna's spouse, [name], is her healthcare power of attorney.     HEALTH MAINTENANCE: Social History   Tobacco Use   Smoking status: Former Smoker    Packs/day: 1.00    Years: 5.00    Pack years: 5.00    Quit date: 05/08/1988    Years since quitting: 30.6   Smokeless tobacco: Never Used  Substance Use Topics   Alcohol use: Yes    Comment: social   Drug use: No    Colonoscopy: yes, 2010 - due 2020.  PAP: 2017  Bone density: yes, 2015, normal Mammography: 07/01/2018   Allergies  Allergen Reactions   Penicillins Hives    Did it involve swelling of the face/tongue/throat, SOB, or low BP? Yes--syncope episode Did it involve sudden or severe rash/hives, skin peeling, or any reaction on the inside of your mouth or nose? No Did you need to seek medical attention at a hospital or  doctor's office? Yes When did it last happen? 37-38 YEARS AGO If all above answers are "NO", may proceed with cephalosporin use.    Metformin And Related Diarrhea   Adhesive [Tape] Other (See Comments)    "severe bruising"  ALSO SKIN GLUE   Asa [Aspirin] Other (See Comments)    GI upset     Current Outpatient Medications  Medication Sig Dispense Refill   albuterol (VENTOLIN HFA) 108 (90 Base) MCG/ACT inhaler Inhale 2 puffs into the lungs every 4 (four) hours as needed for wheezing or shortness of breath (cough, shortness of breath or wheezing.). 1 Inhaler 1   amLODipine (NORVASC) 5 MG tablet Take 1 tablet (5 mg total) by mouth daily. Start with 2.5 mg (Patient taking differently: Take 5 mg by mouth daily. Start with 2.5 mg) 90 tablet 3   atenolol (TENORMIN) 100 MG tablet Take 1 tablet (100 mg total) by mouth daily. 90 tablet 1   atorvastatin (LIPITOR) 20 MG tablet Take 1 tablet (20 mg total) by mouth  daily. 90 tablet 3   blood glucose meter kit and supplies KIT Dispense based on patient and insurance preference. Use up to four times daily as directed. (FOR ICD-9 250.00, 250.01). 1 each 0   glucose blood (ACCU-CHEK ACTIVE STRIPS) test strip Use as instructed- check blood sugar up to 2x a day 100 each 12   HYDROcodone-acetaminophen (NORCO/VICODIN) 5-325 MG tablet Take 1 tablet by mouth every 6 (six) hours as needed for moderate pain. 15 tablet 0   lidocaine-prilocaine (EMLA) cream Apply to affected area once 30 g 3   lisinopril-hydrochlorothiazide (ZESTORETIC) 20-12.5 MG tablet Take 2 tablets by mouth daily.     potassium chloride SA (K-DUR) 20 MEQ tablet Take 1 tablet (20 mEq total) by mouth daily. 90 tablet 4   Vitamin D, Ergocalciferol, (DRISDOL) 1.25 MG (50000 UT) CAPS capsule TAKE 1 CAPSULE BY MOUTH EVERY 7 DAYS (Patient taking differently: Take 50,000 Units by mouth every Saturday. ) 12 capsule 1   No current facility-administered medications for this visit.      OBJECTIVE:   Vitals:   01/06/19 1020  BP: (!) 173/89  Pulse: 79  Resp: 18  Temp: 98 F (36.7 C)  SpO2: 100%     Body mass index is 31.28 kg/m.   Wt Readings from Last 3 Encounters:  01/06/19 193 lb 12.8 oz (87.9 kg)  12/27/18 193 lb 2 oz (87.6 kg)  12/02/18 189 lb 3.2 oz (85.8 kg)    ECOG FS:1 - Symptomatic but completely ambulatory GENERAL: Patient is a well appearing female in no acute distress HEENT:  Sclerae anicteric.  Oropharynx clear and moist. No ulcerations or evidence of oropharyngeal candidiasis. Neck is supple.  NODES:  No cervical, supraclavicular, or axillary lymphadenopathy palpated.  BREAST EXAM:  Right breast s/p lumpectomy and axillary biopsy, slight swelling at site, but healing well.   LUNGS:  Clear to auscultation bilaterally.  No wheezes or rhonchi. HEART:  Regular rate and rhythm. No murmur appreciated. ABDOMEN:  Soft, nontender.  Positive, normoactive bowel sounds. No organomegaly  palpated. MSK:  No focal spinal tenderness to palpation. Full range of motion bilaterally in the upper extremities. EXTREMITIES:  No peripheral edema.   SKIN:  Clear with no obvious rashes or skin changes. No nail dyscrasia. NEURO:  Nonfocal. Well oriented.  Appropriate affect.    LAB RESULTS:  CMP     Component Value Date/Time   NA 139 12/24/2018 0900   K 3.6 12/24/2018 0900  CL 103 12/24/2018 0900   CO2 27 12/24/2018 0900   GLUCOSE 128 (H) 12/24/2018 0900   BUN 15 12/24/2018 0900   CREATININE 0.83 12/24/2018 0900   CREATININE 0.72 10/08/2018 1000   CREATININE 0.70 04/14/2015 1558   CALCIUM 9.9 12/24/2018 0900   PROT 7.4 10/22/2018 0830   ALBUMIN 3.9 10/22/2018 0830   AST 19 10/22/2018 0830   AST 17 10/08/2018 1000   ALT 25 10/22/2018 0830   ALT 23 10/08/2018 1000   ALKPHOS 57 10/22/2018 0830   BILITOT <0.2 (L) 10/22/2018 0830   BILITOT <0.2 (L) 10/08/2018 1000   GFRNONAA >60 12/24/2018 0900   GFRNONAA >60 10/08/2018 1000   GFRNONAA 87 03/27/2014 1130   GFRAA >60 12/24/2018 0900   GFRAA >60 10/08/2018 1000   GFRAA >89 03/27/2014 1130    No results found for: TOTALPROTELP, ALBUMINELP, A1GS, A2GS, BETS, BETA2SER, GAMS, MSPIKE, SPEI  No results found for: KPAFRELGTCHN, LAMBDASER, KAPLAMBRATIO  Lab Results  Component Value Date   WBC 3.1 (L) 10/22/2018   NEUTROABS 2.3 10/22/2018   HGB 9.2 (L) 10/22/2018   HCT 27.8 (L) 10/22/2018   MCV 93.6 10/22/2018   PLT 242 10/22/2018    @LASTCHEMISTRY @  No results found for: LABCA2  No components found for: [id number]  No results for input(s): INR in the last 168 hours.  No results found for: LABCA2  No results found for: QZE092  No results found for: ZRA076  No results found for: AUQ333  No results found for: CA2729  No components found for: HGQUANT  No results found for: CEA1 / No results found for: CEA1   No results found for: AFPTUMOR  No results found for: CHROMOGRNA  No results found for:  PSA1  No visits with results within 3 Day(s) from this visit.  Latest known visit with results is:  Hospital Outpatient Visit on 12/31/2018  Component Date Value Ref Range Status   SARS Coronavirus 2 12/31/2018 NEGATIVE  NEGATIVE Final   Comment: (NOTE) SARS-CoV-2 target nucleic acids are NOT DETECTED. The SARS-CoV-2 RNA is generally detectable in upper and lower respiratory specimens during the acute phase of infection. Negative results do not preclude SARS-CoV-2 infection, do not rule out co-infections with other pathogens, and should not be used as the sole basis for treatment or other patient management decisions. Negative results must be combined with clinical observations, patient history, and epidemiological information. The expected result is Negative. Fact Sheet for Patients: SugarRoll.be Fact Sheet for Healthcare Providers: https://www.woods-mathews.com/ This test is not yet approved or cleared by the [place] FDA and  has been authorized for detection and/or diagnosis of SARS-CoV-2 by FDA under an Emergency Use Authorization (EUA). This EUA will remain  in effect (meaning this test can be used) for the duration of the COVID-19 declaration under Section 56                          4(b)(1) of the Act, 21 U.S.C. section 360bbb-3(b)(1), unless the authorization is terminated or revoked sooner. Performed at [facility], [place] [address], [place], [place] [address]     (this displays the last labs from the last 3 days)  No results found for: TOTALPROTELP, ALBUMINELP, A1GS, A2GS, BETS, BETA2SER, GAMS, MSPIKE, SPEI (this displays SPEP labs)  No results found for: KPAFRELGTCHN, LAMBDASER, KAPLAMBRATIO (kappa/lambda light chains)  No results found for: HGBA, HGBA2QUANT, HGBFQUANT, HGBSQUAN (Hemoglobinopathy evaluation)   No results found for: LDH  No  results found for: IRON, TIBC, IRONPCTSAT (Iron and TIBC)  No  results found for: FERRITIN  Urinalysis    Component Value Date/Time   COLORURINE YELLOW 03/07/2016 1350   APPEARANCEUR CLEAR 03/07/2016 1350   LABSPEC 1.031 (H) 03/07/2016 1350   PHURINE 5.0 03/07/2016 1350   GLUCOSEU >1000 (A) 03/07/2016 1350   HGBUR NEGATIVE 03/07/2016 1350   BILIRUBINUR NEGATIVE 03/07/2016 1350   BILIRUBINUR neg 11/24/2013 1547   KETONESUR 15 (A) 03/07/2016 1350   PROTEINUR NEGATIVE 03/07/2016 1350   UROBILINOGEN 0.2 11/24/2013 1547   UROBILINOGEN 0.2 11/10/2006 1153   NITRITE NEGATIVE 03/07/2016 1350   LEUKOCYTESUR NEGATIVE 03/07/2016 1350     STUDIES:  Nm Sentinel Node Inj-no Rpt (breast)  Result Date: 12/27/2018 Sulfur colloid was injected by the nuclear medicine technologist for melanoma sentinel node.   Mm Breast Surgical Specimen  Result Date: 12/27/2018 CLINICAL DATA:  Specimen radiograph status post right breast lumpectomy. EXAM: SPECIMEN RADIOGRAPH OF THE RIGHT BREAST COMPARISON:  Previous exam(s). FINDINGS: Status post excision of the right breast. The radioactive seed and biopsy marker clip are present and completely intact. These findings were communicated with the OR at 11:33 a.m. IMPRESSION: Specimen radiograph of the right breast. Electronically Signed   By: Ammie Ferrier M.D.   On: 12/27/2018 11:34   Mm Breast Surgical Specimen  Result Date: 12/27/2018 CLINICAL DATA:  Evaluate surgical specimen following excision of RIGHT axillary lymph node containing metastatic disease. EXAM: SPECIMEN RADIOGRAPH OF THE RIGHT AXILLA COMPARISON:  Previous exam(s). FINDINGS: Status post excision of the RIGHT axilla. The radioactive seed and biopsy marker clip are present and completely intact. IMPRESSION: Specimen radiograph of the RIGHT axilla. Electronically Signed   By: Margarette Canada M.D.   On: 12/27/2018 10:57   Mm Breast Surgical Specimen  Result Date: 12/27/2018 CLINICAL DATA:  Evaluate surgical specimen following lumpectomy for RIGHT breast cancer.  EXAM: SPECIMEN RADIOGRAPH OF THE RIGHT BREAST COMPARISON:  Previous exam(s). FINDINGS: Status post excision of the RIGHT breast. The radioactive seed and biopsy marker clip are present and completely intact. IMPRESSION: Specimen radiograph of the RIGHT breast. Electronically Signed   By: Margarette Canada M.D.   On: 12/27/2018 10:29   Mm Rt Radioactive Seed Loc Mammo Guide  Result Date: 12/26/2018 CLINICAL DATA:  Patient for preoperative localization prior to right breast lumpectomy. EXAM: MAMMOGRAPHIC GUIDED RADIOACTIVE SEED LOCALIZATION OF THE RIGHT BREAST COMPARISON:  Previous exam(s). FINDINGS: Patient presents for radioactive seed localization prior to right lumpectomy. I met with the patient and we discussed the procedure of seed localization including benefits and alternatives. We discussed the high likelihood of a successful procedure. We discussed the risks of the procedure including infection, bleeding, tissue injury and further surgery. We discussed the low dose of radioactivity involved in the procedure. Informed, written consent was given. The usual time-out protocol was performed immediately prior to the procedure. Using mammographic guidance, sterile technique, 1% lidocaine and an I-125 radioactive seed, mass within the outer right breast coil clip was localized using a lateral approach. The follow-up mammogram images confirm the seed in the expected location and were marked for Dr. Lucia Gaskins. Follow-up survey of the patient confirms presence of the radioactive seed. Order number of I-125 seed:  [id number]. Total activity:  2.353 millicuries reference Date: 12/06/2018 The patient tolerated the procedure well and was released from the [facility]. She was given instructions regarding seed removal. IMPRESSION: Radioactive seed localization right breast. No apparent complications. Electronically Signed   By: Polly Cobia.D.  On: 12/26/2018 15:15   Mm Rt Radio Seed Ea Add Lesion Loc Mammo  Result Date:  12/26/2018 CLINICAL DATA:  Patient for preoperative localization prior to right lumpectomy. EXAM: MAMMOGRAPHIC GUIDED RADIOACTIVE SEED LOCALIZATION OF THE RIGHT BREAST COMPARISON:  Previous exam(s). FINDINGS: Patient presents for radioactive seed localization prior to right lumpectomy. I met with the patient and we discussed the procedure of seed localization including benefits and alternatives. We discussed the high likelihood of a successful procedure. We discussed the risks of the procedure including infection, bleeding, tissue injury and further surgery. We discussed the low dose of radioactivity involved in the procedure. Informed, written consent was given. The usual time-out protocol was performed immediately prior to the procedure. Using mammographic guidance, sterile technique, 1% lidocaine and an I-125 radioactive seed, ribbon shaped marking clip was localized using a medial approach. The follow-up mammogram images confirm the seed in the expected location and were marked for Dr. Lucia Gaskins. Follow-up survey of the patient confirms presence of the radioactive seed. Order number of I-125 seed:  [id number]. Total activity:  2.633 millicuries reference Date: 12/12/2018 The patient tolerated the procedure well and was released from the [facility]. She was given instructions regarding seed removal. IMPRESSION: Radioactive seed localization right breast. No apparent complications. Electronically Signed   By: Lovey Newcomer M.D.   On: 12/26/2018 15:16   [place] Rt Radioactive Seed Ea Add Lesion  Result Date: 12/26/2018 CLINICAL DATA:  Patient for preoperative localization prior to right lymph node removal. EXAM: ULTRASOUND GUIDED RADIOACTIVE SEED LOCALIZATION OF THE RIGHT BREAST COMPARISON:  Previous exam(s). FINDINGS: Patient presents for radioactive seed localization prior to right lymph node removal. I met with the patient and we discussed the procedure of seed localization including benefits and alternatives. We  discussed the high likelihood of a successful procedure. We discussed the risks of the procedure including infection, bleeding, tissue injury and further surgery. We discussed the low dose of radioactivity involved in the procedure. Informed, written consent was given. The usual time-out protocol was performed immediately prior to the procedure. Using ultrasound guidance, sterile technique, 1% lidocaine and an I-125 radioactive seed, HydroMARK clip was localized using a lateral approach. The follow-up mammogram images confirm the seed in the expected location and were marked for Dr. Lucia Gaskins. Follow-up survey of the patient confirms presence of the radioactive seed. Order number of I-125 seed:  [id number]. Total activity:  8.937 millicuries reference Date: 12/06/2018 The patient tolerated the procedure well and was released from the [facility]. She was given instructions regarding seed removal. IMPRESSION: Radioactive seed localization right breast. No apparent complications. Electronically Signed   By: Lovey Newcomer M.D.   On: 12/26/2018 15:18   Mm Clip Placement Right  Result Date: 12/26/2018 CLINICAL DATA:  Status post ultrasound-guided seed placement right axilla EXAM: DIAGNOSTIC RIGHT MAMMOGRAM POST ULTRASOUND-GUIDED RADIOACTIVE SEED PLACEMENT COMPARISON:  Previous exam(s). FINDINGS: Mammographic images were obtained following ultrasound-guided radioactive seed placement. These demonstrate radioactive seed adjacent to the [facility] clip within the right axilla. IMPRESSION: Appropriate location of the radioactive seed. Final Assessment: Post Procedure Mammograms for Seed Placement Electronically Signed   By: Lovey Newcomer M.D.   On: 12/26/2018 15:19     ELIGIBLE FOR AVAILABLE RESEARCH PROTOCOL: No   ASSESSMENT: 58 y.o. [place], [place] woman status post right breast biopsy x2 for multicentric invasive ductal carcinoma, clinically T1c N1, stage IIB, grade 3, functionally triple negative, with an MIB-1 of  70%  (a) right axillary lymph node biopsied at the same time  was positive  (1) neoadjuvant chemotherapy will consist of doxorubicin and cyclophosphamide in dose dense fashion x4 followed by weekly Abraxane and carboplatin x12 starting 08/07/2018, stopped after 2 cycles (last dose on 10/15/2018) due to peripheral neuropathy   (2) Right breast lumpectomy on 12/27/2018 shows a ypT1b ypN0 residual invasive ductal carcinoma, margins negative.  (a) 5 sentinel nodes were negative.   (b) Estrogen receptor 75% positive, weak, Progesterone receptor negative,   (3) adjuvant radiation to follow  (4) genetics testing 08/06/2018 through the Breast + GYN Cancers Panel offered by [facility] found no deleterious mutations in ATM, BARD1, BRCA1, BRCA2, BRIP1, CDH1, CHEK2, DICER1, EPCAM, MLH1, MSH2, MSH6, NBN, NF1, PALB2, PMS2, PTEN, RAD50, RAD51C, RAD51D, SMARCA4, STK11, TP53.   (5) breast reconstruction to follow    PLAN: Dewaine Oats met with myself and [name] about her surgery and changing her mind to have reconstruction prior to radiation.  She is healing well and I gave her a copy of her pathology results.  At this point, she has decided to proceed with radiation, which will start on 01/20/2019 and will seek out a second plastic surgery opinion in the interim from [facility].  I sent our navigator, Bary Castilla a message to update her.  She is going to check in with [place] in a few days to see if she can be of any assistance in getting a second opinion scheduled.    We will see Melodi back in October.  She was recommended to continue with the appropriate pandemic precautions. She knows to call for any questions that may arise between now and her next appointment.  We are happy to see her sooner if needed.     Wilber Bihari, NP  01/06/19 11:18 AM Medical Oncology and Hematology [facility] [address] [place], [place] [address] Tel. [phone]    Fax. [phone]   ADDENDUM: I  encouraged Noelie to continue to work with Dr. Iran Planas, with whom already she has a relationship, but at this point she is frustrated, will proceed to radiation, and then will seek reconstruction later, likely requiring a flap.  I was delighted at the overall response especially the LN negativity.  Once she completes her radiation we will start anti-estrogens--even if she does not receive the full benefit of strongly ER positive PR positive patients there will be some risk reduction and of couse also prophylaxis against future breast cancers  She will see me again 09/29 and we will initiate discussion at that time  I personally saw this patient and performed a substantive portion of this encounter with the listed APP documented above.   Chauncey Cruel, MD Medical Oncology and Hematology [facility] [address] [place], [place] [address] Tel. [phone]    Fax. [phone]

## 2019-01-07 ENCOUNTER — Telehealth: Payer: Self-pay | Admitting: Adult Health

## 2019-01-07 NOTE — Telephone Encounter (Signed)
I left a message regarding schedule  

## 2019-01-17 NOTE — Progress Notes (Signed)
Location of Breast Cancer: Malignant neoplasm of overlapping sites of right breast in female, estrogen receptor positive [facility])  Histology per Pathology Report: 12/27/18:  Diagnosis 1. Breast, lumpectomy, Right with seed (6 o'clock) - RESIDUAL INVASIVE DUCTAL CARCINOMA, STATUS POST NEOADJUVANT THERAPY, 0.9 CM - MARGINS UNINVOLVED BY CARCINOMA (0.6 CM, INFERIOR MARGIN) - PREVIOUS BIOPSY SITE CHANGES PRESENT - SEE ONCOLOGY TABLE BELOW 2. Breast, excision, Right additional inferior margin (6 o'clock) - NO RESIDUAL CARCINOMA IDENTIFIED 3. Breast, lumpectomy, Right with seed (10 o'clock) - BENIGN BREAST TISSUE - PREVIOUS BIOPSY SITE CHANGES PRESENT - NO RESIDUAL CARCINOMA IDENTIFIED 4. Lymph node, sentinel, biopsy, Right axillary with seed targeted - NO CARCINOMA IDENTIFIED IN ONE LYMPH NODE (0/1) 5. Lymph node, sentinel, biopsy, Right - NO CARCINOMA IDENTIFIED IN ONE LYMPH NODE (0/1) 6. Lymph node, sentinel, biopsy, Right - NO CARCINOMA IDENTIFIED IN ONE LYMPH NODE (0/1) 7. Lymph node, sentinel, biopsy, Right - NO CARCINOMA IDENTIFIED IN ONE LYMPH NODE (0/1) 8. Lymph node, sentinel, biopsy, Right - NO CARCINOMA IDENTIFIED IN ONE LYMPH NODE (0/1) - FIBROSIS AND HISTIOCYTES PRESENT WITHIN LYMPH NODE - SEE COMMENT 9. Breast, excision, Right additional medial margin (10 o'clock) - FIBROCYSTIC CHANGES INCLUDING APOCRINE METAPLASIA - NO RESIDUAL CARCINOMA IDENTIFIED    Receptor Status: ER(100% positive with weak staining intensity), PR (negative), Her2-neu (negative), Ki-(70%)  Did patient present with symptoms (if so, please note symptoms) or was this found on screening mammography?: She had routine screening mammography on 07/01/18 showing a possible abnormality in the right breast. She underwent bilateral diagnostic mammography with tomography and right breast ultrasonography at [facility] on 07/05/18 showing: a 1.4 cm mass in the LOWER INNER RIGHT breast, 1 cm mass in the UPPER-OUTER  RIGHT breast and markedly enlarged LOWER RIGHT axillary lymph node. The 2 masses within the RIGHT breast may represent masses or abnormal enlarged intraparenchymal lymph nodes.  Biopsy of the right breast on 07/10/18 showed: invasive carcinoma at the 5:30 o'clock position. At the 10 o'clock position, invasive carcinoma was again present. In the right axilla, invasive carcinoma was again present. Prognostic indicators significant for: estrogen receptor, 30% positive and progesterone receptor, 0% negative. Proliferation marker Ki67 at 70%. HER2 negative  Lymph node prognostic indicators significant for: ER 100% positive with weak staining intensity. PR negative. Ki67 at 70%. HER2 negative.   Past/Anticipated interventions by surgeon, if any: 12/27/18: PROCEDURE:   Procedure(s):  RIGHT BREAST LUMPECTOMY WITH RADIOACTIVE SEED X 2 AND RIGHT SEED TARGETED AXILLARY SENTINEL LYMPH NODE BIOPSY, Injection of peri areolar area of breast with methylene blue (1.0 cc), deep sentinel lymph node biopsy  SURGEON:   Alphonsa Overall, M.D.  Past/Anticipated interventions by medical oncology, if any: Chemotherapy 01/06/19 Per L. Delice Bison, NP: ASSESSMENT: 58 y.o. [place], [place] woman status post right breast biopsy x2 for multicentric invasive ductal carcinoma, clinically T1c N1, stage IIB, grade 3, functionally triple negative, with an MIB-1 of 70%             (a) right axillary lymph node biopsied at the same time was positive  (1) neoadjuvant chemotherapy will consist of doxorubicin and cyclophosphamide in dose dense fashion x4 followed by weekly Abraxane and carboplatin x12 starting 08/07/2018, stopped after 2 cycles (last dose on 10/15/2018) due to peripheral neuropathy   (2) Right breast lumpectomy on 12/27/2018 shows a ypT1b ypN0 residual invasive ductal carcinoma, margins negative.             (a) 5 sentinel nodes were negative.              (  b) Estrogen receptor 75% positive, weak, Progesterone receptor negative,    (3) adjuvant radiation to follow  (4) genetics testing 08/06/2018 through the Breast + GYN Cancers Panel offered by [facility] found no deleterious mutations in ATM, BARD1, BRCA1, BRCA2, BRIP1, CDH1, CHEK2, DICER1, EPCAM, MLH1, MSH2, MSH6, NBN, NF1, PALB2, PMS2, PTEN, RAD50, RAD51C, RAD51D, SMARCA4, STK11, TP53.   (5) breast reconstruction to follow    PLAN: Dewaine Oats met with myself and [name] about her surgery and changing her mind to have reconstruction prior to radiation.  She is healing well and I gave her a copy of her pathology results.  At this point, she has decided to proceed with radiation, which will start on 01/20/2019 and will seek out a second plastic surgery opinion in the interim from [facility].  I sent our navigator, Bary Castilla a message to update her.  She is going to check in with Jeda in a few days to see if she can be of any assistance in getting a second opinion scheduled.    We will see Macarena back in October.  She was recommended to continue with the appropriate pandemic precautions. She knows to call for any questions that may arise between now and her next appointment.  We are happy to see her sooner if needed.  Lymphedema issues, if any:  Pt reports numbness in axilla/upper arm and swelling in side breast.  Pain issues, if any:  Pt reports ongoing constant pain in right breast, currently 2/10.   SAFETY ISSUES:  Prior radiation? No  Pacemaker/ICD? No  Possible current pregnancy? No  Is the patient on methotrexate? No  Current Complaints / other details:  Pt presents today for consult with Dr. Sondra Come for Radiation Oncology.  BP (!) 158/91 (BP Location: Left Arm, Patient Position: Sitting)   Pulse 86   Temp 98.7 F (37.1 C) (Temporal)   Resp 18   Ht 5' 6"  (1.676 m)   Wt 193 lb 8 oz (87.8 kg)   LMP 11/14/2013   SpO2 99%   BMI 31.23 kg/m   Wt Readings from Last 3 Encounters:  01/20/19 193 lb 8 oz (87.8 kg)  01/06/19 193 lb 12.8 oz (87.9  kg)  12/27/18 193 lb 2 oz (87.6 kg)       Loma Sousa, RN 01/20/2019,1:05 PM

## 2019-01-20 ENCOUNTER — Other Ambulatory Visit: Payer: Self-pay

## 2019-01-20 ENCOUNTER — Encounter: Payer: Self-pay | Admitting: Radiation Oncology

## 2019-01-20 ENCOUNTER — Ambulatory Visit
Admission: RE | Admit: 2019-01-20 | Discharge: 2019-01-20 | Disposition: A | Discharge status: Home / Self Care | Payer: Managed Care, Other (non HMO) | Source: Ambulatory Visit | Attending: Radiation Oncology | Admitting: Radiation Oncology

## 2019-01-20 ENCOUNTER — Ambulatory Visit: Payer: Managed Care, Other (non HMO) | Admitting: Radiation Oncology

## 2019-01-20 VITALS — BP 158/91 | HR 86 | Temp 98.7°F | Resp 18 | Ht 66.0 in | Wt 193.5 lb

## 2019-01-20 DIAGNOSIS — Z170 Estrogen receptor positive status [ER+]: Secondary | ICD-10-CM

## 2019-01-20 DIAGNOSIS — C50811 Malignant neoplasm of overlapping sites of right female breast: Secondary | ICD-10-CM | POA: Insufficient documentation

## 2019-01-20 DIAGNOSIS — G629 Polyneuropathy, unspecified: Secondary | ICD-10-CM | POA: Insufficient documentation

## 2019-01-20 DIAGNOSIS — Z79899 Other long term (current) drug therapy: Secondary | ICD-10-CM | POA: Insufficient documentation

## 2019-01-20 NOTE — Progress Notes (Addendum)
Radiation Oncology         [phone] ________________________________  Name: Autumn Cook MRN: [id number]  Date: 01/20/2019  DOB: 1960-06-03  Re-Evaluation Note  CC: Copland, Gay Filler, MD  Magrinat, Virgie Dad, MD    ICD-10-CM   1. Malignant neoplasm of overlapping sites of right breast in female, estrogen receptor positive ([place])  C50.811    Z17.0     Diagnosis:   Stage IIB (ypT1b, ypN0) Right Breast Multicentric, Invasive Ductal Carcinoma, ER+ / PR- / Her2-, Grade 3  Narrative:  The patient returns today to discuss radiation treatment options. She was seen in the multidisciplinary breast clinic on 07/24/2018.   Since consultation, she underwent genetic testing on 08/06/2018. Results showed no deleterious mutations.  She has been treated with 4 cycles of doxorubicin and cyclophosphamide under [name] from 08/07/2018 to 09/16/2018. This was followed by 2 cycles of weekly Abraxane and carboplatin on 6/2 and 10/15/2018; she only received 2 out of a planned 12 cycles due to peripheral neuropathy.  She opted to proceed with right lumpectomy x2 with sentinel lymph node biopsy on 12/27/2018. Pathology from the procedure revealed: residual invasive ductal carcinoma, 0.9 cm, at 6 o'clock. Repeat prognostic panel again showed ER 75% weakly positive, PR 0% negative, and Her2 negative. No residual carcinoma was seen at 10 o'clock. All 5 biopsied lymph nodes were negative (0/5).   She was scheduled for breast reconstruction on 8.28.2020, but she opted to delay this until after radiation therapy due to pain and discomfort. Per note with Mendel Ryder, NP on 01/06/2019, Catarina also plans to seek a second plastic surgery opinion from [facility].  On review of systems, the patient reports numbness in the right axilla/upper arm, swelling to the side of the breast, and constant pain in the right breast rated 2/10. She denies any other symptoms.    Allergies:  is allergic to penicillins; metformin and related;  adhesive [tape]; and asa [aspirin].  Meds: Current Outpatient Medications  Medication Sig Dispense Refill   albuterol (VENTOLIN HFA) 108 (90 Base) MCG/ACT inhaler Inhale 2 puffs into the lungs every 4 (four) hours as needed for wheezing or shortness of breath (cough, shortness of breath or wheezing.). 1 Inhaler 1   amLODipine (NORVASC) 5 MG tablet Take 1 tablet (5 mg total) by mouth daily. Start with 2.5 mg (Patient taking differently: Take 5 mg by mouth daily. Start with 2.5 mg) 90 tablet 3   atenolol (TENORMIN) 100 MG tablet Take 1 tablet (100 mg total) by mouth daily. 90 tablet 1   blood glucose meter kit and supplies KIT Dispense based on patient and insurance preference. Use up to four times daily as directed. (FOR ICD-9 250.00, 250.01). 1 each 0   glucose blood (ACCU-CHEK ACTIVE STRIPS) test strip Use as instructed- check blood sugar up to 2x a day 100 each 12   lidocaine-prilocaine (EMLA) cream Apply to affected area once 30 g 3   lisinopril-hydrochlorothiazide (ZESTORETIC) 20-12.5 MG tablet Take 2 tablets by mouth daily.     potassium chloride SA (K-DUR) 20 MEQ tablet Take 1 tablet (20 mEq total) by mouth daily. 90 tablet 4   Vitamin D, Ergocalciferol, (DRISDOL) 1.25 MG (50000 UT) CAPS capsule TAKE 1 CAPSULE BY MOUTH EVERY 7 DAYS (Patient taking differently: Take 50,000 Units by mouth every Saturday. ) 12 capsule 1   atorvastatin (LIPITOR) 20 MG tablet Take 1 tablet (20 mg total) by mouth daily. (Patient not taking: Reported on 01/20/2019) 90 tablet 3  HYDROcodone-acetaminophen (NORCO/VICODIN) 5-325 MG tablet Take 1 tablet by mouth every 6 (six) hours as needed for moderate pain. (Patient not taking: Reported on 01/20/2019) 15 tablet 0   No current facility-administered medications for this encounter.     Physical Findings: The patient is in no acute distress. Patient is alert and oriented.  height is 5' 6" (1.676 m) and weight is 193 lb 8 oz (87.8 kg). Her temporal  temperature is 98.7 F (37.1 C). Her blood pressure is 158/91 (abnormal) and her pulse is 86. Her respiration is 18 and oxygen saturation is 99%.   Lungs are clear to auscultation bilaterally. Heart has regular rate and rhythm. No palpable cervical, supraclavicular, or axillary adenopathy. Abdomen soft, non-tender, normal bowel sounds. Patient has limited right arm/shoulder mobility due to recent surgery. Left Breast: large and pendulous with no palpable mass, nipple discharge or bleeding. Right Breast: patient has a well-healing scar in the 7 o'clock position adjacent to the inframammary fold, no signs of drainage or infection. Patient has a second scar in the low axillary region, encompassing her second lumpectomy site and lymph node surgery; this is also healing well without signs of drainage or infection. Some swelling in this area, question seroma. Breast overall is large and pendulous.  Lab Findings: Lab Results  Component Value Date   WBC 3.1 (L) 10/22/2018   HGB 9.2 (L) 10/22/2018   HCT 27.8 (L) 10/22/2018   MCV 93.6 10/22/2018   PLT 242 10/22/2018    Radiographic Findings: Nm Sentinel Node Inj-no Rpt (breast)  Result Date: 12/27/2018 Sulfur colloid was injected by the nuclear medicine technologist for melanoma sentinel node.   Mm Breast Surgical Specimen  Result Date: 12/27/2018 CLINICAL DATA:  Specimen radiograph status post right breast lumpectomy. EXAM: SPECIMEN RADIOGRAPH OF THE RIGHT BREAST COMPARISON:  Previous exam(s). FINDINGS: Status post excision of the right breast. The radioactive seed and biopsy marker clip are present and completely intact. These findings were communicated with the OR at 11:33 a.m. IMPRESSION: Specimen radiograph of the right breast. Electronically Signed   By: Ammie Ferrier M.D.   On: 12/27/2018 11:34   Mm Breast Surgical Specimen  Result Date: 12/27/2018 CLINICAL DATA:  Evaluate surgical specimen following excision of RIGHT axillary lymph node  containing metastatic disease. EXAM: SPECIMEN RADIOGRAPH OF THE RIGHT AXILLA COMPARISON:  Previous exam(s). FINDINGS: Status post excision of the RIGHT axilla. The radioactive seed and biopsy marker clip are present and completely intact. IMPRESSION: Specimen radiograph of the RIGHT axilla. Electronically Signed   By: Margarette Canada M.D.   On: 12/27/2018 10:57   Mm Breast Surgical Specimen  Result Date: 12/27/2018 CLINICAL DATA:  Evaluate surgical specimen following lumpectomy for RIGHT breast cancer. EXAM: SPECIMEN RADIOGRAPH OF THE RIGHT BREAST COMPARISON:  Previous exam(s). FINDINGS: Status post excision of the RIGHT breast. The radioactive seed and biopsy marker clip are present and completely intact. IMPRESSION: Specimen radiograph of the RIGHT breast. Electronically Signed   By: Margarette Canada M.D.   On: 12/27/2018 10:29   Mm Rt Radioactive Seed Loc Mammo Guide  Result Date: 12/26/2018 CLINICAL DATA:  Patient for preoperative localization prior to right breast lumpectomy. EXAM: MAMMOGRAPHIC GUIDED RADIOACTIVE SEED LOCALIZATION OF THE RIGHT BREAST COMPARISON:  Previous exam(s). FINDINGS: Patient presents for radioactive seed localization prior to right lumpectomy. I met with the patient and we discussed the procedure of seed localization including benefits and alternatives. We discussed the high likelihood of a successful procedure. We discussed the risks of the procedure including  infection, bleeding, tissue injury and further surgery. We discussed the low dose of radioactivity involved in the procedure. Informed, written consent was given. The usual time-out protocol was performed immediately prior to the procedure. Using mammographic guidance, sterile technique, 1% lidocaine and an I-125 radioactive seed, mass within the outer right breast coil clip was localized using a lateral approach. The follow-up mammogram images confirm the seed in the expected location and were marked for Dr. Lucia Gaskins. Follow-up  survey of the patient confirms presence of the radioactive seed. Order number of I-125 seed:  [id number]. Total activity:  9.470 millicuries reference Date: 12/06/2018 The patient tolerated the procedure well and was released from the [facility]. She was given instructions regarding seed removal. IMPRESSION: Radioactive seed localization right breast. No apparent complications. Electronically Signed   By: Lovey Newcomer M.D.   On: 12/26/2018 15:15   Mm Rt Radio Seed Ea Add Lesion Loc Mammo  Result Date: 12/26/2018 CLINICAL DATA:  Patient for preoperative localization prior to right lumpectomy. EXAM: MAMMOGRAPHIC GUIDED RADIOACTIVE SEED LOCALIZATION OF THE RIGHT BREAST COMPARISON:  Previous exam(s). FINDINGS: Patient presents for radioactive seed localization prior to right lumpectomy. I met with the patient and we discussed the procedure of seed localization including benefits and alternatives. We discussed the high likelihood of a successful procedure. We discussed the risks of the procedure including infection, bleeding, tissue injury and further surgery. We discussed the low dose of radioactivity involved in the procedure. Informed, written consent was given. The usual time-out protocol was performed immediately prior to the procedure. Using mammographic guidance, sterile technique, 1% lidocaine and an I-125 radioactive seed, ribbon shaped marking clip was localized using a medial approach. The follow-up mammogram images confirm the seed in the expected location and were marked for Dr. Lucia Gaskins. Follow-up survey of the patient confirms presence of the radioactive seed. Order number of I-125 seed:  [id number]. Total activity:  4.765 millicuries reference Date: 12/12/2018 The patient tolerated the procedure well and was released from the [facility]. She was given instructions regarding seed removal. IMPRESSION: Radioactive seed localization right breast. No apparent complications. Electronically Signed   By:  Lovey Newcomer M.D.   On: 12/26/2018 15:16   [place] Rt Radioactive Seed Ea Add Lesion  Result Date: 12/26/2018 CLINICAL DATA:  Patient for preoperative localization prior to right lymph node removal. EXAM: ULTRASOUND GUIDED RADIOACTIVE SEED LOCALIZATION OF THE RIGHT BREAST COMPARISON:  Previous exam(s). FINDINGS: Patient presents for radioactive seed localization prior to right lymph node removal. I met with the patient and we discussed the procedure of seed localization including benefits and alternatives. We discussed the high likelihood of a successful procedure. We discussed the risks of the procedure including infection, bleeding, tissue injury and further surgery. We discussed the low dose of radioactivity involved in the procedure. Informed, written consent was given. The usual time-out protocol was performed immediately prior to the procedure. Using ultrasound guidance, sterile technique, 1% lidocaine and an I-125 radioactive seed, HydroMARK clip was localized using a lateral approach. The follow-up mammogram images confirm the seed in the expected location and were marked for Dr. Lucia Gaskins. Follow-up survey of the patient confirms presence of the radioactive seed. Order number of I-125 seed:  [id number]. Total activity:  6.812 millicuries reference Date: 12/06/2018 The patient tolerated the procedure well and was released from the [facility]. She was given instructions regarding seed removal. IMPRESSION: Radioactive seed localization right breast. No apparent complications. Electronically Signed   By: Lovey Newcomer M.D.   On:  12/26/2018 15:18   Mm Clip Placement Right  Result Date: 12/26/2018 CLINICAL DATA:  Status post ultrasound-guided seed placement right axilla EXAM: DIAGNOSTIC RIGHT MAMMOGRAM POST ULTRASOUND-GUIDED RADIOACTIVE SEED PLACEMENT COMPARISON:  Previous exam(s). FINDINGS: Mammographic images were obtained following ultrasound-guided radioactive seed placement. These demonstrate radioactive seed  adjacent to the [facility] clip within the right axilla. IMPRESSION: Appropriate location of the radioactive seed. Final Assessment: Post Procedure Mammograms for Seed Placement Electronically Signed   By: Lovey Newcomer M.D.   On: 12/26/2018 15:19    Impression:  Stage IIB (ypT1b, ypN0) Right Breast Multicentric, Invasive Ductal Carcinoma, ER+ / PR- / Her2-, Grade 3 (functionally triple negative)  Patient would be a good candidate for breast conservation therapy directed at the right breast.  In addition I would recommend elective coverage of the axillary region given her significantly enlarged lymph node, biopsy-proven prior to chemotherapy. she would not be a good candidate for hypofractionated treatment in light of her large breast size and coverage of the axillary region. I should mention that she has decided against breast reduction surgery, which was initially planned after her cancer surgery.  Today, I talked to the patient and about the findings and work-up thus far.  We discussed the natural history of breast cancer and general treatment, highlighting the role of radiotherapy in the management.  We discussed the available radiation techniques, and focused on the details of logistics and delivery.  We reviewed the anticipated acute and late sequelae associated with radiation in this setting.  The patient was encouraged to ask questions that I answered to the best of my ability.  A patient consent form was discussed and signed.  We retained a copy for our records.  The patient would like to proceed with radiation and will be scheduled for CT simulation.  Plan: Due to patient's limited range of motion in her right arm/shoulder, we will postpone her CT simulation. She will return on 02/03/2019 for CT simulation with treatments to begin approximately a week later. Anticipate 6.5 weeks of radiation therapy.  First 5 weeks will cover the whole breast and axillary region.    ____________________________________ Gery Pray, MD   This document serves as a record of services personally performed by Gery Pray, MD. It was created on his behalf by Wilburn Mylar, a trained medical scribe. The creation of this record is based on the scribe's personal observations and the provider's statements to them. This document has been checked and approved by the attending provider.

## 2019-01-20 NOTE — Addendum Note (Signed)
Encounter addended by: Gery Pray, MD on: 01/20/2019 8:05 PM  Actions taken: Clinical Note Signed

## 2019-01-20 NOTE — Patient Instructions (Signed)
Coronavirus (COVID-19) Are you at risk?  Are you at risk for the Coronavirus (COVID-19)?  To be considered HIGH RISK for Coronavirus (COVID-19), you have to meet the following criteria:  . Traveled to [place], [place], [place], [place] or [place]; or in the [place] to [place], [place], [place], or [place]; and have fever, cough, and shortness of breath within the last 2 weeks of travel OR . Been in close contact with a person diagnosed with COVID-19 within the last 2 weeks and have fever, cough, and shortness of breath . IF YOU DO NOT MEET THESE CRITERIA, YOU ARE CONSIDERED LOW RISK FOR COVID-19.  What to do if you are HIGH RISK for COVID-19?  . If you are having a medical emergency, call 911. . Seek medical care right away. Before you go to a doctor's office, urgent care or emergency department, call ahead and tell them about your recent travel, contact with someone diagnosed with COVID-19, and your symptoms. You should receive instructions from your physician's office regarding next steps of care.  . When you arrive at healthcare provider, tell the healthcare staff immediately you have returned from visiting [place], [place], [place], [place] or [place]; or traveled in the [place] to [place], [place], [place], or [place]; in the last two weeks or you have been in close contact with a person diagnosed with COVID-19 in the last 2 weeks.   . Tell the health care staff about your symptoms: fever, cough and shortness of breath. . After you have been seen by a medical provider, you will be either: o Tested for (COVID-19) and discharged home on quarantine except to seek medical care if symptoms worsen, and asked to  - Stay home and avoid contact with others until you get your results (4-5 days)  - Avoid travel on public transportation if possible (such as bus, train, or airplane) or o Sent to the Emergency Department by EMS for evaluation, COVID-19 testing, and possible  admission depending on your condition and test results.  What to do if you are LOW RISK for COVID-19?  Reduce your risk of any infection by using the same precautions used for avoiding the common cold or flu:  . Wash your hands often with soap and warm water for at least 20 seconds.  If soap and water are not readily available, use an alcohol-based hand sanitizer with at least 60% alcohol.  . If coughing or sneezing, cover your mouth and nose by coughing or sneezing into the elbow areas of your shirt or coat, into a tissue or into your sleeve (not your hands). . Avoid shaking hands with others and consider head nods or verbal greetings only. . Avoid touching your eyes, nose, or mouth with unwashed hands.  . Avoid close contact with people who are sick. . Avoid places or events with large numbers of people in one location, like concerts or sporting events. . Carefully consider travel plans you have or are making. . If you are planning any travel outside or inside the [place], visit the CDC's Travelers' Health webpage for the latest health notices. . If you have some symptoms but not all symptoms, continue to monitor at home and seek medical attention if your symptoms worsen. . If you are having a medical emergency, call 911.   ADDITIONAL HEALTHCARE OPTIONS FOR PATIENTS  [facility] / e-Visit: [email or web address]         [facility]: [phone]  [facility]: [phone]                   [facility]: [phone]   

## 2019-01-23 ENCOUNTER — Encounter: Payer: Self-pay | Admitting: *Deleted

## 2019-01-31 ENCOUNTER — Encounter: Payer: Self-pay | Admitting: Radiation Oncology

## 2019-01-31 ENCOUNTER — Encounter: Payer: Self-pay | Admitting: Oncology

## 2019-02-03 ENCOUNTER — Ambulatory Visit
Admission: RE | Admit: 2019-02-03 | Discharge: 2019-02-03 | Disposition: A | Discharge status: Home / Self Care | Payer: Managed Care, Other (non HMO) | Source: Ambulatory Visit | Attending: Radiation Oncology | Admitting: Radiation Oncology

## 2019-02-04 ENCOUNTER — Inpatient Hospital Stay: Payer: Managed Care, Other (non HMO) | Admitting: Oncology

## 2019-02-10 ENCOUNTER — Other Ambulatory Visit: Payer: Self-pay

## 2019-02-10 ENCOUNTER — Ambulatory Visit
Admission: RE | Admit: 2019-02-10 | Discharge: 2019-02-10 | Disposition: A | Discharge status: Home / Self Care | Payer: Managed Care, Other (non HMO) | Source: Ambulatory Visit | Attending: Radiation Oncology | Admitting: Radiation Oncology

## 2019-02-10 DIAGNOSIS — Z510 Encounter for antineoplastic radiation therapy: Secondary | ICD-10-CM | POA: Insufficient documentation

## 2019-02-10 DIAGNOSIS — C50811 Malignant neoplasm of overlapping sites of right female breast: Secondary | ICD-10-CM

## 2019-02-10 DIAGNOSIS — Z170 Estrogen receptor positive status [ER+]: Secondary | ICD-10-CM | POA: Diagnosis not present

## 2019-02-10 NOTE — Progress Notes (Signed)
  Radiation Oncology         [phone]) [phone] ________________________________  Name: Autumn Cook MRN: [id number]  Date: 02/10/2019  DOB: 1960/06/10  SIMULATION AND TREATMENT PLANNING NOTE    ICD-10-CM   1. Malignant neoplasm of overlapping sites of right breast in female, estrogen receptor positive ([place])  C50.811    Z17.0     DIAGNOSIS:  Stage IIB (ypT1b, ypN0) Right Breast Multicentric, Invasive Ductal Carcinoma, ER+ / PR- / Her2-, Grade 3  NARRATIVE:  The patient was brought to the [facility].  Identity was confirmed.  All relevant records and images related to the planned course of therapy were reviewed.  The patient freely provided informed written consent to proceed with treatment after reviewing the details related to the planned course of therapy. The consent form was witnessed and verified by the simulation staff.  Then, the patient was set-up in a stable reproducible  supine position for radiation therapy.  CT images were obtained.  Surface markings were placed.  The CT images were loaded into the planning software.  Then the target and avoidance structures were contoured.  Treatment planning then occurred.  The radiation prescription was entered and confirmed.  Then, I designed and supervised the construction of a total of 7 medically necessary complex treatment devices.  I have requested : 3D Simulation  I have requested a DVH of the following structures: heart, lungs, lumpectomy cavities.  I have ordered:dose calc.  PLAN:  The patient will receive 50.4 Gy in 28 fractions directed at the right breast and axillary region.  She will then proceed with a boost to both lumpectomy cavities of 10 Gray in 5 fractions  -----------------------------------  Blair Promise, PhD, MD  This document serves as a record of services personally performed by Gery Pray, MD. It was created on his behalf by Wilburn Mylar, a trained medical scribe. The creation of this record is  based on the scribe's personal observations and the provider's statements to them. This document has been checked and approved by the attending provider.

## 2019-02-13 DIAGNOSIS — Z510 Encounter for antineoplastic radiation therapy: Secondary | ICD-10-CM | POA: Diagnosis not present

## 2019-02-17 ENCOUNTER — Other Ambulatory Visit: Payer: Self-pay

## 2019-02-17 ENCOUNTER — Ambulatory Visit
Admission: RE | Admit: 2019-02-17 | Discharge: 2019-02-17 | Disposition: A | Discharge status: Home / Self Care | Payer: Managed Care, Other (non HMO) | Source: Ambulatory Visit | Attending: Radiation Oncology | Admitting: Radiation Oncology

## 2019-02-17 DIAGNOSIS — Z510 Encounter for antineoplastic radiation therapy: Secondary | ICD-10-CM | POA: Diagnosis not present

## 2019-02-17 DIAGNOSIS — C50811 Malignant neoplasm of overlapping sites of right female breast: Secondary | ICD-10-CM

## 2019-02-17 DIAGNOSIS — Z170 Estrogen receptor positive status [ER+]: Secondary | ICD-10-CM

## 2019-02-17 NOTE — Progress Notes (Signed)
  Radiation Oncology         ([phone]) [phone] ________________________________  Name: Autumn Cook MRN: [id number]  Date: 02/17/2019  DOB: 08-19-1960  Simulation Verification Note    ICD-10-CM   1. Malignant neoplasm of overlapping sites of right breast in female, estrogen receptor positive ([place])  C50.811    Z17.0     Status: outpatient  NARRATIVE: The patient was brought to the treatment unit and placed in the planned treatment position. The clinical setup was verified. Then port films were obtained and uploaded to the radiation oncology medical record software.  The treatment beams were carefully compared against the planned radiation fields. The position location and shape of the radiation fields was reviewed. They targeted volume of tissue appears to be appropriately covered by the radiation beams. Organs at risk appear to be excluded as planned.  Based on my personal review, I approved the simulation verification. The patient's treatment will proceed as planned.  -----------------------------------  Blair Promise, PhD, MD

## 2019-02-18 ENCOUNTER — Ambulatory Visit
Admission: RE | Admit: 2019-02-18 | Discharge: 2019-02-18 | Disposition: A | Discharge status: Home / Self Care | Payer: Managed Care, Other (non HMO) | Source: Ambulatory Visit | Attending: Radiation Oncology | Admitting: Radiation Oncology

## 2019-02-18 ENCOUNTER — Other Ambulatory Visit: Payer: Self-pay

## 2019-02-18 DIAGNOSIS — Z510 Encounter for antineoplastic radiation therapy: Secondary | ICD-10-CM | POA: Diagnosis not present

## 2019-02-18 DIAGNOSIS — C50811 Malignant neoplasm of overlapping sites of right female breast: Secondary | ICD-10-CM

## 2019-02-18 MED ORDER — RADIAPLEXRX EX GEL
Freq: Once | CUTANEOUS | Status: AC
  Administered 2019-02-18: 16:00:00 via TOPICAL

## 2019-02-18 MED ORDER — ALRA NON-METALLIC DEODORANT (RAD-ONC)
1.0000 "application " | Freq: Once | TOPICAL | Status: AC
  Administered 2019-02-18: 1 via TOPICAL

## 2019-02-19 ENCOUNTER — Other Ambulatory Visit: Payer: Self-pay

## 2019-02-19 ENCOUNTER — Ambulatory Visit
Admission: RE | Admit: 2019-02-19 | Discharge: 2019-02-19 | Disposition: A | Discharge status: Home / Self Care | Payer: Managed Care, Other (non HMO) | Source: Ambulatory Visit | Attending: Radiation Oncology | Admitting: Radiation Oncology

## 2019-02-19 DIAGNOSIS — Z510 Encounter for antineoplastic radiation therapy: Secondary | ICD-10-CM | POA: Diagnosis not present

## 2019-02-20 ENCOUNTER — Other Ambulatory Visit: Payer: Self-pay

## 2019-02-20 ENCOUNTER — Ambulatory Visit
Admission: RE | Admit: 2019-02-20 | Discharge: 2019-02-20 | Disposition: A | Discharge status: Home / Self Care | Payer: Managed Care, Other (non HMO) | Source: Ambulatory Visit | Attending: Radiation Oncology | Admitting: Radiation Oncology

## 2019-02-20 DIAGNOSIS — Z510 Encounter for antineoplastic radiation therapy: Secondary | ICD-10-CM | POA: Diagnosis not present

## 2019-02-21 ENCOUNTER — Other Ambulatory Visit: Payer: Self-pay

## 2019-02-21 ENCOUNTER — Ambulatory Visit
Admission: RE | Admit: 2019-02-21 | Discharge: 2019-02-21 | Disposition: A | Discharge status: Home / Self Care | Payer: Managed Care, Other (non HMO) | Source: Ambulatory Visit | Attending: Radiation Oncology | Admitting: Radiation Oncology

## 2019-02-21 DIAGNOSIS — Z510 Encounter for antineoplastic radiation therapy: Secondary | ICD-10-CM | POA: Diagnosis not present

## 2019-02-24 ENCOUNTER — Other Ambulatory Visit: Payer: Self-pay

## 2019-02-24 ENCOUNTER — Encounter: Payer: Self-pay | Admitting: Oncology

## 2019-02-24 ENCOUNTER — Ambulatory Visit
Admission: RE | Admit: 2019-02-24 | Discharge: 2019-02-24 | Disposition: A | Discharge status: Home / Self Care | Payer: Managed Care, Other (non HMO) | Source: Ambulatory Visit | Attending: Radiation Oncology | Admitting: Radiation Oncology

## 2019-02-24 DIAGNOSIS — Z510 Encounter for antineoplastic radiation therapy: Secondary | ICD-10-CM | POA: Diagnosis not present

## 2019-02-25 ENCOUNTER — Ambulatory Visit
Admission: RE | Admit: 2019-02-25 | Discharge: 2019-02-25 | Disposition: A | Discharge status: Home / Self Care | Payer: Managed Care, Other (non HMO) | Source: Ambulatory Visit | Attending: Radiation Oncology | Admitting: Radiation Oncology

## 2019-02-25 ENCOUNTER — Other Ambulatory Visit: Payer: Self-pay

## 2019-02-25 DIAGNOSIS — Z510 Encounter for antineoplastic radiation therapy: Secondary | ICD-10-CM | POA: Diagnosis not present

## 2019-02-26 ENCOUNTER — Other Ambulatory Visit: Payer: Self-pay

## 2019-02-26 ENCOUNTER — Ambulatory Visit
Admission: RE | Admit: 2019-02-26 | Discharge: 2019-02-26 | Disposition: A | Discharge status: Home / Self Care | Payer: Managed Care, Other (non HMO) | Source: Ambulatory Visit | Attending: Radiation Oncology | Admitting: Radiation Oncology

## 2019-02-26 ENCOUNTER — Other Ambulatory Visit: Payer: Self-pay | Admitting: Radiation Oncology

## 2019-02-26 DIAGNOSIS — Z510 Encounter for antineoplastic radiation therapy: Secondary | ICD-10-CM | POA: Diagnosis not present

## 2019-02-26 MED ORDER — PROCHLORPERAZINE MALEATE 10 MG PO TABS: 10.0000 mg | ORAL_TABLET | Freq: Four times a day (QID) | ORAL | 0 refills | Status: DC | PRN

## 2019-02-27 ENCOUNTER — Other Ambulatory Visit: Payer: Self-pay

## 2019-02-27 ENCOUNTER — Ambulatory Visit
Admission: RE | Admit: 2019-02-27 | Discharge: 2019-02-27 | Disposition: A | Discharge status: Home / Self Care | Payer: Managed Care, Other (non HMO) | Source: Ambulatory Visit | Attending: Radiation Oncology | Admitting: Radiation Oncology

## 2019-02-27 DIAGNOSIS — Z510 Encounter for antineoplastic radiation therapy: Secondary | ICD-10-CM | POA: Diagnosis not present

## 2019-02-28 ENCOUNTER — Other Ambulatory Visit: Payer: Self-pay

## 2019-02-28 ENCOUNTER — Ambulatory Visit
Admission: RE | Admit: 2019-02-28 | Discharge: 2019-02-28 | Disposition: A | Discharge status: Home / Self Care | Payer: Managed Care, Other (non HMO) | Source: Ambulatory Visit | Attending: Radiation Oncology | Admitting: Radiation Oncology

## 2019-02-28 DIAGNOSIS — Z510 Encounter for antineoplastic radiation therapy: Secondary | ICD-10-CM | POA: Diagnosis not present

## 2019-03-03 ENCOUNTER — Ambulatory Visit
Admission: RE | Admit: 2019-03-03 | Discharge: 2019-03-03 | Disposition: A | Discharge status: Home / Self Care | Payer: Managed Care, Other (non HMO) | Source: Ambulatory Visit | Attending: Radiation Oncology | Admitting: Radiation Oncology

## 2019-03-03 ENCOUNTER — Other Ambulatory Visit: Payer: Self-pay

## 2019-03-03 DIAGNOSIS — Z510 Encounter for antineoplastic radiation therapy: Secondary | ICD-10-CM | POA: Diagnosis not present

## 2019-03-04 ENCOUNTER — Other Ambulatory Visit: Payer: Self-pay

## 2019-03-04 ENCOUNTER — Ambulatory Visit
Admission: RE | Admit: 2019-03-04 | Discharge: 2019-03-04 | Disposition: A | Discharge status: Home / Self Care | Payer: Managed Care, Other (non HMO) | Source: Ambulatory Visit | Attending: Radiation Oncology | Admitting: Radiation Oncology

## 2019-03-04 DIAGNOSIS — Z510 Encounter for antineoplastic radiation therapy: Secondary | ICD-10-CM | POA: Diagnosis not present

## 2019-03-05 ENCOUNTER — Other Ambulatory Visit: Payer: Self-pay

## 2019-03-05 ENCOUNTER — Ambulatory Visit
Admission: RE | Admit: 2019-03-05 | Discharge: 2019-03-05 | Disposition: A | Discharge status: Home / Self Care | Payer: Managed Care, Other (non HMO) | Source: Ambulatory Visit | Attending: Radiation Oncology | Admitting: Radiation Oncology

## 2019-03-05 DIAGNOSIS — Z510 Encounter for antineoplastic radiation therapy: Secondary | ICD-10-CM | POA: Diagnosis not present

## 2019-03-06 ENCOUNTER — Inpatient Hospital Stay: Payer: Managed Care, Other (non HMO)

## 2019-03-06 ENCOUNTER — Ambulatory Visit
Admission: RE | Admit: 2019-03-06 | Discharge: 2019-03-06 | Disposition: A | Discharge status: Home / Self Care | Payer: Managed Care, Other (non HMO) | Source: Ambulatory Visit | Attending: Radiation Oncology | Admitting: Radiation Oncology

## 2019-03-06 ENCOUNTER — Inpatient Hospital Stay: Payer: Managed Care, Other (non HMO) | Attending: Oncology | Admitting: Adult Health

## 2019-03-06 ENCOUNTER — Encounter: Payer: Self-pay | Admitting: Adult Health

## 2019-03-06 ENCOUNTER — Other Ambulatory Visit: Payer: Self-pay

## 2019-03-06 VITALS — BP 148/75 | HR 81 | Temp 98.0°F | Resp 18 | Ht 66.0 in | Wt 196.6 lb

## 2019-03-06 DIAGNOSIS — T451X5A Adverse effect of antineoplastic and immunosuppressive drugs, initial encounter: Secondary | ICD-10-CM

## 2019-03-06 DIAGNOSIS — Z510 Encounter for antineoplastic radiation therapy: Secondary | ICD-10-CM | POA: Insufficient documentation

## 2019-03-06 DIAGNOSIS — Z170 Estrogen receptor positive status [ER+]: Secondary | ICD-10-CM

## 2019-03-06 DIAGNOSIS — G620 Drug-induced polyneuropathy: Secondary | ICD-10-CM | POA: Diagnosis not present

## 2019-03-06 DIAGNOSIS — C50811 Malignant neoplasm of overlapping sites of right female breast: Secondary | ICD-10-CM | POA: Insufficient documentation

## 2019-03-06 DIAGNOSIS — IMO0001 Reserved for inherently not codable concepts without codable children: Secondary | ICD-10-CM

## 2019-03-06 LAB — CBC WITH DIFFERENTIAL/PLATELET
Abs Immature Granulocytes: 0.03 10*3/uL (ref 0.00–0.07)
Basophils Absolute: 0 10*3/uL (ref 0.0–0.1)
Basophils Relative: 0 %
Eosinophils Absolute: 0.1 10*3/uL (ref 0.0–0.5)
Eosinophils Relative: 2 %
HCT: 37 % (ref 36.0–46.0)
Hemoglobin: 12.7 g/dL (ref 12.0–15.0)
Immature Granulocytes: 1 %
Lymphocytes Relative: 15 %
Lymphs Abs: 0.7 10*3/uL (ref 0.7–4.0)
MCH: 29.5 pg (ref 26.0–34.0)
MCHC: 34.3 g/dL (ref 30.0–36.0)
MCV: 85.8 fL (ref 80.0–100.0)
Monocytes Absolute: 0.4 10*3/uL (ref 0.1–1.0)
Monocytes Relative: 9 %
Neutro Abs: 3.4 10*3/uL (ref 1.7–7.7)
Neutrophils Relative %: 73 %
Platelets: 273 10*3/uL (ref 150–400)
RBC: 4.31 MIL/uL (ref 3.87–5.11)
RDW: 14.1 % (ref 11.5–15.5)
WBC: 4.7 10*3/uL (ref 4.0–10.5)
nRBC: 0 % (ref 0.0–0.2)

## 2019-03-06 LAB — COMPREHENSIVE METABOLIC PANEL
ALT: 15 U/L (ref 0–44)
AST: 16 U/L (ref 15–41)
Albumin: 4 g/dL (ref 3.5–5.0)
Alkaline Phosphatase: 63 U/L (ref 38–126)
Anion gap: 12 (ref 5–15)
BUN: 16 mg/dL (ref 6–20)
CO2: 27 mmol/L (ref 22–32)
Calcium: 10.1 mg/dL (ref 8.9–10.3)
Chloride: 100 mmol/L (ref 98–111)
Creatinine, Ser: 0.93 mg/dL (ref 0.44–1.00)
GFR calc Af Amer: >60 mL/min (ref >60)
GFR calc non Af Amer: >60 mL/min (ref >60)
Glucose, Bld: 245 mg/dL — ABNORMAL HIGH (ref 70–99)
Potassium: 3.8 mmol/L (ref 3.5–5.1)
Sodium: 139 mmol/L (ref 135–145)
Total Bilirubin: 0.3 mg/dL (ref 0.3–1.2)
Total Protein: 7.6 g/dL (ref 6.5–8.1)

## 2019-03-06 NOTE — Patient Instructions (Signed)
Anastrozole tablets What is this medicine? ANASTROZOLE (an AS troe zole) is used to treat breast cancer in women who have gone through menopause. Some types of breast cancer depend on estrogen to grow, and this medicine can stop tumor growth by blocking estrogen production. This medicine may be used for other purposes; ask your health care provider or pharmacist if you have questions. COMMON BRAND NAME(S): Arimidex What should I tell my health care provider before I take this medicine? They need to know if you have any of these conditions:  bone problems  heart disease  high cholesterol  an unusual or allergic reaction to anastrozole, other medicines, foods, dyes, or preservatives  pregnant or trying to get pregnant  breast-feeding How should I use this medicine? Take this medicine by mouth with a glass of water. Follow the directions on the prescription label. You can take it with or without food. If it upsets your stomach, take it with food. Take your medicine at regular intervals. Do not take it more often than directed. Do not stop taking except on your doctor's advice. Talk to your pediatrician regarding the use of this medicine in children. Special care may be needed. Overdosage: If you think you have taken too much of this medicine contact a poison control center or emergency room at once. NOTE: This medicine is only for you. Do not share this medicine with others. What if I miss a dose? If you miss a dose, take it as soon as you can. If it is almost time for your next dose, take only that dose. Do not take double or extra doses. What may interact with this medicine? This medicine may interact with the following medications:  female hormones, like estrogens or progestins and birth control pills, patches, rings, or injections  tamoxifen This list may not describe all possible interactions. Give your health care provider a list of all the medicines, herbs, non-prescription drugs,  or dietary supplements you use. Also tell them if you smoke, drink alcohol, or use illegal drugs. Some items may interact with your medicine. What should I watch for while using this medicine? Visit your doctor or health care professional for regular checks on your progress. Let your doctor or health care professional know about any unusual vaginal bleeding. Do not become pregnant while taking this medicine or for at least 3 weeks after stopping it. Women should inform their doctor if they wish to become pregnant or think they might be pregnant. There is a potential for serious side effects to an unborn child. Talk to your health care professional or pharmacist for more information. Do not breast-feed an infant while taking this medicine or for 2 weeks after stopping it. This medicine may interfere with the ability to have a child. Talk with your doctor or health care professional if you are concerned about your fertility. Using this medicine for a long time may increase your risk of low bone mass. Talk to your doctor about bone health. You should make sure that you get enough calcium and vitamin D while you are taking this medicine. Discuss the foods you eat and the vitamins you take with your health care professional. What side effects may I notice from receiving this medicine? Side effects that you should report to your doctor or health care professional as soon as possible:  allergic reactions like skin rash, itching or hives, swelling of the face, lips, or tongue  signs and symptoms of a blood clot such as breathing problems;   changes in vision; chest pain; sudden headache; pain, swelling, warmth in the leg; trouble speaking; sudden numbness or weakness of the face, arm, or leg  signs and symptoms of infection like fever or chills; cough; sore throat; pain or trouble passing urine Side effects that usually do not require medical attention (report to your doctor or health care professional if they  continue or are bothersome):  bone pain  dizziness  hair loss  headache  hot flashes  joint pain  muscle pain  signs of decreased red blood cells - unusually weak or tired, feeling faint or lightheaded, falls  vaginal discharge, itching, or odor in women This list may not describe all possible side effects. Call your doctor for medical advice about side effects. You may report side effects to FDA at 1-800-FDA-1088. Where should I keep my medicine? Keep out of the reach of children. Store at room temperature between 20 and 25 degrees C (68 and 77 degrees F). Throw away any unused medicine after the expiration date. NOTE: This sheet is a summary. It may not cover all possible information. If you have questions about this medicine, talk to your doctor, pharmacist, or health care provider.  2020 [employer]vier/Gold Standard (2017-05-07 14:56:51) Tamoxifen oral tablet What is this medicine? TAMOXIFEN (ta MOX i fen) blocks the effects of estrogen. It is commonly used to treat breast cancer. It is also used to decrease the chance of breast cancer coming back in women who have received treatment for the disease. It may also help prevent breast cancer in women who have a high risk of developing breast cancer. This medicine may be used for other purposes; ask your health care provider or pharmacist if you have questions. COMMON BRAND NAME(S): Nolvadex What should I tell my health care provider before I take this medicine? They need to know if you have any of these conditions:  blood clots  blood disease  cataracts or impaired eyesight  endometriosis  high calcium levels  high cholesterol  irregular menstrual cycles  liver disease  stroke  uterine fibroids  an unusual reaction to tamoxifen, other medicines, foods, dyes, or preservatives  pregnant or trying to get pregnant  breast-feeding How should I use this medicine? Take this medicine by mouth with a glass of water.  Follow the directions on the prescription label. You can take it with or without food. Take your medicine at regular intervals. Do not take your medicine more often than directed. Do not stop taking except on your doctor's advice. A special MedGuide will be given to you by the pharmacist with each prescription and refill. Be sure to read this information carefully each time. Talk to your pediatrician regarding the use of this medicine in children. While this drug may be prescribed for selected conditions, precautions do apply. Overdosage: If you think you have taken too much of this medicine contact a poison control center or emergency room at once. NOTE: This medicine is only for you. Do not share this medicine with others. What if I miss a dose? If you miss a dose, take it as soon as you can. If it is almost time for your next dose, take only that dose. Do not take double or extra doses. What may interact with this medicine? Do not take this medicine with any of the following medications:  cisapride  certain medicines for irregular heart beat like dronedarone, quinidine  certain medicines for fungal infection like fluconazole, posaconazole  pimozide  saquinavir  thioridazine This medicine  may also interact with the following medications:  aminoglutethimide  anastrozole  bromocriptine  chemotherapy drugs  dofetilide  female hormones, like estrogens and birth control pills  letrozole  medroxyprogesterone  phenobarbital  rifampin  warfarin This list may not describe all possible interactions. Give your health care provider a list of all the medicines, herbs, non-prescription drugs, or dietary supplements you use. Also tell them if you smoke, drink alcohol, or use illegal drugs. Some items may interact with your medicine. What should I watch for while using this medicine? Visit your doctor or health care professional for regular checks on your progress. You will need  regular pelvic exams, breast exams, and mammograms. If you are taking this medicine to reduce your risk of getting breast cancer, you should know that this medicine does not prevent all types of breast cancer. If breast cancer or other problems occur, there is no guarantee that it will be found at an early stage. Do not become pregnant while taking this medicine or for 2 months after stopping it. Women should inform their doctor if they wish to become pregnant or think they might be pregnant. There is a potential for serious side effects to an unborn child. Talk to your health care professional or pharmacist for more information. Do not breast-feed an infant while taking this medicine or for 3 months after stopping it. This medicine may interfere with the ability to have a child. Talk with your doctor or health care professional if you are concerned about your fertility. What side effects may I notice from receiving this medicine? Side effects that you should report to your doctor or health care professional as soon as possible:  allergic reactions like skin rash, itching or hives, swelling of the face, lips, or tongue  changes in vision  changes in your menstrual cycle  difficulty walking or talking  new breast lumps  numbness  pelvic pain or pressure  redness, blistering, peeling or loosening of the skin, including inside the mouth  signs and symptoms of a dangerous change in heartbeat or heart rhythm like chest pain, dizziness, fast or irregular heartbeat, palpitations, feeling faint or lightheaded, falls, breathing problems  sudden chest pain  swelling, pain or tenderness in your calf or leg  unusual bruising or bleeding  vaginal discharge that is bloody, brown, or rust  weakness  yellowing of the whites of the eyes or skin Side effects that usually do not require medical attention (report to your doctor or health care professional if they continue or are  bothersome):  fatigue  hair loss, although uncommon and is usually mild  headache  hot flashes  impotence (in men)  nausea, vomiting (mild)  vaginal discharge (white or clear) This list may not describe all possible side effects. Call your doctor for medical advice about side effects. You may report side effects to FDA at 1-800-FDA-1088. Where should I keep my medicine? Keep out of the reach of children. Store at room temperature between 20 and 25 degrees C (68 and 77 degrees F). Protect from light. Keep container tightly closed. Throw away any unused medicine after the expiration date. NOTE: This sheet is a summary. It may not cover all possible information. If you have questions about this medicine, talk to your doctor, pharmacist, or health care provider.  2020 Elsevier/Gold Standard (2018-04-16 11:15:31)

## 2019-03-06 NOTE — Progress Notes (Signed)
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  She is undergoing adjuvant radiation therapy.  She will finish at the end of November, 2020.  Jayonna is starting to notice some fatigue and nausea from the radiation.  She is experiencing the stabbing pains, but these are not different than what she was told to expect.  She is applying the cream to the area.    REVIEW OF SYSTEMS: Shernell is otherwise feeling well.  She has no fever or chills.  She is without chest pain, palpitations, cough, or shortness of breath.  She has no nausea, vomiting, bowel/bladder changes.  A detailed ROS was otherwise non contributory.    HISTORY OF CURRENT ILLNESS: From the original intake note:  LERONDA LEWERS had routine screening mammography on 07/01/2018 showing a possible abnormality in the right breast; she notes that did feel some pain and a lump in her breast leading up to the routine screening. She underwent right breast ultrasonography at [facility] on 07/05/2018 showing: A 1.4 x 1.2 x 1.4 cm oval hypoechoic mass at the 5:30 position 8 cm from the nipple. A 0.9 x 1 x 1 cm slightly irregular hypoechoic mass at the 10:30 position 9 cm from the nipple. A 3  x 1.9 x 3 cm probable lymph node without fatty hilum in the LOWER RIGHT axilla. A RIGHT axillary lymph node with slightly thickened cortex.  Accordingly on 07/10/2018 she proceeded to biopsy of the right breast area in question. The pathology from this procedure showed ([id number]): invasive ductal carcinoma, high grade, 5:30 o'clock. Prognostic indicators significant for: estrogen receptor, 30% positive with weak staining intensity and progesterone receptor, 0% negative. Proliferation marker Ki67 at 70%.   An additional biopsy was performed on the same day ([id number]) showing:  2. Breast, right, needle core biopsy, 10 o'clock - Invasive carcinoma  Finally, an additional biopsy was performed at the right axilla on the same day ([id number]) showing: invasive carcinoma, right axilla. Prognostic indicators significant for: estrogen receptor, 70% positive, with weak staining intensity and progesterone receptor, 0% negative. Proliferation marker Ki67 at 70%. HER2 positive by immunohistochemistry.    The patient's subsequent history is as detailed below.    PAST MEDICAL HISTORY: Past Medical History:  Diagnosis Date   Allergy    Arthritis    Asthma    Cancer ([place])    R breast cancer   Diabetes mellitus without complication ([place])    no meds now   Family history of adverse reaction to anesthesia    "entire family takes a long time to wake up- N&V for several days"   Family history of lung cancer    Hypertension    Migraine    PONV (postoperative nausea and vomiting)      PAST SURGICAL HISTORY: Past Surgical History:  Procedure Laterality Date   BREAST CYST EXCISION  1994   BREAST LUMPECTOMY WITH RADIOACTIVE SEED AND SENTINEL LYMPH NODE BIOPSY Right 12/27/2018   Procedure: RIGHT BREAST LUMPECTOMY WITH RADIOACTIVE SEED X2 AND RIGHT SEED TARGETED AXILLARY SENTINEL LYMPH NODE BIOPSY;  Surgeon: Alphonsa Overall, MD;  Location: [facility];  Service: General;  Laterality:  Right;   DILATION AND CURETTAGE OF UTERUS     PORTACATH PLACEMENT Right 08/01/2018   Procedure: INSERTION PORT-A-CATH WITH ULTRASOUND;  Surgeon: Alphonsa Overall, MD;  Location: [facility];  Service: General;  Laterality: Right;     FAMILY HISTORY: Family History  Problem Relation Age of Onset   Hypertension Father    Cancer Father 1       lung cancer   Stroke Father    Heart disease Mother        arrhythmia   Hypertension Mother    Kidney failure Sister    Darolyn's father died from lung cancer at age 9; he was a heavy smoker. Patients' mother is 39 as of 07/2018. The patient has 3 brothers and 2 sisters. Patient denies anyone in her family having breast, ovarian, prostate, or pancreatic cancer. Mechele's maternal great great uncle was diagnosed with lung cancer.     GYNECOLOGIC HISTORY:  Patient's last menstrual period was 11/14/2013. 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 Her husband, [name], is a Administrator. At home with them is two toy poodles. Khalie has 4 children, [name], Eppie Gibson, and [name]. [name] is 74, lives in [place], and works in Science writer. [name] is 39, lives in [place], and works in Engineer, technical sales. [name] is 24, is taking care of Wynetta, and works as a Control and instrumentation engineer. [name] is 29, lives in [place], and is a Buyer, retail.   ADVANCED DIRECTIVES: In the absence of any documentation, Shauntel's spouse, [name], is her healthcare power of attorney.     HEALTH MAINTENANCE: Social History   Tobacco Use   Smoking status: Former Smoker    Packs/day: 1.00    Years: 5.00    Pack years: 5.00    Quit date: 05/08/1988    Years since quitting: 30.8   Smokeless tobacco: Never Used  Substance Use Topics    Alcohol use: Yes    Comment: social   Drug use: No    Colonoscopy: yes, 2010 - due 2020.  PAP: 2017  Bone density: yes, 2015, normal Mammography: 07/01/2018   Allergies  Allergen Reactions   Penicillins Hives    Did it involve swelling of the face/tongue/throat, SOB, or low BP? Yes--syncope episode Did it involve sudden or severe rash/hives, skin peeling, or any reaction on the inside of your mouth or nose? No Did you need to seek medical attention at a hospital or doctor's office? Yes When did it last happen? 37-38 YEARS AGO If all above answers are "NO", may proceed with cephalosporin use.    Metformin And Related Diarrhea   Adhesive [Tape] Other (See Comments)    "severe bruising"  ALSO SKIN GLUE   Asa [Aspirin] Other (See Comments)    GI upset     Current Outpatient Medications  Medication Sig Dispense Refill   albuterol (VENTOLIN HFA) 108 (90 Base) MCG/ACT inhaler Inhale 2 puffs into the lungs every 4 (four) hours as  needed for wheezing or shortness of breath (cough, shortness of breath or wheezing.). 1 Inhaler 1   amLODipine (NORVASC) 5 MG tablet Take 1 tablet (5 mg total) by mouth daily. Start with 2.5 mg (Patient taking differently: Take 5 mg by mouth daily. Start with 2.5 mg) 90 tablet 3   atenolol (TENORMIN) 100 MG tablet Take 1 tablet (100 mg total) by mouth daily. 90 tablet 1   blood glucose meter kit and supplies KIT Dispense based on patient and insurance preference. Use up to four times daily as directed. (FOR ICD-9 250.00, 250.01). 1 each 0   glucose blood (ACCU-CHEK ACTIVE STRIPS) test strip Use as instructed- check blood sugar up to 2x a day 100 each 12   lidocaine-prilocaine (EMLA) cream Apply to affected area once 30 g 3   lisinopril-hydrochlorothiazide (ZESTORETIC) 20-12.5 MG tablet Take 2 tablets by mouth daily.     potassium chloride SA (K-DUR) 20 MEQ tablet Take 1 tablet (20 mEq total) by mouth daily. 90 tablet 4   prochlorperazine  (COMPAZINE) 10 MG tablet Take 1 tablet (10 mg total) by mouth every 6 (six) hours as needed for nausea or vomiting. 30 tablet 0   Vitamin D, Ergocalciferol, (DRISDOL) 1.25 MG (50000 UT) CAPS capsule TAKE 1 CAPSULE BY MOUTH EVERY 7 DAYS (Patient taking differently: Take 50,000 Units by mouth every Saturday. ) 12 capsule 1   No current facility-administered medications for this visit.      OBJECTIVE:   Vitals:   03/06/19 1115  BP: (!) 148/75  Pulse: 81  Resp: 18  Temp: 98 F (36.7 C)  SpO2: 100%     Body mass index is 31.73 kg/m.   Wt Readings from Last 3 Encounters:  03/06/19 196 lb 9.6 oz (89.2 kg)  01/20/19 193 lb 8 oz (87.8 kg)  01/06/19 193 lb 12.8 oz (87.9 kg)    ECOG FS:1 - Symptomatic but completely ambulatory GENERAL: Patient is a well appearing female in no acute distress HEENT:  Sclerae anicteric.  Oropharynx clear and moist. No ulcerations or evidence of oropharyngeal candidiasis. Neck is supple.  NODES:  No cervical, supraclavicular, or axillary lymphadenopathy palpated.  BREAST EXAM: right breast currently undergoing radiation, no peeling noted.  Left breast benign. LUNGS:  Clear to auscultation bilaterally.  No wheezes or rhonchi. HEART:  Regular rate and rhythm. No murmur appreciated. ABDOMEN:  Soft, nontender.  Positive, normoactive bowel sounds. No organomegaly palpated. MSK:  No focal spinal tenderness to palpation. Full range of motion bilaterally in the upper extremities. EXTREMITIES:  No peripheral edema.   SKIN:  Clear with no obvious rashes or skin changes. No nail dyscrasia. NEURO:  Nonfocal. Well oriented.  Appropriate affect.    LAB RESULTS:  CMP     Component Value Date/Time   NA 139 03/06/2019 1049   K 3.8 03/06/2019 1049   CL 100 03/06/2019 1049   CO2 27 03/06/2019 1049   GLUCOSE 245 (H) 03/06/2019 1049   BUN 16 03/06/2019 1049   CREATININE 0.93 03/06/2019 1049   CREATININE 0.72 10/08/2018 1000   CREATININE 0.70 04/14/2015 1558    CALCIUM 10.1 03/06/2019 1049   PROT 7.6 03/06/2019 1049   ALBUMIN 4.0 03/06/2019 1049   AST 16 03/06/2019 1049   AST 17 10/08/2018 1000   ALT 15 03/06/2019 1049   ALT 23 10/08/2018 1000   ALKPHOS 63 03/06/2019 1049   BILITOT 0.3 03/06/2019 1049   BILITOT <0.2 (L) 10/08/2018 1000   GFRNONAA >60 03/06/2019 1049  GFRNONAA >60 10/08/2018 1000   GFRNONAA 87 03/27/2014 1130   GFRAA >60 03/06/2019 1049   GFRAA >60 10/08/2018 1000   GFRAA >89 03/27/2014 1130    No results found for: TOTALPROTELP, ALBUMINELP, A1GS, A2GS, BETS, BETA2SER, GAMS, MSPIKE, SPEI  No results found for: KPAFRELGTCHN, LAMBDASER, [facility]  Lab Results  Component Value Date   WBC 4.7 03/06/2019   NEUTROABS 3.4 03/06/2019   HGB 12.7 03/06/2019   HCT 37.0 03/06/2019   MCV 85.8 03/06/2019   PLT 273 03/06/2019    @LASTCHEMISTRY @  No results found for: LABCA2  No components found for: [id number]  No results for input(s): INR in the last 168 hours.  No results found for: LABCA2  No results found for: TDH741  No results found for: ULA453  No results found for: MIW803  No results found for: CA2729  No components found for: HGQUANT  No results found for: CEA1 / No results found for: CEA1   No results found for: AFPTUMOR  No results found for: [place]  No results found for: PSA1  Appointment on 03/06/2019  Component Date Value Ref Range Status   WBC 03/06/2019 4.7  4.0 - 10.5 K/uL Final   RBC 03/06/2019 4.31  3.87 - 5.11 MIL/uL Final   Hemoglobin 03/06/2019 12.7  12.0 - 15.0 g/dL Final   HCT 03/06/2019 37.0  36.0 - 46.0 % Final   MCV 03/06/2019 85.8  80.0 - 100.0 fL Final   MCH 03/06/2019 29.5  26.0 - 34.0 pg Final   MCHC 03/06/2019 34.3  30.0 - 36.0 g/dL Final   RDW 03/06/2019 14.1  11.5 - 15.5 % Final   Platelets 03/06/2019 273  150 - 400 K/uL Final   nRBC 03/06/2019 0.0  0.0 - 0.2 % Final   Neutrophils Relative % 03/06/2019 73  % Final   Neutro Abs 03/06/2019 3.4  1.7 -  7.7 K/uL Final   Lymphocytes Relative 03/06/2019 15  % Final   Lymphs Abs 03/06/2019 0.7  0.7 - 4.0 K/uL Final   Monocytes Relative 03/06/2019 9  % Final   Monocytes Absolute 03/06/2019 0.4  0.1 - 1.0 K/uL Final   Eosinophils Relative 03/06/2019 2  % Final   Eosinophils Absolute 03/06/2019 0.1  0.0 - 0.5 K/uL Final   Basophils Relative 03/06/2019 0  % Final   Basophils Absolute 03/06/2019 0.0  0.0 - 0.1 K/uL Final   Immature Granulocytes 03/06/2019 1  % Final   Abs Immature Granulocytes 03/06/2019 0.03  0.00 - 0.07 K/uL Final   Performed at [facility], [place] [address] [place], [place] [address]   Sodium 03/06/2019 139  135 - 145 mmol/L Final   Potassium 03/06/2019 3.8  3.5 - 5.1 mmol/L Final   Chloride 03/06/2019 100  98 - 111 mmol/L Final   CO2 03/06/2019 27  22 - 32 mmol/L Final   Glucose, Bld 03/06/2019 245* 70 - 99 mg/dL Final   BUN 03/06/2019 16  6 - 20 mg/dL Final   Creatinine, Ser 03/06/2019 0.93  0.44 - 1.00 mg/dL Final   Calcium 03/06/2019 10.1  8.9 - 10.3 mg/dL Final   Total Protein 03/06/2019 7.6  6.5 - 8.1 g/dL Final   Albumin 03/06/2019 4.0  3.5 - 5.0 g/dL Final   AST 03/06/2019 16  15 - 41 U/L Final   ALT 03/06/2019 15  0 - 44 U/L Final   Alkaline Phosphatase 03/06/2019 63  38 - 126 U/L Final   Total Bilirubin 03/06/2019  0.3  0.3 - 1.2 mg/dL Final   GFR calc non Af Amer 03/06/2019 >60  >60 mL/min Final   GFR calc Af Amer 03/06/2019 >60  >60 mL/min Final   Anion gap 03/06/2019 12  5 - 15 Final   Performed at [facility], [place] [address] [place], [place] [address]    (this displays the last labs from the last 3 days)  No results found for: TOTALPROTELP, ALBUMINELP, A1GS, A2GS, BETS, BETA2SER, GAMS, MSPIKE, SPEI (this displays SPEP labs)  No results found for: KPAFRELGTCHN, LAMBDASER, KAPLAMBRATIO (kappa/lambda light chains)  No results found for: HGBA, HGBA2QUANT, HGBFQUANT,  HGBSQUAN (Hemoglobinopathy evaluation)   No results found for: LDH  No results found for: IRON, TIBC, IRONPCTSAT (Iron and TIBC)  No results found for: FERRITIN  Urinalysis    Component Value Date/Time   COLORURINE YELLOW 03/07/2016 1350   APPEARANCEUR CLEAR 03/07/2016 1350   LABSPEC 1.031 (H) 03/07/2016 1350   PHURINE 5.0 03/07/2016 1350   GLUCOSEU >1000 (A) 03/07/2016 1350   HGBUR NEGATIVE 03/07/2016 1350   BILIRUBINUR NEGATIVE 03/07/2016 1350   BILIRUBINUR neg 11/24/2013 1547   KETONESUR 15 (A) 03/07/2016 1350   PROTEINUR NEGATIVE 03/07/2016 1350   UROBILINOGEN 0.2 11/24/2013 1547   UROBILINOGEN 0.2 11/10/2006 1153   NITRITE NEGATIVE 03/07/2016 [place] 03/07/2016 1350     STUDIES:  No results found.   ELIGIBLE FOR AVAILABLE RESEARCH PROTOCOL: No   ASSESSMENT: 58 y.o. , [place] woman status post right breast biopsy x2 for multicentric invasive ductal carcinoma, clinically T1c N1, stage IIB, grade 3, functionally triple negative, with an MIB-1 of 70%  (a) right axillary lymph node biopsied at the same time was positive  (1) neoadjuvant chemotherapy will consist of doxorubicin and cyclophosphamide in dose dense fashion x4 followed by weekly Abraxane and carboplatin x12 starting 08/07/2018, stopped after 2 cycles (last dose on 10/15/2018) due to peripheral neuropathy   (2) Right breast lumpectomy on 12/27/2018 shows a ypT1b ypN0 residual invasive ductal carcinoma, margins negative.  (a) 5 sentinel nodes were negative.   (b) Estrogen receptor 75% positive, weak, Progesterone receptor negative,   (3) adjuvant radiation to finish end of 03/2019  (4) genetics testing 08/06/2018 through the Breast + GYN Cancers Panel offered by [facility] found no deleterious mutations in ATM, BARD1, BRCA1, BRCA2, BRIP1, CDH1, CHEK2, DICER1, EPCAM, MLH1, MSH2, MSH6, NBN, NF1, PALB2, PMS2, PTEN, RAD50, RAD51C, RAD51D, SMARCA4, STK11, TP53.   (5) breast reconstruction to  follow  (6) LMP 5 years ago naturally  PLAN: Cayenne is doing well today.  She is tolerating her radiation well and she is continuing this through the end of November.  After Dewaine Oats completes her radiation, she will be ready to start anti estrogen therapy.  We discussed that she will get some benefit from anti estrogen therapy because her tumor was weakly 75% estrogen positive.  I reviewed the difference between Anastrozole and Tamoxifen.  I also gave Marica information on both medications to review.    Maleea is having some balance issues related to her peripheral neuropathy.  I placed a referral for her to see PT about this.    Logen will return in 6 weeks for labs and f/u with [name] to further discuss anti estrogen therapy.  She was recommended to continue with the appropriate pandemic precautions. She knows to call for any questions that may arise between now and her next appointment.  We are happy to see her sooner if  needed.   A total of (30) minutes of face-to-face time was spent with this patient with greater than 50% of that time in counseling and care-coordination.   Wilber Bihari, NP  03/06/19 11:52 AM Medical Oncology and Hematology [facility] [address] [place], [place] [address] Tel. [phone]    Fax. [phone]

## 2019-03-07 ENCOUNTER — Other Ambulatory Visit: Payer: Self-pay

## 2019-03-07 ENCOUNTER — Telehealth: Payer: Self-pay | Admitting: Adult Health

## 2019-03-07 ENCOUNTER — Ambulatory Visit
Admission: RE | Admit: 2019-03-07 | Discharge: 2019-03-07 | Disposition: A | Discharge status: Home / Self Care | Payer: Managed Care, Other (non HMO) | Source: Ambulatory Visit | Attending: Radiation Oncology | Admitting: Radiation Oncology

## 2019-03-07 DIAGNOSIS — Z510 Encounter for antineoplastic radiation therapy: Secondary | ICD-10-CM | POA: Diagnosis not present

## 2019-03-07 NOTE — Telephone Encounter (Signed)
I talk with patient regarding schedule  

## 2019-03-08 ENCOUNTER — Other Ambulatory Visit: Payer: Self-pay | Admitting: Nurse Practitioner

## 2019-03-10 ENCOUNTER — Ambulatory Visit
Admission: RE | Admit: 2019-03-10 | Discharge: 2019-03-10 | Disposition: A | Discharge status: Home / Self Care | Payer: Managed Care, Other (non HMO) | Source: Ambulatory Visit | Attending: Radiation Oncology | Admitting: Radiation Oncology

## 2019-03-10 ENCOUNTER — Other Ambulatory Visit: Payer: Self-pay

## 2019-03-10 DIAGNOSIS — Z510 Encounter for antineoplastic radiation therapy: Secondary | ICD-10-CM | POA: Insufficient documentation

## 2019-03-10 DIAGNOSIS — Z170 Estrogen receptor positive status [ER+]: Secondary | ICD-10-CM | POA: Insufficient documentation

## 2019-03-10 DIAGNOSIS — C50811 Malignant neoplasm of overlapping sites of right female breast: Secondary | ICD-10-CM | POA: Insufficient documentation

## 2019-03-11 ENCOUNTER — Other Ambulatory Visit: Payer: Self-pay

## 2019-03-11 ENCOUNTER — Ambulatory Visit
Admission: RE | Admit: 2019-03-11 | Discharge: 2019-03-11 | Disposition: A | Discharge status: Home / Self Care | Payer: Managed Care, Other (non HMO) | Source: Ambulatory Visit | Attending: Radiation Oncology | Admitting: Radiation Oncology

## 2019-03-11 DIAGNOSIS — C50811 Malignant neoplasm of overlapping sites of right female breast: Secondary | ICD-10-CM | POA: Diagnosis not present

## 2019-03-12 ENCOUNTER — Ambulatory Visit
Admission: RE | Admit: 2019-03-12 | Discharge: 2019-03-12 | Disposition: A | Discharge status: Home / Self Care | Payer: Managed Care, Other (non HMO) | Source: Ambulatory Visit | Attending: Radiation Oncology | Admitting: Radiation Oncology

## 2019-03-12 DIAGNOSIS — C50811 Malignant neoplasm of overlapping sites of right female breast: Secondary | ICD-10-CM | POA: Diagnosis not present

## 2019-03-13 ENCOUNTER — Ambulatory Visit
Admission: RE | Admit: 2019-03-13 | Discharge: 2019-03-13 | Disposition: A | Discharge status: Home / Self Care | Payer: Managed Care, Other (non HMO) | Source: Ambulatory Visit | Attending: Radiation Oncology | Admitting: Radiation Oncology

## 2019-03-13 ENCOUNTER — Other Ambulatory Visit: Payer: Self-pay

## 2019-03-13 DIAGNOSIS — C50811 Malignant neoplasm of overlapping sites of right female breast: Secondary | ICD-10-CM | POA: Diagnosis not present

## 2019-03-14 ENCOUNTER — Ambulatory Visit
Admission: RE | Admit: 2019-03-14 | Discharge: 2019-03-14 | Disposition: A | Discharge status: Home / Self Care | Payer: Managed Care, Other (non HMO) | Source: Ambulatory Visit | Attending: Radiation Oncology | Admitting: Radiation Oncology

## 2019-03-14 ENCOUNTER — Other Ambulatory Visit: Payer: Self-pay

## 2019-03-14 DIAGNOSIS — C50811 Malignant neoplasm of overlapping sites of right female breast: Secondary | ICD-10-CM | POA: Diagnosis not present

## 2019-03-17 ENCOUNTER — Other Ambulatory Visit: Payer: Self-pay

## 2019-03-17 ENCOUNTER — Ambulatory Visit
Admission: RE | Admit: 2019-03-17 | Discharge: 2019-03-17 | Disposition: A | Discharge status: Home / Self Care | Payer: Managed Care, Other (non HMO) | Source: Ambulatory Visit | Attending: Radiation Oncology | Admitting: Radiation Oncology

## 2019-03-17 DIAGNOSIS — C50811 Malignant neoplasm of overlapping sites of right female breast: Secondary | ICD-10-CM | POA: Diagnosis not present

## 2019-03-18 ENCOUNTER — Other Ambulatory Visit: Payer: Self-pay

## 2019-03-18 ENCOUNTER — Ambulatory Visit
Admission: RE | Admit: 2019-03-18 | Discharge: 2019-03-18 | Disposition: A | Discharge status: Home / Self Care | Payer: Managed Care, Other (non HMO) | Source: Ambulatory Visit | Attending: Radiation Oncology | Admitting: Radiation Oncology

## 2019-03-18 DIAGNOSIS — C50811 Malignant neoplasm of overlapping sites of right female breast: Secondary | ICD-10-CM | POA: Diagnosis not present

## 2019-03-19 ENCOUNTER — Ambulatory Visit
Admission: RE | Admit: 2019-03-19 | Discharge: 2019-03-19 | Disposition: A | Discharge status: Home / Self Care | Payer: Managed Care, Other (non HMO) | Source: Ambulatory Visit | Attending: Radiation Oncology | Admitting: Radiation Oncology

## 2019-03-19 ENCOUNTER — Other Ambulatory Visit: Payer: Self-pay

## 2019-03-19 DIAGNOSIS — C50811 Malignant neoplasm of overlapping sites of right female breast: Secondary | ICD-10-CM | POA: Diagnosis not present

## 2019-03-20 ENCOUNTER — Ambulatory Visit
Admission: RE | Admit: 2019-03-20 | Discharge: 2019-03-20 | Disposition: A | Discharge status: Home / Self Care | Payer: Managed Care, Other (non HMO) | Source: Ambulatory Visit | Attending: Radiation Oncology | Admitting: Radiation Oncology

## 2019-03-20 ENCOUNTER — Other Ambulatory Visit: Payer: Self-pay

## 2019-03-20 DIAGNOSIS — C50811 Malignant neoplasm of overlapping sites of right female breast: Secondary | ICD-10-CM | POA: Diagnosis not present

## 2019-03-21 ENCOUNTER — Ambulatory Visit
Admission: RE | Admit: 2019-03-21 | Discharge: 2019-03-21 | Disposition: A | Discharge status: Home / Self Care | Payer: Managed Care, Other (non HMO) | Source: Ambulatory Visit | Attending: Radiation Oncology | Admitting: Radiation Oncology

## 2019-03-21 ENCOUNTER — Other Ambulatory Visit: Payer: Self-pay

## 2019-03-21 DIAGNOSIS — C50811 Malignant neoplasm of overlapping sites of right female breast: Secondary | ICD-10-CM | POA: Diagnosis not present

## 2019-03-24 ENCOUNTER — Ambulatory Visit
Admission: RE | Admit: 2019-03-24 | Discharge: 2019-03-24 | Disposition: A | Discharge status: Home / Self Care | Payer: Managed Care, Other (non HMO) | Source: Ambulatory Visit | Attending: Radiation Oncology | Admitting: Radiation Oncology

## 2019-03-24 ENCOUNTER — Other Ambulatory Visit: Payer: Self-pay

## 2019-03-24 DIAGNOSIS — C50811 Malignant neoplasm of overlapping sites of right female breast: Secondary | ICD-10-CM | POA: Diagnosis not present

## 2019-03-25 ENCOUNTER — Ambulatory Visit
Admission: RE | Admit: 2019-03-25 | Discharge: 2019-03-25 | Disposition: A | Discharge status: Home / Self Care | Payer: Managed Care, Other (non HMO) | Source: Ambulatory Visit | Attending: Radiation Oncology | Admitting: Radiation Oncology

## 2019-03-25 ENCOUNTER — Ambulatory Visit: Payer: Managed Care, Other (non HMO) | Admitting: Radiation Oncology

## 2019-03-25 ENCOUNTER — Other Ambulatory Visit: Payer: Self-pay

## 2019-03-25 DIAGNOSIS — C50811 Malignant neoplasm of overlapping sites of right female breast: Secondary | ICD-10-CM | POA: Diagnosis not present

## 2019-03-26 ENCOUNTER — Other Ambulatory Visit: Payer: Self-pay

## 2019-03-26 ENCOUNTER — Ambulatory Visit
Admission: RE | Admit: 2019-03-26 | Discharge: 2019-03-26 | Disposition: A | Discharge status: Home / Self Care | Payer: Managed Care, Other (non HMO) | Source: Ambulatory Visit | Attending: Radiation Oncology | Admitting: Radiation Oncology

## 2019-03-26 DIAGNOSIS — C50811 Malignant neoplasm of overlapping sites of right female breast: Secondary | ICD-10-CM | POA: Diagnosis not present

## 2019-03-27 ENCOUNTER — Other Ambulatory Visit: Payer: Self-pay

## 2019-03-27 ENCOUNTER — Ambulatory Visit
Admission: RE | Admit: 2019-03-27 | Discharge: 2019-03-27 | Disposition: A | Discharge status: Home / Self Care | Payer: Managed Care, Other (non HMO) | Source: Ambulatory Visit | Attending: Radiation Oncology | Admitting: Radiation Oncology

## 2019-03-27 DIAGNOSIS — C50811 Malignant neoplasm of overlapping sites of right female breast: Secondary | ICD-10-CM | POA: Diagnosis not present

## 2019-03-28 ENCOUNTER — Other Ambulatory Visit: Payer: Self-pay

## 2019-03-28 ENCOUNTER — Ambulatory Visit
Admission: RE | Admit: 2019-03-28 | Discharge: 2019-03-28 | Disposition: A | Discharge status: Home / Self Care | Payer: Managed Care, Other (non HMO) | Source: Ambulatory Visit | Attending: Radiation Oncology | Admitting: Radiation Oncology

## 2019-03-28 DIAGNOSIS — C50811 Malignant neoplasm of overlapping sites of right female breast: Secondary | ICD-10-CM | POA: Diagnosis not present

## 2019-03-30 ENCOUNTER — Ambulatory Visit
Admission: RE | Admit: 2019-03-30 | Discharge: 2019-03-30 | Disposition: A | Discharge status: Home / Self Care | Payer: Managed Care, Other (non HMO) | Source: Ambulatory Visit | Attending: Radiation Oncology | Admitting: Radiation Oncology

## 2019-03-30 ENCOUNTER — Other Ambulatory Visit: Payer: Self-pay

## 2019-03-30 DIAGNOSIS — C50811 Malignant neoplasm of overlapping sites of right female breast: Secondary | ICD-10-CM | POA: Diagnosis not present

## 2019-03-31 ENCOUNTER — Ambulatory Visit
Admission: RE | Admit: 2019-03-31 | Discharge: 2019-03-31 | Disposition: A | Discharge status: Home / Self Care | Payer: Managed Care, Other (non HMO) | Source: Ambulatory Visit | Attending: Radiation Oncology | Admitting: Radiation Oncology

## 2019-03-31 ENCOUNTER — Other Ambulatory Visit: Payer: Self-pay

## 2019-03-31 DIAGNOSIS — C50811 Malignant neoplasm of overlapping sites of right female breast: Secondary | ICD-10-CM | POA: Diagnosis not present

## 2019-04-01 ENCOUNTER — Other Ambulatory Visit: Payer: Self-pay

## 2019-04-01 ENCOUNTER — Ambulatory Visit
Admission: RE | Admit: 2019-04-01 | Discharge: 2019-04-01 | Disposition: A | Discharge status: Home / Self Care | Payer: Managed Care, Other (non HMO) | Source: Ambulatory Visit | Attending: Radiation Oncology | Admitting: Radiation Oncology

## 2019-04-01 DIAGNOSIS — Z170 Estrogen receptor positive status [ER+]: Secondary | ICD-10-CM

## 2019-04-01 DIAGNOSIS — C50811 Malignant neoplasm of overlapping sites of right female breast: Secondary | ICD-10-CM

## 2019-04-01 MED ORDER — SILVER SULFADIAZINE 1 % EX CREA
TOPICAL_CREAM | Freq: Two times a day (BID) | CUTANEOUS | Status: DC
  Administered 2019-04-01: 16:00:00 via TOPICAL

## 2019-04-02 ENCOUNTER — Ambulatory Visit
Admission: RE | Admit: 2019-04-02 | Discharge: 2019-04-02 | Disposition: A | Discharge status: Home / Self Care | Payer: Managed Care, Other (non HMO) | Source: Ambulatory Visit | Attending: Radiation Oncology | Admitting: Radiation Oncology

## 2019-04-02 ENCOUNTER — Encounter: Payer: Self-pay | Admitting: Radiation Oncology

## 2019-04-02 ENCOUNTER — Other Ambulatory Visit: Payer: Self-pay

## 2019-04-02 ENCOUNTER — Ambulatory Visit: Payer: Managed Care, Other (non HMO)

## 2019-04-02 DIAGNOSIS — C50811 Malignant neoplasm of overlapping sites of right female breast: Secondary | ICD-10-CM | POA: Diagnosis not present

## 2019-04-04 ENCOUNTER — Encounter: Payer: Self-pay | Admitting: *Deleted

## 2019-04-07 ENCOUNTER — Ambulatory Visit: Payer: Managed Care, Other (non HMO)

## 2019-04-09 NOTE — Progress Notes (Incomplete)
Patient Name: Autumn Cook MRN: [id number] DOB: 08/31/1960 Referring Physician: Lurline Del (Profile Not Attached) Date of Service: 04/02/2019 [facility], [place]                                                        End Of Treatment Note  Diagnoses: C50.811-Malignant neoplasm of overlapping sites of right female breast  Cancer Staging: Stage IIB (ypT1b, ypN0) Right Breast Multicentric, Invasive Ductal Carcinoma, ER+ / PR- / Her2-, Grade 3  Intent: Curative  Radiation Treatment Dates: 02/18/2019 through 04/02/2019 Site Technique Total Dose (Gy) Dose per Fx (Gy) Completed Fx Beam Energies  Breast: Breast_Rt 3D 50.4/50.4 1.8 28/28 6X, 10X  Breast: Breast_Rt_axilla 3D 50.4/50.4 1.8 28/28 6X, 10X  Breast: Breast_Rt_Bst 3D 20/20 2 10/10 6X, 10X   Narrative: The patient tolerated radiation therapy relatively well. Patient reported moderate to severe fatigue throughout. She also reported sharp pains in right breast with associated swelling and hyperpigementation. Towards the end of treatment, patient had some moist desquamation and skin breakdown beneath right breast. She was given Silvadene.  Plan: The patient will follow-up with radiation oncology in one month.  ________________________________________________   Blair Promise, PhD, MD  This document serves as a record of services personally performed by Gery Pray, MD. It was created on his behalf by Clerance Lav, a trained medical scribe. The creation of this record is based on the scribe's personal observations and the provider's statements to them. This document has been checked and approved by the attending provider.

## 2019-04-16 ENCOUNTER — Encounter: Payer: Self-pay | Admitting: Family Medicine

## 2019-04-16 ENCOUNTER — Ambulatory Visit (INDEPENDENT_AMBULATORY_CARE_PROVIDER_SITE_OTHER): Payer: Managed Care, Other (non HMO) | Admitting: Family Medicine

## 2019-04-16 ENCOUNTER — Other Ambulatory Visit: Payer: Self-pay

## 2019-04-16 DIAGNOSIS — E669 Obesity, unspecified: Secondary | ICD-10-CM | POA: Diagnosis not present

## 2019-04-16 DIAGNOSIS — E1169 Type 2 diabetes mellitus with other specified complication: Secondary | ICD-10-CM

## 2019-04-16 DIAGNOSIS — E559 Vitamin D deficiency, unspecified: Secondary | ICD-10-CM | POA: Diagnosis not present

## 2019-04-16 MED ORDER — GLIPIZIDE 5 MG PO TABS: ORAL_TABLET | ORAL | 5 refills | Status: DC

## 2019-04-16 MED ORDER — VITAMIN D (ERGOCALCIFEROL) 1.25 MG (50000 UNIT) PO CAPS: ORAL_CAPSULE | ORAL | 1 refills | Status: DC

## 2019-04-16 NOTE — Progress Notes (Signed)
[place] [facility] [address], [place], [place] [address] [phone]  Date:  04/16/2019   Name:  Autumn Cook   DOB:  1961-01-13   MRN:  [id number]  PCP:  Darreld Mclean, MD    Chief Complaint: No chief complaint on file.   History of Present Illness:  Autumn Cook is a 58 y.o. very pleasant female patient who presents with the following:  Patient with history of breast cancer, diabetes, hyperlipidemia, hypertension, overweight Virtual visit today for diabetes follow-up She was diagnosed with breast cancer in February of this year Married, with 4 adult children  At her last visit she was having problems with HPYOglycemia him on a low-dose of glipizide.  Due to excellent A1c we had her stop this medication at that time  Lab Results  Component Value Date   HGBA1C 5.7 12/02/2018   Amlodipine Atenolol Albuterol as needed Lisinopril HCTZ Potassium 20 mEq  She completed her radiation about 2 weeks ago- she is glad to be done  Flu shot- not done yet, will do soon Eye exam-deferred due to high blood sugar  She notes that as she stopped her radiation she was very tired, and she started consuming slightly more sweets She is using some honey which is supposed to help her immune system- she is taking just a tsp twice a day however so would not expect this to significantly increase her blood sugar She wonders if this may be due to exercising less-prior to the weather getting cold she was walking for 30 to 40 minutes a day.  She has really stopped doing this since he got cold She has noted her glucose running higher - up to about 250 or 300 She is checking several times a day- even her fasting may be 280s She stopped the honey  She does not tolerated metformin in the past  She has not recently used any steroids  Patient Active Problem List   Diagnosis Date Noted  . Genetic testing 08/13/2018  . Port-A-Cath in  place 08/07/2018  . Family history of lung cancer   . Malignant neoplasm of overlapping sites of right breast in female, estrogen receptor positive ([place]) 07/23/2018  . Uncontrolled diabetes mellitus type 2 without complications 69/62/9528  . Achilles tendonitis, bilateral 06/11/2015  . Chest pain with moderate risk for cardiac etiology 10/13/2014  . Hypercholesterolemia 10/13/2014  . Idiopathic scoliosis 08/18/2014  . Hypertension 09/11/2012  . Overweight (BMI 25.0-29.9) 09/11/2012  . Migraine headache 09/11/2012  . Hx of thyroid nodule 09/11/2012  . History of tobacco use 09/11/2012    Past Medical History:  Diagnosis Date  . Allergy   . Arthritis   . Asthma   . Cancer [facility])    R breast cancer  . Diabetes mellitus without complication (HCC)    no meds now  . Family history of adverse reaction to anesthesia    "entire family takes a long time to wake up- N&V for several days"  . Family history of lung cancer   . Hypertension   . Migraine   . PONV (postoperative nausea and vomiting)     Past Surgical History:  Procedure Laterality Date  . BREAST CYST EXCISION  1994  . BREAST LUMPECTOMY WITH RADIOACTIVE SEED AND SENTINEL LYMPH NODE BIOPSY Right 12/27/2018   Procedure: RIGHT BREAST LUMPECTOMY WITH RADIOACTIVE SEED X2 AND RIGHT SEED TARGETED AXILLARY SENTINEL LYMPH NODE BIOPSY;  Surgeon: Alphonsa Overall, MD;  Location: [place];  Service: General;  Laterality: Right;  . DILATION AND CURETTAGE OF UTERUS    . PORTACATH PLACEMENT Right 08/01/2018   Procedure: INSERTION PORT-A-CATH WITH ULTRASOUND;  Surgeon: Alphonsa Overall, MD;  Location: [place];  Service: General;  Laterality: Right;    Social History   Tobacco Use  . Smoking status: Former Smoker    Packs/day: 1.00    Years: 5.00    Pack years: 5.00    Quit date: 05/08/1988    Years since quitting: 30.9  . Smokeless tobacco: Never Used  Substance Use Topics  . Alcohol use: Yes    Comment:  social  . Drug use: No    Family History  Problem Relation Age of Onset  . Hypertension Father   . Cancer Father 84       lung cancer  . Stroke Father   . Heart disease Mother        arrhythmia  . Hypertension Mother   . Kidney failure Sister     Allergies  Allergen Reactions  . Penicillins Hives    Did it involve swelling of the face/tongue/throat, SOB, or low BP? Yes--syncope episode Did it involve sudden or severe rash/hives, skin peeling, or any reaction on the inside of your mouth or nose? No Did you need to seek medical attention at a hospital or doctor's office? Yes When did it last happen? 37-38 YEARS AGO If all above answers are "NO", may proceed with cephalosporin use.   . Metformin And Related Diarrhea  . Adhesive [Tape] Other (See Comments)    "severe bruising"  ALSO SKIN GLUE  . Asa [Aspirin] Other (See Comments)    GI upset     Medication list has been reviewed and updated.  Current Outpatient Medications on File Prior to Visit  Medication Sig Dispense Refill  . albuterol (VENTOLIN HFA) 108 (90 Base) MCG/ACT inhaler Inhale 2 puffs into the lungs every 4 (four) hours as needed for wheezing or shortness of breath (cough, shortness of breath or wheezing.). 1 Inhaler 1  . amLODipine (NORVASC) 5 MG tablet Take 1 tablet (5 mg total) by mouth daily. Start with 2.5 mg (Patient taking differently: Take 5 mg by mouth daily. Start with 2.5 mg) 90 tablet 3  . atenolol (TENORMIN) 100 MG tablet Take 1 tablet (100 mg total) by mouth daily. 90 tablet 1  . blood glucose meter kit and supplies KIT Dispense based on patient and insurance preference. Use up to four times daily as directed. (FOR ICD-9 250.00, 250.01). 1 each 0  . glucose blood (ACCU-CHEK ACTIVE STRIPS) test strip Use as instructed- check blood sugar up to 2x a day 100 each 12  . lisinopril-hydrochlorothiazide (ZESTORETIC) 20-12.5 MG tablet Take 2 tablets by mouth daily.    . potassium chloride SA (K-DUR)  20 MEQ tablet Take 1 tablet (20 mEq total) by mouth daily. 90 tablet 4  . prochlorperazine (COMPAZINE) 10 MG tablet Take 1 tablet (10 mg total) by mouth every 6 (six) hours as needed for nausea or vomiting. 30 tablet 0  . Vitamin D, Ergocalciferol, (DRISDOL) 1.25 MG (50000 UT) CAPS capsule TAKE 1 CAPSULE BY MOUTH EVERY 7 DAYS (Patient taking differently: Take 50,000 Units by mouth every Saturday. ) 12 capsule 1   No current facility-administered medications on file prior to visit.     Review of Systems:  As per HPI- otherwise negative.   Physical Examination: There were no vitals filed for this visit.  There were no vitals filed for this visit. There is no height or weight on file to calculate BMI. Ideal Body Weight:    Spoke with patient on telephone She sounds well, no cough, wheezing, distress is noted  Assessment and Plan: Diabetes mellitus type 2 in obese ([place]) - Plan: glipiZIDE (GLUCOTROL) 5 MG tablet  Vitamin D deficiency - Plan: Vitamin D, Ergocalciferol, (DRISDOL) 1.25 MG (50000 UT) CAPS capsule  Virtual visit today with concern of increased blood sugar.  I suspect that it is her change in exercise habits that have made a difference.  It is dark and cold outside right now, we discussed strategies to allow her to still exercise such as dressing in layers and using a headlamp for safety She plans to get back into her exercise routine We will have her start back on glipizide-start with 5 mg daily, may increase to 5 twice daily after a few days assuming no low blood sugars May increase further if needed, she will keep me posted as to her progress Spoke to pt for about 10:45 today  Signed Lamar Blinks, MD

## 2019-04-17 ENCOUNTER — Inpatient Hospital Stay: Payer: Managed Care, Other (non HMO)

## 2019-04-17 ENCOUNTER — Other Ambulatory Visit: Payer: Self-pay

## 2019-04-17 ENCOUNTER — Inpatient Hospital Stay: Payer: Managed Care, Other (non HMO) | Attending: Oncology | Admitting: Adult Health

## 2019-04-17 ENCOUNTER — Telehealth: Payer: Self-pay | Admitting: Oncology

## 2019-04-17 VITALS — BP 146/90 | HR 76 | Temp 98.2°F | Resp 17 | Ht 66.0 in | Wt 192.6 lb

## 2019-04-17 DIAGNOSIS — M129 Arthropathy, unspecified: Secondary | ICD-10-CM | POA: Insufficient documentation

## 2019-04-17 DIAGNOSIS — I10 Essential (primary) hypertension: Secondary | ICD-10-CM | POA: Diagnosis not present

## 2019-04-17 DIAGNOSIS — IMO0001 Reserved for inherently not codable concepts without codable children: Secondary | ICD-10-CM

## 2019-04-17 DIAGNOSIS — C50811 Malignant neoplasm of overlapping sites of right female breast: Secondary | ICD-10-CM

## 2019-04-17 DIAGNOSIS — Z87891 Personal history of nicotine dependence: Secondary | ICD-10-CM | POA: Insufficient documentation

## 2019-04-17 DIAGNOSIS — J45909 Unspecified asthma, uncomplicated: Secondary | ICD-10-CM | POA: Diagnosis not present

## 2019-04-17 DIAGNOSIS — Z923 Personal history of irradiation: Secondary | ICD-10-CM | POA: Insufficient documentation

## 2019-04-17 DIAGNOSIS — Z170 Estrogen receptor positive status [ER+]: Secondary | ICD-10-CM | POA: Diagnosis not present

## 2019-04-17 DIAGNOSIS — E119 Type 2 diabetes mellitus without complications: Secondary | ICD-10-CM | POA: Insufficient documentation

## 2019-04-17 DIAGNOSIS — C50911 Malignant neoplasm of unspecified site of right female breast: Secondary | ICD-10-CM | POA: Insufficient documentation

## 2019-04-17 DIAGNOSIS — Z79899 Other long term (current) drug therapy: Secondary | ICD-10-CM | POA: Insufficient documentation

## 2019-04-17 LAB — CBC WITH DIFFERENTIAL/PLATELET
Abs Immature Granulocytes: 0.03 10*3/uL (ref 0.00–0.07)
Basophils Absolute: 0 10*3/uL (ref 0.0–0.1)
Basophils Relative: 1 %
Eosinophils Absolute: 0.1 10*3/uL (ref 0.0–0.5)
Eosinophils Relative: 3 %
HCT: 38.6 % (ref 36.0–46.0)
Hemoglobin: 13.1 g/dL (ref 12.0–15.0)
Immature Granulocytes: 1 %
Lymphocytes Relative: 18 %
Lymphs Abs: 0.7 10*3/uL (ref 0.7–4.0)
MCH: 30.2 pg (ref 26.0–34.0)
MCHC: 33.9 g/dL (ref 30.0–36.0)
MCV: 88.9 fL (ref 80.0–100.0)
Monocytes Absolute: 0.5 10*3/uL (ref 0.1–1.0)
Monocytes Relative: 12 %
Neutro Abs: 2.7 10*3/uL (ref 1.7–7.7)
Neutrophils Relative %: 65 %
Platelets: 250 10*3/uL (ref 150–400)
RBC: 4.34 MIL/uL (ref 3.87–5.11)
RDW: 13.9 % (ref 11.5–15.5)
WBC: 4.1 10*3/uL (ref 4.0–10.5)
nRBC: 0 % (ref 0.0–0.2)

## 2019-04-17 LAB — COMPREHENSIVE METABOLIC PANEL
ALT: 19 U/L (ref 0–44)
AST: 17 U/L (ref 15–41)
Albumin: 4 g/dL (ref 3.5–5.0)
Alkaline Phosphatase: 71 U/L (ref 38–126)
Anion gap: 11 (ref 5–15)
BUN: 15 mg/dL (ref 6–20)
CO2: 29 mmol/L (ref 22–32)
Calcium: 9.9 mg/dL (ref 8.9–10.3)
Chloride: 100 mmol/L (ref 98–111)
Creatinine, Ser: 1.18 mg/dL — ABNORMAL HIGH (ref 0.44–1.00)
GFR calc Af Amer: 60 mL/min — ABNORMAL LOW (ref >60)
GFR calc non Af Amer: 52 mL/min — ABNORMAL LOW (ref >60)
Glucose, Bld: 271 mg/dL — ABNORMAL HIGH (ref 70–99)
Potassium: 3.9 mmol/L (ref 3.5–5.1)
Sodium: 140 mmol/L (ref 135–145)
Total Bilirubin: 0.3 mg/dL (ref 0.3–1.2)
Total Protein: 7.7 g/dL (ref 6.5–8.1)

## 2019-04-17 NOTE — Telephone Encounter (Signed)
I left a message regarding schedule  

## 2019-04-17 NOTE — Progress Notes (Signed)
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  She notes her skin is healing, and she is recovering from the treatments.  She continues to apply lotion to her breast.    Autumn Cook states she is not yet ready to start anti estrogent herpay.  Should would prefer to revisit this topic in January.  She would like to have some more time to heal from radiation, as well as to focus on her port removal.    REVIEW OF SYSTEMS: Autumn Cook denies any new issues like fever, chills, chest pain palpitations, cough, shortness of breath, nausea, vomiting, bowel/bladder changes, headaches, or any other concerns.  A detailed ROS was otherwise non contributory.    HISTORY OF CURRENT ILLNESS: From the original intake note:  Autumn Cook had routine screening mammography on 07/01/2018 showing a possible abnormality in the right breast; she notes that did feel some pain and a lump in her breast leading up to the routine screening. She underwent right breast ultrasonography at [facility] on 07/05/2018 showing: A 1.4 x 1.2 x 1.4 cm oval hypoechoic mass at the 5:30 position 8 cm from the nipple. A 0.9 x 1 x 1 cm slightly  irregular hypoechoic mass at the 10:30 position 9 cm from the nipple. A 3 x 1.9 x 3 cm probable lymph node without fatty hilum in the LOWER RIGHT axilla. A RIGHT axillary lymph node with slightly thickened cortex.  Accordingly on 07/10/2018 she proceeded to biopsy of the right breast area in question. The pathology from this procedure showed ([id number]): invasive ductal carcinoma, high grade, 5:30 o'clock. Prognostic indicators significant for: estrogen receptor, 30% positive with weak staining intensity and progesterone receptor, 0% negative. Proliferation marker Ki67 at 70%.   An additional biopsy was performed on the same day ([id number]) showing:  2. Breast, right, needle core biopsy, 10 o'clock - Invasive carcinoma  Finally, an additional biopsy was performed at the right axilla on the same day ([id number]) showing: invasive carcinoma, right axilla. Prognostic indicators significant for: estrogen receptor, 70% positive, with weak staining intensity and progesterone receptor, 0% negative. Proliferation marker Ki67 at 70%. HER2 positive by immunohistochemistry.    The patient's subsequent history is as detailed below.    PAST MEDICAL HISTORY: Past Medical History:  Diagnosis Date  . Allergy   . Arthritis   . Asthma   . Cancer [facility])    R breast cancer  . Diabetes mellitus without complication (HCC)    no meds now  . Family history of adverse reaction to anesthesia    "entire family takes a long time to wake up- N&V for several days"  . Family history of lung cancer   . Hypertension   . Migraine   . PONV (postoperative nausea and vomiting)      PAST SURGICAL HISTORY: Past  Surgical History:  Procedure Laterality Date  . BREAST CYST EXCISION  1994  . BREAST LUMPECTOMY WITH RADIOACTIVE SEED AND SENTINEL LYMPH NODE BIOPSY Right 12/27/2018   Procedure: RIGHT BREAST LUMPECTOMY WITH RADIOACTIVE SEED X2 AND RIGHT SEED TARGETED AXILLARY SENTINEL LYMPH NODE BIOPSY;  Surgeon: Autumn Overall,  MD;  Location: [place];  Service: General;  Laterality: Right;  . DILATION AND CURETTAGE OF UTERUS    . PORTACATH PLACEMENT Right 08/01/2018   Procedure: INSERTION PORT-A-CATH WITH ULTRASOUND;  Surgeon: Autumn Overall, MD;  Location: [place];  Service: General;  Laterality: Right;     FAMILY HISTORY: Family History  Problem Relation Age of Onset  . Hypertension Father   . Cancer Father 73       lung cancer  . Stroke Father   . Heart disease Mother        arrhythmia  . Hypertension Mother   . Kidney failure Sister    Autumn Cook's father died from lung cancer at age 34; he was a heavy smoker. Patients' mother is 72 as of 07/2018. The patient has 3 brothers and 2 sisters. Patient denies anyone in her family having breast, ovarian, prostate, or pancreatic cancer. Autumn Cook's maternal great great uncle was diagnosed with lung cancer.     GYNECOLOGIC HISTORY:  Patient's last menstrual period was 11/14/2013. 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 Her husband, Autumn Cook, is a Administrator. At home with them is two toy poodles. Autumn Cook has 4 children, Autumn Cook, Autumn Cook, and Autumn Cook. Autumn Cook is 50, lives in [place], and works in Science writer. Autumn Cook is 32, lives in [place], and works in Engineer, technical sales. Autumn Cook is 63, is taking care of Autumn Cook, and works as a Control and instrumentation engineer. Autumn Cook is 5, lives in [place], and is a Buyer, retail.   ADVANCED DIRECTIVES: In the absence of any documentation, Autumn Cook's spouse, Autumn Cook, is her healthcare power of attorney.     HEALTH MAINTENANCE: Social History   Tobacco Use  . Smoking status: Former Smoker    Packs/day: 1.00    Years: 5.00    Pack years: 5.00    Quit date: 05/08/1988    Years since  quitting: 30.9  . Smokeless tobacco: Never Used  Substance Use Topics  . Alcohol use: Yes    Comment: social  . Drug use: No    Colonoscopy: yes, 2010 - due 2020.  PAP: 2017  Bone density: yes, 2015, normal Mammography: 07/01/2018   Allergies  Allergen Reactions  . Penicillins Hives    Did it involve swelling of the face/tongue/throat, SOB, or low BP? Yes--syncope episode Did it involve sudden or severe rash/hives, skin peeling, or any reaction on the inside of your mouth or nose? No Did you need to seek medical attention at a hospital or doctor's office? Yes When did it last happen? 37-38 YEARS AGO If all above answers are "NO", may proceed with cephalosporin use.   . Metformin And Related Diarrhea  . Adhesive [Tape] Other (See Comments)    "severe bruising"  ALSO SKIN GLUE  . Asa [Aspirin] Other (See Comments)    GI upset     Current Outpatient Medications  Medication Sig Dispense Refill  . albuterol (VENTOLIN HFA) 108 (90 Base) MCG/ACT  inhaler Inhale 2 puffs into the lungs every 4 (four) hours as needed for wheezing or shortness of breath (cough, shortness of breath or wheezing.). 1 Inhaler 1  . amLODipine (NORVASC) 5 MG tablet Take 1 tablet (5 mg total) by mouth daily. Start with 2.5 mg (Patient taking differently: Take 5 mg by mouth daily. Start with 2.5 mg) 90 tablet 3  . atenolol (TENORMIN) 100 MG tablet Take 1 tablet (100 mg total) by mouth daily. 90 tablet 1  . blood glucose meter kit and supplies KIT Dispense based on patient and insurance preference. Use up to four times daily as directed. (FOR ICD-9 250.00, 250.01). 1 each 0  . glipiZIDE (GLUCOTROL) 5 MG tablet Start with 5 mg once a day, then increase to twice a day after 1 week.  Continue to increase as directed by MD 120 tablet 5  . glucose blood (ACCU-CHEK ACTIVE STRIPS) test strip Use as instructed- check blood sugar up to 2x a day 100 each 12  . lisinopril-hydrochlorothiazide (ZESTORETIC) 20-12.5 MG  tablet Take 2 tablets by mouth daily.    . potassium chloride SA (K-DUR) 20 MEQ tablet Take 1 tablet (20 mEq total) by mouth daily. 90 tablet 4  . prochlorperazine (COMPAZINE) 10 MG tablet Take 1 tablet (10 mg total) by mouth every 6 (six) hours as needed for nausea or vomiting. 30 tablet 0  . Vitamin D, Ergocalciferol, (DRISDOL) 1.25 MG (50000 UT) CAPS capsule TAKE 1 CAPSULE BY MOUTH EVERY 7 DAYS 12 capsule 1   No current facility-administered medications for this visit.     OBJECTIVE:   Vitals:   04/17/19 1119  BP: (!) 146/90  Pulse: 76  Resp: 17  Temp: 98.2 F (36.8 C)  SpO2: 100%     Body mass index is 31.09 kg/m.   Wt Readings from Last 3 Encounters:  04/17/19 192 lb 9.6 oz (87.4 kg)  03/06/19 196 lb 9.6 oz (89.2 kg)  01/20/19 193 lb 8 oz (87.8 kg)    ECOG FS:1 - Symptomatic but completely ambulatory GENERAL: Patient is a well appearing female in no acute distress HEENT:  Sclerae anicteric.  Oropharynx clear and moist. No ulcerations or evidence of oropharyngeal candidiasis. Neck is supple.  NODES:  No cervical, supraclavicular, or axillary lymphadenopathy palpated.  BREAST EXAM: right breast s/p lumpectomy and radiation, skin inspected only and peeling and swelling noted, no sign of infection  Left breast benign. LUNGS:  Clear to auscultation bilaterally.  No wheezes or rhonchi. HEART:  Regular rate and rhythm. No murmur appreciated. ABDOMEN:  Soft, nontender.  Positive, normoactive bowel sounds. No organomegaly palpated. MSK:  No focal spinal tenderness to palpation. Full range of motion bilaterally in the upper extremities. EXTREMITIES:  No peripheral edema.   SKIN:  Clear with no obvious rashes or skin changes. No nail dyscrasia. NEURO:  Nonfocal. Well oriented.  Appropriate affect.    LAB RESULTS:  CMP     Component Value Date/Time   NA 139 03/06/2019 1049   K 3.8 03/06/2019 1049   CL 100 03/06/2019 1049   CO2 27 03/06/2019 1049   GLUCOSE 245 (H) 03/06/2019  1049   BUN 16 03/06/2019 1049   CREATININE 0.93 03/06/2019 1049   CREATININE 0.72 10/08/2018 1000   CREATININE 0.70 04/14/2015 1558   CALCIUM 10.1 03/06/2019 1049   PROT 7.6 03/06/2019 1049   ALBUMIN 4.0 03/06/2019 1049   AST 16 03/06/2019 1049   AST 17 10/08/2018 1000   ALT 15 03/06/2019 1049  ALT 23 10/08/2018 1000   ALKPHOS 63 03/06/2019 1049   BILITOT 0.3 03/06/2019 1049   BILITOT <0.2 (L) 10/08/2018 1000   GFRNONAA >60 03/06/2019 1049   GFRNONAA >60 10/08/2018 1000   GFRNONAA 87 03/27/2014 1130   GFRAA >60 03/06/2019 1049   GFRAA >60 10/08/2018 1000   GFRAA >89 03/27/2014 1130    No results found for: TOTALPROTELP, ALBUMINELP, A1GS, A2GS, BETS, BETA2SER, GAMS, MSPIKE, SPEI  No results found for: KPAFRELGTCHN, LAMBDASER, KAPLAMBRATIO  Lab Results  Component Value Date   WBC 4.1 04/17/2019   NEUTROABS 2.7 04/17/2019   HGB 13.1 04/17/2019   HCT 38.6 04/17/2019   MCV 88.9 04/17/2019   PLT 250 04/17/2019    @LASTCHEMISTRY @  No results found for: LABCA2  No components found for: QGBEEF007  No results for input(s): INR in the last 168 hours.  No results found for: LABCA2  No results found for: HQR975  No results found for: OIT254  No results found for: DIY641  No results found for: CA2729  No components found for: HGQUANT  No results found for: CEA1 / No results found for: CEA1   No results found for: AFPTUMOR  No results found for: CHROMOGRNA  No results found for: PSA1  Appointment on 04/17/2019  Component Date Value Ref Range Status  . WBC 04/17/2019 4.1  4.0 - 10.5 K/uL Final  . RBC 04/17/2019 4.34  3.87 - 5.11 MIL/uL Final  . Hemoglobin 04/17/2019 13.1  12.0 - 15.0 g/dL Final  . HCT 04/17/2019 38.6  36.0 - 46.0 % Final  . MCV 04/17/2019 88.9  80.0 - 100.0 fL Final  . MCH 04/17/2019 30.2  26.0 - 34.0 pg Final  . MCHC 04/17/2019 33.9  30.0 - 36.0 g/dL Final  . RDW 04/17/2019 13.9  11.5 - 15.5 % Final  . Platelets 04/17/2019 250  150 - 400  K/uL Final  . nRBC 04/17/2019 0.0  0.0 - 0.2 % Final  . Neutrophils Relative % 04/17/2019 65  % Final  . Neutro Abs 04/17/2019 2.7  1.7 - 7.7 K/uL Final  . Lymphocytes Relative 04/17/2019 18  % Final  . Lymphs Abs 04/17/2019 0.7  0.7 - 4.0 K/uL Final  . Monocytes Relative 04/17/2019 12  % Final  . Monocytes Absolute 04/17/2019 0.5  0.1 - 1.0 K/uL Final  . Eosinophils Relative 04/17/2019 3  % Final  . Eosinophils Absolute 04/17/2019 0.1  0.0 - 0.5 K/uL Final  . Basophils Relative 04/17/2019 1  % Final  . Basophils Absolute 04/17/2019 0.0  0.0 - 0.1 K/uL Final  . Immature Granulocytes 04/17/2019 1  % Final  . Abs Immature Granulocytes 04/17/2019 0.03  0.00 - 0.07 K/uL Final   Performed at [facility], [address] [place], [place] [address]    (this displays the last labs from the last 3 days)  No results found for: TOTALPROTELP, ALBUMINELP, A1GS, A2GS, BETS, BETA2SER, GAMS, MSPIKE, SPEI (this displays SPEP labs)  No results found for: KPAFRELGTCHN, LAMBDASER, KAPLAMBRATIO (kappa/lambda light chains)  No results found for: HGBA, HGBA2QUANT, HGBFQUANT, HGBSQUAN (Hemoglobinopathy evaluation)   No results found for: LDH  No results found for: IRON, TIBC, IRONPCTSAT (Iron and TIBC)  No results found for: FERRITIN  Urinalysis    Component Value Date/Time   COLORURINE YELLOW 03/07/2016 [place] 03/07/2016 1350   LABSPEC 1.031 (H) 03/07/2016 1350   PHURINE 5.0 03/07/2016 1350   GLUCOSEU >1000 (A) 03/07/2016 1350   HGBUR  NEGATIVE 03/07/2016 1350   BILIRUBINUR NEGATIVE 03/07/2016 1350   BILIRUBINUR neg 11/24/2013 1547   KETONESUR 15 (A) 03/07/2016 1350   PROTEINUR NEGATIVE 03/07/2016 1350   UROBILINOGEN 0.2 11/24/2013 1547   UROBILINOGEN 0.2 11/10/2006 1153   NITRITE NEGATIVE 03/07/2016 1350   LEUKOCYTESUR NEGATIVE 03/07/2016 1350     STUDIES:  No results found.   ELIGIBLE FOR AVAILABLE RESEARCH PROTOCOL: No   ASSESSMENT:  58 y.o. [place], [place] woman status post right breast biopsy x2 for multicentric invasive ductal carcinoma, clinically T1c N1, stage IIB, grade 3, functionally triple negative, with an MIB-1 of 70%  (a) right axillary lymph node biopsied at the same time was positive  (1) neoadjuvant chemotherapy will consist of doxorubicin and cyclophosphamide in dose dense fashion x4 followed by weekly Abraxane and carboplatin x12 starting 08/07/2018, stopped after 2 cycles (last dose on 10/15/2018) due to peripheral neuropathy   (2) Right breast lumpectomy on 12/27/2018 shows a ypT1b ypN0 residual invasive ductal carcinoma, margins negative.  (a) 5 sentinel nodes were negative.   (b) Estrogen receptor 75% positive, weak, Progesterone receptor negative.  (3) adjuvant radiation completed in 03/2019  (4) genetics testing 08/06/2018 through the Breast + GYN Cancers Panel offered by [facility] found no deleterious mutations in ATM, BARD1, BRCA1, BRCA2, BRIP1, CDH1, CHEK2, DICER1, EPCAM, MLH1, MSH2, MSH6, NBN, NF1, PALB2, PMS2, PTEN, RAD50, RAD51C, RAD51D, SMARCA4, STK11, TP53.   (5) breast reconstruction to follow  (6) LMP 5 years ago naturally  PLAN: Khaleelah is feeling moderately well.  We discussed that at her last appointment we talked about anti estrogen therapy.  She has not yet made a decision and would like to hold off on this until January, which is reasonable.    We did discuss why we recommend that she take the anti estrogen therapy and how it could be beneficial.    Angline will proceed with her port removal later this month.  She will see [name] in January for f/u.  She was recommended to continue with the appropriate pandemic precautions. She knows to call for any questions that may arise between now and her next appointment.  We are happy to see her sooner if needed.   A total of (15) minutes of face-to-face time was spent with this patient with greater than 50% of that time in counseling and  care-coordination.   Wilber Bihari, NP  04/17/19 11:22 AM Medical Oncology and Hematology [facility] [address] [place], [place] [address] Tel. [phone]    Fax. [phone]

## 2019-04-18 ENCOUNTER — Encounter: Payer: Self-pay | Admitting: Adult Health

## 2019-04-23 ENCOUNTER — Telehealth: Payer: Self-pay | Admitting: *Deleted

## 2019-04-23 ENCOUNTER — Encounter: Payer: Self-pay | Admitting: Oncology

## 2019-04-23 NOTE — Telephone Encounter (Signed)
This RN called pt per her mychart message stating bleeding from anus.  Per discussion- Autumn Cook states she woke this AM and " when I went to the bathroom it just gushed "  She has since applied pressure with some benefit.  She states she has history of hemorrhoids " but always managed them myself " .  She states " 2 days ago - I had a hemorrhoid pop out that was really hard "  This RN discussed above including need to see surgeon for above if bleeding not stopped.  This RN contacted Dr Pollie Friar office per above- discussed with triage nurse who will speak with MD and contact pt.  Pt made aware of above as well as to continue to apply pressure and she may find sitting on ice pack may be helpful as well.  [place] verbalized understanding.

## 2019-04-29 ENCOUNTER — Telehealth: Payer: Self-pay | Admitting: *Deleted

## 2019-04-29 NOTE — Telephone Encounter (Signed)
CALLED PATIENT TO ALTER FU ON 05-08-19 PER JILL, RN FOR DR. KINARD, RESCHEDULED FOR 05-22-19 @ 9:15 AM, LVM FOR A RETURN CALL

## 2019-05-08 ENCOUNTER — Encounter: Payer: Self-pay | Admitting: *Deleted

## 2019-05-08 ENCOUNTER — Ambulatory Visit: Payer: Managed Care, Other (non HMO) | Admitting: Radiation Oncology

## 2019-05-14 NOTE — Progress Notes (Signed)
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  Since her last visit here she had her port removed.  That went well and she had no unusual side effects.  She is ready to discuss antiestrogens today    REVIEW OF SYSTEMS: Cataleah had a quiet Christmas.  She did do some cooking.  After she had her port removed she became a little bit more active but she still not exercising regularly.  She has moved to a different neighborhood in [facility] and she is not yet sure where it is safe for her to walk.  She did lose her balance 1 time and fell.  She wonders if her peripheral neuropathy may be the reason for that.  She does know where her feet are though.  She does not have significant problems with her hands.  A detailed review of systems today was otherwise stable   HISTORY OF CURRENT ILLNESS: From the original intake note:  LATOIA EYSTER had routine screening mammography on 07/01/2018 showing a possible abnormality in the right breast; she notes that did feel some pain and a lump in her breast leading up to the routine screening. She underwent right breast ultrasonography at [facility] on 07/05/2018 showing: A 1.4 x 1.2 x 1.4 cm oval hypoechoic mass at the 5:30  position 8 cm from the nipple. A 0.9 x 1 x 1 cm slightly irregular hypoechoic mass at the 10:30 position 9 cm from the nipple. A 3 x 1.9 x 3 cm probable lymph node without fatty hilum in the LOWER RIGHT axilla. A RIGHT axillary lymph node with slightly thickened cortex.  Accordingly on 07/10/2018 she proceeded to biopsy of the right breast area in question. The pathology from this procedure showed ([id number]): invasive ductal carcinoma, high grade, 5:30 o'clock. Prognostic indicators significant for: estrogen receptor, 30% positive with weak staining intensity and progesterone receptor, 0% negative. Proliferation marker Ki67 at 70%.   An additional biopsy was performed on the same day ([id number]) showing:  2. Breast, right, needle core biopsy, 10 o'clock - Invasive carcinoma  Finally, an additional biopsy was performed at the right axilla on the same day ([id number]) showing: invasive carcinoma, right axilla. Prognostic indicators significant for: estrogen receptor, 70% positive, with weak staining intensity and progesterone receptor, 0% negative. Proliferation marker Ki67 at 70%. HER2 positive by immunohistochemistry.    The patient's subsequent history is as detailed below.   PAST MEDICAL HISTORY: Past Medical History:  Diagnosis Date  . Allergy   . Arthritis   . Asthma   . Cancer [facility])    R breast cancer  . Diabetes mellitus without complication (HCC)    no meds now  . Family history of adverse reaction to anesthesia    "entire family takes a long time to wake up- N&V for several days"  . Family history of lung cancer   .  Hypertension   . Migraine   . PONV (postoperative nausea and vomiting)     PAST SURGICAL HISTORY: Past Surgical History:  Procedure Laterality Date  . BREAST CYST EXCISION  1994  . BREAST LUMPECTOMY WITH RADIOACTIVE SEED AND SENTINEL LYMPH NODE BIOPSY Right 12/27/2018   Procedure: RIGHT BREAST LUMPECTOMY WITH RADIOACTIVE SEED X2 AND RIGHT SEED TARGETED AXILLARY  SENTINEL LYMPH NODE BIOPSY;  Surgeon: Alphonsa Overall, MD;  Location: [facility];  Service: General;  Laterality: Right;  . DILATION AND CURETTAGE OF UTERUS    . PORTACATH PLACEMENT Right 08/01/2018   Procedure: INSERTION PORT-A-CATH WITH ULTRASOUND;  Surgeon: Alphonsa Overall, MD;  Location: [facility];  Service: General;  Laterality: Right;    FAMILY HISTORY: Family History  Problem Relation Age of Onset  . Hypertension Father   . Cancer Father 72       lung cancer  . Stroke Father   . Heart disease Mother        arrhythmia  . Hypertension Mother   . Kidney failure Sister    Rowyn's father died from lung cancer at age 79; he was a heavy smoker. Patients' mother is 89 as of 07/2018. The patient has 3 brothers and 2 sisters. Patient denies anyone in her family having breast, ovarian, prostate, or pancreatic cancer. Namya's maternal great great uncle was diagnosed with lung cancer.     GYNECOLOGIC HISTORY:  Patient's last menstrual period was 11/14/2013. 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 Her husband, [name], is a Administrator. At home with them is two toy poodles. Shauna has 4 children, [name], Eppie Gibson, and [name]. [name] is 52, lives in [place], and works in Science writer. [name] is 84, lives in [place], and works in Engineer, technical sales. [name] is 20, is taking care of Ashwika, and works as a Control and instrumentation engineer. [name] is 30, lives in [place], and is a Buyer, retail.   ADVANCED DIRECTIVES: In the absence of any documentation, Nhung's spouse, [name], is her healthcare power of attorney.     HEALTH MAINTENANCE: Social History   Tobacco Use  . Smoking status: Former Smoker    Packs/day: 1.00    Years: 5.00    Pack years:  5.00    Quit date: 05/08/1988    Years since quitting: 31.0  . Smokeless tobacco: Never Used  Substance Use Topics  . Alcohol use: Yes    Comment: social  . Drug use: No    Colonoscopy: yes, 2010 - due 2020.  PAP: 2017  Bone density: yes, 2015, normal Mammography: 07/01/2018   Allergies  Allergen Reactions  . Penicillins Hives    Did it involve swelling of the face/tongue/throat, SOB, or low BP? Yes--syncope episode Did it involve sudden or severe rash/hives, skin peeling, or any reaction on the inside of your mouth or nose? No Did you need to seek medical attention at a hospital or doctor's office? Yes When did it last happen? 37-38 YEARS AGO If all above answers are "NO", may proceed with cephalosporin use.   . Metformin And Related Diarrhea  . Adhesive [Tape] Other (See Comments)    "severe bruising"  ALSO SKIN GLUE  . Asa [Aspirin] Other (See Comments)    GI upset  Current Outpatient Medications  Medication Sig Dispense Refill  . albuterol (VENTOLIN HFA) 108 (90 Base) MCG/ACT inhaler Inhale 2 puffs into the lungs every 4 (four) hours as needed for wheezing or shortness of breath (cough, shortness of breath or wheezing.). 1 Inhaler 1  . amLODipine (NORVASC) 5 MG tablet Take 1 tablet (5 mg total) by mouth daily. Start with 2.5 mg (Patient taking differently: Take 5 mg by mouth daily. Start with 2.5 mg) 90 tablet 3  . anastrozole (ARIMIDEX) 1 MG tablet Take 1 tablet (1 mg total) by mouth daily. 90 tablet 4  . atenolol (TENORMIN) 100 MG tablet Take 1 tablet (100 mg total) by mouth daily. 90 tablet 1  . blood glucose meter kit and supplies KIT Dispense based on patient and insurance preference. Use up to four times daily as directed. (FOR ICD-9 250.00, 250.01). 1 each 0  . gabapentin (NEURONTIN) 300 MG capsule Take 1 capsule (300 mg total) by mouth at bedtime. 90 capsule 4  . glipiZIDE (GLUCOTROL) 5 MG tablet Start with 5 mg once a day, then increase to twice a day  after 1 week.  Continue to increase as directed by MD 120 tablet 5  . glucose blood (ACCU-CHEK ACTIVE STRIPS) test strip Use as instructed- check blood sugar up to 2x a day 100 each 12  . lisinopril-hydrochlorothiazide (ZESTORETIC) 20-12.5 MG tablet Take 2 tablets by mouth daily.    . potassium chloride SA (K-DUR) 20 MEQ tablet Take 1 tablet (20 mEq total) by mouth daily. 90 tablet 4  . prochlorperazine (COMPAZINE) 10 MG tablet Take 1 tablet (10 mg total) by mouth every 6 (six) hours as needed for nausea or vomiting. 30 tablet 0  . Vitamin D, Ergocalciferol, (DRISDOL) 1.25 MG (50000 UT) CAPS capsule TAKE 1 CAPSULE BY MOUTH EVERY 7 DAYS 12 capsule 1   No current facility-administered medications for this visit.     OBJECTIVE: Middle-aged African-American woman in no acute distress  Vitals:   05/15/19 1237  BP: (!) 146/85  Pulse: 70  Resp: 16  Temp: 98.4 F (36.9 C)  SpO2: 99%     Body mass index is 31.22 kg/m.   Wt Readings from Last 3 Encounters:  05/15/19 193 lb 6.4 oz (87.7 kg)  04/17/19 192 lb 9.6 oz (87.4 kg)  03/06/19 196 lb 9.6 oz (89.2 kg)    ECOG FS:1 - Symptomatic but completely ambulatory  Sclerae unicteric, EOMs intact Wearing a mask No cervical or supraclavicular adenopathy Lungs no rales or rhonchi Heart regular rate and rhythm Abd soft, nontender, positive bowel sounds MSK no focal spinal tenderness, no upper extremity lymphedema Neuro: nonfocal, well oriented, appropriate affect Breasts: The right breast is status post lumpectomy and radiation.  There is some scar tissue near the axilla which tracks with the incision and there is some skin coarsening.  There is no evidence of local recurrence.  Left breast is benign.  Both axillae are benign.  LAB RESULTS:  CMP     Component Value Date/Time   NA 141 05/15/2019 1223   K 3.9 05/15/2019 1223   CL 102 05/15/2019 1223   CO2 29 05/15/2019 1223   GLUCOSE 127 (H) 05/15/2019 1223   BUN 13 05/15/2019 1223    CREATININE 0.83 05/15/2019 1223   CREATININE 0.72 10/08/2018 1000   CREATININE 0.70 04/14/2015 1558   CALCIUM 10.0 05/15/2019 1223   PROT 7.6 05/15/2019 1223   ALBUMIN 4.0 05/15/2019 1223   AST 15 05/15/2019 1223   AST  17 10/08/2018 1000   ALT 13 05/15/2019 1223   ALT 23 10/08/2018 1000   ALKPHOS 67 05/15/2019 1223   BILITOT 0.4 05/15/2019 1223   BILITOT <0.2 (L) 10/08/2018 1000   GFRNONAA >60 05/15/2019 1223   GFRNONAA >60 10/08/2018 1000   GFRNONAA 87 03/27/2014 1130   GFRAA >60 05/15/2019 1223   GFRAA >60 10/08/2018 1000   GFRAA >89 03/27/2014 1130    No results found for: TOTALPROTELP, ALBUMINELP, A1GS, A2GS, BETS, BETA2SER, GAMS, MSPIKE, SPEI  No results found for: KPAFRELGTCHN, LAMBDASER, KAPLAMBRATIO  Lab Results  Component Value Date   WBC 4.5 05/15/2019   NEUTROABS 2.9 05/15/2019   HGB 12.6 05/15/2019   HCT 37.6 05/15/2019   MCV 90.2 05/15/2019   PLT 280 05/15/2019   No results found for: LABCA2  No components found for: [id number]  No results for input(s): INR in the last 168 hours.  No results found for: LABCA2  No results found for: YQM250  No results found for: IBB048  No results found for: GQB169  No results found for: CA2729  No components found for: HGQUANT  No results found for: CEA1 / No results found for: CEA1   No results found for: AFPTUMOR  No results found for: CHROMOGRNA  No results found for: HGBA, HGBA2QUANT, HGBFQUANT, HGBSQUAN (Hemoglobinopathy evaluation)   No results found for: LDH  No results found for: IRON, TIBC, IRONPCTSAT (Iron and TIBC)  No results found for: FERRITIN  Urinalysis    Component Value Date/Time   COLORURINE YELLOW 03/07/2016 1350   APPEARANCEUR CLEAR 03/07/2016 1350   LABSPEC 1.031 (H) 03/07/2016 1350   PHURINE 5.0 03/07/2016 1350   GLUCOSEU >1000 (A) 03/07/2016 1350   HGBUR NEGATIVE 03/07/2016 1350   BILIRUBINUR NEGATIVE 03/07/2016 1350   BILIRUBINUR neg 11/24/2013 1547   KETONESUR 15 (A)  03/07/2016 1350   PROTEINUR NEGATIVE 03/07/2016 1350   UROBILINOGEN 0.2 11/24/2013 1547   UROBILINOGEN 0.2 11/10/2006 1153   NITRITE NEGATIVE 03/07/2016 1350   LEUKOCYTESUR NEGATIVE 03/07/2016 1350     STUDIES:  No results found.   ELIGIBLE FOR AVAILABLE RESEARCH PROTOCOL: No   ASSESSMENT: 59 y.o. [place], [place] woman status post right breast biopsy x2 on 07/10/2018 for multicentric invasive ductal carcinoma, clinically T1c N1, stage IIB, grade 3, functionally triple negative, with an MIB-1 of 70%  (a) right axillary lymph node biopsied at the same time was positive  (1) neoadjuvant chemotherapy consisting of doxorubicin and cyclophosphamide in dose dense fashion x4 started 08/07/2018, completed 09/16/2018, followed by weekly Abraxane and carboplatin x12 starting 08/07/2018, stopped after 2 cycles (last dose on 10/15/2018) due to peripheral neuropathy   (2) Right breast lumpectomy on 12/27/2018 shows a ypT1b ypN0 residual invasive ductal carcinoma, margins negative.  (a) 5 sentinel nodes were negative.   (b) Estrogen receptor 75% positive, weak, Progesterone receptor negative.  (3) adjuvant radiation  Radiation Treatment Dates: 02/18/2019 through 04/02/2019 Site Technique Total Dose (Gy) Dose per Fx (Gy) Completed Fx Beam Energies  Breast: Breast_Rt 3D 50.4/50.4 1.8 28/28 6X, 10X  Breast: Breast_Rt_axilla 3D 50.4/50.4 1.8 28/28 6X, 10X  Breast: Breast_Rt_Bst 3D 20/20 2 10/10 6X, 10X    (4) genetics testing 08/06/2018 through the Breast + GYN Cancers Panel offered by [facility] found no deleterious mutations in ATM, BARD1, BRCA1, BRCA2, BRIP1, CDH1, CHEK2, DICER1, EPCAM, MLH1, MSH2, MSH6, NBN, NF1, PALB2, PMS2, PTEN, RAD50, RAD51C, RAD51D, SMARCA4, STK11, TP53.   (5) breast reconstruction to follow  (6) anastrozole started 05/14/2018  PLAN: Dewaine Oats completed  the most intense part of her treatment.  She did very well with it.  After chemotherapy she only had a small area of residual  tumor, no node-negative.  This tumor was estrogen receptor positive even if weakly so.  She understands that the reason this tumor was able to survive the chemo is precisely because it was more slow-growing and ER positive.  There is some chance that there are some breast cancer cells still left and if so they would likely be the estrogen receptor positive once that survived.  That is why I think she would benefit from antiestrogens.  We discussed specifically anastrozole in detail.  She has a good understanding of the possible toxicities side effects and complications of this agent.  She is agreeable to giving it a try and I have gone ahead and placed the prescription.  She is not having hot flashes at this point but she is having insomnia.  I think she would benefit from gabapentin given her neuropathy.  We will only use it at bedtime.  This should help her sleep, should help with hot flashes if they were to develop and should help the neuropathy as well.  If she tolerates the anastrozole well we will do it for 5 years.  We will do a bone density at some point and she is already on vitamin D.  I have urged her to start a regular walking program  She will see me again in April.  She knows to call for any other issue that may develop before the next visit.  Total time this visit was 25 minutes.  Virgie Dad. Rosaline Ezekiel, MD  05/15/19 1:12 PM Medical Oncology and Hematology [facility] [place], [place] [address] Tel. [phone]    Fax. [phone]   I, Wilburn Mylar, am acting as scribe for Dr. Virgie Dad. Bricelyn Freestone.  I, Lurline Del MD, have reviewed the above documentation for accuracy and completeness, and I agree with the above.

## 2019-05-15 ENCOUNTER — Inpatient Hospital Stay: Payer: 59 | Attending: Oncology | Admitting: Oncology

## 2019-05-15 ENCOUNTER — Inpatient Hospital Stay: Payer: 59

## 2019-05-15 ENCOUNTER — Other Ambulatory Visit: Payer: Self-pay

## 2019-05-15 VITALS — BP 146/85 | HR 70 | Temp 98.4°F | Resp 16 | Wt 193.4 lb

## 2019-05-15 DIAGNOSIS — Z79899 Other long term (current) drug therapy: Secondary | ICD-10-CM | POA: Insufficient documentation

## 2019-05-15 DIAGNOSIS — C50811 Malignant neoplasm of overlapping sites of right female breast: Secondary | ICD-10-CM

## 2019-05-15 DIAGNOSIS — Z87891 Personal history of nicotine dependence: Secondary | ICD-10-CM | POA: Insufficient documentation

## 2019-05-15 DIAGNOSIS — I10 Essential (primary) hypertension: Secondary | ICD-10-CM | POA: Diagnosis not present

## 2019-05-15 DIAGNOSIS — IMO0001 Reserved for inherently not codable concepts without codable children: Secondary | ICD-10-CM

## 2019-05-15 DIAGNOSIS — E119 Type 2 diabetes mellitus without complications: Secondary | ICD-10-CM | POA: Diagnosis not present

## 2019-05-15 DIAGNOSIS — Z9181 History of falling: Secondary | ICD-10-CM | POA: Diagnosis not present

## 2019-05-15 DIAGNOSIS — Z79811 Long term (current) use of aromatase inhibitors: Secondary | ICD-10-CM | POA: Insufficient documentation

## 2019-05-15 DIAGNOSIS — Z170 Estrogen receptor positive status [ER+]: Secondary | ICD-10-CM | POA: Diagnosis not present

## 2019-05-15 DIAGNOSIS — G4700 Insomnia, unspecified: Secondary | ICD-10-CM | POA: Insufficient documentation

## 2019-05-15 DIAGNOSIS — G629 Polyneuropathy, unspecified: Secondary | ICD-10-CM | POA: Insufficient documentation

## 2019-05-15 DIAGNOSIS — Z801 Family history of malignant neoplasm of trachea, bronchus and lung: Secondary | ICD-10-CM | POA: Insufficient documentation

## 2019-05-15 DIAGNOSIS — Z7984 Long term (current) use of oral hypoglycemic drugs: Secondary | ICD-10-CM | POA: Insufficient documentation

## 2019-05-15 DIAGNOSIS — J45909 Unspecified asthma, uncomplicated: Secondary | ICD-10-CM | POA: Diagnosis not present

## 2019-05-15 DIAGNOSIS — C50911 Malignant neoplasm of unspecified site of right female breast: Secondary | ICD-10-CM | POA: Insufficient documentation

## 2019-05-15 LAB — CBC WITH DIFFERENTIAL/PLATELET
Abs Immature Granulocytes: 0.03 10*3/uL (ref 0.00–0.07)
Basophils Absolute: 0 10*3/uL (ref 0.0–0.1)
Basophils Relative: 0 %
Eosinophils Absolute: 0.1 10*3/uL (ref 0.0–0.5)
Eosinophils Relative: 2 %
HCT: 37.6 % (ref 36.0–46.0)
Hemoglobin: 12.6 g/dL (ref 12.0–15.0)
Immature Granulocytes: 1 %
Lymphocytes Relative: 23 %
Lymphs Abs: 1 10*3/uL (ref 0.7–4.0)
MCH: 30.2 pg (ref 26.0–34.0)
MCHC: 33.5 g/dL (ref 30.0–36.0)
MCV: 90.2 fL (ref 80.0–100.0)
Monocytes Absolute: 0.4 10*3/uL (ref 0.1–1.0)
Monocytes Relative: 9 %
Neutro Abs: 2.9 10*3/uL (ref 1.7–7.7)
Neutrophils Relative %: 65 %
Platelets: 280 10*3/uL (ref 150–400)
RBC: 4.17 MIL/uL (ref 3.87–5.11)
RDW: 13.6 % (ref 11.5–15.5)
WBC: 4.5 10*3/uL (ref 4.0–10.5)
nRBC: 0 % (ref 0.0–0.2)

## 2019-05-15 LAB — COMPREHENSIVE METABOLIC PANEL
ALT: 13 U/L (ref 0–44)
AST: 15 U/L (ref 15–41)
Albumin: 4 g/dL (ref 3.5–5.0)
Alkaline Phosphatase: 67 U/L (ref 38–126)
Anion gap: 10 (ref 5–15)
BUN: 13 mg/dL (ref 6–20)
CO2: 29 mmol/L (ref 22–32)
Calcium: 10 mg/dL (ref 8.9–10.3)
Chloride: 102 mmol/L (ref 98–111)
Creatinine, Ser: 0.83 mg/dL (ref 0.44–1.00)
GFR calc Af Amer: >60 mL/min (ref >60)
GFR calc non Af Amer: >60 mL/min (ref >60)
Glucose, Bld: 127 mg/dL — ABNORMAL HIGH (ref 70–99)
Potassium: 3.9 mmol/L (ref 3.5–5.1)
Sodium: 141 mmol/L (ref 135–145)
Total Bilirubin: 0.4 mg/dL (ref 0.3–1.2)
Total Protein: 7.6 g/dL (ref 6.5–8.1)

## 2019-05-15 MED ORDER — ANASTROZOLE 1 MG PO TABS: 1.0000 mg | ORAL_TABLET | Freq: Every day | ORAL | 4 refills | Status: DC

## 2019-05-15 MED ORDER — GABAPENTIN 300 MG PO CAPS: 300.0000 mg | ORAL_CAPSULE | Freq: Every day | ORAL | 4 refills | Status: DC

## 2019-05-16 ENCOUNTER — Telehealth: Payer: Self-pay | Admitting: Oncology

## 2019-05-16 NOTE — Telephone Encounter (Signed)
I left a message regarding schedule  

## 2019-05-22 ENCOUNTER — Other Ambulatory Visit: Payer: Self-pay

## 2019-05-22 ENCOUNTER — Ambulatory Visit
Admission: RE | Admit: 2019-05-22 | Discharge: 2019-05-22 | Disposition: A | Discharge status: Home / Self Care | Payer: 59 | Source: Ambulatory Visit | Attending: Radiation Oncology | Admitting: Radiation Oncology

## 2019-05-22 ENCOUNTER — Encounter: Payer: Self-pay | Admitting: Radiation Oncology

## 2019-05-22 VITALS — BP 141/89 | HR 70 | Temp 97.3°F | Resp 18 | Ht 66.0 in | Wt 192.1 lb

## 2019-05-22 DIAGNOSIS — C50811 Malignant neoplasm of overlapping sites of right female breast: Secondary | ICD-10-CM

## 2019-05-22 DIAGNOSIS — Z7984 Long term (current) use of oral hypoglycemic drugs: Secondary | ICD-10-CM | POA: Diagnosis not present

## 2019-05-22 DIAGNOSIS — Z79899 Other long term (current) drug therapy: Secondary | ICD-10-CM | POA: Insufficient documentation

## 2019-05-22 DIAGNOSIS — Z170 Estrogen receptor positive status [ER+]: Secondary | ICD-10-CM | POA: Insufficient documentation

## 2019-05-22 DIAGNOSIS — Z923 Personal history of irradiation: Secondary | ICD-10-CM | POA: Insufficient documentation

## 2019-05-22 NOTE — Patient Instructions (Signed)
Coronavirus (COVID-19) Are you at risk?  Are you at risk for the Coronavirus (COVID-19)?  To be considered HIGH RISK for Coronavirus (COVID-19), you have to meet the following criteria:  . Traveled to [place], [place], [place], [place] or [place]; or in the [place] to [place], [place], [place], or [place]; and have fever, cough, and shortness of breath within the last 2 weeks of travel OR . Been in close contact with a person diagnosed with COVID-19 within the last 2 weeks and have fever, cough, and shortness of breath . IF YOU DO NOT MEET THESE CRITERIA, YOU ARE CONSIDERED LOW RISK FOR COVID-19.  What to do if you are HIGH RISK for COVID-19?  . If you are having a medical emergency, call 911. . Seek medical care right away. Before you go to a doctor's office, urgent care or emergency department, call ahead and tell them about your recent travel, contact with someone diagnosed with COVID-19, and your symptoms. You should receive instructions from your physician's office regarding next steps of care.  . When you arrive at healthcare provider, tell the healthcare staff immediately you have returned from visiting [place], [place], [place], [place] or [place]; or traveled in the [place] to [place], [place], [place], or [place]; in the last two weeks or you have been in close contact with a person diagnosed with COVID-19 in the last 2 weeks.   . Tell the health care staff about your symptoms: fever, cough and shortness of breath. . After you have been seen by a medical provider, you will be either: o Tested for (COVID-19) and discharged home on quarantine except to seek medical care if symptoms worsen, and asked to  - Stay home and avoid contact with others until you get your results (4-5 days)  - Avoid travel on public transportation if possible (such as bus, train, or airplane) or o Sent to the Emergency Department by EMS for evaluation, COVID-19 testing, and possible  admission depending on your condition and test results.  What to do if you are LOW RISK for COVID-19?  Reduce your risk of any infection by using the same precautions used for avoiding the common cold or flu:  . Wash your hands often with soap and warm water for at least 20 seconds.  If soap and water are not readily available, use an alcohol-based hand sanitizer with at least 60% alcohol.  . If coughing or sneezing, cover your mouth and nose by coughing or sneezing into the elbow areas of your shirt or coat, into a tissue or into your sleeve (not your hands). . Avoid shaking hands with others and consider head nods or verbal greetings only. . Avoid touching your eyes, nose, or mouth with unwashed hands.  . Avoid close contact with people who are sick. . Avoid places or events with large numbers of people in one location, like concerts or sporting events. . Carefully consider travel plans you have or are making. . If you are planning any travel outside or inside the [place], visit the CDC's Travelers' Health webpage for the latest health notices. . If you have some symptoms but not all symptoms, continue to monitor at home and seek medical attention if your symptoms worsen. . If you are having a medical emergency, call 911.   ADDITIONAL HEALTHCARE OPTIONS FOR PATIENTS  [facility] / e-Visit: [email or web address]         [facility]: [phone]  [facility]: [phone]                   [facility]: [phone]   

## 2019-05-22 NOTE — Progress Notes (Signed)
Radiation Oncology         [phone] ________________________________  Name: Autumn Cook MRN: [id number]  Date: 05/22/2019  DOB: [birth year]  Follow-Up Visit Note  CC: Copland, Gay Filler, MD  Magrinat, Virgie Dad, MD    ICD-10-CM   1. Malignant neoplasm of overlapping sites of right breast in female, estrogen receptor positive ([place])  C50.811    Z17.0     Diagnosis:   StageIIB(ypT1b, ypN0)RightBreast Multicentric,Invasive DuctalCarcinoma, ER+/ PR-/ Her2-, Grade3  Interval Since Last Radiation:  7 weeks  02/18/2019 through 04/02/2019 Site Technique Total Dose (Gy) Dose per Fx (Gy) Completed Fx Beam Energies  Breast: Breast_Rt 3D 50.4/50.4 1.8 28/28 6X, 10X  Breast: Breast_Rt_axilla 3D 50.4/50.4 1.8 28/28 6X, 10X  Breast: Breast_Rt_Bst 3D 20/20 2 10/10 6X, 10X    Narrative:  The patient returns today for routine follow-up. She last saw [name] on 05/15/2019 and was started on anastrozole at that visit.  She will pick this medication up today and start soon.  On review of systems, she reports occasional sharp shooting pains in the breast which have decreased significantly over the past month.  Patient denies any nipple discharge or bleeding.  She reports that the swelling in her breast has improved significantly over the past several weeks.  She denies any problems with swelling in her right arm or hand but does notice some numbness along the upper portion of her right arm, present since her surgery  ALLERGIES:  is allergic to penicillins; metformin and related; adhesive [tape]; and asa [aspirin].  Meds: Current Outpatient Medications  Medication Sig Dispense Refill  . albuterol (VENTOLIN HFA) 108 (90 Base) MCG/ACT inhaler Inhale 2 puffs into the lungs every 4 (four) hours as needed for wheezing or shortness of breath (cough, shortness of breath or wheezing.). 1 Inhaler 1  . amLODipine (NORVASC) 5 MG tablet Take 1 tablet (5 mg total) by mouth daily. Start with 2.5 mg  (Patient taking differently: Take 5 mg by mouth daily. Start with 2.5 mg) 90 tablet 3  . anastrozole (ARIMIDEX) 1 MG tablet Take 1 tablet (1 mg total) by mouth daily. 90 tablet 4  . atenolol (TENORMIN) 100 MG tablet Take 1 tablet (100 mg total) by mouth daily. 90 tablet 1  . blood glucose meter kit and supplies KIT Dispense based on patient and insurance preference. Use up to four times daily as directed. (FOR ICD-9 250.00, 250.01). 1 each 0  . gabapentin (NEURONTIN) 300 MG capsule Take 1 capsule (300 mg total) by mouth at bedtime. 90 capsule 4  . glipiZIDE (GLUCOTROL) 5 MG tablet Start with 5 mg once a day, then increase to twice a day after 1 week.  Continue to increase as directed by MD 120 tablet 5  . glucose blood (ACCU-CHEK ACTIVE STRIPS) test strip Use as instructed- check blood sugar up to 2x a day 100 each 12  . lisinopril-hydrochlorothiazide (ZESTORETIC) 20-12.5 MG tablet Take 2 tablets by mouth daily.    . potassium chloride SA (K-DUR) 20 MEQ tablet Take 1 tablet (20 mEq total) by mouth daily. 90 tablet 4  . prochlorperazine (COMPAZINE) 10 MG tablet Take 1 tablet (10 mg total) by mouth every 6 (six) hours as needed for nausea or vomiting. 30 tablet 0  . Vitamin D, Ergocalciferol, (DRISDOL) 1.25 MG (50000 UT) CAPS capsule TAKE 1 CAPSULE BY MOUTH EVERY 7 DAYS 12 capsule 1   No current facility-administered medications for this encounter.    Physical Findings: The patient is in  no acute distress. Patient is alert and oriented.  height is 5' 6"  (1.676 m) and weight is 192 lb 2 oz (87.1 kg). Her temporal temperature is 97.3 F (36.3 C) (abnormal). Her blood pressure is 141/89 (abnormal) and her pulse is 70. Her respiration is 18 and oxygen saturation is 100%. .   Lungs are clear to auscultation bilaterally. Heart has regular rate and rhythm. No palpable cervical, supraclavicular, or axillary adenopathy. Abdomen soft, non-tender, normal bowel sounds. Left Breast: no palpable mass, nipple  discharge or bleeding. Right Breast: Patient skin is healed well.  She continues to have some hyperpigmentation changes but improved overall since last exam.  Swelling in the breast has improved significantly since my last exam.  No dominant mass appreciated breast nipple discharge or bleeding.  Lab Findings: Lab Results  Component Value Date   WBC 4.5 05/15/2019   HGB 12.6 05/15/2019   HCT 37.6 05/15/2019   MCV 90.2 05/15/2019   PLT 280 05/15/2019    Radiographic Findings: No results found.  Impression:  The patient is recovering from the effects of radiation.  Patient skin is healed well.  Less swelling noted in the breast.  She denies any chronic pain in the breast.  We discussed consideration for physical therapy but at this point the patient is  really not experiencing any significant discomfort.  Plan: As needed follow-up in radiation oncology.  Patient will continue close follow-up in medical oncology and general surgery will continue on adjuvant hormonal therapy  ____________________________________ Gery Pray, MD   This document serves as a record of services personally performed by Gery Pray, MD. It was created on his behalf by Wilburn Mylar, a trained medical scribe. The creation of this record is based on the scribe's personal observations and the provider's statements to them. This document has been checked and approved by the attending provider.

## 2019-05-22 NOTE — Progress Notes (Signed)
Pt presents today for f/u with Dr. Sondra Come. Pt reports fatigue is ongoing, this RN gave suggestions of increasing physical activity to help combat fatigue. Pt denies c/o pain in breast. Pt using body lotion on breast. Breast swollen with hyperpigmentation noted. Skin intact.  BP (!) 141/89 (BP Location: Left Arm, Patient Position: Sitting)   Pulse 70   Temp (!) 97.3 F (36.3 C) (Temporal)   Resp 18   Ht 5\' 6"  (1.676 m)   Wt 192 lb 2 oz (87.1 kg)   LMP 11/14/2013   SpO2 100%   BMI 31.01 kg/m   Wt Readings from Last 3 Encounters:  05/22/19 192 lb 2 oz (87.1 kg)  05/15/19 193 lb 6.4 oz (87.7 kg)  04/17/19 192 lb 9.6 oz (87.4 kg)   Loma Sousa, RN BSN

## 2019-06-18 ENCOUNTER — Other Ambulatory Visit: Payer: Self-pay | Admitting: Family Medicine

## 2019-06-18 DIAGNOSIS — I10 Essential (primary) hypertension: Secondary | ICD-10-CM

## 2019-06-20 ENCOUNTER — Encounter: Payer: Self-pay | Admitting: Oncology

## 2019-06-22 NOTE — Progress Notes (Signed)
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 She messaged [place] via [facility] on 06/20/2019 reporting a piercing pain under her arm and in her right breast, as well as tenderness underneath the breast.  She was started on anastrozole at her last visit on 05/15/2019.  She is tolerating this well, with no significant hot flashes or vaginal dryness problems.  She is obtaining it at a good price  Since her last visit, she has not undergone any additional studies. She would be due for annual mammography later this month except of course that she is still recovering from her recent treatments and radiation    REVIEW OF SYSTEMS: Autumn Cook has significant pain in the right armpit, the medial aspect of her right upper arm, and a little bit under the right breast laterally.  This is of course classic and normal in the sense that it happens postop.  She has not yet had physical therapy.  She was taking Neurontin to help her sleep but it made her confused so she is not taking it anymore.  As a result she is having problems with insomnia.  She joined [employer] but has not yet started going.  She is still having problems with peripheral neuropathy and had a fall not that long ago because she  was not quite sure where her feet were.  She has right knee pain.  Aside from these issues a detailed review of systems today was stable   HISTORY OF CURRENT ILLNESS: From the original intake note:  [name] had routine screening mammography on 07/01/2018 showing a possible abnormality in the right breast; she notes that did feel some pain and a lump in her breast leading up to the routine screening. She underwent right breast ultrasonography at [facility] on 07/05/2018 showing: A 1.4 x 1.2 x 1.4 cm oval hypoechoic mass at the 5:30 position 8 cm from the nipple. A 0.9 x 1 x 1 cm slightly irregular hypoechoic mass at the 10:30 position 9 cm from the nipple. A 3 x 1.9 x 3 cm probable lymph node without fatty hilum in the LOWER RIGHT axilla. A RIGHT axillary lymph node with slightly thickened cortex.  Accordingly on 07/10/2018 she proceeded to biopsy of the right breast area in question. The pathology from this procedure showed ([id number]): invasive ductal carcinoma, high grade, 5:30 o'clock. Prognostic indicators significant for: estrogen receptor, 30% positive with weak staining intensity and progesterone receptor, 0% negative. Proliferation marker Ki67 at 70%.   An additional biopsy was performed on the same day ([id number]) showing:  2. Breast, right, needle core biopsy, 10 o'clock - Invasive carcinoma  Finally, an additional biopsy was performed at the right axilla on the same day ([id number]) showing: invasive carcinoma, right axilla. Prognostic indicators significant for: estrogen receptor, 70% positive, with weak staining intensity and progesterone receptor, 0% negative. Proliferation marker Ki67  at 70%. HER2 positive by immunohistochemistry.    The patient's subsequent history is as detailed below.   PAST MEDICAL HISTORY: Past Medical History:  Diagnosis Date  . Allergy   . Arthritis   . Asthma   . Cancer [facility])    R breast cancer  . Diabetes mellitus without complication  (HCC)    no meds now  . Family history of adverse reaction to anesthesia    "entire family takes a long time to wake up- N&V for several days"  . Family history of lung cancer   . Hypertension   . Migraine   . PONV (postoperative nausea and vomiting)     PAST SURGICAL HISTORY: Past Surgical History:  Procedure Laterality Date  . BREAST CYST EXCISION  1994  . BREAST LUMPECTOMY WITH RADIOACTIVE SEED AND SENTINEL LYMPH NODE BIOPSY Right 12/27/2018   Procedure: RIGHT BREAST LUMPECTOMY WITH RADIOACTIVE SEED X2 AND RIGHT SEED TARGETED AXILLARY SENTINEL LYMPH NODE BIOPSY;  Surgeon: Alphonsa Overall, MD;  Location: [facility];  Service: General;  Laterality: Right;  . DILATION AND CURETTAGE OF UTERUS    . PORTACATH PLACEMENT Right 08/01/2018   Procedure: INSERTION PORT-A-CATH WITH ULTRASOUND;  Surgeon: Alphonsa Overall, MD;  Location: [facility];  Service: General;  Laterality: Right;    FAMILY HISTORY: Family History  Problem Relation Age of Onset  . Hypertension Father   . Cancer Father 6       lung cancer  . Stroke Father   . Heart disease Mother        arrhythmia  . Hypertension Mother   . Kidney failure Sister    Signora's father died from lung cancer at age 105; he was a heavy smoker. Patients' mother is 61 as of 07/2018. The patient has 3 brothers and 2 sisters. Patient denies anyone in her family having breast, ovarian, prostate, or pancreatic cancer. Autumn Cook's maternal great great uncle was diagnosed with lung cancer.     GYNECOLOGIC HISTORY:  Patient's last menstrual period was 11/14/2013. 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 Her husband, Autumn Cook, is a Administrator.  The patient separated from her husband in  January 2021 and they are planning on an eventual divorce.  Alixis has 4 children, Autumn Cook, Autumn Cook, and Autumn Cook. Autumn Cook is 30, lives in [place], and works in Science writer. [name] is 70, lives in [place], and works in Engineer, technical sales. [name] is 60, is taking care of Jeanenne, and works as a Control and instrumentation engineer. Autumn Cook is 38, lives in [place], and is a Buyer, retail.   ADVANCED DIRECTIVES: At the 06/23/2019 visit the patient was given the appropriate documents to complete and notarized at her discretion.  She is planning to name her daughter [name] as her healthcare power of attorney.  She can be reached at [phone].  HEALTH MAINTENANCE: Social History   Tobacco Use  . Smoking status: Former Smoker    Packs/day: 1.00    Years: 5.00    Pack years: 5.00    Quit date: 05/08/1988    Years since quitting: 31.1  . Smokeless tobacco: Never Used  Substance Use Topics  . Alcohol use: Yes    Comment: social  . Drug use: No  Colonoscopy: yes, 2010 - due 2020.  PAP: 2017  Bone density: yes, 2015, normal Mammography: 07/01/2018   Allergies  Allergen Reactions  . Penicillins Hives    Did it involve swelling of the face/tongue/throat, SOB, or low BP? Yes--syncope episode Did it involve sudden or severe rash/hives, skin peeling, or any reaction on the inside of your mouth or nose? No Did you need to seek medical attention at a hospital or doctor's office? Yes When did it last happen? 37-38 YEARS AGO If all above answers are "NO", may proceed with cephalosporin use.   . Metformin And Related Diarrhea  . Adhesive [Tape] Other (See Comments)    "severe bruising"  ALSO SKIN GLUE  . Asa [Aspirin] Other (See Comments)    GI upset     Current Outpatient Medications  Medication Sig Dispense Refill  . albuterol (VENTOLIN HFA) 108 (90 Base) MCG/ACT inhaler Inhale 2 puffs into the lungs every 4 (four) hours as needed for wheezing or shortness of breath (cough, shortness of breath or  wheezing.). 1 Inhaler 1  . amLODipine (NORVASC) 5 MG tablet Take 1 tablet (5 mg total) by mouth daily. Start with 2.5 mg (Patient taking differently: Take 5 mg by mouth daily. Start with 2.5 mg) 90 tablet 3  . anastrozole (ARIMIDEX) 1 MG tablet Take 1 tablet (1 mg total) by mouth daily. 90 tablet 4  . atenolol (TENORMIN) 100 MG tablet TAKE 1 TABLET BY MOUTH EVERY DAY 30 tablet 5  . blood glucose meter kit and supplies KIT Dispense based on patient and insurance preference. Use up to four times daily as directed. (FOR ICD-9 250.00, 250.01). 1 each 0  . gabapentin (NEURONTIN) 300 MG capsule Take 1 capsule (300 mg total) by mouth at bedtime. 90 capsule 4  . glipiZIDE (GLUCOTROL) 5 MG tablet Start with 5 mg once a day, then increase to twice a day after 1 week.  Continue to increase as directed by MD 120 tablet 5  . glucose blood (ACCU-CHEK ACTIVE STRIPS) test strip Use as instructed- check blood sugar up to 2x a day 100 each 12  . lisinopril-hydrochlorothiazide (ZESTORETIC) 20-12.5 MG tablet Take 2 tablets by mouth daily.    . potassium chloride SA (K-DUR) 20 MEQ tablet Take 1 tablet (20 mEq total) by mouth daily. 90 tablet 4  . prochlorperazine (COMPAZINE) 10 MG tablet Take 1 tablet (10 mg total) by mouth every 6 (six) hours as needed for nausea or vomiting. 30 tablet 0  . Vitamin D, Ergocalciferol, (DRISDOL) 1.25 MG (50000 UT) CAPS capsule TAKE 1 CAPSULE BY MOUTH EVERY 7 DAYS 12 capsule 1   No current facility-administered medications for this visit.     OBJECTIVE: Middle-aged African-American woman in no acute distress  Vitals:   06/23/19 0939  BP: (!) 145/88  Pulse: 79  Resp: 19  Temp: 98 F (36.7 C)  SpO2: 100%     Body mass index is 30.65 kg/m.   Wt Readings from Last 3 Encounters:  06/23/19 189 lb 14.4 oz (86.1 kg)  05/22/19 192 lb 2 oz (87.1 kg)  05/15/19 193 lb 6.4 oz (87.7 kg)    ECOG FS:1 - Symptomatic but completely ambulatory  Sclerae unicteric, EOMs intact Wearing a  mask No cervical or supraclavicular adenopathy Lungs no rales or rhonchi Heart regular rate and rhythm Abd soft, nontender, positive bowel sounds MSK no focal spinal tenderness, no upper extremity lymphedema Neuro: nonfocal, well oriented, appropriate affect Breasts: The right breast is status post lumpectomy  and radiation.  There is still some hyperpigmentation.  There is the expected skin coarsening.  There is no evidence of local recurrence.  The left breast is benign.  Both axillae are benign.   LAB RESULTS:  CMP     Component Value Date/Time   NA 141 05/15/2019 1223   K 3.9 05/15/2019 1223   CL 102 05/15/2019 1223   CO2 29 05/15/2019 1223   GLUCOSE 127 (H) 05/15/2019 1223   BUN 13 05/15/2019 1223   CREATININE 0.83 05/15/2019 1223   CREATININE 0.72 10/08/2018 1000   CREATININE 0.70 04/14/2015 1558   CALCIUM 10.0 05/15/2019 1223   PROT 7.6 05/15/2019 1223   ALBUMIN 4.0 05/15/2019 1223   AST 15 05/15/2019 1223   AST 17 10/08/2018 1000   ALT 13 05/15/2019 1223   ALT 23 10/08/2018 1000   ALKPHOS 67 05/15/2019 1223   BILITOT 0.4 05/15/2019 1223   BILITOT <0.2 (L) 10/08/2018 1000   GFRNONAA >60 05/15/2019 1223   GFRNONAA >60 10/08/2018 1000   GFRNONAA 87 03/27/2014 1130   GFRAA >60 05/15/2019 1223   GFRAA >60 10/08/2018 1000   GFRAA >89 03/27/2014 1130    No results found for: TOTALPROTELP, ALBUMINELP, A1GS, A2GS, BETS, BETA2SER, GAMS, MSPIKE, SPEI  No results found for: KPAFRELGTCHN, LAMBDASER, KAPLAMBRATIO  Lab Results  Component Value Date   WBC 4.5 05/15/2019   NEUTROABS 2.9 05/15/2019   HGB 12.6 05/15/2019   HCT 37.6 05/15/2019   MCV 90.2 05/15/2019   PLT 280 05/15/2019   No results found for: LABCA2  No components found for: [id number]  No results for input(s): INR in the last 168 hours.  No results found for: LABCA2  No results found for: IHK742  No results found for: VZD638  No results found for: VFI433  No results found for: CA2729  No  components found for: HGQUANT  No results found for: CEA1 / No results found for: CEA1   No results found for: AFPTUMOR  No results found for: CHROMOGRNA  No results found for: HGBA, HGBA2QUANT, HGBFQUANT, HGBSQUAN (Hemoglobinopathy evaluation)   No results found for: LDH  No results found for: IRON, TIBC, IRONPCTSAT (Iron and TIBC)  No results found for: FERRITIN  Urinalysis    Component Value Date/Time   COLORURINE YELLOW 03/07/2016 1350   APPEARANCEUR CLEAR 03/07/2016 1350   LABSPEC 1.031 (H) 03/07/2016 1350   PHURINE 5.0 03/07/2016 1350   GLUCOSEU >1000 (A) 03/07/2016 1350   HGBUR NEGATIVE 03/07/2016 1350   BILIRUBINUR NEGATIVE 03/07/2016 1350   BILIRUBINUR neg 11/24/2013 1547   KETONESUR 15 (A) 03/07/2016 1350   PROTEINUR NEGATIVE 03/07/2016 1350   UROBILINOGEN 0.2 11/24/2013 1547   UROBILINOGEN 0.2 11/10/2006 1153   NITRITE NEGATIVE 03/07/2016 1350   LEUKOCYTESUR NEGATIVE 03/07/2016 1350     STUDIES:  No results found.   ELIGIBLE FOR AVAILABLE RESEARCH PROTOCOL: No   ASSESSMENT: 59 y.o. [place], [place] woman status post right breast biopsy x2 on 07/10/2018 for multicentric invasive ductal carcinoma, clinically T1c N1, stage IIB, grade 3, functionally triple negative, with an MIB-1 of 70%  (a) right axillary lymph node biopsied at the same time was positive  (1) neoadjuvant chemotherapy consisting of doxorubicin and cyclophosphamide in dose dense fashion x4 started 08/07/2018, completed 09/16/2018, followed by weekly Abraxane and carboplatin x12 starting 08/07/2018, stopped after 2 cycles (last dose on 10/15/2018) due to peripheral neuropathy   (2) Right breast lumpectomy on 12/27/2018 shows a ypT1b ypN0 residual invasive ductal carcinoma, margins negative.  (  a) 5 sentinel nodes were negative.   (b) Estrogen receptor 75% positive, weak, Progesterone receptor negative.  (3) adjuvant radiation  Radiation Treatment Dates: 02/18/2019 through 04/02/2019 Site  Technique Total Dose (Gy) Dose per Fx (Gy) Completed Fx Beam Energies  Breast: Breast_Rt 3D 50.4/50.4 1.8 28/28 6X, 10X  Breast: Breast_Rt_axilla 3D 50.4/50.4 1.8 28/28 6X, 10X  Breast: Breast_Rt_Bst 3D 20/20 2 10/10 6X, 10X    (4) genetics testing 08/06/2018 through the Breast + GYN Cancers Panel offered by [facility] found no deleterious mutations in ATM, BARD1, BRCA1, BRCA2, BRIP1, CDH1, CHEK2, DICER1, EPCAM, MLH1, MSH2, MSH6, NBN, NF1, PALB2, PMS2, PTEN, RAD50, RAD51C, RAD51D, SMARCA4, STK11, TP53.   (5) anastrozole started 05/14/2018   PLAN: Schuyler is done with local treatment and of course with chemotherapy.  She is still recovering from the surgery and radiation, with significant discomfort in the right armpit/shoulder area.  I think she would benefit from physical therapy for this and also for the balance issue related to peripheral neuropathy.  I have gone ahead and placed those orders.  She is tolerating anastrozole well and the plan will be to continue that a total of 5 years.  I have added a bone density to her next set of mammography which will be in May.  She understands that pain in the surgical bed site is similar to her trauma pain.  Is not related to cancer per se.  It is simply due to the treatment she has received.  She is going to use some Aleve and Tylenol for this.  Hopefully with physical therapy and a little bit more time that pain will improve.  For her insomnia she is going to try Benadryl.  I am delighted that she has joined a gym.  I think it will be very important for her as she moves forward to stay fit.  I also think she would be an excellent candidate for the finding your new normal group and I have given her name to our coordinator.  Finally since she and her husband apparently are planning to divorce I gave her the healthcare power of attorney forms to complete and notarized today.  She is intending to name her daughter [name] as her healthcare power of  attorney.  Total encounter time today 30 minutes.Sarajane Jews C. Collen Hostler, MD  06/23/19 9:42 AM Medical Oncology and Hematology [facility] [place], [place] [address] Tel. [phone]    Fax. [phone]   I, Wilburn Mylar, am acting as scribe for Dr. Virgie Dad. Ashar Lewinski.  I, Lurline Del MD, have reviewed the above documentation for accuracy and completeness, and I agree with the above.   *Total Encounter Time as defined by the Centers for Medicare and Medicaid Services includes, in addition to the face-to-face time of a patient visit (documented in the note above) non-face-to-face time: obtaining and reviewing outside history, ordering and reviewing medications, tests or procedures, care coordination (communications with other health care professionals or caregivers) and documentation in the medical record.

## 2019-06-23 ENCOUNTER — Inpatient Hospital Stay: Payer: 59 | Attending: Oncology | Admitting: Oncology

## 2019-06-23 ENCOUNTER — Other Ambulatory Visit: Payer: Self-pay

## 2019-06-23 ENCOUNTER — Telehealth: Payer: Self-pay | Admitting: Oncology

## 2019-06-23 VITALS — BP 145/88 | HR 79 | Temp 98.0°F | Resp 19 | Ht 66.0 in | Wt 189.9 lb

## 2019-06-23 DIAGNOSIS — C50911 Malignant neoplasm of unspecified site of right female breast: Secondary | ICD-10-CM | POA: Insufficient documentation

## 2019-06-23 DIAGNOSIS — Z801 Family history of malignant neoplasm of trachea, bronchus and lung: Secondary | ICD-10-CM | POA: Insufficient documentation

## 2019-06-23 DIAGNOSIS — Z79811 Long term (current) use of aromatase inhibitors: Secondary | ICD-10-CM | POA: Insufficient documentation

## 2019-06-23 DIAGNOSIS — C50811 Malignant neoplasm of overlapping sites of right female breast: Secondary | ICD-10-CM

## 2019-06-23 DIAGNOSIS — M25561 Pain in right knee: Secondary | ICD-10-CM | POA: Diagnosis not present

## 2019-06-23 DIAGNOSIS — G4700 Insomnia, unspecified: Secondary | ICD-10-CM | POA: Insufficient documentation

## 2019-06-23 DIAGNOSIS — J45909 Unspecified asthma, uncomplicated: Secondary | ICD-10-CM | POA: Diagnosis not present

## 2019-06-23 DIAGNOSIS — I10 Essential (primary) hypertension: Secondary | ICD-10-CM | POA: Insufficient documentation

## 2019-06-23 DIAGNOSIS — Z87891 Personal history of nicotine dependence: Secondary | ICD-10-CM | POA: Diagnosis not present

## 2019-06-23 DIAGNOSIS — E1142 Type 2 diabetes mellitus with diabetic polyneuropathy: Secondary | ICD-10-CM | POA: Insufficient documentation

## 2019-06-23 DIAGNOSIS — M129 Arthropathy, unspecified: Secondary | ICD-10-CM | POA: Diagnosis not present

## 2019-06-23 DIAGNOSIS — G629 Polyneuropathy, unspecified: Secondary | ICD-10-CM | POA: Diagnosis not present

## 2019-06-23 DIAGNOSIS — Z170 Estrogen receptor positive status [ER+]: Secondary | ICD-10-CM | POA: Diagnosis not present

## 2019-06-23 DIAGNOSIS — Z79899 Other long term (current) drug therapy: Secondary | ICD-10-CM | POA: Insufficient documentation

## 2019-06-23 MED ORDER — ANASTROZOLE 1 MG PO TABS: 1.0000 mg | ORAL_TABLET | Freq: Every day | ORAL | 4 refills | Status: DC

## 2019-06-23 NOTE — Telephone Encounter (Signed)
I left a message regarding schedule  

## 2019-06-25 ENCOUNTER — Other Ambulatory Visit: Payer: Self-pay

## 2019-06-25 ENCOUNTER — Ambulatory Visit: Payer: 59 | Attending: Oncology | Admitting: Physical Therapy

## 2019-06-25 DIAGNOSIS — M25511 Pain in right shoulder: Secondary | ICD-10-CM | POA: Diagnosis present

## 2019-06-25 DIAGNOSIS — M25611 Stiffness of right shoulder, not elsewhere classified: Secondary | ICD-10-CM | POA: Diagnosis present

## 2019-06-25 DIAGNOSIS — M6281 Muscle weakness (generalized): Secondary | ICD-10-CM

## 2019-06-25 DIAGNOSIS — R293 Abnormal posture: Secondary | ICD-10-CM | POA: Insufficient documentation

## 2019-06-25 DIAGNOSIS — I890 Lymphedema, not elsewhere classified: Secondary | ICD-10-CM | POA: Diagnosis present

## 2019-06-25 DIAGNOSIS — R296 Repeated falls: Secondary | ICD-10-CM | POA: Diagnosis present

## 2019-06-25 DIAGNOSIS — L599 Disorder of the skin and subcutaneous tissue related to radiation, unspecified: Secondary | ICD-10-CM | POA: Diagnosis present

## 2019-06-25 DIAGNOSIS — Z483 Aftercare following surgery for neoplasm: Secondary | ICD-10-CM

## 2019-06-25 NOTE — Therapy (Signed)
[place], [place], [address] Phone: [phone]   Fax:  [phone]  Physical Therapy Evaluation  Patient Details  Name: Autumn Cook MRN: [id number] Date of Birth: 07-Apr-1961 Referring Provider (PT): [name]    Encounter Date: 06/25/2019  PT End of Session - 06/25/19 1949    Visit Number  1    Number of Visits  17    Date for PT Re-Evaluation  08/25/19    PT Start Time  1500    PT Stop Time  1545    PT Time Calculation (min)  45 min    Activity Tolerance  Patient tolerated treatment well    Behavior During Therapy  [facility] for tasks assessed/performed       Past Medical History:  Diagnosis Date  . Allergy   . Arthritis   . Asthma   . Cancer [facility])    R breast cancer  . Diabetes mellitus without complication (HCC)    no meds now  . Family history of adverse reaction to anesthesia    "entire family takes a long time to wake up- N&V for several days"  . Family history of lung cancer   . Hypertension   . Migraine   . PONV (postoperative nausea and vomiting)     Past Surgical History:  Procedure Laterality Date  . BREAST CYST EXCISION  1994  . BREAST LUMPECTOMY WITH RADIOACTIVE SEED AND SENTINEL LYMPH NODE BIOPSY Right 12/27/2018   Procedure: RIGHT BREAST LUMPECTOMY WITH RADIOACTIVE SEED X2 AND RIGHT SEED TARGETED AXILLARY SENTINEL LYMPH NODE BIOPSY;  Surgeon: Alphonsa Overall, MD;  Location: [place];  Service: General;  Laterality: Right;  . DILATION AND CURETTAGE OF UTERUS    . PORTACATH PLACEMENT Right 08/01/2018   Procedure: INSERTION PORT-A-CATH WITH ULTRASOUND;  Surgeon: Alphonsa Overall, MD;  Location: [place];  Service: General;  Laterality: Right;    There were no vitals filed for this visit.   Subjective Assessment - 06/25/19 1507    Subjective  Pt has been having pain in right armpit and stabbing pain down the arm.  She is also concerned as she  has been having falls and near falls where her daughter has to catch her with no dizziness or warning it will happen    Pertinent History  2 sites of cancer in right breast breast cancer diagnosed in Feb 2020 with neo adjuvant chemo with CIPN numbness in feet, right lumpectomy at 2 sites in right breast with 5 sentinel nodes in August 2020, followed by radiation    Patient Stated Goals  had minimal pain and better ability to use right side    Currently in Pain?  Yes    Pain Score  7     Pain Location  Arm    Pain Orientation  Right    Pain Descriptors / Indicators  Shooting;Stabbing   feels puffy underneath   Pain Type  Acute pain    Pain Radiating Towards  down her right arm    Pain Onset  More than a month ago    Pain Frequency  Constant    Aggravating Factors   lean on right arm andn sleeping    Pain Relieving Factors  tylenol    Effect of Pain on Daily Activities  can't do household activities         [facility] PT Assessment - 06/25/19 0001      Assessment   Medical Diagnosis  right breast cancer     Referring Provider (PT)  [name]     Onset Date/Surgical Date  12/27/18    Hand Dominance  Left      Precautions   Precautions  Fall    Precaution Comments  at risk for lymphedema       Restrictions   Weight Bearing Restrictions  No      Balance Screen   Has the patient fallen in the past 6 months  Yes   loses balance all of a sudden    How many times?  2+    she has "almost fallen " about 3-4 times a week.     Has the patient had a decrease in activity level because of a fear of falling?   Yes    Is the patient reluctant to leave their home because of a fear of falling?   No      Home Film/video editor residence    Living Arrangements  Children   daughter    Available Help at Discharge  Available 24 hours/day    Type of Home  House      Prior Function   Level of Independence  Independent    Vocation  Full time employment    Vocation  Requirements  real estate agent     Leisure  joined [employer] and daughter will go with her       Cognition   Overall Cognitive Status  Within Functional Limits for tasks assessed      Observation/Other Assessments   Observations  pt has congestion in right breast with enlarged pores especially in inner lower quadrant of breast.  She also has visible tightness in right axilla with well healed scars     Other Surveys   Quick Dash   lymphedema life impact scale 48.53   Quick DASH   65.91      Sensation   Additional Comments  pt reports numbness and pain in posterior right upper arm       Coordination   Gross Motor Movements are Fluid and Coordinated  No   limited by pain in right axilla and UE     Functional Tests   Functional tests  Sit to Stand      Sit to Stand   Comments  8 reps in 30 seconds ( causes pain in right axilla and arm )    pt moves slowly and with effort does not use arms to push up     Posture/Postural Control   Posture/Postural Control  Postural limitations    Postural Limitations  Rounded Shoulders;Forward head      ROM / Strength   AROM / PROM / Strength  AROM;Strength      AROM   Overall AROM   Deficits    Overall AROM Comments  pt has decreased scapular stability on the right with overhead elevation     AROM Assessment Site  Shoulder    Right/Left Shoulder  Right;Left    Right Shoulder Extension  64 Degrees    Right Shoulder Flexion  128 Degrees    Right Shoulder ABduction  137 Degrees    Right Shoulder External Rotation  90 Degrees    Left Shoulder Extension  60 Degrees    Left Shoulder Flexion  154 Degrees    Left Shoulder ABduction  160 Degrees    Left Shoulder External Rotation  90 Degrees      Strength  Overall Strength  Deficits    Overall Strength Comments  decreased strength to 3-/5 on right shoulder due to pain  decreased general strength of LEs    Right/Left hand  Right;Left    Right Hand Grip (lbs)  30/4042    Left Hand Grip  (lbs)  45/45/50      Palpation   Palpation comment  tender firmness in right chest and breast especially on right lower quadrant  tight tender upper trap and shoulder muscles on right       Ambulation/Gait   Gait Comments  Pt states she has frequent falls and near falls with no dizziness, and no warning.       Standardized Balance Assessment   Standardized Balance Assessment  Timed Up and Go Test      Timed Up and Go Test   Normal TUG (seconds)  8.68        LYMPHEDEMA/ONCOLOGY QUESTIONNAIRE - 06/25/19 1531      Right Upper Extremity Lymphedema   10 cm Proximal to Olecranon Process  28.3 cm    Olecranon Process  26 cm    15 cm Proximal to Ulnar Styloid Process  23 cm    Just Proximal to Ulnar Styloid Process  16.5 cm    Across Hand at [employer]  20.7 cm    At [place] of 2nd Digit  6.5 cm      Left Upper Extremity Lymphedema   10 cm Proximal to Olecranon Process  29.5 cm    Olecranon Process  25.5 cm    15 cm Proximal to Ulnar Styloid Process  24 cm    Just Proximal to Ulnar Styloid Process  16.8 cm    Across Hand at [employer]  20.5 cm    At [place] of 2nd Digit  6.2 cm          Quick Dash - 06/25/19 0001    Open a tight or new jar  Unable    Do heavy household chores (wash walls, wash floors)  Moderate difficulty    Carry a shopping bag or briefcase  Moderate difficulty    Wash your back  Severe difficulty    Use a knife to cut food  Moderate difficulty    Recreational activities in which you take some force or impact through your arm, shoulder, or hand (golf, hammering, tennis)  Moderate difficulty    During the past week, to what extent has your arm, shoulder or hand problem interfered with your normal social activities with family, friends, neighbors, or groups?  Modererately    During the past week, to what extent has your arm, shoulder or hand problem limited your work or other regular daily activities  Quite a bit    Arm, shoulder, or hand pain.  Moderate     Tingling (pins and needles) in your arm, shoulder, or hand  Severe    Difficulty Sleeping  So much difficuSo much difficulty, I can't sleep    DASH Score  65.91 %        Outpatient Re[facility]  Lymphedema Life Impact Scale Total Score  44.12 %      Objective measurements completed on examination: See above findings.      [facility] Adult PT Treatment/Exercise - 06/25/19 0001      Self-Care   Self-Care  Other Self-Care Comments    Other Self-Care Comments   pt given information about where to get a compression  bra and inbasket sent to Bary Castilla to [facility] script to [facility]                PT Short Term Goals - 06/25/19 1957      PT SHORT TERM GOAL #1   Title  Pt will report that she can manage her right breast lymphedema with self MLD and compression    Time  4    Period  Weeks    Status  New      PT SHORT TERM GOAL #2   Title  Pt will be independent in HEP for right shoulder    Time  4    Period  Weeks    Status  New      PT SHORT TERM GOAL #3   Title  Pt will report the pain in her right shoulder is decreased to 3/10    Baseline  6/10 on 06/25/2019    Status  New        PT Long Term Goals - 06/25/19 1958      PT LONG TERM GOAL #1   Title  Pt will have 150 degrees of painfree right shoulder flexion    Baseline  128 on 06/25/2019    Time  8    Period  Weeks    Status  New      PT LONG TERM GOAL #2   Title  Pt will decrease Quick DASH score to < 45 indicating an improvment in UE function    Baseline  65.91 on 06/25/2019    Time  8    Period  Weeks    Status  New      PT LONG TERM GOAL #3   Title  Pt will increase repeated sit to stand to 10 in 30 seconds indicating an improvment in general strength    Baseline  8 in 30 seconds on 06/25/2019    Time  8    Period  Weeks    Status  New             Plan - 06/25/19 1950    Clinical Impression Statement  Pt presents to PT with  lymphedema in right breast and very painful right axilla and arm. She has very tight soft tissue neck shoulders and pec major area.  She will benefit from manual lymph drainage and compression to this area and soft tissue work to neck and shoulders in addition to exercise to her shoulder.  She is also having diffuclty with balance and falls and lower extremity general strength    Personal Factors and Comorbidities  Comorbidity 3+    Comorbidities  breast surgery, chemotherapy with CIPN, radiation    Examination-Activity Limitations  Reach Overhead    Stability/Clinical Decision Making  Stable/Uncomplicated    Clinical Decision Making  Low    Rehab Potential  Good    PT Frequency  2x / week    PT Duration  4 weeks    PT Treatment/Interventions  ADLs/Self Care Home Management;Therapeutic activities;Therapeutic exercise;Balance training;Neuromuscular re-education;Orthotic Fit/Training;Patient/family education;Manual techniques;Manual lymph drainage;Compression bandaging;Taping;Dry needling;Passive range of motion    PT Next Visit Plan  Begin [place] lymph drainage to right breast, soft tissue work to neck and shoudlers,exercise to right shoulder with PROM, check to see if pt got compression bra  progress to LE strength and balance work    Oncologist with Plan of Care  Patient       Patient will benefit from skilled  therapeutic intervention in order to improve the following deficits and impairments:  Decreased balance, Decreased endurance, Impaired perceived functional ability, Increased edema, Decreased range of motion, Decreased knowledge of precautions, Decreased strength, Increased fascial restricitons, Impaired UE functional use, Postural dysfunction, Pain  Visit Diagnosis: Aftercare following surgery for neoplasm - Plan: PT plan of care cert/re-cert  Disorder of the skin and subcutaneous tissue related to radiation, unspecified - Plan: PT plan of care cert/re-cert  Lymphedema, not  elsewhere classified - Plan: PT plan of care cert/re-cert  Stiffness of right shoulder, not elsewhere classified - Plan: PT plan of care cert/re-cert  Acute pain of right shoulder - Plan: PT plan of care cert/re-cert  Muscle weakness (generalized) - Plan: PT plan of care cert/re-cert  Repeated falls - Plan: PT plan of care cert/re-cert     Problem List Patient Active Problem List   Diagnosis Date Noted  . Genetic testing 08/13/2018  . Port-A-Cath in place 08/07/2018  . Family history of lung cancer   . Malignant neoplasm of overlapping sites of right breast in female, estrogen receptor positive ([place]) 07/23/2018  . Uncontrolled diabetes mellitus type 2 without complications 123456  . Achilles tendonitis, bilateral 06/11/2015  . Chest pain with moderate risk for cardiac etiology 10/13/2014  . Hypercholesterolemia 10/13/2014  . Idiopathic scoliosis 08/18/2014  . Hypertension 09/11/2012  . Overweight (BMI 25.0-29.9) 09/11/2012  . Migraine headache 09/11/2012  . Hx of thyroid nodule 09/11/2012  . History of tobacco use 09/11/2012   Maudry Diego, PT 06/25/19 8:03 PM  Norwood Levo 06/25/2019, 8:03 PM  [place], [place], [address] Phone: [phone]   Fax:  [phone]  Name: Autumn Cook MRN: [id number] Date of Birth: 08-Oct-1960

## 2019-06-25 NOTE — Patient Instructions (Signed)
First of all, check with your insurance company to see if provider is in network    [facility] (for wigs and compression sleeves / gloves/gauntlets )  [address] [place], [place] [address] [phone]  Will file some insurances --- call for appointment   [facility] (for mastectomy prosthetics and garments) [address] [place], [place] [address] [phone] Will file some insurances --- call for appointment  [facility]  [address]  [place], [place] [address] [phone] Lower extremity garments  [facility] [address] [place], [place]  [address] [phone]  Cathy Rubel ( Medicaid certified lymphedema fitter) [phone] [email or web address]  Melissa Meares  [facility]  [phone]  [facility] [address] [place], [place] [address] [phone]  Other Resources: National Lymphedema Network:  www.lymphnet.org www.Klosetraining.com for patient articles and self manual lymph drainage information www.lymphedemablog.com has informative articles.  www.compressionguru.com www.lymphedemaproducts.com www.brightlifedirect.com www.compressionguru.com 

## 2019-06-30 ENCOUNTER — Ambulatory Visit: Payer: 59

## 2019-06-30 ENCOUNTER — Other Ambulatory Visit: Payer: Self-pay

## 2019-06-30 DIAGNOSIS — L599 Disorder of the skin and subcutaneous tissue related to radiation, unspecified: Secondary | ICD-10-CM

## 2019-06-30 DIAGNOSIS — Z483 Aftercare following surgery for neoplasm: Secondary | ICD-10-CM | POA: Diagnosis not present

## 2019-06-30 DIAGNOSIS — M6281 Muscle weakness (generalized): Secondary | ICD-10-CM

## 2019-06-30 DIAGNOSIS — M25611 Stiffness of right shoulder, not elsewhere classified: Secondary | ICD-10-CM

## 2019-06-30 DIAGNOSIS — M25511 Pain in right shoulder: Secondary | ICD-10-CM

## 2019-06-30 DIAGNOSIS — R296 Repeated falls: Secondary | ICD-10-CM

## 2019-06-30 DIAGNOSIS — I890 Lymphedema, not elsewhere classified: Secondary | ICD-10-CM

## 2019-06-30 NOTE — Therapy (Signed)
[place], [place], [address] Phone: [phone]   Fax:  [phone]  Physical Therapy Treatment  Patient Details  Name: ERETRIA DONALDS MRN: [id number] Date of Birth: 02-02-1961 Referring Provider (PT): [name]    Encounter Date: 06/30/2019  PT End of Session - 06/30/19 0905    Visit Number  2    Number of Visits  17    Date for PT Re-Evaluation  08/25/19    PT Start Time  0805    PT Stop Time  0902    PT Time Calculation (min)  57 min    Activity Tolerance  Patient tolerated treatment well    Behavior During Therapy  [facility] for tasks assessed/performed       Past Medical History:  Diagnosis Date  . Allergy   . Arthritis   . Asthma   . Cancer [facility])    R breast cancer  . Diabetes mellitus without complication (HCC)    no meds now  . Family history of adverse reaction to anesthesia    "entire family takes a long time to wake up- N&V for several days"  . Family history of lung cancer   . Hypertension   . Migraine   . PONV (postoperative nausea and vomiting)     Past Surgical History:  Procedure Laterality Date  . BREAST CYST EXCISION  1994  . BREAST LUMPECTOMY WITH RADIOACTIVE SEED AND SENTINEL LYMPH NODE BIOPSY Right 12/27/2018   Procedure: RIGHT BREAST LUMPECTOMY WITH RADIOACTIVE SEED X2 AND RIGHT SEED TARGETED AXILLARY SENTINEL LYMPH NODE BIOPSY;  Surgeon: Alphonsa Overall, MD;  Location: [place];  Service: General;  Laterality: Right;  . DILATION AND CURETTAGE OF UTERUS    . PORTACATH PLACEMENT Right 08/01/2018   Procedure: INSERTION PORT-A-CATH WITH ULTRASOUND;  Surgeon: Alphonsa Overall, MD;  Location: [place];  Service: General;  Laterality: Right;    There were no vitals filed for this visit.  Subjective Assessment - 06/30/19 0806    Subjective  I feel about the same since evaluation.    Pertinent History  2 sites of cancer in right breast breast  cancer diagnosed in Feb 2020 with neo adjuvant chemo with CIPN numbness in feet, right lumpectomy at 2 sites in right breast with 5 sentinel nodes in August 2020, followed by radiation    Patient Stated Goals  had minimal pain and better ability to use right side    Currently in Pain?  No/denies                  Outpatient Rehab from [facility]25/2019 [facility]  Lymphedema Life Impact Scale Total Score  44.12 %           [facility] Adult PT Treatment/Exercise - 06/30/19 0001      Manual Therapy   Manual Therapy  Manual Lymphatic Drainage (MLD)    Manual Lymphatic Drainage (MLD)  In Supine: Supraclavicular fossa, bil shoulder collectors, superficial and deep abdominals, Rt inguinal and Lt axillary nodes, Rt axillo-inguinal anastomosis, then Lt intact upper anterior quadrant sequence and anterior inter-axillary anastomosis, then focused on superior and medial Rt breast redirecting towards anterior inter-axillary anastomosis, then in to Lt S/L for further work along posterior inter-axillary and added posterior inter-axillary anastomosis and focused on lateral and inferior aspects of breast, then finished in supine retracing initial anastomosis.                PT  Short Term Goals - 06/25/19 1957      PT SHORT TERM GOAL #1   Title  Pt will report that she can manage her right breast lymphedema with self MLD and compression    Time  4    Period  Weeks    Status  New      PT SHORT TERM GOAL #2   Title  Pt will be independent in HEP for right shoulder    Time  4    Period  Weeks    Status  New      PT SHORT TERM GOAL #3   Title  Pt will report the pain in her right shoulder is decreased to 3/10    Baseline  6/10 on 06/25/2019    Status  New        PT Long Term Goals - 06/25/19 1958      PT LONG TERM GOAL #1   Title  Pt will have 150 degrees of painfree right shoulder flexion    Baseline  128 on 06/25/2019    Time  8    Period  Weeks     Status  New      PT LONG TERM GOAL #2   Title  Pt will decrease Quick DASH score to < 45 indicating an improvment in UE function    Baseline  65.91 on 06/25/2019    Time  8    Period  Weeks    Status  New      PT LONG TERM GOAL #3   Title  Pt will increase repeated sit to stand to 10 in 30 seconds indicating an improvment in general strength    Baseline  8 in 30 seconds on 06/25/2019    Time  8    Period  Weeks    Status  New            Plan - 06/30/19 1212    Clinical Impression Statement  Began manual lymph drainage of Rt breast today which pt tolerated very well.Good softening was noted at medial aspect of lower breast when in supine, and then same noted for latera breast when in S/L. She reported noting some increased soreness after session but not painfully so. Instructed her in priniciples of MLD today along with basics of anatomy of the lymphatic system. Overall pt did well with inital session but held off in issuing foam as she reported soreness after MLD. Will try this at next session.    Personal Factors and Comorbidities  Comorbidity 3+    Comorbidities  breast surgery, chemotherapy with CIPN, radiation    Examination-Activity Limitations  Reach Overhead    Stability/Clinical Decision Making  Stable/Uncomplicated    Rehab Potential  Good    PT Frequency  2x / week    PT Duration  4 weeks    PT Treatment/Interventions  ADLs/Self Care Home Management;Therapeutic activities;Therapeutic exercise;Balance training;Neuromuscular re-education;Orthotic Fit/Training;Patient/family education;Manual techniques;Manual lymph drainage;Compression bandaging;Taping;Dry needling;Passive range of motion    PT Next Visit Plan  Issue 1/4" gray foam for trial, if tolerates well can trip chip pack or 1/2" gray foam. Cont Manual lymph drainage to right breast, add soft tissue work to neck and shoudlers,exercise to right shoulder with PROM, check to see if pt got compression bra  progress to LE  strength and balance work    Oncologist with Plan of Care  Patient       Patient will benefit from skilled therapeutic intervention in  order to improve the following deficits and impairments:  Decreased balance, Decreased endurance, Impaired perceived functional ability, Increased edema, Decreased range of motion, Decreased knowledge of precautions, Decreased strength, Increased fascial restricitons, Impaired UE functional use, Postural dysfunction, Pain  Visit Diagnosis: Aftercare following surgery for neoplasm  Disorder of the skin and subcutaneous tissue related to radiation, unspecified  Lymphedema, not elsewhere classified  Stiffness of right shoulder, not elsewhere classified  Acute pain of right shoulder  Muscle weakness (generalized)  Repeated falls     Problem List Patient Active Problem List   Diagnosis Date Noted  . Genetic testing 08/13/2018  . Port-A-Cath in place 08/07/2018  . Family history of lung cancer   . Malignant neoplasm of overlapping sites of right breast in female, estrogen receptor positive ([place]) 07/23/2018  . Uncontrolled diabetes mellitus type 2 without complications 123456  . Achilles tendonitis, bilateral 06/11/2015  . Chest pain with moderate risk for cardiac etiology 10/13/2014  . Hypercholesterolemia 10/13/2014  . Idiopathic scoliosis 08/18/2014  . Hypertension 09/11/2012  . Overweight (BMI 25.0-29.9) 09/11/2012  . Migraine headache 09/11/2012  . Hx of thyroid nodule 09/11/2012  . History of tobacco use 09/11/2012    Otelia Limes, PTA 06/30/2019, 12:28 PM  [place], [place], [address] Phone: [phone]   Fax:  [phone]  Name: YETTA OKE MRN: [id number] Date of Birth: 04/23/1961

## 2019-07-02 ENCOUNTER — Ambulatory Visit: Payer: 59 | Admitting: Physical Therapy

## 2019-07-02 ENCOUNTER — Other Ambulatory Visit: Payer: Self-pay

## 2019-07-02 ENCOUNTER — Encounter: Payer: Self-pay | Admitting: Physical Therapy

## 2019-07-02 DIAGNOSIS — L599 Disorder of the skin and subcutaneous tissue related to radiation, unspecified: Secondary | ICD-10-CM

## 2019-07-02 DIAGNOSIS — Z483 Aftercare following surgery for neoplasm: Secondary | ICD-10-CM | POA: Diagnosis not present

## 2019-07-02 DIAGNOSIS — I890 Lymphedema, not elsewhere classified: Secondary | ICD-10-CM

## 2019-07-02 DIAGNOSIS — M6281 Muscle weakness (generalized): Secondary | ICD-10-CM

## 2019-07-02 DIAGNOSIS — M25611 Stiffness of right shoulder, not elsewhere classified: Secondary | ICD-10-CM

## 2019-07-02 DIAGNOSIS — M25511 Pain in right shoulder: Secondary | ICD-10-CM

## 2019-07-02 DIAGNOSIS — R293 Abnormal posture: Secondary | ICD-10-CM

## 2019-07-02 NOTE — Therapy (Signed)
[place], [place], [address] Phone: [phone]   Fax:  [phone]  Physical Therapy Treatment  Patient Details  Name: Autumn Cook MRN: [id number] Date of Birth: 08-16-1960 Referring Provider (PT): [name]    Encounter Date: 07/02/2019  PT End of Session - 07/02/19 1250    Visit Number  3    Number of Visits  17    Date for PT Re-Evaluation  08/25/19    PT Start Time  1200    PT Stop Time  1245    PT Time Calculation (min)  45 min    Activity Tolerance  Patient tolerated treatment well    Behavior During Therapy  [facility] for tasks assessed/performed       Past Medical History:  Diagnosis Date  . Allergy   . Arthritis   . Asthma   . Cancer [facility])    R breast cancer  . Diabetes mellitus without complication (HCC)    no meds now  . Family history of adverse reaction to anesthesia    "entire family takes a long time to wake up- N&V for several days"  . Family history of lung cancer   . Hypertension   . Migraine   . PONV (postoperative nausea and vomiting)     Past Surgical History:  Procedure Laterality Date  . BREAST CYST EXCISION  1994  . BREAST LUMPECTOMY WITH RADIOACTIVE SEED AND SENTINEL LYMPH NODE BIOPSY Right 12/27/2018   Procedure: RIGHT BREAST LUMPECTOMY WITH RADIOACTIVE SEED X2 AND RIGHT SEED TARGETED AXILLARY SENTINEL LYMPH NODE BIOPSY;  Surgeon: Alphonsa Overall, MD;  Location: [place];  Service: General;  Laterality: Right;  . DILATION AND CURETTAGE OF UTERUS    . PORTACATH PLACEMENT Right 08/01/2018   Procedure: INSERTION PORT-A-CATH WITH ULTRASOUND;  Surgeon: Alphonsa Overall, MD;  Location: [place];  Service: General;  Laterality: Right;    There were no vitals filed for this visit.  Subjective Assessment - 07/02/19 1245    Subjective  Pt has a little tenderness in her right shoulde this morning as she was doing exercise    Pertinent  History  2 sites of cancer in right breast breast cancer diagnosed in Feb 2020 with neo adjuvant chemo with CIPN numbness in feet, right lumpectomy at 2 sites in right breast with 5 sentinel nodes in August 2020, followed by radiation    Patient Stated Goals  had minimal pain and better ability to use right side    Currently in Pain?  Yes    Pain Score  2     Pain Location  Shoulder    Pain Orientation  Right    Pain Descriptors / Indicators  Tender    Pain Type  Chronic pain                  Outpatient Re[facility]  Lymphedema Life Impact Scale Total Score  44.12 %           [facility] Adult PT Treatment/Exercise - 07/02/19 0001      Exercises   Exercises  Other Exercises    Other Exercises   neck and upper thoracic ROM, shoulder rolls with deep breathing.  Pt with pain in right shoulder       Manual Therapy   Manual Therapy  Edema management;Manual Lymphatic Drainage (MLD)    Manual therapy comments  began instruction in self MLD for diaphragmatic  breaths and abdomen wiht hold on firm areas of breast using mirror for visual cues of skin stretch     Edema Management  issued 2 pieces of small dotted foam one each to medial and lateral breast to wear inside bra      Manual Lymphatic Drainage (MLD)  In Supine: Supraclavicular fossa, bil shoulder collectors, superficial and deep abdominals, Rt inguinal and Lt axillary nodes, Rt axillo-inguinal anastomosis, then Lt intact upper anterior quadrant sequence and anterior inter-axillary anastomosis, then focused on superior and medial Rt breast redirecting towards anterior inter-axillary anastomosis, then in to Lt S/L for further work along posterior inter-axillary and added posterior inter-axillary anastomosis and focused on lateral and inferior aspects of breast, then finished in supine retracing initial anastomosis.              PT Education - 07/02/19 1250    Education  Details  self manua lymph drainage.    Person(s) Educated  Patient    Methods  Explanation;Tactile cues;Verbal cues;Demonstration    Comprehension  Need further instruction       PT Short Term Goals - 06/25/19 1957      PT SHORT TERM GOAL #1   Title  Pt will report that she can manage her right breast lymphedema with self MLD and compression    Time  4    Period  Weeks    Status  New      PT SHORT TERM GOAL #2   Title  Pt will be independent in HEP for right shoulder    Time  4    Period  Weeks    Status  New      PT SHORT TERM GOAL #3   Title  Pt will report the pain in her right shoulder is decreased to 3/10    Baseline  6/10 on 06/25/2019    Status  New        PT Long Term Goals - 06/25/19 1958      PT LONG TERM GOAL #1   Title  Pt will have 150 degrees of painfree right shoulder flexion    Baseline  128 on 06/25/2019    Time  8    Period  Weeks    Status  New      PT LONG TERM GOAL #2   Title  Pt will decrease Quick DASH score to < 45 indicating an improvment in UE function    Baseline  65.91 on 06/25/2019    Time  8    Period  Weeks    Status  New      PT LONG TERM GOAL #3   Title  Pt will increase repeated sit to stand to 10 in 30 seconds indicating an improvment in general strength    Baseline  8 in 30 seconds on 06/25/2019    Time  8    Period  Weeks    Status  New              Patient will benefit from skilled therapeutic intervention in order to improve the following deficits and impairments:     Visit Diagnosis: Aftercare following surgery for neoplasm  Disorder of the skin and subcutaneous tissue related to radiation, unspecified  Lymphedema, not elsewhere classified  Stiffness of right shoulder, not elsewhere classified  Acute pain of right shoulder  Muscle weakness (generalized)  Abnormal posture     Problem List Patient Active Problem List   Diagnosis Date Noted  . Genetic  testing 08/13/2018  . Port-A-Cath in place  08/07/2018  . Family history of lung cancer   . Malignant neoplasm of overlapping sites of right breast in female, estrogen receptor positive ([place]) 07/23/2018  . Uncontrolled diabetes mellitus type 2 without complications 123456  . Achilles tendonitis, bilateral 06/11/2015  . Chest pain with moderate risk for cardiac etiology 10/13/2014  . Hypercholesterolemia 10/13/2014  . Idiopathic scoliosis 08/18/2014  . Hypertension 09/11/2012  . Overweight (BMI 25.0-29.9) 09/11/2012  . Migraine headache 09/11/2012  . Hx of thyroid nodule 09/11/2012  . History of tobacco use 09/11/2012   Donato Heinz. Owens Shark PT  Norwood Levo 07/02/2019, 12:51 PM  [place], [place], [address] Phone: [phone]   Fax:  [phone]  Name: Autumn Cook MRN: [id number] Date of Birth: Oct 28, 1960

## 2019-07-07 ENCOUNTER — Other Ambulatory Visit: Payer: Self-pay

## 2019-07-07 ENCOUNTER — Ambulatory Visit: Payer: 59 | Attending: Oncology | Admitting: Physical Therapy

## 2019-07-07 ENCOUNTER — Encounter: Payer: Self-pay | Admitting: Physical Therapy

## 2019-07-07 DIAGNOSIS — C50811 Malignant neoplasm of overlapping sites of right female breast: Secondary | ICD-10-CM | POA: Insufficient documentation

## 2019-07-07 DIAGNOSIS — M25511 Pain in right shoulder: Secondary | ICD-10-CM

## 2019-07-07 DIAGNOSIS — R293 Abnormal posture: Secondary | ICD-10-CM

## 2019-07-07 DIAGNOSIS — M25611 Stiffness of right shoulder, not elsewhere classified: Secondary | ICD-10-CM | POA: Diagnosis present

## 2019-07-07 DIAGNOSIS — M6281 Muscle weakness (generalized): Secondary | ICD-10-CM | POA: Diagnosis present

## 2019-07-07 DIAGNOSIS — L599 Disorder of the skin and subcutaneous tissue related to radiation, unspecified: Secondary | ICD-10-CM | POA: Insufficient documentation

## 2019-07-07 DIAGNOSIS — Z483 Aftercare following surgery for neoplasm: Secondary | ICD-10-CM | POA: Diagnosis not present

## 2019-07-07 DIAGNOSIS — I890 Lymphedema, not elsewhere classified: Secondary | ICD-10-CM | POA: Diagnosis present

## 2019-07-07 DIAGNOSIS — Z170 Estrogen receptor positive status [ER+]: Secondary | ICD-10-CM | POA: Diagnosis present

## 2019-07-07 DIAGNOSIS — R296 Repeated falls: Secondary | ICD-10-CM | POA: Insufficient documentation

## 2019-07-07 NOTE — Therapy (Signed)
[place], [place], [address] Phone: [phone]   Fax:  [phone]  Physical Therapy Treatment  Patient Details  Name: Autumn Cook MRN: [id number] Date of Birth: [birth year] Referring Provider (PT): [name]    Encounter Date: 07/07/2019  PT End of Session - 07/07/19 1214    Visit Number  4    Number of Visits  17    Date for PT Re-Evaluation  08/25/19    PT Start Time  1010    PT Stop Time  1058    PT Time Calculation (min)  48 min    Activity Tolerance  Patient tolerated treatment well    Behavior During Therapy  [facility] for tasks assessed/performed       Past Medical History:  Diagnosis Date  . Allergy   . Arthritis   . Asthma   . Cancer [facility])    R breast cancer  . Diabetes mellitus without complication (HCC)    no meds now  . Family history of adverse reaction to anesthesia    "entire family takes a long time to wake up- N&V for several days"  . Family history of lung cancer   . Hypertension   . Migraine   . PONV (postoperative nausea and vomiting)     Past Surgical History:  Procedure Laterality Date  . BREAST CYST EXCISION  1994  . BREAST LUMPECTOMY WITH RADIOACTIVE SEED AND SENTINEL LYMPH NODE BIOPSY Right 12/27/2018   Procedure: RIGHT BREAST LUMPECTOMY WITH RADIOACTIVE SEED X2 AND RIGHT SEED TARGETED AXILLARY SENTINEL LYMPH NODE BIOPSY;  Surgeon: Alphonsa Overall, MD;  Location: [place];  Service: General;  Laterality: Right;  . DILATION AND CURETTAGE OF UTERUS    . PORTACATH PLACEMENT Right 08/01/2018   Procedure: INSERTION PORT-A-CATH WITH ULTRASOUND;  Surgeon: Alphonsa Overall, MD;  Location: [place];  Service: General;  Laterality: Right;    There were no vitals filed for this visit.  Subjective Assessment - 07/07/19 1008    Subjective  " I wish this was all over but I'm doing ok" Pt states she is having pain in her shoulder. She wants to get  started at [employer] and is agreeable to trying out out bike today    Pertinent History  2 sites of cancer in right breast breast cancer diagnosed in Feb 2020 with neo adjuvant chemo with CIPN numbness in feet, right lumpectomy at 2 sites in right breast with 5 sentinel nodes in August 2020, followed by radiation    Patient Stated Goals  had minimal pain and better ability to use right side    Currently in Pain?  Yes    Pain Score  3     Pain Location  Shoulder    Pain Orientation  Right                  Outpatient Rehab from [facility]/25/2019 [facility]  Lymphedema Life Impact Scale Total Score  44.12 %           [facility] Adult PT Treatment/Exercise - 07/07/19 0001      Exercises   Exercises  Shoulder;Lumbar      Lumbar Exercises: Aerobic   Stationary Bike  2 minutes with no resistance       Shoulder Exercises: Standing   Other Standing Exercises  UE ranger for shoulder felxion,extension and half circle       Manual Therapy   Manual Therapy  Manual Lymphatic Drainage (MLD)    Manual therapy comments  sent inbasket to [facility]illa for script to be sent to [facility]     Manual Lymphatic Drainage (MLD)  In Supine: Supraclavicular fossa, bil shoulder collectors, superficial and deep abdominals, Rt inguinal and Lt axillary nodes, Rt axillo-inguinal anastomosis, then Lt intact upper anterior quadrant sequence and anterior inter-axillary anastomosis, then focused on superior and medial Rt breast redirecting towards anterior inter-axillary anastomosis, then in to Lt S/L for further work along posterior inter-axillary and added posterior inter-axillary anastomosis and focused on lateral and inferior aspects of breast, then finished in supine retracing initial anastomosis.                PT Short Term Goals - 06/25/19 1957      PT SHORT TERM GOAL #1   Title  Pt will report that she can manage her right breast lymphedema with self  MLD and compression    Time  4    Period  Weeks    Status  New      PT SHORT TERM GOAL #2   Title  Pt will be independent in HEP for right shoulder    Time  4    Period  Weeks    Status  New      PT SHORT TERM GOAL #3   Title  Pt will report the pain in her right shoulder is decreased to 3/10    Baseline  6/10 on 06/25/2019    Status  New        PT Long Term Goals - 06/25/19 1958      PT LONG TERM GOAL #1   Title  Pt will have 150 degrees of painfree right shoulder flexion    Baseline  128 on 06/25/2019    Time  8    Period  Weeks    Status  New      PT LONG TERM GOAL #2   Title  Pt will decrease Quick DASH score to < 45 indicating an improvment in UE function    Baseline  65.91 on 06/25/2019    Time  8    Period  Weeks    Status  New      PT LONG TERM GOAL #3   Title  Pt will increase repeated sit to stand to 10 in 30 seconds indicating an improvment in general strength    Baseline  8 in 30 seconds on 06/25/2019    Time  8    Period  Weeks    Status  New            Plan - 07/07/19 1214    Clinical Impression Statement  Pt did well with beginning shoulder exercises and bike, but is limited in endurance.  Breast much softer after MLD. Hopeful she will get compression bra soon    Comorbidities  breast surgery, chemotherapy with CIPN, radiation    Stability/Clinical Decision Making  Stable/Uncomplicated    Rehab Potential  Good    PT Frequency  2x / week    PT Next Visit Plan  Cont Manual lymph drainage to right breast, add soft tissue work to neck and shoudlers,exercise to right shoulder with PROM, check to see if pt got compression bra  progress to LE strength and balance work    Recommended Other Services  send request for script for bra to [employer] and Agree with Plan of Care  Patient  Patient will benefit from skilled therapeutic intervention in order to improve the following deficits and impairments:  Decreased balance, Decreased  endurance, Impaired perceived functional ability, Increased edema, Decreased range of motion, Decreased knowledge of precautions, Decreased strength, Increased fascial restricitons, Impaired UE functional use, Postural dysfunction, Pain  Visit Diagnosis: Aftercare following surgery for neoplasm  Disorder of the skin and subcutaneous tissue related to radiation, unspecified  Lymphedema, not elsewhere classified  Stiffness of right shoulder, not elsewhere classified  Acute pain of right shoulder  Muscle weakness (generalized)  Abnormal posture     Problem List Patient Active Problem List   Diagnosis Date Noted  . Genetic testing 08/13/2018  . Port-A-Cath in place 08/07/2018  . Family history of lung cancer   . Malignant neoplasm of overlapping sites of right breast in female, estrogen receptor positive ([place]) 07/23/2018  . Uncontrolled diabetes mellitus type 2 without complications 123456  . Achilles tendonitis, bilateral 06/11/2015  . Chest pain with moderate risk for cardiac etiology 10/13/2014  . Hypercholesterolemia 10/13/2014  . Idiopathic scoliosis 08/18/2014  . Hypertension 09/11/2012  . Overweight (BMI 25.0-29.9) 09/11/2012  . Migraine headache 09/11/2012  . Hx of thyroid nodule 09/11/2012  . History of tobacco use 09/11/2012   Donato Heinz. Owens Shark PT  Norwood Levo 07/07/2019, 12:17 PM  [place], [place], [address] Phone: [phone]   Fax:  [phone]  Name: DASHAUNA SHANKLAND MRN: [id number] Date of Birth: [birth year]

## 2019-07-09 ENCOUNTER — Other Ambulatory Visit: Payer: Self-pay

## 2019-07-09 ENCOUNTER — Ambulatory Visit: Payer: 59 | Admitting: Physical Therapy

## 2019-07-09 DIAGNOSIS — M6281 Muscle weakness (generalized): Secondary | ICD-10-CM

## 2019-07-09 DIAGNOSIS — Z483 Aftercare following surgery for neoplasm: Secondary | ICD-10-CM | POA: Diagnosis not present

## 2019-07-09 DIAGNOSIS — I890 Lymphedema, not elsewhere classified: Secondary | ICD-10-CM

## 2019-07-09 DIAGNOSIS — M25611 Stiffness of right shoulder, not elsewhere classified: Secondary | ICD-10-CM

## 2019-07-09 DIAGNOSIS — M25511 Pain in right shoulder: Secondary | ICD-10-CM

## 2019-07-09 NOTE — Therapy (Signed)
[place], [place], [address] Phone: [phone]   Fax:  [phone]  Physical Therapy Treatment  Patient Details  Name: Autumn Cook MRN: [id number] Date of Birth: 1961-04-29 Referring Provider (PT): [name]    Encounter Date: 07/09/2019  PT End of Session - 07/09/19 1451    Visit Number  5    Number of Visits  17    Date for PT Re-Evaluation  08/25/19    PT Start Time  1400    PT Stop Time  1445    PT Time Calculation (min)  45 min    Activity Tolerance  Patient tolerated treatment well    Behavior During Therapy  [facility] for tasks assessed/performed       Past Medical History:  Diagnosis Date  . Allergy   . Arthritis   . Asthma   . Cancer [facility])    R breast cancer  . Diabetes mellitus without complication (HCC)    no meds now  . Family history of adverse reaction to anesthesia    "entire family takes a long time to wake up- N&V for several days"  . Family history of lung cancer   . Hypertension   . Migraine   . PONV (postoperative nausea and vomiting)     Past Surgical History:  Procedure Laterality Date  . BREAST CYST EXCISION  1994  . BREAST LUMPECTOMY WITH RADIOACTIVE SEED AND SENTINEL LYMPH NODE BIOPSY Right 12/27/2018   Procedure: RIGHT BREAST LUMPECTOMY WITH RADIOACTIVE SEED X2 AND RIGHT SEED TARGETED AXILLARY SENTINEL LYMPH NODE BIOPSY;  Surgeon: Alphonsa Overall, MD;  Location: [place];  Service: General;  Laterality: Right;  . DILATION AND CURETTAGE OF UTERUS    . PORTACATH PLACEMENT Right 08/01/2018   Procedure: INSERTION PORT-A-CATH WITH ULTRASOUND;  Surgeon: Alphonsa Overall, MD;  Location: [place];  Service: General;  Laterality: Right;    There were no vitals filed for this visit.  Subjective Assessment - 07/09/19 1401    Subjective  Pt is trying to regulate her blood sugar today and does not want to exercise.  She has not been able to get  her compression bra because she will have to pay 100% as she has not met her deductible. We have some donated bras that will work for her She feels that her breast is softer and feels better    Pertinent History  2 sites of cancer in right breast breast cancer diagnosed in Feb 2020 with neo adjuvant chemo with CIPN numbness in feet, right lumpectomy at 2 sites in right breast with 5 sentinel nodes in August 2020, followed by radiation    Patient Stated Goals  had minimal pain and better ability to use right side    Currently in Pain?  Yes    Pain Score  3     Pain Location  Axilla    Pain Orientation  Right   its a little pinchy                 Outpatient Rehab from [facility]/25/2019 [facility]  Lymphedema Life Impact Scale Total Score  44.12 %           [facility] Adult PT Treatment/Exercise - 07/09/19 0001      Manual Therapy   Manual Therapy  Manual Lymphatic Drainage (MLD)    Manual therapy comments  small dotted foam to wear inside compression bra at lateral scar area  Manual Lymphatic Drainage (MLD)  In Supine: Supraclavicular fossa, bil shoulder collectors, superficial and deep abdominals, Rt inguinal and Lt axillary nodes, Rt axillo-inguinal anastomosis, then Lt intact upper anterior quadrant sequence and anterior inter-axillary anastomosis, then focused on superior and medial Rt breast redirecting towards anterior inter-axillary anastomosis, then in to Lt S/L for further work along posterior inter-axillary and added posterior inter-axillary anastomosis and focused on lateral and inferior aspects of breast, then finished in supine retracing initial anastomosis.                PT Short Term Goals - 06/25/19 1957      PT SHORT TERM GOAL #1   Title  Pt will report that she can manage her right breast lymphedema with self MLD and compression    Time  4    Period  Weeks    Status  New      PT SHORT TERM GOAL #2   Title  Pt will be  independent in HEP for right shoulder    Time  4    Period  Weeks    Status  New      PT SHORT TERM GOAL #3   Title  Pt will report the pain in her right shoulder is decreased to 3/10    Baseline  6/10 on 06/25/2019    Status  New        PT Long Term Goals - 06/25/19 1958      PT LONG TERM GOAL #1   Title  Pt will have 150 degrees of painfree right shoulder flexion    Baseline  128 on 06/25/2019    Time  8    Period  Weeks    Status  New      PT LONG TERM GOAL #2   Title  Pt will decrease Quick DASH score to < 45 indicating an improvment in UE function    Baseline  65.91 on 06/25/2019    Time  8    Period  Weeks    Status  New      PT LONG TERM GOAL #3   Title  Pt will increase repeated sit to stand to 10 in 30 seconds indicating an improvment in general strength    Baseline  8 in 30 seconds on 06/25/2019    Time  8    Period  Weeks    Status  New            Plan - 07/09/19 1451    Clinical Impression Statement  Pt feels her breast is much softer since Monday especailly at medial and inferior area of breast.  She still has a full tender area at lateral area at incision.  Hopefull this will soften with comrpession ,    Personal Factors and Comorbidities  Comorbidity 3+    Comorbidities  breast surgery, chemotherapy with CIPN, radiation    Examination-Activity Limitations  Reach Overhead    PT Frequency  2x / week    PT Duration  4 weeks    PT Treatment/Interventions  ADLs/Self Care Home Management;Therapeutic activities;Therapeutic exercise;Balance training;Neuromuscular re-education;Orthotic Fit/Training;Patient/family education;Manual techniques;Manual lymph drainage;Compression bandaging;Taping;Dry needling;Passive range of motion    PT Next Visit Plan  upgrade shoulder exercises next session and increase HEP for this. Cont Manual lymph drainage to right breast, add soft tissue work to neck and shoudlers,exercise to right shoulder with PROM, check to see if pt got  compression bra  progress to LE strength and balance work  Consulted and Agree with Plan of Care  Patient       Patient will benefit from skilled therapeutic intervention in order to improve the following deficits and impairments:  Decreased balance, Decreased endurance, Impaired perceived functional ability, Increased edema, Decreased range of motion, Decreased knowledge of precautions, Decreased strength, Increased fascial restricitons, Impaired UE functional use, Postural dysfunction, Pain  Visit Diagnosis: Aftercare following surgery for neoplasm  Lymphedema, not elsewhere classified  Stiffness of right shoulder, not elsewhere classified  Acute pain of right shoulder  Muscle weakness (generalized)     Problem List Patient Active Problem List   Diagnosis Date Noted  . Genetic testing 08/13/2018  . Port-A-Cath in place 08/07/2018  . Family history of lung cancer   . Malignant neoplasm of overlapping sites of right breast in female, estrogen receptor positive ([place]) 07/23/2018  . Uncontrolled diabetes mellitus type 2 without complications 66/10/3014  . Achilles tendonitis, bilateral 06/11/2015  . Chest pain with moderate risk for cardiac etiology 10/13/2014  . Hypercholesterolemia 10/13/2014  . Idiopathic scoliosis 08/18/2014  . Hypertension 09/11/2012  . Overweight (BMI 25.0-29.9) 09/11/2012  . Migraine headache 09/11/2012  . Hx of thyroid nodule 09/11/2012  . History of tobacco use 09/11/2012   Donato Heinz. Owens Shark PT  Norwood Levo 07/09/2019, 2:56 PM  [place], [place], [address] Phone: [phone]   Fax:  [phone]  Name: Autumn Cook MRN: [id number] Date of Birth: [birth year]

## 2019-07-12 ENCOUNTER — Ambulatory Visit: Payer: Managed Care, Other (non HMO)

## 2019-07-14 ENCOUNTER — Other Ambulatory Visit: Payer: Self-pay

## 2019-07-14 ENCOUNTER — Ambulatory Visit: Payer: 59 | Admitting: Physical Therapy

## 2019-07-14 DIAGNOSIS — M25611 Stiffness of right shoulder, not elsewhere classified: Secondary | ICD-10-CM

## 2019-07-14 DIAGNOSIS — L599 Disorder of the skin and subcutaneous tissue related to radiation, unspecified: Secondary | ICD-10-CM

## 2019-07-14 DIAGNOSIS — Z483 Aftercare following surgery for neoplasm: Secondary | ICD-10-CM

## 2019-07-14 DIAGNOSIS — M25511 Pain in right shoulder: Secondary | ICD-10-CM

## 2019-07-14 DIAGNOSIS — M6281 Muscle weakness (generalized): Secondary | ICD-10-CM

## 2019-07-14 DIAGNOSIS — I890 Lymphedema, not elsewhere classified: Secondary | ICD-10-CM

## 2019-07-14 DIAGNOSIS — R293 Abnormal posture: Secondary | ICD-10-CM

## 2019-07-14 NOTE — Therapy (Signed)
[place], [place], [address] Phone: [phone]   Fax:  [phone]  Physical Therapy Treatment  Patient Details  Name: Autumn Cook MRN: [id number] Date of Birth: [birth year] Referring Provider (PT): [name]    Encounter Date: 07/14/2019  PT End of Session - 07/14/19 0950    Visit Number  6    Number of Visits  17    Date for PT Re-Evaluation  08/25/19    PT Start Time  0900    PT Stop Time  0946    PT Time Calculation (min)  46 min    Activity Tolerance  Patient tolerated treatment well    Behavior During Therapy  [facility] for tasks assessed/performed       Past Medical History:  Diagnosis Date  . Allergy   . Arthritis   . Asthma   . Cancer [facility])    R breast cancer  . Diabetes mellitus without complication (HCC)    no meds now  . Family history of adverse reaction to anesthesia    "entire family takes a long time to wake up- N&V for several days"  . Family history of lung cancer   . Hypertension   . Migraine   . PONV (postoperative nausea and vomiting)     Past Surgical History:  Procedure Laterality Date  . BREAST CYST EXCISION  1994  . BREAST LUMPECTOMY WITH RADIOACTIVE SEED AND SENTINEL LYMPH NODE BIOPSY Right 12/27/2018   Procedure: RIGHT BREAST LUMPECTOMY WITH RADIOACTIVE SEED X2 AND RIGHT SEED TARGETED AXILLARY SENTINEL LYMPH NODE BIOPSY;  Surgeon: Alphonsa Overall, MD;  Location: [place];  Service: General;  Laterality: Right;  . DILATION AND CURETTAGE OF UTERUS    . PORTACATH PLACEMENT Right 08/01/2018   Procedure: INSERTION PORT-A-CATH WITH ULTRASOUND;  Surgeon: Alphonsa Overall, MD;  Location: [place];  Service: General;  Laterality: Right;    There were no vitals filed for this visit.  Subjective Assessment - 07/14/19 0904    Subjective  Pt had a good weekend, was able to do some walking.  She is trying to do a little of something every day.   she has found some exercise on youtuble .  She is able to wear the compression bras during the day and feels that her breast is a little less tender.    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  No/denies                  Outpatient Re[facility]  Lymphedema Life Impact Scale Total Score  44.12 %           [facility] Adult PT Treatment/Exercise - 07/14/19 0001      Exercises   Exercises  Shoulder      Shoulder Exercises: Seated   Other Seated Exercises  trunk rotation 2 sets of 10 followed by right shoulder elevation with decrease in pain and increase in ROM       Shoulder Exercises: Sidelying   ABduction  AROM;Right;5 reps    Other Sidelying Exercises  small circles with  hand pointed to ceiling       Manual Therapy   Manual Therapy  Edema management;Manual Lymphatic Drainage (MLD);Passive ROM    Manual therapy comments  small dotted foam to wear inside compression bra at medial inferior area     Manual Lymphatic Drainage (MLD)  In Supine: Supraclavicular fossa, bil shoulder collectors, superficial and deep abdominals, Rt inguinal and Lt axillary nodes, Rt axillo-inguinal anastomosis, then Lt intact upper anterior quadrant sequence and anterior inter-axillary anastomosis, then focused on superior and medial Rt breast redirecting towards anterior inter-axillary anastomosis, then in to Lt S/L for further work along posterior inter-axillary and added posterior inter-axillary anastomosis and focused on lateral and inferior aspects of breast, then finished in supine retracing initial anastomosis.     Passive ROM  to right shoulder  especilly in external rotation and abduction                PT Short Term Goals - 06/25/19 1957      PT SHORT TERM GOAL #1   Title  Pt will report that she can manage her right breast lymphedema with self MLD and compression    Time  4    Period  Weeks    Status  New      PT SHORT TERM GOAL #2   Title  Pt will be independent in HEP for right shoulder    Time  4    Period  Weeks    Status  New      PT SHORT TERM GOAL #3   Title  Pt will report the pain in her right shoulder is decreased to 3/10    Baseline  6/10 on 06/25/2019    Status  New        PT Long Term Goals - 06/25/19 1958      PT LONG TERM GOAL #1   Title  Pt will have 150 degrees of painfree right shoulder flexion    Baseline  128 on 06/25/2019    Time  8    Period  Weeks    Status  New      PT LONG TERM GOAL #2   Title  Pt will decrease Quick DASH score to < 45 indicating an improvment in UE function    Baseline  65.91 on 06/25/2019    Time  8    Period  Weeks    Status  New      PT LONG TERM GOAL #3   Title  Pt will increase repeated sit to stand to 10 in 30 seconds indicating an improvment in general strength    Baseline  8 in 30 seconds on 06/25/2019    Time  8    Period  Weeks    Status  New            Plan - 07/14/19 0951    Clinical Impression Statement  Pt is making progress with less pain in right breast.  She still has fullness with palpable congestion in medial breast and lateral breast near axilla that softens with MLD.  Upgraded foam patches for pt to wear in bra today . Pt is starting to do more exercises at home    Comorbidities  breast surgery, chemotherapy with CIPN, radiation    Stability/Clinical Decision Making  Stable/Uncomplicated    Clinical Decision Making  Low    Rehab Potential  Good    PT Frequency  2x / week    PT Duration  4 weeks  PT Treatment/Interventions  ADLs/Self Care Home Management;Therapeutic activities;Therapeutic exercise;Balance  training;Neuromuscular re-education;Orthotic Fit/Training;Patient/family education;Manual techniques;Manual lymph drainage;Compression bandaging;Taping;Dry needling;Passive range of motion    PT Next Visit Plan  upgrade shoulder exercises next session and increase HEP for this. Cont Manual lymph drainage to right breast, add soft tissue work to neck and shoudlers,exercise to right shoulder with PROM,  progress to LE strength and balance work       Patient will benefit from skilled therapeutic intervention in order to improve the following deficits and impairments:  Decreased balance, Decreased endurance, Impaired perceived functional ability, Increased edema, Decreased range of motion, Decreased knowledge of precautions, Decreased strength, Increased fascial restricitons, Impaired UE functional use, Postural dysfunction, Pain  Visit Diagnosis: Aftercare following surgery for neoplasm  Lymphedema, not elsewhere classified  Stiffness of right shoulder, not elsewhere classified  Acute pain of right shoulder  Muscle weakness (generalized)  Disorder of the skin and subcutaneous tissue related to radiation, unspecified  Abnormal posture     Problem List Patient Active Problem List   Diagnosis Date Noted  . Genetic testing 08/13/2018  . Port-A-Cath in place 08/07/2018  . Family history of lung cancer   . Malignant neoplasm of overlapping sites of right breast in female, estrogen receptor positive ([place]) 07/23/2018  . Uncontrolled diabetes mellitus type 2 without complications 123456  . Achilles tendonitis, bilateral 06/11/2015  . Chest pain with moderate risk for cardiac etiology 10/13/2014  . Hypercholesterolemia 10/13/2014  . Idiopathic scoliosis 08/18/2014  . Hypertension 09/11/2012  . Overweight (BMI 25.0-29.9) 09/11/2012  . Migraine headache 09/11/2012  . Hx of thyroid nodule 09/11/2012  . History of tobacco use 09/11/2012   Donato Heinz. Owens Shark PT  [place]wood Levo 07/14/2019, 9:55 AM  [place], [place], [address] Phone: [phone]   Fax:  [phone]  Name: VERMELLE MCCAA MRN: [id number] Date of Birth: 03-18-1961

## 2019-07-15 ENCOUNTER — Encounter: Payer: Self-pay | Admitting: Oncology

## 2019-07-16 ENCOUNTER — Ambulatory Visit: Payer: 59 | Admitting: Physical Therapy

## 2019-07-16 ENCOUNTER — Other Ambulatory Visit: Payer: Self-pay

## 2019-07-16 ENCOUNTER — Encounter: Payer: Self-pay | Admitting: Physical Therapy

## 2019-07-16 DIAGNOSIS — Z483 Aftercare following surgery for neoplasm: Secondary | ICD-10-CM

## 2019-07-16 DIAGNOSIS — M25511 Pain in right shoulder: Secondary | ICD-10-CM

## 2019-07-16 DIAGNOSIS — L599 Disorder of the skin and subcutaneous tissue related to radiation, unspecified: Secondary | ICD-10-CM

## 2019-07-16 DIAGNOSIS — R293 Abnormal posture: Secondary | ICD-10-CM

## 2019-07-16 DIAGNOSIS — M25611 Stiffness of right shoulder, not elsewhere classified: Secondary | ICD-10-CM

## 2019-07-16 DIAGNOSIS — M6281 Muscle weakness (generalized): Secondary | ICD-10-CM

## 2019-07-16 DIAGNOSIS — I890 Lymphedema, not elsewhere classified: Secondary | ICD-10-CM

## 2019-07-16 NOTE — Patient Instructions (Signed)
Access Code: [email or web address] URL: [email or web address] Date: 07/16/2019 Prepared by: Maudry Diego  Program Notes Start with 5 repetitions and gradually work up to 3 sets of 10  Exercises Supine Shoulder Flexion with Dowel - 10 reps - 3 sets - 1x daily - 3x weekly Supine Single Arm Shoulder Protraction - 10 reps - 3 sets - 1x daily - 3x weekly Sidelying Shoulder External Rotation - 10 reps - 3 sets - 1x daily - 3x weekly Sidelying Shoulder Horizontal Abduction - 10 reps - 3 sets - 1x daily - 3x weekly Seated Isometric Shoulder Inferior Glide - 10 reps - 3 sets - 1x daily - 3x weekly Dynamic Hug - 5 reps - 3 sets - 1x daily - 3x weekly Wall Push Up with Plus - 10 reps - 3 sets - 1x daily - 3x weekly Standing Low Shoulder Row with Anchored Resistance - 10 reps - 3 sets - 1x daily - 3x weekly Single Arm Doorway Pec Stretch at 90 Degrees Abduction - 5 reps - 10 hold - 1x daily - 7x weekly

## 2019-07-16 NOTE — Therapy (Signed)
[place], [place], [address] Phone: [phone]   Fax:  [phone]  Physical Therapy Treatment  Patient Details  Name: Autumn Cook MRN: [id number] Date of Birth: [birth year] Referring Provider (PT): [name]    Encounter Date: 07/16/2019    Past Medical History:  Diagnosis Date  . Allergy   . Arthritis   . Asthma   . Cancer [facility])    R breast cancer  . Diabetes mellitus without complication (HCC)    no meds now  . Family history of adverse reaction to anesthesia    "entire family takes a long time to wake up- N&V for several days"  . Family history of lung cancer   . Hypertension   . Migraine   . PONV (postoperative nausea and vomiting)     Past Surgical History:  Procedure Laterality Date  . BREAST CYST EXCISION  1994  . BREAST LUMPECTOMY WITH RADIOACTIVE SEED AND SENTINEL LYMPH NODE BIOPSY Right 12/27/2018   Procedure: RIGHT BREAST LUMPECTOMY WITH RADIOACTIVE SEED X2 AND RIGHT SEED TARGETED AXILLARY SENTINEL LYMPH NODE BIOPSY;  Surgeon: Alphonsa Overall, MD;  Location: [place];  Service: General;  Laterality: Right;  . DILATION AND CURETTAGE OF UTERUS    . PORTACATH PLACEMENT Right 08/01/2018   Procedure: INSERTION PORT-A-CATH WITH ULTRASOUND;  Surgeon: Alphonsa Overall, MD;  Location: [place];  Service: General;  Laterality: Right;    There were no vitals filed for this visit.  Subjective Assessment - 07/16/19 1253    Subjective  Pt says she worked out this morning with youtube.  She is having some discomfort in her shoulder but is happy to be doing exercise    Pertinent History  2 sites of cancer in right breast breast cancer diagnosed in Feb 2020 with neo adjuvant chemo with CIPN numbness in feet, right lumpectomy at 2 sites in right breast with 5 sentinel nodes in August 2020, followed by radiation    Patient Stated Goals  had minimal pain and better  ability to use right side    Currently in Pain?  No/denies                  Outpatient Re[facility] from [facility]  Lymphedema Life Impact Scale Total Score  44.12 %           [facility] Adult PT Treatment/Exercise - 07/16/19 0001      Shoulder Exercises: Seated   Other Seated Exercises  dynamic hug    pt with discomfort with this    Other Seated Exercises  active inferior glide with arm on table       Shoulder Exercises: Sidelying   External Rotation  AROM;Right;5 reps   folded towel under arm    ABduction  AROM;Right;5 reps    Other Sidelying Exercises  small circles with hand pointed to ceiling       Shoulder Exercises: Standing   Row  AAROM;Strengthening;Left;10 reps    Theraband Level (Shoulder Row)  Level 2 (Red)    Row Limitations  5 reps with elbows bent, 5 reps with elbows straight     Other Standing Exercises  push up with a plus x 5 reps       Manual Therapy   Manual Lymphatic Drainage (MLD)  In Supine: Supraclavicular fossa, bil shoulder collectors, superficial and deep abdominals, Rt inguinal and Lt axillary nodes, Rt axillo-inguinal anastomosis, then Lt intact upper anterior quadrant  sequence and anterior inter-axillary anastomosis, then focused on superior and medial Rt breast redirecting towards anterior inter-axillary anastomosis, then in to Lt S/L for further work along posterior inter-axillary and added posterior inter-axillary anastomosis and focused on lateral and inferior aspects of breast, then finished in supine retracing initial anastomosis.     Passive ROM  to right shoulder especilly in external rotation and abduction                PT Short Term Goals - 06/25/19 1957      PT SHORT TERM GOAL #1   Title  Pt will report that she can manage her right breast lymphedema with self MLD and compression    Time  4    Period  Weeks    Status  New      PT SHORT TERM GOAL #2   Title  Pt will be  independent in HEP for right shoulder    Time  4    Period  Weeks    Status  New      PT SHORT TERM GOAL #3   Title  Pt will report the pain in her right shoulder is decreased to 3/10    Baseline  6/10 on 06/25/2019    Status  New        PT Long Term Goals - 06/25/19 1958      PT LONG TERM GOAL #1   Title  Pt will have 150 degrees of painfree right shoulder flexion    Baseline  128 on 06/25/2019    Time  8    Period  Weeks    Status  New      PT LONG TERM GOAL #2   Title  Pt will decrease Quick DASH score to < 45 indicating an improvment in UE function    Baseline  65.91 on 06/25/2019    Time  8    Period  Weeks    Status  New      PT LONG TERM GOAL #3   Title  Pt will increase repeated sit to stand to 10 in 30 seconds indicating an improvment in general strength    Baseline  8 in 30 seconds on 06/25/2019    Time  8    Period  Weeks    Status  New            Plan - 07/16/19 1257    Clinical Impression Statement  Pt has started to do more exercises at home and HEP was upgraded today to focus more on UE specific strenghening especially serratus anterior activationl  Pt had some discomfort with exericse but felt better at end of session    Personal Factors and Comorbidities  Comorbidity 3+    Comorbidities  breast surgery, chemotherapy with CIPN, radiation    Examination-Activity Limitations  Reach Overhead    Stability/Clinical Decision Making  Stable/Uncomplicated    Rehab Potential  Good    PT Frequency  2x / week    PT Duration  4 weeks    PT Treatment/Interventions  ADLs/Self Care Home Management;Therapeutic activities;Therapeutic exercise;Balance training;Neuromuscular re-education;Orthotic Fit/Training;Patient/family education;Manual techniques;Manual lymph drainage;Compression bandaging;Taping;Dry needling;Passive range of motion    PT Next Visit Plan  review  shoulder exercises next session and increase HEP for this. Cont Manual lymph drainage to right breast,  add soft tissue work to neck and shoudlers,exercise to right shoulder with PROM,  progress to LE strength and balance work    [place]  Access Code: [email or web address]    Consulted and Agree with Plan of Care  Patient       Patient will benefit from skilled therapeutic intervention in order to improve the following deficits and impairments:  Decreased balance, Decreased endurance, Impaired perceived functional ability, Increased edema, Decreased range of motion, Decreased knowledge of precautions, Decreased strength, Increased fascial restricitons, Impaired UE functional use, Postural dysfunction, Pain  Visit Diagnosis: Aftercare following surgery for neoplasm  Lymphedema, not elsewhere classified  Stiffness of right shoulder, not elsewhere classified  Acute pain of right shoulder  Muscle weakness (generalized)  Disorder of the skin and subcutaneous tissue related to radiation, unspecified  Abnormal posture     Problem List Patient Active Problem List   Diagnosis Date Noted  . Genetic testing 08/13/2018  . Port-A-Cath in place 08/07/2018  . Family history of lung cancer   . Malignant neoplasm of overlapping sites of right breast in female, estrogen receptor positive ([place]) 07/23/2018  . Uncontrolled diabetes mellitus type 2 without complications 123456  . Achilles tendonitis, bilateral 06/11/2015  . Chest pain with moderate risk for cardiac etiology 10/13/2014  . Hypercholesterolemia 10/13/2014  . Idiopathic scoliosis 08/18/2014  . Hypertension 09/11/2012  . Overweight (BMI 25.0-29.9) 09/11/2012  . Migraine headache 09/11/2012  . Hx of thyroid nodule 09/11/2012  . History of tobacco use 09/11/2012    Donato Heinz. Owens Shark PT  [place] Levo 07/16/2019, 1:01 PM  [place], [place], [address] Phone: [phone]   Fax:  [phone]  Name: Autumn Cook MRN: [id number] Date of  Birth: 01-Jul-1960

## 2019-07-21 ENCOUNTER — Other Ambulatory Visit: Payer: Self-pay

## 2019-07-21 ENCOUNTER — Ambulatory Visit: Payer: 59

## 2019-07-21 DIAGNOSIS — L599 Disorder of the skin and subcutaneous tissue related to radiation, unspecified: Secondary | ICD-10-CM

## 2019-07-21 DIAGNOSIS — M25611 Stiffness of right shoulder, not elsewhere classified: Secondary | ICD-10-CM

## 2019-07-21 DIAGNOSIS — Z170 Estrogen receptor positive status [ER+]: Secondary | ICD-10-CM

## 2019-07-21 DIAGNOSIS — M6281 Muscle weakness (generalized): Secondary | ICD-10-CM

## 2019-07-21 DIAGNOSIS — M25511 Pain in right shoulder: Secondary | ICD-10-CM

## 2019-07-21 DIAGNOSIS — R296 Repeated falls: Secondary | ICD-10-CM

## 2019-07-21 DIAGNOSIS — Z483 Aftercare following surgery for neoplasm: Secondary | ICD-10-CM | POA: Diagnosis not present

## 2019-07-21 DIAGNOSIS — I890 Lymphedema, not elsewhere classified: Secondary | ICD-10-CM

## 2019-07-21 DIAGNOSIS — R293 Abnormal posture: Secondary | ICD-10-CM

## 2019-07-21 DIAGNOSIS — C50811 Malignant neoplasm of overlapping sites of right female breast: Secondary | ICD-10-CM

## 2019-07-21 NOTE — Therapy (Signed)
[place], [place], [address] Phone: [phone]   Fax:  [phone]  Physical Therapy Treatment  Patient Details  Name: Autumn Cook MRN: [id number] Date of Birth: 1960/09/19 Referring Provider (PT): [name]    Encounter Date: 07/21/2019  PT End of Session - 07/21/19 0905    Visit Number  8    Number of Visits  17    Date for PT Re-Evaluation  08/25/19    PT Start Time  0905    PT Stop Time  0958    PT Time Calculation (min)  53 min    Activity Tolerance  Patient tolerated treatment well    Behavior During Therapy  [facility] for tasks assessed/performed       Past Medical History:  Diagnosis Date  . Allergy   . Arthritis   . Asthma   . Cancer [facility])    R breast cancer  . Diabetes mellitus without complication (HCC)    no meds now  . Family history of adverse reaction to anesthesia    "entire family takes a long time to wake up- N&V for several days"  . Family history of lung cancer   . Hypertension   . Migraine   . PONV (postoperative nausea and vomiting)     Past Surgical History:  Procedure Laterality Date  . BREAST CYST EXCISION  1994  . BREAST LUMPECTOMY WITH RADIOACTIVE SEED AND SENTINEL LYMPH NODE BIOPSY Right 12/27/2018   Procedure: RIGHT BREAST LUMPECTOMY WITH RADIOACTIVE SEED X2 AND RIGHT SEED TARGETED AXILLARY SENTINEL LYMPH NODE BIOPSY;  Surgeon: Alphonsa Overall, MD;  Location: [place];  Service: General;  Laterality: Right;  . DILATION AND CURETTAGE OF UTERUS    . PORTACATH PLACEMENT Right 08/01/2018   Procedure: INSERTION PORT-A-CATH WITH ULTRASOUND;  Surgeon: Alphonsa Overall, MD;  Location: [place];  Service: General;  Laterality: Right;    There were no vitals filed for this visit.  Subjective Assessment - 07/21/19 0906    Subjective  Pt states that she got her COVID shot last Thursday and she was really sore from it so she hasn't been  able to do her exercises.    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  No/denies    Pain Score  0-No pain                  [facility]patient [facility]  Lymphedema Life Impact Scale Total Score  44.12 %           [facility] Adult PT Treatment/Exercise - 07/21/19 0001      Shoulder Exercises: Supine   External Rotation  AROM;Both    External Rotation Limitations  3x 10 seconds hold with ER/abduction with hands behind head and active scapular retraction pushing elbows toward mat table with 10 second hold. VC to avoid pain.     Flexion  AAROM;Both;10 reps    Flexion Limitations  VC to avoid pain and for short stretch at end range w/continued decrease in ROM at the R shoulder.       Shoulder Exercises:  Sidelying   Other Sidelying Exercises  Open book (horizontal abdcution) with scapular squeeze at end range. Pt reports fatique performed 10x       Manual Therapy   Manual Therapy  Manual Lymphatic Drainage (MLD);Soft tissue mobilization    Soft tissue mobilization  STM to the L levator scapula, upper trapeziu and scalene group; improvement in tightness/tenderness at the end of session.     Manual Lymphatic Drainage (MLD)  In Supine: Supraclavicular fossa, bil shoulder collectors, superficial and deep abdominals, Rt inguinal and bil axillary nodes, Rt axillo-inguinal anastomosis, then Lt intact upper anterior quadrant sequence and anterior inter-axillary anastomosis, then focused on superior and medial Rt breast redirecting towards anterior inter-axillary anastomosis, then inferior breast toward the R axillo-inguinal anastomosis;  retraced all steps then deep abdominals             PT Education - 07/21/19 1003    Education Details  Pt will continue with self manual lymph drainage at home. She will consider a vasopneumatic pump.    Person(s) Educated  Patient    Methods  Explanation    Comprehension  Verbalized understanding       PT Short Term Goals - 06/25/19 1957      PT SHORT TERM GOAL #1   Title  Pt will report that she can manage her right breast lymphedema with self MLD and compression    Time  4    Period  Weeks    Status  New      PT SHORT TERM GOAL #2   Title  Pt will be independent in HEP for right shoulder    Time  4    Period  Weeks    Status  New      PT SHORT TERM GOAL #3   Title  Pt will report the pain in her right shoulder is decreased to 3/10    Baseline  6/10 on 06/25/2019    Status  New        PT Long Term Goals - 06/25/19 1958      PT LONG TERM GOAL #1   Title  Pt will have 150 degrees of painfree right shoulder flexion    Baseline  128 on 06/25/2019    Time  8    Period  Weeks    Status  New      PT LONG TERM GOAL #2   Title  Pt will decrease Quick DASH score to < 45 indicating an improvment in UE function    Baseline  65.91 on 06/25/2019    Time  8    Period  Weeks    Status  New      PT LONG TERM GOAL #3   Title  Pt will increase repeated sit to stand to 10 in 30 seconds indicating an improvment in general strength    Baseline  8 in 30 seconds on 06/25/2019    Time  8    Period  Weeks    Status  New            Plan - 07/21/19 0905    Clinical Impression Statement  Pt reports that she has been working on her exercises and self MLD a thome. She presents today with continued edema in her R breast that presents at non-pitting edema that feels fibrotic that softens significnatly following MLD to the L breast. Palpable tightness/tenderness noted at the R levator scapula, scalenes and upper trapezius most likely related to scapular changes following  lumpectomy  and node removal; decreased mildly following light STM. Pt shoulder ROm activities increased today with reports of pulling when asked to activate her middle traps/rhomboids. Pt will benefit from continued POC at this time.    Personal Factors and Comorbidities  Comorbidity 3+    Comorbidities  breast surgery, chemotherapy with CIPN, radiation    Examination-Activity Limitations  Reach Overhead    Rehab Potential  Good    PT Frequency  2x / week    PT Duration  4 weeks    PT Treatment/Interventions  ADLs/Self Care Home Management;Therapeutic activities;Therapeutic exercise;Balance training;Neuromuscular re-education;Orthotic Fit/Training;Patient/family education;Manual techniques;Manual lymph drainage;Compression bandaging;Taping;Dry needling;Passive range of motion    PT Next Visit Plan  review  shoulder exercises next session/add scapular stabilization and increase HEP for this. Cont Manual lymph drainage to right breast, add soft tissue work to neck and shoudlers,exercise to right shoulder with PROM,  progress to LE strength and balance work    PT Home Exercise Plan  Access Code: [email or web address]    Recommended Other Services  Compression bra and possible vasopneumatic pump    Consulted and Agree with Plan of Care  Patient       Patient will benefit from skilled therapeutic intervention in order to improve the following deficits and impairments:  Decreased balance, Decreased endurance, Impaired perceived functional ability, Increased edema, Decreased range of motion, Decreased knowledge of precautions, Decreased strength, Increased fascial restricitons, Impaired UE functional use, Postural dysfunction, Pain  Visit Diagnosis: Lymphedema, not elsewhere classified  Stiffness of right shoulder, not elsewhere classified  Acute pain of right shoulder  Muscle weakness (generalized)  Disorder of the skin and subcutaneous tissue related to radiation, unspecified  Abnormal posture  Repeated  falls  Malignant neoplasm of overlapping sites of right breast in female, estrogen receptor positive ([place])     Problem List Patient Active Problem List   Diagnosis Date Noted  . Genetic testing 08/13/2018  . Port-A-Cath in place 08/07/2018  . Family history of lung cancer   . Malignant neoplasm of overlapping sites of right breast in female, estrogen receptor positive ([place]) 07/23/2018  . Uncontrolled diabetes mellitus type 2 without complications 123456  . Achilles tendonitis, bilateral 06/11/2015  . Chest pain with moderate risk for cardiac etiology 10/13/2014  . Hypercholesterolemia 10/13/2014  . Idiopathic scoliosis 08/18/2014  . Hypertension 09/11/2012  . Overweight (BMI 25.0-29.9) 09/11/2012  . Migraine headache 09/11/2012  . Hx of thyroid nodule 09/11/2012  . History of tobacco use 09/11/2012    Ander Purpura, PT 07/21/2019, 10:59 AM  [place], [place], [address] Phone: [phone]   Fax:  [phone]  Name: Autumn Cook MRN: [id number] Date of Birth: 24-May-1960

## 2019-07-23 ENCOUNTER — Ambulatory Visit: Payer: 59 | Admitting: Physical Therapy

## 2019-07-23 ENCOUNTER — Encounter: Payer: Self-pay | Admitting: Physical Therapy

## 2019-07-23 ENCOUNTER — Other Ambulatory Visit: Payer: Self-pay

## 2019-07-23 DIAGNOSIS — M25511 Pain in right shoulder: Secondary | ICD-10-CM

## 2019-07-23 DIAGNOSIS — R293 Abnormal posture: Secondary | ICD-10-CM

## 2019-07-23 DIAGNOSIS — R296 Repeated falls: Secondary | ICD-10-CM

## 2019-07-23 DIAGNOSIS — M6281 Muscle weakness (generalized): Secondary | ICD-10-CM

## 2019-07-23 DIAGNOSIS — Z483 Aftercare following surgery for neoplasm: Secondary | ICD-10-CM

## 2019-07-23 DIAGNOSIS — L599 Disorder of the skin and subcutaneous tissue related to radiation, unspecified: Secondary | ICD-10-CM

## 2019-07-23 DIAGNOSIS — I890 Lymphedema, not elsewhere classified: Secondary | ICD-10-CM

## 2019-07-23 DIAGNOSIS — M25611 Stiffness of right shoulder, not elsewhere classified: Secondary | ICD-10-CM

## 2019-07-23 NOTE — Therapy (Signed)
[place], [place], [address] Phone: [phone]   Fax:  [phone]  Physical Therapy Treatment  Patient Details  Name: Autumn Cook MRN: [id number] Date of Birth: December 13, 1960 Referring Provider (PT): [name]    Encounter Date: 07/23/2019  PT End of Session - 07/23/19 1509    Visit Number  9    Number of Visits  17    Date for PT Re-Evaluation  08/25/19    PT Start Time  1200    PT Stop Time  1255    PT Time Calculation (min)  55 min    Activity Tolerance  Patient tolerated treatment well    Behavior During Therapy  [facility] for tasks assessed/performed       Past Medical History:  Diagnosis Date  . Allergy   . Arthritis   . Asthma   . Cancer [facility])    R breast cancer  . Diabetes mellitus without complication (HCC)    no meds now  . Family history of adverse reaction to anesthesia    "entire family takes a long time to wake up- N&V for several days"  . Family history of lung cancer   . Hypertension   . Migraine   . PONV (postoperative nausea and vomiting)     Past Surgical History:  Procedure Laterality Date  . BREAST CYST EXCISION  1994  . BREAST LUMPECTOMY WITH RADIOACTIVE SEED AND SENTINEL LYMPH NODE BIOPSY Right 12/27/2018   Procedure: RIGHT BREAST LUMPECTOMY WITH RADIOACTIVE SEED X2 AND RIGHT SEED TARGETED AXILLARY SENTINEL LYMPH NODE BIOPSY;  Surgeon: Alphonsa Overall, MD;  Location: [place];  Service: General;  Laterality: Right;  . DILATION AND CURETTAGE OF UTERUS    . PORTACATH PLACEMENT Right 08/01/2018   Procedure: INSERTION PORT-A-CATH WITH ULTRASOUND;  Surgeon: Alphonsa Overall, MD;  Location: [place];  Service: General;  Laterality: Right;    There were no vitals filed for this visit.  Subjective Assessment - 07/23/19 1209    Subjective  Pt says she is "learning" She is doing self MLD at home She had some pain with last treatment, but it  really made a big difference    Pertinent History  2 sites of cancer in right breast breast cancer diagnosed in Feb 2020 with neo adjuvant chemo with CIPN numbness in feet, right lumpectomy at 2 sites in right breast with 5 sentinel nodes in August 2020, followed by radiation    Patient Stated Goals  had minimal pain and better ability to use right side    Currently in Pain?  No/denies         [facility] PT Assessment - 07/23/19 0001      AROM   Right Shoulder Flexion  143 Degrees    Right Shoulder ABduction  155 Degrees              Outpatient Re[facility] from [facility]  Lymphedema Life Impact Scale Total Score  44.12 %           [facility] Adult PT Treatment/Exercise - 07/23/19 0001      Manual Therapy   Edema Management  demographics sent to [employer] to see what her benfits would be     Soft tissue mobilization  STM to the L levator scapula, upper trapeziu and scalene group; improvement in tightness/tenderness at the end of session.     Manual Lymphatic Drainage (MLD)  In Supine: Supraclavicular fossa, bil  shoulder collectors, superficial and deep abdominals, Rt inguinal and Lt axillary nodes, Rt axillo-inguinal anastomosis, then Lt intact upper anterior quadrant sequence and anterior inter-axillary anastomosis, then focused on superior and medial Rt breast redirecting towards anterior inter-axillary anastomosis, then in to Lt S/L for further work along posterior inter-axillary and added posterior inter-axillary anastomosis and focused on lateral and inferior aspects of breast, then finished in supine retracing initial anastomosis.     Passive ROM  to right shoulder especilly in external rotation and abduction                PT Short Term Goals - 07/23/19 1517      PT SHORT TERM GOAL #1   Title  Pt will report that she can manage her right breast lymphedema with self MLD and compression    Time  4    Period  Weeks    Status   On-going      PT SHORT TERM GOAL #2   Title  Pt will be independent in HEP for right shoulder    Time  4    Period  Weeks    Status  On-going      PT SHORT TERM GOAL #3   Title  Pt will report the pain in her right shoulder is decreased to 3/10    Baseline  6/10 on 06/25/2019    Time  4    Period  Weeks    Status  On-going        PT Long Term Goals - 07/23/19 1517      PT LONG TERM GOAL #1   Title  Pt will have 150 degrees of painfree right shoulder flexion    Baseline  128 on 06/25/2019,, 155 on 07/23/2019    Status  Achieved      PT LONG TERM GOAL #2   Title  Pt will decrease Quick DASH score to < 45 indicating an improvment in UE function    Baseline  65.91 on 06/25/2019    Time  8    Period  Weeks    Status  On-going      PT LONG TERM GOAL #3   Title  Pt will increase repeated sit to stand to 10 in 30 seconds indicating an improvment in general strength    Time  8    Period  Weeks    Status  On-going            Plan - 07/23/19 1510    Clinical Impression Statement  Pt reports she is getting better and has benefitted from the soft tissue work though it was painful at first.  She wants to find out what her costs will be for the [employer] as she continues to have fullness and firmness in her breast that reduces some with MLD.    Comorbidities  breast surgery, chemotherapy with CIPN, radiation    Examination-Activity Limitations  Reach Overhead    PT Frequency  2x / week    PT Duration  4 weeks    PT Treatment/Interventions  ADLs/Self Care Home Management;Therapeutic activities;Therapeutic exercise;Balance training;Neuromuscular re-education;Orthotic Fit/Training;Patient/family education;Manual techniques;Manual lymph drainage;Compression bandaging;Taping;Dry needling;Passive range of motion    PT Next Visit Plan  review  shoulder exercises next session/add scapular stabilization and increase HEP for this. Cont Manual lymph drainage to right breast, add soft tissue  work to neck and shoudlers,exercise to right shoulder with PROM,  progress to LE strength and balance work  Patient will benefit from skilled therapeutic intervention in order to improve the following deficits and impairments:  Decreased balance, Decreased endurance, Impaired perceived functional ability, Increased edema, Decreased range of motion, Decreased knowledge of precautions, Decreased strength, Increased fascial restricitons, Impaired UE functional use, Postural dysfunction, Pain  Visit Diagnosis: Lymphedema, not elsewhere classified  Stiffness of right shoulder, not elsewhere classified  Acute pain of right shoulder  Muscle weakness (generalized)  Disorder of the skin and subcutaneous tissue related to radiation, unspecified  Abnormal posture  Repeated falls  Aftercare following surgery for neoplasm     Problem List Patient Active Problem List   Diagnosis Date Noted  . Genetic testing 08/13/2018  . Port-A-Cath in place 08/07/2018  . Family history of lung cancer   . Malignant neoplasm of overlapping sites of right breast in female, estrogen receptor positive ([place]) 07/23/2018  . Uncontrolled diabetes mellitus type 2 without complications 123456  . Achilles tendonitis, bilateral 06/11/2015  . Chest pain with moderate risk for cardiac etiology 10/13/2014  . Hypercholesterolemia 10/13/2014  . Idiopathic scoliosis 08/18/2014  . Hypertension 09/11/2012  . Overweight (BMI 25.0-29.9) 09/11/2012  . Migraine headache 09/11/2012  . Hx of thyroid nodule 09/11/2012  . History of tobacco use 09/11/2012   Donato Heinz. Owens Shark PT  Norwood Levo 07/23/2019, 3:19 PM  [place], [place], [address] Phone: [phone]   Fax:  [phone]  Name: Autumn Cook MRN: [id number] Date of Birth: 01/22/1961

## 2019-08-28 ENCOUNTER — Encounter: Payer: Self-pay | Admitting: Oncology

## 2019-08-28 ENCOUNTER — Inpatient Hospital Stay: Payer: 59

## 2019-08-28 ENCOUNTER — Inpatient Hospital Stay: Payer: 59 | Admitting: Oncology

## 2019-09-05 ENCOUNTER — Encounter: Payer: Self-pay | Admitting: Oncology

## 2019-09-17 ENCOUNTER — Other Ambulatory Visit: Payer: Self-pay

## 2019-09-17 ENCOUNTER — Ambulatory Visit: Payer: 59 | Attending: Oncology | Admitting: Physical Therapy

## 2019-09-17 DIAGNOSIS — R296 Repeated falls: Secondary | ICD-10-CM | POA: Insufficient documentation

## 2019-09-17 DIAGNOSIS — M6281 Muscle weakness (generalized): Secondary | ICD-10-CM

## 2019-09-17 DIAGNOSIS — R293 Abnormal posture: Secondary | ICD-10-CM | POA: Insufficient documentation

## 2019-09-17 DIAGNOSIS — L599 Disorder of the skin and subcutaneous tissue related to radiation, unspecified: Secondary | ICD-10-CM | POA: Diagnosis present

## 2019-09-17 DIAGNOSIS — I890 Lymphedema, not elsewhere classified: Secondary | ICD-10-CM | POA: Diagnosis not present

## 2019-09-17 DIAGNOSIS — Z483 Aftercare following surgery for neoplasm: Secondary | ICD-10-CM | POA: Diagnosis present

## 2019-09-17 NOTE — Therapy (Signed)
[place], [place], [address] Phone: [phone]   Fax:  [phone]  Physical Therapy Treatment  Patient Details  Name: Autumn Cook MRN: [id number] Date of Birth: [birth year] Referring Provider (PT): [name]   Progress Note Reporting Period 06/25/2019  to 09/17/2019  See note below for Objective Data and Assessment of Progress/Goals.      Encounter Date: 09/17/2019  PT End of Session - 09/17/19 1628    Visit Number  10    Number of Visits  25    Date for PT Re-Evaluation  10/20/19    PT Start Time  1400    PT Stop Time  1445    PT Time Calculation (min)  45 min    Activity Tolerance  Patient tolerated treatment well    Behavior During Therapy  [facility] for tasks assessed/performed       Past Medical History:  Diagnosis Date  . Allergy   . Arthritis   . Asthma   . Cancer [facility])    R breast cancer  . Diabetes mellitus without complication (HCC)    no meds now  . Family history of adverse reaction to anesthesia    "entire family takes a long time to wake up- N&V for several days"  . Family history of lung cancer   . Hypertension   . Migraine   . PONV (postoperative nausea and vomiting)     Past Surgical History:  Procedure Laterality Date  . BREAST CYST EXCISION  1994  . BREAST LUMPECTOMY WITH RADIOACTIVE SEED AND SENTINEL LYMPH NODE BIOPSY Right 12/27/2018   Procedure: RIGHT BREAST LUMPECTOMY WITH RADIOACTIVE SEED X2 AND RIGHT SEED TARGETED AXILLARY SENTINEL LYMPH NODE BIOPSY;  Surgeon: Alphonsa Overall, MD;  Location: [place];  Service: General;  Laterality: Right;  . DILATION AND CURETTAGE OF UTERUS    . PORTACATH PLACEMENT Right 08/01/2018   Procedure: INSERTION PORT-A-CATH WITH ULTRASOUND;  Surgeon: Alphonsa Overall, MD;  Location: [place]  Service: General;  Laterality: Right;    There were no vitals filed for this visit.  Subjective Assessment -  09/17/19 1405    Subjective  Pt has not been here since March 17. She is doing her arm exercises. She thinks she may have received her Flexitouch but she is not sure. She received a box and didn't even open it.  She is having trouble with numness in her foot that she feels is getting worse. She has to be very careful about where she goes and what she does. She feels the fullness in her breast has returned some.  She is here for treatment for her lymphedema and neuropathy. She wants to focus on MLD for her breast today as she feels the fullness. She has a mammogram on Monday and is fearful it will be painful.    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  Yes    Pain Score  9     Pain Location  Breast    Pain Orientation  Right    Pain Descriptors / Indicators  Tender    Pain Type  Chronic  pain    Pain Onset  More than a month ago    Pain Frequency  Constant         [facility] PT Assessment - 09/17/19 0001      Assessment   Medical Diagnosis  right breast cancer     Referring Provider (PT)  [name]     Onset Date/Surgical Date  12/27/18      AROM   Right Shoulder Flexion  155 Degrees    Right Shoulder ABduction  160 Degrees      Strength   Right Hand Grip (lbs)  55/55/55    Left Hand Grip (lbs)  55/55/55      Standardized Balance Assessment   Standardized Balance Assessment  Timed Up and Go Test      Timed Up and Go Test   Normal TUG (seconds)  9.2               [facility]patient Re[facility] from [facility]  Lymphedema Life Impact Scale Total Score  44.12 %           [facility] Adult PT Treatment/Exercise - 09/17/19 0001      Manual  Therapy   Manual Therapy  Manual Lymphatic Drainage (MLD)    Manual Lymphatic Drainage (MLD)  In Supine: Supraclavicular fossa, bil shoulder collectors, superficial and deep abdominals, Rt inguinal and Lt axillary nodes, Rt axillo-inguinal anastomosis, then Lt intact upper anterior quadrant sequence and anterior inter-axillary anastomosis, then focused on superior and medial Rt breast redirecting towards anterior inter-axillary anastomosis, then in to Lt S/L for further work along posterior inter-axillary and added posterior inter-axillary anastomosis and focused on lateral and inferior aspects of breast, then finished in supine retracing initial anastomosis.     Passive ROM  to right shoulder especilly in external rotation and abduction                PT Short Term Goals - 09/17/19 1633      PT SHORT TERM GOAL #1   Title  Pt will report that she can manage her right breast lymphedema with self MLD and compression    Time  4    Period  Weeks    Status  On-going      PT SHORT TERM GOAL #2   Title  Pt will be independent in HEP for right shoulder    Status  Achieved      PT SHORT TERM GOAL #3   Title  Pt will report the pain in her right shoulder is decreased to 3/10    Baseline  6/10 on 06/25/2019    Time  4    Period  Weeks    Status  On-going        PT Long Term Goals - 09/17/19 1633      PT LONG TERM GOAL #1   Title  Pt will have 150 degrees of painfree right shoulder flexion    Baseline  128 on 06/25/2019,, 155 on 07/23/2019, 155 on 09/17/2019    Status  Achieved      PT LONG TERM GOAL #2   Title  Pt will decrease Quick DASH score to < 45 indicating an improvment in UE function    Baseline  65.91 on 06/25/2019    Time  8    Status  On-going      PT LONG TERM GOAL #3   Title  Pt will increase repeated sit to stand to 10 in 30 seconds  indicating an improvment in general strength    Baseline  8 in 30 seconds on 06/25/2019    Time  8    Period  Weeks    Status   On-going      PT LONG TERM GOAL #4   Title  Pt will decrase TUG score to < 7.5 indicating an improvment in fucntional mobility    Baseline  9.2 on 09/17/2019    Time  4    Period  Weeks    Status  New            Plan - 09/17/19 1628    Clinical Impression Statement  Pt comes back to PT with persistent lymphedema in her right breast and increasing numbness in her right foot. She has improved in her shoulder ROM and grip strength but has decreased her TUG score showing and increase in functional strenth, but a decrease in mobility with increased risk for fall if she walks without a device.  Will request recertfication for 4 more weeks to focuse on breast MLD until she gets her Flexitouch routine  at home ( it may hav been delivered ) and start working on LE stretnthening and balance activities    Personal Factors and Comorbidities  Comorbidity 3+    Comorbidities  breast surgery, chemotherapy with CIPN, radiation    Stability/Clinical Decision Making  Stable/Uncomplicated    Clinical Decision Making  Low    Rehab Potential  Good    PT Frequency  2x / week    PT Duration  4 weeks    PT Treatment/Interventions  ADLs/Self Care Home Management;Therapeutic activities;Therapeutic exercise;Balance training;Neuromuscular re-education;Orthotic Fit/Training;Patient/family education;Manual techniques;Manual lymph drainage;Compression bandaging;Taping;Dry needling;Passive range of motion    PT Next Visit Plan  Cont Manual lymph drainage to right breast, until she gets Flexitouch routine at home and add  LE strength and balance work each session    PT Home Exercise Plan  Access Code: [email or web address]    Consulted and Agree with Plan of Care  Patient       Patient will benefit from skilled therapeutic intervention in order to improve the following deficits and impairments:  Decreased balance, Decreased endurance, Impaired perceived functional ability, Increased edema, Decreased range of motion, Decreased  knowledge of precautions, Decreased strength, Increased fascial restricitons, Impaired UE functional use, Postural dysfunction, Pain  Visit Diagnosis: Lymphedema, not elsewhere classified - Plan: PT plan of care cert/re-cert  Disorder of the skin and subcutaneous tissue related to radiation, unspecified - Plan: PT plan of care cert/re-cert  Muscle weakness (generalized) - Plan: PT plan of care cert/re-cert  Abnormal posture - Plan: PT plan of care cert/re-cert  Repeated falls - Plan: PT plan of care cert/re-cert     Problem List Patient Active Problem List   Diagnosis Date Noted  . Genetic testing 08/13/2018  . Port-A-Cath in place 08/07/2018  . Family history of lung cancer   . Malignant neoplasm of overlapping sites of right breast in female, estrogen receptor positive ([place]) 07/23/2018  . Uncontrolled diabetes mellitus type 2 without complications 123456  . Achilles tendonitis, bilateral 06/11/2015  . Chest pain with moderate risk for cardiac etiology 10/13/2014  . Hypercholesterolemia 10/13/2014  . Idiopathic scoliosis 08/18/2014  . Hypertension 09/11/2012  . Overweight (BMI 25.0-29.9) 09/11/2012  . Migraine headache 09/11/2012  . Hx of thyroid nodule 09/11/2012  . History of tobacco use 09/11/2012   Donato Heinz. Owens Shark, PT  Norwood Levo 09/17/2019, 4:37 PM  [facility]  [place], [place], [address] Phone: [phone]   Fax:  [phone]  Name: Autumn Cook MRN: [id number] Date of Birth: 03/29/1961

## 2019-09-21 ENCOUNTER — Other Ambulatory Visit: Payer: Self-pay | Admitting: Oncology

## 2019-09-22 ENCOUNTER — Telehealth: Payer: Self-pay | Admitting: Adult Health

## 2019-09-22 ENCOUNTER — Other Ambulatory Visit: Payer: Self-pay

## 2019-09-22 ENCOUNTER — Ambulatory Visit
Admission: RE | Admit: 2019-09-22 | Discharge: 2019-09-22 | Disposition: A | Discharge status: Home / Self Care | Payer: Managed Care, Other (non HMO) | Source: Ambulatory Visit | Attending: Oncology | Admitting: Oncology

## 2019-09-22 DIAGNOSIS — C50811 Malignant neoplasm of overlapping sites of right female breast: Secondary | ICD-10-CM

## 2019-09-22 DIAGNOSIS — Z170 Estrogen receptor positive status [ER+]: Secondary | ICD-10-CM

## 2019-09-22 NOTE — Telephone Encounter (Signed)
Scheduled appt per 5/16 sch message- pt to get an updated schedule next visit.

## 2019-09-26 ENCOUNTER — Encounter: Payer: Self-pay | Admitting: Oncology

## 2019-09-29 ENCOUNTER — Ambulatory Visit: Payer: 59 | Admitting: Physical Therapy

## 2019-09-29 ENCOUNTER — Other Ambulatory Visit: Payer: Self-pay

## 2019-09-29 ENCOUNTER — Encounter: Payer: Self-pay | Admitting: Physical Therapy

## 2019-09-29 DIAGNOSIS — R293 Abnormal posture: Secondary | ICD-10-CM

## 2019-09-29 DIAGNOSIS — I890 Lymphedema, not elsewhere classified: Secondary | ICD-10-CM | POA: Diagnosis not present

## 2019-09-29 DIAGNOSIS — Z483 Aftercare following surgery for neoplasm: Secondary | ICD-10-CM

## 2019-09-29 DIAGNOSIS — M6281 Muscle weakness (generalized): Secondary | ICD-10-CM

## 2019-09-29 NOTE — Progress Notes (Addendum)
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  She called because she developed pain in her left flank.  This has been intermittent but has been present for a week.  She has not noted any change in her urine and in particular no hematuria.  She had a mild temperature last night at 99.7 with a little bit of a chill.  In addition she has been having more frequent asthma attacks.  She had a very bad 1 last night she says, with chills.  Her chest felt very tight.  She has albuterol inhaler which she used with some relief.  She feels very tired today.  She has had a dry cough.  She denies any phlegm production.  She continues on anastrozole.  She is tolerating this well, with no significant hot flashes or vaginal dryness problems.  She is obtaining it at a good price  Since her last visit, she underwent bilateral diagnostic mammography with tomography at [facility] on 09/22/2019 showing: breast density category B; no evidence of malignancy in either breast.  She also underwent bone density screening the same day. This showed a T-score of -0.8, which is considered normal.    REVIEW OF SYSTEMS A detailed review of systems today was negative except as noted  above   HISTORY OF CURRENT ILLNESS: From the original intake note:  MAISA BEDINGFIELD had routine screening mammography on 07/01/2018 showing a possible abnormality in the right breast; she notes that did feel some pain and a lump in her breast leading up to the routine screening. She underwent right breast ultrasonography at [facility] on 07/05/2018 showing: A 1.4 x 1.2 x 1.4 cm oval hypoechoic mass at the 5:30 position 8 cm from the nipple. A 0.9 x 1 x 1 cm slightly irregular hypoechoic mass at the 10:30 position 9 cm from the nipple. A 3 x 1.9 x 3 cm probable lymph node without fatty hilum in the LOWER RIGHT axilla. A RIGHT axillary lymph node with slightly thickened cortex.  Accordingly on 07/10/2018 she proceeded to biopsy of the right breast area in question. The pathology from this procedure showed ([id number]): invasive ductal carcinoma, high grade, 5:30 o'clock. Prognostic indicators significant for: estrogen receptor, 30% positive with weak staining intensity and progesterone receptor, 0% negative. Proliferation marker Ki67 at 70%.   An additional biopsy was performed on the same day ([id number]) showing:  2. Breast, right, needle core biopsy, 10 o'clock - Invasive carcinoma  Finally, an additional biopsy was performed at the right axilla on the same day ([id number]) showing: invasive carcinoma, right axilla. Prognostic indicators significant for: estrogen receptor, 70% positive, with weak staining intensity and progesterone receptor, 0% negative. Proliferation marker Ki67 at 70%. HER2 positive by immunohistochemistry.    The patient's subsequent history is as detailed below.   PAST MEDICAL HISTORY: Past Medical History:  Diagnosis  Date  . Allergy   . Arthritis   . Asthma   . Cancer [facility])    R breast cancer  . Diabetes mellitus without complication (HCC)    no meds now  . Family history of adverse reaction to anesthesia    "entire family takes a long time to wake up- N&V  for several days"  . Family history of lung cancer   . Hypertension   . Migraine   . PONV (postoperative nausea and vomiting)     PAST SURGICAL HISTORY: Past Surgical History:  Procedure Laterality Date  . BREAST CYST EXCISION  1994  . BREAST LUMPECTOMY WITH RADIOACTIVE SEED AND SENTINEL LYMPH NODE BIOPSY Right 12/27/2018   Procedure: RIGHT BREAST LUMPECTOMY WITH RADIOACTIVE SEED X2 AND RIGHT SEED TARGETED AXILLARY SENTINEL LYMPH NODE BIOPSY;  Surgeon: Alphonsa Overall, MD;  Location: [facility];  Service: General;  Laterality: Right;  . DILATION AND CURETTAGE OF UTERUS    . PORTACATH PLACEMENT Right 08/01/2018   Procedure: INSERTION PORT-A-CATH WITH ULTRASOUND;  Surgeon: Alphonsa Overall, MD;  Location: [facility];  Service: General;  Laterality: Right;    FAMILY HISTORY: Family History  Problem Relation Age of Onset  . Hypertension Father   . Cancer Father 30       lung cancer  . Stroke Father   . Heart disease Mother        arrhythmia  . Hypertension Mother   . Kidney failure Sister    Breia's father died from lung cancer at age 74; he was a heavy smoker. Patients' mother is 16 as of 07/2018. The patient has 3 brothers and 2 sisters. Patient denies anyone in her family having breast, ovarian, prostate, or pancreatic cancer. Cloey's maternal great great uncle was diagnosed with lung cancer.     GYNECOLOGIC HISTORY:  Patient's last menstrual period was 11/14/2013. 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 Her husband, Autumn Cook, is a Administrator.  The patient separated from her husband in January 2021 and they are planning on an eventual divorce.  Jamice has 4 children, Autumn Cook, Autumn Cook, and Autumn Cook. Autumn Cook is  42, lives in [place], and works in Science writer. Autumn Cook is 31, lives in [place], and works in Engineer, technical sales. Autumn Cook is 95, is taking care of Autumn Cook, and works as a Control and instrumentation engineer. Autumn Cook is 50, lives in [place], and is a Buyer, retail.    ADVANCED DIRECTIVES: At the 06/23/2019 visit the patient was given the appropriate documents to complete and notarized at her discretion.  She is planning to name her daughter Autumn Cook as her healthcare power of attorney.  She can be reached at [phone].   HEALTH MAINTENANCE: Social History   Tobacco Use  . Smoking status: Former Smoker    Packs/day: 1.00    Years: 5.00    Pack years: 5.00    Quit date: 05/08/1988    Years since quitting: 31.4  . Smokeless tobacco: Never Used  Substance Use Topics  . Alcohol use: Yes    Comment: social  . Drug use: No    Colonoscopy: yes, 2010 - due 2020.  PAP: 2017  Bone density: yes, 2015, normal Mammography: 07/01/2018   Allergies  Allergen Reactions  .  Penicillins Hives    Did it involve swelling of the face/tongue/throat, SOB, or low BP? Yes--syncope episode Did it involve sudden or severe rash/hives, skin peeling, or any reaction on the inside of your mouth or nose? No Did you need to seek medical attention at a hospital or doctor's office? Yes When did it last happen? 37-38 YEARS AGO If all above answers are "NO", may proceed with cephalosporin use.   . Metformin And Related Diarrhea  . Adhesive [Tape] Other (See Comments)    "severe bruising"  ALSO SKIN GLUE  . Asa [Aspirin] Other (See Comments)    GI upset     Current Outpatient Medications  Medication Sig Dispense Refill  . albuterol (VENTOLIN HFA) 108 (90 Base) MCG/ACT inhaler Inhale 2 puffs into the lungs every 4 (four) hours as needed for wheezing or shortness of breath (cough, shortness of breath or wheezing.). 1 Inhaler 1  . amLODipine (NORVASC) 5 MG tablet Take 1 tablet (5 mg total) by mouth daily. Start with 2.5 mg  (Patient taking differently: Take 5 mg by mouth daily. Start with 2.5 mg) 90 tablet 3  . anastrozole (ARIMIDEX) 1 MG tablet Take 1 tablet (1 mg total) by mouth daily. 90 tablet 4  . atenolol (TENORMIN) 100 MG tablet TAKE 1 TABLET BY MOUTH EVERY DAY 30 tablet 5  . blood glucose meter kit and supplies KIT Dispense based on patient and insurance preference. Use up to four times daily as directed. (FOR ICD-9 250.00, 250.01). 1 each 0  . glipiZIDE (GLUCOTROL) 5 MG tablet Start with 5 mg once a day, then increase to twice a day after 1 week.  Continue to increase as directed by MD 120 tablet 5  . glucose blood (ACCU-CHEK ACTIVE STRIPS) test strip Use as instructed- check blood sugar up to 2x a day 100 each 12  . KLOR-CON M20 20 MEQ tablet TAKE 1 TABLET BY MOUTH EVERY DAY 30 tablet 14  . lisinopril-hydrochlorothiazide (ZESTORETIC) 20-12.5 MG tablet Take 2 tablets by mouth daily.    . predniSONE (DELTASONE) 5 MG tablet Take 6 tablets day 1, 5 tablets day 2, 4 tabets day 3 and so on until done 21 tablet 0  . prochlorperazine (COMPAZINE) 10 MG tablet Take 1 tablet (10 mg total) by mouth every 6 (six) hours as needed for nausea or vomiting. (Patient not taking: Reported on 06/25/2019) 30 tablet 0  . Vitamin D, Ergocalciferol, (DRISDOL) 1.25 MG (50000 UNIT) CAPS capsule TAKE 1 CAPSULE BY MOUTH ONE TIME PER WEEK 4 capsule 5   No current facility-administered medications for this visit.     OBJECTIVE:  African-American woman who had significant coughing spells and chest tightness with wheezing during exam  Vitals:   09/30/19 0908  BP: 128/68  Pulse: 96  Resp: 18  Temp: 98 F (36.7 C)  SpO2: 97%     Body mass index is 29.36 kg/m.   Wt Readings from Last 3 Encounters:  09/30/19 181 lb 14.4 oz (82.5 kg)  06/23/19 189 lb 14.4 oz (86.1 kg)  05/22/19 192 lb 2 oz (87.1 kg)    ECOG FS:1 - Symptomatic but completely ambulatory  Sclerae unicteric, EOMs intact Wearing a mask No cervical or supraclavicular  adenopathy Lungs no rales or rhonchi, paroxysmal cough when asked to take a deep breath Heart regular rate and rhythm Abd soft, nontender, positive bowel sounds MSK no focal spinal tenderness, no upper extremity lymphedema Neuro: nonfocal, well oriented, appropriate affect Breasts: The right breast has undergone  lumpectomy followed by radiation.  There is scar tissue underneath the incision and some skin coarsening secondary to radiation but no findings suggestive of disease recurrence or residual disease.  The left breast is benign.  Both axillae are benign.   LAB RESULTS:  CMP     Component Value Date/Time   NA 139 09/30/2019 0854   K 3.4 (L) 09/30/2019 0854   CL 101 09/30/2019 0854   CO2 28 09/30/2019 0854   GLUCOSE 211 (H) 09/30/2019 0854   BUN 14 09/30/2019 0854   CREATININE 0.91 09/30/2019 0854   CREATININE 0.72 10/08/2018 1000   CREATININE 0.70 04/14/2015 1558   CALCIUM 10.0 09/30/2019 0854   PROT 7.5 09/30/2019 0854   ALBUMIN 3.7 09/30/2019 0854   AST 16 09/30/2019 0854   AST 17 10/08/2018 1000   ALT 10 09/30/2019 0854   ALT 23 10/08/2018 1000   ALKPHOS 61 09/30/2019 0854   BILITOT 0.4 09/30/2019 0854   BILITOT <0.2 (L) 10/08/2018 1000   GFRNONAA >60 09/30/2019 0854   GFRNONAA >60 10/08/2018 1000   GFRNONAA 87 03/27/2014 1130   GFRAA >60 09/30/2019 0854   GFRAA >60 10/08/2018 1000   GFRAA >89 03/27/2014 1130    No results found for: TOTALPROTELP, ALBUMINELP, A1GS, A2GS, BETS, BETA2SER, GAMS, MSPIKE, SPEI  No results found for: KPAFRELGTCHN, LAMBDASER, KAPLAMBRATIO  Lab Results  Component Value Date   WBC 6.9 09/30/2019   NEUTROABS 5.3 09/30/2019   HGB 12.7 09/30/2019   HCT 37.8 09/30/2019   MCV 87.5 09/30/2019   PLT 316 09/30/2019   No results found for: LABCA2  No components found for: BHALPF790  No results for input(s): INR in the last 168 hours.  No results found for: LABCA2  No results found for: WIO973  No results found for: ZHG992  No  results found for: EQA834  No results found for: CA2729  No components found for: HGQUANT  No results found for: CEA1 / No results found for: CEA1   No results found for: AFPTUMOR  No results found for: CHROMOGRNA  No results found for: HGBA, HGBA2QUANT, HGBFQUANT, HGBSQUAN (Hemoglobinopathy evaluation)   No results found for: LDH  No results found for: IRON, TIBC, IRONPCTSAT (Iron and TIBC)  No results found for: FERRITIN  Urinalysis    Component Value Date/Time   COLORURINE YELLOW 03/07/2016 1350   APPEARANCEUR CLEAR 03/07/2016 1350   LABSPEC 1.031 (H) 03/07/2016 1350   PHURINE 5.0 03/07/2016 1350   GLUCOSEU >1000 (A) 03/07/2016 1350   HGBUR NEGATIVE 03/07/2016 1350   BILIRUBINUR NEGATIVE 03/07/2016 1350   BILIRUBINUR neg 11/24/2013 1547   KETONESUR 15 (A) 03/07/2016 1350   PROTEINUR NEGATIVE 03/07/2016 1350   UROBILINOGEN 0.2 11/24/2013 1547   UROBILINOGEN 0.2 11/10/2006 1153   NITRITE NEGATIVE 03/07/2016 1350   LEUKOCYTESUR NEGATIVE 03/07/2016 1350     STUDIES:  DG Bone Density  Result Date: 09/22/2019 EXAM: DUAL X-RAY ABSORPTIOMETRY (DXA) FOR BONE MINERAL DENSITY IMPRESSION: Referring Physician:  Chauncey Cruel Your patient completed a BMD test using Lunar IDXA DXA system ( analysis version: 16 ) manufactured by [employer]. Technologist: WLS PATIENT: Name: Blaze, Sandin Patient ID: [id number] Birth Date: 10/21/1960 Height: 66.5 in. Sex: Female Measured: 09/22/2019 Weight: 181.5 lbs. Indications: Arimidex, Breast Cancer History, Estrogen Deficient, Low Calcium Intake (269.3), Postmenopausal Fractures: None Treatments: Vitamin D (E933.5) ASSESSMENT: The BMD measured at Femur Neck Right is 0.920 g/cm2 with a T-score of -0.8. This patient is considered normal according to [facility])  criteria. The scan quality is good. Site Region Measured Date Measured Age YA BMD Significant CHANGE T-score DualFemur Neck Right 09/22/2019    57.2         -0.8     0.920 g/cm2 AP Spine  L1-L4      09/22/2019    57.2         0.0     1.175 g/cm2 DualFemur Total Mean 09/22/2019    57.2         -0.4    0.957 g/cm2 World Health Organization [facility]) criteria for post-menopausal, Caucasian Women: Normal       T-score at or above -1 SD Osteopenia   T-score between -1 and -2.5 SD Osteoporosis T-score at or below -2.5 SD RECOMMENDATION: 1. All patients should optimize calcium and vitamin D intake. 2. Consider FDA approved medical therapies in postmenopausal women and men aged 25 years and older, based on the following: a. A hip or vertebral (clinical or morphometric) fracture b. T- score < or = -2.5 at the femoral neck or spine after appropriate evaluation to exclude secondary causes c. Low bone mass (T-score between -1.0 and -2.5 at the femoral neck or spine) and a 10 year probability of a hip fracture > or = 3% or a 10 year probability of a major osteoporosis-related fracture > or = 20% based on the [place]-adapted WHO algorithm d. Clinician judgment and/or patient preferences may indicate treatment for people with 10-year fracture probabilities above or below these levels FOLLOW-UP: Patients with diagnosis of osteoporosis or at high risk for fracture should have regular bone mineral density tests. For patients eligible for Medicare, routine testing is allowed once every 2 years. The testing frequency can be increased to one year for patients who have rapidly progressing disease, those who are receiving or discontinuing medical therapy to restore bone mass, or have additional risk factors. I have reviewed this report and agree with the above findings. [facility] Radiology Electronically Signed   By: Lowella Grip III M.D.   On: 09/22/2019 15:24   MM DIAG BREAST TOMO BILATERAL  Result Date: 09/22/2019 CLINICAL DATA:  The patient had lumpectomies of the RIGHT breast, 5:30 o'clock and 10 o'clock location and RIGHT axillary node dissection in August 2020 following neoadjuvant  treatment.Patient takes Arimidex. EXAM: DIGITAL DIAGNOSTIC BILATERAL MAMMOGRAM WITH CAD AND TOMO COMPARISON:  10/27/2018 and earlier ACR Breast Density Category b: There are scattered areas of fibroglandular density. FINDINGS: There is skin and trabecular thickening in the RIGHT breast, consistent with previous radiation treatment. Postoperative changes are identified in the LOWER central portion of the RIGHT breast in the UPPER-OUTER QUADRANT consistent previous lumpectomies. LEFT breast is negative. Mammographic images were processed with CAD. IMPRESSION: Expected post treatment changes in the RIGHT breast. No mammographic evidence for malignancy. RECOMMENDATION: Diagnostic mammogram is suggested in 1 year. (Code:[id number]-01Y) I have discussed the findings and recommendations with the patient. If applicable, a reminder letter will be sent to the patient regarding the next appointment. BI-RADS CATEGORY  2: Benign. Electronically Signed   By: Nolon Nations M.D.   On: 09/22/2019 16:11     ELIGIBLE FOR AVAILABLE RESEARCH PROTOCOL: No   ASSESSMENT: 59 y.o. [place], [place] woman status post right breast biopsy x2 on 07/10/2018 for multicentric invasive ductal carcinoma, clinically T1c N1, stage IIB, grade 3, functionally triple negative, with an MIB-1 of 70%  (a) right axillary lymph node biopsied at the same time was positive  (1) neoadjuvant chemotherapy consisting of doxorubicin and cyclophosphamide in  dose dense fashion x4 started 08/07/2018, completed 09/16/2018, followed by weekly Abraxane and carboplatin x12 starting 08/07/2018, stopped after 2 cycles (last dose on 10/15/2018) due to peripheral neuropathy   (2) Right breast lumpectomy on 12/27/2018 shows a ypT1b ypN0 residual invasive ductal carcinoma, margins negative.  (a) 5 sentinel nodes were negative.   (b) Estrogen receptor 75% positive, weak, Progesterone receptor negative.  (3) adjuvant radiation  Radiation Treatment Dates: 02/18/2019 through  04/02/2019 Site Technique Total Dose (Gy) Dose per Fx (Gy) Completed Fx Beam Energies  Breast: Breast_Rt 3D 50.4/50.4 1.8 28/28 6X, 10X  Breast: Breast_Rt_axilla 3D 50.4/50.4 1.8 28/28 6X, 10X  Breast: Breast_Rt_Bst 3D 20/20 2 10/10 6X, 10X    (4) genetics testing 08/06/2018 through the Breast + GYN Cancers Panel offered by [facility] found no deleterious mutations in ATM, BARD1, BRCA1, BRCA2, BRIP1, CDH1, CHEK2, DICER1, EPCAM, MLH1, MSH2, MSH6, NBN, NF1, PALB2, PMS2, PTEN, RAD50, RAD51C, RAD51D, SMARCA4, STK11, TP53.   (5) anastrozole started 05/14/2018  (a) bone density 09/22/2019 shows a T score of -0.8 (normal).   PLAN: Philomina is now close to 1 year out from definitive surgery for her breast cancer with no evidence of disease recurrence.  This is favorable.  She is tolerating anastrozole well and the plan is to continue that a total of 5 years.  Her baseline bone density is favorable.  I am adding vitamin D to her medications.  I have encouraged her to walk for exercise.  I am concerned about her asthma.  It seems to be not well controlled right now despite her use of the albuterol inhaler.  I am writing a Medrol Dosepak for her and referring her to pulmonology for further evaluation and treatment at their discretion  I am also setting her up for a noncontrast CT of the abdomen/renal stone protocol.  We will call her with results  Assuming all goes well she will see me again in 6 months.  She knows to call for any other issue that may develop before the next visit.  Total encounter time 35 minutes.Sarajane Jews C. Raef Sprigg, MD  09/30/19 10:50 AM Medical Oncology and Hematology [facility] [place], [place] [address] Tel. [phone]    Fax. [phone]   ADDENDUM: Alesia CT does not show a kidney stone but it does show a left pleural effusion and also some difficult to interpret lesions in the liver.  I called her and discussed.  We are proceeding  with a left thoracentesis and liver MRI.  She will see me again on  I, Wilburn Mylar, am acting as scribe for Dr. Sarajane Jews C. Bobak Oguinn.  I, Lurline Del MD, have reviewed the above documentation for accuracy and completeness, and I agree with the above.   *Total Encounter Time as defined by the Centers for Medicare and Medicaid Services includes, in addition to the face-to-face time of a patient visit (documented in the note above) non-face-to-face time: obtaining and reviewing outside history, ordering and reviewing medications, tests or procedures, care coordination (communications with other health care professionals or caregivers) and documentation in the medical record.

## 2019-09-29 NOTE — Therapy (Signed)
[place], [place], [address] Phone: [phone]   Fax:  [phone]  Physical Therapy Treatment  Patient Details  Name: Autumn Cook MRN: [id number] Date of Birth: Sep 14, 1960 Referring Provider (PT): [name]    Encounter Date: 09/29/2019  PT End of Session - 09/29/19 1209    Visit Number  11    Number of Visits  25    Date for PT Re-Evaluation  10/20/19    PT Start Time  0805    PT Stop Time  0845    PT Time Calculation (min)  40 min    Activity Tolerance  Patient tolerated treatment well    Behavior During Therapy  [facility] for tasks assessed/performed       Past Medical History:  Diagnosis Date  . Allergy   . Arthritis   . Asthma   . Cancer [facility])    R breast cancer  . Diabetes mellitus without complication (HCC)    no meds now  . Family history of adverse reaction to anesthesia    "entire family takes a long time to wake up- N&V for several days"  . Family history of lung cancer   . Hypertension   . Migraine   . PONV (postoperative nausea and vomiting)     Past Surgical History:  Procedure Laterality Date  . BREAST CYST EXCISION  1994  . BREAST LUMPECTOMY WITH RADIOACTIVE SEED AND SENTINEL LYMPH NODE BIOPSY Right 12/27/2018   Procedure: RIGHT BREAST LUMPECTOMY WITH RADIOACTIVE SEED X2 AND RIGHT SEED TARGETED AXILLARY SENTINEL LYMPH NODE BIOPSY;  Surgeon: Alphonsa Overall, MD;  Location: [place];  Service: General;  Laterality: Right;  . DILATION AND CURETTAGE OF UTERUS    . PORTACATH PLACEMENT Right 08/01/2018   Procedure: INSERTION PORT-A-CATH WITH ULTRASOUND;  Surgeon: Alphonsa Overall, MD;  Location: [place];  Service: General;  Laterality: Right;    There were no vitals filed for this visit.  Subjective Assessment - 09/29/19 0817    Subjective  Pt reports she had an asthma attack last night.Once pt was taken in a room so that she could take her  mask off, she was able to breathe better.  She feels that it may have been from her air condidtioner. Vitals are WNL. Pt ok to exercise today. She got her Flexitouch machine and she has been using it.  She was advise to wait until the  fitter comes  She says she is having pain on the left side. but she can still walk with it. she is goint tomorrow to the doctor to get it checked out.  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  Yes    Pain Score  5     Pain Location  Back   nagging feeling   Pain Orientation  Left;Lower    Pain Descriptors / Indicators  Nagging    Pain Type  Acute pain    Pain Radiating Towards  does not radiate    Pain Onset  In the past 7 days  Pain Frequency  Intermittent    Aggravating Factors   walking    Pain Relieving Factors  rest    Effect of Pain on Daily Activities  pt is still walking through it                   Outpatient Re[facility] from [facility]/25/2019 [facility]  Lymphedema Life Impact Scale Total Score  44.12 %           [facility] Adult PT Treatment/Exercise - 09/29/19 0001      Exercises   Exercises  Shoulder;Lumbar;Knee/Hip      Lumbar Exercises: Seated   Other Seated Lumbar Exercises  on round disc, pelvic tilts, anterior/posterior and lateral       Lumbar Exercises: Supine   Bridge  --   pt unable to do bridge due to back pain    Straight Leg Raise  10 reps;Other (comment)    Straight Leg Raises Limitations  cues to tighten, lift, lower, release     Other Supine Lumbar Exercises  lower trunk rotation with easy stretch for back       Lumbar Exercises: Sidelying    Clam  Both;10 reps    Hip Abduction  Both;10 reps    Other Sidelying Lumbar Exercises  fire hydrants with each leg x 10 reps       Knee/Hip Exercises: Seated   Marching  Both;10 reps   on red disc for added core stabalization      Shoulder Exercises: Seated   Flexion  Strengthening;Both;10 reps;Weights   seated on disc   Flexion Weight (lbs)  2    Other Seated Exercises  bicep curls with 2# weight in each hand seated on disc       Shoulder Exercises: Sidelying   ABduction  Strengthening;Both;10 reps;Weights    ABduction Weight (lbs)  2               PT Short Term Goals - 09/17/19 1633      PT SHORT TERM GOAL #1   Title  Pt will report that she can manage her right breast lymphedema with self MLD and compression    Time  4    Period  Weeks    Status  On-going      PT SHORT TERM GOAL #2   Title  Pt will be independent in HEP for right shoulder    Status  Achieved      PT SHORT TERM GOAL #3   Title  Pt will report the pain in her right shoulder is decreased to 3/10    Baseline  6/10 on 06/25/2019    Time  4    Period  Weeks    Status  On-going        PT Long Term Goals - 09/17/19 1633      PT LONG TERM GOAL #1   Title  Pt will have 150 degrees of painfree right shoulder flexion    Baseline  128 on 06/25/2019,, 155 on 07/23/2019, 155 on 09/17/2019    Status  Achieved      PT LONG TERM GOAL #2   Title  Pt will decrease Quick DASH score to < 45 indicating an improvment in UE function    Baseline  65.91 on 06/25/2019    Time  8    Status  On-going      PT LONG TERM GOAL #3   Title  Pt will increase repeated sit to stand  to 10 in 30 seconds indicating an improvment in general strength    Baseline  8 in 30 seconds on 06/25/2019    Time  8    Period  Weeks    Status  On-going      PT LONG TERM GOAL #4   Title  Pt will decrase TUG score to < 7.5 indicating an improvment in fucntional mobility    Baseline  9.2 on 09/17/2019    Time  4    Period  Weeks     Status  New            Plan - 09/29/19 [id number]    Clinical Impression Statement  O2 sats 97 HR 82.  (pt reports she can do much better breathing without her mask on) BP 138/91  Upon arrival, pt had difficulty breathing and appeared in distress.  Pt taken to a room and was able to remove her mask, and pt was able to breathe easier. She was able to perform sitting, supine and sidelying exercises but was fatigued at end of session.  Pt hopes to be able to increase her exercise and will talk to her doctor tomorrow.    Comorbidities  breast surgery, chemotherapy with CIPN, radiation    Stability/Clinical Decision Making  Stable/Uncomplicated    Rehab Potential  Good    PT Frequency  2x / week    PT Treatment/Interventions  ADLs/Self Care Home Management;Therapeutic activities;Therapeutic exercise;Balance training;Neuromuscular re-education;Orthotic Fit/Training;Patient/family education;Manual techniques;Manual lymph drainage;Compression bandaging;Taping;Dry needling;Passive range of motion    PT Next Visit Plan  Check to see if she got Flexitouch instruction and add  LE strength and balance work each session monitor respiratory symptoms       Patient will benefit from skilled therapeutic intervention in order to improve the following deficits and impairments:  Decreased balance, Decreased endurance, Impaired perceived functional ability, Increased edema, Decreased range of motion, Decreased knowledge of precautions, Decreased strength, Increased fascial restricitons, Impaired UE functional use, Postural dysfunction, Pain  Visit Diagnosis: Lymphedema, not elsewhere classified  Aftercare following surgery for neoplasm  Muscle weakness (generalized)  Abnormal posture     Problem List Patient Active Problem List   Diagnosis Date Noted  . Genetic testing 08/13/2018  . Port-A-Cath in place 08/07/2018  . Family history of lung cancer   . Malignant neoplasm of overlapping sites of right  breast in female, estrogen receptor positive ([place]) 07/23/2018  . Uncontrolled diabetes mellitus type 2 without complications 123456  . Achilles tendonitis, bilateral 06/11/2015  . Chest pain with moderate risk for cardiac etiology 10/13/2014  . Hypercholesterolemia 10/13/2014  . Idiopathic scoliosis 08/18/2014  . Hypertension 09/11/2012  . Overweight (BMI 25.0-29.9) 09/11/2012  . Migraine headache 09/11/2012  . Hx of thyroid nodule 09/11/2012  . History of tobacco use 09/11/2012   Donato Heinz. Owens Shark PT  Norwood Levo 09/29/2019, 12:17 PM  [place], [place], [address] Phone: [phone]   Fax:  [phone]  Name: Autumn Cook MRN: [id number] Date of Birth: 1960/12/29

## 2019-09-30 ENCOUNTER — Other Ambulatory Visit: Payer: Self-pay | Admitting: Family Medicine

## 2019-09-30 ENCOUNTER — Encounter: Payer: Self-pay | Admitting: Oncology

## 2019-09-30 ENCOUNTER — Other Ambulatory Visit: Payer: Self-pay

## 2019-09-30 ENCOUNTER — Inpatient Hospital Stay: Payer: 59 | Attending: Oncology | Admitting: Oncology

## 2019-09-30 ENCOUNTER — Telehealth: Payer: Self-pay | Admitting: Oncology

## 2019-09-30 ENCOUNTER — Ambulatory Visit (HOSPITAL_BASED_OUTPATIENT_CLINIC_OR_DEPARTMENT_OTHER)
Admission: RE | Admit: 2019-09-30 | Discharge: 2019-09-30 | Disposition: A | Discharge status: Home / Self Care | Payer: 59 | Source: Ambulatory Visit | Attending: Oncology | Admitting: Oncology

## 2019-09-30 ENCOUNTER — Inpatient Hospital Stay: Payer: 59

## 2019-09-30 DIAGNOSIS — Z170 Estrogen receptor positive status [ER+]: Secondary | ICD-10-CM | POA: Diagnosis not present

## 2019-09-30 DIAGNOSIS — I10 Essential (primary) hypertension: Secondary | ICD-10-CM | POA: Diagnosis not present

## 2019-09-30 DIAGNOSIS — J45909 Unspecified asthma, uncomplicated: Secondary | ICD-10-CM | POA: Insufficient documentation

## 2019-09-30 DIAGNOSIS — R1012 Left upper quadrant pain: Secondary | ICD-10-CM | POA: Diagnosis present

## 2019-09-30 DIAGNOSIS — Z87891 Personal history of nicotine dependence: Secondary | ICD-10-CM | POA: Diagnosis not present

## 2019-09-30 DIAGNOSIS — IMO0001 Reserved for inherently not codable concepts without codable children: Secondary | ICD-10-CM

## 2019-09-30 DIAGNOSIS — E119 Type 2 diabetes mellitus without complications: Secondary | ICD-10-CM | POA: Insufficient documentation

## 2019-09-30 DIAGNOSIS — C773 Secondary and unspecified malignant neoplasm of axilla and upper limb lymph nodes: Secondary | ICD-10-CM | POA: Insufficient documentation

## 2019-09-30 DIAGNOSIS — Z801 Family history of malignant neoplasm of trachea, bronchus and lung: Secondary | ICD-10-CM | POA: Insufficient documentation

## 2019-09-30 DIAGNOSIS — Z79811 Long term (current) use of aromatase inhibitors: Secondary | ICD-10-CM | POA: Diagnosis not present

## 2019-09-30 DIAGNOSIS — E876 Hypokalemia: Secondary | ICD-10-CM | POA: Diagnosis not present

## 2019-09-30 DIAGNOSIS — K449 Diaphragmatic hernia without obstruction or gangrene: Secondary | ICD-10-CM | POA: Insufficient documentation

## 2019-09-30 DIAGNOSIS — E559 Vitamin D deficiency, unspecified: Secondary | ICD-10-CM

## 2019-09-30 DIAGNOSIS — K769 Liver disease, unspecified: Secondary | ICD-10-CM | POA: Insufficient documentation

## 2019-09-30 DIAGNOSIS — C50911 Malignant neoplasm of unspecified site of right female breast: Secondary | ICD-10-CM | POA: Diagnosis not present

## 2019-09-30 DIAGNOSIS — M129 Arthropathy, unspecified: Secondary | ICD-10-CM | POA: Diagnosis not present

## 2019-09-30 DIAGNOSIS — Z79899 Other long term (current) drug therapy: Secondary | ICD-10-CM | POA: Insufficient documentation

## 2019-09-30 DIAGNOSIS — C50811 Malignant neoplasm of overlapping sites of right female breast: Secondary | ICD-10-CM

## 2019-09-30 DIAGNOSIS — J90 Pleural effusion, not elsewhere classified: Secondary | ICD-10-CM | POA: Diagnosis not present

## 2019-09-30 LAB — CBC WITH DIFFERENTIAL/PLATELET
Abs Immature Granulocytes: 0.04 10*3/uL (ref 0.00–0.07)
Basophils Absolute: 0 10*3/uL (ref 0.0–0.1)
Basophils Relative: 0 %
Eosinophils Absolute: 0.1 10*3/uL (ref 0.0–0.5)
Eosinophils Relative: 2 %
HCT: 37.8 % (ref 36.0–46.0)
Hemoglobin: 12.7 g/dL (ref 12.0–15.0)
Immature Granulocytes: 1 %
Lymphocytes Relative: 14 %
Lymphs Abs: 1 10*3/uL (ref 0.7–4.0)
MCH: 29.4 pg (ref 26.0–34.0)
MCHC: 33.6 g/dL (ref 30.0–36.0)
MCV: 87.5 fL (ref 80.0–100.0)
Monocytes Absolute: 0.5 10*3/uL (ref 0.1–1.0)
Monocytes Relative: 7 %
Neutro Abs: 5.3 10*3/uL (ref 1.7–7.7)
Neutrophils Relative %: 76 %
Platelets: 316 10*3/uL (ref 150–400)
RBC: 4.32 MIL/uL (ref 3.87–5.11)
RDW: 13.2 % (ref 11.5–15.5)
WBC: 6.9 10*3/uL (ref 4.0–10.5)
nRBC: 0 % (ref 0.0–0.2)

## 2019-09-30 LAB — COMPREHENSIVE METABOLIC PANEL
ALT: 10 U/L (ref 0–44)
AST: 16 U/L (ref 15–41)
Albumin: 3.7 g/dL (ref 3.5–5.0)
Alkaline Phosphatase: 61 U/L (ref 38–126)
Anion gap: 10 (ref 5–15)
BUN: 14 mg/dL (ref 6–20)
CO2: 28 mmol/L (ref 22–32)
Calcium: 10 mg/dL (ref 8.9–10.3)
Chloride: 101 mmol/L (ref 98–111)
Creatinine, Ser: 0.91 mg/dL (ref 0.44–1.00)
GFR calc Af Amer: >60 mL/min (ref >60)
GFR calc non Af Amer: >60 mL/min (ref >60)
Glucose, Bld: 211 mg/dL — ABNORMAL HIGH (ref 70–99)
Potassium: 3.4 mmol/L — ABNORMAL LOW (ref 3.5–5.1)
Sodium: 139 mmol/L (ref 135–145)
Total Bilirubin: 0.4 mg/dL (ref 0.3–1.2)
Total Protein: 7.5 g/dL (ref 6.5–8.1)

## 2019-09-30 MED ORDER — PREDNISONE 5 MG PO TABS: ORAL_TABLET | ORAL | 0 refills | Status: DC

## 2019-09-30 MED ORDER — ANASTROZOLE 1 MG PO TABS: 1.0000 mg | ORAL_TABLET | Freq: Every day | ORAL | 4 refills | Status: DC

## 2019-09-30 MED ORDER — VITAMIN D 25 MCG (1000 UNIT) PO TABS: 1000.0000 [IU] | ORAL_TABLET | Freq: Every day | ORAL | 4 refills | Status: DC

## 2019-09-30 NOTE — Telephone Encounter (Signed)
Scheduled appt per 5/25 sch message. - pt is aware.

## 2019-09-30 NOTE — Addendum Note (Signed)
Addended by: Chauncey Cruel on: 09/30/2019 12:59 PM   Modules accepted: Orders

## 2019-10-01 ENCOUNTER — Telehealth: Payer: Self-pay | Admitting: Oncology

## 2019-10-01 ENCOUNTER — Other Ambulatory Visit (HOSPITAL_COMMUNITY)
Admission: RE | Admit: 2019-10-01 | Discharge: 2019-10-01 | Disposition: A | Discharge status: Home / Self Care | Payer: 59 | Source: Ambulatory Visit | Attending: Oncology | Admitting: Oncology

## 2019-10-01 ENCOUNTER — Ambulatory Visit: Payer: 59 | Admitting: Physical Therapy

## 2019-10-01 DIAGNOSIS — Z01812 Encounter for preprocedural laboratory examination: Secondary | ICD-10-CM | POA: Insufficient documentation

## 2019-10-01 DIAGNOSIS — Z20822 Contact with and (suspected) exposure to covid-19: Secondary | ICD-10-CM | POA: Diagnosis not present

## 2019-10-01 LAB — SARS CORONAVIRUS 2 (TAT 6-24 HRS): SARS Coronavirus 2: NEGATIVE

## 2019-10-01 NOTE — Telephone Encounter (Signed)
Called pt to schedule follow up per 5/25 los. Pt stated that some things have changed and that she did not want to schedule the six month follow up appt yet. She has another appt with GM scheduled in 1 week.

## 2019-10-02 ENCOUNTER — Inpatient Hospital Stay (HOSPITAL_BASED_OUTPATIENT_CLINIC_OR_DEPARTMENT_OTHER): Payer: 59 | Admitting: Medical

## 2019-10-02 ENCOUNTER — Other Ambulatory Visit: Payer: Self-pay | Admitting: Student

## 2019-10-02 ENCOUNTER — Inpatient Hospital Stay: Payer: 59

## 2019-10-02 ENCOUNTER — Encounter: Payer: Self-pay | Admitting: Oncology

## 2019-10-02 VITALS — BP 133/88 | HR 96 | Temp 97.5°F | Resp 19 | Ht 66.0 in | Wt 180.2 lb

## 2019-10-02 DIAGNOSIS — C50911 Malignant neoplasm of unspecified site of right female breast: Secondary | ICD-10-CM | POA: Diagnosis not present

## 2019-10-02 DIAGNOSIS — C50811 Malignant neoplasm of overlapping sites of right female breast: Secondary | ICD-10-CM | POA: Diagnosis not present

## 2019-10-02 DIAGNOSIS — R05 Cough: Secondary | ICD-10-CM | POA: Diagnosis not present

## 2019-10-02 DIAGNOSIS — J90 Pleural effusion, not elsewhere classified: Secondary | ICD-10-CM | POA: Diagnosis not present

## 2019-10-02 DIAGNOSIS — E876 Hypokalemia: Secondary | ICD-10-CM

## 2019-10-02 DIAGNOSIS — Z170 Estrogen receptor positive status [ER+]: Secondary | ICD-10-CM

## 2019-10-02 DIAGNOSIS — R059 Cough, unspecified: Secondary | ICD-10-CM

## 2019-10-02 DIAGNOSIS — IMO0001 Reserved for inherently not codable concepts without codable children: Secondary | ICD-10-CM

## 2019-10-02 LAB — CMP (CANCER CENTER ONLY)
ALT: 10 U/L (ref 0–44)
AST: 15 U/L (ref 15–41)
Albumin: 3.9 g/dL (ref 3.5–5.0)
Alkaline Phosphatase: 52 U/L (ref 38–126)
Anion gap: 12 (ref 5–15)
BUN: 19 mg/dL (ref 6–20)
CO2: 29 mmol/L (ref 22–32)
Calcium: 9.9 mg/dL (ref 8.9–10.3)
Chloride: 100 mmol/L (ref 98–111)
Creatinine: 0.89 mg/dL (ref 0.44–1.00)
GFR, Est AFR Am: >60 mL/min (ref >60)
GFR, Estimated: >60 mL/min (ref >60)
Glucose, Bld: 139 mg/dL — ABNORMAL HIGH (ref 70–99)
Potassium: 3.2 mmol/L — ABNORMAL LOW (ref 3.5–5.1)
Sodium: 141 mmol/L (ref 135–145)
Total Bilirubin: 0.2 mg/dL — ABNORMAL LOW (ref 0.3–1.2)
Total Protein: 7.4 g/dL (ref 6.5–8.1)

## 2019-10-02 LAB — CBC WITH DIFFERENTIAL/PLATELET
Abs Immature Granulocytes: 0.05 10*3/uL (ref 0.00–0.07)
Basophils Absolute: 0 10*3/uL (ref 0.0–0.1)
Basophils Relative: 0 %
Eosinophils Absolute: 0.1 10*3/uL (ref 0.0–0.5)
Eosinophils Relative: 1 %
HCT: 37.5 % (ref 36.0–46.0)
Hemoglobin: 12.7 g/dL (ref 12.0–15.0)
Immature Granulocytes: 1 %
Lymphocytes Relative: 18 %
Lymphs Abs: 1.5 10*3/uL (ref 0.7–4.0)
MCH: 30.2 pg (ref 26.0–34.0)
MCHC: 33.9 g/dL (ref 30.0–36.0)
MCV: 89.1 fL (ref 80.0–100.0)
Monocytes Absolute: 0.8 10*3/uL (ref 0.1–1.0)
Monocytes Relative: 9 %
Neutro Abs: 6.2 10*3/uL (ref 1.7–7.7)
Neutrophils Relative %: 71 %
Platelets: 356 10*3/uL (ref 150–400)
RBC: 4.21 MIL/uL (ref 3.87–5.11)
RDW: 13.1 % (ref 11.5–15.5)
WBC: 8.7 10*3/uL (ref 4.0–10.5)
nRBC: 0 % (ref 0.0–0.2)

## 2019-10-02 MED ORDER — POTASSIUM CHLORIDE ER 20 MEQ PO TBCR: 20.0000 meq | EXTENDED_RELEASE_TABLET | Freq: Every day | ORAL | 0 refills | Status: DC

## 2019-10-02 MED ORDER — BENZONATATE 100 MG PO CAPS: 200.0000 mg | ORAL_CAPSULE | Freq: Three times a day (TID) | ORAL | 1 refills | Status: DC | PRN

## 2019-10-02 MED ORDER — ADVAIR HFA 115-21 MCG/ACT IN AERO: 2.0000 | INHALATION_SPRAY | Freq: Two times a day (BID) | RESPIRATORY_TRACT | 12 refills | Status: AC

## 2019-10-02 MED ORDER — GUAIFENESIN-CODEINE 100-10 MG/5ML PO SOLN: 5.0000 mL | ORAL | 0 refills | Status: DC | PRN

## 2019-10-02 MED ORDER — LEVOFLOXACIN 500 MG PO TABS: 500.0000 mg | ORAL_TABLET | Freq: Every day | ORAL | 0 refills | Status: DC

## 2019-10-03 ENCOUNTER — Ambulatory Visit (HOSPITAL_COMMUNITY)
Admission: RE | Admit: 2019-10-03 | Discharge: 2019-10-03 | Disposition: A | Discharge status: Home / Self Care | Payer: 59 | Source: Ambulatory Visit | Attending: Oncology | Admitting: Oncology

## 2019-10-03 ENCOUNTER — Other Ambulatory Visit: Payer: Self-pay

## 2019-10-03 ENCOUNTER — Ambulatory Visit (HOSPITAL_COMMUNITY)
Admission: RE | Admit: 2019-10-03 | Discharge: 2019-10-03 | Disposition: A | Discharge status: Home / Self Care | Payer: 59 | Source: Ambulatory Visit | Attending: Student | Admitting: Student

## 2019-10-03 DIAGNOSIS — Z9889 Other specified postprocedural states: Secondary | ICD-10-CM

## 2019-10-03 DIAGNOSIS — J90 Pleural effusion, not elsewhere classified: Secondary | ICD-10-CM | POA: Insufficient documentation

## 2019-10-03 DIAGNOSIS — R1012 Left upper quadrant pain: Secondary | ICD-10-CM | POA: Insufficient documentation

## 2019-10-03 LAB — BODY FLUID CELL COUNT WITH DIFFERENTIAL
Lymphs, Fluid: 46 %
Monocyte-Macrophage-Serous Fluid: 34 % — ABNORMAL LOW (ref 50–90)
Neutrophil Count, Fluid: 20 % (ref 0–25)
Total Nucleated Cell Count, Fluid: 3118 cu mm — ABNORMAL HIGH (ref 0–1000)

## 2019-10-03 LAB — PROTEIN, PLEURAL OR PERITONEAL FLUID: Total protein, fluid: 5.4 g/dL

## 2019-10-03 LAB — LACTATE DEHYDROGENASE, PLEURAL OR PERITONEAL FLUID: LD, Fluid: 1473 U/L — ABNORMAL HIGH (ref 3–23)

## 2019-10-03 MED ORDER — LIDOCAINE HCL 1 % IJ SOLN
INTRAMUSCULAR | Status: AC
  Filled 2019-10-03: qty 20

## 2019-10-03 NOTE — Progress Notes (Signed)
[facility] Progress Note   Autumn Cook [id number] 1961/01/29 59 y.o.  Autumn Cook is managed by [name]  Actively treated with chemotherapy/immunotherapy/hormonal therapy: yes  Current therapy: Anastrozole  Next scheduled appointment with provider: 10/10/2019  Assessment: Plan:    Cough - Plan: fluticasone-salmeterol (ADVAIR HFA) 115-21 MCG/ACT inhaler, guaiFENesin-codeine 100-10 MG/5ML syrup, benzonatate (TESSALON) 100 MG capsule, levofloxacin (LEVAQUIN) 500 MG tablet  Hypokalemia - Plan: Potassium Chloride ER 20 MEQ TBCR  Malignant neoplasm of overlapping sites of right breast in female, estrogen receptor positive ([place])  Pleural effusion on left   Cough: The patient was told to stop her steroid taper given her documented elevation of her blood sugar.  She was given an Advair inhaler with instructions to use 2 puffs twice daily.  She will continue using her albuterol inhaler.  She was also given a prescription for Robitussin with codeine, Tessalon Perles, and Levaquin 500 mg p.o. once daily x7 days.  Hypokalemia: A chemistry panel returned with a potassium of 3.2 today.  The patient was given a prescription for potassium chloride 20 mEq once daily.  Her labs will be rechecked on her return on 10/16/2019.  Multicentric invasive ductal carcinoma, clinically T1c N1, stage IIB, grade 3, functionally triple negative, with an MIB-1 of 70% the patient continues to be managed by [name] and is currently on anastrozole.  She will return for follow-up on 10/10/2019.  She will have a left thoracentesis completed tomorrow morning.  Please see After Visit Summary for patient specific instructions.  Future Appointments  Date Time Provider [place]  10/04/2019 11:00 AM [facility] 1 [facility] [place]  10/08/2019 10:00 AM Ander Purpura, PT [facility] None  10/10/2019 10:00 AM [facility] 6 [facility] None  10/10/2019 10:30 AM Magrinat, Virgie Dad, MD  [facility] None  10/14/2019 10:00 AM Ander Purpura, PT [facility] None  10/16/2019  9:00 AM Ander Purpura, PT [facility] None  10/20/2019  9:00 AM Tevis, Adrian Prince, PT [facility] None  10/22/2019  9:00 AM Ander Purpura, PT [facility] None  10/27/2019  9:00 AM Kipp Laurence, PT [facility] None  10/28/2019  3:30 PM Delice Bison, Charlestine Massed, NP [facility] None  10/29/2019  9:00 AM Ander Purpura, PT [facility] None  11/12/2019  9:30 AM Rigoberto Noel, MD [facility] None    No orders of the defined types were placed in this encounter.      Subjective:   Patient ID:  Autumn Cook is a 59 y.o. (DOB 08/27/1960) female.  Chief Complaint: No chief complaint on file.   HPI [name]  is a 59 y.o. female with a diagnosis of a multicentric invasive ductal carcinoma, clinically T1c N1, stage IIB, grade 3, functionally triple negative, with an MIB-1 of 70% the patient continues to be managed by [name] and is currently on anastrozole.  She presents to the clinic today with an ongoing significant cough which interferes with her activities of daily living due to precipitating shortness of breath when she is coughing.  She is scheduled to have a left thoracentesis done tomorrow.  She denies fevers, chills, sweats, nausea, or vomiting.  Medications: I have reviewed the patient's current medications.  Allergies:  Allergies  Allergen Reactions  . Penicillins Hives    Did it involve swelling of the face/tongue/throat, SOB, or low BP? Yes--syncope episode Did it involve sudden or severe rash/hives, skin peeling, or any reaction on the inside of your mouth or nose? No  Did you need to seek medical attention at a hospital or doctor's office? Yes When did it last happen? 37-38 YEARS AGO If all above answers are "NO", may proceed with cephalosporin use.   . Metformin And Related Diarrhea  . Adhesive [Tape] Other (See Comments)    "severe bruising"  ALSO SKIN GLUE  . Asa [Aspirin] Other  (See Comments)    GI upset     Past Medical History:  Diagnosis Date  . Allergy   . Arthritis   . Asthma   . Cancer [facility])    R breast cancer  . Diabetes mellitus without complication (HCC)    no meds now  . Family history of adverse reaction to anesthesia    "entire family takes a long time to wake up- N&V for several days"  . Family history of lung cancer   . Hypertension   . Migraine   . PONV (postoperative nausea and vomiting)     Past Surgical History:  Procedure Laterality Date  . BREAST CYST EXCISION  1994  . BREAST LUMPECTOMY WITH RADIOACTIVE SEED AND SENTINEL LYMPH NODE BIOPSY Right 12/27/2018   Procedure: RIGHT BREAST LUMPECTOMY WITH RADIOACTIVE SEED X2 AND RIGHT SEED TARGETED AXILLARY SENTINEL LYMPH NODE BIOPSY;  Surgeon: Alphonsa Overall, MD;  Location: [place];  Service: General;  Laterality: Right;  . DILATION AND CURETTAGE OF UTERUS    . PORTACATH PLACEMENT Right 08/01/2018   Procedure: INSERTION PORT-A-CATH WITH ULTRASOUND;  Surgeon: Alphonsa Overall, MD;  Location: [place];  Service: General;  Laterality: Right;    Family History  Problem Relation Age of Onset  . Hypertension Father   . Cancer Father 18       lung cancer  . Stroke Father   . Heart disease Mother        arrhythmia  . Hypertension Mother   . Kidney failure Sister     Social History   Socioeconomic History  . Marital status: Married    Spouse name: Not on file  . Number of children: Not on file  . Years of education: Not on file  . Highest education level: Not on file  Occupational History  . Occupation: real Geophysicist/field seismologist: [employer]  Tobacco Use  . Smoking status: Former Smoker    Packs/day: 1.00    Years: 5.00    Pack years: 5.00    Quit date: 05/08/1988    Years since quitting: 31.4  . Smokeless tobacco: Never Used  Substance and Sexual Activity  . Alcohol use: Yes    Comment: social  . Drug use: No  . Sexual activity:  Not on file  Other Topics Concern  . Not on file  Social History Narrative   married   Social Determinants of Health   Financial Resource Strain:   . Difficulty of Paying Living Expenses:   Food Insecurity:   . Worried About Charity fundraiser in the Last Year:   . Arboriculturist in the Last Year:   Transportation Needs:   . Film/video editor (Medical):   Marland Kitchen Lack of Transportation (Non-Medical):   Physical Activity:   . Days of Exercise per Week:   . Minutes of Exercise per Session:   Stress:   . Feeling of Stress :   Social Connections:   . Frequency of Communication with Friends and Family:   . Frequency of Social Gatherings with Friends and Family:   .  Attends Religious Services:   . Active Member of Clubs or Organizations:   . Attends Archivist Meetings:   Marland Kitchen Marital Status:   Intimate Partner Violence:   . Fear of Current or Ex-Partner:   . Emotionally Abused:   Marland Kitchen Physically Abused:   . Sexually Abused:     Past Medical History, Surgical history, Social history, and Family history were reviewed and updated as appropriate.   Please see review of systems for further details on the patient's review from today.   Review of Systems:  Review of Systems  Constitutional: Negative for chills, diaphoresis and fever.       Mr. Afshar has a significant cough.  HENT: Negative for trouble swallowing.   Respiratory: Positive for cough and shortness of breath. Negative for choking, chest tightness, wheezing and stridor.   Cardiovascular: Negative for chest pain and palpitations.    Objective:   Physical Exam:  BP 133/88 (BP Location: Left Arm, Patient Position: Sitting) Comment: retake notified nurse  Pulse 96   Temp (!) 97.5 F (36.4 C) (Temporal)   Resp 19   Ht 5\' 6"  (1.676 m)   Wt 81.7 kg (180 lb 3.2 oz)   LMP 11/14/2013   SpO2 98%   BMI 29.09 kg/m  ECOG: 1  Physical Exam Constitutional:      General: She is not in acute distress.     Appearance: She is not diaphoretic.  HENT:     Head: Normocephalic and atraumatic.     Right Ear: External ear normal.     Left Ear: External ear normal.     Mouth/Throat:     Pharynx: No oropharyngeal exudate.  Cardiovascular:     Rate and Rhythm: Normal rate and regular rhythm.     Heart sounds: Normal heart sounds. No murmur. No friction rub. No gallop.   Pulmonary:     Effort: Pulmonary effort is normal.     Breath sounds: Examination of the left-upper field reveals decreased breath sounds. Examination of the left-middle field reveals decreased breath sounds. Examination of the left-lower field reveals decreased breath sounds. Decreased breath sounds present.  Musculoskeletal:     Cervical back: Normal range of motion and neck supple.  Lymphadenopathy:     Cervical: No cervical adenopathy.  Skin:    General: Skin is warm and dry.     Findings: No erythema or rash.  Neurological:     Mental Status: She is alert.     Coordination: Coordination normal.  Psychiatric:        Mood and Affect: Mood normal.        Behavior: Behavior normal.        Thought Content: Thought content normal.        Judgment: Judgment normal.     Lab Review:     Component Value Date/Time   NA 141 10/02/2019 1416   K 3.2 (L) 10/02/2019 1416   CL 100 10/02/2019 1416   CO2 29 10/02/2019 1416   GLUCOSE 139 (H) 10/02/2019 1416   BUN 19 10/02/2019 1416   CREATININE 0.89 10/02/2019 1416   CREATININE 0.70 04/14/2015 1558   CALCIUM 9.9 10/02/2019 1416   PROT 7.4 10/02/2019 1416   ALBUMIN 3.9 10/02/2019 1416   AST 15 10/02/2019 1416   ALT 10 10/02/2019 1416   ALKPHOS 52 10/02/2019 1416   BILITOT 0.2 (L) 10/02/2019 1416   GFRNONAA >60 10/02/2019 1416   GFRNONAA 87 03/27/2014 1130   GFRAA >60 10/02/2019 1416  GFRAA >89 03/27/2014 1130       Component Value Date/Time   WBC 8.7 10/02/2019 1416   RBC 4.21 10/02/2019 1416   HGB 12.7 10/02/2019 1416   HGB 12.9 07/24/2018 0825   HCT 37.5 10/02/2019  1416   PLT 356 10/02/2019 1416   PLT 342 07/24/2018 0825   MCV 89.1 10/02/2019 1416   MCV 86.8 12/06/2011 1042   MCH 30.2 10/02/2019 1416   MCHC 33.9 10/02/2019 1416   RDW 13.1 10/02/2019 1416   LYMPHSABS 1.5 10/02/2019 1416   MONOABS 0.8 10/02/2019 1416   EOSABS 0.1 10/02/2019 1416   BASOSABS 0.0 10/02/2019 1416   -------------------------------  Imaging from last 24 hours (if applicable):  Radiology interpretation: DG Chest 1 View  Result Date: 10/03/2019 CLINICAL DATA:  Status post left thoracentesis. EXAM: CHEST  1 VIEW COMPARISON:  11/18/2018. FINDINGS: No PowerPort catheter noted on today's exam. Heart size normal. Left lower lobe atelectasis/infiltrate. Moderate left-sided pleural effusion. No pneumothorax post thoracentesis. IMPRESSION: No pneumothorax post thoracentesis. Electronically Signed   By: Marcello Moores  Register   On: 10/03/2019 12:11   DG Bone Density  Result Date: 09/22/2019 EXAM: DUAL X-RAY ABSORPTIOMETRY (DXA) FOR BONE MINERAL DENSITY IMPRESSION: Referring Physician:  Chauncey Cruel Your patient completed a BMD test using Lunar IDXA DXA system ( analysis version: 16 ) manufactured by [employer]. Technologist: WLS PATIENT: Name: Cambry, Deng Patient ID: [id number] Birth Date: Apr 01, 1961 Height: 66.5 in. Sex: Female Measured: 09/22/2019 Weight: 181.5 lbs. Indications: Arimidex, Breast Cancer History, Estrogen Deficient, Low Calcium Intake (269.3), Postmenopausal Fractures: None Treatments: Vitamin D (E933.5) ASSESSMENT: The BMD measured at Femur Neck Right is 0.920 g/cm2 with a T-score of -0.8. This patient is considered normal according to [facility]) criteria. The scan quality is good. Site Region Measured Date Measured Age YA BMD Significant CHANGE T-score DualFemur Neck Right 09/22/2019    57.2         -0.8    0.920 g/cm2 AP Spine  L1-L4      09/22/2019    57.2         0.0     1.175 g/cm2 DualFemur Total Mean 09/22/2019    57.2         -0.4     0.957 g/cm2 World Health Organization [facility]) criteria for post-menopausal, Caucasian Women: Normal       T-score at or above -1 SD Osteopenia   T-score between -1 and -2.5 SD Osteoporosis T-score at or below -2.5 SD RECOMMENDATION: 1. All patients should optimize calcium and vitamin D intake. 2. Consider FDA approved medical therapies in postmenopausal women and men aged 67 years and older, based on the following: a. A hip or vertebral (clinical or morphometric) fracture b. T- score < or = -2.5 at the femoral neck or spine after appropriate evaluation to exclude secondary causes c. Low bone mass (T-score between -1.0 and -2.5 at the femoral neck or spine) and a 10 year probability of a hip fracture > or = 3% or a 10 year probability of a major osteoporosis-related fracture > or = 20% based on the [place]-adapted WHO algorithm d. Clinician judgment and/or patient preferences may indicate treatment for people with 10-year fracture probabilities above or below these levels FOLLOW-UP: Patients with diagnosis of osteoporosis or at high risk for fracture should have regular bone mineral density tests. For patients eligible for Medicare, routine testing is allowed once every 2 years. The testing frequency can be increased to one year for  patients who have rapidly progressing disease, those who are receiving or discontinuing medical therapy to restore bone mass, or have additional risk factors. I have reviewed this report and agree with the above findings. [facility] Radiology Electronically Signed   By: Lowella Grip III M.D.   On: 09/22/2019 15:24   MM DIAG BREAST TOMO BILATERAL  Result Date: 09/22/2019 CLINICAL DATA:  The patient had lumpectomies of the RIGHT breast, 5:30 o'clock and 10 o'clock location and RIGHT axillary node dissection in August 2020 following neoadjuvant treatment.Patient takes Arimidex. EXAM: DIGITAL DIAGNOSTIC BILATERAL MAMMOGRAM WITH CAD AND TOMO COMPARISON:  10/27/2018 and earlier ACR Breast  Density Category b: There are scattered areas of fibroglandular density. FINDINGS: There is skin and trabecular thickening in the RIGHT breast, consistent with previous radiation treatment. Postoperative changes are identified in the LOWER central portion of the RIGHT breast in the UPPER-OUTER QUADRANT consistent previous lumpectomies. LEFT breast is negative. Mammographic images were processed with CAD. IMPRESSION: Expected post treatment changes in the RIGHT breast. No mammographic evidence for malignancy. RECOMMENDATION: Diagnostic mammogram is suggested in 1 year. (Code:[id number]-01Y) I have discussed the findings and recommendations with the patient. If applicable, a reminder letter will be sent to the patient regarding the next appointment. BI-RADS CATEGORY  2: Benign. Electronically Signed   By: Nolon Nations M.D.   On: 09/22/2019 16:11   CT RENAL STONE STUDY  Result Date: 09/30/2019 CLINICAL DATA:  Left-sided flank pain.  Kidney stones suspected. EXAM: CT ABDOMEN AND PELVIS WITHOUT CONTRAST TECHNIQUE: Multidetector CT imaging of the abdomen and pelvis was performed following the standard protocol without IV contrast. COMPARISON:  None. FINDINGS: Lower chest: Left pleural effusion. There is opacity at the left lung base, most confluent in the left lower lobe, likely atelectasis. Previous right lumpectomy with evidence of a postoperative fluid collection measuring approximately 3 x 1.3 cm in size, and overlying skin thickening. Hepatobiliary: Liver normal in size and overall attenuation. There is a well-defined hypoattenuating lesion in the central liver measuring 3.5 cm, consistent with a cyst. There is an adjacent less well-defined area of hypoattenuation in the anterior aspect of segment 4 B, approximately 2.8 cm transversely, which may reflect focal fatty infiltration, but is nonspecific. There is a smaller similar appearing subtle area of hypoattenuation in the right lobe, segment 7, measuring 1.9 cm.  No other liver abnormalities. Normal gallbladder. No bile duct dilation. Pancreas: Unremarkable. No pancreatic ductal dilatation or surrounding inflammatory changes. Spleen: Normal in size without focal abnormality. Adrenals/Urinary Tract: No adrenal masses. Kidneys are normal in size, orientation and position. No renal masses, stones or hydronephrosis. Normal ureters. Normal bladder. Stomach/Bowel: Small hiatal hernia. Stomach otherwise unremarkable. Normal small bowel. Colon is normal in caliber. There are scattered colonic diverticula, mostly on the left. No colonic wall thickening or inflammation. Normal appendix visualized. Vascular/Lymphatic: Minor aortic atherosclerosis. No aneurysm. No enlarged lymph nodes. Reproductive: Uterus normal in size. Subserosal fibroid, left posterior lower uterine segment, not well-defined but approximately 2 cm in size. No adnexal masses. Other: No abdominal wall hernia or abnormality. No abdominopelvic ascites. Musculoskeletal: No fracture or acute finding. No osteoblastic or osteolytic lesions. IMPRESSION: 1. No acute findings within the abdomen or pelvis. No findings to account for left-sided flank pain. No renal or ureteral stones or obstructive uropathy. 2. Left pleural effusion. Left lung base parenchymal opacity consistent with atelectasis or pneumonia or a combination. 3. Two subtle hypoattenuating lesions in the liver, 1 in the right lobe and the other in the left, not  well characterized, with metastatic/neoplastic disease not excluded. There is also a more well-defined and lower attenuation hypoattenuating mass in the liver that is likely a cyst. Given the patient's history of breast carcinoma, follow-up liver MRI without and with contrast is recommended to further characterize the liver lesions. 4. Scattered left colon diverticula without diverticulitis. 5. Minor aortic atherosclerotic calcifications. Electronically Signed   By: Lajean Manes M.D.   On: 09/30/2019  12:04   US Thoracentesis Asp Pleural space w/IMG guide  Result Date: 10/03/2019 INDICATION: History of breast cancer. Left-sided pleural effusion. Request for diagnostic and therapeutic thoracentesis. EXAM: ULTRASOUND GUIDED LEFT THORACENTESIS MEDICATIONS: 1% plain lidocaine, 5 mL COMPLICATIONS: None immediate. Postprocedural chest x-ray negative for pneumothorax. PROCEDURE: An ultrasound guided thoracentesis was thoroughly discussed with the patient and questions answered. The benefits, risks, alternatives and complications were also discussed. The patient understands and wishes to proceed with the procedure. Written consent was obtained. Ultrasound was performed to localize and mark an adequate pocket of fluid in the left chest. The area was then prepped and draped in the normal sterile fashion. 1% Lidocaine was used for local anesthesia. Under ultrasound guidance a 6 Fr Safe-T-Centesis catheter was introduced. Thoracentesis was performed. The catheter was removed and a dressing applied. FINDINGS: A total of approximately 950 mL of hazy serosanguineous fluid was removed. Samples were sent to the laboratory as requested by the clinical team. IMPRESSION: Successful ultrasound guided left thoracentesis yielding 950 mL of pleural fluid. Read by: Ascencion Dike PA-C Electronically Signed   By: Corrie Mckusick D.O.   On: 10/03/2019 12:43

## 2019-10-04 ENCOUNTER — Ambulatory Visit (HOSPITAL_COMMUNITY)
Admission: RE | Admit: 2019-10-04 | Discharge: 2019-10-04 | Disposition: A | Discharge status: Home / Self Care | Payer: 59 | Source: Ambulatory Visit | Attending: Oncology | Admitting: Oncology

## 2019-10-04 DIAGNOSIS — R1012 Left upper quadrant pain: Secondary | ICD-10-CM | POA: Diagnosis present

## 2019-10-04 MED ORDER — GADOBUTROL 1 MMOL/ML IV SOLN
8.0000 mL | Freq: Once | INTRAVENOUS | Status: AC | PRN
  Administered 2019-10-04: 8 mL via INTRAVENOUS

## 2019-10-05 ENCOUNTER — Encounter: Payer: Self-pay | Admitting: Oncology

## 2019-10-06 LAB — BODY FLUID CULTURE
Culture: NO GROWTH
Gram Stain: NONE SEEN

## 2019-10-07 ENCOUNTER — Other Ambulatory Visit: Payer: Self-pay | Admitting: *Deleted

## 2019-10-07 ENCOUNTER — Other Ambulatory Visit: Payer: Self-pay | Admitting: Oncology

## 2019-10-07 NOTE — Progress Notes (Signed)
I called Autumn Cook and gave her the results of her liver MRI which unfortunately are suspicious for metastatic disease there.  The serosanguineous fluid drawn from her lungs 10/03/2019 were sent for cytology but that is still pending.  We should have a definitive diagnosis by the time she returns to see me 10/10/2019

## 2019-10-09 ENCOUNTER — Emergency Department (HOSPITAL_BASED_OUTPATIENT_CLINIC_OR_DEPARTMENT_OTHER): Payer: 59

## 2019-10-09 ENCOUNTER — Encounter (HOSPITAL_BASED_OUTPATIENT_CLINIC_OR_DEPARTMENT_OTHER): Payer: Self-pay

## 2019-10-09 ENCOUNTER — Inpatient Hospital Stay (HOSPITAL_BASED_OUTPATIENT_CLINIC_OR_DEPARTMENT_OTHER)
Admission: EM | Admit: 2019-10-09 | Discharge: 2019-10-14 | DRG: 597 | Disposition: A | Discharge status: Home / Self Care | Payer: 59 | Attending: Internal Medicine | Admitting: Internal Medicine

## 2019-10-09 ENCOUNTER — Other Ambulatory Visit: Payer: Self-pay

## 2019-10-09 DIAGNOSIS — Z79899 Other long term (current) drug therapy: Secondary | ICD-10-CM

## 2019-10-09 DIAGNOSIS — Z923 Personal history of irradiation: Secondary | ICD-10-CM | POA: Diagnosis not present

## 2019-10-09 DIAGNOSIS — Z801 Family history of malignant neoplasm of trachea, bronchus and lung: Secondary | ICD-10-CM | POA: Diagnosis not present

## 2019-10-09 DIAGNOSIS — G8929 Other chronic pain: Secondary | ICD-10-CM | POA: Diagnosis present

## 2019-10-09 DIAGNOSIS — J45909 Unspecified asthma, uncomplicated: Secondary | ICD-10-CM | POA: Diagnosis not present

## 2019-10-09 DIAGNOSIS — Z9221 Personal history of antineoplastic chemotherapy: Secondary | ICD-10-CM

## 2019-10-09 DIAGNOSIS — J449 Chronic obstructive pulmonary disease, unspecified: Secondary | ICD-10-CM | POA: Diagnosis present

## 2019-10-09 DIAGNOSIS — J90 Pleural effusion, not elsewhere classified: Secondary | ICD-10-CM

## 2019-10-09 DIAGNOSIS — Z823 Family history of stroke: Secondary | ICD-10-CM | POA: Diagnosis not present

## 2019-10-09 DIAGNOSIS — J9601 Acute respiratory failure with hypoxia: Secondary | ICD-10-CM | POA: Diagnosis present

## 2019-10-09 DIAGNOSIS — C787 Secondary malignant neoplasm of liver and intrahepatic bile duct: Secondary | ICD-10-CM | POA: Diagnosis present

## 2019-10-09 DIAGNOSIS — Z20822 Contact with and (suspected) exposure to covid-19: Secondary | ICD-10-CM | POA: Diagnosis present

## 2019-10-09 DIAGNOSIS — K5900 Constipation, unspecified: Secondary | ICD-10-CM | POA: Diagnosis not present

## 2019-10-09 DIAGNOSIS — N179 Acute kidney failure, unspecified: Secondary | ICD-10-CM | POA: Diagnosis present

## 2019-10-09 DIAGNOSIS — J910 Malignant pleural effusion: Secondary | ICD-10-CM | POA: Insufficient documentation

## 2019-10-09 DIAGNOSIS — Z170 Estrogen receptor positive status [ER+]: Secondary | ICD-10-CM

## 2019-10-09 DIAGNOSIS — Z938 Other artificial opening status: Secondary | ICD-10-CM

## 2019-10-09 DIAGNOSIS — Z87891 Personal history of nicotine dependence: Secondary | ICD-10-CM | POA: Diagnosis not present

## 2019-10-09 DIAGNOSIS — Z79811 Long term (current) use of aromatase inhibitors: Secondary | ICD-10-CM

## 2019-10-09 DIAGNOSIS — R0602 Shortness of breath: Secondary | ICD-10-CM | POA: Diagnosis not present

## 2019-10-09 DIAGNOSIS — Z886 Allergy status to analgesic agent status: Secondary | ICD-10-CM

## 2019-10-09 DIAGNOSIS — Z841 Family history of disorders of kidney and ureter: Secondary | ICD-10-CM

## 2019-10-09 DIAGNOSIS — G893 Neoplasm related pain (acute) (chronic): Secondary | ICD-10-CM | POA: Diagnosis not present

## 2019-10-09 DIAGNOSIS — Z888 Allergy status to other drugs, medicaments and biological substances status: Secondary | ICD-10-CM | POA: Diagnosis not present

## 2019-10-09 DIAGNOSIS — R0902 Hypoxemia: Secondary | ICD-10-CM

## 2019-10-09 DIAGNOSIS — E1165 Type 2 diabetes mellitus with hyperglycemia: Secondary | ICD-10-CM | POA: Diagnosis present

## 2019-10-09 DIAGNOSIS — Z452 Encounter for adjustment and management of vascular access device: Secondary | ICD-10-CM | POA: Diagnosis not present

## 2019-10-09 DIAGNOSIS — Z8249 Family history of ischemic heart disease and other diseases of the circulatory system: Secondary | ICD-10-CM

## 2019-10-09 DIAGNOSIS — C50811 Malignant neoplasm of overlapping sites of right female breast: Secondary | ICD-10-CM | POA: Diagnosis present

## 2019-10-09 DIAGNOSIS — C773 Secondary and unspecified malignant neoplasm of axilla and upper limb lymph nodes: Secondary | ICD-10-CM | POA: Diagnosis not present

## 2019-10-09 DIAGNOSIS — Z9889 Other specified postprocedural states: Secondary | ICD-10-CM

## 2019-10-09 DIAGNOSIS — I158 Other secondary hypertension: Secondary | ICD-10-CM | POA: Diagnosis not present

## 2019-10-09 DIAGNOSIS — J9600 Acute respiratory failure, unspecified whether with hypoxia or hypercapnia: Secondary | ICD-10-CM | POA: Diagnosis not present

## 2019-10-09 DIAGNOSIS — I10 Essential (primary) hypertension: Secondary | ICD-10-CM | POA: Diagnosis present

## 2019-10-09 DIAGNOSIS — C50919 Malignant neoplasm of unspecified site of unspecified female breast: Secondary | ICD-10-CM | POA: Diagnosis not present

## 2019-10-09 DIAGNOSIS — R42 Dizziness and giddiness: Secondary | ICD-10-CM | POA: Diagnosis not present

## 2019-10-09 DIAGNOSIS — C50911 Malignant neoplasm of unspecified site of right female breast: Secondary | ICD-10-CM | POA: Diagnosis not present

## 2019-10-09 DIAGNOSIS — Z880 Allergy status to penicillin: Secondary | ICD-10-CM

## 2019-10-09 DIAGNOSIS — E118 Type 2 diabetes mellitus with unspecified complications: Secondary | ICD-10-CM | POA: Diagnosis not present

## 2019-10-09 DIAGNOSIS — C7981 Secondary malignant neoplasm of breast: Secondary | ICD-10-CM | POA: Diagnosis not present

## 2019-10-09 LAB — BASIC METABOLIC PANEL
Anion gap: 13 (ref 5–15)
BUN: 23 mg/dL — ABNORMAL HIGH (ref 6–20)
CO2: 26 mmol/L (ref 22–32)
Calcium: 9.5 mg/dL (ref 8.9–10.3)
Chloride: 97 mmol/L — ABNORMAL LOW (ref 98–111)
Creatinine, Ser: 1.12 mg/dL — ABNORMAL HIGH (ref 0.44–1.00)
GFR calc Af Amer: >60 mL/min (ref >60)
GFR calc non Af Amer: 54 mL/min — ABNORMAL LOW (ref >60)
Glucose, Bld: 223 mg/dL — ABNORMAL HIGH (ref 70–99)
Potassium: 3.6 mmol/L (ref 3.5–5.1)
Sodium: 136 mmol/L (ref 135–145)

## 2019-10-09 LAB — CBC WITH DIFFERENTIAL/PLATELET
Abs Immature Granulocytes: 0.08 10*3/uL — ABNORMAL HIGH (ref 0.00–0.07)
Basophils Absolute: 0 10*3/uL (ref 0.0–0.1)
Basophils Relative: 0 %
Eosinophils Absolute: 0.1 10*3/uL (ref 0.0–0.5)
Eosinophils Relative: 1 %
HCT: 38.4 % (ref 36.0–46.0)
Hemoglobin: 12.8 g/dL (ref 12.0–15.0)
Immature Granulocytes: 1 %
Lymphocytes Relative: 14 %
Lymphs Abs: 1.2 10*3/uL (ref 0.7–4.0)
MCH: 29.4 pg (ref 26.0–34.0)
MCHC: 33.3 g/dL (ref 30.0–36.0)
MCV: 88.1 fL (ref 80.0–100.0)
Monocytes Absolute: 0.8 10*3/uL (ref 0.1–1.0)
Monocytes Relative: 10 %
Neutro Abs: 6.5 10*3/uL (ref 1.7–7.7)
Neutrophils Relative %: 74 %
Platelets: 463 10*3/uL — ABNORMAL HIGH (ref 150–400)
RBC: 4.36 MIL/uL (ref 3.87–5.11)
RDW: 12.9 % (ref 11.5–15.5)
WBC: 8.7 10*3/uL (ref 4.0–10.5)
nRBC: 0 % (ref 0.0–0.2)

## 2019-10-09 LAB — CBG MONITORING, ED: Glucose-Capillary: 213 mg/dL — ABNORMAL HIGH (ref 70–99)

## 2019-10-09 LAB — LACTIC ACID, PLASMA: Lactic Acid, Venous: 1.7 mmol/L (ref 0.5–1.9)

## 2019-10-09 LAB — SARS CORONAVIRUS 2 BY RT PCR (HOSPITAL ORDER, PERFORMED IN ~~LOC~~ HOSPITAL LAB): SARS Coronavirus 2: NEGATIVE

## 2019-10-09 MED ORDER — ALBUTEROL SULFATE (2.5 MG/3ML) 0.083% IN NEBU: 2.5000 mg | INHALATION_SOLUTION | Freq: Four times a day (QID) | RESPIRATORY_TRACT | Status: DC | PRN

## 2019-10-09 MED ORDER — ALBUTEROL SULFATE HFA 108 (90 BASE) MCG/ACT IN AERS: 2.0000 | INHALATION_SPRAY | RESPIRATORY_TRACT | Status: DC | PRN

## 2019-10-09 MED ORDER — HYDROCHLOROTHIAZIDE 25 MG PO TABS: 25.0000 mg | ORAL_TABLET | Freq: Every day | ORAL | Status: DC

## 2019-10-09 MED ORDER — LISINOPRIL-HYDROCHLOROTHIAZIDE 20-12.5 MG PO TABS: 2.0000 | ORAL_TABLET | Freq: Every day | ORAL | Status: DC

## 2019-10-09 MED ORDER — MORPHINE SULFATE (PF) 4 MG/ML IV SOLN
4.0000 mg | Freq: Once | INTRAVENOUS | Status: AC
  Administered 2019-10-09: 4 mg via INTRAVENOUS
  Filled 2019-10-09: qty 1

## 2019-10-09 MED ORDER — PROMETHAZINE HCL 25 MG/ML IJ SOLN
25.0000 mg | Freq: Once | INTRAMUSCULAR | Status: AC
  Administered 2019-10-09: 25 mg via INTRAVENOUS

## 2019-10-09 MED ORDER — INSULIN ASPART 100 UNIT/ML ~~LOC~~ SOLN
0.0000 [IU] | Freq: Three times a day (TID) | SUBCUTANEOUS | Status: DC
  Administered 2019-10-10: 3 [IU] via SUBCUTANEOUS
  Administered 2019-10-10: 5 [IU] via SUBCUTANEOUS
  Administered 2019-10-10 – 2019-10-12 (×7): 3 [IU] via SUBCUTANEOUS
  Administered 2019-10-13: 5 [IU] via SUBCUTANEOUS
  Administered 2019-10-13: 2 [IU] via SUBCUTANEOUS
  Administered 2019-10-13: 5 [IU] via SUBCUTANEOUS
  Administered 2019-10-14: 2 [IU] via SUBCUTANEOUS
  Administered 2019-10-14: 3 [IU] via SUBCUTANEOUS

## 2019-10-09 MED ORDER — LISINOPRIL 20 MG PO TABS: 40.0000 mg | ORAL_TABLET | Freq: Every day | ORAL | Status: DC

## 2019-10-09 MED ORDER — POTASSIUM CHLORIDE CRYS ER 20 MEQ PO TBCR
20.0000 meq | EXTENDED_RELEASE_TABLET | Freq: Every day | ORAL | Status: DC
  Administered 2019-10-10: 20 meq via ORAL
  Filled 2019-10-09: qty 1

## 2019-10-09 MED ORDER — ONDANSETRON HCL 4 MG PO TABS
4.0000 mg | ORAL_TABLET | Freq: Four times a day (QID) | ORAL | Status: DC | PRN
  Administered 2019-10-10: 4 mg via ORAL
  Filled 2019-10-09: qty 1

## 2019-10-09 MED ORDER — ATENOLOL 25 MG PO TABS
100.0000 mg | ORAL_TABLET | Freq: Every day | ORAL | Status: DC
  Administered 2019-10-10: 100 mg via ORAL
  Filled 2019-10-09: qty 4

## 2019-10-09 MED ORDER — AMLODIPINE BESYLATE 5 MG PO TABS
5.0000 mg | ORAL_TABLET | Freq: Every day | ORAL | Status: DC
  Administered 2019-10-10: 5 mg via ORAL
  Filled 2019-10-09: qty 1

## 2019-10-09 MED ORDER — ONDANSETRON HCL 4 MG/2ML IJ SOLN
4.0000 mg | Freq: Four times a day (QID) | INTRAMUSCULAR | Status: DC | PRN
  Administered 2019-10-13: 4 mg via INTRAVENOUS

## 2019-10-09 MED ORDER — ONDANSETRON HCL 4 MG/2ML IJ SOLN
4.0000 mg | Freq: Once | INTRAMUSCULAR | Status: AC
  Administered 2019-10-09: 4 mg via INTRAVENOUS
  Filled 2019-10-09: qty 2

## 2019-10-09 MED ORDER — MOMETASONE FURO-FORMOTEROL FUM 200-5 MCG/ACT IN AERO
2.0000 | INHALATION_SPRAY | Freq: Two times a day (BID) | RESPIRATORY_TRACT | Status: DC
  Administered 2019-10-10 – 2019-10-14 (×9): 2 via RESPIRATORY_TRACT
  Filled 2019-10-09: qty 8.8

## 2019-10-09 MED ORDER — PROMETHAZINE HCL 25 MG/ML IJ SOLN
INTRAMUSCULAR | Status: AC
  Filled 2019-10-09: qty 1

## 2019-10-09 MED ORDER — ANASTROZOLE 1 MG PO TABS
1.0000 mg | ORAL_TABLET | Freq: Every day | ORAL | Status: DC
  Administered 2019-10-10 – 2019-10-12 (×3): 1 mg via ORAL
  Filled 2019-10-09 (×3): qty 1

## 2019-10-09 NOTE — H&P (Signed)
History and Physical    Autumn Cook LZJ:[id number] DOB: Mar 13, 1961 DOA: 10/09/2019  PCP: Darreld Mclean, MD  Patient coming from: Home.  Chief Complaint: Shortness of breath.  HPI: Autumn Cook is a 59 y.o. female with history of diabetes mellitus type 2, hypertension, breast cancer has been experiencing increasing shortness of breath over the last 2 to 3 weeks and had thoracentesis done on Oct 03, 2019 about a week ago has started experiencing shortness of breath again and came to the ER at [facility].  Has been examined productive cough and pleuritic type of chest pain mostly on the left side.  ED Course: In the ER x-ray shows recurrence of the left-sided pleural effusion.  Covid test was negative patient was mildly hypoxic but not febrile.  CBC largely unremarkable lactic acid was normal.  Creatinine is increased from baseline and is at around 1.1 now.  Blood glucose was 223.  EKG was showing sinus tachycardia.  Patient is being admitted for respiratory failure secondary to recurrent pleural effusion.  Review of Systems: As per HPI, rest all negative.   Past Medical History:  Diagnosis Date  . Allergy   . Arthritis   . Asthma   . Cancer [facility])    R breast cancer  . Diabetes mellitus without complication (HCC)    no meds now  . Family history of adverse reaction to anesthesia    "entire family takes a long time to wake up- N&V for several days"  . Family history of lung cancer   . Hypertension   . Migraine   . PONV (postoperative nausea and vomiting)     Past Surgical History:  Procedure Laterality Date  . BREAST CYST EXCISION  1994  . BREAST LUMPECTOMY WITH RADIOACTIVE SEED AND SENTINEL LYMPH NODE BIOPSY Right 12/27/2018   Procedure: RIGHT BREAST LUMPECTOMY WITH RADIOACTIVE SEED X2 AND RIGHT SEED TARGETED AXILLARY SENTINEL LYMPH NODE BIOPSY;  Surgeon: Alphonsa Overall, MD;  Location: [facility];  Service: General;  Laterality: Right;  .  DILATION AND CURETTAGE OF UTERUS    . PORTACATH PLACEMENT Right 08/01/2018   Procedure: INSERTION PORT-A-CATH WITH ULTRASOUND;  Surgeon: Alphonsa Overall, MD;  Location: [facility];  Service: General;  Laterality: Right;     reports that she quit smoking about 31 years ago. She has a 5.00 pack-year smoking history. She has never used smokeless tobacco. She reports current alcohol use. She reports that she does not use drugs.  Allergies  Allergen Reactions  . Penicillins Hives    Did it involve swelling of the face/tongue/throat, SOB, or low BP? Yes--syncope episode Did it involve sudden or severe rash/hives, skin peeling, or any reaction on the inside of your mouth or nose? No Did you need to seek medical attention at a hospital or doctor's office? Yes When did it last happen? 37-38 YEARS AGO If all above answers are "NO", may proceed with cephalosporin use.   . Metformin And Related Diarrhea  . Adhesive [Tape] Other (See Comments)    "severe bruising"  ALSO SKIN GLUE  . Asa [Aspirin] Other (See Comments)    GI upset     Family History  Problem Relation Age of Onset  . Hypertension Father   . Cancer Father 29       lung cancer  . Stroke Father   . Heart disease Mother        arrhythmia  . Hypertension Mother   . Kidney  failure Sister     Prior to Admission medications   Medication Sig Start Date End Date Taking? Authorizing Provider  amLODipine (NORVASC) 5 MG tablet Take 1 tablet (5 mg total) by mouth daily. Start with 2.5 mg Patient taking differently: Take 5 mg by mouth daily. Start with 2.5 mg 12/02/18  Yes Copland, Gay Filler, MD  anastrozole (ARIMIDEX) 1 MG tablet Take 1 tablet (1 mg total) by mouth daily. 09/30/19  Yes Magrinat, Virgie Dad, MD  atenolol (TENORMIN) 100 MG tablet TAKE 1 TABLET BY MOUTH EVERY DAY 06/19/19  Yes Copland, Gay Filler, MD  cholecalciferol (VITAMIN D3) 25 MCG (1000 UNIT) tablet Take 1 tablet (1,000 Units total) by mouth daily.  09/30/19  Yes Magrinat, Virgie Dad, MD  fluticasone-salmeterol (ADVAIR HFA) [phone] MCG/ACT inhaler Inhale 2 puffs into the lungs 2 (two) times daily. 10/02/19  Yes Tanner, Lyndon Code., PA-C  glipiZIDE (GLUCOTROL) 5 MG tablet Start with 5 mg once a day, then increase to twice a day after 1 week.  Continue to increase as directed by MD 04/16/19  Yes Copland, Gay Filler, MD  guaiFENesin-codeine 100-10 MG/5ML syrup Take 5 mLs by mouth every 4 (four) hours as needed for cough. 10/02/19  Yes Tanner, Lyndon Code., PA-C  levofloxacin (LEVAQUIN) 500 MG tablet Take 1 tablet (500 mg total) by mouth daily. 10/02/19  Yes Tanner, Lyndon Code., PA-C  lisinopril-hydrochlorothiazide (ZESTORETIC) 20-12.5 MG tablet Take 2 tablets by mouth daily.   Yes [provider]  Potassium Chloride ER 20 MEQ TBCR Take 20 mEq by mouth daily. 10/02/19  Yes Tanner, Lyndon Code., PA-C  albuterol (VENTOLIN HFA) 108 (90 Base) MCG/ACT inhaler Inhale 2 puffs into the lungs every 4 (four) hours as needed for wheezing or shortness of breath (cough, shortness of breath or wheezing.). 10/15/18   Gardenia Phlegm, NP  benzonatate (TESSALON) 100 MG capsule Take 2 capsules (200 mg total) by mouth 3 (three) times daily as needed for cough. 10/02/19   Tanner, Lyndon Code., PA-C  blood glucose meter kit and supplies KIT Dispense based on patient and insurance preference. Use up to four times daily as directed. (FOR ICD-9 250.00, 250.01). 03/07/16   Charlesetta Shanks, MD  glucose blood (ACCU-CHEK ACTIVE STRIPS) test strip Use as instructed- check blood sugar up to 2x a day 08/30/18   Copland, Gay Filler, MD  Vitamin D, Ergocalciferol, (DRISDOL) 1.25 MG (50000 UNIT) CAPS capsule TAKE 1 CAPSULE BY MOUTH ONE TIME PER WEEK 09/30/19   Copland, Gay Filler, MD    Physical Exam: Constitutional: Moderately built and nourished. Vitals:   10/09/19 2030 10/09/19 2100 10/09/19 2130 10/09/19 2230  BP: 104/72 101/82 98/75 103/78  Pulse: 89 90 94 92  Resp: 20 19 (!) 21 16  Temp:    98.5 F  (36.9 C)  TempSrc:      SpO2: 95% 96% 93% 98%  Weight:      Height:       Eyes: Anicteric no pallor. ENMT: No discharge from the ears eyes nose or mouth. Neck: No mass felt.  No neck rigidity. Respiratory: No rhonchi or crepitations. Cardiovascular: S1-S2 heard. Abdomen: Soft nontender bowel sounds present. Musculoskeletal: No edema. Skin: No rash. Neurologic: Alert awake oriented to time place and person.  Moves all extremities. Psychiatric: Appears normal per normal affect.   Labs on Admission: I have personally reviewed following labs and imaging studies  CBC: Recent Labs  Lab 10/09/19 1554  WBC 8.7  NEUTROABS 6.5  HGB 12.8  HCT 38.4  MCV 88.1  PLT 735*   Basic Metabolic Panel: Recent Labs  Lab 10/09/19 1554  NA 136  K 3.6  CL 97*  CO2 26  GLUCOSE 223*  BUN 23*  CREATININE 1.12*  CALCIUM 9.5   GFR: Estimated Creatinine Clearance: 59.2 mL/min (A) (by C-G formula based on SCr of 1.12 mg/dL (H)). Liver Function Tests: No results for input(s): AST, ALT, ALKPHOS, BILITOT, PROT, ALBUMIN in the last 168 hours. No results for input(s): LIPASE, AMYLASE in the last 168 hours. No results for input(s): AMMONIA in the last 168 hours. Coagulation Profile: No results for input(s): INR, PROTIME in the last 168 hours. Cardiac Enzymes: No results for input(s): CKTOTAL, CKMB, CKMBINDEX, TROPONINI in the last 168 hours. BNP (last 3 results) No results for input(s): PROBNP in the last 8760 hours. HbA1C: No results for input(s): HGBA1C in the last 72 hours. CBG: Recent Labs  Lab 10/09/19 2136  GLUCAP 213*   Lipid Profile: No results for input(s): CHOL, HDL, LDLCALC, TRIG, CHOLHDL, LDLDIRECT in the last 72 hours. Thyroid Function Tests: No results for input(s): TSH, T4TOTAL, FREET4, T3FREE, THYROIDAB in the last 72 hours. Anemia Panel: No results for input(s): VITAMINB12, FOLATE, FERRITIN, TIBC, IRON, RETICCTPCT in the last 72 hours. Urine analysis:    Component  Value Date/Time   COLORURINE YELLOW 03/07/2016 1350   APPEARANCEUR CLEAR 03/07/2016 1350   LABSPEC 1.031 (H) 03/07/2016 1350   PHURINE 5.0 03/07/2016 1350   GLUCOSEU >1000 (A) 03/07/2016 1350   HGBUR NEGATIVE 03/07/2016 1350   BILIRUBINUR NEGATIVE 03/07/2016 1350   BILIRUBINUR neg 11/24/2013 1547   KETONESUR 15 (A) 03/07/2016 1350   PROTEINUR NEGATIVE 03/07/2016 1350   UROBILINOGEN 0.2 11/24/2013 1547   UROBILINOGEN 0.2 11/10/2006 1153   NITRITE NEGATIVE 03/07/2016 1350   LEUKOCYTESUR NEGATIVE 03/07/2016 1350   Sepsis Labs: @LABRCNTIP (procalcitonin:4,lacticidven:4) ) Recent Results (from the past 240 hour(s))  SARS CORONAVIRUS 2 (TAT 6-24 HRS) Nasopharyngeal Nasopharyngeal Swab     Status: None   Collection Time: 10/01/19 10:51 AM   Specimen: Nasopharyngeal Swab  Result Value Ref Range Status   SARS Coronavirus 2 NEGATIVE NEGATIVE Final    Comment: (NOTE) SARS-CoV-2 target nucleic acids are NOT DETECTED. The SARS-CoV-2 RNA is generally detectable in upper and lower respiratory specimens during the acute phase of infection. Negative results do not preclude SARS-CoV-2 infection, do not rule out co-infections with other pathogens, and should not be used as the sole basis for treatment or other patient management decisions. Negative results must be combined with clinical observations, patient history, and epidemiological information. The expected result is Negative. Fact Sheet for Patients: SugarRoll.be Fact Sheet for Healthcare Providers: https://www.woods-mathews.com/ This test is not yet approved or cleared by the [place] FDA and  has been authorized for detection and/or diagnosis of SARS-CoV-2 by FDA under an Emergency Use Authorization (EUA). This EUA will remain  in effect (meaning this test can be used) for the duration of the COVID-19 declaration under Section 56 4(b)(1) of the Act, 21 U.S.C. section 360bbb-3(b)(1), unless  the authorization is terminated or revoked sooner. Performed at [facility], [place] [address] [place], [place] [address]   Body fluid culture     Status: None   Collection Time: 10/03/19 12:10 PM   Specimen: PATH Cytology Pleural fluid  Result Value Ref Range Status   Specimen Description   Final    CYTO PLEU Performed at [place] [address], [place],  [address]    Special  Requests   Final    NONE Performed at [facility], [place] [address], [place], [place] [address]    Gram Stain NO WBC SEEN NO ORGANISMS SEEN   Final   Culture   Final    NO GROWTH 3 DAYS Performed at [facility], [place] [address], [place],  [address]    Report Status 10/06/2019 FINAL  Final  SARS Coronavirus 2 by RT PCR (hospital order, performed in [facility] hospital lab) Nasopharyngeal Nasopharyngeal Swab     Status: None   Collection Time: 10/09/19  5:29 PM   Specimen: Nasopharyngeal Swab  Result Value Ref Range Status   SARS Coronavirus 2 NEGATIVE NEGATIVE Final    Comment: (NOTE) SARS-CoV-2 target nucleic acids are NOT DETECTED. The SARS-CoV-2 RNA is generally detectable in upper and lower respiratory specimens during the acute phase of infection. The lowest concentration of SARS-CoV-2 viral copies this assay can detect is 250 copies / mL. A negative result does not preclude SARS-CoV-2 infection and should not be used as the sole basis for treatment or other patient management decisions.  A negative result may occur with improper specimen collection / handling, submission of specimen other than nasopharyngeal swab, presence of viral mutation(s) within the areas targeted by this assay, and inadequate number of viral copies (<250 copies / mL). A negative result must be combined with clinical observations, patient history, and epidemiological information. Fact Sheet for Patients:   StrictlyIdeas.no Fact  Sheet for Healthcare Providers: BankingDealers.co.za This test is not yet approved or cleared  by the [place] FDA and has been authorized for detection and/or diagnosis of SARS-CoV-2 by FDA under an Emergency Use Authorization (EUA).  This EUA will remain in effect (meaning this test can be used) for the duration of the COVID-19 declaration under Section 564(b)(1) of the Act, 21 U.S.C. section 360bbb-3(b)(1), unless the authorization is terminated or revoked sooner. Performed at [facility], [address] [place], [place] [address]      Radiological Exams on Admission: DG Chest [place] 1 View  Result Date: 10/09/2019 CLINICAL DATA:  Shortness of breath, left-sided chest pain EXAM: PORTABLE CHEST 1 VIEW COMPARISON:  10/03/2019 FINDINGS: Single frontal view of the chest demonstrates complete opacification of the left hemithorax consistent with enlarging left pleural effusion and underlying left lung consolidation. Right chest is clear. No evidence of pneumothorax. No acute bony abnormalities. IMPRESSION: 1. Recurrent large left pleural effusion with complete opacification of the left hemithorax. Electronically Signed   By: Randa Ngo M.D.   On: 10/09/2019 16:57    EKG: Independently reviewed.  Sinus tachycardia.  Assessment/Plan Principal Problem:   Pleural effusion Active Problems:   Hypertension   Malignant neoplasm of overlapping sites of right breast in female, estrogen receptor positive ([place])   Type 2 diabetes mellitus with hyperglycemia ([place])    1. Acute respiratory failure with hypoxia secondary to recurrent pleural effusion with history of breast cancer -will order for IR guided thoracentesis in the morning.  Presently not in distress.  Will order cell count differential Gram stain and cytology. 2. Diabetes mellitus type 2 we will keep patient on sliding scale coverage. 3. Hypertension we will continue amlodipine atenolol hold of  lisinopril and hydrochlorothiazide due to acute renal failure.  As needed IV hydralazine. 4. Acute renal failure will hold of lisinopril hydrochlorothiazide for now.  Closely follow metabolic panel. 5. History of breast cancer with recurrent pleural effusion will need to check cytology of  the fluid and also get input from patient's oncologist [name].  .  Acute respiratory failure with recurrent pleural effusion patient will need close monitoring and 2 midnight stay in inpatient status.   DVT prophylaxis: In anticipation of thoracentesis holding off pharmacological DVT prophylaxis.  SCDs. Code Status: Full code. Family Communication: Discussed with patient. Disposition Plan: Home. Consults called: Oncology team listed. Admission status: Inpatient.   Rise Patience MD [facility] Pager [phone].  If 7PM-7AM, please contact night-coverage [email or web address] Password [facility]  10/09/2019, 10:46 PM

## 2019-10-09 NOTE — Progress Notes (Signed)
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  Please see my hospital note today for more details.  I last saw her on 09/30/2019 for left flank pain and more frequent asthma attacks. She proceeded to renal CT that day showing: no acute findings within abdomen or pelvis; left pleural effusion; two subtle hypoattenuating lesions in liver.  She underwent diagnostic and therapeutic thoracentesis on 10/03/2019. The procedure yielded 950 mL of pleural fluid. Cytology from the fluid ([id number]) confirmed the presence of malignant cells.  She also underwent liver MRI on 10/04/2019 to further evaluate the lesions seen on recent CT. This showed: evidence of multifocal hepatic metastasis involving right and central left hepatic lobes with 6-8 hypoenhancing lesions; large left effusion and a passive atelectasis left lower lobe; potential pulmonary nodule versus atelectasis in lingula.  She presented to the ED yesterday, 10/09/2019 with worsening shortness of breath and 3 days of vomiting "white foamy stuff."  She continues on anastrozole.  She is tolerating this well, with no significant hot flashes or vaginal dryness problems.  She is obtaining it at a good  price  Her most recent bone density screening showed a T-score of -0.8, which is considered normal.    REVIEW OF SYSTEMS Autumn Cook    HISTORY OF CURRENT ILLNESS: From the original intake note:  Autumn Cook had routine screening mammography on 07/01/2018 showing a possible abnormality in the right breast; she notes that did feel some pain and a lump in her breast leading up to the routine screening. She underwent right breast ultrasonography at [facility] on 07/05/2018 showing: A 1.4 x 1.2 x 1.4 cm oval hypoechoic mass at the 5:30 position 8 cm from the nipple. A 0.9 x 1 x 1 cm slightly irregular hypoechoic mass at the 10:30 position 9 cm from the nipple. A 3 x 1.9 x 3 cm probable lymph node without fatty hilum in the LOWER RIGHT axilla. A RIGHT axillary lymph node with slightly thickened cortex.  Accordingly on 07/10/2018 she proceeded to biopsy of the right breast area in question. The pathology from this procedure showed ([id number]): invasive ductal carcinoma, high grade, 5:30 o'clock. Prognostic indicators significant for: estrogen receptor, 30% positive with weak staining intensity and progesterone receptor, 0% negative. Proliferation marker Ki67 at 70%.   An additional biopsy was performed on the same day ([id number]) showing:  2. Breast, right, needle core biopsy, 10 o'clock - Invasive carcinoma  Finally, an additional biopsy was performed at the right axilla on the same day ([id number]) showing: invasive carcinoma, right axilla. Prognostic indicators significant for: estrogen receptor, 70% positive, with weak staining intensity and progesterone receptor, 0% negative. Proliferation marker Ki67 at 70%. HER2 positive by immunohistochemistry.    The patient's subsequent history is as detailed below.   PAST MEDICAL HISTORY: Past Medical History:  Diagnosis Date  . Allergy   .  Arthritis   . Asthma   . Cancer [facility])    R breast cancer  . Diabetes mellitus without complication  (HCC)    no meds now  . Family history of adverse reaction to anesthesia    "entire family takes a long time to wake up- N&V for several days"  . Family history of lung cancer   . Hypertension   . Migraine   . PONV (postoperative nausea and vomiting)     PAST SURGICAL HISTORY: Past Surgical History:  Procedure Laterality Date  . BREAST CYST EXCISION  1994  . BREAST LUMPECTOMY WITH RADIOACTIVE SEED AND SENTINEL LYMPH NODE BIOPSY Right 12/27/2018   Procedure: RIGHT BREAST LUMPECTOMY WITH RADIOACTIVE SEED X2 AND RIGHT SEED TARGETED AXILLARY SENTINEL LYMPH NODE BIOPSY;  Surgeon: Alphonsa Overall, MD;  Location: [place];  Service: General;  Laterality: Right;  . DILATION AND CURETTAGE OF UTERUS    . PORTACATH PLACEMENT Right 08/01/2018   Procedure: INSERTION PORT-A-CATH WITH ULTRASOUND;  Surgeon: Alphonsa Overall, MD;  Location: [place];  Service: General;  Laterality: Right;    FAMILY HISTORY: Family History  Problem Relation Age of Onset  . Hypertension Father   . Cancer Father 98       lung cancer  . Stroke Father   . Heart disease Mother        arrhythmia  . Hypertension Mother   . Kidney failure Sister    [name]'s father died from lung cancer at age 58; he was a heavy smoker. Patients' mother is 34 as of 07/2018. The patient has 3 brothers and 2 sisters. Patient denies anyone in her family having breast, ovarian, prostate, or pancreatic cancer. Autumn Cook's maternal great great uncle was diagnosed with lung cancer.     GYNECOLOGIC HISTORY:  Patient's last menstrual period was 11/14/2013. 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 Her husband, Autumn Cook, is a Administrator.  The patient separated from her husband in  January 2021 and they are planning on an eventual divorce.  Autumn Cook has 4 children, Autumn Cook, Autumn Cook, and Autumn Cook. Autumn Cook is 93, lives in [place], and works in Science writer. [name] is 32, lives in [place], and works in Engineer, technical sales. Autumn Cook is 97, is taking care of Ta[place]anna, and works as a Control and instrumentation engineer. Autumn Cook is 24, lives in [place], and is a Buyer, retail.    ADVANCED DIRECTIVES: At the 06/23/2019 visit the patient was given the appropriate documents to complete and notarized at her discretion.  She is planning to name her daughter Autumn Cook as her healthcare power of attorney.  She can be reached at [phone].   HEALTH MAINTENANCE: Social History   Tobacco Use  . Smoking status: Former Smoker    Packs/day: 1.00    Years: 5.00    Pack years: 5.00    Quit date: 05/08/1988    Years since quitting: 31.4  . Smokeless tobacco: Never Used  Substance Use Topics  . Alcohol use: Yes    Comment: social  . Drug use: No    Colonoscopy: yes, 2010 - due 2020.  PAP: 2017  Bone density: yes, 2015, normal Mammography: 07/01/2018   Allergies  Allergen Reactions  . Penicillins Hives    Did it  involve swelling of the face/tongue/throat, SOB, or low BP? Yes--syncope episode Did it involve sudden or severe rash/hives, skin peeling, or any reaction on the inside of your mouth or nose? No Did you need to seek medical attention at a hospital or doctor's office? Yes When did it last happen? 37-38 YEARS AGO If all above answers are "NO", may proceed with cephalosporin use.   . Metformin And Related Diarrhea  . Adhesive [Tape] Other (See Comments)    "severe bruising"  ALSO SKIN GLUE  . Asa [Aspirin] Other (See Comments)    GI upset   . Mango Flavor Hives  . Olive Tree Hives    Pt tolerates olive oil    No current facility-administered medications for this visit.   No current outpatient medications on file.   Facility-Administered Medications Ordered in Other Visits   Medication Dose Route Frequency Provider Last Rate Last Admin  . albuterol (PROVENTIL) (2.5 MG/3ML) 0.083% nebulizer solution 2.5 mg  2.5 mg Nebulization Q6H PRN Rise Patience, MD      . amLODipine (NORVASC) tablet 5 mg  5 mg Oral Daily Rise Patience, MD   5 mg at 10/10/19 0929  . anastrozole (ARIMIDEX) tablet 1 mg  1 mg Oral Daily Rise Patience, MD   1 mg at 10/10/19 6440  . atenolol (TENORMIN) tablet 100 mg  100 mg Oral Daily Rise Patience, MD   100 mg at 10/10/19 3474  . hydrALAZINE (APRESOLINE) injection 10 mg  10 mg Intravenous Q4H PRN Rise Patience, MD      . insulin aspart (novoLOG) injection 0-9 Units  0-9 Units Subcutaneous TID WC Rise Patience, MD   5 Units at 10/10/19 [phone]  . mometasone-formoterol (DULERA) 200-5 MCG/ACT inhaler 2 puff  2 puff Inhalation BID Rise Patience, MD   2 puff at 10/10/19 [phone]  . morphine 2 MG/ML injection 1 mg  1 mg Intravenous Q2H PRN Arrien, Jimmy Picket, MD   1 mg at 10/10/19 0921  . ondansetron (ZOFRAN) tablet 4 mg  4 mg Oral Q6H PRN Rise Patience, MD   4 mg at 10/10/19 5643   Or  . ondansetron (ZOFRAN) injection 4 mg  4 mg Intravenous Q6H PRN Rise Patience, MD      . oxyCODONE (Oxy IR/ROXICODONE) immediate release tablet 5 mg  5 mg Oral Q4H PRN Arrien, Jimmy Picket, MD      . potassium chloride SA (KLOR-CON) CR tablet 20 mEq  20 mEq Oral Daily Rise Patience, MD   20 mEq at 10/10/19 3295     OBJECTIVE:  African-American woman  There were no vitals filed for this visit.   There is no height or weight on file to calculate BMI.   Wt Readings from Last 3 Encounters:  10/09/19 177 lb 4.8 oz (80.4 kg)  10/02/19 180 lb 3.2 oz (81.7 kg)  09/30/19 181 lb 14.4 oz (82.5 kg)    ECOG FS:1 - Symptomatic but completely ambulatory     LAB RESULTS:  CMP     Component Value Date/Time   NA 137 10/10/2019 0512   K 4.3 10/10/2019 0512   CL 99 10/10/2019 0512   CO2 25 10/10/2019 0512    GLUCOSE 287 (H) 10/10/2019 0512   BUN 33 (H) 10/10/2019 0512   CREATININE 1.35 (H) 10/10/2019 0512   CREATININE 0.89 10/02/2019 1416   CREATININE 0.70 04/14/2015 1558   CALCIUM 9.3 10/10/2019 0512   PROT 7.4 10/02/2019 1416  ALBUMIN 3.9 10/02/2019 1416   AST 15 10/02/2019 1416   ALT 10 10/02/2019 1416   ALKPHOS 52 10/02/2019 1416   BILITOT 0.2 (L) 10/02/2019 1416   GFRNONAA 43 (L) 10/10/2019 0512   GFRNONAA >60 10/02/2019 1416   GFRNONAA 87 03/27/2014 1130   GFRAA 50 (L) 10/10/2019 0512   GFRAA >60 10/02/2019 1416   GFRAA >89 03/27/2014 1130    No results found for: TOTALPROTELP, ALBUMINELP, A1GS, A2GS, BETS, BETA2SER, GAMS, MSPIKE, SPEI  No results found for: KPAFRELGTCHN, LAMBDASER, KAPLAMBRATIO  Lab Results  Component Value Date   WBC 9.8 10/10/2019   NEUTROABS 6.5 10/09/2019   HGB 11.8 (L) 10/10/2019   HCT 35.5 (L) 10/10/2019   MCV 89.0 10/10/2019   PLT 396 10/10/2019   No results found for: LABCA2  No components found for: OYDXAJ287  No results for input(s): INR in the last 168 hours.  No results found for: LABCA2  No results found for: OMV672  No results found for: CNO709  No results found for: GGE366  No results found for: CA2729  No components found for: HGQUANT  No results found for: CEA1 / No results found for: CEA1   No results found for: AFPTUMOR  No results found for: CHROMOGRNA  No results found for: HGBA, HGBA2QUANT, HGBFQUANT, HGBSQUAN (Hemoglobinopathy evaluation)   No results found for: LDH  No results found for: IRON, TIBC, IRONPCTSAT (Iron and TIBC)  No results found for: FERRITIN  Urinalysis    Component Value Date/Time   COLORURINE YELLOW 03/07/2016 1350   APPEARANCEUR CLEAR 03/07/2016 1350   LABSPEC 1.031 (H) 03/07/2016 1350   PHURINE 5.0 03/07/2016 1350   GLUCOSEU >1000 (A) 03/07/2016 1350   HGBUR NEGATIVE 03/07/2016 1350   BILIRUBINUR NEGATIVE 03/07/2016 1350   BILIRUBINUR neg 11/24/2013 1547   KETONESUR 15 (A)  03/07/2016 1350   PROTEINUR NEGATIVE 03/07/2016 1350   UROBILINOGEN 0.2 11/24/2013 1547   UROBILINOGEN 0.2 11/10/2006 1153   NITRITE NEGATIVE 03/07/2016 1350   LEUKOCYTESUR NEGATIVE 03/07/2016 1350     STUDIES:  DG Chest 1 View  Result Date: 10/03/2019 CLINICAL DATA:  Status post left thoracentesis. EXAM: CHEST  1 VIEW COMPARISON:  11/18/2018. FINDINGS: No PowerPort catheter noted on today's exam. Heart size normal. Left lower lobe atelectasis/infiltrate. Moderate left-sided pleural effusion. No pneumothorax post thoracentesis. IMPRESSION: No pneumothorax post thoracentesis. Electronically Signed   By: Marcello Moores  Register   On: 10/03/2019 12:11   MR LIVER W WO CONTRAST  Result Date: 10/04/2019 CLINICAL DATA:  Breast cancer. Indeterminate hepatic lesions on noncontrast CT. MRI recommended for further evaluation. EXAM: MRI ABDOMEN WITHOUT AND WITH CONTRAST TECHNIQUE: Multiplanar multisequence MR imaging of the abdomen was performed both before and after the administration of intravenous contrast. CONTRAST:  60m GADAVIST GADOBUTROL 1 MMOL/ML IV SOLN COMPARISON:  CT Sep 30, 2019 FINDINGS: Lower chest: Large LEFT effusion with atelectasis of the LEFT lower lobe. Potential lingular nodule measuring 1.1 cm (image 30 3/28). Hepatobiliary: Multiple round lesions in the liver which are intermediate hyperintensity on T2 weighted imaging. Example lesion includes 2.8 cm lesion central RIGHT hepatic lobe (image 9/4). 1.6 cm lesion in the dome the RIGHT hepatic lobe (image 7/4). Lesion in the central LEFT hepatic lobe adjacent falciform ligament measuring 2.0 cm image 22/4. In total, there approximately 6-8 lesions in the central LEFT hepatic lobe and RIGHT hepatic lobe. These lesions are hypoenhancing on T1 weighted imaging postcontrast (series 19 and consistent with hepatic metastasis. There is a large typical benign hepatic cyst  centrally in the liver measuring 3.4 cm (image 42/19). Pancreas: Normal pancreatic  parenchymal intensity. No ductal dilatation or inflammation. Spleen: Normal spleen. Adrenals/urinary tract: Adrenal glands and kidneys are normal. Stomach/Bowel: Stomach and limited of the small bowel is unremarkable Vascular/Lymphatic: Abdominal aortic normal caliber. No retroperitoneal periportal lymphadenopathy. Musculoskeletal: The postsurgical change in the deep RIGHT breast. IMPRESSION: 1. Unfortunately, evidence of multifocal hepatic metastasis involving the RIGHT hepatic lobe and central LEFT hepatic lobe with 6 to 8 hypoenhancing lesions. 2. Large LEFT effusion and a passive atelectasis LEFT lower lobe. 3. Potential pulmonary nodule versus atelectasis in the lingula. These results will be called to the ordering clinician or representative by the Radiologist Assistant, and communication documented in the PACS or [employer]. Electronically Signed   By: Suzy Bouchard M.D.   On: 10/04/2019 13:58   DG Bone Density  Result Date: 09/22/2019 EXAM: DUAL X-RAY ABSORPTIOMETRY (DXA) FOR BONE MINERAL DENSITY IMPRESSION: Referring Physician:  Chauncey Cruel Your patient completed a BMD test using Lunar IDXA DXA system ( analysis version: 16 ) manufactured by [employer]. Technologist: WLS PATIENT: Name: Aniqua, Briere Patient ID: [id number] Birth Date: [birth year] Height: 66.5 in. Sex: Female Measured: 09/22/2019 Weight: 181.5 lbs. Indications: Arimidex, Breast Cancer History, Estrogen Deficient, Low Calcium Intake (269.3), Postmenopausal Fractures: None Treatments: Vitamin D (E933.5) ASSESSMENT: The BMD measured at Femur Neck Right is 0.920 g/cm2 with a T-score of -0.8. This patient is considered normal according to [facility]) criteria. The scan quality is good. Site Region Measured Date Measured Age YA BMD Significant CHANGE T-score DualFemur Neck Right 09/22/2019    57.2         -0.8    0.920 g/cm2 AP Spine  L1-L4      09/22/2019    57.2         0.0     1.175 g/cm2 DualFemur  Total Mean 09/22/2019    57.2         -0.4    0.957 g/cm2 World Health Organization [facility]) criteria for post-menopausal, Caucasian Women: Normal       T-score at or above -1 SD Osteopenia   T-score between -1 and -2.5 SD Osteoporosis T-score at or below -2.5 SD RECOMMENDATION: 1. All patients should optimize calcium and vitamin D intake. 2. Consider FDA approved medical therapies in postmenopausal women and men aged 17 years and older, based on the following: a. A hip or vertebral (clinical or morphometric) fracture b. T- score < or = -2.5 at the femoral neck or spine after appropriate evaluation to exclude secondary causes c. Low bone mass (T-score between -1.0 and -2.5 at the femoral neck or spine) and a 10 year probability of a hip fracture > or = 3% or a 10 year probability of a major osteoporosis-related fracture > or = 20% based on the [place]-adapted WHO algorithm d. Clinician judgment and/or patient preferences may indicate treatment for people with 10-year fracture probabilities above or below these levels FOLLOW-UP: Patients with diagnosis of osteoporosis or at high risk for fracture should have regular bone mineral density tests. For patients eligible for Medicare, routine testing is allowed once every 2 years. The testing frequency can be increased to one year for patients who have rapidly progressing disease, those who are receiving or discontinuing medical therapy to restore bone mass, or have additional risk factors. I have reviewed this report and agree with the above findings. [facility] Radiology Electronically Signed   By: Lowella Grip III M.D.  On: 09/22/2019 15:24   DG Chest Port 1 View  Result Date: 10/09/2019 CLINICAL DATA:  Shortness of breath, left-sided chest pain EXAM: PORTABLE CHEST 1 VIEW COMPARISON:  10/03/2019 FINDINGS: Single frontal view of the chest demonstrates complete opacification of the left hemithorax consistent with enlarging left pleural effusion and underlying left lung  consolidation. Right chest is clear. No evidence of pneumothorax. No acute bony abnormalities. IMPRESSION: 1. Recurrent large left pleural effusion with complete opacification of the left hemithorax. Electronically Signed   By: Randa Ngo M.D.   On: 10/09/2019 16:57   MM DIAG BREAST TOMO BILATERAL  Result Date: 09/22/2019 CLINICAL DATA:  The patient had lumpectomies of the RIGHT breast, 5:30 o'clock and 10 o'clock location and RIGHT axillary node dissection in August 2020 following neoadjuvant treatment.Patient takes Arimidex. EXAM: DIGITAL DIAGNOSTIC BILATERAL MAMMOGRAM WITH CAD AND TOMO COMPARISON:  10/27/2018 and earlier ACR Breast Density Category b: There are scattered areas of fibroglandular density. FINDINGS: There is skin and trabecular thickening in the RIGHT breast, consistent with previous radiation treatment. Postoperative changes are identified in the LOWER central portion of the RIGHT breast in the UPPER-OUTER QUADRANT consistent previous lumpectomies. LEFT breast is negative. Mammographic images were processed with CAD. IMPRESSION: Expected post treatment changes in the RIGHT breast. No mammographic evidence for malignancy. RECOMMENDATION: Diagnostic mammogram is suggested in 1 year. (Code:[id number]-B-01Y) I have discussed the findings and recommendations with the patient. If applicable, a reminder letter will be sent to the patient regarding the next appointment. BI-RADS CATEGORY  2: Benign. Electronically Signed   By: Nolon Nations M.D.   On: 09/22/2019 16:11   CT RENAL STONE STUDY  Result Date: 09/30/2019 CLINICAL DATA:  Left-sided flank pain.  Kidney stones suspected. EXAM: CT ABDOMEN AND PELVIS WITHOUT CONTRAST TECHNIQUE: Multidetector CT imaging of the abdomen and pelvis was performed following the standard protocol without IV contrast. COMPARISON:  None. FINDINGS: Lower chest: Left pleural effusion. There is opacity at the left lung base, most confluent in the left lower lobe, likely  atelectasis. Previous right lumpectomy with evidence of a postoperative fluid collection measuring approximately 3 x 1.3 cm in size, and overlying skin thickening. Hepatobiliary: Liver normal in size and overall attenuation. There is a well-defined hypoattenuating lesion in the central liver measuring 3.5 cm, consistent with a cyst. There is an adjacent less well-defined area of hypoattenuation in the anterior aspect of segment 4 B, approximately 2.8 cm transversely, which may reflect focal fatty infiltration, but is nonspecific. There is a smaller similar appearing subtle area of hypoattenuation in the right lobe, segment 7, measuring 1.9 cm. No other liver abnormalities. Normal gallbladder. No bile duct dilation. Pancreas: Unremarkable. No pancreatic ductal dilatation or surrounding inflammatory changes. Spleen: Normal in size without focal abnormality. Adrenals/Urinary Tract: No adrenal masses. Kidneys are normal in size, orientation and position. No renal masses, stones or hydronephrosis. Normal ureters. Normal bladder. Stomach/Bowel: Small hiatal hernia. Stomach otherwise unremarkable. Normal small bowel. Colon is normal in caliber. There are scattered colonic diverticula, mostly on the left. No colonic wall thickening or inflammation. Normal appendix visualized. Vascular/Lymphatic: Minor aortic atherosclerosis. No aneurysm. No enlarged lymph nodes. Reproductive: Uterus normal in size. Subserosal fibroid, left posterior lower uterine segment, not well-defined but approximately 2 cm in size. No adnexal masses. Other: No abdominal wall hernia or abnormality. No abdominopelvic ascites. Musculoskeletal: No fracture or acute finding. No osteoblastic or osteolytic lesions. IMPRESSION: 1. No acute findings within the abdomen or pelvis. No findings to account for left-sided flank pain.  No renal or ureteral stones or obstructive uropathy. 2. Left pleural effusion. Left lung base parenchymal opacity consistent with  atelectasis or pneumonia or a combination. 3. Two subtle hypoattenuating lesions in the liver, 1 in the right lobe and the other in the left, not well characterized, with metastatic/neoplastic disease not excluded. There is also a more well-defined and lower attenuation hypoattenuating mass in the liver that is likely a cyst. Given the patient's history of breast carcinoma, follow-up liver MRI without and with contrast is recommended to further characterize the liver lesions. 4. Scattered left colon diverticula without diverticulitis. 5. Minor aortic atherosclerotic calcifications. Electronically Signed   By: Lajean Manes M.D.   On: 09/30/2019 12:04   US Thoracentesis Asp Pleural space w/IMG guide  Result Date: 10/03/2019 INDICATION: History of breast cancer. Left-sided pleural effusion. Request for diagnostic and therapeutic thoracentesis. EXAM: ULTRASOUND GUIDED LEFT THORACENTESIS MEDICATIONS: 1% plain lidocaine, 5 mL COMPLICATIONS: None immediate. Postprocedural chest x-ray negative for pneumothorax. PROCEDURE: An ultrasound guided thoracentesis was thoroughly discussed with the patient and questions answered. The benefits, risks, alternatives and complications were also discussed. The patient understands and wishes to proceed with the procedure. Written consent was obtained. Ultrasound was performed to localize and mark an adequate pocket of fluid in the left chest. The area was then prepped and draped in the normal sterile fashion. 1% Lidocaine was used for local anesthesia. Under ultrasound guidance a 6 Fr Safe-T-Centesis catheter was introduced. Thoracentesis was performed. The catheter was removed and a dressing applied. FINDINGS: A total of approximately 950 mL of hazy serosanguineous fluid was removed. Samples were sent to the laboratory as requested by the clinical team. IMPRESSION: Successful ultrasound guided left thoracentesis yielding 950 mL of pleural fluid. Read by: Ascencion Dike PA-C  Electronically Signed   By: Corrie Mckusick D.O.   On: 10/03/2019 12:43     ELIGIBLE FOR AVAILABLE RESEARCH PROTOCOL: No   ASSESSMENT: 59 y.o. [place], [place] woman status post right breast biopsy x2 on 07/10/2018 for multicentric invasive ductal carcinoma, clinically T1c N1, stage IIB, grade 3, functionally triple negative, with an MIB-1 of 70%  (a) right axillary lymph node biopsied at the same time was positive  (1) neoadjuvant chemotherapy consisting of doxorubicin and cyclophosphamide in dose dense fashion x4 started 08/07/2018, completed 09/16/2018, followed by weekly Abraxane and carboplatin x12 starting 08/07/2018, stopped after 2 cycles (last dose on 10/15/2018) due to peripheral neuropathy   (2) Right breast lumpectomy on 12/27/2018 shows a ypT1b ypN0 residual invasive ductal carcinoma, margins negative.  (a) 5 sentinel nodes were negative.   (b) Estrogen receptor 75% positive, weak, Progesterone receptor negative.  (3) adjuvant radiation  Radiation Treatment Dates: 02/18/2019 through 04/02/2019 Site Technique Total Dose (Gy) Dose per Fx (Gy) Completed Fx Beam Energies  Breast: Breast_Rt 3D 50.4/50.4 1.8 28/28 6X, 10X  Breast: Breast_Rt_axilla 3D 50.4/50.4 1.8 28/28 6X, 10X  Breast: Breast_Rt_Bst 3D 20/20 2 10/10 6X, 10X    (4) genetics testing 08/06/2018 through the Breast + GYN Cancers Panel offered by [facility] found no deleterious mutations in ATM, BARD1, BRCA1, BRCA2, BRIP1, CDH1, CHEK2, DICER1, EPCAM, MLH1, MSH2, MSH6, NBN, NF1, PALB2, PMS2, PTEN, RAD50, RAD51C, RAD51D, SMARCA4, STK11, TP53.   (5) anastrozole started 05/14/2018  (a) bone density 09/22/2019 shows a T score of -0.8 (normal).   PLAN: See my hospital inpatient note for 10/10/2019 for details  I am making [place] a return appointment with [place] on June 9 and will try to start capecitabine at that time.  Virgie Dad. Juwana Thoreson, MD  10/10/19 10:59 AM Medical Oncology and Hematology [facility] [place], [place] [address] Tel. [phone]    Fax. [phone]   I, Wilburn Mylar, am acting as scribe for Dr. Virgie Dad. Shannel Zahm.  I, Lurline Del MD, have reviewed the above documentation for accuracy and completeness, and I agree with the above.   *Total Encounter Time as defined by the Centers for Medicare and Medicaid Services includes, in addition to the face-to-face time of a patient visit (documented in the note above) non-face-to-face time: obtaining and reviewing outside history, ordering and reviewing medications, tests or procedures, care coordination (communications with other health care professionals or caregivers) and documentation in the medical record.

## 2019-10-09 NOTE — ED Notes (Signed)
ED Provider at bedside. 

## 2019-10-09 NOTE — ED Provider Notes (Signed)
[facility] EMERGENCY DEPARTMENT Provider Note   CSN: [id number] Arrival date & time: 10/09/19  1526     History Chief Complaint  Patient presents with  . Shortness of Breath    Autumn Cook is a 59 y.o. female with past medical history significant for breast cancer currently in remission, hypertension, asthma, diabetes presents to emergency department today with chief complaint of left-side pain and shortness of breath x 1 week.  She states she was recently told by oncologist that she is in remission. She underwent chemo and radiation for stage 2 breast cancer. Her oncologist is Dr. [name]    She was feeling short of breath last week and underwent a thoracentesis x 1 week ago. She reports 950 mL of fluid was removed and sent for testing. She does yet to have test results.  She has an appointment scheduled tomorrow with Dr. [name].  Patient states her pain has progressively worsened.  She states her pain is sharp.  Her pain is constant.  She also has a productive cough with yellow and white sputum.  She has tried taking cough medicine with codeine without any symptom improvement.  She is also endorsing fevers at home with T-max of 100.9.  She last took Tylenol yesterday.  She states she has chronic abdominal pain that is unchanged today.  She denies any chills, mopped assist, nausea, emesis, lower extremity edema, urinary symptoms, diarrhea.     Past Medical History:  Diagnosis Date  . Allergy   . Arthritis   . Asthma   . Cancer [facility])    R breast cancer  . Diabetes mellitus without complication (HCC)    no meds now  . Family history of adverse reaction to anesthesia    "entire family takes a long time to wake up- N&V for several days"  . Family history of lung cancer   . Hypertension   . Migraine   . PONV (postoperative nausea and vomiting)     Patient Active Problem List   Diagnosis Date Noted  . Genetic testing 08/13/2018  . Port-A-Cath in place 08/07/2018    . Family history of lung cancer   . Malignant neoplasm of overlapping sites of right breast in female, estrogen receptor positive ([place]) 07/23/2018  . Uncontrolled diabetes mellitus type 2 without complications 03/54/6568  . Achilles tendonitis, bilateral 06/11/2015  . Chest pain with moderate risk for cardiac etiology 10/13/2014  . Hypercholesterolemia 10/13/2014  . Idiopathic scoliosis 08/18/2014  . Hypertension 09/11/2012  . Overweight (BMI 25.0-29.9) 09/11/2012  . Migraine headache 09/11/2012  . Hx of thyroid nodule 09/11/2012  . History of tobacco use 09/11/2012    Past Surgical History:  Procedure Laterality Date  . BREAST CYST EXCISION  1994  . BREAST LUMPECTOMY WITH RADIOACTIVE SEED AND SENTINEL LYMPH NODE BIOPSY Right 12/27/2018   Procedure: RIGHT BREAST LUMPECTOMY WITH RADIOACTIVE SEED X2 AND RIGHT SEED TARGETED AXILLARY SENTINEL LYMPH NODE BIOPSY;  Surgeon: Alphonsa Overall, MD;  Location: [facility];  Service: General;  Laterality: Right;  . DILATION AND CURETTAGE OF UTERUS    . PORTACATH PLACEMENT Right 08/01/2018   Procedure: INSERTION PORT-A-CATH WITH ULTRASOUND;  Surgeon: Alphonsa Overall, MD;  Location: [facility];  Service: General;  Laterality: Right;     OB History   No obstetric history on file.     Family History  Problem Relation Age of Onset  . Hypertension Father   . Cancer Father 25  lung cancer  . Stroke Father   . Heart disease Mother        arrhythmia  . Hypertension Mother   . Kidney failure Sister     Social History   Tobacco Use  . Smoking status: Former Smoker    Packs/day: 1.00    Years: 5.00    Pack years: 5.00    Quit date: 05/08/1988    Years since quitting: 31.4  . Smokeless tobacco: Never Used  Substance Use Topics  . Alcohol use: Yes    Comment: social  . Drug use: No    Home Medications Prior to Admission medications   Medication Sig Start Date End Date Taking? Authorizing Provider   amLODipine (NORVASC) 5 MG tablet Take 1 tablet (5 mg total) by mouth daily. Start with 2.5 mg Patient taking differently: Take 5 mg by mouth daily. Start with 2.5 mg 12/02/18  Yes Copland, Gay Filler, MD  anastrozole (ARIMIDEX) 1 MG tablet Take 1 tablet (1 mg total) by mouth daily. 09/30/19  Yes Magrinat, Virgie Dad, MD  atenolol (TENORMIN) 100 MG tablet TAKE 1 TABLET BY MOUTH EVERY DAY 06/19/19  Yes Copland, Gay Filler, MD  cholecalciferol (VITAMIN D3) 25 MCG (1000 UNIT) tablet Take 1 tablet (1,000 Units total) by mouth daily. 09/30/19  Yes Magrinat, Virgie Dad, MD  fluticasone-salmeterol (ADVAIR HFA) 763-784-1600 MCG/ACT inhaler Inhale 2 puffs into the lungs 2 (two) times daily. 10/02/19  Yes Tanner, Lyndon Code., PA-C  glipiZIDE (GLUCOTROL) 5 MG tablet Start with 5 mg once a day, then increase to twice a day after 1 week.  Continue to increase as directed by MD 04/16/19  Yes Copland, Gay Filler, MD  guaiFENesin-codeine 100-10 MG/5ML syrup Take 5 mLs by mouth every 4 (four) hours as needed for cough. 10/02/19  Yes Tanner, Lyndon Code., PA-C  levofloxacin (LEVAQUIN) 500 MG tablet Take 1 tablet (500 mg total) by mouth daily. 10/02/19  Yes Tanner, Lyndon Code., PA-C  lisinopril-hydrochlorothiazide (ZESTORETIC) 20-12.5 MG tablet Take 2 tablets by mouth daily.   Yes [provider]  Potassium Chloride ER 20 MEQ TBCR Take 20 mEq by mouth daily. 10/02/19  Yes Tanner, Lyndon Code., PA-C  albuterol (VENTOLIN HFA) 108 (90 Base) MCG/ACT inhaler Inhale 2 puffs into the lungs every 4 (four) hours as needed for wheezing or shortness of breath (cough, shortness of breath or wheezing.). 10/15/18   Gardenia Phlegm, NP  benzonatate (TESSALON) 100 MG capsule Take 2 capsules (200 mg total) by mouth 3 (three) times daily as needed for cough. 10/02/19   Tanner, Lyndon Code., PA-C  blood glucose meter kit and supplies KIT Dispense based on patient and insurance preference. Use up to four times daily as directed. (FOR ICD-9 250.00, 250.01). 03/07/16    Charlesetta Shanks, MD  glucose blood (ACCU-CHEK ACTIVE STRIPS) test strip Use as instructed- check blood sugar up to 2x a day 08/30/18   Copland, Gay Filler, MD  Vitamin D, Ergocalciferol, (DRISDOL) 1.25 MG (50000 UNIT) CAPS capsule TAKE 1 CAPSULE BY MOUTH ONE TIME PER WEEK 09/30/19   Copland, Gay Filler, MD    Allergies    Penicillins, Metformin and related, Adhesive [tape], and Asa [aspirin]  Review of Systems   Review of Systems All other systems are reviewed and are negative for acute change except as noted in the HPI.  Physical Exam Updated Vital Signs BP 118/77 (BP Location: Right Arm)   Pulse (!) 103   Temp 99.6 F (37.6 C) (Oral)   Resp Marland Kitchen)  21   Ht 5' 6"  (1.676 m)   Wt 80.4 kg   LMP 11/14/2013   SpO2 96%   BMI 28.62 kg/m   Physical Exam Vitals and nursing note reviewed.  Constitutional:      General: She is not in acute distress.    Appearance: She is not ill-appearing.  HENT:     Head: Normocephalic and atraumatic.     Right Ear: Tympanic membrane and external ear normal.     Left Ear: Tympanic membrane and external ear normal.     Nose: Nose normal.     Mouth/Throat:     Mouth: Mucous membranes are moist.     Pharynx: Oropharynx is clear.  Eyes:     General: No scleral icterus.       Right eye: No discharge.        Left eye: No discharge.     Extraocular Movements: Extraocular movements intact.     Conjunctiva/sclera: Conjunctivae normal.     Pupils: Pupils are equal, round, and reactive to light.  Neck:     Vascular: No JVD.  Cardiovascular:     Rate and Rhythm: Normal rate and regular rhythm.     Pulses: Normal pulses.          Radial pulses are 2+ on the right side and 2+ on the left side.     Heart sounds: Normal heart sounds.  Pulmonary:     Comments: Significantly decreased breath sounds in left upper and lower fields with dullness to percussion.  Symmetric chest rise.  Saturation is 85% on room air, 2 L nasal cannula applied with oxygen saturation of  96%. Abdominal:     Comments: Abdomen is soft, non-distended, and non-tender in all quadrants. No rigidity, no guarding. No peritoneal signs.  Musculoskeletal:        General: Normal range of motion.     Cervical back: Normal range of motion.     Right lower leg: No edema.     Left lower leg: No edema.  Skin:    General: Skin is warm and dry.     Capillary Refill: Capillary refill takes less than 2 seconds.  Neurological:     Mental Status: She is oriented to person, place, and time.     GCS: GCS eye subscore is 4. GCS verbal subscore is 5. GCS motor subscore is 6.     Comments: Fluent speech, no facial droop.  Psychiatric:        Behavior: Behavior normal.     ED Results / Procedures / Treatments   Labs (all labs ordered are listed, but only abnormal results are displayed) Labs Reviewed  CBC WITH DIFFERENTIAL/PLATELET - Abnormal; Notable for the following components:      Result Value   Platelets 463 (*)    Abs Immature Granulocytes 0.08 (*)    All other components within normal limits  BASIC METABOLIC PANEL - Abnormal; Notable for the following components:   Chloride 97 (*)    Glucose, Bld 223 (*)    BUN 23 (*)    Creatinine, Ser 1.12 (*)    GFR calc non Af Amer 54 (*)    All other components within normal limits  CULTURE, BLOOD (ROUTINE X 2)  CULTURE, BLOOD (ROUTINE X 2)  SARS CORONAVIRUS 2 BY RT PCR (HOSPITAL ORDER, [facility])  LACTIC ACID, PLASMA    EKG None  Radiology DG Chest Port 1 View  Result Date: 10/09/2019 CLINICAL  DATA:  Shortness of breath, left-sided chest pain EXAM: PORTABLE CHEST 1 VIEW COMPARISON:  10/03/2019 FINDINGS: Single frontal view of the chest demonstrates complete opacification of the left hemithorax consistent with enlarging left pleural effusion and underlying left lung consolidation. Right chest is clear. No evidence of pneumothorax. No acute bony abnormalities. IMPRESSION: 1. Recurrent large left pleural  effusion with complete opacification of the left hemithorax. Electronically Signed   By: Randa Ngo M.D.   On: 10/09/2019 16:57    Procedures .Critical Care Performed by: Cherre Robins, PA-C Authorized by: Cherre Robins, PA-C   Critical care provider statement:    Critical care time (minutes):  31   Critical care time was exclusive of:  Separately billable procedures and treating other patients and teaching time   Critical care was necessary to treat or prevent imminent or life-threatening deterioration of the following conditions:  Respiratory failure   Critical care was time spent personally by me on the following activities:  Development of treatment plan with patient or surrogate, discussions with consultants, evaluation of patient's response to treatment, examination of patient, obtaining history from patient or surrogate, ordering and performing treatments and interventions, ordering and review of laboratory studies, ordering and review of radiographic studies, pulse oximetry, re-evaluation of patient's condition and review of old charts   I assumed direction of critical care for this patient from another provider in my specialty: no     (including critical care time)  Medications Ordered in ED Medications  morphine 4 MG/ML injection 4 mg (4 mg Intravenous Given 10/09/19 1708)  ondansetron (ZOFRAN) injection 4 mg (4 mg Intravenous Given 10/09/19 1708)    ED Course  I have reviewed the triage vital signs and the nursing notes.  Pertinent labs & imaging results that were available during my care of the patient were reviewed by me and considered in my medical decision making (see chart for details).    MDM Rules/Calculators/A&P                      History provided by patient with additional history obtained from chart review.    Patient seen and examined. Patient presents awake, alert, hemodynamically stable, afebrile, non toxic.  Patient noted to be tachycardic to  110 with 44 respirations in triage.  She has low-grade temp of 99.6.  She is hypoxic to 85% on room air.  She was placed on 2 L nasal cannula with improvement to 96%.  On exam patient has significantly decreased lung sounds on the left.  Chest x-ray was obtained in triage and viewed by me. It shows recurrent large left pleural effusion with complete opacification of the left hemithorax. Labs show no leukocytosis, no anemia, no severe electrolyte derangement.  Glucose elevated 223, patient is known diabetic.  BUN/creatinine slightly elevated at 23/1.12 system compared to 1 week ago when it was 19/0.89.  Lactic acid is within normal range.  Covid swab is negative.  She was given morphine and on reassessment pain has significantly improved.  This case was discussed with Dr. Tamera Punt who agrees with plan to admit. Case discussed with on-call oncologist Dr. Irene Limbo who recommends medical admission for thoracentesis and oncology will see tomorrow. Spoke with Dr. Jonelle Sidle with hospitalist service who agrees to assume care of patient and bring into the hospital for further evaluation and management.    Portions of this note were generated with [employer]. Dictation errors may occur despite best attempts at proofreading.  Final Clinical Impression(s) / ED Diagnoses Final diagnoses:  Pleural effusion  Hypoxia    Rx / DC Orders ED Discharge Orders    None       Cherre Robins, PA-C 10/09/19 1840    Malvin Johns, MD 10/10/19 1511

## 2019-10-09 NOTE — Progress Notes (Signed)
RN received report at 24. Patient is transferred from [facility] by [facility]. Patient is alert and oriented x 4. No pain complain at this time. Vital signs was taken. Room is set up and call light is within patient's reach.

## 2019-10-09 NOTE — ED Triage Notes (Signed)
Pt c/o SOB x 2 weeks-worse with vomiting "white foamy stuff" x 3 days-states she had fluid drawn off lung last week-to triage in w/c with RT is for assessment

## 2019-10-10 ENCOUNTER — Telehealth: Payer: Self-pay

## 2019-10-10 ENCOUNTER — Inpatient Hospital Stay (HOSPITAL_COMMUNITY): Payer: 59

## 2019-10-10 ENCOUNTER — Inpatient Hospital Stay: Payer: 59

## 2019-10-10 ENCOUNTER — Inpatient Hospital Stay: Payer: 59 | Attending: Oncology | Admitting: Oncology

## 2019-10-10 DIAGNOSIS — Z170 Estrogen receptor positive status [ER+]: Secondary | ICD-10-CM

## 2019-10-10 DIAGNOSIS — E1165 Type 2 diabetes mellitus with hyperglycemia: Secondary | ICD-10-CM

## 2019-10-10 DIAGNOSIS — C787 Secondary malignant neoplasm of liver and intrahepatic bile duct: Secondary | ICD-10-CM | POA: Insufficient documentation

## 2019-10-10 DIAGNOSIS — G43009 Migraine without aura, not intractable, without status migrainosus: Secondary | ICD-10-CM

## 2019-10-10 DIAGNOSIS — I158 Other secondary hypertension: Secondary | ICD-10-CM

## 2019-10-10 DIAGNOSIS — J910 Malignant pleural effusion: Secondary | ICD-10-CM

## 2019-10-10 DIAGNOSIS — C50811 Malignant neoplasm of overlapping sites of right female breast: Secondary | ICD-10-CM

## 2019-10-10 DIAGNOSIS — J90 Pleural effusion, not elsewhere classified: Secondary | ICD-10-CM

## 2019-10-10 LAB — HIV ANTIBODY (ROUTINE TESTING W REFLEX): HIV Screen 4th Generation wRfx: NONREACTIVE

## 2019-10-10 LAB — CBC
HCT: 35.5 % — ABNORMAL LOW (ref 36.0–46.0)
Hemoglobin: 11.8 g/dL — ABNORMAL LOW (ref 12.0–15.0)
MCH: 29.6 pg (ref 26.0–34.0)
MCHC: 33.2 g/dL (ref 30.0–36.0)
MCV: 89 fL (ref 80.0–100.0)
Platelets: 396 10*3/uL (ref 150–400)
RBC: 3.99 MIL/uL (ref 3.87–5.11)
RDW: 13.1 % (ref 11.5–15.5)
WBC: 9.8 10*3/uL (ref 4.0–10.5)
nRBC: 0 % (ref 0.0–0.2)

## 2019-10-10 LAB — BASIC METABOLIC PANEL
Anion gap: 13 (ref 5–15)
BUN: 33 mg/dL — ABNORMAL HIGH (ref 6–20)
CO2: 25 mmol/L (ref 22–32)
Calcium: 9.3 mg/dL (ref 8.9–10.3)
Chloride: 99 mmol/L (ref 98–111)
Creatinine, Ser: 1.35 mg/dL — ABNORMAL HIGH (ref 0.44–1.00)
GFR calc Af Amer: 50 mL/min — ABNORMAL LOW (ref >60)
GFR calc non Af Amer: 43 mL/min — ABNORMAL LOW (ref >60)
Glucose, Bld: 287 mg/dL — ABNORMAL HIGH (ref 70–99)
Potassium: 4.3 mmol/L (ref 3.5–5.1)
Sodium: 137 mmol/L (ref 135–145)

## 2019-10-10 LAB — GLUCOSE, CAPILLARY
Glucose-Capillary: 261 mg/dL — ABNORMAL HIGH (ref 70–99)
Glucose-Capillary: 221 mg/dL — ABNORMAL HIGH (ref 70–99)
Glucose-Capillary: 233 mg/dL — ABNORMAL HIGH (ref 70–99)
Glucose-Capillary: 273 mg/dL — ABNORMAL HIGH (ref 70–99)

## 2019-10-10 MED ORDER — PROCHLORPERAZINE EDISYLATE 10 MG/2ML IJ SOLN
10.0000 mg | Freq: Four times a day (QID) | INTRAMUSCULAR | Status: DC | PRN
  Administered 2019-10-10 – 2019-10-13 (×7): 10 mg via INTRAVENOUS
  Filled 2019-10-10 (×7): qty 2

## 2019-10-10 MED ORDER — CAPECITABINE 500 MG PO TABS
1500.0000 mg | ORAL_TABLET | Freq: Two times a day (BID) | ORAL | 6 refills | Status: DC
Start: 2019-10-10 — End: 2019-10-14

## 2019-10-10 MED ORDER — ATENOLOL 25 MG PO TABS
50.0000 mg | ORAL_TABLET | Freq: Every day | ORAL | Status: DC
  Administered 2019-10-11 – 2019-10-14 (×4): 50 mg via ORAL
  Filled 2019-10-10 (×2): qty 2
  Filled 2019-10-10 (×2): qty 1

## 2019-10-10 MED ORDER — MORPHINE SULFATE (PF) 2 MG/ML IV SOLN
1.0000 mg | INTRAVENOUS | Status: DC | PRN
  Administered 2019-10-10 – 2019-10-14 (×15): 1 mg via INTRAVENOUS
  Filled 2019-10-10 (×16): qty 1

## 2019-10-10 MED ORDER — HYDRALAZINE HCL 20 MG/ML IJ SOLN
10.0000 mg | INTRAMUSCULAR | Status: DC | PRN
  Administered 2019-10-13 (×2): 10 mg via INTRAVENOUS
  Filled 2019-10-10 (×2): qty 1

## 2019-10-10 MED ORDER — OXYCODONE HCL 5 MG PO TABS
5.0000 mg | ORAL_TABLET | ORAL | Status: DC | PRN
  Filled 2019-10-10 (×3): qty 1

## 2019-10-10 MED ORDER — SODIUM CHLORIDE 0.9 % IV SOLN: INTRAVENOUS | Status: DC

## 2019-10-10 NOTE — Procedures (Signed)
Thoracentesis Procedure Note  Pre-operative Diagnosis: pleural effusion   Post-operative Diagnosis: same  Indications: treatment of dyspnea   Procedure Details  Consent: Informed consent was obtained. Risks of the procedure were discussed including: infection, bleeding, pain, pneumothorax.  Under sterile conditions the patient was positioned. Betadine solution and sterile drapes were utilized.  1% buffered lidocaine was used to anesthetize the   rib space which was identified via real time [place]. Fluid was obtained without any difficulties and minimal blood loss.  A dressing was applied to the wound and wound care instructions were provided.   Findings 1300 ml of bloody pleural fluid was obtained. A sample was sent to Pathology for cytogenetics, flow, and cell counts, as well as for infection analysis.  Complications:  None; patient tolerated the procedure well.          Condition: stable  Erick Colace ACNP-BC [place] Pager # [phone] OR # [phone] if no answer

## 2019-10-10 NOTE — Progress Notes (Signed)
[name]   DOB:1960/11/10   TZ#:[id number]   QPR#:[id number]  Subjective:  Autumn Cook presented with left flank pain leading to work-up for possible renal stone.  The CT scan showed no stones but there was a left effusion which was tapped on 10/03/2019 and cytology was positive for cancer.  We obtained a liver MRI 10/04/2019 which also shows metastases to the liver.  Her effusion reaccumulated very rapidly, she was very symptomatic from this and has been readmitted for repeat thoracentesis and, if there is further reaccumulation, likely for Pleurx catheter placement.  I met with the patient in her room today with her husband Autumn Cook and her daughter Autumn Cook present.   Objective: African-American woman examined in bed Vitals:   10/10/19 0827 10/10/19 0927  BP:  114/75  Pulse:  94  Resp:    Temp:    SpO2: 98%     Body mass index is 28.62 kg/m. No intake or output data in the 24 hours ending 10/10/19 1043   Sclerae unicteric  Lungs no rales or wheezes--auscultated anterolaterally  Heart regular rate and rhythm  Abdomen soft, +BS  Neuro nonfocal  Breast exam: Deferred  CBG (last 3)  Recent Labs    10/09/19 2136 10/10/19 0755  GLUCAP 213* 261*     Labs:  Lab Results  Component Value Date   WBC 9.8 10/10/2019   HGB 11.8 (L) 10/10/2019   HCT 35.5 (L) 10/10/2019   MCV 89.0 10/10/2019   PLT 396 10/10/2019   NEUTROABS 6.5 10/09/2019    @LASTCHEMISTRY @  Urine Studies No results for input(s): UHGB, CRYS in the last 72 hours.  Invalid input(s): UACOL, UAPR, USPG, UPH, UTP, UGL, UKET, UBIL, UNIT, UROB, ULEU, UEPI, UWBC, URBC, UBAC, CAST, UCOM, BILUA  Basic Metabolic Panel: Recent Labs  Lab 10/09/19 1554 10/10/19 0512  NA 136 137  K 3.6 4.3  CL 97* 99  CO2 26 25  GLUCOSE 223* 287*  BUN 23* 33*  CREATININE 1.12* 1.35*  CALCIUM 9.5 9.3   GFR Estimated Creatinine Clearance: 49.1 mL/min (A) (by C-G formula based on SCr of 1.35 mg/dL (H)). Liver Function Tests: No  results for input(s): AST, ALT, ALKPHOS, BILITOT, PROT, ALBUMIN in the last 168 hours. No results for input(s): LIPASE, AMYLASE in the last 168 hours. No results for input(s): AMMONIA in the last 168 hours. Coagulation profile No results for input(s): INR, PROTIME in the last 168 hours.  CBC: Recent Labs  Lab 10/09/19 1554 10/10/19 0512  WBC 8.7 9.8  NEUTROABS 6.5  --   HGB 12.8 11.8*  HCT 38.4 35.5*  MCV 88.1 89.0  PLT 463* 396   Cardiac Enzymes: No results for input(s): CKTOTAL, CKMB, CKMBINDEX, TROPONINI in the last 168 hours. BNP: Invalid input(s): POCBNP CBG: Recent Labs  Lab 10/09/19 2136 10/10/19 0755  GLUCAP 213* 261*   D-Dimer No results for input(s): DDIMER in the last 72 hours. Hgb A1c No results for input(s): HGBA1C in the last 72 hours. Lipid Profile No results for input(s): CHOL, HDL, LDLCALC, TRIG, CHOLHDL, LDLDIRECT in the last 72 hours. Thyroid function studies No results for input(s): TSH, T4TOTAL, T3FREE, THYROIDAB in the last 72 hours.  Invalid input(s): FREET3 Anemia work up No results for input(s): VITAMINB12, FOLATE, FERRITIN, TIBC, IRON, RETICCTPCT in the last 72 hours. Microbiology Recent Results (from the past 240 hour(s))  SARS CORONAVIRUS 2 (TAT 6-24 HRS) Nasopharyngeal Nasopharyngeal Swab     Status: None   Collection Time: 10/01/19 10:51 AM   Specimen:  Nasopharyngeal Swab  Result Value Ref Range Status   SARS Coronavirus 2 NEGATIVE NEGATIVE Final    Comment: (NOTE) SARS-CoV-2 target nucleic acids are NOT DETECTED. The SARS-CoV-2 RNA is generally detectable in upper and lower respiratory specimens during the acute phase of infection. Negative results do not preclude SARS-CoV-2 infection, do not rule out co-infections with other pathogens, and should not be used as the sole basis for treatment or other patient management decisions. Negative results must be combined with clinical observations, patient history, and epidemiological  information. The expected result is Negative. Fact Sheet for Patients: SugarRoll.be Fact Sheet for Healthcare Providers: https://www.woods-mathews.com/ This test is not yet approved or cleared by the [place] FDA and  has been authorized for detection and/or diagnosis of SARS-CoV-2 by FDA under an Emergency Use Authorization (EUA). This EUA will remain  in effect (meaning this test can be used) for the duration of the COVID-19 declaration under Section 56 4(b)(1) of the Act, 21 U.S.C. section 360bbb-3(b)(1), unless the authorization is terminated or revoked sooner. Performed at [facility], [place] [address] [place], [place] [address]   Body fluid culture     Status: None   Collection Time: 10/03/19 12:10 PM   Specimen: PATH Cytology Pleural fluid  Result Value Ref Range Status   Specimen Description   Final    CYTO PLEU Performed at [place] [address] [place], [place] [address]    Special Requests   Final    NONE Performed at [facility], [place] [address], [place], [place] [address]    Gram Stain NO WBC SEEN NO ORGANISMS SEEN   Final   Culture   Final    NO GROWTH 3 DAYS Performed at [facility], [place] [address], [place], [place] [address]    Report Status 10/06/2019 FINAL  Final  SARS Coronavirus 2 by RT PCR (hospital order, performed in [facility] hospital lab) Nasopharyngeal Nasopharyngeal Swab     Status: None   Collection Time: 10/09/19  5:29 PM   Specimen: Nasopharyngeal Swab  Result Value Ref Range Status   SARS Coronavirus 2 NEGATIVE NEGATIVE Final    Comment: (NOTE) SARS-CoV-2 target nucleic acids are NOT DETECTED. The SARS-CoV-2 RNA is generally detectable in upper and lower respiratory specimens during the acute phase of infection. The lowest concentration of SARS-CoV-2 viral copies this assay can detect is 250 copies / mL. A negative result does not  preclude SARS-CoV-2 infection and should not be used as the sole basis for treatment or other patient management decisions.  A negative result may occur with improper specimen collection / handling, submission of specimen other than nasopharyngeal swab, presence of viral mutation(s) within the areas targeted by this assay, and inadequate number of viral copies (<250 copies / mL). A negative result must be combined with clinical observations, patient history, and epidemiological information. Fact Sheet for Patients:   StrictlyIdeas.no Fact Sheet for Healthcare Providers: BankingDealers.co.za This test is not yet approved or cleared  by the [place] FDA and has been authorized for detection and/or diagnosis of SARS-CoV-2 by FDA under an Emergency Use Authorization (EUA).  This EUA will remain in effect (meaning this test can be used) for the duration of the COVID-19 declaration under Section 564(b)(1) of the Act, 21 U.S.C. section 360bbb-3(b)(1), unless the authorization is terminated or revoked sooner. Performed at [facility], [address] [place], [place] [address]  Studies:  DG Chest Port 1 View  Result Date: 10/09/2019 CLINICAL DATA:  Shortness of breath, left-sided chest pain EXAM: PORTABLE CHEST 1 VIEW COMPARISON:  10/03/2019 FINDINGS: Single frontal view of the chest demonstrates complete opacification of the left hemithorax consistent with enlarging left pleural effusion and underlying left lung consolidation. Right chest is clear. No evidence of pneumothorax. No acute bony abnormalities. IMPRESSION: 1. Recurrent large left pleural effusion with complete opacification of the left hemithorax. Electronically Signed   By: Randa Ngo M.D.   On: 10/09/2019 16:57    Assessment: 59 y.o. [place], [place] woman status post right breast biopsy x2 on 07/10/2018 for multicentric invasive ductal carcinoma, clinically  T1c N1, stage IIB, grade 3, functionally triple negative, with an MIB-1 of 70%             (a) right axillary lymph node biopsied at the same time was positive  (1) neoadjuvant chemotherapy consisting of doxorubicin and cyclophosphamide in dose dense fashion x4 started 08/07/2018, completed 09/16/2018, followed by weekly Abraxane and carboplatin x12 starting 08/07/2018, stopped after 2 cycles (last dose on 10/15/2018) due to peripheral neuropathy   (2) Right breast lumpectomy on 12/27/2018 shows a ypT1b ypN0 residual invasive ductal carcinoma, margins negative.             (a) 5 sentinel nodes were negative.              (b) Estrogen receptor 75% positive, weak, Progesterone receptor negative.  (3) adjuvant radiation  Radiation Treatment Dates: 02/18/2019 through 04/02/2019 Site Technique Total Dose (Gy) Dose per Fx (Gy) Completed Fx Beam Energies  Breast: Breast_Rt 3D 50.4/50.4 1.8 28/28 6X, 10X  Breast: Breast_Rt_axilla 3D 50.4/50.4 1.8 28/28 6X, 10X  Breast: Breast_Rt_Bst 3D 20/20 2 10/10 6X, 10X    (4) genetics testing 08/06/2018 through the Breast + GYN Cancers Panel offered by [facility] found no deleterious mutations in ATM, BARD1, BRCA1, BRCA2, BRIP1, CDH1, CHEK2, DICER1, EPCAM, MLH1, MSH2, MSH6, NBN, NF1, PALB2, PMS2, PTEN, RAD50, RAD51C, RAD51D, SMARCA4, STK11, TP53.   (5) anastrozole started 05/14/2018             (a) bone density 09/22/2019 shows a T score of -0.8 (normal)  (b) anastrozole discontinued June 2021 with disease recurrence  METASTATIC DISEASE June 2021 (6) CT renal stone study 09/30/2019 to evaluate left flank pain shows left pleural effusion and possible liver lesions  (a) cytology from the left pleural effusion 10/03/2019 confirms metastatic disease  (b) liver MRI 10/04/2019 shows multiple liver lesions  (c) prognostic panel requested 10/10/2019 on cytology from 10/03/2019 fluid  Plan:  Makylie's left effusion reaccumulated rapidly.  She is due for repeat  thoracentesis today.  If there is evidence of reaccumulation over the weekend then very likely she will need repeat thoracentesis early next week and at that time a Pleurx catheter should be placed.  I have discussed this with the patient and her family  She understands that she now has stage IV breast cancer and that stage IV breast cancer is not curable with our current knowledge base. The goal of treatment is control. The strategy of treatment is to do only the minimum necessary to control the growth of the tumor so that the patient can have as normal a life as possible. There is no survival advantage in treating aggressively if treating less aggressively results in tumor control. With this strategy stage IV breast cancer in some cases can function as a "chronic illness": something that cannot be quite gotten  rid of but can be controlled for a very long period of time  In her case we are going to start with capecitabine/Xeloda.  This is an outpatient oral chemotherapy  treatment.  I will put the orders in and I will schedule her to see me in the cancer clinic 06/09 assuming she is home by then.  We initiates advanced directive discussions today. She desires resuscitation if necessary and at this point that is in my opinion appropriate.  My partners will be available this weekend as needed. Otherwise I will follow-up with her Monday. Appreciate your help to this patient!   Chauncey Cruel, MD 10/10/2019  10:43 AM Medical Oncology and Hematology [facility] [address] [place], [place] [address] Tel. [phone]    Fax. [phone]

## 2019-10-10 NOTE — Plan of Care (Signed)

## 2019-10-10 NOTE — Progress Notes (Signed)
PROGRESS NOTE  Autumn Cook YNW:[id number] DOB: May 08, 1961 DOA: 10/09/2019 PCP: Darreld Mclean, MD   LOS: 1 day   Brief Narrative / Interim history: Autumn Cook is a 59 y.o. female with a pertinent medical history of diabetes mellitus type 2, hypertension, triple negative breast cancer (in remission), HTN, and asthma who has been experiencing increasing shortness of breath over the last 2 to 3 weeks with left-sided chest wall pain.  Patient previously underwent chemo and radiation for stage 2 breast cancer diagnosed 06/2018. Her oncologist is [name]. She was feeling short of breath last week and underwent a thoracentesis on May 28th. She reports 950 mL of fluid was removed and sent for testing. Fluid analysis without bacterial growth but with malignant cells. [name] followed-up in person on 6/4.  She had started experiencing shortness of breath again following thoracentesis and came to the ED at [facility] with a productive cough and pleuritic type of chest pain mostly on the left side.  Patient stated her chest wall pain has progressively worsened, her pain is sharp and constant. She also had a productive cough with yellow and white sputum. She has tried taking cough medicine with codeine without any symptom improvement. She endorsed fevers at home with T-max of 100.9. She also stated she has chronic abdominal pain that is unchanged today. She denied any chills, hemoptysis, nausea, emesis, lower extremity edema, urinary symptoms, diarrhea.   In the ED, X-ray shows recurrence of the left-sided pleural effusion. Covid test was negative. Patient was mildly hypoxic but not febrile. CBC largely unremarkable, lactic acid was normal. Creatinine is increased from baseline. Blood glucose was 223. EKG was showing sinus tachycardia.   Patient is being admitted for respiratory failure secondary to recurrent pleural effusion and transferred to [facility].   Subjective  / 24h Interval events: Patient was resting in bed eating breakfast with daughter at bedside during morning rounds. She appeared depressed. She inquired about the etiology of the recurrent plural effusion. I explained that we were still awaiting lab results from the initial thoracentesis, but likely secondary to suspected metastatic malignancy. Discussed IR thoracentesis procedure hopefully today. Patient had no further questions.   Assessment & Plan: Principal Problem Acute respiratory failure with hypoxia secondary to recurrent large plural effusion with history of breast cancer and possible hepatic metastases:\ - Suspected recurrence of triple negative breast cancer as stage IV with liver involvement. - Recurrent large plural effusion on X-ray s/p thoracentesis on 5/28. Fluid analysis negative for bacterial growth positive for malignant cells. - Patient's oncologist [name] on care team and saw patient in person today. - Recommendation for repeat thoracentesis today. Monitor for recurrence over the weekend with CXR. - If recurrence, repeat tap with drain in place. - Per oncology, d/c anastrozole.  Active Problems Type 2 diabetes melitus: - Blood glucose levels >200. Obtaining A1c in AM per diabetes coordinator request.  - Continue sliding scale insulin per unit protocol.  Essential hypertension: - Continue Atenolol, which was reduced to 50mg  from 100mg  due to soft BP. - Hold all other antihypertensives due to ARF and soft BP. - Gentle rehydration with IV fluids.  Acute renal failure: - BUN 33 on 6/4, up from baseline 14-19 in May 21. Creatinine 1.35, elevated as well from baseline of  0.89-0.91. GFR down from >60 to 43. Monitor. - Hold ACEi, HCTZ, avoid nephrotoxic agents. - Gentle rehydration with IVF.  History of triple negative breast cancer / possible new  bilobar hepatic metastases:  - Continue outpatient follow-up with Oncologist [name] as directed.     Scheduled  Meds: . amLODipine  5 mg Oral Daily  . anastrozole  1 mg Oral Daily  . atenolol  100 mg Oral Daily  . insulin aspart  0-9 Units Subcutaneous TID WC  . mometasone-formoterol  2 puff Inhalation BID  . potassium chloride SA  20 mEq Oral Daily   Continuous Infusions: PRN Meds:.albuterol, hydrALAZINE, morphine injection, ondansetron **OR** ondansetron (ZOFRAN) IV, oxyCODONE  DVT prophylaxis: SCDs in light of upcoming thoracentesis. Code Status: Full code Family Communication: Daughter at bedside on morning rounds.  Status is: Inpatient  Remains inpatient appropriate because:Inpatient level of care appropriate due to severity of illness   Dispo: The patient is from: Home              Anticipated d/c is to: Home              Anticipated d/c date is: 2 days              Patient currently is not medically stable to d/c.   Consultants:  Oncology. Much appreciated.  Procedures:  2D echo: None Foley: None BiPAP: None HD: None  Microbiology  Covid negative. Blood cultures pending.  Antimicrobials: None   Objective: Vitals:   10/10/19 0743 10/10/19 0818 10/10/19 0827 10/10/19 0927  BP:    114/75  Pulse:    94  Resp:      Temp:      TempSrc:      SpO2: 95% (!) 89% 98%   Weight:      Height:       No intake or output data in the 24 hours ending 10/10/19 1054 Filed Weights   10/09/19 1536  Weight: 80.4 kg    Examination:  Constitutional: NAD Eyes: no scleral icterus ENMT: Mucous membranes are moist.  Neck: normal, supple Respiratory: On 1L O2 by [place]. Left lung field breath sounds greatly decreased with positive egophony. Clear to auscultation in right lung fields with mild basilar expiratory wheezing. Symmetric chest rise. Normal respiratory effort. No accessory muscle use.  Cardiovascular: Regular rate and rhythm, no murmurs / rubs / gallops. No LE edema. Good peripheral pulses Abdomen: non distended, no tenderness. Bowel sounds positive.  Musculoskeletal: no  clubbing / cyanosis.  Skin: no rashes Neurologic: Grossly non-focal. Psychiatric: Normal judgment and insight. Alert and oriented x 3. Depressed mood noted.     Data Reviewed: I have independently reviewed following labs and imaging studies   CBC: Recent Labs  Lab 10/09/19 1554 10/10/19 0512  WBC 8.7 9.8  NEUTROABS 6.5  --   HGB 12.8 11.8*  HCT 38.4 35.5*  MCV 88.1 89.0  PLT 463* 967   Basic Metabolic Panel: Recent Labs  Lab 10/09/19 1554 10/10/19 0512  NA 136 137  K 3.6 4.3  CL 97* 99  CO2 26 25  GLUCOSE 223* 287*  BUN 23* 33*  CREATININE 1.12* 1.35*  CALCIUM 9.5 9.3   Liver Function Tests: No results for input(s): AST, ALT, ALKPHOS, BILITOT, PROT, ALBUMIN in the last 168 hours. Coagulation Profile: No results for input(s): INR, PROTIME in the last 168 hours. HbA1C: No results for input(s): HGBA1C in the last 72 hours. CBG: Recent Labs  Lab 10/09/19 2136 10/10/19 0755  GLUCAP 213* 261*    Recent Results (from the past 240 hour(s))  SARS CORONAVIRUS 2 (TAT 6-24 HRS) Nasopharyngeal Nasopharyngeal Swab     Status: None  Collection Time: 10/01/19 10:51 AM   Specimen: Nasopharyngeal Swab  Result Value Ref Range Status   SARS Coronavirus 2 NEGATIVE NEGATIVE Final    Comment: (NOTE) SARS-CoV-2 target nucleic acids are NOT DETECTED. The SARS-CoV-2 RNA is generally detectable in upper and lower respiratory specimens during the acute phase of infection. Negative results do not preclude SARS-CoV-2 infection, do not rule out co-infections with other pathogens, and should not be used as the sole basis for treatment or other patient management decisions. Negative results must be combined with clinical observations, patient history, and epidemiological information. The expected result is Negative. Fact Sheet for Patients: SugarRoll.be Fact Sheet for Healthcare Providers: https://www.woods-mathews.com/ This test is not yet  approved or cleared by the [place] FDA and  has been authorized for detection and/or diagnosis of SARS-CoV-2 by FDA under an Emergency Use Authorization (EUA). This EUA will remain  in effect (meaning this test can be used) for the duration of the COVID-19 declaration under Section 56 4(b)(1) of the Act, 21 U.S.C. section 360bbb-3(b)(1), unless the authorization is terminated or revoked sooner. Performed at [facility], [place] [address] [place], [place] [address]   Body fluid culture     Status: None   Collection Time: 10/03/19 12:10 PM   Specimen: PATH Cytology Pleural fluid  Result Value Ref Range Status   Specimen Description   Final    CYTO PLEU Performed at [facility] [address], [place], [place] [address]    Special Requests   Final    NONE Performed at [facility], [place] [address], [place], [place] [address]    Gram Stain NO WBC SEEN NO ORGANISMS SEEN   Final   Culture   Final    NO GROWTH 3 DAYS Performed at [facility], [place] [address], [place], [place] [address]    Report Status 10/06/2019 FINAL  Final  SARS Coronavirus 2 by RT PCR (hospital order, performed in [facility] hospital lab) Nasopharyngeal Nasopharyngeal Swab     Status: None   Collection Time: 10/09/19  5:29 PM   Specimen: Nasopharyngeal Swab  Result Value Ref Range Status   SARS Coronavirus 2 NEGATIVE NEGATIVE Final    Comment: (NOTE) SARS-CoV-2 target nucleic acids are NOT DETECTED. The SARS-CoV-2 RNA is generally detectable in upper and lower respiratory specimens during the acute phase of infection. The lowest concentration of SARS-CoV-2 viral copies this assay can detect is 250 copies / mL. A negative result does not preclude SARS-CoV-2 infection and should not be used as the sole basis for treatment or other patient management decisions.  A negative result may occur with improper specimen collection / handling, submission of specimen  other than nasopharyngeal swab, presence of viral mutation(s) within the areas targeted by this assay, and inadequate number of viral copies (<250 copies / mL). A negative result must be combined with clinical observations, patient history, and epidemiological information. Fact Sheet for Patients:   StrictlyIdeas.no Fact Sheet for Healthcare Providers: BankingDealers.co.za This test is not yet approved or cleared  by the [place] FDA and has been authorized for detection and/or diagnosis of SARS-CoV-2 by FDA under an Emergency Use Authorization (EUA).  This EUA will remain in effect (meaning this test can be used) for the duration of the COVID-19 declaration under Section 564(b)(1) of the Act, 21 U.S.C. section 360bbb-3(b)(1), unless the authorization is terminated or revoked sooner. Performed at [facility], [place] [place],  [place] [address]      Radiology Studies: DG Chest Port 1 View  Result Date: 10/09/2019 CLINICAL DATA:  Shortness of breath, left-sided chest pain EXAM: PORTABLE CHEST 1 VIEW COMPARISON:  10/03/2019 FINDINGS: Single frontal view of the chest demonstrates complete opacification of the left hemithorax consistent with enlarging left pleural effusion and underlying left lung consolidation. Right chest is clear. No evidence of pneumothorax. No acute bony abnormalities. IMPRESSION: 1. Recurrent large left pleural effusion with complete opacification of the left hemithorax. Electronically Signed   By: Randa Ngo M.D.   On: 10/09/2019 16:57

## 2019-10-10 NOTE — Progress Notes (Signed)
Inpatient Diabetes Program Recommendations  AACE/ADA: New Consensus Statement on Inpatient Glycemic Control (2015)  Target Ranges:  Prepandial:   less than 140 mg/dL      Peak postprandial:   less than 180 mg/dL (1-2 hours)      Critically ill patients:  140 - 180 mg/dL   Lab Results  Component Value Date   GLUCAP 261 (H) 10/10/2019   HGBA1C 5.7 12/02/2018    Review of Glycemic Control  Diabetes history: DM 2 Outpatient Diabetes medications: Glipizide in the past Current orders for Inpatient glycemic control:  Novolog 0-9 units tid   Inpatient Diabetes Program Recommendations:    Glucose trends consistently in the 200's on presentation and while here. Consider obtaining an A1c to assess glucose control over the past 2-3 months.  Thanks,  Tama Headings RN, MSN, BC-ADM Inpatient Diabetes Coordinator Team Pager [phone] (8a-5p)

## 2019-10-10 NOTE — Progress Notes (Addendum)
PROGRESS NOTE    Autumn Cook  GGE:[id number] DOB: 04-06-1961 DOA: 10/09/2019 PCP: Darreld Mclean, MD    Brief Narrative:  Patient admitted to the hospital with recurrent malignant left pleural effusion.  59 year old female who presented with dyspnea.  She does have a significant past medical history for type 2 diabetes mellitus, hypertension and metastatic breast cancer.  May 28 patient underwent thoracentesis to the left pleural effusion, fluid was positive for malignant cells.  Her dyspnea has been rapidly progressive, now dyspneic with minimal efforts.  It has been associated with pleuritic chest pain with cough. On her initial physical examination blood pressure 104/72, pulse rate 89, respiratory rate 21, temperature 98.5, oxygen saturation 98%.  Her lungs are clear to auscultation bilaterally, heart S1-S2, present rhythmic, soft abdomen, no lower extremity edema. Sodium 136, potassium 3.6, chloride 97, bicarb 26, glucose 223, BUN 23, creatinine 1.12, white count 8.7, hemoglobin 12.8, hematocrit 38.4, platelets 463.  SARS COVID-19 negative.  Chest radiograph with a large left pleural effusion with mediastinal shift to the right.  EKG 109 bpm, normal axis, normal intervals, sinus rhythm, no ST segment or T wave changes.  Patient has been placed on supplemental 02 per [place], prescribed analgesics and IV fluids.   Consulted IR/ Pulmonary for US guided thoracentesis.    Assessment & Plan:   Principal Problem:   Pleural effusion Active Problems:   Hypertension   Malignant neoplasm of overlapping sites of right breast in female, estrogen receptor positive ([place])   Type 2 diabetes mellitus with hyperglycemia ([place])   1. Acute hypoxic respiratory failure due to recurrent large malignant left pleural effusion/ in the setting of metastatic breast cancer. Patient with rapid recurrent pleural effusion, compared to May 28 images, this occasion the left effusion seems to be worse in size. Patient  with significant dyspnea, including with minimal efforts. Oxymetry on room air is 89% and 94 to 95% on 1 L/ min per [place].   Case discussed with pulmonary, patient ill have thoracentesis today and will plan to place a pleural catheter on this hospitalization to prevent rapid reaccumulation of fluid.   Continue pain control and supplemental 02 per [place].   Patient at high risk for worsening respiratory failure.   2. AKI. Worsening renal function, patient with very poor oral intake, suspected pre-renal renal failure.   Will add gentle IV fluids and will follow up on renal function in am, avoid hypotension and nephrotoxic medications.   3. T2DM. Patient with poor oral intake, glucose has been elevated at 200. Will continue insulin sliding scale for now for glucose cover and monitoring.   Hold on basal insulin for now due to risk of hypotension.   4. HTN. Patient with blood pressure systolic in the low 465'K, will continue to hold on lisinopril, hctz, amlodipine and will decrease dose of atenolol to 50 mg daily.   Status is: Inpatient  Remains inpatient appropriate because:Inpatient level of care appropriate due to severity of illness   Dispo: The patient is from: Home              Anticipated d/c is to: Home              Anticipated d/c date is: 3 days              Patient currently is not medically stable to d/c.   DVT prophylaxis: Enoxaparin   Code Status:   full  Family Communication:  I spoke with patient's husband and daughter  at the bedside, we talked in detail about patient's condition, plan of care and prognosis and all questions were addressed.     Consultants:   Oncology   Pulmonary   Subjective: Patient continue to have significant dyspnea, worse with movement, moderate to severe in intensity, no nausea or vomiting, but poor oral intake. Positive pleuritic chest pain.   Objective: Vitals:   10/10/19 0818 10/10/19 0827 10/10/19 0927 10/10/19 1058  BP:   114/75 101/75    Pulse:   94 86  Resp:    17  Temp:    98.8 F (37.1 C)  TempSrc:    Oral  SpO2: (!) 89% 98%  94%  Weight:      Height:       No intake or output data in the 24 hours ending 10/10/19 1248 Filed Weights   10/09/19 1536  Weight: 80.4 kg    Examination:   General: Not in pain or dyspnea, deconditioned  Neurology: Awake and alert, non focal  E ENT: mild pallor, no icterus, oral mucosa moist Cardiovascular: No JVD. S1-S2 present, rhythmic, no gallops, rubs, or murmurs. No lower extremity edema. Pulmonary: positive bilateral breath sounds, decreased on the left side, with no frank wheezing, rhonchi or rales. Gastrointestinal. Abdomen with, no organomegaly, non tender, no rebound or guarding Skin. No rashes Musculoskeletal: no joint deformities     Data Reviewed: I have personally reviewed following labs and imaging studies  CBC: Recent Labs  Lab 10/09/19 1554 10/10/19 0512  WBC 8.7 9.8  NEUTROABS 6.5  --   HGB 12.8 11.8*  HCT 38.4 35.5*  MCV 88.1 89.0  PLT 463* 510   Basic Metabolic Panel: Recent Labs  Lab 10/09/19 1554 10/10/19 0512  NA 136 137  K 3.6 4.3  CL 97* 99  CO2 26 25  GLUCOSE 223* 287*  BUN 23* 33*  CREATININE 1.12* 1.35*  CALCIUM 9.5 9.3   GFR: Estimated Creatinine Clearance: 49.1 mL/min (A) (by C-G formula based on SCr of 1.35 mg/dL (H)). Liver Function Tests: No results for input(s): AST, ALT, ALKPHOS, BILITOT, PROT, ALBUMIN in the last 168 hours. No results for input(s): LIPASE, AMYLASE in the last 168 hours. No results for input(s): AMMONIA in the last 168 hours. Coagulation Profile: No results for input(s): INR, PROTIME in the last 168 hours. Cardiac Enzymes: No results for input(s): CKTOTAL, CKMB, CKMBINDEX, TROPONINI in the last 168 hours. BNP (last 3 results) No results for input(s): PROBNP in the last 8760 hours. HbA1C: No results for input(s): HGBA1C in the last 72 hours. CBG: Recent Labs  Lab 10/09/19 2136 10/10/19 0755  10/10/19 1200  GLUCAP 213* 261* 221*   Lipid Profile: No results for input(s): CHOL, HDL, LDLCALC, TRIG, CHOLHDL, LDLDIRECT in the last 72 hours. Thyroid Function Tests: No results for input(s): TSH, T4TOTAL, FREET4, T3FREE, THYROIDAB in the last 72 hours. Anemia Panel: No results for input(s): VITAMINB12, FOLATE, FERRITIN, TIBC, IRON, RETICCTPCT in the last 72 hours.    Radiology Studies: I have reviewed all of the imaging during this hospital visit personally     Scheduled Meds: . anastrozole  1 mg Oral Daily  . [START ON 10/11/2019] atenolol  50 mg Oral Daily  . insulin aspart  0-9 Units Subcutaneous TID WC  . mometasone-formoterol  2 puff Inhalation BID   Continuous Infusions: . sodium chloride 75 mL/hr at 10/10/19 1235     LOS: 1 day        Carlyn Mullenbach Gerome Apley, MD

## 2019-10-10 NOTE — Consult Note (Signed)
NAME:  Autumn Cook, MRN:  [id number], DOB:  [birth year], LOS: 1 ADMISSION DATE:  10/09/2019, CONSULTATION DATE:  6/4 REFERRING MD:  Arrien , CHIEF COMPLAINT:  Pleural effusion and dyspnea    Brief History   Is a 59 year old female patient with significant history of metastatic breast cancer followed by hematology/oncology.  Most recently underwent therapeutic and diagnostic thoracentesis on 5/28 demonstrating The presence of malignant cells in her left pleural fluid.  950 mL of fluid was aspirated during that procedure.  She presents once again to the emergency room on 6/3 with chief complaint of shortness of breath chest x-ray showed recurrence of pleural effusion essentially occupying her entire left hemithorax pulmonary has been asked to evaluate for thoracentesis and also for consideration of Pleurx catheter.  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 She is s/p neoadjuvant chemotherapy consisting of doxorubicin and cyclophosphamide followed by by weekly Abraxane and carboplatin x12 starting 08/07/2018, stopped after 2 cycles (last dose on 10/15/2018) due to peripheral neuropathy also had adjunctive XRT.  Significant Hospital Events   6/4 admitted  Consults:  PCCM consulted   Procedures:    Significant Diagnostic Tests:    Micro Data:    Antimicrobials:     Interim history/subjective:   Still short of breath  Objective   Blood pressure 101/75, pulse 86, temperature 98.8 F (37.1 C), temperature source Oral, resp. rate 17, height _0  (1.676 m), weight 80.4 kg, last menstrual period 11/14/2013, SpO2 94 %.       No intake or output data in the 24 hours ending 10/10/19 1301 Filed Weights   10/09/19 1536  Weight: 80.4 kg    Examination: General: 59 year old black female resting in bed currently no acute distress HENT: Normocephalic atraumatic no jugular venous distention  appreciated mucous membranes moist Lungs: Some scattered rhonchi, remarkably diminished on the left currently no accessory use but is on 2 L nasal cannula Cardiovascular: Regular rate and rhythm without murmur rub or gallop Abdomen: Soft not tender no organomegaly Extremities: Warm and dry with brisk capillary refill Neuro: Awake oriented no focal deficits appreciated GU: Voiding spontaneously  Resolved Hospital Problem list     Assessment & Plan:   Dyspnea Metastatic right breast cancer Malignant left pleural effusion Diabetes Asthma  Discussion  Worsening dyspnea in setting of re accumulation of malignant pleural effusion. She will need Pleur-x cath. But can't do this today. More likely over the weekend or Monday but her symptom burden warrants therapeutic thoracentesis today   Plan Will proceed w/ therapeutic thora Will touch base w/ either Dr Carlis Abbott Or Dr Tamala Julian might be able to do this over the weekend but more likely Monday     Best practice:  Per primary   Labs   CBC: Recent Labs  Lab 10/09/19 1554 10/10/19 0512  WBC 8.7 9.8  NEUTROABS 6.5  --   HGB 12.8 11.8*  HCT 38.4 35.5*  MCV 88.1 89.0  PLT 463* 093    Basic Metabolic Panel: Recent Labs  Lab 10/09/19 1554 10/10/19 0512  NA 136 137  K 3.6 4.3  CL 97* 99  CO2 26 25  GLUCOSE 223* 287*  BUN 23* 33*  CREATININE 1.12* 1.35*  CALCIUM 9.5 9.3   GFR: Estimated Creatinine Clearance: 49.1 mL/min (A) (by C-G formula based on SCr of 1.35 mg/dL (H)). Recent Labs  Lab 10/09/19 1554 10/09/19 1701 10/10/19 2671  WBC 8.7  --  9.8  LATICACIDVEN  --  1.7  --     Liver Function Tests: No results for input(s): AST, ALT, ALKPHOS, BILITOT, PROT, ALBUMIN in the last 168 hours. No results for input(s): LIPASE, AMYLASE in the last 168 hours. No results for input(s): AMMONIA in the last 168 hours.  ABG    Component Value Date/Time   HCO3 29.1 (H) 03/07/2016 1420   TCO2 31 03/07/2016 1420   O2SAT 44.0  03/07/2016 1420     Coagulation Profile: No results for input(s): INR, PROTIME in the last 168 hours.  Cardiac Enzymes: No results for input(s): CKTOTAL, CKMB, CKMBINDEX, TROPONINI in the last 168 hours.  HbA1C: Hgb A1c MFr Bld  Date/Time Value Ref Range Status  12/02/2018 10:42 AM 5.7 4.6 - 6.5 % Final    Comment:    Glycemic Control Guidelines for People with Diabetes:Non Diabetic:  <6%Goal of Therapy: <7%Additional Action Suggested:  >8%   07/01/2018 10:41 AM 7.9 (H) 4.6 - 6.5 % Final    Comment:    Glycemic Control Guidelines for People with Diabetes:Non Diabetic:  <6%Goal of Therapy: <7%Additional Action Suggested:  >8%     CBG: Recent Labs  Lab 10/09/19 2136 10/10/19 0755 10/10/19 1200  GLUCAP 213* 261* 221*    Review of Systems:   Review of Systems  Constitutional: Positive for malaise/fatigue. Negative for chills and fever.  HENT: Negative.   Eyes: Negative.   Respiratory: Positive for shortness of breath.   Cardiovascular: Positive for chest pain.  Gastrointestinal: Negative.   Genitourinary: Negative.   Musculoskeletal: Negative.   Neurological: Negative.   Endo/Heme/Allergies: Negative.   Psychiatric/Behavioral: Negative.      Past Medical History  She,  has a past medical history of Allergy, Arthritis, Asthma, Cancer ([place]), Diabetes mellitus without complication ([place]), Family history of adverse reaction to anesthesia, Family history of lung cancer, Hypertension, Migraine, and PONV (postoperative nausea and vomiting).   Surgical History    Past Surgical History:  Procedure Laterality Date   BREAST CYST EXCISION  1994   BREAST LUMPECTOMY WITH RADIOACTIVE SEED AND SENTINEL LYMPH NODE BIOPSY Right 12/27/2018   Procedure: RIGHT BREAST LUMPECTOMY WITH RADIOACTIVE SEED X2 AND RIGHT SEED TARGETED AXILLARY SENTINEL LYMPH NODE BIOPSY;  Surgeon: Alphonsa Overall, MD;  Location: [facility];  Service: General;  Laterality: Right;   DILATION AND  CURETTAGE OF UTERUS     PORTACATH PLACEMENT Right 08/01/2018   Procedure: INSERTION PORT-A-CATH WITH ULTRASOUND;  Surgeon: Alphonsa Overall, MD;  Location: [facility];  Service: General;  Laterality: Right;     Social History   reports that she quit smoking about 31 years ago. She has a 5.00 pack-year smoking history. She has never used smokeless tobacco. She reports current alcohol use. She reports that she does not use drugs.   Family History   Her family history includes Cancer (age of onset: 63) in her father; Heart disease in her mother; Hypertension in her father and mother; Kidney failure in her sister; Stroke in her father.   Allergies Allergies  Allergen Reactions   Penicillins Hives    Did it involve swelling of the face/tongue/throat, SOB, or low BP? Yes--syncope episode Did it involve sudden or severe rash/hives, skin peeling, or any reaction on the inside of your mouth or nose? No Did you need to seek medical attention at a hospital or doctor's office? Yes When did it last happen? 37-38 YEARS AGO If all above answers are "  NO", may proceed with cephalosporin use.    Metformin And Related Diarrhea   Adhesive [Tape] Other (See Comments)    "severe bruising"  ALSO SKIN GLUE   Asa [Aspirin] Other (See Comments)    GI upset    Mango Flavor Hives   Olive Tree Hives    Pt tolerates olive oil     Home Medications  Prior to Admission medications   Medication Sig Start Date End Date Taking? Authorizing Provider  albuterol (VENTOLIN HFA) 108 (90 Base) MCG/ACT inhaler Inhale 2 puffs into the lungs every 4 (four) hours as needed for wheezing or shortness of breath (cough, shortness of breath or wheezing.). 10/15/18  Yes Causey, Charlestine Massed, NP  amLODipine (NORVASC) 5 MG tablet Take 1 tablet (5 mg total) by mouth daily. Start with 2.5 mg Patient taking differently: Take 5 mg by mouth daily.  12/02/18  Yes Copland, Gay Filler, MD  anastrozole (ARIMIDEX) 1  MG tablet Take 1 tablet (1 mg total) by mouth daily. 09/30/19  Yes Magrinat, Virgie Dad, MD  atenolol (TENORMIN) 100 MG tablet TAKE 1 TABLET BY MOUTH EVERY DAY 06/19/19  Yes Copland, Gay Filler, MD  fluticasone-salmeterol (ADVAIR HFA) 115-21 MCG/ACT inhaler Inhale 2 puffs into the lungs 2 (two) times daily. 10/02/19  Yes Tanner, Lyndon Code., PA-C  guaiFENesin-codeine 100-10 MG/5ML syrup Take 5 mLs by mouth every 4 (four) hours as needed for cough. 10/02/19  Yes Tanner, Lyndon Code., PA-C  levofloxacin (LEVAQUIN) 500 MG tablet Take 1 tablet (500 mg total) by mouth daily. 10/02/19  Yes Tanner, Lyndon Code., PA-C  lisinopril-hydrochlorothiazide (ZESTORETIC) 20-12.5 MG tablet Take 2 tablets by mouth daily.   Yes [provider]  Potassium Chloride ER 20 MEQ TBCR Take 20 mEq by mouth daily. 10/02/19  Yes Tanner, Lyndon Code., PA-C  Vitamin D, Ergocalciferol, (DRISDOL) 1.25 MG (50000 UNIT) CAPS capsule TAKE 1 CAPSULE BY MOUTH ONE TIME PER WEEK Patient taking differently: Take 50,000 Units by mouth every Saturday.  09/30/19  Yes Copland, Gay Filler, MD  benzonatate (TESSALON) 100 MG capsule Take 2 capsules (200 mg total) by mouth 3 (three) times daily as needed for cough. Patient not taking: Reported on 10/10/2019 10/02/19   Sandi Mealy E., PA-C  blood glucose meter kit and supplies KIT Dispense based on patient and insurance preference. Use up to four times daily as directed. (FOR ICD-9 250.00, 250.01). 03/07/16   Charlesetta Shanks, MD  capecitabine (XELODA) 500 MG tablet Take 3 tablets (1,500 mg total) by mouth 2 (two) times daily after a meal. Take daily for 14 days, then do not take for 7 days, then repeat. 10/10/19   Magrinat, Virgie Dad, MD  cholecalciferol (VITAMIN D3) 25 MCG (1000 UNIT) tablet Take 1 tablet (1,000 Units total) by mouth daily. Patient not taking: Reported on 10/10/2019 09/30/19   Magrinat, Virgie Dad, MD  glipiZIDE (GLUCOTROL) 5 MG tablet Start with 5 mg once a day, then increase to twice a day after 1 week.  Continue to  increase as directed by MD Patient not taking: Reported on 10/10/2019 04/16/19   Copland, Gay Filler, MD  glucose blood (ACCU-CHEK ACTIVE STRIPS) test strip Use as instructed- check blood sugar up to 2x a day 08/30/18   Copland, Gay Filler, MD     Critical care time: NA    Erick Colace ACNP-BC [place] Pager # [phone] OR # [phone] if no answer

## 2019-10-10 NOTE — Telephone Encounter (Signed)
Oral Oncology Patient Advocate Encounter  Received notification from [place]  that prior authorization for Xeloda is required.  PA submitted on CoverMyMeds Key [id number] Status is pending  Oral Oncology Clinic will continue to follow.  [facility] Patient [place] Phone [phone] Fax [phone] 10/10/2019 2:14 PM

## 2019-10-11 LAB — BASIC METABOLIC PANEL
Anion gap: 8 (ref 5–15)
BUN: 28 mg/dL — ABNORMAL HIGH (ref 6–20)
CO2: 25 mmol/L (ref 22–32)
Calcium: 8.9 mg/dL (ref 8.9–10.3)
Chloride: 103 mmol/L (ref 98–111)
Creatinine, Ser: 0.94 mg/dL (ref 0.44–1.00)
GFR calc Af Amer: >60 mL/min (ref >60)
GFR calc non Af Amer: >60 mL/min (ref >60)
Glucose, Bld: 196 mg/dL — ABNORMAL HIGH (ref 70–99)
Potassium: 3.9 mmol/L (ref 3.5–5.1)
Sodium: 136 mmol/L (ref 135–145)

## 2019-10-11 LAB — HEMOGLOBIN A1C
Hgb A1c MFr Bld: 7.8 % — ABNORMAL HIGH (ref 4.8–5.6)
Mean Plasma Glucose: 177.16 mg/dL

## 2019-10-11 LAB — GLUCOSE, CAPILLARY
Glucose-Capillary: 224 mg/dL — ABNORMAL HIGH (ref 70–99)
Glucose-Capillary: 221 mg/dL — ABNORMAL HIGH (ref 70–99)
Glucose-Capillary: 224 mg/dL — ABNORMAL HIGH (ref 70–99)
Glucose-Capillary: 183 mg/dL — ABNORMAL HIGH (ref 70–99)

## 2019-10-11 NOTE — Progress Notes (Signed)
NAME:  Autumn Cook, MRN:  [id number], DOB:  12-20-1960, LOS: 2 ADMISSION DATE:  10/09/2019, CONSULTATION DATE:  6/4 REFERRING MD:  Arrien , CHIEF COMPLAINT:  Pleural effusion and dyspnea    Brief History   Is a 59 year old female patient with significant history of metastatic breast cancer followed by hematology/oncology.  Most recently underwent therapeutic and diagnostic thoracentesis on 5/28 demonstrating The presence of malignant cells in her left pleural fluid.  950 mL of fluid was aspirated during that procedure.  She presents once again to the emergency room on 6/3 with chief complaint of shortness of breath chest x-ray showed recurrence of pleural effusion essentially occupying her entire left hemithorax pulmonary has been asked to evaluate for thoracentesis and also for consideration of Pleurx catheter.  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 She is s/p neoadjuvant chemotherapy consisting of doxorubicin and cyclophosphamide followed by by weekly Abraxane and carboplatin x12 starting 08/07/2018, stopped after 2 cycles (last dose on 10/15/2018) due to peripheral neuropathy also had adjunctive XRT.  Significant Hospital Events   6/4 admitted  Consults:  PCCM consulted   Procedures:  Left thoracentesis 6/4  Significant Diagnostic Tests:    Micro Data:    Antimicrobials:  Blood 6/3 >>   Interim history/subjective:   She feels a significant clinical benefit, less dyspnea, less cough since her thoracentesis, even though her chest x-ray post procedure still showed large scale left-sided opacification   Objective   Blood pressure 123/67, pulse 84, temperature 99 F (37.2 C), temperature source Oral, resp. rate 14, height 5\' 6"  (1.676 m), weight 80.4 kg, last menstrual period 11/14/2013, SpO2 94 %.        Intake/Output Summary (Last 24 hours) at 10/11/2019 0905 Last data filed at  10/11/2019 0600 Gross per 24 hour  Intake 1294.71 ml  Output --  Net 1294.71 ml   Filed Weights   10/09/19 1536  Weight: 80.4 kg    Examination: General: Laying in bed on her left side, comfortable HENT: Oropharynx clear, no stridor, pupils equal Lungs: Still with significantly diminished breath sounds on the left, clear on the right Cardiovascular: Gnerre, no murmur Abdomen: Soft, nondistended positive bowel sounds Extremities: No edema Neuro: Awake, oriented, interacting appropriately, nonfocal  Resolved Hospital Problem list     Assessment & Plan:   Dyspnea Metastatic right breast cancer Malignant left pleural effusion Diabetes Asthma  Discussion  Large left malignant pleural effusion.  She has had clinical benefit since her thoracentesis yesterday, but still with significant left-sided opacification.  Some of this could be persistent atelectasis but suspect she has large volume effusion still present. Plan to push pulmonary hygiene and follow chest x-ray.  If she has complete left-sided whiteout on 6/6 then we could consider CT chest.  Not sure that this will give [place] a lot of information prior to complete left pleural space drainage.  Probably should proceed with Pleurx catheter 1st and then image with CT to ensure adequate reinflation of her left lung, no postobstructive process.  Will discuss with Dr. Carlis Abbott, Dr. Tamala Julian to see if we can arrange for Pleurx either over the weekend or on Monday.  Continue her Dulera for asthma.  Albuterol available if she needs it.   Best practice:  Per primary   Labs   CBC: Recent Labs  Lab 10/09/19 1554 10/10/19 0512  WBC 8.7 9.8  NEUTROABS 6.5  --  HGB 12.8 11.8*  HCT 38.4 35.5*  MCV 88.1 89.0  PLT 463* 322    Basic Metabolic Panel: Recent Labs  Lab 10/09/19 1554 10/10/19 0512 10/11/19 0454  NA 136 137 136  K 3.6 4.3 3.9  CL 97* 99 103  CO2 26 25 25   GLUCOSE 223* 287* 196*  BUN 23* 33* 28*  CREATININE 1.12* 1.35*  0.94  CALCIUM 9.5 9.3 8.9   GFR: Estimated Creatinine Clearance: 70.6 mL/min (by C-G formula based on SCr of 0.94 mg/dL). Recent Labs  Lab 10/09/19 1554 10/09/19 1701 10/10/19 0512  WBC 8.7  --  9.8  LATICACIDVEN  --  1.7  --     Liver Function Tests: No results for input(s): AST, ALT, ALKPHOS, BILITOT, PROT, ALBUMIN in the last 168 hours. No results for input(s): LIPASE, AMYLASE in the last 168 hours. No results for input(s): AMMONIA in the last 168 hours.  ABG    Component Value Date/Time   HCO3 29.1 (H) 03/07/2016 1420   TCO2 31 03/07/2016 1420   O2SAT 44.0 03/07/2016 1420     Coagulation Profile: No results for input(s): INR, PROTIME in the last 168 hours.  Cardiac Enzymes: No results for input(s): CKTOTAL, CKMB, CKMBINDEX, TROPONINI in the last 168 hours.  HbA1C: Hgb A1c MFr Bld  Date/Time Value Ref Range Status  10/11/2019 04:54 AM 7.8 (H) 4.8 - 5.6 % Final    Comment:    (NOTE) Pre diabetes:          5.7%-6.4% Diabetes:              >6.4% Glycemic control for   <7.0% adults with diabetes   12/02/2018 10:42 AM 5.7 4.6 - 6.5 % Final    Comment:    Glycemic Control Guidelines for People with Diabetes:Non Diabetic:  <6%Goal of Therapy: <7%Additional Action Suggested:  >8%     CBG: Recent Labs  Lab 10/10/19 0755 10/10/19 1200 10/10/19 1739 10/10/19 2133 10/11/19 0748  GLUCAP 261* 221* 233* 273* 224*      Critical care time: NA    Baltazar Apo, MD, PhD 10/11/2019, 9:15 AM [facility] [phone] or if no answer [phone]

## 2019-10-11 NOTE — Progress Notes (Signed)
PROGRESS NOTE    Autumn Cook  QQP:[id number] DOB: Sep 04, 1960 DOA: 10/09/2019 PCP: Darreld Mclean, MD    Brief Narrative:  Patient admitted to the hospital with recurrent malignant left pleural effusion.  59 year old female who presented with dyspnea.  She does have a significant past medical history for type 2 diabetes mellitus, hypertension and metastatic breast cancer.  May 28 patient underwent thoracentesis to the left pleural effusion, fluid was positive for malignant cells.  Her dyspnea has been rapidly progressive, now dyspneic with minimal efforts.  It has been associated with pleuritic chest pain with cough. On her initial physical examination blood pressure 104/72, pulse rate 89, respiratory rate 21, temperature 98.5, oxygen saturation 98%.  Her lungs are clear to auscultation bilaterally, heart S1-S2, present rhythmic, soft abdomen, no lower extremity edema. Sodium 136, potassium 3.6, chloride 97, bicarb 26, glucose 223, BUN 23, creatinine 1.12, white count 8.7, hemoglobin 12.8, hematocrit 38.4, platelets 463.  SARS COVID-19 negative.  Chest radiograph with a large left pleural effusion with mediastinal shift to the right.  EKG 109 bpm, normal axis, normal intervals, sinus rhythm, no ST segment or T wave changes.  Patient has been placed on supplemental 02 per [place], prescribed analgesics and IV fluids.   Consulted Pulmonary for US guided thoracentesis, 1300 ml bloody fluids drained.   Plan for pleural catheter placement on this hospitalization.   Assessment & Plan:   Principal Problem:   Pleural effusion Active Problems:   Hypertension   Malignant neoplasm of overlapping sites of right breast in female, estrogen receptor positive ([place])   Type 2 diabetes mellitus with hyperglycemia ([place])    1. Acute hypoxic respiratory failure due to recurrent large malignant left pleural effusion/ in the setting of metastatic breast cancer. Sp thoracentesis, removed 1300 ml of bloody  fluids, improved dyspnea but not yet back to baseline, continue to use supplemental 02 per [place] 2 L/ min with oxygenation 94%. Follow up chest film post thoracentesis with persistent opacification of the entire left lung, noted some proximal air bronchogram suggesting atelectasis or infiltrate.   Continue pain control and supplemental 02 per [place], will add incentive spirometer q 4 H for airway clearing techniques. Out of bed to chair tid with meals.  Continue with as needed albuterol and dulera.   Patient will need a pleural catheter on this admission and likely follow chest CT post procedure to evaluate the lung parenchyma.   No signs of pneumonia, will hold on antibiotic therapy for now.   Follow up with pulmonary recommendations.   Patient at high risk for worsening respiratory failure.   2. AKI. Improved renal function with serum cr down to 0,94 with K at 3,9 and serum bicarbonate at 25.   Patient with poor oral intake will continue gentle hydration with IV fluids at 50 ml per H. Follow on renal panel in am.   3. T2DM. Fasting glucose this am 196, patient with poor oral intake, will continue glucose cover and monitoring with insulin sliding scale.   4. HTN. Blood pressure 123/67, last night down to 95/58. Will continue low dose atenolol and holding other blood pressure medications.   5. Metastatic breast cancer. Continue with anastrazole.   Continue close monitoring of blood pressure.   Status is: Inpatient  Remains inpatient appropriate because:Inpatient level of care appropriate due to severity of illness   Dispo: The patient is from: Home              Anticipated d/c is to:  Home              Anticipated d/c date is: 3 days              Patient currently is not medically stable to d/c.    DVT prophylaxis: Enoxaparin   Code Status:   full  Family Communication:  I spoke with patient's daughter at the bedside, we talked in detail about patient's condition, plan of care  and prognosis and all questions were addressed.     Consultants:   Oncology   Pulmonary    Subjective: Patient with improved dyspnea post thoracentesis but still not yet back to baseline, continue to have limited mobility due to dyspnea, no chest pain. Poor oral intake.   Objective: Vitals:   10/10/19 1952 10/10/19 2006 10/11/19 0552 10/11/19 0821  BP:  (!) 95/58 123/67   Pulse:  86 86 84  Resp:  14 20 14   Temp:  98.3 F (36.8 C) 99 F (37.2 C)   TempSrc:  Oral Oral   SpO2: (!) 88% 97% 94% 94%  Weight:      Height:        Intake/Output Summary (Last 24 hours) at 10/11/2019 1116 Last data filed at 10/11/2019 0600 Gross per 24 hour  Intake 1294.71 ml  Output --  Net 1294.71 ml   Filed Weights   10/09/19 1536  Weight: 80.4 kg    Examination:   General: Not in pain or dyspnea, deconditioned  Neurology: Awake and alert, non focal  E ENT: no pallor, no icterus, oral mucosa moist Cardiovascular: No JVD. S1-S2 present, rhythmic, no gallops, rubs, or murmurs. Trace lower extremity edema. Pulmonary: positive breath sounds bilaterally, decreased breath souds at the left base, no wheezing, rhonchi or rales. Gastrointestinal. Abdomen with no organomegaly, non tender, no rebound or guarding Skin. No rashes Musculoskeletal: no joint deformities     Data Reviewed: I have personally reviewed following labs and imaging studies  CBC: Recent Labs  Lab 10/09/19 1554 10/10/19 0512  WBC 8.7 9.8  NEUTROABS 6.5  --   HGB 12.8 11.8*  HCT 38.4 35.5*  MCV 88.1 89.0  PLT 463* 099   Basic Metabolic Panel: Recent Labs  Lab 10/09/19 1554 10/10/19 0512 10/11/19 0454  NA 136 137 136  K 3.6 4.3 3.9  CL 97* 99 103  CO2 26 25 25   GLUCOSE 223* 287* 196*  BUN 23* 33* 28*  CREATININE 1.12* 1.35* 0.94  CALCIUM 9.5 9.3 8.9   GFR: Estimated Creatinine Clearance: 70.6 mL/min (by C-G formula based on SCr of 0.94 mg/dL). Liver Function Tests: No results for input(s): AST, ALT,  ALKPHOS, BILITOT, PROT, ALBUMIN in the last 168 hours. No results for input(s): LIPASE, AMYLASE in the last 168 hours. No results for input(s): AMMONIA in the last 168 hours. Coagulation Profile: No results for input(s): INR, PROTIME in the last 168 hours. Cardiac Enzymes: No results for input(s): CKTOTAL, CKMB, CKMBINDEX, TROPONINI in the last 168 hours. BNP (last 3 results) No results for input(s): PROBNP in the last 8760 hours. HbA1C: Recent Labs    10/11/19 0454  HGBA1C 7.8*   CBG: Recent Labs  Lab 10/10/19 0755 10/10/19 1200 10/10/19 1739 10/10/19 2133 10/11/19 0748  GLUCAP 261* 221* 233* 273* 224*   Lipid Profile: No results for input(s): CHOL, HDL, LDLCALC, TRIG, CHOLHDL, LDLDIRECT in the last 72 hours. Thyroid Function Tests: No results for input(s): TSH, T4TOTAL, FREET4, T3FREE, THYROIDAB in the last 72 hours. Anemia Panel: No results  for input(s): VITAMINB12, FOLATE, FERRITIN, TIBC, IRON, RETICCTPCT in the last 72 hours.    Radiology Studies: I have reviewed all of the imaging during this hospital visit personally     Scheduled Meds: . anastrozole  1 mg Oral Daily  . atenolol  50 mg Oral Daily  . insulin aspart  0-9 Units Subcutaneous TID WC  . mometasone-formoterol  2 puff Inhalation BID   Continuous Infusions: . sodium chloride 75 mL/hr at 10/11/19 0207     LOS: 2 days        Kelda Azad Gerome Apley, MD

## 2019-10-12 LAB — CBC WITH DIFFERENTIAL/PLATELET
Abs Immature Granulocytes: 0.09 10*3/uL — ABNORMAL HIGH (ref 0.00–0.07)
Basophils Absolute: 0 10*3/uL (ref 0.0–0.1)
Basophils Relative: 0 %
Eosinophils Absolute: 0.1 10*3/uL (ref 0.0–0.5)
Eosinophils Relative: 1 %
HCT: 33 % — ABNORMAL LOW (ref 36.0–46.0)
Hemoglobin: 10.8 g/dL — ABNORMAL LOW (ref 12.0–15.0)
Immature Granulocytes: 1 %
Lymphocytes Relative: 9 %
Lymphs Abs: 0.9 10*3/uL (ref 0.7–4.0)
MCH: 29.1 pg (ref 26.0–34.0)
MCHC: 32.7 g/dL (ref 30.0–36.0)
MCV: 88.9 fL (ref 80.0–100.0)
Monocytes Absolute: 0.7 10*3/uL (ref 0.1–1.0)
Monocytes Relative: 8 %
Neutro Abs: 7.4 10*3/uL (ref 1.7–7.7)
Neutrophils Relative %: 81 %
Platelets: 327 10*3/uL (ref 150–400)
RBC: 3.71 MIL/uL — ABNORMAL LOW (ref 3.87–5.11)
RDW: 12.8 % (ref 11.5–15.5)
WBC: 9.2 10*3/uL (ref 4.0–10.5)
nRBC: 0 % (ref 0.0–0.2)

## 2019-10-12 LAB — BASIC METABOLIC PANEL
Anion gap: 13 (ref 5–15)
BUN: 20 mg/dL (ref 6–20)
CO2: 22 mmol/L (ref 22–32)
Calcium: 8.7 mg/dL — ABNORMAL LOW (ref 8.9–10.3)
Chloride: 103 mmol/L (ref 98–111)
Creatinine, Ser: 0.78 mg/dL (ref 0.44–1.00)
GFR calc Af Amer: >60 mL/min (ref >60)
GFR calc non Af Amer: >60 mL/min (ref >60)
Glucose, Bld: 236 mg/dL — ABNORMAL HIGH (ref 70–99)
Potassium: 4.2 mmol/L (ref 3.5–5.1)
Sodium: 138 mmol/L (ref 135–145)

## 2019-10-12 LAB — GLUCOSE, CAPILLARY
Glucose-Capillary: 220 mg/dL — ABNORMAL HIGH (ref 70–99)
Glucose-Capillary: 215 mg/dL — ABNORMAL HIGH (ref 70–99)
Glucose-Capillary: 213 mg/dL — ABNORMAL HIGH (ref 70–99)
Glucose-Capillary: 232 mg/dL — ABNORMAL HIGH (ref 70–99)

## 2019-10-12 MED ORDER — TRAZODONE HCL 50 MG PO TABS
50.0000 mg | ORAL_TABLET | Freq: Every day | ORAL | Status: DC
  Administered 2019-10-12 – 2019-10-13 (×2): 50 mg via ORAL
  Filled 2019-10-12 (×2): qty 1

## 2019-10-12 MED ORDER — INSULIN ASPART 100 UNIT/ML ~~LOC~~ SOLN
3.0000 [IU] | Freq: Three times a day (TID) | SUBCUTANEOUS | Status: DC
  Administered 2019-10-12 – 2019-10-14 (×5): 3 [IU] via SUBCUTANEOUS

## 2019-10-12 MED ORDER — GUAIFENESIN-DM 100-10 MG/5ML PO SYRP
5.0000 mL | ORAL_SOLUTION | ORAL | Status: DC | PRN
  Administered 2019-10-12: 5 mL via ORAL
  Filled 2019-10-12: qty 10

## 2019-10-12 NOTE — Progress Notes (Signed)
PROGRESS NOTE    CALISA LUCKENBAUGH  ZGY:[id number] DOB: [birth year] DOA: 10/09/2019 PCP: Darreld Mclean, MD    Brief Narrative:  Patient admitted to the hospital with recurrent malignant left pleural effusion.  59 year old female who presented with dyspnea. She does have a significant past medical history for type 2 diabetes mellitus, hypertension and metastatic breast cancer. May 28 patient underwent thoracentesis to the left pleural effusion, fluid was positive for malignant cells. Her dyspnea has been rapidly progressive, now dyspneic with minimal efforts. It has been associated with pleuritic chest pain with cough. On her initial physical examination blood pressure 104/72, pulse rate 89, respiratory rate 21, temperature 98.5, oxygen saturation 98%. Her lungs are clear to auscultation bilaterally,heart S1-S2, present rhythmic, soft abdomen, no lower extremity edema. Sodium 136, potassium 3.6, chloride 97, bicarb 26, glucose 223, BUN 23, creatinine 1.12, white count 8.7, hemoglobin 12.8, hematocrit 38.4, platelets 463. SARS COVID-19 negative. Chest radiograph with a large left pleural effusion with mediastinal shift to the right. EKG 109 bpm, normal axis, normal intervals, sinus rhythm, no ST segment or T wave changes.  Patient has been placed on supplemental 02 per [place], prescribed analgesics and IV fluids.   Consulted Pulmonary for US guided thoracentesis, 1300 ml bloody fluid drained.   Plan for pleural catheter placement on this hospitalization.    Assessment & Plan:   Principal Problem:   Pleural effusion Active Problems:   Hypertension   Malignant neoplasm of overlapping sites of right breast in female, estrogen receptor positive ([place])   Type 2 diabetes mellitus with hyperglycemia ([place])   1. Acute hypoxic respiratory failure due to recurrent large malignant left pleural effusion/ in the setting of metastatic breast cancer. Sp thoracentesis, removed 1300 ml of bloody  fluid. Follow up thoracentesis with persistent opacification of the entire left lung, noted some proximal air bronchogram suggesting atelectasis or infiltrate.   Fluid analysis consistent with exudate (malignancy), 3,118 nucleated cells, 46% lymphocytes.  Patient had fiver spine last night 38,8. Oxymetry this am 100% on 2 L/ min.   Pain control with morphine and oxycodone. Continue supplemental 02 per [place], and airway clearing technique with incentive spirometer q 4 H while awake, bronchodilator with dulera and as needed albuterol.  Plan for pleural catheter on this admission and chest CT post procedure to evaluate the lung parenchyma. Will continue to follow up cell count and temperature curve for now, holding on antibiotic therapy.   Patient at high risk for worsening respiratory failure.  2. AKI. Renal function with serum cr at 0,78 with K at 4,2 and serum bicarbonate at 22. Will hold on IV fluids for now.   3. Uncontrolled T2DM Hgb A1c 7,8. Fasting glucose this am 238, on glucose cover and monitoring with insulin sliding scale. patient used about 12 units over last 24 H, considering intermittent po intake, will add pre-meal insulin 3 units for now before starting long acting insulin.   4. HTN. No further hypotension, blood pressure this am with systolic 591 mmHg. Continue reduced dose of metoprolol.   5. Metastatic breast cancer. On anastrazole. Follow with Dr Jana Hakim as outpatient   Will add trazodone at night for sleep.   Status is: Inpatient  Remains inpatient appropriate because:Inpatient level of care appropriate due to severity of illness   Dispo: The patient is from: Home              Anticipated d/c is to: Home  Anticipated d/c date is: 2 days              Patient currently is not medically stable to d/c.   DVT prophylaxis: Enoxaparin   Code Status:   full  Family Communication:  I spoke with patient's husband at the bedside, we talked in detail about  patient's condition, plan of care and prognosis and all questions were addressed.     Consultants:   Pulmonary   Oncology   Procedures:   Left thoracentesis       Subjective: Patient continue to have dyspnea, moderate in intensity, worse with movement, no chest pain, no nausea or vomiting, had difficulty sleeping last night.   Objective: Vitals:   10/11/19 2042 10/12/19 0546 10/12/19 0834 10/12/19 1337  BP:  (!) 146/99  (!) 143/80  Pulse:  (!) 105  96  Resp:  15  16  Temp:  99 F (37.2 C)  98.9 F (37.2 C)  TempSrc:  Oral  Oral  SpO2: 100% 100% 97% 100%  Weight:      Height:        Intake/Output Summary (Last 24 hours) at 10/12/2019 1418 Last data filed at 10/12/2019 0600 Gross per 24 hour  Intake 1275.44 ml  Output --  Net 1275.44 ml   Filed Weights   10/09/19 1536  Weight: 80.4 kg    Examination:   General: Not in pain or dyspnea, deconditioned  Neurology: Awake and alert, non focal  E ENT: no pallor, no icterus, oral mucosa moist Cardiovascular: No JVD. S1-S2 present, rhythmic, no gallops, rubs, or murmurs. No lower extremity edema. Pulmonary: positive breath sounds bilaterally, decreased breath sounds on the left side with no wheezing, rhonchi or rales. Gastrointestinal. Abdomen with no organomegaly, non tender, no rebound or guarding Skin. No rashes Musculoskeletal: no joint deformities     Data Reviewed: I have personally reviewed following labs and imaging studies  CBC: Recent Labs  Lab 10/09/19 1554 10/10/19 0512 10/12/19 0459  WBC 8.7 9.8 9.2  NEUTROABS 6.5  --  7.4  HGB 12.8 11.8* 10.8*  HCT 38.4 35.5* 33.0*  MCV 88.1 89.0 88.9  PLT 463* 396 263   Basic Metabolic Panel: Recent Labs  Lab 10/09/19 1554 10/10/19 0512 10/11/19 0454 10/12/19 0459  NA 136 137 136 138  K 3.6 4.3 3.9 4.2  CL 97* 99 103 103  CO2 26 25 25 22   GLUCOSE 223* 287* 196* 236*  BUN 23* 33* 28* 20  CREATININE 1.12* 1.35* 0.94 0.78  CALCIUM 9.5 9.3 8.9 8.7*    GFR: Estimated Creatinine Clearance: 82.9 mL/min (by C-G formula based on SCr of 0.78 mg/dL). Liver Function Tests: No results for input(s): AST, ALT, ALKPHOS, BILITOT, PROT, ALBUMIN in the last 168 hours. No results for input(s): LIPASE, AMYLASE in the last 168 hours. No results for input(s): AMMONIA in the last 168 hours. Coagulation Profile: No results for input(s): INR, PROTIME in the last 168 hours. Cardiac Enzymes: No results for input(s): CKTOTAL, CKMB, CKMBINDEX, TROPONINI in the last 168 hours. BNP (last 3 results) No results for input(s): PROBNP in the last 8760 hours. HbA1C: Recent Labs    10/11/19 0454  HGBA1C 7.8*   CBG: Recent Labs  Lab 10/11/19 1135 10/11/19 1628 10/11/19 2136 10/12/19 0748 10/12/19 1314  GLUCAP 221* 224* 183* 220* 215*   Lipid Profile: No results for input(s): CHOL, HDL, LDLCALC, TRIG, CHOLHDL, LDLDIRECT in the last 72 hours. Thyroid Function Tests: No results for input(s): TSH, T4TOTAL, FREET4,  T3FREE, THYROIDAB in the last 72 hours. Anemia Panel: No results for input(s): VITAMINB12, FOLATE, FERRITIN, TIBC, IRON, RETICCTPCT in the last 72 hours.    Radiology Studies: I have reviewed all of the imaging during this hospital visit personally     Scheduled Meds: . anastrozole  1 mg Oral Daily  . atenolol  50 mg Oral Daily  . insulin aspart  0-9 Units Subcutaneous TID WC  . mometasone-formoterol  2 puff Inhalation BID   Continuous Infusions: . sodium chloride 50 mL/hr at 10/12/19 0906     LOS: 3 days        Obi Scrima Gerome Apley, MD

## 2019-10-13 ENCOUNTER — Inpatient Hospital Stay (HOSPITAL_COMMUNITY): Payer: 59

## 2019-10-13 LAB — GLUCOSE, CAPILLARY
Glucose-Capillary: 284 mg/dL — ABNORMAL HIGH (ref 70–99)
Glucose-Capillary: 251 mg/dL — ABNORMAL HIGH (ref 70–99)
Glucose-Capillary: 186 mg/dL — ABNORMAL HIGH (ref 70–99)
Glucose-Capillary: 132 mg/dL — ABNORMAL HIGH (ref 70–99)

## 2019-10-13 LAB — BASIC METABOLIC PANEL
Anion gap: 14 (ref 5–15)
BUN: 17 mg/dL (ref 6–20)
CO2: 21 mmol/L — ABNORMAL LOW (ref 22–32)
Calcium: 9.4 mg/dL (ref 8.9–10.3)
Chloride: 102 mmol/L (ref 98–111)
Creatinine, Ser: 0.72 mg/dL (ref 0.44–1.00)
GFR calc Af Amer: >60 mL/min (ref >60)
GFR calc non Af Amer: >60 mL/min (ref >60)
Glucose, Bld: 291 mg/dL — ABNORMAL HIGH (ref 70–99)
Potassium: 4.3 mmol/L (ref 3.5–5.1)
Sodium: 137 mmol/L (ref 135–145)

## 2019-10-13 LAB — MRSA PCR SCREENING: MRSA by PCR: NEGATIVE

## 2019-10-13 LAB — CBC WITH DIFFERENTIAL/PLATELET
Abs Immature Granulocytes: 0.1 10*3/uL — ABNORMAL HIGH (ref 0.00–0.07)
Basophils Absolute: 0 10*3/uL (ref 0.0–0.1)
Basophils Relative: 0 %
Eosinophils Absolute: 0 10*3/uL (ref 0.0–0.5)
Eosinophils Relative: 0 %
HCT: 34.6 % — ABNORMAL LOW (ref 36.0–46.0)
Hemoglobin: 11.4 g/dL — ABNORMAL LOW (ref 12.0–15.0)
Immature Granulocytes: 1 %
Lymphocytes Relative: 9 %
Lymphs Abs: 1 10*3/uL (ref 0.7–4.0)
MCH: 29.6 pg (ref 26.0–34.0)
MCHC: 32.9 g/dL (ref 30.0–36.0)
MCV: 89.9 fL (ref 80.0–100.0)
Monocytes Absolute: 0.8 10*3/uL (ref 0.1–1.0)
Monocytes Relative: 7 %
Neutro Abs: 8.9 10*3/uL — ABNORMAL HIGH (ref 1.7–7.7)
Neutrophils Relative %: 83 %
Platelets: 401 10*3/uL — ABNORMAL HIGH (ref 150–400)
RBC: 3.85 MIL/uL — ABNORMAL LOW (ref 3.87–5.11)
RDW: 13.1 % (ref 11.5–15.5)
WBC: 10.9 10*3/uL — ABNORMAL HIGH (ref 4.0–10.5)
nRBC: 0 % (ref 0.0–0.2)

## 2019-10-13 MED ORDER — TRAMADOL HCL 50 MG PO TABS: 50.0000 mg | ORAL_TABLET | Freq: Four times a day (QID) | ORAL | Status: DC | PRN

## 2019-10-13 MED ORDER — ORAL CARE MOUTH RINSE
15.0000 mL | Freq: Two times a day (BID) | OROMUCOSAL | Status: DC
  Administered 2019-10-13 (×2): 15 mL via OROMUCOSAL

## 2019-10-13 MED ORDER — ACETAMINOPHEN 500 MG PO TABS
500.0000 mg | ORAL_TABLET | Freq: Three times a day (TID) | ORAL | Status: DC
  Administered 2019-10-13 – 2019-10-14 (×3): 500 mg via ORAL
  Filled 2019-10-13 (×3): qty 1

## 2019-10-13 MED ORDER — INSULIN GLARGINE 100 UNIT/ML ~~LOC~~ SOLN
5.0000 [IU] | Freq: Every day | SUBCUTANEOUS | Status: DC
  Administered 2019-10-13 – 2019-10-14 (×2): 5 [IU] via SUBCUTANEOUS
  Filled 2019-10-13 (×2): qty 0.05

## 2019-10-13 MED ORDER — CHLORHEXIDINE GLUCONATE CLOTH 2 % EX PADS
6.0000 | MEDICATED_PAD | Freq: Every day | CUTANEOUS | Status: DC
  Administered 2019-10-13 – 2019-10-14 (×2): 6 via TOPICAL

## 2019-10-13 MED ORDER — IBUPROFEN 200 MG PO TABS
200.0000 mg | ORAL_TABLET | Freq: Three times a day (TID) | ORAL | Status: DC
  Administered 2019-10-13 – 2019-10-14 (×2): 200 mg via ORAL
  Filled 2019-10-13 (×2): qty 1

## 2019-10-13 NOTE — Progress Notes (Addendum)
PROGRESS NOTE    Autumn Cook  LTR:[id number] DOB: [birth year] DOA: 10/09/2019 PCP: Darreld Mclean, MD    Brief Narrative:  Patient admitted to the hospital with recurrent malignant left pleural effusion.  59 year old female who presented with dyspnea. She does have a significant past medical history for type 2 diabetes mellitus, hypertension and metastatic breast cancer. May 28 patient underwent thoracentesis to the left pleural effusion, fluid was positive for malignant cells. Her dyspnea has been rapidly progressive, now dyspneic with minimal efforts. It has been associated with pleuritic chest pain with cough. On her initial physical examination blood pressure 104/72, pulse rate 89, respiratory rate 21, temperature 98.5, oxygen saturation 98%. Her lungs are clear to auscultation bilaterally,heart S1-S2, present rhythmic, soft abdomen, no lower extremity edema. Sodium 136, potassium 3.6, chloride 97, bicarb 26, glucose 223, BUN 23, creatinine 1.12, white count 8.7, hemoglobin 12.8, hematocrit 38.4, platelets 463. SARS COVID-19 negative. Chest radiograph with a large left pleural effusion with mediastinal shift to the right. EKG 109 bpm, normal axis, normal intervals, sinus rhythm, no ST segment or T wave changes.  Patient has been placed on supplemental 02 per [place], prescribed analgesics and IV fluids.   ConsultedPulmonary for US guided thoracentesis, 1300 ml bloody fluid drained.  Today patient had a tunneled pleural drain catheter inserted and placed to suction with good toleration.    Assessment & Plan:   Principal Problem:   Pleural effusion Active Problems:   Hypertension   Malignant neoplasm of overlapping sites of right breast in female, estrogen receptor positive ([place])   Type 2 diabetes mellitus with hyperglycemia ([place])    1. Acute hypoxic respiratory failure due to recurrent large malignant left pleural effusion/ in the setting of metastatic breast  cancer.Sp thoracentesis, removed 1300 ml of bloody fluid.  Fluid analysis consistent with exudate (malignancy), 3,118 nucleated cells, 46% lymphocytes.   Today patient underwent insertion of tunneled pleural drain catheter and placed to suction, 1000 ml of serosanguinous fluid obtained. Follow up chest film personally reviewed with improved air of the left upper lobe, persistent effusion no pneumothorax.  Will continue pain control with ibuprofen 200 mg plus acetaminophen 500 mg TID, with tramadol for break through pain. She will follow up with [name] from oncology for further cancer therapy. Continue antitussive agents and incentive spirometer.   Patient continue at high risk for worsening respiratory failure.   2. AKI.Renal function continue to be stable, will continue close follow up. Patient with poor oral intake.  3. Uncontrolled T2DM Hgb A1c 7,8. Fasting glucose is 291 with capillary 284 and 251, will continue insulin sliding scale for glucose cover and monitoring. Continue premeal insulin 3 units and will add 5 units of basal insulin for now.   4. HTN.Continue low dose of atenolol for blood pressure control.   5. Metastatic breast cancer. Follow with Dr Jana Hakim as outpatient for further cancer therapy, she has progressive disease.   Continue with trazodone at night for sleep.   6. COPD. No signs of exacerbation, continue with dulera and as needed albuterol.   *recheck blood pressure with normal ranges in her upper extremities.   Status is: Inpatient  Remains inpatient appropriate because:Inpatient level of care appropriate due to severity of illness   Dispo: The patient is from: Home              Anticipated d/c is to: Home              Anticipated d/c date is: 2  days              Patient currently is not medically stable to d/c.   DVT prophylaxis: Enoxaparin   Code Status:   full  Family Communication:  I spoke with patient's daughter at the bedside,  we talked in detail about patient's condition, plan of care and prognosis and all questions were addressed.     Consultants:   Pulmonary  Oncology   Procedures:   Left thoracentesis and pleural catheter      Subjective: Patient continue to have left chest pain at the site of the pleural catheter, no nausea or vomiting. Her dyspnea has been stable.   Objective: Vitals:   10/13/19 1200 10/13/19 1300 10/13/19 1400 10/13/19 1409  BP: (!) 192/89 (!) 226/98 (!) 233/90 126/72  Pulse: 87 89  (!) 104  Resp: (!) 22 (!) 24 18 (!) 25  Temp: 98.6 F (37 C)     TempSrc: Oral     SpO2: 100% 98%  99%  Weight:      Height:        Intake/Output Summary (Last 24 hours) at 10/13/2019 1529 Last data filed at 10/13/2019 1200 Gross per 24 hour  Intake 1010 ml  Output --  Net 1010 ml   Filed Weights   10/09/19 1536  Weight: 80.4 kg    Examination:   General: Not in pain or dyspnea. Deconditioned  Neurology: Awake and alert, non focal  E ENT: no pallor, no icterus, oral mucosa moist Cardiovascular: No JVD. S1-S2 present, rhythmic, no gallops, rubs, or murmurs. No lower extremity edema. Pulmonary: positive  breath sounds bilaterally, decreased breath sounds at the left base, with no wheezing, rhonchi or rales. Gastrointestinal. Abdomen with no organomegaly, non tender, no rebound or guarding Skin. No rashes Musculoskeletal: no joint deformities     Data Reviewed: I have personally reviewed following labs and imaging studies  CBC: Recent Labs  Lab 10/09/19 1554 10/10/19 0512 10/12/19 0459 10/13/19 0600  WBC 8.7 9.8 9.2 10.9*  NEUTROABS 6.5  --  7.4 8.9*  HGB 12.8 11.8* 10.8* 11.4*  HCT 38.4 35.5* 33.0* 34.6*  MCV 88.1 89.0 88.9 89.9  PLT 463* 396 327 532*   Basic Metabolic Panel: Recent Labs  Lab 10/09/19 1554 10/10/19 0512 10/11/19 0454 10/12/19 0459 10/13/19 0600  NA 136 137 136 138 137  K 3.6 4.3 3.9 4.2 4.3  CL 97* 99 103 103 102  CO2 26 25 25 22  21*   GLUCOSE 223* 287* 196* 236* 291*  BUN 23* 33* 28* 20 17  CREATININE 1.12* 1.35* 0.94 0.78 0.72  CALCIUM 9.5 9.3 8.9 8.7* 9.4   GFR: Estimated Creatinine Clearance: 82.9 mL/min (by C-G formula based on SCr of 0.72 mg/dL). Liver Function Tests: No results for input(s): AST, ALT, ALKPHOS, BILITOT, PROT, ALBUMIN in the last 168 hours. No results for input(s): LIPASE, AMYLASE in the last 168 hours. No results for input(s): AMMONIA in the last 168 hours. Coagulation Profile: No results for input(s): INR, PROTIME in the last 168 hours. Cardiac Enzymes: No results for input(s): CKTOTAL, CKMB, CKMBINDEX, TROPONINI in the last 168 hours. BNP (last 3 results) No results for input(s): PROBNP in the last 8760 hours. HbA1C: Recent Labs    10/11/19 0454  HGBA1C 7.8*   CBG: Recent Labs  Lab 10/12/19 1314 10/12/19 1701 10/12/19 2042 10/13/19 0817 10/13/19 1214  GLUCAP 215* 213* 232* 284* 251*   Lipid Profile: No results for input(s): CHOL, HDL, LDLCALC, TRIG, CHOLHDL,  LDLDIRECT in the last 72 hours. Thyroid Function Tests: No results for input(s): TSH, T4TOTAL, FREET4, T3FREE, THYROIDAB in the last 72 hours. Anemia Panel: No results for input(s): VITAMINB12, FOLATE, FERRITIN, TIBC, IRON, RETICCTPCT in the last 72 hours.    Radiology Studies: I have reviewed all of the imaging during this hospital visit personally     Scheduled Meds: . atenolol  50 mg Oral Daily  . Chlorhexidine Gluconate Cloth  6 each Topical Daily  . insulin aspart  0-9 Units Subcutaneous TID WC  . insulin aspart  3 Units Subcutaneous TID WC  . mouth rinse  15 mL Mouth Rinse BID  . mometasone-formoterol  2 puff Inhalation BID  . traZODone  50 mg Oral QHS   Continuous Infusions:   LOS: 4 days        Reyah Streeter Gerome Apley, MD

## 2019-10-13 NOTE — H&P (Signed)
[facility] PCCM H&P  History of present illness: This is a pleasant 59 year old female who has a diagnosis of breast cancer who presented to our facility with shortness of breath and chest pressure on the left.  She had a thoracentesis performed because a chest x-ray showed a large left pleural effusion.  This confirmed malignant pleural effusion.  She had reaccumulation of the fluid.  She remains hospitalized on a small amount of oxygen.  She is to have a pleural drainage catheter inserted today.  Past Medical History:  Diagnosis Date   Allergy    Arthritis    Asthma    Cancer ([place])    R breast cancer   Diabetes mellitus without complication ([place])    no meds now   Family history of adverse reaction to anesthesia    "entire family takes a long time to wake up- N&V for several days"   Family history of lung cancer    Hypertension    Migraine    PONV (postoperative nausea and vomiting)      Family History  Problem Relation Age of Onset   Hypertension Father    Cancer Father 42       lung cancer   Stroke Father    Heart disease Mother        arrhythmia   Hypertension Mother    Kidney failure Sister      Social History   Socioeconomic History   Marital status: Married    Spouse name: Not on file   Number of children: Not on file   Years of education: Not on file   Highest education level: Not on file  Occupational History   Occupation: real estate agent    Employer: [employer]  Tobacco Use   Smoking status: Former Smoker    Packs/day: 1.00    Years: 5.00    Pack years: 5.00    Quit date: 05/08/1988    Years since quitting: 31.4   Smokeless tobacco: Never Used  Substance and Sexual Activity   Alcohol use: Yes    Comment: social   Drug use: No   Sexual activity: Not on file  Other Topics Concern   Not on file  Social History Narrative   married   Social Determinants of Health   Financial Resource Strain:    Difficulty of Paying  Living Expenses:   Food Insecurity:    Worried About Charity fundraiser in the Last Year:    Arboriculturist in the Last Year:   Transportation Needs:    Film/video editor (Medical):    Lack of Transportation (Non-Medical):   Physical Activity:    Days of Exercise per Week:    Minutes of Exercise per Session:   Stress:    Feeling of Stress :   Social Connections:    Frequency of Communication with Friends and Family:    Frequency of Social Gatherings with Friends and Family:    Attends Religious Services:    Active Member of Clubs or Organizations:    Attends Archivist Meetings:    Marital Status:   Intimate Partner Violence:    Fear of Current or Ex-Partner:    Emotionally Abused:    Physically Abused:    Sexually Abused:      Allergies  Allergen Reactions   Penicillins Hives    Did it involve swelling of the face/tongue/throat, SOB, or low BP? Yes--syncope episode Did it involve sudden or severe rash/hives, skin  peeling, or any reaction on the inside of your mouth or nose? No Did you need to seek medical attention at a hospital or doctor's office? Yes When did it last happen? 37-38 YEARS AGO If all above answers are "NO", may proceed with cephalosporin use.    Metformin And Related Diarrhea   Adhesive [Tape] Other (See Comments)    "severe bruising"  ALSO SKIN GLUE   Asa [Aspirin] Other (See Comments)    GI upset    Mango Flavor Hives   Olive Tree Hives    Pt tolerates olive oil     @encmedstart @ Vitals:   10/13/19 0746 10/13/19 0824 10/13/19 0825 10/13/19 0932  BP: (!) 164/95  (!) 153/88 (!) 175/102  Pulse: 98  (!) 105 (!) 118  Resp:    (!) 26  Temp:      TempSrc:      SpO2:  98%  99%  Weight:      Height:       General:  Resting comfortably in bed HENT: NCAT OP clear PULM: diminished on left B, normal effort CV: RRR, no mgr GI: BS+, soft, nontender MSK: normal bulk and tone Neuro: awake, alert, no  distress, MAEW  Chest x-ray images independently reviewed showing a large left pleural effusion  Impression: Breast cancer Malignant pleural effusion  Plan: Pleurx catheter insertion today, patient was informed of the risk and benefits and gave consent  Roselie Awkward, MD [facility] Pager: [phone] Cell: [phone] If no response, call [phone]

## 2019-10-13 NOTE — Op Note (Addendum)
Tunneled Pleural Drainage Catheter Insertion Procedure Note  Indications:  Clinically significant Malignant Pleural Effusion  Pre-operative Diagnosis: Malignant Pleural Effusion  Post-operative Diagnosis: Malignant Pleural Effusion  Procedure Details  The procedure was performed with Dr. June Leap.  Informed consent was obtained for the procedure, including sedation.  Risks of lung perforation, hemorrhage, arrhythmia, and adverse drug reaction were discussed.   Ultrasound was used to identify the best location for tube insertion.  After sterile skin prep, using standard technique, a 12 French tube was placed in the left lateral 7th rib space.  Lidocaine was used to numb the skin over the left lateral chest wall in two separate locations 5 cm apart, then the subcutaneous space between those was numbed as well.  Using a standard technique, a needle was passed over the 8th rib into the pleural space, then a wire was passed.  Two small incisions were made 5 cm apart and using blunt dissection the pleural drainage catheter was tunneled under the skin from the anterior incision to the posterior incision.  The cuff was placed 1cm from the anterior incision.  Then the dilator was placed into the pleural space with the access port.  The catheter was placed into the left pleural fluid through the access port and then the port was removed.    Findings: 1000 ml of serosanguinous fluid obtained  Estimated Blood Loss:  Minimal         Specimens:  None              Complications:  None; patient tolerated the procedure well.         Disposition: ICU, may return to telemetry floor         Condition: stable  Attending Attestation: I performed the procedure.  Roselie Awkward, MD [facility] Pager: [phone] Cell: [phone] If no response, call [phone]

## 2019-10-13 NOTE — Plan of Care (Signed)

## 2019-10-13 NOTE — Telephone Encounter (Signed)
Oral Oncology Patient Advocate Encounter  Prior Authorization for Xeloda has been approved.    PA# [id number] Effective dates: 6/4/821 through 10/09/20  Patient must fill at [facility] will continue to follow.   [place] [facility] [place] Phone [phone] Fax [phone] 10/13/2019 8:30 AM

## 2019-10-13 NOTE — Progress Notes (Signed)
Autumn Cook   DOB:[birth year]   ZY#:[id number]   UXN#:[id number]  Subjective:  Autumn Cook continues to have SOB and cough. She tells me the O2 is helping. Pain is moderately well-controlled but she feels the pain medicine is "keeping her down." She would like to be able to ambulate more. She tells me pleurx placement is planned for today. No family in room  Objective: african [place] woman examined in bed Vitals:   10/12/19 2026 10/13/19 0547  BP:  (!) 176/109  Pulse:  (!) 105  Resp:  16  Temp:  98.6 F (37 C)  SpO2: 100% 100%    Body mass index is 28.62 kg/m.  Intake/Output Summary (Last 24 hours) at 10/13/2019 0742 Last data filed at 10/12/2019 1815 Gross per 24 hour  Intake 360 ml  Output --  Net 360 ml     Lungs no wheezes--auscultated anterolaterally  Heart regular rate and rhythm  Abdomen soft, +BS  Neuro nonfocal; she is A&O x3, very clear-headed about her situation  Breast exam: deferred  CBG (last 3)  Recent Labs    10/12/19 1314 10/12/19 1701 10/12/19 2042  GLUCAP 215* 213* 232*     Labs:  Lab Results  Component Value Date   WBC 10.9 (H) 10/13/2019   HGB 11.4 (L) 10/13/2019   HCT 34.6 (L) 10/13/2019   MCV 89.9 10/13/2019   PLT 401 (H) 10/13/2019   NEUTROABS 8.9 (H) 10/13/2019    _0 @  Urine Studies No results for input(s): UHGB, CRYS in the last 72 hours.  Invalid input(s): UACOL, UAPR, USPG, UPH, UTP, UGL, [place], UBIL, UNIT, UROB, [place], UEPI, UWBC, Junie Panning [place], [place], [place]  Basic Metabolic Panel: Recent Labs  Lab 10/09/19 1554 10/09/19 1554 10/10/19 0512 10/10/19 0512 10/11/19 0454 10/11/19 0454 10/12/19 0459 10/13/19 0600  NA 136  --  137  --  136  --  138 137  K 3.6   < > 4.3   < > 3.9   < > 4.2 4.3  CL 97*  --  99  --  103  --  103 102  CO2 26  --  25  --  25  --  22 21*  GLUCOSE 223*  --  287*  --  196*  --  236* 291*  BUN 23*  --  33*  --  28*  --  20 17  CREATININE 1.12*  --  1.35*  --  0.94  --  0.78 0.72  CALCIUM  9.5  --  9.3  --  8.9  --  8.7* 9.4   < > = values in this interval not displayed.   GFR Estimated Creatinine Clearance: 82.9 mL/min (by C-G formula based on SCr of 0.72 mg/dL). Liver Function Tests: No results for input(s): AST, ALT, ALKPHOS, BILITOT, PROT, ALBUMIN in the last 168 hours. No results for input(s): LIPASE, AMYLASE in the last 168 hours. No results for input(s): AMMONIA in the last 168 hours. Coagulation profile No results for input(s): INR, PROTIME in the last 168 hours.  CBC: Recent Labs  Lab 10/09/19 1554 10/10/19 0512 10/12/19 0459 10/13/19 0600  WBC 8.7 9.8 9.2 10.9*  NEUTROABS 6.5  --  7.4 8.9*  HGB 12.8 11.8* 10.8* 11.4*  HCT 38.4 35.5* 33.0* 34.6*  MCV 88.1 89.0 88.9 89.9  PLT 463* 396 327 401*   Cardiac Enzymes: No results for input(s): CKTOTAL, CKMB, CKMBINDEX, TROPONINI in the last 168 hours. BNP: Invalid input(s): POCBNP CBG: Recent Labs  Lab 10/11/19  2136 10/12/19 0748 10/12/19 1314 10/12/19 1701 10/12/19 2042  GLUCAP 183* 220* 215* 213* 232*   D-Dimer No results for input(s): DDIMER in the last 72 hours. Hgb A1c Recent Labs    10/11/19 0454  HGBA1C 7.8*   Lipid Profile No results for input(s): CHOL, HDL, LDLCALC, TRIG, CHOLHDL, LDLDIRECT in the last 72 hours. Thyroid function studies No results for input(s): TSH, T4TOTAL, T3FREE, THYROIDAB in the last 72 hours.  Invalid input(s): FREET3 Anemia work up No results for input(s): VITAMINB12, FOLATE, FERRITIN, TIBC, IRON, RETICCTPCT in the last 72 hours. Microbiology Recent Results (from the past 240 hour(s))  Body fluid culture     Status: None   Collection Time: 10/03/19 12:10 PM   Specimen: PATH Cytology Pleural fluid  Result Value Ref Range Status   Specimen Description   Final    CYTO PLEU Performed at [facility] [address], [place], [place] [address]    Special Requests   Final    NONE Performed at [facility], [place]  [address] [place], [place] [address]    Gram Stain NO WBC SEEN NO ORGANISMS SEEN   Final   Culture   Final    NO GROWTH 3 DAYS Performed at [facility], [place] [address], [place], [place] [address]    Report Status 10/06/2019 FINAL  Final  Culture, blood (routine x 2)     Status: None (Preliminary result)   Collection Time: 10/09/19  5:02 PM   Specimen: BLOOD  Result Value Ref Range Status   Specimen Description   Final    BLOOD LEFT ANTECUBITAL Performed at [facility], [address] [place], [place] [address]    Special Requests   Final    BOTTLES DRAWN AEROBIC AND ANAEROBIC Blood Culture adequate volume Performed at [facility], [address], [place], [place] [address]    Culture   Final    NO GROWTH 4 DAYS Performed at [facility], [place] [address] [place], [place] [address]    Report Status PENDING  Incomplete  Culture, blood (routine x 2)     Status: None (Preliminary result)   Collection Time: 10/09/19  5:04 PM   Specimen: BLOOD  Result Value Ref Range Status   Specimen Description   Final    BLOOD BLOOD LEFT FOREARM Performed at [facility], [address] [place], [place] [address]    Special Requests   Final    BOTTLES DRAWN AEROBIC ONLY Blood Culture results may not be optimal due to an inadequate volume of blood received in culture bottles Performed at [facility], [address] [place], [place] [address]    Culture   Final    NO GROWTH 4 DAYS Performed at [facility], [place] [address] [place],  [address]    Report Status PENDING  Incomplete  SARS Coronavirus 2 by RT PCR (hospital order, performed in [facility] hospital lab) Nasopharyngeal Nasopharyngeal Swab     Status: None   Collection Time: 10/09/19  5:29 PM   Specimen: Nasopharyngeal Swab  Result Value Ref Range Status   SARS Coronavirus 2 NEGATIVE NEGATIVE Final    Comment: (NOTE) SARS-CoV-2 target nucleic acids are NOT  DETECTED. The SARS-CoV-2 RNA is generally detectable in upper and lower respiratory specimens during the acute phase of infection. The lowest concentration of SARS-CoV-2 viral copies this assay can detect is  250 copies / mL. A negative result does not preclude SARS-CoV-2 infection and should not be used as the sole basis for treatment or other patient management decisions.  A negative result may occur with improper specimen collection / handling, submission of specimen other than nasopharyngeal swab, presence of viral mutation(s) within the areas targeted by this assay, and inadequate number of viral copies (<250 copies / mL). A negative result must be combined with clinical observations, patient history, and epidemiological information. Fact Sheet for Patients:   StrictlyIdeas.no Fact Sheet for Healthcare Providers: BankingDealers.co.za This test is not yet approved or cleared  by the [place] FDA and has been authorized for detection and/or diagnosis of SARS-CoV-2 by FDA under an Emergency Use Authorization (EUA).  This EUA will remain in effect (meaning this test can be used) for the duration of the COVID-19 declaration under Section 564(b)(1) of the Act, 21 U.S.C. section 360bbb-3(b)(1), unless the authorization is terminated or revoked sooner. Performed at [facility], [address] [place], [place] [address]       Studies:  No results found.  Assessment: 59 y.o. [place], [place] woman status post right breast biopsy x2 on 07/10/2018 for multicentric invasive ductal carcinoma, clinically T1c N1, stage IIB, grade 3, functionally triple negative, with an MIB-1 of 70%             (a) right axillary lymph node biopsied at the same time was positive  (1) neoadjuvant chemotherapy consisting of doxorubicin and cyclophosphamide in dose dense fashion x4 started 08/07/2018, completed 09/16/2018, followed by weekly Abraxane  and carboplatin x12 starting 08/07/2018, stopped after 2 cycles (last dose on 10/15/2018) due to peripheral neuropathy   (2) Right breast lumpectomy on 12/27/2018 shows a ypT1b ypN0 residual invasive ductal carcinoma, margins negative.             (a) 5 sentinel nodes were negative.              (b) Estrogen receptor 75% positive, weak, Progesterone receptor negative.  (3) adjuvant radiation  Radiation Treatment Dates: 02/18/2019 through 04/02/2019 Site Technique Total Dose (Gy) Dose per Fx (Gy) Completed Fx Beam Energies  Breast: Breast_Rt 3D 50.4/50.4 1.8 28/28 6X, 10X  Breast: Breast_Rt_axilla 3D 50.4/50.4 1.8 28/28 6X, 10X  Breast: Breast_Rt_Bst 3D 20/20 2 10/10 6X, 10X    (4) genetics testing 08/06/2018 through the Breast + GYN Cancers Panel offered by [facility] found no deleterious mutations in ATM, BARD1, BRCA1, BRCA2, BRIP1, CDH1, CHEK2, DICER1, EPCAM, MLH1, MSH2, MSH6, NBN, NF1, PALB2, PMS2, PTEN, RAD50, RAD51C, RAD51D, SMARCA4, STK11, TP53.   (5) anastrozole started 05/14/2018        (a) bone density 09/22/2019 shows a T score of -0.8 (normal)  (b) anastrozole discontinued June 2021 with disease progression  METASTATIC DISEASE: June 2021 (6) CT renal stone study 09/30/2019 to evaluate left flank pain shows left pleural effusion and possible liver lesions             (a) cytology from the left pleural effusion 10/03/2019 confirms metastatic disease             (b) liver MRI 10/04/2019 shows multiple liver lesions        (c) prognostic panel requested 10/10/2019 on cytology from 10/03/2019 fluid  (7) left effusion: pleurx placement pending  Plan:  I have stopped anastrozole given evidence of disease progression. A prognostic panel is pending on cytology from 05/28; I expect that to confirm triple-negative disease. Plan is to start capecitabine  on an outpatient basis once she is stable.  Note that patient is separated from her husband, who however has been visiting. I offered  to [place] a "limited visitation" order but she "wants to keep things peaceful." She has largely filled out the [facility] form and intends to name her daughter Autumn Cook as Chauncey Reading-- I will ask the Chaplain Service to help the patient get this notarized this admission  Once pleurx in place would consider switching to aleve 220 + tylenol 500 TID for pain control with tramadol PRN, which may be her outpatient pain management.  Will follow with you.  Chauncey Cruel, MD 10/13/2019  7:42 AM Medical Oncology and Hematology [facility] [address] [place], [place] [address] Tel. [phone]    Fax. [phone]

## 2019-10-13 NOTE — Progress Notes (Signed)
   10/13/19 1200  Clinical Encounter Type  Visited With Patient and family together  Visit Type Initial;Psychological support;Spiritual support;Critical Care  Referral From Nurse  Consult/Referral To [place] (Comment) (Advance Directive Education )  Stress Factors  Patient Stress Factors Family relationships;Health changes  Family Stress Factors Family relationships;Health changes  Advance Directives (For Healthcare)  Does Patient Have a Medical Advance Directive? No  Would patient like information on creating a medical advance directive? Yes (Inpatient - patient requests chaplain consult to create a medical advance directive)   I visited with Dewaine Oats per spiritual care consult. Bellami has been separated from her spouse for approximately 4 months now and does not want him making any of her healthcare decisions for her in the event that she is unable to, or knowing about her healthcare information.  Monda wants her daughter [name] and son Rayvon Char. [place] to be the only ones at this time to know her healthcare information. She requested to set up a password, and I notified the nurse of this.  Deardra is going to fill out the Advance Directive paperwork naming her children as her Healthcare agents and the have [place] paged to complete.   Chaplain Shanon Ace M.Div., [facility]

## 2019-10-14 ENCOUNTER — Telehealth: Payer: Self-pay | Admitting: Pharmacist

## 2019-10-14 ENCOUNTER — Inpatient Hospital Stay (HOSPITAL_COMMUNITY): Payer: 59

## 2019-10-14 DIAGNOSIS — C50811 Malignant neoplasm of overlapping sites of right female breast: Secondary | ICD-10-CM

## 2019-10-14 DIAGNOSIS — Z170 Estrogen receptor positive status [ER+]: Secondary | ICD-10-CM

## 2019-10-14 LAB — CBC WITH DIFFERENTIAL/PLATELET
Abs Immature Granulocytes: 0.08 10*3/uL — ABNORMAL HIGH (ref 0.00–0.07)
Basophils Absolute: 0 10*3/uL (ref 0.0–0.1)
Basophils Relative: 0 %
Eosinophils Absolute: 0.1 10*3/uL (ref 0.0–0.5)
Eosinophils Relative: 1 %
HCT: 32.4 % — ABNORMAL LOW (ref 36.0–46.0)
Hemoglobin: 10.7 g/dL — ABNORMAL LOW (ref 12.0–15.0)
Immature Granulocytes: 1 %
Lymphocytes Relative: 13 %
Lymphs Abs: 1.1 10*3/uL (ref 0.7–4.0)
MCH: 29.3 pg (ref 26.0–34.0)
MCHC: 33 g/dL (ref 30.0–36.0)
MCV: 88.8 fL (ref 80.0–100.0)
Monocytes Absolute: 0.8 10*3/uL (ref 0.1–1.0)
Monocytes Relative: 9 %
Neutro Abs: 6.7 10*3/uL (ref 1.7–7.7)
Neutrophils Relative %: 76 %
Platelets: 339 10*3/uL (ref 150–400)
RBC: 3.65 MIL/uL — ABNORMAL LOW (ref 3.87–5.11)
RDW: 13.2 % (ref 11.5–15.5)
WBC: 8.8 10*3/uL (ref 4.0–10.5)
nRBC: 0 % (ref 0.0–0.2)

## 2019-10-14 LAB — BASIC METABOLIC PANEL
Anion gap: 11 (ref 5–15)
BUN: 20 mg/dL (ref 6–20)
CO2: 23 mmol/L (ref 22–32)
Calcium: 9.1 mg/dL (ref 8.9–10.3)
Chloride: 104 mmol/L (ref 98–111)
Creatinine, Ser: 0.87 mg/dL (ref 0.44–1.00)
GFR calc Af Amer: >60 mL/min (ref >60)
GFR calc non Af Amer: >60 mL/min (ref >60)
Glucose, Bld: 174 mg/dL — ABNORMAL HIGH (ref 70–99)
Potassium: 4.2 mmol/L (ref 3.5–5.1)
Sodium: 138 mmol/L (ref 135–145)

## 2019-10-14 LAB — GLUCOSE, CAPILLARY
Glucose-Capillary: 159 mg/dL — ABNORMAL HIGH (ref 70–99)
Glucose-Capillary: 203 mg/dL — ABNORMAL HIGH (ref 70–99)

## 2019-10-14 LAB — CULTURE, BLOOD (ROUTINE X 2)
Culture: NO GROWTH
Culture: NO GROWTH
Special Requests: ADEQUATE

## 2019-10-14 LAB — CYTOLOGY - NON PAP

## 2019-10-14 MED ORDER — PANTOPRAZOLE SODIUM 40 MG PO TBEC
40.0000 mg | DELAYED_RELEASE_TABLET | Freq: Every day | ORAL | Status: DC
  Administered 2019-10-14: 40 mg via ORAL
  Filled 2019-10-14: qty 1

## 2019-10-14 MED ORDER — ACETAMINOPHEN 500 MG PO TABS: 500.0000 mg | ORAL_TABLET | Freq: Three times a day (TID) | ORAL | 0 refills | Status: DC

## 2019-10-14 MED ORDER — TRAMADOL HCL 50 MG PO TABS: 50.0000 mg | ORAL_TABLET | Freq: Four times a day (QID) | ORAL | 0 refills | Status: DC | PRN

## 2019-10-14 MED ORDER — PANTOPRAZOLE SODIUM 40 MG PO TBEC: 40.0000 mg | DELAYED_RELEASE_TABLET | Freq: Every day | ORAL | 0 refills | Status: DC

## 2019-10-14 MED ORDER — IBUPROFEN 200 MG PO TABS: 200.0000 mg | ORAL_TABLET | Freq: Three times a day (TID) | ORAL | 0 refills | Status: DC | PRN

## 2019-10-14 MED ORDER — CAPECITABINE 500 MG PO TABS: 1500.0000 mg | ORAL_TABLET | Freq: Two times a day (BID) | ORAL | 6 refills | Status: DC

## 2019-10-14 MED ORDER — GUAIFENESIN-DM 100-10 MG/5ML PO SYRP: 5.0000 mL | ORAL_SOLUTION | Freq: Four times a day (QID) | ORAL | 0 refills | Status: DC | PRN

## 2019-10-14 NOTE — Progress Notes (Signed)
[facility] PCCM  S: feels better, rested well  O:  Vitals:   10/13/19 2305 10/14/19 0400 10/14/19 0808 10/14/19 0821  BP:      Pulse:      Resp:      Temp: 98.5 F (36.9 C) 98.1 F (36.7 C)  98 F (36.7 C)  TempSrc: Oral Oral  Oral  SpO2:   100%   Weight:      Height:       2L [place]  Chest tube drained 1800cc total (1 L vaccutainer, 800 via pleurevac)  General:  Resting comfortably in bed HENT: NCAT OP clear PULM: Diminished on left B, normal effort CV: RRR, no mgr GI: BS+, soft, nontender MSK: normal bulk and tone Neuro: awake, alert, no distress, MAEW  Impression: Breast cancer Malignant left pleural effusion Acute respiratory failure with hypoxemia  Plan: Home O2 evaluation with ambulation, if O2 saturation < 88% needs supplemental O2 D/C pleur-evac 2 view CXR F/U in pulmonary clinic  Roselie Awkward, MD [place] [facility]M Pager: [phone] Cell: [phone] If no response, call [phone]

## 2019-10-14 NOTE — TOC Progression Note (Signed)
Transition of Care [facility]) - Progression Note    Patient Details  Name: Autumn Cook MRN: [id number] Date of Birth: [birth year]  [facility]) CM/SW Contact  Leeroy Cha, RN Phone Number: 10/14/2019, 11:16 AM  Clinical Narrative:    Pluerex Cath inserted on [id number]/paper work and forms completed and faxed to pccm office by Dr. [place] Bells. Patient has one case of canisters ion the room.        Expected Discharge Plan and Services           Expected Discharge Date: 10/14/19                                     Social Determinants of Health (SDOH) Interventions    Readmission Risk Interventions No flowsheet data found.

## 2019-10-14 NOTE — Discharge Summary (Addendum)
Physician Discharge Summary  Autumn Cook MKL:[id number] DOB: Sep 17, 1960 DOA: 10/09/2019  PCP: Darreld Mclean, MD  Admit date: 10/09/2019 Discharge date: 10/14/2019  Admitted From: Home  Disposition:  Home   Recommendations for Outpatient Follow-up and new medication changes:  1. Follow up with Dr. Lorelei Pont in 7 days.  2. Home care for pleural catheter. 3. Pulmonary clinic as scheduled  4. Oncology with [name] as scheduled. 5. Patient placed on ibuprofen, acetaminophen, tramadol and antitussive agents for pain control. 6. GI prophylaxis with pantoprazole  Home Health: yes   Equipment/Devices: left pleural catheter care / home 02   Discharge Condition: stable  CODE STATUS: full  Diet recommendation: heart healthy and diabetic prudent.   Brief/Interim Summary: Patient admitted to the hospital with recurrent malignant left pleural effusion.  59 year old female who presented with dyspnea. She does have a significant past medical history for type 2 diabetes mellitus, hypertension and metastatic breast cancer. May 28 patient underwent thoracentesis to the left pleural effusion, fluid was positive for malignant cells. Her dyspnea has been rapidly progressive, now dyspneic with minimal efforts. It has been associated with pleuritic chest pain and cough. On her initial physical examination blood pressure 104/72, pulse rate 89, respiratory rate 21, temperature 98.5, oxygen saturation 98%. Her lungs had decreased breath sounds on the left, heart S1-S2, present rhythmic, soft abdomen, no lower extremity edema. Sodium 136, potassium 3.6, chloride 97, bicarb 26, glucose 223, BUN 23, creatinine 1.12, white count 8.7, hemoglobin 12.8, hematocrit 38.4, platelets 463. SARS COVID-19 negative. Chest radiograph with a large left pleural effusion with mediastinal shift to the right. EKG 109 bpm, normal axis, normal intervals, sinus rhythm, no ST segment or T wave changes.  Patient was laced  on supplemental 02 per [place], prescribed analgesics and IV fluids with good toleration.   ConsultedPulmonary for US guided thoracentesis, 1300 ml bloody fluid drained. Posteriorly she underwent tunneled pleural catheter placement, with further drainage 1,800 cc of pleural fluids.   Patient will continue pleural catheter care at home.   1.  Acute hypoxic respiratory failure due to recurrent large left pleural effusion, in the setting of metastatic breast cancer.  Patient was admitted to the medical ward, patient received supportive medical therapy with intravenous fluids, analgesics and supplemental oxygen per nasal cannula.  Patient underwent ultrasound-guided thoracentesis, 1300 mL of bloody fluid was removed with good toleration.  Fluid analysis consistent with an exudate, 3,118 nucleated cells, 46% lymphocytes.  Cultures with no growth. Posteriorly patient underwent a tunneled left pleural catheter placement with ultrasound guidance, draining 1800 mL of fluids with good toleration.  Follow-up chest radiograph with no pneumothorax and improvement of aeration.  Patient will continue pain control with acetaminophen, ibuprofen and tramadol.  Follow-up as an outpatient for catheter care and drainage.  Patient will follow up with [name] from the oncology clinic.  2.  Acute kidney injury.  Prerenal renal failure, patient received intravenous fluids with improvement of kidney function.  3.  Uncontrolled type 2 diabetes mellitus, hemoglobin A1c 7.8.  Patient's capillary glucose ranged between 250 and 280, she received insulin sliding scale and basal insulin with good toleration.  At discharge she will resume her home diabetic regimen.  4.  Hypertension.  Patient very deconditioned, her blood pressure 113/64, will continue atenolol, for now hold on amlodipine, lisinopril and HCTZ  5.  COPD.  No signs of exacerbation, continue bronchodilator therapy and advair.   Discharge Diagnoses:   Principal Problem:   Pleural effusion Active Problems:  Hypertension   Malignant neoplasm of overlapping sites of right breast in female, estrogen receptor positive ([place])   Type 2 diabetes mellitus with hyperglycemia ([place])    Discharge Instructions   Allergies as of 10/14/2019      Reactions   Penicillins Hives   Did it involve swelling of the face/tongue/throat, SOB, or low BP? Yes--syncope episode Did it involve sudden or severe rash/hives, skin peeling, or any reaction on the inside of your mouth or nose? No Did you need to seek medical attention at a hospital or doctor's office? Yes When did it last happen? 37-38 YEARS AGO If all above answers are "NO", may proceed with cephalosporin use.   Metformin And Related Diarrhea   Adhesive [tape] Other (See Comments)   "severe bruising" ALSO SKIN GLUE   Asa [aspirin] Other (See Comments)   GI upset   Mango Flavor Hives   Olive Tree Hives   Pt tolerates olive oil      Medication List    STOP taking these medications   amLODipine 5 MG tablet Commonly known as: NORVASC   anastrozole 1 MG tablet Commonly known as: ARIMIDEX   benzonatate 100 MG capsule Commonly known as: TESSALON   cholecalciferol 25 MCG (1000 UNIT) tablet Commonly known as: VITAMIN D3   glipiZIDE 5 MG tablet Commonly known as: GLUCOTROL   levofloxacin 500 MG tablet Commonly known as: Levaquin   lisinopril-hydrochlorothiazide 20-12.5 MG tablet Commonly known as: ZESTORETIC   Potassium Chloride ER 20 MEQ Tbcr     TAKE these medications   acetaminophen 500 MG tablet Commonly known as: TYLENOL Take 1 tablet (500 mg total) by mouth 3 (three) times daily.   Advair HFA 115-21 MCG/ACT inhaler Generic drug: fluticasone-salmeterol Inhale 2 puffs into the lungs 2 (two) times daily.   albuterol 108 (90 Base) MCG/ACT inhaler Commonly known as: VENTOLIN HFA Inhale 2 puffs into the lungs every 4 (four) hours as needed for wheezing or shortness of  breath (cough, shortness of breath or wheezing.).   atenolol 100 MG tablet Commonly known as: TENORMIN TAKE 1 TABLET BY MOUTH EVERY DAY   blood glucose meter kit and supplies Kit Dispense based on patient and insurance preference. Use up to four times daily as directed. (FOR ICD-9 250.00, 250.01).   capecitabine 500 MG tablet Commonly known as: XELODA Take 3 tablets (1,500 mg total) by mouth 2 (two) times daily after a meal. Take daily for 14 days, then do not take for 7 days, then repeat.   glucose blood test strip Commonly known as: ACCU-CHEK ACTIVE STRIPS Use as instructed- check blood sugar up to 2x a day   guaiFENesin-codeine 100-10 MG/5ML syrup Take 5 mLs by mouth every 4 (four) hours as needed for cough.   guaiFENesin-dextromethorphan 100-10 MG/5ML syrup Commonly known as: ROBITUSSIN DM Take 5 mLs by mouth every 6 (six) hours as needed for cough.   ibuprofen 200 MG tablet Commonly known as: ADVIL Take 1 tablet (200 mg total) by mouth every 8 (eight) hours as needed for moderate pain.   pantoprazole 40 MG tablet Commonly known as: PROTONIX Take 1 tablet (40 mg total) by mouth daily. Start taking on: October 15, 2019   traMADol 50 MG tablet Commonly known as: ULTRAM Take 1 tablet (50 mg total) by mouth every 6 (six) hours as needed for severe pain.   Vitamin D (Ergocalciferol) 1.25 MG (50000 UNIT) Caps capsule Commonly known as: DRISDOL TAKE 1 CAPSULE BY MOUTH ONE TIME PER WEEK What changed:  how much to take  how to take this  when to take this  additional instructions       Allergies  Allergen Reactions  . Penicillins Hives    Did it involve swelling of the face/tongue/throat, SOB, or low BP? Yes--syncope episode Did it involve sudden or severe rash/hives, skin peeling, or any reaction on the inside of your mouth or nose? No Did you need to seek medical attention at a hospital or doctor's office? Yes When did it last happen? 37-38 YEARS AGO If all  above answers are "NO", may proceed with cephalosporin use.   . Metformin And Related Diarrhea  . Adhesive [Tape] Other (See Comments)    "severe bruising"  ALSO SKIN GLUE  . Asa [Aspirin] Other (See Comments)    GI upset   . Mango Flavor Hives  . Olive Tree Hives    Pt tolerates olive oil    Consultations:  Pulmonary   Oncology    Procedures/Studies: DG Chest 1 View  Result Date: 10/03/2019 CLINICAL DATA:  Status post left thoracentesis. EXAM: CHEST  1 VIEW COMPARISON:  11/18/2018. FINDINGS: No PowerPort catheter noted on today's exam. Heart size normal. Left lower lobe atelectasis/infiltrate. Moderate left-sided pleural effusion. No pneumothorax post thoracentesis. IMPRESSION: No pneumothorax post thoracentesis. Electronically Signed   By: Marcello Moores  Register   On: 10/03/2019 12:11   MR LIVER W WO CONTRAST  Result Date: 10/04/2019 CLINICAL DATA:  Breast cancer. Indeterminate hepatic lesions on noncontrast CT. MRI recommended for further evaluation. EXAM: MRI ABDOMEN WITHOUT AND WITH CONTRAST TECHNIQUE: Multiplanar multisequence MR imaging of the abdomen was performed both before and after the administration of intravenous contrast. CONTRAST:  65m GADAVIST GADOBUTROL 1 MMOL/ML IV SOLN COMPARISON:  CT Sep 30, 2019 FINDINGS: Lower chest: Large LEFT effusion with atelectasis of the LEFT lower lobe. Potential lingular nodule measuring 1.1 cm (image 30 3/28). Hepatobiliary: Multiple round lesions in the liver which are intermediate hyperintensity on T2 weighted imaging. Example lesion includes 2.8 cm lesion central RIGHT hepatic lobe (image 9/4). 1.6 cm lesion in the dome the RIGHT hepatic lobe (image 7/4). Lesion in the central LEFT hepatic lobe adjacent falciform ligament measuring 2.0 cm image 22/4. In total, there approximately 6-8 lesions in the central LEFT hepatic lobe and RIGHT hepatic lobe. These lesions are hypoenhancing on T1 weighted imaging postcontrast (series 19 and consistent  with hepatic metastasis. There is a large typical benign hepatic cyst centrally in the liver measuring 3.4 cm (image 42/19). Pancreas: Normal pancreatic parenchymal intensity. No ductal dilatation or inflammation. Spleen: Normal spleen. Adrenals/urinary tract: Adrenal glands and kidneys are normal. Stomach/Bowel: Stomach and limited of the small bowel is unremarkable Vascular/Lymphatic: Abdominal aortic normal caliber. No retroperitoneal periportal lymphadenopathy. Musculoskeletal: The postsurgical change in the deep RIGHT breast. IMPRESSION: 1. Unfortunately, evidence of multifocal hepatic metastasis involving the RIGHT hepatic lobe and central LEFT hepatic lobe with 6 to 8 hypoenhancing lesions. 2. Large LEFT effusion and a passive atelectasis LEFT lower lobe. 3. Potential pulmonary nodule versus atelectasis in the lingula. These results will be called to the ordering clinician or representative by the Radiologist Assistant, and communication documented in the PACS or [employer] Electronically Signed   By: SSuzy BouchardM.D.   On: 10/04/2019 13:58   DG Bone Density  Result Date: 09/22/2019 EXAM: DUAL X-RAY ABSORPTIOMETRY (DXA) FOR BONE MINERAL DENSITY IMPRESSION: Referring Physician:  GChauncey CruelYour patient completed a BMD test using Lunar IDXA DXA system ( analysis version: 16 ) manufactured by  [employer]. Technologist: WLS PATIENT: Name: Autumn Cook, Autumn Cook Patient ID: [id number] Birth Date: 12-05-1960 Height: 66.5 in. Sex: Female Measured: 09/22/2019 Weight: 181.5 lbs. Indications: Arimidex, Breast Cancer History, Estrogen Deficient, Low Calcium Intake (269.3), Postmenopausal Fractures: None Treatments: Vitamin D (E933.5) ASSESSMENT: The BMD measured at Femur Neck Right is 0.920 g/cm2 with a T-score of -0.8. This patient is considered normal according to [facility]) criteria. The scan quality is good. Site Region Measured Date Measured Age YA BMD Significant CHANGE  T-score DualFemur Neck Right 09/22/2019    57.2         -0.8    0.920 g/cm2 AP Spine  L1-L4      09/22/2019    57.2         0.0     1.175 g/cm2 DualFemur Total Mean 09/22/2019    57.2         -0.4    0.957 g/cm2 World Health Organization [facility]) criteria for post-menopausal, Caucasian Women: Normal       T-score at or above -1 SD Osteopenia   T-score between -1 and -2.5 SD Osteoporosis T-score at or below -2.5 SD RECOMMENDATION: 1. All patients should optimize calcium and vitamin D intake. 2. Consider FDA approved medical therapies in postmenopausal women and men aged 32 years and older, based on the following: a. A hip or vertebral (clinical or morphometric) fracture b. T- score < or = -2.5 at the femoral neck or spine after appropriate evaluation to exclude secondary causes c. Low bone mass (T-score between -1.0 and -2.5 at the femoral neck or spine) and a 10 year probability of a hip fracture > or = 3% or a 10 year probability of a major osteoporosis-related fracture > or = 20% based on the [place]-adapted WHO algorithm d. Clinician judgment and/or patient preferences may indicate treatment for people with 10-year fracture probabilities above or below these levels FOLLOW-UP: Patients with diagnosis of osteoporosis or at high risk for fracture should have regular bone mineral density tests. For patients eligible for Medicare, routine testing is allowed once every 2 years. The testing frequency can be increased to one year for patients who have rapidly progressing disease, those who are receiving or discontinuing medical therapy to restore bone mass, or have additional risk factors. I have reviewed this report and agree with the above findings. [facility] Electronically Signed   By: Lowella Grip III M.D.   On: 09/22/2019 15:24   DG CHEST PORT 1 VIEW  Result Date: 10/13/2019 CLINICAL DATA:  Status post chest tube placement EXAM: PORTABLE CHEST 1 VIEW COMPARISON:  10/10/2019 FINDINGS: New left chest tube  is coiled in the base. Slight improved aeration in the left lung is noted. Persistent large left pleural effusion remains. Right lung remains clear. Cardiac shadow is stable. IMPRESSION: Chest tube placement on the left with slight improved aeration. No definitive pneumothorax is seen. Electronically Signed   By: Inez Catalina M.D.   On: 10/13/2019 12:07   DG Chest Port 1 View  Result Date: 10/10/2019 CLINICAL DATA:  Status post left-sided thoracentesis. EXAM: PORTABLE CHEST 1 VIEW COMPARISON:  October 09, 2019 FINDINGS: There is complete opacification of the left hemithorax. The small amount of aerated lung seen within the medial aspect of the left upper lobe on the prior study is no longer visualized. The right lung is clear. The heart size and mediastinal contours are limited in evaluation on the left and appear to be within normal limits on the right.  The visualized skeletal structures are unremarkable. IMPRESSION: Complete opacification of the left hemithorax consistent with large left-sided pleural effusion and associated atelectasis and/or infiltrate. Electronically Signed   By: Virgina Norfolk M.D.   On: 10/10/2019 16:17   DG Chest Port 1 View  Result Date: 10/09/2019 CLINICAL DATA:  Shortness of breath, left-sided chest pain EXAM: PORTABLE CHEST 1 VIEW COMPARISON:  10/03/2019 FINDINGS: Single frontal view of the chest demonstrates complete opacification of the left hemithorax consistent with enlarging left pleural effusion and underlying left lung consolidation. Right chest is clear. No evidence of pneumothorax. No acute bony abnormalities. IMPRESSION: 1. Recurrent large left pleural effusion with complete opacification of the left hemithorax. Electronically Signed   By: Randa Ngo M.D.   On: 10/09/2019 16:57   MM DIAG BREAST TOMO BILATERAL  Result Date: 09/22/2019 CLINICAL DATA:  The patient had lumpectomies of the RIGHT breast, 5:30 o'clock and 10 o'clock location and RIGHT axillary node  dissection in August 2020 following neoadjuvant treatment.Patient takes Arimidex. EXAM: DIGITAL DIAGNOSTIC BILATERAL MAMMOGRAM WITH CAD AND TOMO COMPARISON:  10/27/2018 and earlier ACR Breast Density Category b: There are scattered areas of fibroglandular density. FINDINGS: There is skin and trabecular thickening in the RIGHT breast, consistent with previous radiation treatment. Postoperative changes are identified in the LOWER central portion of the RIGHT breast in the UPPER-OUTER QUADRANT consistent previous lumpectomies. LEFT breast is negative. Mammographic images were processed with CAD. IMPRESSION: Expected post treatment changes in the RIGHT breast. No mammographic evidence for malignancy. RECOMMENDATION: Diagnostic mammogram is suggested in 1 year. (Code:[id number]-B-01Y) I have discussed the findings and recommendations with the patient. If applicable, a reminder letter will be sent to the patient regarding the next appointment. BI-RADS CATEGORY  2: Benign. Electronically Signed   By: Nolon Nations M.D.   On: 09/22/2019 16:11   CT RENAL STONE STUDY  Result Date: 09/30/2019 CLINICAL DATA:  Left-sided flank pain.  Kidney stones suspected. EXAM: CT ABDOMEN AND PELVIS WITHOUT CONTRAST TECHNIQUE: Multidetector CT imaging of the abdomen and pelvis was performed following the standard protocol without IV contrast. COMPARISON:  None. FINDINGS: Lower chest: Left pleural effusion. There is opacity at the left lung base, most confluent in the left lower lobe, likely atelectasis. Previous right lumpectomy with evidence of a postoperative fluid collection measuring approximately 3 x 1.3 cm in size, and overlying skin thickening. Hepatobiliary: Liver normal in size and overall attenuation. There is a well-defined hypoattenuating lesion in the central liver measuring 3.5 cm, consistent with a cyst. There is an adjacent less well-defined area of hypoattenuation in the anterior aspect of segment 4 B, approximately 2.8 cm  transversely, which may reflect focal fatty infiltration, but is nonspecific. There is a smaller similar appearing subtle area of hypoattenuation in the right lobe, segment 7, measuring 1.9 cm. No other liver abnormalities. Normal gallbladder. No bile duct dilation. Pancreas: Unremarkable. No pancreatic ductal dilatation or surrounding inflammatory changes. Spleen: Normal in size without focal abnormality. Adrenals/Urinary Tract: No adrenal masses. Kidneys are normal in size, orientation and position. No renal masses, stones or hydronephrosis. Normal ureters. Normal bladder. Stomach/Bowel: Small hiatal hernia. Stomach otherwise unremarkable. Normal small bowel. Colon is normal in caliber. There are scattered colonic diverticula, mostly on the left. No colonic wall thickening or inflammation. Normal appendix visualized. Vascular/Lymphatic: Minor aortic atherosclerosis. No aneurysm. No enlarged lymph nodes. Reproductive: Uterus normal in size. Subserosal fibroid, left posterior lower uterine segment, not well-defined but approximately 2 cm in size. No adnexal masses. Other: No abdominal wall  hernia or abnormality. No abdominopelvic ascites. Musculoskeletal: No fracture or acute finding. No osteoblastic or osteolytic lesions. IMPRESSION: 1. No acute findings within the abdomen or pelvis. No findings to account for left-sided flank pain. No renal or ureteral stones or obstructive uropathy. 2. Left pleural effusion. Left lung base parenchymal opacity consistent with atelectasis or pneumonia or a combination. 3. Two subtle hypoattenuating lesions in the liver, 1 in the right lobe and the other in the left, not well characterized, with metastatic/neoplastic disease not excluded. There is also a more well-defined and lower attenuation hypoattenuating mass in the liver that is likely a cyst. Given the patient's history of breast carcinoma, follow-up liver MRI without and with contrast is recommended to further characterize  the liver lesions. 4. Scattered left colon diverticula without diverticulitis. 5. Minor aortic atherosclerotic calcifications. Electronically Signed   By: Lajean Manes M.D.   On: 09/30/2019 12:04   US Thoracentesis Asp Pleural space w/IMG guide  Result Date: 10/03/2019 INDICATION: History of breast cancer. Left-sided pleural effusion. Request for diagnostic and therapeutic thoracentesis. EXAM: ULTRASOUND GUIDED LEFT THORACENTESIS MEDICATIONS: 1% plain lidocaine, 5 mL COMPLICATIONS: None immediate. Postprocedural chest x-ray negative for pneumothorax. PROCEDURE: An ultrasound guided thoracentesis was thoroughly discussed with the patient and questions answered. The benefits, risks, alternatives and complications were also discussed. The patient understands and wishes to proceed with the procedure. Written consent was obtained. Ultrasound was performed to localize and mark an adequate pocket of fluid in the left chest. The area was then prepped and draped in the normal sterile fashion. 1% Lidocaine was used for local anesthesia. Under ultrasound guidance a 6 Fr Safe-T-Centesis catheter was introduced. Thoracentesis was performed. The catheter was removed and a dressing applied. FINDINGS: A total of approximately 950 mL of hazy serosanguineous fluid was removed. Samples were sent to the laboratory as requested by the clinical team. IMPRESSION: Successful ultrasound guided left thoracentesis yielding 950 mL of pleural fluid. Read by: Ascencion Dike PA-C Electronically Signed   By: Corrie Mckusick D.O.   On: 10/03/2019 12:43      Procedures: left thoracentesis/ left tunneled pleural cathter placement (US guidance)   Subjective: Patient is feeling better, her chest pain and dyspnea have improved, continue to have poor oral intake. No nausea or vomiting. Continue very weak   Discharge Exam: Vitals:   10/14/19 0821 10/14/19 0903  BP:  113/64  Pulse:  (!) 104  Resp:  (!) 21  Temp: 98 F (36.7 C)   SpO2:   100%   Vitals:   10/14/19 0400 10/14/19 0808 10/14/19 0821 10/14/19 0903  BP:    113/64  Pulse:    (!) 104  Resp:    (!) 21  Temp: 98.1 F (36.7 C)  98 F (36.7 C)   TempSrc: Oral  Oral   SpO2:  100%  100%  Weight:      Height:        General: Not in pain, deconditioned  Neurology: Awake and alert, non focal  E ENT: mild pallor, no icterus, oral mucosa moist Cardiovascular: No JVD. S1-S2 present, rhythmic, no gallops, rubs, or murmurs. No lower extremity edema. Pulmonary: positive breath sounds bilaterally, decreased breath sounds at the left base, with no wheezing, rhonchi or rales. Gastrointestinal. Abdomen soft with no organomegaly, non tender, no rebound or guarding Skin. No rashes Musculoskeletal: no joint deformities   The results of significant diagnostics from this hospitalization (including imaging, microbiology, ancillary and laboratory) are listed below for reference.  Microbiology: Recent Results (from the past 240 hour(s))  Culture, blood (routine x 2)     Status: None   Collection Time: 10/09/19  5:02 PM   Specimen: BLOOD  Result Value Ref Range Status   Specimen Description   Final    BLOOD LEFT ANTECUBITAL Performed at [facility], [address] [place], [place] [address]    Special Requests   Final    BOTTLES DRAWN AEROBIC AND ANAEROBIC Blood Culture adequate volume Performed at [facility], [address] [place], [place] [address]    Culture   Final    NO GROWTH 5 DAYS Performed at [facility], [place] [address] [place], [place] [address]    Report Status 10/14/2019 FINAL  Final  Culture, blood (routine x 2)     Status: None   Collection Time: 10/09/19  5:04 PM   Specimen: BLOOD  Result Value Ref Range Status   Specimen Description   Final    BLOOD BLOOD LEFT FOREARM Performed at [facility], [address] [place], [place] [address]    Special Requests   Final    BOTTLES DRAWN AEROBIC ONLY  Blood Culture results may not be optimal due to an inadequate volume of blood received in culture bottles Performed at [facility], [address] [place], [place] [address]    Culture   Final    NO GROWTH 5 DAYS Performed at [facility], [place] [address] [place], [place] [address]    Report Status 10/14/2019 FINAL  Final  SARS Coronavirus 2 by RT PCR (hospital order, performed in [facility] hospital lab) Nasopharyngeal Nasopharyngeal Swab     Status: None   Collection Time: 10/09/19  5:29 PM   Specimen: Nasopharyngeal Swab  Result Value Ref Range Status   SARS Coronavirus 2 NEGATIVE NEGATIVE Final    Comment: (NOTE) SARS-CoV-2 target nucleic acids are NOT DETECTED. The SARS-CoV-2 RNA is generally detectable in upper and lower respiratory specimens during the acute phase of infection. The lowest concentration of SARS-CoV-2 viral copies this assay can detect is 250 copies / mL. A negative result does not preclude SARS-CoV-2 infection and should not be used as the sole basis for treatment or other patient management decisions.  A negative result may occur with improper specimen collection / handling, submission of specimen other than nasopharyngeal swab, presence of viral mutation(s) within the areas targeted by this assay, and inadequate number of viral copies (<250 copies / mL). A negative result must be combined with clinical observations, patient history, and epidemiological information. Fact Sheet for Patients:   StrictlyIdeas.no Fact Sheet for Healthcare Providers: BankingDealers.co.za This test is not yet approved or cleared  by the [place] FDA and has been authorized for detection and/or diagnosis of SARS-CoV-2 by FDA under an Emergency Use Authorization (EUA).  This EUA will remain in effect (meaning this test can be used) for the duration of the COVID-19 declaration under Section 564(b)(1) of the Act, 21  U.S.C. section 360bbb-3(b)(1), unless the authorization is terminated or revoked sooner. Performed at [facility], [address] [place], [place] [address]   MRSA PCR Screening     Status: None   Collection Time: 10/13/19  9:37 AM   Specimen: Nasal Mucosa; Nasopharyngeal  Result Value Ref Range Status   MRSA by PCR NEGATIVE NEGATIVE Final    Comment:  The GeneXpert MRSA Assay (FDA approved for NASAL specimens only), is one component of a comprehensive MRSA colonization surveillance program. It is not intended to diagnose MRSA infection nor to guide or monitor treatment for MRSA infections. Performed at [facility], [place] [address], [place], [place] [address]      Labs: BNP (last 3 results) No results for input(s): BNP in the last 8760 hours. Basic Metabolic Panel: Recent Labs  Lab 10/10/19 0512 10/11/19 0454 10/12/19 0459 10/13/19 0600 10/14/19 0242  NA 137 136 138 137 138  K 4.3 3.9 4.2 4.3 4.2  CL 99 103 103 102 104  CO2 25 25 22  21* 23  GLUCOSE 287* 196* 236* 291* 174*  BUN 33* 28* 20 17 20   CREATININE 1.35* 0.94 0.78 0.72 0.87  CALCIUM 9.3 8.9 8.7* 9.4 9.1   Liver Function Tests: No results for input(s): AST, ALT, ALKPHOS, BILITOT, PROT, ALBUMIN in the last 168 hours. No results for input(s): LIPASE, AMYLASE in the last 168 hours. No results for input(s): AMMONIA in the last 168 hours. CBC: Recent Labs  Lab 10/09/19 1554 10/10/19 0512 10/12/19 0459 10/13/19 0600 10/14/19 0242  WBC 8.7 9.8 9.2 10.9* 8.8  NEUTROABS 6.5  --  7.4 8.9* 6.7  HGB 12.8 11.8* 10.8* 11.4* 10.7*  HCT 38.4 35.5* 33.0* 34.6* 32.4*  MCV 88.1 89.0 88.9 89.9 88.8  PLT 463* 396 327 401* 339   Cardiac Enzymes: No results for input(s): CKTOTAL, CKMB, CKMBINDEX, TROPONINI in the last 168 hours. BNP: Invalid input(s): POCBNP CBG: Recent Labs  Lab 10/13/19 0817 10/13/19 1214 10/13/19 1655 10/13/19 2124 10/14/19 0738  GLUCAP 284* 251*  186* 132* 159*   D-Dimer No results for input(s): DDIMER in the last 72 hours. Hgb A1c No results for input(s): HGBA1C in the last 72 hours. Lipid Profile No results for input(s): CHOL, HDL, LDLCALC, TRIG, CHOLHDL, LDLDIRECT in the last 72 hours. Thyroid function studies No results for input(s): TSH, T4TOTAL, T3FREE, THYROIDAB in the last 72 hours.  Invalid input(s): FREET3 Anemia work up No results for input(s): VITAMINB12, FOLATE, FERRITIN, TIBC, IRON, RETICCTPCT in the last 72 hours. Urinalysis    Component Value Date/Time   COLORURINE YELLOW 03/07/2016 1350   APPEARANCEUR CLEAR 03/07/2016 1350   LABSPEC 1.031 (H) 03/07/2016 1350   PHURINE 5.0 03/07/2016 1350   GLUCOSEU >1000 (A) 03/07/2016 1350   HGBUR NEGATIVE 03/07/2016 1350   BILIRUBINUR NEGATIVE 03/07/2016 1350   BILIRUBINUR neg 11/24/2013 1547   KETONESUR 15 (A) 03/07/2016 1350   PROTEINUR NEGATIVE 03/07/2016 1350   UROBILINOGEN 0.2 11/24/2013 1547   UROBILINOGEN 0.2 11/10/2006 1153   NITRITE NEGATIVE 03/07/2016 [place] 03/07/2016 1350   Sepsis Labs Invalid input(s): PROCALCITONIN,  WBC,  LACTICIDVEN Microbiology Recent Results (from the past 240 hour(s))  Culture, blood (routine x 2)     Status: None   Collection Time: 10/09/19  5:02 PM   Specimen: BLOOD  Result Value Ref Range Status   Specimen Description   Final    BLOOD LEFT ANTECUBITAL Performed at [facility], [address] [place], [place] [address]    Special Requests   Final    BOTTLES DRAWN AEROBIC AND ANAEROBIC Blood Culture adequate volume Performed at [facility], [address] [place], [place] [address]    Culture   Final    NO GROWTH 5 DAYS Performed at [facility], [place] [address] [place], [place] [address]  Report Status 10/14/2019 FINAL  Final  Culture, blood (routine x 2)     Status: None   Collection Time: 10/09/19  5:04 PM   Specimen: BLOOD  Result Value Ref Range Status    Specimen Description   Final    BLOOD BLOOD LEFT FOREARM Performed at [facility], [address] [place], [place] [address]    Special Requests   Final    BOTTLES DRAWN AEROBIC ONLY Blood Culture results may not be optimal due to an inadequate volume of blood received in culture bottles Performed at [facility], [address] [place], [place] [address]    Culture   Final    NO GROWTH 5 DAYS Performed at [facility], [place] [address], [place], [place] [address]    Report Status 10/14/2019 FINAL  Final  SARS Coronavirus 2 by RT PCR (hospital order, performed in [facility] hospital lab) Nasopharyngeal Nasopharyngeal Swab     Status: None   Collection Time: 10/09/19  5:29 PM   Specimen: Nasopharyngeal Swab  Result Value Ref Range Status   SARS Coronavirus 2 NEGATIVE NEGATIVE Final    Comment: (NOTE) SARS-CoV-2 target nucleic acids are NOT DETECTED. The SARS-CoV-2 RNA is generally detectable in upper and lower respiratory specimens during the acute phase of infection. The lowest concentration of SARS-CoV-2 viral copies this assay can detect is 250 copies / mL. A negative result does not preclude SARS-CoV-2 infection and should not be used as the sole basis for treatment or other patient management decisions.  A negative result may occur with improper specimen collection / handling, submission of specimen other than nasopharyngeal swab, presence of viral mutation(s) within the areas targeted by this assay, and inadequate number of viral copies (<250 copies / mL). A negative result must be combined with clinical observations, patient history, and epidemiological information. Fact Sheet for Patients:   StrictlyIdeas.no Fact Sheet for Healthcare Providers: BankingDealers.co.za This test is not yet approved or cleared  by the [place] FDA and has been authorized for detection and/or diagnosis of SARS-CoV-2  by FDA under an Emergency Use Authorization (EUA).  This EUA will remain in effect (meaning this test can be used) for the duration of the COVID-19 declaration under Section 564(b)(1) of the Act, 21 U.S.C. section 360bbb-3(b)(1), unless the authorization is terminated or revoked sooner. Performed at [facility], [address] [place], [place] [address]   MRSA PCR Screening     Status: None   Collection Time: 10/13/19  9:37 AM   Specimen: Nasal Mucosa; Nasopharyngeal  Result Value Ref Range Status   MRSA by PCR NEGATIVE NEGATIVE Final    Comment:        The GeneXpert MRSA Assay (FDA approved for NASAL specimens only), is one component of a comprehensive MRSA colonization surveillance program. It is not intended to diagnose MRSA infection nor to guide or monitor treatment for MRSA infections. Performed at [facility], [place] [address], [place], [place] [address]      Time coordinating discharge: 45 minutes  SIGNED:   Tawni Millers, MD  [facility] 10/14/2019, 9:48 AM

## 2019-10-14 NOTE — Plan of Care (Signed)
D/c home with daughter. Pleur X catheter education provided to patients daughter [name]. Box of Pleur X catheter Drainage system drains sent home with patient.   Home O2 education provided, home with o2 at 2 liters [place].

## 2019-10-14 NOTE — Telephone Encounter (Signed)
Oral Chemotherapy Pharmacist Encounter  Due to insurance restriction the medication could not be filled at [facility]. Prescription has been e-scribed to [facility].  Supportive information was faxed to [facility]. We will continue to follow medication access.   Attempted to call Autumn Cook to let her know, no answer, LVM for patient to call me back.  Darl Pikes, PharmD, BCPS, [facility] Hematology/Oncology Clinical Pharmacist [facility] [phone]  10/14/2019 10:07 AM

## 2019-10-14 NOTE — TOC Progression Note (Addendum)
Transition of Care [facility]) - Progression Note    Patient Details  Name: Autumn Cook MRN: [id number] Date of Birth: 02/04/1961  Transition of Care [facility]) CM/SW Contact  Leeroy Cha, RN Phone Number: 10/14/2019, 12:09 PM  Clinical Narrative:    Discharged to home with self carte. Orders checked for dc needs no hhc orders found/ patient has help at home and is aware on care of the plurex cath. Orders for home o2 obtained and ordered through [facility] home dme-Zack Blank at 1321.  Expected Discharge Plan: Home/Self Care Barriers to Discharge: No Barriers Identified  Expected Discharge Plan and Services Expected Discharge Plan: Home/Self Care   Discharge Planning Services: CM Consult   Living arrangements for the past 2 months: Single Family Home Expected Discharge Date: 10/14/19                                     Social Determinants of Health (SDOH) Interventions    Readmission Risk Interventions No flowsheet data found.

## 2019-10-14 NOTE — Evaluation (Signed)
Physical Therapy Evaluation Patient Details Name: Autumn Cook MRN: [id number] DOB: Dec 09, 1960 Today's Date: 10/14/2019   History of Present Illness  -year-old female who presented with dyspnea, PMH: type 2 diabetes mellitus, hypertension and metastatic breast cancer.  May 28 patient underwent thoracentesis to the left pleural effusion, fluid was positive for malignant cells.  Chest radiograph with a large left pleural effusion with mediastinal shift to the right. S/P L chest tube placed  Clinical Impression  The patient was eager to mobilize, reports feeling very weak. SPO2 on RA 96%, while ambulating a short distance, 86%, on 3 L 95%. Patient requesting pain medication , RN aware. Patient should progress to return home. Pt admitted with above diagnosis.  Pt currently with functional limitations due to the deficits listed below (see PT Problem List). Pt will benefit from skilled PT to increase their independence and safety with mobility to allow discharge to the venue listed below.       Follow Up Recommendations No PT follow up    Equipment Recommendations  Rolling walker with 5" wheels;3in1 (PT)(if pt. desires)    Recommendations for Other Services       Precautions / Restrictions Precautions Precautions: Fall      Mobility  Bed Mobility Overal bed mobility: Needs Assistance Bed Mobility: Supine to Sit     Supine to sit: HOB elevated;Min guard     General bed mobility comments: extra time  Transfers Overall transfer level: Needs assistance Equipment used: Rolling walker (2 wheeled) Transfers: Sit to/from Stand Sit to Stand: Min assist         General transfer comment: slow to rise with increased left chest  wall pain  Ambulation/Gait Ambulation/Gait assistance: Min assist;+2 safety/equipment Gait Distance (Feet): 30 Feet Assistive device: Rolling walker (2 wheeled) Gait Pattern/deviations: Step-to pattern;Step-through pattern Gait velocity: decr   General  Gait Details: very slow speed, stopped for C/o increased pain intermittentently  Stairs            Wheelchair Mobility    Modified Rankin (Stroke Patients Only)       Balance Overall balance assessment: Needs assistance Sitting-balance support: Feet supported Sitting balance-Leahy Scale: Fair                                       Pertinent Vitals/Pain Pain Assessment: 0-10 Pain Score: 8  Pain Location: post mobility Pain Descriptors / Indicators: Discomfort;Cramping;Grimacing Pain Intervention(s): Monitored during session;Patient requesting pain meds-RN notified;Limited activity within patient's tolerance;Repositioned    Home Living Family/patient expects to be discharged to:: Private residence Living Arrangements: Spouse/significant other;Other relatives Available Help at Discharge: Family Type of Home: House Home Access: Level entry     Home Layout: One level Home Equipment: None      Prior Function Level of Independence: Independent               Hand Dominance        Extremity/Trunk Assessment   Upper Extremity Assessment Upper Extremity Assessment: Generalized weakness    Lower Extremity Assessment Lower Extremity Assessment: Generalized weakness    Cervical / Trunk Assessment Cervical / Trunk Assessment: Normal  Communication   Communication: No difficulties  Cognition Arousal/Alertness: Awake/alert Behavior During Therapy: WFL for tasks assessed/performed Overall Cognitive Status: Within Functional Limits for tasks assessed  General Comments      Exercises     Assessment/Plan    PT Assessment Patient needs continued PT services  PT Problem List Decreased strength;Decreased mobility;Decreased range of motion;Decreased activity tolerance;Decreased safety awareness;Pain       PT Treatment Interventions DME instruction;Therapeutic activities;Gait  training;Therapeutic exercise;Functional mobility training    PT Goals (Current goals can be found in the Care Plan section)  Acute Rehab PT Goals Patient Stated Goal: to go home PT Goal Formulation: With patient/family Time For Goal Achievement: 10/28/19 Potential to Achieve Goals: Good    Frequency Min 3X/week   Barriers to discharge        Co-evaluation               AM-PAC PT "6 Clicks" Mobility  Outcome Measure Help needed turning from your back to your side while in a flat bed without using bedrails?: A Little Help needed moving from lying on your back to sitting on the side of a flat bed without using bedrails?: A Little Help needed moving to and from a bed to a chair (including a wheelchair)?: A Little Help needed standing up from a chair using your arms (e.g., wheelchair or bedside chair)?: A Little Help needed to walk in hospital room?: A Lot Help needed climbing 3-5 steps with a railing? : A Lot 6 Click Score: 16    End of Session   Activity Tolerance: Patient limited by fatigue;Patient limited by pain Patient left: in chair;with call bell/phone within reach;with family/visitor present;with nursing/sitter in room Nurse Communication: Mobility status PT Visit Diagnosis: Unsteadiness on feet (R26.81);Pain;Difficulty in walking, not elsewhere classified (R26.2) Pain - Right/Left: Left    Time: 4665-9935 PT Time Calculation (min) (ACUTE ONLY): 40 min   Charges:   PT Evaluation $PT Eval Low Complexity: 1 Low PT Treatments $Gait Training: 8-22 mins $Self Care/Home Management: [place] Pager [phone] Office [phone]   Claretha [place] 10/14/2019, 1:41 PM

## 2019-10-14 NOTE — Telephone Encounter (Signed)
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  BMP from 10/14/19 assessed, no relevant lab abnormalities. Prescription dose and frequency assessed.   Current medication list in Epic reviewed, no DDIs with capecitabine identified.  Prescription has been e-scribed to the [facility] for benefits analysis and approval.  Oral Oncology Clinic will continue to follow for insurance authorization, copayment issues, initial counseling and start date.  Darl Pikes, PharmD, BCPS, BCOP, CPP Hematology/Oncology Clinical Pharmacist Practitioner [facility] [phone]  10/14/2019 9:22 AM

## 2019-10-14 NOTE — Progress Notes (Signed)
SATURATION QUALIFICATIONS: (This note is used to comply with regulatory documentation for home oxygen)  Patient Saturations on Room Air at Rest = 96%  Patient Saturations on Room Air while Ambulating = *86%  Patient Saturations on 3Liters of oxygen while Ambulating = 95%  Please briefly explain why patient needs home oxygen:To ,aintain saturation >88% for mobility. [place] Pager [phone] Office [phone]

## 2019-10-15 ENCOUNTER — Telehealth: Payer: Self-pay | Admitting: *Deleted

## 2019-10-15 ENCOUNTER — Encounter: Payer: Self-pay | Admitting: Oncology

## 2019-10-15 ENCOUNTER — Encounter: Payer: Self-pay | Admitting: Family Medicine

## 2019-10-15 ENCOUNTER — Other Ambulatory Visit: Payer: Self-pay

## 2019-10-15 ENCOUNTER — Encounter (HOSPITAL_BASED_OUTPATIENT_CLINIC_OR_DEPARTMENT_OTHER): Payer: Self-pay | Admitting: Emergency Medicine

## 2019-10-15 ENCOUNTER — Emergency Department (HOSPITAL_BASED_OUTPATIENT_CLINIC_OR_DEPARTMENT_OTHER): Payer: 59

## 2019-10-15 DIAGNOSIS — Z20822 Contact with and (suspected) exposure to covid-19: Secondary | ICD-10-CM | POA: Insufficient documentation

## 2019-10-15 DIAGNOSIS — J9600 Acute respiratory failure, unspecified whether with hypoxia or hypercapnia: Secondary | ICD-10-CM | POA: Diagnosis not present

## 2019-10-15 DIAGNOSIS — R61 Generalized hyperhidrosis: Secondary | ICD-10-CM | POA: Insufficient documentation

## 2019-10-15 DIAGNOSIS — R0602 Shortness of breath: Secondary | ICD-10-CM | POA: Insufficient documentation

## 2019-10-15 DIAGNOSIS — R0789 Other chest pain: Secondary | ICD-10-CM | POA: Insufficient documentation

## 2019-10-15 DIAGNOSIS — C50811 Malignant neoplasm of overlapping sites of right female breast: Secondary | ICD-10-CM | POA: Diagnosis not present

## 2019-10-15 DIAGNOSIS — Z5321 Procedure and treatment not carried out due to patient leaving prior to being seen by health care provider: Secondary | ICD-10-CM | POA: Insufficient documentation

## 2019-10-15 NOTE — Telephone Encounter (Signed)
1st attempt. Unable to reach patient. Voice mailbox was full. Will attempt again tomorrow.

## 2019-10-15 NOTE — Telephone Encounter (Signed)
Oral Chemotherapy Pharmacist Encounter  Spoke with patient's daughter Autumn Cook she knows the Xeloda prescription was [facility]. Provided her with phone number to [facility]. She knows to call with any issues.  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   Counseled Autumn Cook on administration, dosing, side effects, monitoring, drug-food interactions, safe handling, storage, and disposal. Patient will take 3 tablets (1,500 mg total) by mouth 2 (two) times daily after a meal. Take daily for 14 days, then do not take for 7 days, then repeat.  Side effects include but not limited to: diarrhea, hand-foot syndrome, N/V, fatigue, edema, decreased wbc.    Reviewed with Autumn Cook importance of keeping a medication schedule and plan for any missed doses.  Autumn Cook voiced understanding and appreciation. All questions answered. Medication handout placed in the mail.  Provided Autumn Cook with Oral Chemotherapy Navigation [facility] phone number. Autumn Cook knows to call the office with questions or concerns. Oral Chemotherapy Navigation Clinic will continue to follow.  Darl Pikes, PharmD, BCPS, BCOP, CPP Hematology/Oncology Clinical Pharmacist Practitioner [facility] [phone]  10/15/2019 3:25 PM

## 2019-10-15 NOTE — ED Triage Notes (Signed)
Pt has had 2 thoracentesis and had a chest tube inserted 2 days ago  Pt is now c/o chest pain that started this evening  Pt also c/o shortness of breath and constant sweating   Pleurex drain is in place to the left side

## 2019-10-15 NOTE — Telephone Encounter (Signed)
[name] called to say they want to get a second opinion regarding Malli's cancer.   They are going to return a call N[place]a Alpha, [facility] regarding starting Xeloda.

## 2019-10-16 ENCOUNTER — Inpatient Hospital Stay: Payer: Self-pay

## 2019-10-16 ENCOUNTER — Encounter (HOSPITAL_COMMUNITY): Payer: Self-pay | Admitting: Emergency Medicine

## 2019-10-16 ENCOUNTER — Emergency Department (HOSPITAL_COMMUNITY)
Admission: EM | Admit: 2019-10-16 | Discharge: 2019-10-16 | Discharge status: Absent without leave | Payer: 59 | Source: Home / Self Care

## 2019-10-16 ENCOUNTER — Observation Stay (HOSPITAL_BASED_OUTPATIENT_CLINIC_OR_DEPARTMENT_OTHER): Payer: 59

## 2019-10-16 ENCOUNTER — Inpatient Hospital Stay (HOSPITAL_COMMUNITY): Payer: 59

## 2019-10-16 ENCOUNTER — Inpatient Hospital Stay (HOSPITAL_COMMUNITY)
Admission: EM | Admit: 2019-10-16 | Discharge: 2019-11-13 | DRG: 597 | Disposition: A | Discharge status: Home Health | Payer: 59 | Attending: Internal Medicine | Admitting: Internal Medicine

## 2019-10-16 ENCOUNTER — Observation Stay (HOSPITAL_COMMUNITY): Payer: 59

## 2019-10-16 ENCOUNTER — Emergency Department (HOSPITAL_BASED_OUTPATIENT_CLINIC_OR_DEPARTMENT_OTHER)
Admission: EM | Admit: 2019-10-16 | Discharge: 2019-10-16 | Disposition: A | Discharge status: Home / Self Care | Payer: 59 | Source: Home / Self Care | Attending: Emergency Medicine | Admitting: Emergency Medicine

## 2019-10-16 ENCOUNTER — Emergency Department (HOSPITAL_BASED_OUTPATIENT_CLINIC_OR_DEPARTMENT_OTHER): Payer: 59

## 2019-10-16 ENCOUNTER — Telehealth: Payer: Self-pay | Admitting: Oncology

## 2019-10-16 DIAGNOSIS — K219 Gastro-esophageal reflux disease without esophagitis: Secondary | ICD-10-CM | POA: Diagnosis present

## 2019-10-16 DIAGNOSIS — K5903 Drug induced constipation: Secondary | ICD-10-CM | POA: Diagnosis present

## 2019-10-16 DIAGNOSIS — Z5329 Procedure and treatment not carried out because of patient's decision for other reasons: Secondary | ICD-10-CM | POA: Diagnosis not present

## 2019-10-16 DIAGNOSIS — J90 Pleural effusion, not elsewhere classified: Secondary | ICD-10-CM | POA: Diagnosis not present

## 2019-10-16 DIAGNOSIS — Z20822 Contact with and (suspected) exposure to covid-19: Secondary | ICD-10-CM | POA: Diagnosis present

## 2019-10-16 DIAGNOSIS — R52 Pain, unspecified: Secondary | ICD-10-CM | POA: Diagnosis not present

## 2019-10-16 DIAGNOSIS — J9601 Acute respiratory failure with hypoxia: Secondary | ICD-10-CM | POA: Diagnosis present

## 2019-10-16 DIAGNOSIS — F419 Anxiety disorder, unspecified: Secondary | ICD-10-CM | POA: Diagnosis present

## 2019-10-16 DIAGNOSIS — Z79891 Long term (current) use of opiate analgesic: Secondary | ICD-10-CM | POA: Diagnosis not present

## 2019-10-16 DIAGNOSIS — Z7289 Other problems related to lifestyle: Secondary | ICD-10-CM

## 2019-10-16 DIAGNOSIS — I10 Essential (primary) hypertension: Secondary | ICD-10-CM | POA: Diagnosis not present

## 2019-10-16 DIAGNOSIS — R609 Edema, unspecified: Secondary | ICD-10-CM

## 2019-10-16 DIAGNOSIS — Z9221 Personal history of antineoplastic chemotherapy: Secondary | ICD-10-CM

## 2019-10-16 DIAGNOSIS — Z171 Estrogen receptor negative status [ER-]: Secondary | ICD-10-CM

## 2019-10-16 DIAGNOSIS — Z452 Encounter for adjustment and management of vascular access device: Secondary | ICD-10-CM | POA: Diagnosis not present

## 2019-10-16 DIAGNOSIS — Z7951 Long term (current) use of inhaled steroids: Secondary | ICD-10-CM | POA: Diagnosis not present

## 2019-10-16 DIAGNOSIS — J9621 Acute and chronic respiratory failure with hypoxia: Secondary | ICD-10-CM | POA: Diagnosis present

## 2019-10-16 DIAGNOSIS — Z79899 Other long term (current) drug therapy: Secondary | ICD-10-CM

## 2019-10-16 DIAGNOSIS — Z9889 Other specified postprocedural states: Secondary | ICD-10-CM

## 2019-10-16 DIAGNOSIS — Z880 Allergy status to penicillin: Secondary | ICD-10-CM

## 2019-10-16 DIAGNOSIS — R531 Weakness: Secondary | ICD-10-CM | POA: Diagnosis not present

## 2019-10-16 DIAGNOSIS — Z7189 Other specified counseling: Secondary | ICD-10-CM | POA: Diagnosis not present

## 2019-10-16 DIAGNOSIS — C50911 Malignant neoplasm of unspecified site of right female breast: Secondary | ICD-10-CM | POA: Diagnosis not present

## 2019-10-16 DIAGNOSIS — J189 Pneumonia, unspecified organism: Secondary | ICD-10-CM

## 2019-10-16 DIAGNOSIS — J302 Other seasonal allergic rhinitis: Secondary | ICD-10-CM | POA: Diagnosis present

## 2019-10-16 DIAGNOSIS — E1165 Type 2 diabetes mellitus with hyperglycemia: Secondary | ICD-10-CM | POA: Diagnosis not present

## 2019-10-16 DIAGNOSIS — Z170 Estrogen receptor positive status [ER+]: Secondary | ICD-10-CM

## 2019-10-16 DIAGNOSIS — K5900 Constipation, unspecified: Secondary | ICD-10-CM | POA: Diagnosis not present

## 2019-10-16 DIAGNOSIS — G893 Neoplasm related pain (acute) (chronic): Secondary | ICD-10-CM

## 2019-10-16 DIAGNOSIS — Z86718 Personal history of other venous thrombosis and embolism: Secondary | ICD-10-CM

## 2019-10-16 DIAGNOSIS — R42 Dizziness and giddiness: Secondary | ICD-10-CM | POA: Diagnosis not present

## 2019-10-16 DIAGNOSIS — E7800 Pure hypercholesterolemia, unspecified: Secondary | ICD-10-CM

## 2019-10-16 DIAGNOSIS — E785 Hyperlipidemia, unspecified: Secondary | ICD-10-CM | POA: Diagnosis present

## 2019-10-16 DIAGNOSIS — Z87891 Personal history of nicotine dependence: Secondary | ICD-10-CM

## 2019-10-16 DIAGNOSIS — C787 Secondary malignant neoplasm of liver and intrahepatic bile duct: Secondary | ICD-10-CM

## 2019-10-16 DIAGNOSIS — Z515 Encounter for palliative care: Secondary | ICD-10-CM

## 2019-10-16 DIAGNOSIS — T402X5A Adverse effect of other opioids, initial encounter: Secondary | ICD-10-CM | POA: Diagnosis not present

## 2019-10-16 DIAGNOSIS — I82622 Acute embolism and thrombosis of deep veins of left upper extremity: Secondary | ICD-10-CM | POA: Diagnosis not present

## 2019-10-16 DIAGNOSIS — I82409 Acute embolism and thrombosis of unspecified deep veins of unspecified lower extremity: Secondary | ICD-10-CM

## 2019-10-16 DIAGNOSIS — J9600 Acute respiratory failure, unspecified whether with hypoxia or hypercapnia: Secondary | ICD-10-CM | POA: Insufficient documentation

## 2019-10-16 DIAGNOSIS — I878 Other specified disorders of veins: Secondary | ICD-10-CM

## 2019-10-16 DIAGNOSIS — R8271 Bacteriuria: Secondary | ICD-10-CM | POA: Diagnosis not present

## 2019-10-16 DIAGNOSIS — T801XXA Vascular complications following infusion, transfusion and therapeutic injection, initial encounter: Secondary | ICD-10-CM

## 2019-10-16 DIAGNOSIS — Z91048 Other nonmedicinal substance allergy status: Secondary | ICD-10-CM

## 2019-10-16 DIAGNOSIS — J45909 Unspecified asthma, uncomplicated: Secondary | ICD-10-CM | POA: Diagnosis not present

## 2019-10-16 DIAGNOSIS — Z923 Personal history of irradiation: Secondary | ICD-10-CM

## 2019-10-16 DIAGNOSIS — Z9981 Dependence on supplemental oxygen: Secondary | ICD-10-CM

## 2019-10-16 DIAGNOSIS — D638 Anemia in other chronic diseases classified elsewhere: Secondary | ICD-10-CM | POA: Diagnosis present

## 2019-10-16 DIAGNOSIS — M7989 Other specified soft tissue disorders: Secondary | ICD-10-CM | POA: Diagnosis not present

## 2019-10-16 DIAGNOSIS — Z886 Allergy status to analgesic agent status: Secondary | ICD-10-CM

## 2019-10-16 DIAGNOSIS — N179 Acute kidney failure, unspecified: Secondary | ICD-10-CM | POA: Diagnosis not present

## 2019-10-16 DIAGNOSIS — C50811 Malignant neoplasm of overlapping sites of right female breast: Secondary | ICD-10-CM

## 2019-10-16 DIAGNOSIS — Z888 Allergy status to other drugs, medicaments and biological substances status: Secondary | ICD-10-CM

## 2019-10-16 DIAGNOSIS — C7981 Secondary malignant neoplasm of breast: Secondary | ICD-10-CM | POA: Diagnosis not present

## 2019-10-16 DIAGNOSIS — I119 Hypertensive heart disease without heart failure: Secondary | ICD-10-CM | POA: Diagnosis present

## 2019-10-16 DIAGNOSIS — J910 Malignant pleural effusion: Secondary | ICD-10-CM

## 2019-10-16 DIAGNOSIS — IMO0002 Reserved for concepts with insufficient information to code with codable children: Secondary | ICD-10-CM

## 2019-10-16 DIAGNOSIS — E118 Type 2 diabetes mellitus with unspecified complications: Secondary | ICD-10-CM

## 2019-10-16 DIAGNOSIS — Z801 Family history of malignant neoplasm of trachea, bronchus and lung: Secondary | ICD-10-CM

## 2019-10-16 DIAGNOSIS — M79609 Pain in unspecified limb: Secondary | ICD-10-CM | POA: Diagnosis not present

## 2019-10-16 DIAGNOSIS — Z91018 Allergy to other foods: Secondary | ICD-10-CM

## 2019-10-16 DIAGNOSIS — C50919 Malignant neoplasm of unspecified site of unspecified female breast: Secondary | ICD-10-CM | POA: Diagnosis not present

## 2019-10-16 DIAGNOSIS — R0902 Hypoxemia: Secondary | ICD-10-CM

## 2019-10-16 DIAGNOSIS — I82A12 Acute embolism and thrombosis of left axillary vein: Secondary | ICD-10-CM | POA: Diagnosis not present

## 2019-10-16 DIAGNOSIS — C773 Secondary and unspecified malignant neoplasm of axilla and upper limb lymph nodes: Secondary | ICD-10-CM | POA: Diagnosis not present

## 2019-10-16 LAB — GLUCOSE, PLEURAL OR PERITONEAL FLUID: Glucose, Fluid: 121 mg/dL

## 2019-10-16 LAB — CBC WITH DIFFERENTIAL/PLATELET
Abs Immature Granulocytes: 0.08 10*3/uL — ABNORMAL HIGH (ref 0.00–0.07)
Basophils Absolute: 0 10*3/uL (ref 0.0–0.1)
Basophils Relative: 0 %
Eosinophils Absolute: 0.1 10*3/uL (ref 0.0–0.5)
Eosinophils Relative: 1 %
HCT: 32.8 % — ABNORMAL LOW (ref 36.0–46.0)
Hemoglobin: 10.7 g/dL — ABNORMAL LOW (ref 12.0–15.0)
Immature Granulocytes: 1 %
Lymphocytes Relative: 11 %
Lymphs Abs: 0.9 10*3/uL (ref 0.7–4.0)
MCH: 29.1 pg (ref 26.0–34.0)
MCHC: 32.6 g/dL (ref 30.0–36.0)
MCV: 89.1 fL (ref 80.0–100.0)
Monocytes Absolute: 0.7 10*3/uL (ref 0.1–1.0)
Monocytes Relative: 9 %
Neutro Abs: 5.9 10*3/uL (ref 1.7–7.7)
Neutrophils Relative %: 78 %
Platelets: 375 10*3/uL (ref 150–400)
RBC: 3.68 MIL/uL — ABNORMAL LOW (ref 3.87–5.11)
RDW: 13.2 % (ref 11.5–15.5)
WBC: 7.6 10*3/uL (ref 4.0–10.5)
nRBC: 0 % (ref 0.0–0.2)

## 2019-10-16 LAB — CBC
HCT: 30.9 % — ABNORMAL LOW (ref 36.0–46.0)
Hemoglobin: 10.2 g/dL — ABNORMAL LOW (ref 12.0–15.0)
MCH: 29.6 pg (ref 26.0–34.0)
MCHC: 33 g/dL (ref 30.0–36.0)
MCV: 89.6 fL (ref 80.0–100.0)
Platelets: 363 10*3/uL (ref 150–400)
RBC: 3.45 MIL/uL — ABNORMAL LOW (ref 3.87–5.11)
RDW: 13.3 % (ref 11.5–15.5)
WBC: 6.9 10*3/uL (ref 4.0–10.5)
nRBC: 0 % (ref 0.0–0.2)

## 2019-10-16 LAB — AMYLASE, PLEURAL OR PERITONEAL FLUID: Amylase, Fluid: 20 U/L

## 2019-10-16 LAB — LACTATE DEHYDROGENASE, PLEURAL OR PERITONEAL FLUID: LD, Fluid: 3687 U/L — ABNORMAL HIGH (ref 3–23)

## 2019-10-16 LAB — BASIC METABOLIC PANEL
Anion gap: 10 (ref 5–15)
BUN: 17 mg/dL (ref 6–20)
CO2: 24 mmol/L (ref 22–32)
Calcium: 9 mg/dL (ref 8.9–10.3)
Chloride: 102 mmol/L (ref 98–111)
Creatinine, Ser: 0.78 mg/dL (ref 0.44–1.00)
GFR calc Af Amer: >60 mL/min (ref >60)
GFR calc non Af Amer: >60 mL/min (ref >60)
Glucose, Bld: 200 mg/dL — ABNORMAL HIGH (ref 70–99)
Potassium: 4.5 mmol/L (ref 3.5–5.1)
Sodium: 136 mmol/L (ref 135–145)

## 2019-10-16 LAB — ALBUMIN, PLEURAL OR PERITONEAL FLUID: Albumin, Fluid: 1.9 g/dL

## 2019-10-16 LAB — LACTATE DEHYDROGENASE: LDH: 423 U/L — ABNORMAL HIGH (ref 98–192)

## 2019-10-16 LAB — BODY FLUID CELL COUNT WITH DIFFERENTIAL
Lymphs, Fluid: 37 %
Monocyte-Macrophage-Serous Fluid: 13 % — ABNORMAL LOW (ref 50–90)
Neutrophil Count, Fluid: 50 % — ABNORMAL HIGH (ref 0–25)
Total Nucleated Cell Count, Fluid: 143 cu mm (ref 0–1000)

## 2019-10-16 LAB — ALBUMIN: Albumin: 2.6 g/dL — ABNORMAL LOW (ref 3.5–5.0)

## 2019-10-16 LAB — SARS CORONAVIRUS 2 BY RT PCR (HOSPITAL ORDER, PERFORMED IN ~~LOC~~ HOSPITAL LAB): SARS Coronavirus 2: NEGATIVE

## 2019-10-16 LAB — CREATININE, SERUM
Creatinine, Ser: 0.62 mg/dL (ref 0.44–1.00)
GFR calc Af Amer: >60 mL/min (ref >60)
GFR calc non Af Amer: >60 mL/min (ref >60)

## 2019-10-16 LAB — PROTEIN, PLEURAL OR PERITONEAL FLUID: Total protein, fluid: 3.8 g/dL

## 2019-10-16 LAB — PROCALCITONIN: Procalcitonin: <0.1 ng/mL

## 2019-10-16 LAB — PROTEIN, TOTAL: Total Protein: 5.9 g/dL — ABNORMAL LOW (ref 6.5–8.1)

## 2019-10-16 LAB — GLUCOSE, CAPILLARY: Glucose-Capillary: 198 mg/dL — ABNORMAL HIGH (ref 70–99)

## 2019-10-16 LAB — LACTIC ACID, PLASMA: Lactic Acid, Venous: 1.7 mmol/L (ref 0.5–1.9)

## 2019-10-16 LAB — TROPONIN I (HIGH SENSITIVITY): Troponin I (High Sensitivity): 6 ng/L (ref <18)

## 2019-10-16 LAB — HIV ANTIBODY (ROUTINE TESTING W REFLEX): HIV Screen 4th Generation wRfx: NONREACTIVE

## 2019-10-16 MED ORDER — SODIUM CHLORIDE 0.9 % IV SOLN
2.0000 g | Freq: Three times a day (TID) | INTRAVENOUS | Status: DC
  Administered 2019-10-16 – 2019-10-17 (×2): 2 g via INTRAVENOUS
  Filled 2019-10-16 (×3): qty 2

## 2019-10-16 MED ORDER — FENTANYL CITRATE (PF) 100 MCG/2ML IJ SOLN
50.0000 ug | Freq: Once | INTRAMUSCULAR | Status: AC
  Administered 2019-10-16: 50 ug via INTRAVENOUS
  Filled 2019-10-16: qty 2

## 2019-10-16 MED ORDER — SODIUM CHLORIDE 0.9 % IV SOLN
2.0000 g | Freq: Once | INTRAVENOUS | Status: AC
  Administered 2019-10-16: 2 g via INTRAVENOUS
  Filled 2019-10-16: qty 2

## 2019-10-16 MED ORDER — SODIUM CHLORIDE 0.9% FLUSH
10.0000 mL | INTRAVENOUS | Status: DC | PRN
  Administered 2019-10-19: 20 mL
  Administered 2019-10-21: 10 mL

## 2019-10-16 MED ORDER — IOHEXOL 350 MG/ML SOLN: 100.0000 mL | Freq: Once | INTRAVENOUS | Status: DC | PRN

## 2019-10-16 MED ORDER — INSULIN ASPART 100 UNIT/ML ~~LOC~~ SOLN
0.0000 [IU] | SUBCUTANEOUS | Status: DC
  Administered 2019-10-16 – 2019-10-17 (×2): 3 [IU] via SUBCUTANEOUS
  Administered 2019-10-17: 5 [IU] via SUBCUTANEOUS
  Administered 2019-10-17: 2 [IU] via SUBCUTANEOUS
  Administered 2019-10-17 (×2): 3 [IU] via SUBCUTANEOUS
  Administered 2019-10-18: 2 [IU] via SUBCUTANEOUS
  Administered 2019-10-18: 3 [IU] via SUBCUTANEOUS
  Administered 2019-10-18 – 2019-10-19 (×5): 2 [IU] via SUBCUTANEOUS
  Administered 2019-10-19 (×2): 3 [IU] via SUBCUTANEOUS
  Administered 2019-10-19 – 2019-10-20 (×2): 2 [IU] via SUBCUTANEOUS
  Administered 2019-10-20: 3 [IU] via SUBCUTANEOUS
  Administered 2019-10-20 – 2019-10-21 (×4): 2 [IU] via SUBCUTANEOUS
  Administered 2019-10-21: 3 [IU] via SUBCUTANEOUS
  Administered 2019-10-21: 2 [IU] via SUBCUTANEOUS
  Administered 2019-10-22 – 2019-10-23 (×4): 3 [IU] via SUBCUTANEOUS
  Administered 2019-10-24: 2 [IU] via SUBCUTANEOUS
  Administered 2019-10-24: 3 [IU] via SUBCUTANEOUS
  Administered 2019-10-24: 2 [IU] via SUBCUTANEOUS
  Administered 2019-10-24: 3 [IU] via SUBCUTANEOUS
  Administered 2019-10-24: 2 [IU] via SUBCUTANEOUS
  Administered 2019-10-24 – 2019-10-25 (×2): 3 [IU] via SUBCUTANEOUS
  Administered 2019-10-25 – 2019-10-26 (×6): 2 [IU] via SUBCUTANEOUS
  Administered 2019-10-26: 3 [IU] via SUBCUTANEOUS
  Administered 2019-10-27 (×2): 2 [IU] via SUBCUTANEOUS
  Administered 2019-10-27: 3 [IU] via SUBCUTANEOUS
  Administered 2019-10-27 – 2019-10-28 (×3): 2 [IU] via SUBCUTANEOUS
  Administered 2019-10-28: 3 [IU] via SUBCUTANEOUS
  Administered 2019-10-28 – 2019-10-30 (×9): 2 [IU] via SUBCUTANEOUS
  Administered 2019-10-30: 3 [IU] via SUBCUTANEOUS
  Administered 2019-10-30 (×2): 2 [IU] via SUBCUTANEOUS
  Administered 2019-10-30 – 2019-10-31 (×2): 3 [IU] via SUBCUTANEOUS
  Administered 2019-10-31 (×3): 2 [IU] via SUBCUTANEOUS
  Administered 2019-10-31: 3 [IU] via SUBCUTANEOUS
  Administered 2019-11-01 (×3): 2 [IU] via SUBCUTANEOUS
  Administered 2019-11-01: 3 [IU] via SUBCUTANEOUS
  Administered 2019-11-01 – 2019-11-02 (×5): 2 [IU] via SUBCUTANEOUS
  Administered 2019-11-02: 3 [IU] via SUBCUTANEOUS
  Administered 2019-11-03 – 2019-11-04 (×10): 2 [IU] via SUBCUTANEOUS
  Administered 2019-11-05: 3 [IU] via SUBCUTANEOUS
  Administered 2019-11-05 – 2019-11-06 (×5): 2 [IU] via SUBCUTANEOUS
  Administered 2019-11-06: 3 [IU] via SUBCUTANEOUS
  Administered 2019-11-06 (×2): 2 [IU] via SUBCUTANEOUS
  Administered 2019-11-06: 4 [IU] via SUBCUTANEOUS
  Administered 2019-11-07 (×2): 2 [IU] via SUBCUTANEOUS
  Administered 2019-11-07 – 2019-11-09 (×4): 3 [IU] via SUBCUTANEOUS
  Administered 2019-11-09: 1 [IU] via SUBCUTANEOUS
  Administered 2019-11-09: 3 [IU] via SUBCUTANEOUS
  Administered 2019-11-09: 5 [IU] via SUBCUTANEOUS
  Administered 2019-11-09: 3 [IU] via SUBCUTANEOUS
  Administered 2019-11-10: 2 [IU] via SUBCUTANEOUS
  Administered 2019-11-10: 3 [IU] via SUBCUTANEOUS
  Administered 2019-11-10: 2 [IU] via SUBCUTANEOUS
  Administered 2019-11-10 – 2019-11-11 (×3): 3 [IU] via SUBCUTANEOUS
  Administered 2019-11-11: 5 [IU] via SUBCUTANEOUS
  Administered 2019-11-11: 2 [IU] via SUBCUTANEOUS
  Administered 2019-11-11: 5 [IU] via SUBCUTANEOUS
  Administered 2019-11-11 – 2019-11-12 (×3): 3 [IU] via SUBCUTANEOUS
  Administered 2019-11-12: 5 [IU] via SUBCUTANEOUS
  Administered 2019-11-12: 8 [IU] via SUBCUTANEOUS
  Administered 2019-11-12 – 2019-11-13 (×3): 2 [IU] via SUBCUTANEOUS
  Administered 2019-11-13: 8 [IU] via SUBCUTANEOUS
  Filled 2019-10-16: qty 0.15

## 2019-10-16 MED ORDER — IPRATROPIUM-ALBUTEROL 0.5-2.5 (3) MG/3ML IN SOLN: 3.0000 mL | Freq: Four times a day (QID) | RESPIRATORY_TRACT | Status: DC

## 2019-10-16 MED ORDER — CHLORHEXIDINE GLUCONATE CLOTH 2 % EX PADS
6.0000 | MEDICATED_PAD | Freq: Every day | CUTANEOUS | Status: DC
  Administered 2019-10-17 – 2019-10-22 (×6): 6 via TOPICAL

## 2019-10-16 MED ORDER — LEVALBUTEROL HCL 1.25 MG/0.5ML IN NEBU
1.2500 mg | INHALATION_SOLUTION | Freq: Four times a day (QID) | RESPIRATORY_TRACT | Status: DC
  Administered 2019-10-16: 1.25 mg via RESPIRATORY_TRACT
  Filled 2019-10-16: qty 0.5

## 2019-10-16 MED ORDER — METOPROLOL TARTRATE 5 MG/5ML IV SOLN: 5.0000 mg | Freq: Three times a day (TID) | INTRAVENOUS | Status: DC | PRN

## 2019-10-16 MED ORDER — METOPROLOL TARTRATE 25 MG PO TABS
12.5000 mg | ORAL_TABLET | Freq: Two times a day (BID) | ORAL | Status: DC
  Administered 2019-10-16 – 2019-10-17 (×2): 12.5 mg via ORAL
  Filled 2019-10-16 (×2): qty 1

## 2019-10-16 MED ORDER — ENOXAPARIN SODIUM 30 MG/0.3ML ~~LOC~~ SOLN
30.0000 mg | Freq: Two times a day (BID) | SUBCUTANEOUS | Status: DC
  Administered 2019-10-16 – 2019-10-17 (×3): 30 mg via SUBCUTANEOUS
  Filled 2019-10-16 (×2): qty 0.3

## 2019-10-16 MED ORDER — VANCOMYCIN HCL IN DEXTROSE 1-5 GM/200ML-% IV SOLN
1000.0000 mg | Freq: Once | INTRAVENOUS | Status: AC
  Administered 2019-10-16: 1000 mg via INTRAVENOUS
  Filled 2019-10-16: qty 200

## 2019-10-16 MED ORDER — LEVALBUTEROL HCL 1.25 MG/0.5ML IN NEBU
1.2500 mg | INHALATION_SOLUTION | Freq: Two times a day (BID) | RESPIRATORY_TRACT | Status: DC
  Administered 2019-10-17 – 2019-10-24 (×16): 1.25 mg via RESPIRATORY_TRACT
  Filled 2019-10-16 (×16): qty 0.5

## 2019-10-16 MED ORDER — ONDANSETRON HCL 4 MG/2ML IJ SOLN
4.0000 mg | Freq: Once | INTRAMUSCULAR | Status: AC
  Administered 2019-10-16: 4 mg via INTRAVENOUS
  Filled 2019-10-16: qty 2

## 2019-10-16 MED ORDER — SODIUM CHLORIDE 0.9% FLUSH
10.0000 mL | Freq: Two times a day (BID) | INTRAVENOUS | Status: DC
  Administered 2019-10-17: 10 mL
  Administered 2019-10-18: 20 mL
  Administered 2019-10-21: 10 mL

## 2019-10-16 NOTE — ED Notes (Signed)
Pt ambulatory to bathroom, standby assistance.  

## 2019-10-16 NOTE — Procedures (Signed)
Central Venous Catheter Insertion Procedure Note EVELINA LORE [id number] [birth year]  Procedure: Insertion of Central Venous Catheter Indications: Drug and/or fluid administration and Frequent blood sampling  Procedure Details Consent: Risks of procedure as well as the alternatives and risks of each were explained to the (patient/caregiver).  Consent for procedure obtained. Time Out: Verified patient identification, verified procedure, site/side was marked, verified correct patient position, special equipment/implants available, medications/allergies/relevent history reviewed, required imaging and test results available.  Performed Real time [place] used to ID and cannulate vessel  Maximum sterile technique was used including antiseptics, cap, gloves, gown, hand hygiene, mask and sheet. Skin prep: Chlorhexidine; local anesthetic administered A antimicrobial bonded/coated triple lumen catheter was placed in the left internal jugular vein using the Seldinger technique.  Evaluation Blood flow good Complications: No apparent complications Patient did tolerate procedure well. Chest X-ray ordered to verify placement.  CXR: pending.  Clementeen [place] 10/16/2019, 3:10 PM  Erick Colace ACNP-BC [place] Pager # [phone] OR # [phone] if no answer

## 2019-10-16 NOTE — ED Notes (Signed)
pleurx drain attached to pts catheter per MD Sherral Hammers verbal order, new dressing placed.

## 2019-10-16 NOTE — Progress Notes (Signed)
L arm assessed with [place], site swollen, tight and slightly reddish in color, due to previous unsuccessful CT with contrast. Veins not compressing,also visualized small veins which are not suitable for PIV nor a midline placement.. Bedside RN made aware. Recommended central line.RN will notify MD.

## 2019-10-16 NOTE — Telephone Encounter (Signed)
Scheduled appt per 6/9 sch message - unable to reach pt . Pt should get updated schedule on discharge papers.

## 2019-10-16 NOTE — Progress Notes (Signed)
Pharmacy Antibiotic Note  Autumn Cook is a 59 y.o. female admitted on 10/16/2019 with history of breast cancer, hypertension, diabetes, asthma who presents with SOB  .  Pharmacy has been consulted for cefepime dosing for HCAP.  Plan: Cefepime 2gm IV q8h Follow renal function, cultures and clinical course  Height: 5\' 6"  (167.6 cm) Weight: 80.3 kg (177 lb) IBW/kg (Calculated) : 59.3  Temp (24hrs), Avg:98.8 F (37.1 C), Min:98.7 F (37.1 C), Max:98.8 F (37.1 C)  Recent Labs  Lab 10/09/19 1554 10/09/19 1701 10/10/19 0512 10/10/19 0512 10/11/19 0454 10/12/19 0459 10/13/19 0600 10/14/19 0242 10/16/19 0043  WBC   < >  --  9.8  --   --  9.2 10.9* 8.8 7.6  CREATININE   < >  --  1.35*   < > 0.94 0.78 0.72 0.87 0.78  LATICACIDVEN  --  1.7  --   --   --   --   --   --   --    < > = values in this interval not displayed.    Estimated Creatinine Clearance: 82.9 mL/min (by C-G formula based on SCr of 0.78 mg/dL).    Allergies  Allergen Reactions  . Penicillins Hives    Did it involve swelling of the face/tongue/throat, SOB, or low BP? Yes--syncope episode Did it involve sudden or severe rash/hives, skin peeling, or any reaction on the inside of your mouth or nose? No Did you need to seek medical attention at a hospital or doctor's office? Yes When did it last happen? 37-38 YEARS AGO If all above answers are "NO", may proceed with cephalosporin use.   . Metformin And Related Diarrhea  . Adhesive [Tape] Other (See Comments)    "severe bruising"  ALSO SKIN GLUE  . Asa [Aspirin] Other (See Comments)    GI upset   . Mango Flavor Hives  . Olive Tree Hives    Pt tolerates olive oil      Thank you for allowing pharmacy to be a part of this patient's care.  Dolly Rias RPh 10/16/2019, 8:22 AM

## 2019-10-16 NOTE — ED Notes (Signed)
Per IV team RN- PICC RN aware of pts order for PICC placement.

## 2019-10-16 NOTE — ED Notes (Signed)
This RN tried to call report to 4W, was told charge nurse is still working on assignments to give them more time.

## 2019-10-16 NOTE — Telephone Encounter (Signed)
When opening pt's chart today to make 2nd attempt for [facility] follow up call, it appears per Epic that she is currently in the ED. Will forward to PCP.

## 2019-10-16 NOTE — H&P (Signed)
[facility] History and Physical  DELFINA SCHREURS VPX:[id number] DOB: 1960/06/07 DOA: 10/16/2019  Referring physician:  PCP: Darreld Mclean, MD   Chief Complaint:   HPI: NEKEISHA AURE is a 59 y.o. BF PMHx RIGHT Breast cancer, HTN, HLD, DM type II uncontrolled with complication,, asthma  who presents to the emergency department with complaints of central sharp chest pain that started yesterday as well as shortness of breath.  She reports she has had night sweats but no known fevers or chills.  She denies any cough.  She was recently admitted to the hospital and discharged 2 days ago after she was found to have a large left pleural effusion.  She had a thoracentesis and had a Pleurx catheter placed.  She states it is still in place but no one came up to her house after discharge to show her how to drain it at home.  No calf tenderness or calf swelling.  States she has not yet on chemotherapy.  She was admitted [facility] prior to her arrival and left without being seen.   Review of Systems:  Covid vaccination; positive vaccination  Constitutional:  No weight loss, night sweats, positive fevers, chills, fatigue.  HEENT:  No headaches, Difficulty swallowing,Tooth/dental problems,Sore throat,  No sneezing, itching, ear ache, nasal congestion, post nasal drip,  Cardio-vascular:  No chest pain, Orthopnea, PND, swelling in lower extremities, anasarca, dizziness, palpitations  GI:  No heartburn, indigestion, abdominal pain, nausea, vomiting, diarrhea, change in bowel habits, loss of appetite  Resp:  positive shortness of breath with exertion or at rest. No excess mucus, no productive cough, positive non-productive cough, No coughing up of blood.No change in color of mucus.No wheezing.No chest wall deformity  Skin:  no rash or lesions.  GU:  no dysuria, change in color of urine, no urgency or frequency. No flank pain.  Musculoskeletal:  No joint pain or swelling. No  decreased range of motion. No back pain.  Psych:  No change in mood or affect. No depression or anxiety. No memory loss.   Past Medical History:  Diagnosis Date  . Allergy   . Arthritis   . Asthma   . Cancer [facility])    R breast cancer  . Diabetes mellitus without complication (HCC)    no meds now  . Family history of adverse reaction to anesthesia    "entire family takes a long time to wake up- N&V for several days"  . Family history of lung cancer   . Hypertension   . Migraine   . PONV (postoperative nausea and vomiting)    Past Surgical History:  Procedure Laterality Date  . BREAST CYST EXCISION  1994  . BREAST LUMPECTOMY WITH RADIOACTIVE SEED AND SENTINEL LYMPH NODE BIOPSY Right 12/27/2018   Procedure: RIGHT BREAST LUMPECTOMY WITH RADIOACTIVE SEED X2 AND RIGHT SEED TARGETED AXILLARY SENTINEL LYMPH NODE BIOPSY;  Surgeon: Alphonsa Overall, MD;  Location: [place];  Service: General;  Laterality: Right;  . DILATION AND CURETTAGE OF UTERUS    . PORTACATH PLACEMENT Right 08/01/2018   Procedure: INSERTION PORT-A-CATH WITH ULTRASOUND;  Surgeon: Alphonsa Overall, MD;  Location: [place];  Service: General;  Laterality: Right;   Social History:  reports that she quit smoking about 31 years ago. She has a 5.00 pack-year smoking history. She has never used smokeless tobacco. She reports current alcohol use. She reports that she does not use drugs.  Allergies  Allergen Reactions  . Penicillins Hives  Did it involve swelling of the face/tongue/throat, SOB, or low BP? Yes--syncope episode Did it involve sudden or severe rash/hives, skin peeling, or any reaction on the inside of your mouth or nose? No Did you need to seek medical attention at a hospital or doctor's office? Yes When did it last happen? 37-38 YEARS AGO If all above answers are "NO", may proceed with cephalosporin use.   . Metformin And Related Diarrhea  . Adhesive [Tape] Other (See Comments)     "severe bruising"  ALSO SKIN GLUE  . Asa [Aspirin] Other (See Comments)    GI upset   . Mango Flavor Hives  . Olive Tree Hives    Pt tolerates olive oil    Family History  Problem Relation Age of Onset  . Hypertension Father   . Cancer Father 37       lung cancer  . Stroke Father   . Heart disease Mother        arrhythmia  . Hypertension Mother   . Kidney failure Sister      Prior to Admission medications   Medication Sig Start Date End Date Taking? Authorizing Provider  acetaminophen (TYLENOL) 500 MG tablet Take 1 tablet (500 mg total) by mouth 3 (three) times daily. 10/14/19   Arrien, Jimmy Picket, MD  albuterol (VENTOLIN HFA) 108 (90 Base) MCG/ACT inhaler Inhale 2 puffs into the lungs every 4 (four) hours as needed for wheezing or shortness of breath (cough, shortness of breath or wheezing.). 10/15/18   Gardenia Phlegm, NP  atenolol (TENORMIN) 100 MG tablet TAKE 1 TABLET BY MOUTH EVERY DAY 06/19/19   Copland, Gay Filler, MD  blood glucose meter kit and supplies KIT Dispense based on patient and insurance preference. Use up to four times daily as directed. (FOR ICD-9 250.00, 250.01). 03/07/16   Charlesetta Shanks, MD  capecitabine (XELODA) 500 MG tablet Take 3 tablets (1,500 mg total) by mouth 2 (two) times daily after a meal. Take daily for 14 days, then do not take for 7 days, then repeat. 10/14/19   Magrinat, Virgie Dad, MD  fluticasone-salmeterol (ADVAIR HFA) [phone] MCG/ACT inhaler Inhale 2 puffs into the lungs 2 (two) times daily. 10/02/19   Tanner, Lyndon Code., PA-C  glucose blood (ACCU-CHEK ACTIVE STRIPS) test strip Use as instructed- check blood sugar up to 2x a day 08/30/18   Copland, Gay Filler, MD  guaiFENesin-codeine 100-10 MG/5ML syrup Take 5 mLs by mouth every 4 (four) hours as needed for cough. 10/02/19   Tanner, Lyndon Code., PA-C  guaiFENesin-dextromethorphan (ROBITUSSIN DM) 100-10 MG/5ML syrup Take 5 mLs by mouth every 6 (six) hours as needed for cough. 10/14/19   Arrien,  Jimmy Picket, MD  ibuprofen (ADVIL) 200 MG tablet Take 1 tablet (200 mg total) by mouth every 8 (eight) hours as needed for moderate pain. 10/14/19   Arrien, Jimmy Picket, MD  pantoprazole (PROTONIX) 40 MG tablet Take 1 tablet (40 mg total) by mouth daily. 10/15/19 11/14/19  Arrien, Jimmy Picket, MD  traMADol (ULTRAM) 50 MG tablet Take 1 tablet (50 mg total) by mouth every 6 (six) hours as needed for severe pain. 10/14/19   Arrien, Jimmy Picket, MD  Vitamin D, Ergocalciferol, (DRISDOL) 1.25 MG (50000 UNIT) CAPS capsule TAKE 1 CAPSULE BY MOUTH ONE TIME PER WEEK Patient taking differently: Take 50,000 Units by mouth every Saturday.  09/30/19   Copland, Gay Filler, MD     Consultants:  PCCM   Procedures/Significant Events:  6/10 CXR;Complete opacification of the left hemithorax  with nonvisualization of the previously seen aerated portion of the left upper lung, likely reflecting progressive atelectatic collapse and accumulation of pleural fluid. 2. Increasingly consolidative opacity in the right lung base with air bronchograms. Could reflect acute infection or inflammation, possibly pneumonia or sequela of aspiration.    I have personally reviewed and interpreted all radiology studies and my findings are as above.   VENTILATOR SETTINGS:    Cultures 6/10 blood pending 6/10 SARS coronavirus negative   Antimicrobials: Anti-infectives (From admission, onward)   Start     Ordered Stop   10/16/19 1400  ceFEPIme (MAXIPIME) 2 g in sodium chloride 0.9 % 100 mL IVPB     Discontinue     10/16/19 0817     10/16/19 0530  vancomycin (VANCOCIN) IVPB 1000 mg/200 mL premix        10/16/19 0520 10/16/19 0741   10/16/19 0530  ceFEPIme (MAXIPIME) 2 g in sodium chloride 0.9 % 100 mL IVPB        10/16/19 0520 10/16/19 0741       Devices    LINES / TUBES:  LEFT Pleurx cath>>>    Continuous Infusions:  Physical Exam: Vitals:   10/16/19 0431  BP: (!) 143/95  Pulse: 90  Resp: 17    Temp: 98.8 F (37.1 C)  TempSrc: Oral  SpO2: 100%  Weight: 80.3 kg  Height: 5' 6"  (1.676 m)    Wt Readings from Last 3 Encounters:  10/16/19 80.3 kg  10/15/19 79.8 kg  10/09/19 80.4 kg    General: A/O x4, positive acute respiratory distress Eyes: negative scleral hemorrhage, negative anisocoria, negative icterus ENT: Negative Runny nose, negative gingival bleeding, Neck:  Negative scars, masses, torticollis, lymphadenopathy, JVD Lungs: Tachypneic absent lung sounds on the left, decreased breath sounds on right, wheezes or crackles Cardiovascular: Regular rate and rhythm without murmur gallop or rub normal S1 and S2 Abdomen: negative abdominal pain, nondistended, positive soft, bowel sounds, no rebound, no ascites, no appreciable mass Extremities: No significant cyanosis, clubbing, or edema bilateral lower extremities Skin: Negative rashes, lesions, ulcers Psychiatric:  Negative depression, negative anxiety, negative fatigue, negative mania  Central nervous system:  Cranial nerves II through XII intact, tongue/uvula midline, all extremities muscle strength 5/5, sensation intact throughout, negative dysarthria, negative expressive aphasia, negative receptive aphasia.        Labs on Admission:  Basic Metabolic Panel: Recent Labs  Lab 10/11/19 0454 10/12/19 0459 10/13/19 0600 10/14/19 0242 10/16/19 0043  NA 136 138 137 138 136  K 3.9 4.2 4.3 4.2 4.5  CL 103 103 102 104 102  CO2 25 22 21* 23 24  GLUCOSE 196* 236* 291* 174* 200*  BUN 28* 20 17 20 17   CREATININE 0.94 0.78 0.72 0.87 0.78  CALCIUM 8.9 8.7* 9.4 9.1 9.0   Liver Function Tests: No results for input(s): AST, ALT, ALKPHOS, BILITOT, PROT, ALBUMIN in the last 168 hours. No results for input(s): LIPASE, AMYLASE in the last 168 hours. No results for input(s): AMMONIA in the last 168 hours. CBC: Recent Labs  Lab 10/09/19 1554 10/09/19 1554 10/10/19 0512 10/12/19 0459 10/13/19 0600 10/14/19 0242 10/16/19 0043   WBC 8.7   < > 9.8 9.2 10.9* 8.8 7.6  NEUTROABS 6.5  --   --  7.4 8.9* 6.7 5.9  HGB 12.8   < > 11.8* 10.8* 11.4* 10.7* 10.7*  HCT 38.4   < > 35.5* 33.0* 34.6* 32.4* 32.8*  MCV 88.1   < > 89.0 88.9 89.9 88.8  89.1  PLT 463*   < > 396 327 401* 339 375   < > = values in this interval not displayed.   Cardiac Enzymes: No results for input(s): CKTOTAL, CKMB, CKMBINDEX, TROPONINI in the last 168 hours.  BNP (last 3 results) No results for input(s): BNP in the last 8760 hours.  ProBNP (last 3 results) No results for input(s): PROBNP in the last 8760 hours.  CBG: Recent Labs  Lab 10/13/19 1214 10/13/19 1655 10/13/19 2124 10/14/19 0738 10/14/19 1123  GLUCAP 251* 186* 132* 159* 203*    Radiological Exams on Admission: DG Chest 2 View  Result Date: 10/16/2019 CLINICAL DATA:  Chest pain, thoracentesis and chest tube insertion 2 days prior EXAM: CHEST - 2 VIEW COMPARISON:  Radiograph 10/14/2019 FINDINGS: Now complete opacification of the left hemithorax with nonvisualization of the previously seen aerated portion of the left upper lung likely reflecting progressive atelectatic collapse and accumulation of pleural fluid. There is some increasingly consolidative opacity in the right lung base with air bronchograms. No visible pneumothorax or right effusion. Cardiomediastinal contours are largely obscured. The right heart border is grossly unchanged from prior. No acute osseous or soft tissue abnormality. Degenerative changes are present in the imaged spine and shoulders. IMPRESSION: 1. Complete opacification of the left hemithorax with nonvisualization of the previously seen aerated portion of the left upper lung, likely reflecting progressive atelectatic collapse and accumulation of pleural fluid. 2. Increasingly consolidative opacity in the right lung base with air bronchograms. Could reflect acute infection or inflammation, possibly pneumonia or sequela of aspiration. Electronically Signed   By:  Lovena Le M.D.   On: 10/16/2019 00:12   DG Chest 2 View  Result Date: 10/14/2019 CLINICAL DATA:  Left-sided pleural effusion EXAM: CHEST - 2 VIEW COMPARISON:  10/13/2019 FINDINGS: Left chest tube remains in place. The degree of aeration in the left lung has decreased in the interval from the prior exam. The degree of pleural fluid appears similar. Right lung remains clear. No bony abnormality is noted. IMPRESSION: Chest tube remains in place. The degree of aeration in the left lung has decreased in the interval from the prior exam. Electronically Signed   By: Inez Catalina M.D.   On: 10/14/2019 14:30    EKG: Independently reviewed.   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 oncology courtesy call in the a.m. and let them know that patient has been readmitted. -Flutter valve -Incentive spirometry -Xopenex QID  Acute on Chronic Respiratory failure with hypoxia -Patient on 2 L O2 at home at baseline. -Over the last several days unsure how high she had to increase O2 as her daughter (caregiver) operated machine.  Was feeling short of breath. -Prior to discharge LCSW/NCM MUST become involved to ensure no repeat of this incident.  Patient and caregiver unsure of how to operate Pleurx cath. -Ensure patient has follow-up with PCCM outpatient clinic prior to discharge  Malignant pleural effusion -620m serosanguineous fluid withdrawn through Pleurx cath today.  Labs pending  Malignant neoplasm of RIGHT breast -Per patient has not  started  chemotherapy yet, [name] has ordered medication which is supposed to arrive next week.  Essential HTN -Metoprolol 12.5 mg BID -Metoprolol IV 5 mg PRN   DM type II uncontrolled with complication -6/5 hemoglobin A1c= 7.8 -Moderate SSI  HLD -Lipid panel pending -Currently not on any home medication  Goals of care -6/9 palliative care consult; patient with metastatic breast cancer evaluate for minimum CODE STATUS change discuss hospice wishes.  Patient already having frequent malignant pleural effusion requiring Pleurx cath placement       Code Status: Full (DVT Prophylaxis:Full dose Lovenox Family Communication: 6/9 son present at bedside for procedure and discussion of plan of care  Status is: Inpatient    Dispo: The patient is from: Home              Anticipated d/c is to: Home              Anticipated d/c date is: 6/15              Patient currently unstable     Data Reviewed: Care during the described time interval was provided by me .  I have reviewed this patient's available data, including medical history, events of note, physical examination, and all test results as part of my evaluation.   The patient is critically ill with multiple organ systems failure and requires high complexity decision making for assessment and support, frequent evaluation and titration of therapies, application of advanced monitoring technologies and extensive interpretation of multiple databases. Critical Care Time devoted to patient care services described in this note  Time spent: 54 minutes   Haydin Calandra, [facility] Pager [phone]

## 2019-10-16 NOTE — ED Notes (Signed)
Pleurx drain disconnected, appx 650 mL red tinged fluid collected in container. Fluid transported to lab for testing.

## 2019-10-16 NOTE — ED Notes (Signed)
  Patient given new ice pack to L arm to comfort infiltration area from CT contrast.  Patient given ice water and repositioned in bed.

## 2019-10-16 NOTE — ED Notes (Addendum)
°  Patient states she would like to leave without being seen and plans to go to [facility].  Patient placed in wheelchair and placed on home O2 concentrator.  Patient wheeled outside to daughters car and assisted into car.

## 2019-10-16 NOTE — ED Notes (Signed)
  Patient still upset with staff.  Spoke with Crystal AD and informed her of the situation.  Crystal going to speak with patient and family.

## 2019-10-16 NOTE — ED Notes (Addendum)
  Patient came back from CT upset with CT tech.  Patient states she was getting IV contrast and stated it was burning and told CT tech to stop.  IV infiltrated with the CT contrast and is swollen and painful to touch.  L arm wrapped with ice packs.  Patient upset and stating she would like to leave AMA and that she does not want [place] to touch her anymore.

## 2019-10-16 NOTE — Progress Notes (Signed)
At beside to place PICC, unable to use right arm due to lymph nodes removed per patient conversation. Assessed left upper arm for placement of PICC, no veins large enough due to CT contrast infiltration early. Left arm remains swollen and tender to touch. RN aware and has secure chatted Dr. Sherral Hammers. Will continue to monitor.

## 2019-10-16 NOTE — ED Notes (Signed)
PICC team unable to place PICC line at this time. PICC team recommends central line. Dr. Sherral Hammers aware.

## 2019-10-16 NOTE — ED Notes (Signed)
  Patient states she would like to be transferred to [facility] instead of leaving AMA.  Spoke with EDP and patient can not be transported to another hospital without being seen.  Patient made aware and upset.

## 2019-10-16 NOTE — ED Provider Notes (Signed)
TIME SEEN: 5:10 AM  CHIEF COMPLAINT: Sharp central chest pain, shortness of breath  HPI: Patient is a 59 year old female with history of breast cancer, hypertension, diabetes, asthma who presents to the emergency department with complaints of central sharp chest pain that started yesterday as well as shortness of breath.  She reports she has had night sweats but no known fevers or chills.  She denies any cough.  She was recently admitted to the hospital and discharged 2 days ago after she was found to have a large left pleural effusion.  She had a thoracentesis and had a Pleurx catheter placed.  She states it is still in place but no one came up to her house after discharge to show her how to drain it at home.  No calf tenderness or calf swelling.  States she has not yet on chemotherapy.  She was admitted [facility] prior to her arrival and left without being seen.  ROS: See HPI Constitutional: no fever  Eyes: no drainage  ENT: no runny nose   Cardiovascular:   chest pain  Resp:  SOB  GI: no vomiting GU: no dysuria Integumentary: no rash  Allergy: no hives  Musculoskeletal: no leg swelling  Neurological: no slurred speech ROS otherwise negative  PAST MEDICAL HISTORY/PAST SURGICAL HISTORY:  Past Medical History:  Diagnosis Date  . Allergy   . Arthritis   . Asthma   . Cancer [facility])    R breast cancer  . Diabetes mellitus without complication (HCC)    no meds now  . Family history of adverse reaction to anesthesia    "entire family takes a long time to wake up- N&V for several days"  . Family history of lung cancer   . Hypertension   . Migraine   . PONV (postoperative nausea and vomiting)     MEDICATIONS:  Prior to Admission medications   Medication Sig Start Date End Date Taking? Authorizing Provider  acetaminophen (TYLENOL) 500 MG tablet Take 1 tablet (500 mg total) by mouth 3 (three) times daily. 10/14/19   Arrien, Jimmy Picket, MD  albuterol (VENTOLIN HFA) 108 (90  Base) MCG/ACT inhaler Inhale 2 puffs into the lungs every 4 (four) hours as needed for wheezing or shortness of breath (cough, shortness of breath or wheezing.). 10/15/18   Gardenia Phlegm, NP  atenolol (TENORMIN) 100 MG tablet TAKE 1 TABLET BY MOUTH EVERY DAY 06/19/19   Copland, Gay Filler, MD  blood glucose meter kit and supplies KIT Dispense based on patient and insurance preference. Use up to four times daily as directed. (FOR ICD-9 250.00, 250.01). 03/07/16   Charlesetta Shanks, MD  capecitabine (XELODA) 500 MG tablet Take 3 tablets (1,500 mg total) by mouth 2 (two) times daily after a meal. Take daily for 14 days, then do not take for 7 days, then repeat. 10/14/19   Magrinat, Virgie Dad, MD  fluticasone-salmeterol (ADVAIR HFA) [phone] MCG/ACT inhaler Inhale 2 puffs into the lungs 2 (two) times daily. 10/02/19   Tanner, Lyndon Code., PA-C  glucose blood (ACCU-CHEK ACTIVE STRIPS) test strip Use as instructed- check blood sugar up to 2x a day 08/30/18   Copland, Gay Filler, MD  guaiFENesin-codeine 100-10 MG/5ML syrup Take 5 mLs by mouth every 4 (four) hours as needed for cough. 10/02/19   Tanner, Lyndon Code., PA-C  guaiFENesin-dextromethorphan (ROBITUSSIN DM) 100-10 MG/5ML syrup Take 5 mLs by mouth every 6 (six) hours as needed for cough. 10/14/19   Arrien, Jimmy Picket, MD  ibuprofen (ADVIL)  200 MG tablet Take 1 tablet (200 mg total) by mouth every 8 (eight) hours as needed for moderate pain. 10/14/19   Arrien, Jimmy Picket, MD  pantoprazole (PROTONIX) 40 MG tablet Take 1 tablet (40 mg total) by mouth daily. 10/15/19 11/14/19  Arrien, Jimmy Picket, MD  traMADol (ULTRAM) 50 MG tablet Take 1 tablet (50 mg total) by mouth every 6 (six) hours as needed for severe pain. 10/14/19   Arrien, Jimmy Picket, MD  Vitamin D, Ergocalciferol, (DRISDOL) 1.25 MG (50000 UNIT) CAPS capsule TAKE 1 CAPSULE BY MOUTH ONE TIME PER WEEK Patient taking differently: Take 50,000 Units by mouth every Saturday.  09/30/19   Copland, Gay Filler, MD     ALLERGIES:  Allergies  Allergen Reactions  . Penicillins Hives    Did it involve swelling of the face/tongue/throat, SOB, or low BP? Yes--syncope episode Did it involve sudden or severe rash/hives, skin peeling, or any reaction on the inside of your mouth or nose? No Did you need to seek medical attention at a hospital or doctor's office? Yes When did it last happen? 37-38 YEARS AGO If all above answers are "NO", may proceed with cephalosporin use.   . Metformin And Related Diarrhea  . Adhesive [Tape] Other (See Comments)    "severe bruising"  ALSO SKIN GLUE  . Asa [Aspirin] Other (See Comments)    GI upset   . Mango Flavor Hives  . Olive Tree Hives    Pt tolerates olive oil    SOCIAL HISTORY:  Social History   Tobacco Use  . Smoking status: Former Smoker    Packs/day: 1.00    Years: 5.00    Pack years: 5.00    Quit date: 05/08/1988    Years since quitting: 31.4  . Smokeless tobacco: Never Used  Substance Use Topics  . Alcohol use: Yes    Comment: social    FAMILY HISTORY: Family History  Problem Relation Age of Onset  . Hypertension Father   . Cancer Father 62       lung cancer  . Stroke Father   . Heart disease Mother        arrhythmia  . Hypertension Mother   . Kidney failure Sister     EXAM: BP (!) 143/95 (BP Location: Left Arm)   Pulse 90   Temp 98.8 F (37.1 C) (Oral)   Resp 17   Ht 5' 6"  (1.676 m)   Wt 80.3 kg   LMP 11/14/2013   SpO2 100%   BMI 28.57 kg/m  CONSTITUTIONAL: Alert and oriented and responds appropriately to questions.  Thin, chronically ill-appearing, appears uncomfortable HEAD: Normocephalic EYES: Conjunctivae clear, pupils appear equal, EOM appear intact ENT: normal nose; moist mucous membranes NECK: Supple, normal ROM CARD: RRR; S1 and S2 appreciated; no murmurs, no clicks, no rubs, no gallops RESP: Completely diminished aeration throughout the left lung.  Mild crackles at the right lung base.  She appears dyspneic  on exam.  Wears 2 L chronically and no current hypoxia. ABD/GI: Normal bowel sounds; non-distended; soft, non-tender, no rebound, no guarding, no peritoneal signs, no hepatosplenomegaly BACK:  The back appears normal EXT: Normal ROM in all joints; no deformity noted, no edema; no cyanosis, no calf tenderness or calf swelling SKIN: Normal color for age and race; warm; no rash on exposed skin NEURO: Moves all extremities equally PSYCH: The patient's mood and manner are appropriate.   MEDICAL DECISION MAKING: Patient here with atypical chest pain and shortness of breath.  Work-up  obtained at [facility] shows no leukocytosis, stable hemoglobin, no significant electrolyte derangement other than elevated glucose of 200.  Troponin was 6.  Chest x-ray showed complete opacification of the left hemithorax with nonvisualization of the previously seen aerated portion of the left upper lung likely due to continued accumulation of pleural fluid given no one has been helping her drain the fluid at home over the past 2 days.  She does now also have consolidative opacity in the right lung base with air bronchograms that represents acute infection versus inflammation, possibly pneumonia or sequela of aspiration.  She denies any recent aspiration.  I am concerned this is infectious.  We will treat for HCAP and obtain blood cultures.  Her Covid test at [facility] was negative.  I feel she will need admission to the hospital given concerns for HCAP and worsening SOB and she agrees.  Will give pain and nausea medicine.  Will discuss with hospitalist.  ED PROGRESS: 5:45 AM Discussed patient's case with hospitalist, Dr. Hal Hope.  I have recommended admission and patient (and family if present) agree with this plan. Admitting physician will place admission orders.   I reviewed all nursing notes, vitals, pertinent previous records and reviewed/interpreted all EKGs, lab and urine results, imaging (as  available).       EKG Interpretation  Date/Time:  Thursday October 16 2019 07:23:31 EDT Ventricular Rate:  85 PR Interval:    QRS Duration: 88 QT Interval:  361 QTC Calculation: 430 R Axis:   37 Text Interpretation: Sinus rhythm Low voltage, precordial leads Probable anteroseptal infarct, old No significant change since last tracing Confirmed by Marleah Beever, Cyril Mourning [phone]) on 10/16/2019 7:25:11 AM       CRITICAL CARE Performed by: Cyril Mourning Alexxander Kurt   Total critical care time: 40 minutes  Critical care time was exclusive of separately billable procedures and treating other patients.  Critical care was necessary to treat or prevent imminent or life-threatening deterioration.  Critical care was time spent personally by me on the following activities: development of treatment plan with patient and/or surrogate as well as nursing, discussions with consultants, evaluation of patient's response to treatment, examination of patient, obtaining history from patient or surrogate, ordering and performing treatments and interventions, ordering and review of laboratory studies, ordering and review of radiographic studies, pulse oximetry and re-evaluation of patient's condition.   KATHARINE ROCHEFORT was evaluated in Emergency Department on 10/16/2019 for the symptoms described in the history of present illness. She was evaluated in the context of the global COVID-19 pandemic, which necessitated consideration that the patient might be at risk for infection with the SARS-CoV-2 virus that causes COVID-19. Institutional protocols and algorithms that pertain to the evaluation of patients at risk for COVID-19 are in a state of rapid change based on information released by regulatory bodies including the CDC and federal and state organizations. These policies and algorithms were followed during the patient's care in the ED.      Alin Chavira, Delice Bison, DO 10/16/19 [phone]

## 2019-10-16 NOTE — Telephone Encounter (Signed)
Thank you. GM 

## 2019-10-16 NOTE — ED Notes (Signed)
IV team bedside for PICC placement.

## 2019-10-16 NOTE — ED Notes (Addendum)
  [employer] officer comes in and states that patient called 911 and told them we were holding her against her will.  Officer was informed that we have spoken with patient several times and she is free to go on her own but we are unable to transfer her to another hospital before she has been seen by the EDP.  Patient was advised to stay and be treated but is still upset with staff and does not understand why she can not be transferred.

## 2019-10-16 NOTE — ED Notes (Addendum)
In room to speak with patient and husband. Husband and patient upset due to long wait times. Explained to patient and husband that we only had one doctor and she was seeing patients as fast as she could. Husband ask for her to be transferred to [facility].  Explained to patient and husband that we could not transfer her until after the doctor sees her and makes that decision. Patient and husband voiced understanding.

## 2019-10-16 NOTE — ED Notes (Signed)
ICU MD bedside to place central line.

## 2019-10-16 NOTE — ED Triage Notes (Signed)
Patient Came from [facility]. Patient had labs and other test done. Patient comes here to be admitted.

## 2019-10-16 NOTE — ED Notes (Signed)
Per IV team RN- pt with poor peripheral vein access, poor candidate for PIV placement. MD Sherral Hammers made aware.

## 2019-10-17 ENCOUNTER — Other Ambulatory Visit: Payer: Self-pay

## 2019-10-17 ENCOUNTER — Inpatient Hospital Stay (HOSPITAL_COMMUNITY): Payer: 59

## 2019-10-17 DIAGNOSIS — C773 Secondary and unspecified malignant neoplasm of axilla and upper limb lymph nodes: Secondary | ICD-10-CM

## 2019-10-17 DIAGNOSIS — Z170 Estrogen receptor positive status [ER+]: Secondary | ICD-10-CM

## 2019-10-17 DIAGNOSIS — Z9221 Personal history of antineoplastic chemotherapy: Secondary | ICD-10-CM

## 2019-10-17 DIAGNOSIS — C50811 Malignant neoplasm of overlapping sites of right female breast: Principal | ICD-10-CM

## 2019-10-17 DIAGNOSIS — Z923 Personal history of irradiation: Secondary | ICD-10-CM

## 2019-10-17 DIAGNOSIS — J910 Malignant pleural effusion: Secondary | ICD-10-CM

## 2019-10-17 LAB — LIPID PANEL
Cholesterol: 150 mg/dL (ref 0–200)
HDL: 33 mg/dL — ABNORMAL LOW (ref >40)
LDL Cholesterol: 93 mg/dL (ref 0–99)
Total CHOL/HDL Ratio: 4.5 RATIO
Triglycerides: 121 mg/dL (ref <150)
VLDL: 24 mg/dL (ref 0–40)

## 2019-10-17 LAB — CBC WITH DIFFERENTIAL/PLATELET
Abs Immature Granulocytes: 0.07 10*3/uL (ref 0.00–0.07)
Basophils Absolute: 0 10*3/uL (ref 0.0–0.1)
Basophils Relative: 0 %
Eosinophils Absolute: 0.1 10*3/uL (ref 0.0–0.5)
Eosinophils Relative: 1 %
HCT: 31.2 % — ABNORMAL LOW (ref 36.0–46.0)
Hemoglobin: 10.4 g/dL — ABNORMAL LOW (ref 12.0–15.0)
Immature Granulocytes: 1 %
Lymphocytes Relative: 14 %
Lymphs Abs: 1 10*3/uL (ref 0.7–4.0)
MCH: 29.9 pg (ref 26.0–34.0)
MCHC: 33.3 g/dL (ref 30.0–36.0)
MCV: 89.7 fL (ref 80.0–100.0)
Monocytes Absolute: 0.7 10*3/uL (ref 0.1–1.0)
Monocytes Relative: 10 %
Neutro Abs: 5.1 10*3/uL (ref 1.7–7.7)
Neutrophils Relative %: 74 %
Platelets: 377 10*3/uL (ref 150–400)
RBC: 3.48 MIL/uL — ABNORMAL LOW (ref 3.87–5.11)
RDW: 13.3 % (ref 11.5–15.5)
WBC: 6.9 10*3/uL (ref 4.0–10.5)
nRBC: 0 % (ref 0.0–0.2)

## 2019-10-17 LAB — COMPREHENSIVE METABOLIC PANEL
ALT: 16 U/L (ref 0–44)
AST: 27 U/L (ref 15–41)
Albumin: 2.6 g/dL — ABNORMAL LOW (ref 3.5–5.0)
Alkaline Phosphatase: 49 U/L (ref 38–126)
Anion gap: 14 (ref 5–15)
BUN: 17 mg/dL (ref 6–20)
CO2: 21 mmol/L — ABNORMAL LOW (ref 22–32)
Calcium: 9 mg/dL (ref 8.9–10.3)
Chloride: 102 mmol/L (ref 98–111)
Creatinine, Ser: 0.67 mg/dL (ref 0.44–1.00)
GFR calc Af Amer: >60 mL/min (ref >60)
GFR calc non Af Amer: >60 mL/min (ref >60)
Glucose, Bld: 166 mg/dL — ABNORMAL HIGH (ref 70–99)
Potassium: 3.9 mmol/L (ref 3.5–5.1)
Sodium: 137 mmol/L (ref 135–145)
Total Bilirubin: 0.4 mg/dL (ref 0.3–1.2)
Total Protein: 5.9 g/dL — ABNORMAL LOW (ref 6.5–8.1)

## 2019-10-17 LAB — GLUCOSE, CAPILLARY
Glucose-Capillary: 163 mg/dL — ABNORMAL HIGH (ref 70–99)
Glucose-Capillary: 166 mg/dL — ABNORMAL HIGH (ref 70–99)
Glucose-Capillary: 205 mg/dL — ABNORMAL HIGH (ref 70–99)
Glucose-Capillary: 138 mg/dL — ABNORMAL HIGH (ref 70–99)
Glucose-Capillary: 174 mg/dL — ABNORMAL HIGH (ref 70–99)

## 2019-10-17 LAB — PROCALCITONIN: Procalcitonin: <0.1 ng/mL

## 2019-10-17 LAB — CYTOLOGY - NON PAP

## 2019-10-17 LAB — MAGNESIUM: Magnesium: 1.6 mg/dL — ABNORMAL LOW (ref 1.7–2.4)

## 2019-10-17 LAB — TRIGLYCERIDES, BODY FLUIDS: Triglycerides, Fluid: 46 mg/dL

## 2019-10-17 LAB — PH, BODY FLUID: pH, Body Fluid: 7.5

## 2019-10-17 LAB — PHOSPHORUS: Phosphorus: 3 mg/dL (ref 2.5–4.6)

## 2019-10-17 MED ORDER — HYDROCODONE-ACETAMINOPHEN 5-325 MG PO TABS
1.0000 | ORAL_TABLET | Freq: Four times a day (QID) | ORAL | Status: DC | PRN
  Administered 2019-10-17 – 2019-10-20 (×8): 1 via ORAL
  Filled 2019-10-17 (×10): qty 1

## 2019-10-17 MED ORDER — ATENOLOL 50 MG PO TABS
100.0000 mg | ORAL_TABLET | Freq: Every day | ORAL | Status: DC
  Administered 2019-10-17 – 2019-11-13 (×27): 100 mg via ORAL
  Filled 2019-10-17 (×28): qty 2

## 2019-10-17 MED ORDER — MAGNESIUM SULFATE 2 GM/50ML IV SOLN
2.0000 g | Freq: Once | INTRAVENOUS | Status: AC
  Administered 2019-10-17: 2 g via INTRAVENOUS
  Filled 2019-10-17: qty 50

## 2019-10-17 MED ORDER — SUCRALFATE 1 GM/10ML PO SUSP
1.0000 g | Freq: Three times a day (TID) | ORAL | Status: DC
  Administered 2019-10-17 – 2019-11-13 (×77): 1 g via ORAL
  Filled 2019-10-17 (×99): qty 10

## 2019-10-17 MED ORDER — ENOXAPARIN SODIUM 40 MG/0.4ML ~~LOC~~ SOLN
40.0000 mg | SUBCUTANEOUS | Status: DC
  Administered 2019-10-18 – 2019-11-09 (×22): 40 mg via SUBCUTANEOUS
  Filled 2019-10-17 (×22): qty 0.4

## 2019-10-17 MED ORDER — MORPHINE SULFATE (PF) 2 MG/ML IV SOLN
2.0000 mg | INTRAVENOUS | Status: DC | PRN
  Administered 2019-10-17 – 2019-10-21 (×16): 2 mg via INTRAVENOUS
  Filled 2019-10-17 (×18): qty 1

## 2019-10-17 NOTE — Progress Notes (Addendum)
PROGRESS NOTE    Autumn Cook  UEK:[id number] DOB: 03-10-1961 DOA: 10/16/2019 PCP: Darreld Mclean, MD   Brief Narrative:  Patient is a 59 year old female with history of right breast cancer, hypertension, hyperlipidemia, type 2 diabetes mellitus, asthma who presents to the emergency department with complaints of central sharp chest pain as well as shortness of breath.  She was recently admitted here and discharged 2 days ago when she underwent Pleurx catheter placement by PCCM after she was found to have large left pleural effusion secondary to her breast cancer.  Patient did not know how to use the Pleurx catheter at home.  On presentation, chest x-ray showed complete widening of the left lung but there was also suspicion for new consolidations on the right side concerning for pneumonia for which she was started on antibiotics.  Currently she is hemodynamically stable.  Assessment & Plan:   Active Problems:   Hypertension   History of tobacco use   Hypercholesterolemia   Malignant neoplasm of overlapping sites of right breast in female, estrogen receptor positive ([place])   Encounter for central line placement   Malignant pleural effusion   Metastases to the liver [facility])   Acute respiratory failure (HCC)   Diabetes mellitus type 2, uncontrolled, with complications (HCC)   Acute respiratory failure with hypoxia ([place])   HCAP (healthcare-associated pneumonia)  Acute on chronic respiratory failure with hypoxia: Secondary to left-sided extensive malignant pleural effusion.  She is on 2 L of oxygen at home at baseline.  She was recently admitted here and underwent left-sided Pleurx catheter but she did not know how to operate it at home.  Currently her respiratory status is at baseline.  She is not dyspneic or in any kind of respiratory distress. CXR this morning showed worsening opacification of the left chest with white out and increased mediastinal mass effect, consistent with large  volume pleural effusion.  She is s/p moval of 650 mL of serosanguineous fluid through the Pleurx catheter on 10/16/2019.  Pleurx catheter needs to be drained as needed at home. I discussed with Dr. Lake Bells  today about the pleurx catheter education for the patient, as per him PCCM has arranged home home health agency/[facility] who will come to her home for pleurx education and management.  Suspected HCAP: CXR on  presentation showed possible consolidations on the right side.She is not febrile or septic.  She was started on cefepime by admitting physician.  Chest x-ray done yesterday showed the right lung was clear.  I will discontinue antibiotics.  I have very low suspicion for pneumonia.  Procalcitonin is negative.  Malignant neoplasm of the right breast: [name] is following.  Not started on chemotherapy yet.  Hypertension: Currently blood pressure stable.  Continue current medication  Diabetes mellitus: A1c of 7.8.  Continue sliding scale insulin for now.  She is not on any medications at home.  We will consider putting her on Metformin 500 mg twice a day on discharge.  Hypomagnesemia: Supplemented with potassium.  Goals of care: Palliative care was consulted on her last admission.  Remains full code.         DVT prophylaxis: Lovenox Code Status: Full Family Communication: None at the bedside Status is: Inpatient  Remains inpatient appropriate because:Unsafe d/c plan   Dispo: The patient is from: Home              Anticipated d/c is to: Home  Anticipated d/c date is: 1 day              Patient currently is not medically stable to d/c.  Patient did not feel ready to go home today.  She was complaining of nonspecific abdominal pain and requesting to go home tomorrow.  Consultants: PCCM  Procedures:None  Antimicrobials:  Anti-infectives (From admission, onward)   Start     Dose/Rate Route Frequency Ordered Stop   10/16/19 1400  ceFEPIme (MAXIPIME) 2 g in  sodium chloride 0.9 % 100 mL IVPB     Discontinue     2 g 200 mL/hr over 30 Minutes Intravenous Every 8 hours 10/16/19 0817     10/16/19 0530  vancomycin (VANCOCIN) IVPB 1000 mg/200 mL premix        1,000 mg 200 mL/hr over 60 Minutes Intravenous  Once 10/16/19 0520 10/16/19 0741   10/16/19 0530  ceFEPIme (MAXIPIME) 2 g in sodium chloride 0.9 % 100 mL IVPB        2 g 200 mL/hr over 30 Minutes Intravenous  Once 10/16/19 0520 10/16/19 0741      Subjective: Patient seen and examined at the bedside this morning.  Hemodynamically stable.  Looked comfortable during my evaluation.  She was not in any respiratory distress.  She says that she does not know how to operate the Pleurx catheter.  She wants to get the full education about the Pleurx catheter drainage before going home.  She was also complaining of some abdominal discomfort but her abdomen was soft and nontender on examination.  Objective: Vitals:   10/16/19 2049 10/16/19 2124 10/17/19 0237 10/17/19 0624  BP: 125/82 128/73 134/86 (!) 148/85  Pulse: (!) 104 100 95 93  Resp: 18 (!) 24 18 18   Temp:  100 F (37.8 C) 100 F (37.8 C) 99.8 F (37.7 C)  TempSrc:  Oral Oral Oral  SpO2: 98% 98% 96% 100%  Weight:      Height:        Intake/Output Summary (Last 24 hours) at 10/17/2019 0841 Last data filed at 10/17/2019 0200 Gross per 24 hour  Intake 100 ml  Output --  Net 100 ml   Filed Weights   10/16/19 0431  Weight: 80.3 kg    Examination:  General exam:Not in distress,average built HEENT:PERRL,Oral mucosa moist, Ear/Nose normal on gross exam Respiratory system: Significantly diminished air entry on the left side .  Central line on the chest cardiovascular system: S1 & S2 heard, RRR. No JVD, murmurs, rubs, gallops or clicks. No pedal edema. Gastrointestinal system: Abdomen is nondistended, soft and nontender. No organomegaly or masses felt. Normal bowel sounds heard. Central nervous system: Alert and oriented. No focal  neurological deficits. Extremities: No edema, no clubbing ,no cyanosis Skin: No rashes, lesions or ulcers,no icterus ,no pallor     Data Reviewed: I have personally reviewed following labs and imaging studies  CBC: Recent Labs  Lab 10/12/19 0459 10/12/19 0459 10/13/19 0600 10/14/19 0242 10/16/19 0043 10/16/19 1608 10/17/19 0519  WBC 9.2   < > 10.9* 8.8 7.6 6.9 6.9  NEUTROABS 7.4  --  8.9* 6.7 5.9  --  5.1  HGB 10.8*   < > 11.4* 10.7* 10.7* 10.2* 10.4*  HCT 33.0*   < > 34.6* 32.4* 32.8* 30.9* 31.2*  MCV 88.9   < > 89.9 88.8 89.1 89.6 89.7  PLT 327   < > 401* 339 375 363 377   < > = values in this interval not displayed.  Basic Metabolic Panel: Recent Labs  Lab 10/12/19 0459 10/12/19 0459 10/13/19 0600 10/14/19 0242 10/16/19 0043 10/16/19 1608 10/17/19 0519  NA 138  --  137 138 136  --  137  K 4.2  --  4.3 4.2 4.5  --  3.9  CL 103  --  102 104 102  --  102  CO2 22  --  21* 23 24  --  21*  GLUCOSE 236*  --  291* 174* 200*  --  166*  BUN 20  --  17 20 17   --  17  CREATININE 0.78   < > 0.72 0.87 0.78 0.62 0.67  CALCIUM 8.7*  --  9.4 9.1 9.0  --  9.0  MG  --   --   --   --   --   --  1.6*  PHOS  --   --   --   --   --   --  3.0   < > = values in this interval not displayed.   GFR: Estimated Creatinine Clearance: 82.9 mL/min (by C-G formula based on SCr of 0.67 mg/dL). Liver Function Tests: Recent Labs  Lab 10/16/19 1608 10/17/19 0519  AST  --  27  ALT  --  16  ALKPHOS  --  49  BILITOT  --  0.4  PROT 5.9* 5.9*  ALBUMIN 2.6* 2.6*   No results for input(s): LIPASE, AMYLASE in the last 168 hours. No results for input(s): AMMONIA in the last 168 hours. Coagulation Profile: No results for input(s): INR, PROTIME in the last 168 hours. Cardiac Enzymes: No results for input(s): CKTOTAL, CKMB, CKMBINDEX, TROPONINI in the last 168 hours. BNP (last 3 results) No results for input(s): PROBNP in the last 8760 hours. HbA1C: No results for input(s): HGBA1C in the  last 72 hours. CBG: Recent Labs  Lab 10/14/19 0738 10/14/19 1123 10/16/19 2236 10/17/19 0608 10/17/19 0739  GLUCAP 159* 203* 198* 163* 166*   Lipid Profile: Recent Labs    10/17/19 0519  CHOL 150  HDL 33*  LDLCALC 93  TRIG 121  CHOLHDL 4.5   Thyroid Function Tests: No results for input(s): TSH, T4TOTAL, FREET4, T3FREE, THYROIDAB in the last 72 hours. Anemia Panel: No results for input(s): VITAMINB12, FOLATE, FERRITIN, TIBC, IRON, RETICCTPCT in the last 72 hours. Sepsis Labs: Recent Labs  Lab 10/16/19 1608 10/17/19 0519  PROCALCITON <0.10 <0.10  LATICACIDVEN 1.7  --     Recent Results (from the past 240 hour(s))  Culture, blood (routine x 2)     Status: None   Collection Time: 10/09/19  5:02 PM   Specimen: BLOOD  Result Value Ref Range Status   Specimen Description   Final    BLOOD LEFT ANTECUBITAL Performed at [facility], [address] [place], [place] [address]    Special Requests   Final    BOTTLES DRAWN AEROBIC AND ANAEROBIC Blood Culture adequate volume Performed at [facility], [address] [place], [place] [address]    Culture   Final    NO GROWTH 5 DAYS Performed at [facility], [place] [address], [place], [place] [address]    Report Status 10/14/2019 FINAL  Final  Culture, blood (routine x 2)     Status: None   Collection Time: 10/09/19  5:04 PM   Specimen: BLOOD  Result Value Ref Range Status   Specimen Description   Final    BLOOD BLOOD LEFT FOREARM Performed  at [facility], [address] [place], [place] [address]    Special Requests   Final    BOTTLES DRAWN AEROBIC ONLY Blood Culture results may not be optimal due to an inadequate volume of blood received in culture bottles Performed at [facility], [address] [place], [place] [address]    Culture   Final    NO GROWTH 5 DAYS Performed at [facility], [place] [address], [place], [place] [address]    Report Status  10/14/2019 FINAL  Final  SARS Coronavirus 2 by RT PCR (hospital order, performed in [facility] hospital lab) Nasopharyngeal Nasopharyngeal Swab     Status: None   Collection Time: 10/09/19  5:29 PM   Specimen: Nasopharyngeal Swab  Result Value Ref Range Status   SARS Coronavirus 2 NEGATIVE NEGATIVE Final    Comment: (NOTE) SARS-CoV-2 target nucleic acids are NOT DETECTED. The SARS-CoV-2 RNA is generally detectable in upper and lower respiratory specimens during the acute phase of infection. The lowest concentration of SARS-CoV-2 viral copies this assay can detect is 250 copies / mL. A negative result does not preclude SARS-CoV-2 infection and should not be used as the sole basis for treatment or other patient management decisions.  A negative result may occur with improper specimen collection / handling, submission of specimen other than nasopharyngeal swab, presence of viral mutation(s) within the areas targeted by this assay, and inadequate number of viral copies (<250 copies / mL). A negative result must be combined with clinical observations, patient history, and epidemiological information. Fact Sheet for Patients:   StrictlyIdeas.no Fact Sheet for Healthcare Providers: BankingDealers.co.za This test is not yet approved or cleared  by the [place] FDA and has been authorized for detection and/or diagnosis of SARS-CoV-2 by FDA under an Emergency Use Authorization (EUA).  This EUA will remain in effect (meaning this test can be used) for the duration of the COVID-19 declaration under Section 564(b)(1) of the Act, 21 U.S.C. section 360bbb-3(b)(1), unless the authorization is terminated or revoked sooner. Performed at [facility], [address] [place], [place] [address]   MRSA PCR Screening     Status: None   Collection Time: 10/13/19  9:37 AM   Specimen: Nasal Mucosa; Nasopharyngeal  Result Value Ref Range Status    MRSA by PCR NEGATIVE NEGATIVE Final    Comment:        The GeneXpert MRSA Assay (FDA approved for NASAL specimens only), is one component of a comprehensive MRSA colonization surveillance program. It is not intended to diagnose MRSA infection nor to guide or monitor treatment for MRSA infections. Performed at [facility], [place] [address] [place], [place] [address]   SARS Coronavirus 2 by RT PCR (hospital order, performed in [facility] hospital lab) Nasopharyngeal Nasopharyngeal Swab     Status: None   Collection Time: 10/16/19 12:43 AM   Specimen: Nasopharyngeal Swab  Result Value Ref Range Status   SARS Coronavirus 2 NEGATIVE NEGATIVE Final    Comment: (NOTE) SARS-CoV-2 target nucleic acids are NOT DETECTED.  The SARS-CoV-2 RNA is generally detectable in upper and lower respiratory specimens during the acute phase of infection. The lowest concentration of SARS-CoV-2 viral copies this assay can detect is 250 copies / mL. A negative result does not preclude SARS-CoV-2 infection and should not be used as the sole basis for treatment or other patient management decisions.  A negative result may occur with improper  specimen collection / handling, submission of specimen other than nasopharyngeal swab, presence of viral mutation(s) within the areas targeted by this assay, and inadequate number of viral copies (<250 copies / mL). A negative result must be combined with clinical observations, patient history, and epidemiological information.  Fact Sheet for Patients:   StrictlyIdeas.no  Fact Sheet for Healthcare Providers: BankingDealers.co.za  This test is not yet approved or  cleared by the [place] FDA and has been authorized for detection and/or diagnosis of SARS-CoV-2 by FDA under an Emergency Use Authorization (EUA).  This EUA will remain in effect (meaning this test can be used) for the duration of  the COVID-19 declaration under Section 564(b)(1) of the Act, 21 U.S.C. section 360bbb-3(b)(1), unless the authorization is terminated or revoked sooner.  Performed at [facility], [address] [place], [place] [address]   Blood culture (routine x 2)     Status: None (Preliminary result)   Collection Time: 10/16/19  5:20 AM   Specimen: BLOOD LEFT ARM  Result Value Ref Range Status   Specimen Description   Final    BLOOD LEFT ARM Performed at [place] [address], [place], [place] [address]    Special Requests   Final    BOTTLES DRAWN AEROBIC AND ANAEROBIC Blood Culture results may not be optimal due to an inadequate volume of blood received in culture bottles Performed at [place] [address], [place], [place] [address]    Culture   Final    NO GROWTH < 24 HOURS Performed at [facility] [address] [place], [place] [address]    Report Status PENDING  Incomplete  Body fluid culture (includes gram stain)     Status: None (Preliminary result)   Collection Time: 10/16/19  7:48 AM   Specimen: Pleural Fluid  Result Value Ref Range Status   Specimen Description   Final    PLEURAL Performed at [place] [address] [place], [place] [address]    Special Requests   Final    NONE Performed at [facility], [place] [address] [place], [place] [address]    Gram Stain   Final    RARE WBC PRESENT, PREDOMINANTLY MONONUCLEAR NO ORGANISMS SEEN    Culture   Final    NO GROWTH < 24 HOURS Performed at [facility], [place] [address], [place], [place] [address]    Report Status PENDING  Incomplete  Blood culture (routine x 2)     Status: None (Preliminary result)   Collection Time: 10/16/19  9:42 PM   Specimen: BLOOD  Result Value Ref Range Status   Specimen Description   Final    BLOOD LEFT HAND Performed at [facility] [address],  [place], [place] [address]    Special Requests   Final    BOTTLES DRAWN AEROBIC ONLY Blood Culture adequate volume Performed at [place] [address] [place], [place] [address]    Culture   Final    NO GROWTH < 12 HOURS Performed at [place] [address] [place], [place] [address]    Report Status PENDING  Incomplete         Radiology Studies: DG Chest 2 View  Result Date: 10/16/2019 CLINICAL DATA:  Chest pain, thoracentesis and chest tube insertion 2 days prior EXAM: CHEST - 2 VIEW COMPARISON:  Radiograph 10/14/2019 FINDINGS: Now complete  opacification of the left hemithorax with nonvisualization of the previously seen aerated portion of the left upper lung likely reflecting progressive atelectatic collapse and accumulation of pleural fluid. There is some increasingly consolidative opacity in the right lung base with air bronchograms. No visible pneumothorax or right effusion. Cardiomediastinal contours are largely obscured. The right heart border is grossly unchanged from prior. No acute osseous or soft tissue abnormality. Degenerative changes are present in the imaged spine and shoulders. IMPRESSION: 1. Complete opacification of the left hemithorax with nonvisualization of the previously seen aerated portion of the left upper lung, likely reflecting progressive atelectatic collapse and accumulation of pleural fluid. 2. Increasingly consolidative opacity in the right lung base with air bronchograms. Could reflect acute infection or inflammation, possibly pneumonia or sequela of aspiration. Electronically Signed   By: Lovena Le M.D.   On: 10/16/2019 00:12   DG Chest Port 1 View  Result Date: 10/17/2019 CLINICAL DATA:  Pleural effusion EXAM: PORTABLE CHEST 1 VIEW COMPARISON:  Yesterday FINDINGS: White out of the left chest which has further progressed from yesterday. There is mass effect with rightward mediastinal displacement. Asymmetric right apical  opacity from uncertain cause. IMPRESSION: Worsening opacification of the left chest with white out and increased mediastinal mass effect, consistent with large volume pleural effusion. A left tunneled pleural catheter is in similar position. Electronically Signed   By: Monte Fantasia M.D.   On: 10/17/2019 05:02   DG Chest Port 1 View  Result Date: 10/16/2019 CLINICAL DATA:  History of breast cancer. Chest pain and shortness of breath. EXAM: PORTABLE CHEST 1 VIEW COMPARISON:  Earlier same day FINDINGS: Extensive artifact overlies the chest. Left internal jugular central line catheter in the SVC at the azygos level. Right chest remains clear. Chest tube in place at the inferior pleural space on the left. Large left effusion persists with near complete atelectasis of the left lung. IMPRESSION: New left internal jugular central line tip in the SVC at the azygos level. No other change. Left chest tube. Large left effusion with left lung atelectasis. Electronically Signed   By: Nelson Chimes M.D.   On: 10/16/2019 15:52   DG CHEST PORT 1 VIEW  Result Date: 10/16/2019 CLINICAL DATA:  Left pleural effusion EXAM: PORTABLE CHEST 1 VIEW COMPARISON:  October 16, 2019 study obtained earlier in the day FINDINGS: There remains extensive opacification on the left due to combination of left pleural effusion and airspace atelectasis/consolidation. The right lung is clear. Heart is prominent and stable. No new opacity evident. The visualized pulmonary vascularity is unremarkable. No adenopathy appreciable in areas that can be assessed with respect to potential adenopathy. No bone lesions. No pneumothorax. IMPRESSION: Extensive opacity again noted on the left due to combination of effusion and atelectasis/consolidation. Right lung clear. Stable cardiac silhouette. No pneumothorax. Electronically Signed   By: Lowella Grip III M.D.   On: 10/16/2019 10:21   [place] EKG SITE RITE  Result Date: 10/16/2019 If Site Rite image not  attached, placement could not be confirmed due to current cardiac rhythm.       Scheduled Meds: . Chlorhexidine Gluconate Cloth  6 each Topical Daily  . enoxaparin (LOVENOX) injection  30 mg Subcutaneous Q12H  . insulin aspart  0-15 Units Subcutaneous Q4H  . levalbuterol  1.25 mg Nebulization BID  . metoprolol tartrate  12.5 mg Oral BID  . sodium chloride flush  10-40 mL Intracatheter Q12H  . sucralfate  1 g Oral TID WC & HS   Continuous  Infusions: . ceFEPime (MAXIPIME) IV 2 g (10/16/19 2339)  . magnesium sulfate bolus IVPB       LOS: 1 day    Time spent: More than 50% of that time was spent in counseling and/or coordination of care.      Shelly Coss, MD [facility] P6/03/2020, 8:41 AM

## 2019-10-17 NOTE — Progress Notes (Signed)
Autumn Cook   DOB:06-Mar-1961   OX#:[id number]   QAS#:[id number]  Subjective:  Autumn Cook tells me that she had temperatures to 100.9, drenching sweats, and paroxysmal cough at home, which took her to the emergency room in [place].  She says that they infiltrated her arm while trying to get a CT done and that the doctor had not come to see her after she had been 3 hours in the room.  Autumn Cook then called the police and complained that she was being denied care.  The police came and the patient and took notes, but the patient opted to get back in her car with her husband and one of her children and come to the emergency room at [facility] where she was admitted--for details see Dr. Sherral Hammers' note.  This morning the patient tells me her breathing is better.  She tells me that whenever she takes antibiotics she gets severe abdominal problems and that is the case now.  She has a bad taste and water brash.  Her aunt in the room is very concerned that the patient's appetite is down and wonders if she needs an appetite stimulant.  She also thought the patient should have a "coating" for her throat because of the antibiotics.   Objective: African-American woman examined in bed Vitals:   10/17/19 0237 10/17/19 0624  BP: 134/86 (!) 148/85  Pulse: 95 93  Resp: 18 18  Temp: 100 F (37.8 C) 99.8 F (37.7 C)  SpO2: 96% 100%    Body mass index is 28.57 kg/m.  Intake/Output Summary (Last 24 hours) at 10/17/2019 0825 Last data filed at 10/17/2019 0200 Gross per 24 hour  Intake 100 ml  Output --  Net 100 ml       Lungs no rales or wheezes--auscultated anterolaterally  Heart regular rate and rhythm  Abdomen soft, +BS  Neuro nonfocal  Breast exam: Deferred  CBG (last 3)  Recent Labs    10/16/19 2236 10/17/19 0608 10/17/19 0739  GLUCAP 198* 163* 166*     Labs:  Lab Results  Component Value Date   WBC 6.9 10/17/2019   HGB 10.4 (L) 10/17/2019   HCT 31.2 (L) 10/17/2019   MCV 89.7 10/17/2019   PLT  377 10/17/2019   NEUTROABS 5.1 10/17/2019    @LASTCHEMISTRY @  Urine Studies No results for input(s): UHGB, CRYS in the last 72 hours.  Invalid input(s): UACOL, UAPR, USPG, UPH, UTP, UGL, UKET, UBIL, UNIT, UROB, ULEU, UEPI, UWBC, URBC, [place], [place], [place], [place]  Basic Metabolic Panel: Recent Labs  Lab 10/12/19 0459 10/12/19 0459 10/13/19 0600 10/13/19 0600 10/14/19 0242 10/14/19 0242 10/16/19 0043 10/16/19 1608 10/17/19 0519  NA 138  --  137  --  138  --  136  --  137  K 4.2   < > 4.3   < > 4.2   < > 4.5  --  3.9  CL 103  --  102  --  104  --  102  --  102  CO2 22  --  21*  --  23  --  24  --  21*  GLUCOSE 236*  --  291*  --  174*  --  200*  --  166*  BUN 20  --  17  --  20  --  17  --  17  CREATININE 0.78   < > 0.72  --  0.87  --  0.78 0.62 0.67  CALCIUM 8.7*  --  9.4  --  9.1  --  9.0  --  9.0  MG  --   --   --   --   --   --   --   --  1.6*  PHOS  --   --   --   --   --   --   --   --  3.0   < > = values in this interval not displayed.   GFR Estimated Creatinine Clearance: 82.9 mL/min (by C-G formula based on SCr of 0.67 mg/dL). Liver Function Tests: Recent Labs  Lab 10/16/19 1608 10/17/19 0519  AST  --  27  ALT  --  16  ALKPHOS  --  49  BILITOT  --  0.4  PROT 5.9* 5.9*  ALBUMIN 2.6* 2.6*   No results for input(s): LIPASE, AMYLASE in the last 168 hours. No results for input(s): AMMONIA in the last 168 hours. Coagulation profile No results for input(s): INR, PROTIME in the last 168 hours.  CBC: Recent Labs  Lab 10/12/19 0459 10/12/19 0459 10/13/19 0600 10/14/19 0242 10/16/19 0043 10/16/19 1608 10/17/19 0519  WBC 9.2   < > 10.9* 8.8 7.6 6.9 6.9  NEUTROABS 7.4  --  8.9* 6.7 5.9  --  5.1  HGB 10.8*   < > 11.4* 10.7* 10.7* 10.2* 10.4*  HCT 33.0*   < > 34.6* 32.4* 32.8* 30.9* 31.2*  MCV 88.9   < > 89.9 88.8 89.1 89.6 89.7  PLT 327   < > 401* 339 375 363 377   < > = values in this interval not displayed.   Cardiac Enzymes: No results for input(s):  CKTOTAL, CKMB, CKMBINDEX, TROPONINI in the last 168 hours. BNP: Invalid input(s): POCBNP CBG: Recent Labs  Lab 10/14/19 0738 10/14/19 1123 10/16/19 2236 10/17/19 0608 10/17/19 0739  GLUCAP 159* 203* 198* 163* 166*   D-Dimer No results for input(s): DDIMER in the last 72 hours. Hgb A1c No results for input(s): HGBA1C in the last 72 hours. Lipid Profile Recent Labs    10/17/19 0519  CHOL 150  HDL 33*  LDLCALC 93  TRIG 121  CHOLHDL 4.5   Thyroid function studies No results for input(s): TSH, T4TOTAL, T3FREE, THYROIDAB in the last 72 hours.  Invalid input(s): FREET3 Anemia work up No results for input(s): VITAMINB12, FOLATE, FERRITIN, TIBC, IRON, RETICCTPCT in the last 72 hours. Microbiology Recent Results (from the past 240 hour(s))  Culture, blood (routine x 2)     Status: None   Collection Time: 10/09/19  5:02 PM   Specimen: BLOOD  Result Value Ref Range Status   Specimen Description   Final    BLOOD LEFT ANTECUBITAL Performed at [facility], [address] [place], [place] [address]    Special Requests   Final    BOTTLES DRAWN AEROBIC AND ANAEROBIC Blood Culture adequate volume Performed at [facility], [address] [place], [place] [address]    Culture   Final    NO GROWTH 5 DAYS Performed at [facility], [place] [address], [place], [place] [address]    Report Status 10/14/2019 FINAL  Final  Culture, blood (routine x 2)     Status: None   Collection Time: 10/09/19  5:04 PM   Specimen: BLOOD  Result Value Ref Range Status   Specimen Description   Final    BLOOD BLOOD LEFT FOREARM Performed at [facility], [address] [place], [place] [address]    Special  Requests   Final    BOTTLES DRAWN AEROBIC ONLY Blood Culture results may not be optimal due to an inadequate volume of blood received in culture bottles Performed at [facility], [address], [place], [place] [address]    Culture   Final     NO GROWTH 5 DAYS Performed at [facility], [place] [address], [place], [place] [address]    Report Status 10/14/2019 FINAL  Final  SARS Coronavirus 2 by RT PCR (hospital order, performed in [facility] hospital lab) Nasopharyngeal Nasopharyngeal Swab     Status: None   Collection Time: 10/09/19  5:29 PM   Specimen: Nasopharyngeal Swab  Result Value Ref Range Status   SARS Coronavirus 2 NEGATIVE NEGATIVE Final    Comment: (NOTE) SARS-CoV-2 target nucleic acids are NOT DETECTED. The SARS-CoV-2 RNA is generally detectable in upper and lower respiratory specimens during the acute phase of infection. The lowest concentration of SARS-CoV-2 viral copies this assay can detect is 250 copies / mL. A negative result does not preclude SARS-CoV-2 infection and should not be used as the sole basis for treatment or other patient management decisions.  A negative result may occur with improper specimen collection / handling, submission of specimen other than nasopharyngeal swab, presence of viral mutation(s) within the areas targeted by this assay, and inadequate number of viral copies (<250 copies / mL). A negative result must be combined with clinical observations, patient history, and epidemiological information. Fact Sheet for Patients:   StrictlyIdeas.no Fact Sheet for Healthcare Providers: BankingDealers.co.za This test is not yet approved or cleared  by the [place] FDA and has been authorized for detection and/or diagnosis of SARS-CoV-2 by FDA under an Emergency Use Authorization (EUA).  This EUA will remain in effect (meaning this test can be used) for the duration of the COVID-19 declaration under Section 564(b)(1) of the Act, 21 U.S.C. section 360bbb-3(b)(1), unless the authorization is terminated or revoked sooner. Performed at [facility], [address] [place], [place] [address]   MRSA PCR Screening     Status:  None   Collection Time: 10/13/19  9:37 AM   Specimen: Nasal Mucosa; Nasopharyngeal  Result Value Ref Range Status   MRSA by PCR NEGATIVE NEGATIVE Final    Comment:        The GeneXpert MRSA Assay (FDA approved for NASAL specimens only), is one component of a comprehensive MRSA colonization surveillance program. It is not intended to diagnose MRSA infection nor to guide or monitor treatment for MRSA infections. Performed at [facility], [place] [address] [place], [place] [address]   SARS Coronavirus 2 by RT PCR (hospital order, performed in [facility] hospital lab) Nasopharyngeal Nasopharyngeal Swab     Status: None   Collection Time: 10/16/19 12:43 AM   Specimen: Nasopharyngeal Swab  Result Value Ref Range Status   SARS Coronavirus 2 NEGATIVE NEGATIVE Final    Comment: (NOTE) SARS-CoV-2 target nucleic acids are NOT DETECTED.  The SARS-CoV-2 RNA is generally detectable in upper and lower respiratory specimens during the acute phase of infection. The lowest concentration of SARS-CoV-2 viral copies this assay can detect is 250 copies / mL. A negative result does not preclude SARS-CoV-2 infection and should not be used as the sole basis for treatment or other patient management decisions.  A negative result may occur with improper specimen collection / handling, submission of specimen other than nasopharyngeal swab, presence of viral mutation(s) within the  areas targeted by this assay, and inadequate number of viral copies (<250 copies / mL). A negative result must be combined with clinical observations, patient history, and epidemiological information.  Fact Sheet for Patients:   StrictlyIdeas.no  Fact Sheet for Healthcare Providers: BankingDealers.co.za  This test is not yet approved or  cleared by the [place] FDA and has been authorized for detection and/or diagnosis of SARS-CoV-2 by FDA under an  Emergency Use Authorization (EUA).  This EUA will remain in effect (meaning this test can be used) for the duration of the COVID-19 declaration under Section 564(b)(1) of the Act, 21 U.S.C. section 360bbb-3(b)(1), unless the authorization is terminated or revoked sooner.  Performed at [facility], [address] [place], [place] [address]   Blood culture (routine x 2)     Status: None (Preliminary result)   Collection Time: 10/16/19  5:20 AM   Specimen: BLOOD LEFT ARM  Result Value Ref Range Status   Specimen Description   Final    BLOOD LEFT ARM Performed at [place] [address], [place], [place] [address]    Special Requests   Final    BOTTLES DRAWN AEROBIC AND ANAEROBIC Blood Culture results may not be optimal due to an inadequate volume of blood received in culture bottles Performed at [facility] [address], [place], [place] [address]    Culture   Final    NO GROWTH < 24 HOURS Performed at [facility] [address], [place], [place] [address]    Report Status PENDING  Incomplete  Body fluid culture (includes gram stain)     Status: None (Preliminary result)   Collection Time: 10/16/19  7:48 AM   Specimen: Pleural Fluid  Result Value Ref Range Status   Specimen Description   Final    PLEURAL Performed at [place] [address] [place], [place] [address]    Special Requests   Final    NONE Performed at [facility], [place] [address], [place], [place] [address]    Gram Stain   Final    RARE WBC PRESENT, PREDOMINANTLY MONONUCLEAR NO ORGANISMS SEEN    Culture   Final    NO GROWTH < 24 HOURS Performed at [facility], [place] [address] [place], [place] [address]    Report Status PENDING  Incomplete  Blood culture (routine x 2)     Status: None (Preliminary result)   Collection Time: 10/16/19  9:42 PM   Specimen: BLOOD  Result Value Ref Range Status    Specimen Description   Final    BLOOD LEFT HAND Performed at [facility] [address], [place], [place] [address]    Special Requests   Final    BOTTLES DRAWN AEROBIC ONLY Blood Culture adequate volume Performed at [facility] [address] [place], [place] [address]    Culture   Final    NO GROWTH < 12 HOURS Performed at [place] [address] [place], [place] [address]    Report Status PENDING  Incomplete      Studies:  DG Chest 2 View  Result Date: 10/16/2019 CLINICAL DATA:  Chest pain, thoracentesis and chest tube insertion 2 days prior EXAM: CHEST - 2 VIEW COMPARISON:  Radiograph 10/14/2019 FINDINGS: Now complete opacification of the left hemithorax with nonvisualization of the previously seen aerated portion of the left upper lung likely reflecting  progressive atelectatic collapse and accumulation of pleural fluid. There is some increasingly consolidative opacity in the right lung base with air bronchograms. No visible pneumothorax or right effusion. Cardiomediastinal contours are largely obscured. The right heart border is grossly unchanged from prior. No acute osseous or soft tissue abnormality. Degenerative changes are present in the imaged spine and shoulders. IMPRESSION: 1. Complete opacification of the left hemithorax with nonvisualization of the previously seen aerated portion of the left upper lung, likely reflecting progressive atelectatic collapse and accumulation of pleural fluid. 2. Increasingly consolidative opacity in the right lung base with air bronchograms. Could reflect acute infection or inflammation, possibly pneumonia or sequela of aspiration. Electronically Signed   By: Lovena Le M.D.   On: 10/16/2019 00:12   DG Chest Port 1 View  Result Date: 10/17/2019 CLINICAL DATA:  Pleural effusion EXAM: PORTABLE CHEST 1 VIEW COMPARISON:  Yesterday FINDINGS: White out of the left chest which has further  progressed from yesterday. There is mass effect with rightward mediastinal displacement. Asymmetric right apical opacity from uncertain cause. IMPRESSION: Worsening opacification of the left chest with white out and increased mediastinal mass effect, consistent with large volume pleural effusion. A left tunneled pleural catheter is in similar position. Electronically Signed   By: Monte Fantasia M.D.   On: 10/17/2019 05:02   DG Chest Port 1 View  Result Date: 10/16/2019 CLINICAL DATA:  History of breast cancer. Chest pain and shortness of breath. EXAM: PORTABLE CHEST 1 VIEW COMPARISON:  Earlier same day FINDINGS: Extensive artifact overlies the chest. Left internal jugular central line catheter in the SVC at the azygos level. Right chest remains clear. Chest tube in place at the inferior pleural space on the left. Large left effusion persists with near complete atelectasis of the left lung. IMPRESSION: New left internal jugular central line tip in the SVC at the azygos level. No other change. Left chest tube. Large left effusion with left lung atelectasis. Electronically Signed   By: Nelson Chimes M.D.   On: 10/16/2019 15:52   DG CHEST PORT 1 VIEW  Result Date: 10/16/2019 CLINICAL DATA:  Left pleural effusion EXAM: PORTABLE CHEST 1 VIEW COMPARISON:  October 16, 2019 study obtained earlier in the day FINDINGS: There remains extensive opacification on the left due to combination of left pleural effusion and airspace atelectasis/consolidation. The right lung is clear. Heart is prominent and stable. No new opacity evident. The visualized pulmonary vascularity is unremarkable. No adenopathy appreciable in areas that can be assessed with respect to potential adenopathy. No bone lesions. No pneumothorax. IMPRESSION: Extensive opacity again noted on the left due to combination of effusion and atelectasis/consolidation. Right lung clear. Stable cardiac silhouette. No pneumothorax. Electronically Signed   By: Lowella Grip III M.D.   On: 10/16/2019 10:21   [place] EKG SITE RITE  Result Date: 10/16/2019 If Site Rite image not attached, placement could not be confirmed due to current cardiac rhythm.   Assessment: 59 y.o. [place], [place] woman status post right breast biopsy x2 on 07/10/2018 for multicentric invasive ductal carcinoma, clinically T1c N1, stage IIB, grade 3, functionally triple negative, with an MIB-1 of 70%             (a) right axillary lymph node biopsied at the same time was positive  (1) neoadjuvant chemotherapy consisting of doxorubicin and cyclophosphamide in dose dense fashion x4 started 08/07/2018, completed 09/16/2018, followed by weekly Abraxane and carboplatin x12 starting 08/07/2018, stopped after 2 cycles (last dose on 10/15/2018) due to  peripheral neuropathy   (2) Right breast lumpectomy on 12/27/2018 shows a ypT1b ypN0 residual invasive ductal carcinoma, margins negative.             (a) 5 sentinel nodes were negative.              (b) Estrogen receptor 75% positive, weak, Progesterone receptor negative.  (3) adjuvant radiation  Radiation Treatment Dates: 02/18/2019 through 04/02/2019 Site Technique Total Dose (Gy) Dose per Fx (Gy) Completed Fx Beam Energies  Breast: Breast_Rt 3D 50.4/50.4 1.8 28/28 6X, 10X  Breast: Breast_Rt_axilla 3D 50.4/50.4 1.8 28/28 6X, 10X  Breast: Breast_Rt_Bst 3D 20/20 2 10/10 6X, 10X    (4) genetics testing 08/06/2018 through the Breast + GYN Cancers Panel offered by [facility] found no deleterious mutations in ATM, BARD1, BRCA1, BRCA2, BRIP1, CDH1, CHEK2, DICER1, EPCAM, MLH1, MSH2, MSH6, NBN, NF1, PALB2, PMS2, PTEN, RAD50, RAD51C, RAD51D, SMARCA4, STK11, TP53.   (5) anastrozole started 05/14/2018             (a) bone density 09/22/2019 shows a T score of -0.8 (normal).  METASTATIC DISEASE: June 2021 (6) CT renalstonestudy 09/30/2019 to evaluate left flank pain shows left pleural effusion and possible liver lesions (a)cytology from  the left pleural effusion 10/03/2019 confirms metastatic disease (b)liver MRI 10/04/2019 shows multiple liver lesions (c) prognostic panel requested 10/10/2019 on cytology from 10/03/2019 fluid  (7) left effusion: left pleurx placement 10/13/2019   PLAN: Autumn Cook is already feeling better but she says the antibiotics are tearing up her stomach and her aunt present in the room states that the patient needs something to coat her throat.  I am going to write for Carafate liquid for her.  The patient's children are trying to set up a second opinion.  I have let the patient know we will be glad to assist in any way we can to get that done as fast as possible and that in any case I will be glad to be her oncologist here.  We currently have her set up to start capecitabine next week and she has an appointment with [place] 10/21/2019.  If she is discharged over the weekend which I do not think will be the case she will see me then but otherwise I will follow up with her on Monday.  Please let me know if I could be of further help at this point         Chauncey Cruel, MD 10/17/2019  8:25 AM Medical Oncology and Hematology [facility] [place], [place] [address] Tel. [phone]    Fax. [phone]

## 2019-10-17 NOTE — Progress Notes (Signed)
Pt refused am dose af Cefepime. She states it's  Making her stomach hurt. (cramp)

## 2019-10-18 ENCOUNTER — Other Ambulatory Visit: Payer: Self-pay | Admitting: Family Medicine

## 2019-10-18 DIAGNOSIS — I10 Essential (primary) hypertension: Secondary | ICD-10-CM

## 2019-10-18 LAB — MAGNESIUM: Magnesium: 1.9 mg/dL (ref 1.7–2.4)

## 2019-10-18 LAB — CBC WITH DIFFERENTIAL/PLATELET
Abs Immature Granulocytes: 0.07 10*3/uL (ref 0.00–0.07)
Basophils Absolute: 0 10*3/uL (ref 0.0–0.1)
Basophils Relative: 0 %
Eosinophils Absolute: 0.1 10*3/uL (ref 0.0–0.5)
Eosinophils Relative: 1 %
HCT: 29.7 % — ABNORMAL LOW (ref 36.0–46.0)
Hemoglobin: 9.5 g/dL — ABNORMAL LOW (ref 12.0–15.0)
Immature Granulocytes: 1 %
Lymphocytes Relative: 10 %
Lymphs Abs: 0.8 10*3/uL (ref 0.7–4.0)
MCH: 28.6 pg (ref 26.0–34.0)
MCHC: 32 g/dL (ref 30.0–36.0)
MCV: 89.5 fL (ref 80.0–100.0)
Monocytes Absolute: 0.6 10*3/uL (ref 0.1–1.0)
Monocytes Relative: 8 %
Neutro Abs: 5.9 10*3/uL (ref 1.7–7.7)
Neutrophils Relative %: 80 %
Platelets: 389 10*3/uL (ref 150–400)
RBC: 3.32 MIL/uL — ABNORMAL LOW (ref 3.87–5.11)
RDW: 13.4 % (ref 11.5–15.5)
WBC: 7.4 10*3/uL (ref 4.0–10.5)
nRBC: 0 % (ref 0.0–0.2)

## 2019-10-18 LAB — GLUCOSE, CAPILLARY
Glucose-Capillary: 132 mg/dL — ABNORMAL HIGH (ref 70–99)
Glucose-Capillary: 132 mg/dL — ABNORMAL HIGH (ref 70–99)
Glucose-Capillary: 135 mg/dL — ABNORMAL HIGH (ref 70–99)
Glucose-Capillary: 137 mg/dL — ABNORMAL HIGH (ref 70–99)
Glucose-Capillary: 143 mg/dL — ABNORMAL HIGH (ref 70–99)
Glucose-Capillary: 188 mg/dL — ABNORMAL HIGH (ref 70–99)

## 2019-10-18 LAB — COMPREHENSIVE METABOLIC PANEL
ALT: 22 U/L (ref 0–44)
AST: 32 U/L (ref 15–41)
Albumin: 2.4 g/dL — ABNORMAL LOW (ref 3.5–5.0)
Alkaline Phosphatase: 51 U/L (ref 38–126)
Anion gap: 11 (ref 5–15)
BUN: 17 mg/dL (ref 6–20)
CO2: 24 mmol/L (ref 22–32)
Calcium: 8.9 mg/dL (ref 8.9–10.3)
Chloride: 104 mmol/L (ref 98–111)
Creatinine, Ser: 0.62 mg/dL (ref 0.44–1.00)
GFR calc Af Amer: >60 mL/min (ref >60)
GFR calc non Af Amer: >60 mL/min (ref >60)
Glucose, Bld: 144 mg/dL — ABNORMAL HIGH (ref 70–99)
Potassium: 4.1 mmol/L (ref 3.5–5.1)
Sodium: 139 mmol/L (ref 135–145)
Total Bilirubin: 0.3 mg/dL (ref 0.3–1.2)
Total Protein: 5.7 g/dL — ABNORMAL LOW (ref 6.5–8.1)

## 2019-10-18 MED ORDER — FENTANYL 25 MCG/HR TD PT72
1.0000 | MEDICATED_PATCH | TRANSDERMAL | Status: DC
  Administered 2019-10-18 – 2019-11-11 (×9): 1 via TRANSDERMAL
  Filled 2019-10-18 (×9): qty 1

## 2019-10-18 MED ORDER — ONDANSETRON HCL 4 MG/2ML IJ SOLN
4.0000 mg | Freq: Four times a day (QID) | INTRAMUSCULAR | Status: DC | PRN
  Administered 2019-10-18 – 2019-10-21 (×4): 4 mg via INTRAVENOUS
  Filled 2019-10-18 (×4): qty 2

## 2019-10-18 NOTE — Progress Notes (Signed)
PROGRESS NOTE    JHAYLA PODGORSKI  UXL:[id number] DOB: Aug 10, 1960 DOA: 10/16/2019 PCP: Darreld Mclean, MD    Brief Narrative:Patient is a 59 year old female with history of right breast cancer, hypertension, hyperlipidemia, type 2 diabetes mellitus, asthma who presents to the emergency department with complaints of central sharp chest pain as well as shortness of breath.  She was recently admitted here and discharged 2 days ago when she underwent Pleurx catheter placement by PCCM after she was found to have large left pleural effusion secondary to her breast cancer.  Patient did not know how to use the Pleurx catheter at home.  On presentation, chest x-ray showed complete widening of the left lung but there was also suspicion for new consolidations on the right side concerning for pneumonia for which she was started on antibiotics.  Currently she is hemodynamically stable.  Assessment & Plan:   Active Problems:   Hypertension   History of tobacco use   Hypercholesterolemia   Malignant neoplasm of overlapping sites of right breast in female, estrogen receptor positive ([place])   Encounter for central line placement   Malignant pleural effusion   Metastases to the liver [facility])   Acute respiratory failure (HCC)   Diabetes mellitus type 2, uncontrolled, with complications (HCC)   Acute respiratory failure with hypoxia ([place])   HCAP (healthcare-associated pneumonia)   Acute on chronic respiratory failure with hypoxia: Secondary to left-sided extensive malignant pleural effusion.  She is on 2 L of oxygen at home at baseline.  She was recently admitted here and underwent left-sided Pleurx catheter but she did not know how to operate it at home.  Currently her respiratory status is at baseline.  She is not dyspneic or in any kind of respiratory distress.  CXR 10/17/2019 showed worsening opacification of the left chest with white out and increased mediastinal mass effect, consistent with large volume  pleural effusion.  She is s/p moval of 650 mL of serosanguineous fluid through the Pleurx catheter on 10/16/2019.   Pleurx catheter needs to be drained as needed at home. PCCM has arranged home health agency to educate the patient and the family about Pleurx catheter management at home.  Suspected HCAP: CXR on  presentation showed possible consolidations on the right side.She is not febrile or septic.  She was started on cefepime by admitting physician.  Chest x-ray done yesterday showed the right lung was clear.  Antibiotics have been stopped as there was very low suspicion for pneumonia.  Procalcitonin also was negative.  Malignant neoplasm of the right breast: [name] is following.  Not started on chemotherapy yet.  Hypertension: Blood pressure 137/96.  Currently blood pressure stable.  Continue current medication  Diabetes mellitus: A1c of 7.8.  Continue sliding scale insulin for now.  She is not on any medications at home.  We will consider putting her on Metformin 500 mg twice a day on discharge. CBG (last 3)  Recent Labs    10/18/19 0005 10/18/19 0349 10/18/19 0734  GLUCAP 143* 137* 135*     Hypomagnesemia: Repleted potassium 4.1 magnesium 1.9.  Supplemented with potassium.  Goals of care: Palliative care was consulted on her last admission.  Remains full code.  Pain control-cussed with her daughter at length over the phone.  Family is concerned that patient is always in 10 out of 10 pain.  She is getting Norco and morphine as needed.  They are worried that patient is never pain-free or even at a level that she can  tolerate her pain and still be able to function. Family reports she cannot tolerate OxyContin. I explained to them that she needs to be on a long-acting agent for her pain to be under control with breakthrough narcotics.  family agreed on starting fentanyl patch for long-acting agent. Start bowel regime   Estimated body mass index is 28.57 kg/m as  calculated from the following:   Height as of this encounter: 5\' 6"  (1.676 m).   Weight as of this encounter: 80.3 kg.  DVT prophylaxis: Lovenox Code Status: Full Family Communication:  Discussed with daughter over the phone Status is: Inpatient  Remains inpatient appropriate because:Unsafe d/c plan   Dispo: The patient is from: Home  Anticipated d/c is to: Home  Anticipated d/c date is: 2 day  Patient currently is not medically stable to d/c.  Patient did not feel ready to go home today.  Pain control remains an issue she is still requiring IV morphine.  Consultants: PCCM  Procedures:None  Antimicrobials:  Anti-infectives (From admission, onward)   Start     Dose/Rate Route Frequency Ordered Stop   10/16/19 1400  ceFEPIme (MAXIPIME) 2 g in sodium chloride 0.9 % 100 mL IVPB  Status:  Discontinued        2 g 200 mL/hr over 30 Minutes Intravenous Every 8 hours 10/16/19 0817 10/17/19 1426   10/16/19 0530  vancomycin (VANCOCIN) IVPB 1000 mg/200 mL premix        1,000 mg 200 mL/hr over 60 Minutes Intravenous  Once 10/16/19 0520 10/16/19 0741   10/16/19 0530  ceFEPIme (MAXIPIME) 2 g in sodium chloride 0.9 % 100 mL IVPB        2 g 200 mL/hr over 30 Minutes Intravenous  Once 10/16/19 0520 10/16/19 0741        Subjective: Resting in bed in nad..denies  Sob  No bms  Objective: Vitals:   10/17/19 2100 10/18/19 0351 10/18/19 0830 10/18/19 0835  BP: (!) 129/91 (!) 137/96    Pulse: 88 75    Resp: 20 17    Temp: 98.7 F (37.1 C) 97.6 F (36.4 C)    TempSrc: Oral Oral    SpO2: 100% 100% 95% 95%  Weight:      Height:        Intake/Output Summary (Last 24 hours) at 10/18/2019 1049 Last data filed at 10/18/2019 0000 Gross per 24 hour  Intake 100 ml  Output 30 ml  Net 70 ml   Filed Weights   10/16/19 0431  Weight: 80.3 kg    Examination:  General exam: Appears calm and comfortable  Respiratory system: diminished at bases   to auscultation. Respiratory effort normal. Cardiovascular system: S1 & S2 heard, RRR. No JVD, murmurs, rubs, gallops or clicks. No pedal edema. Gastrointestinal system: Abdomen is nondistended, soft and nontender. No organomegaly or masses felt. Normal bowel sounds heard. Central nervous system: Alert and oriented. No focal neurological deficits. Extremities: Symmetric 5 x 5 power. Skin: No rashes, lesions or ulcers Psychiatry: Judgement and insight appear normal. Mood & affect appropriate.     Data Reviewed: I have personally reviewed following labs and imaging studies  CBC: Recent Labs  Lab 10/13/19 0600 10/13/19 0600 10/14/19 0242 10/16/19 0043 10/16/19 1608 10/17/19 0519 10/18/19 0338  WBC 10.9*   < > 8.8 7.6 6.9 6.9 7.4  NEUTROABS 8.9*  --  6.7 5.9  --  5.1 5.9  HGB 11.4*   < > 10.7* 10.7* 10.2* 10.4* 9.5*  HCT 34.6*   < >  32.4* 32.8* 30.9* 31.2* 29.7*  MCV 89.9   < > 88.8 89.1 89.6 89.7 89.5  PLT 401*   < > 339 375 363 377 389   < > = values in this interval not displayed.   Basic Metabolic Panel: Recent Labs  Lab 10/13/19 0600 10/13/19 0600 10/14/19 0242 10/16/19 0043 10/16/19 1608 10/17/19 0519 10/18/19 0338  NA 137  --  138 136  --  137 139  K 4.3  --  4.2 4.5  --  3.9 4.1  CL 102  --  104 102  --  102 104  CO2 21*  --  23 24  --  21* 24  GLUCOSE 291*  --  174* 200*  --  166* 144*  BUN 17  --  20 17  --  17 17  CREATININE 0.72   < > 0.87 0.78 0.62 0.67 0.62  CALCIUM 9.4  --  9.1 9.0  --  9.0 8.9  MG  --   --   --   --   --  1.6* 1.9  PHOS  --   --   --   --   --  3.0  --    < > = values in this interval not displayed.   GFR: Estimated Creatinine Clearance: 82.9 mL/min (by C-G formula based on SCr of 0.62 mg/dL). Liver Function Tests: Recent Labs  Lab 10/16/19 1608 10/17/19 0519 10/18/19 0338  AST  --  27 32  ALT  --  16 22  ALKPHOS  --  49 51  BILITOT  --  0.4 0.3  PROT 5.9* 5.9* 5.7*  ALBUMIN 2.6* 2.6* 2.4*   No results for input(s):  LIPASE, AMYLASE in the last 168 hours. No results for input(s): AMMONIA in the last 168 hours. Coagulation Profile: No results for input(s): INR, PROTIME in the last 168 hours. Cardiac Enzymes: No results for input(s): CKTOTAL, CKMB, CKMBINDEX, TROPONINI in the last 168 hours. BNP (last 3 results) No results for input(s): PROBNP in the last 8760 hours. HbA1C: No results for input(s): HGBA1C in the last 72 hours. CBG: Recent Labs  Lab 10/17/19 1544 10/17/19 2007 10/18/19 0005 10/18/19 0349 10/18/19 0734  GLUCAP 138* 174* 143* 137* 135*   Lipid Profile: Recent Labs    10/17/19 0519  CHOL 150  HDL 33*  LDLCALC 93  TRIG 121  CHOLHDL 4.5   Thyroid Function Tests: No results for input(s): TSH, T4TOTAL, FREET4, T3FREE, THYROIDAB in the last 72 hours. Anemia Panel: No results for input(s): VITAMINB12, FOLATE, FERRITIN, TIBC, IRON, RETICCTPCT in the last 72 hours. Sepsis Labs: Recent Labs  Lab 10/16/19 1608 10/17/19 0519  PROCALCITON <0.10 <0.10  LATICACIDVEN 1.7  --     Recent Results (from the past 240 hour(s))  Culture, blood (routine x 2)     Status: None   Collection Time: 10/09/19  5:02 PM   Specimen: BLOOD  Result Value Ref Range Status   Specimen Description   Final    BLOOD LEFT ANTECUBITAL Performed at [facility], [address] [place], [place] [id number]    Special Requests   Final    BOTTLES DRAWN AEROBIC AND ANAEROBIC Blood Culture adequate volume Performed at [facility], [address] [place], [place] [address]    Culture   Final    NO GROWTH 5 DAYS Performed at [facility], [place] [address] [place], [place] [address]    Report Status 10/14/2019  FINAL  Final  Culture, blood (routine x 2)     Status: None   Collection Time: 10/09/19  5:04 PM   Specimen: BLOOD  Result Value Ref Range Status   Specimen Description   Final    BLOOD BLOOD LEFT FOREARM Performed at [facility], [address] [place], [place] [address]    Special Requests   Final    BOTTLES DRAWN AEROBIC ONLY Blood Culture results may not be optimal due to an inadequate volume of blood received in culture bottles Performed at [facility], [address] [place], [place] [address]    Culture   Final    NO GROWTH 5 DAYS Performed at [facility], [place] [address] [place], [place] [address]    Report Status 10/14/2019 FINAL  Final  SARS Coronavirus 2 by RT PCR (hospital order, performed in [facility] hospital lab) Nasopharyngeal Nasopharyngeal Swab     Status: None   Collection Time: 10/09/19  5:29 PM   Specimen: Nasopharyngeal Swab  Result Value Ref Range Status   SARS Coronavirus 2 NEGATIVE NEGATIVE Final    Comment: (NOTE) SARS-CoV-2 target nucleic acids are NOT DETECTED. The SARS-CoV-2 RNA is generally detectable in upper and lower respiratory specimens during the acute phase of infection. The lowest concentration of SARS-CoV-2 viral copies this assay can detect is 250 copies / mL. A negative result does not preclude SARS-CoV-2 infection and should not be used as the sole basis for treatment or other patient management decisions.  A negative result may occur with improper specimen collection / handling, submission of specimen other than nasopharyngeal swab, presence of viral mutation(s) within the areas targeted by this assay, and inadequate number of viral copies (<250 copies / mL). A negative result must be combined with clinical observations, patient history, and epidemiological information. Fact Sheet for Patients:   StrictlyIdeas.no Fact Sheet for Healthcare Providers: BankingDealers.co.za This test is not yet approved or cleared  by the [place] FDA and has been authorized for detection and/or diagnosis of SARS-CoV-2 by FDA under an Emergency Use Authorization (EUA).  This EUA will remain in effect (meaning this test can be used) for the  duration of the COVID-19 declaration under Section 564(b)(1) of the Act, 21 U.S.C. section 360bbb-3(b)(1), unless the authorization is terminated or revoked sooner. Performed at [facility], [address] [place], [place] [address]   MRSA PCR Screening     Status: None   Collection Time: 10/13/19  9:37 AM   Specimen: Nasal Mucosa; Nasopharyngeal  Result Value Ref Range Status   MRSA by PCR NEGATIVE NEGATIVE Final    Comment:        The GeneXpert MRSA Assay (FDA approved for NASAL specimens only), is one component of a comprehensive MRSA colonization surveillance program. It is not intended to diagnose MRSA infection nor to guide or monitor treatment for MRSA infections. Performed at [facility], [place] [address], [place], [place] [address]   SARS Coronavirus 2 by RT PCR (hospital order, performed in [facility] hospital lab) Nasopharyngeal Nasopharyngeal Swab     Status: None   Collection Time: 10/16/19 12:43 AM   Specimen: Nasopharyngeal Swab  Result Value Ref Range Status   SARS Coronavirus 2 NEGATIVE NEGATIVE Final    Comment: (NOTE) SARS-CoV-2 target nucleic acids are NOT DETECTED.  The SARS-CoV-2 RNA is generally detectable in upper and lower respiratory specimens during the acute phase of infection.  The lowest concentration of SARS-CoV-2 viral copies this assay can detect is 250 copies / mL. A negative result does not preclude SARS-CoV-2 infection and should not be used as the sole basis for treatment or other patient management decisions.  A negative result may occur with improper specimen collection / handling, submission of specimen other than nasopharyngeal swab, presence of viral mutation(s) within the areas targeted by this assay, and inadequate number of viral copies (<250 copies / mL). A negative result must be combined with clinical observations, patient history, and epidemiological information.  Fact Sheet for Patients:     StrictlyIdeas.no  Fact Sheet for Healthcare Providers: BankingDealers.co.za  This test is not yet approved or  cleared by the [place] FDA and has been authorized for detection and/or diagnosis of SARS-CoV-2 by FDA under an Emergency Use Authorization (EUA).  This EUA will remain in effect (meaning this test can be used) for the duration of the COVID-19 declaration under Section 564(b)(1) of the Act, 21 U.S.C. section 360bbb-3(b)(1), unless the authorization is terminated or revoked sooner.  Performed at [facility], [address] [place], [place] [address]   Blood culture (routine x 2)     Status: None (Preliminary result)   Collection Time: 10/16/19  5:20 AM   Specimen: BLOOD LEFT ARM  Result Value Ref Range Status   Specimen Description   Final    BLOOD LEFT ARM Performed at [facility] [address], [place], [place] [address]    Special Requests   Final    BOTTLES DRAWN AEROBIC AND ANAEROBIC Blood Culture results may not be optimal due to an inadequate volume of blood received in culture bottles Performed at [facility] [address], [place], [place] [address]    Culture   Final    NO GROWTH 2 DAYS Performed at [facility] [address], [place], [place] [address]    Report Status PENDING  Incomplete  Body fluid culture (includes gram stain)     Status: None (Preliminary result)   Collection Time: 10/16/19  7:48 AM   Specimen: Pleural Fluid  Result Value Ref Range Status   Specimen Description   Final    PLEURAL Performed at [facility] [address], [place], [place] [address]    Special Requests   Final    NONE Performed at [facility], [place] [address] [place], [place] [address]    Gram Stain   Final    RARE WBC PRESENT, PREDOMINANTLY MONONUCLEAR NO ORGANISMS SEEN    Culture   Final    NO GROWTH 2  DAYS Performed at [facility], [place] [address], [place], [place] [address]    Report Status PENDING  Incomplete  Blood culture (routine x 2)     Status: None (Preliminary result)   Collection Time: 10/16/19  9:42 PM   Specimen: BLOOD  Result Value Ref Range Status   Specimen Description   Final    BLOOD LEFT HAND Performed at [facility] [address], [place], [place] [address]    Special Requests   Final    BOTTLES DRAWN AEROBIC ONLY Blood Culture adequate volume Performed at [facility] [address], [place], [place] [address]    Culture   Final    NO GROWTH 1 DAY Performed at [facility], [place] [address], [place],  [address]    Report Status  PENDING  Incomplete         Radiology Studies: DG Chest Port 1 View  Result Date: 10/17/2019 CLINICAL DATA:  Pleural effusion EXAM: PORTABLE CHEST 1 VIEW COMPARISON:  Yesterday FINDINGS: White out of the left chest which has further progressed from yesterday. There is mass effect with rightward mediastinal displacement. Asymmetric right apical opacity from uncertain cause. IMPRESSION: Worsening opacification of the left chest with white out and increased mediastinal mass effect, consistent with large volume pleural effusion. A left tunneled pleural catheter is in similar position. Electronically Signed   By: Monte Fantasia M.D.   On: 10/17/2019 05:02   DG Chest Port 1 View  Result Date: 10/16/2019 CLINICAL DATA:  History of breast cancer. Chest pain and shortness of breath. EXAM: PORTABLE CHEST 1 VIEW COMPARISON:  Earlier same day FINDINGS: Extensive artifact overlies the chest. Left internal jugular central line catheter in the SVC at the azygos level. Right chest remains clear. Chest tube in place at the inferior pleural space on the left. Large left effusion persists with near complete atelectasis of the left lung. IMPRESSION: New left internal jugular central line tip in  the SVC at the azygos level. No other change. Left chest tube. Large left effusion with left lung atelectasis. Electronically Signed   By: Nelson Chimes M.D.   On: 10/16/2019 15:52        Scheduled Meds: . atenolol  100 mg Oral Daily  . Chlorhexidine Gluconate Cloth  6 each Topical Daily  . enoxaparin (LOVENOX) injection  40 mg Subcutaneous Q24H  . insulin aspart  0-15 Units Subcutaneous Q4H  . levalbuterol  1.25 mg Nebulization BID  . sodium chloride flush  10-40 mL Intracatheter Q12H  . sucralfate  1 g Oral TID WC & HS   Continuous Infusions:   LOS: 2 days     Georgette Shell, MD  10/18/2019, 10:49 AM

## 2019-10-19 ENCOUNTER — Inpatient Hospital Stay (HOSPITAL_COMMUNITY): Payer: 59

## 2019-10-19 LAB — COMPREHENSIVE METABOLIC PANEL
ALT: 20 U/L (ref 0–44)
AST: 32 U/L (ref 15–41)
Albumin: 2.6 g/dL — ABNORMAL LOW (ref 3.5–5.0)
Alkaline Phosphatase: 56 U/L (ref 38–126)
Anion gap: 8 (ref 5–15)
BUN: 17 mg/dL (ref 6–20)
CO2: 27 mmol/L (ref 22–32)
Calcium: 9.2 mg/dL (ref 8.9–10.3)
Chloride: 104 mmol/L (ref 98–111)
Creatinine, Ser: 0.78 mg/dL (ref 0.44–1.00)
GFR calc Af Amer: >60 mL/min (ref >60)
GFR calc non Af Amer: >60 mL/min (ref >60)
Glucose, Bld: 141 mg/dL — ABNORMAL HIGH (ref 70–99)
Potassium: 4.9 mmol/L (ref 3.5–5.1)
Sodium: 139 mmol/L (ref 135–145)
Total Bilirubin: 0.5 mg/dL (ref 0.3–1.2)
Total Protein: 6.2 g/dL — ABNORMAL LOW (ref 6.5–8.1)

## 2019-10-19 LAB — GLUCOSE, CAPILLARY
Glucose-Capillary: 110 mg/dL — ABNORMAL HIGH (ref 70–99)
Glucose-Capillary: 120 mg/dL — ABNORMAL HIGH (ref 70–99)
Glucose-Capillary: 121 mg/dL — ABNORMAL HIGH (ref 70–99)
Glucose-Capillary: 140 mg/dL — ABNORMAL HIGH (ref 70–99)
Glucose-Capillary: 161 mg/dL — ABNORMAL HIGH (ref 70–99)
Glucose-Capillary: 170 mg/dL — ABNORMAL HIGH (ref 70–99)

## 2019-10-19 LAB — CBC WITH DIFFERENTIAL/PLATELET
Abs Immature Granulocytes: 0.1 10*3/uL — ABNORMAL HIGH (ref 0.00–0.07)
Basophils Absolute: 0 10*3/uL (ref 0.0–0.1)
Basophils Relative: 0 %
Eosinophils Absolute: 0.2 10*3/uL (ref 0.0–0.5)
Eosinophils Relative: 2 %
HCT: 29.5 % — ABNORMAL LOW (ref 36.0–46.0)
Hemoglobin: 9.6 g/dL — ABNORMAL LOW (ref 12.0–15.0)
Immature Granulocytes: 1 %
Lymphocytes Relative: 15 %
Lymphs Abs: 1.2 10*3/uL (ref 0.7–4.0)
MCH: 29.3 pg (ref 26.0–34.0)
MCHC: 32.5 g/dL (ref 30.0–36.0)
MCV: 89.9 fL (ref 80.0–100.0)
Monocytes Absolute: 0.8 10*3/uL (ref 0.1–1.0)
Monocytes Relative: 9 %
Neutro Abs: 5.9 10*3/uL (ref 1.7–7.7)
Neutrophils Relative %: 73 %
Platelets: 384 10*3/uL (ref 150–400)
RBC: 3.28 MIL/uL — ABNORMAL LOW (ref 3.87–5.11)
RDW: 13.3 % (ref 11.5–15.5)
WBC: 8.2 10*3/uL (ref 4.0–10.5)
nRBC: 0 % (ref 0.0–0.2)

## 2019-10-19 LAB — CHOLESTEROL, BODY FLUID: Cholesterol, Fluid: 77 mg/dL

## 2019-10-19 LAB — BODY FLUID CULTURE: Culture: NO GROWTH

## 2019-10-19 LAB — MAGNESIUM: Magnesium: 1.8 mg/dL (ref 1.7–2.4)

## 2019-10-19 NOTE — Progress Notes (Signed)
PROGRESS NOTE    Autumn Cook  EVO:[id number] DOB: 1961/02/25 DOA: 10/16/2019 PCP: Darreld Mclean, MD  Brief Narrative:Patient is a 59 year old female with history of right breast cancer, hypertension, hyperlipidemia, type 2 diabetes mellitus, asthma who presents to the emergency department with complaints of central sharp chest pain as well as shortness of breath. She was recently admitted here and discharged 2 days ago when she underwent Pleurx catheter placement by PCCM after she was found to have large left pleural effusion secondary to her breast cancer. Patient did not know how to use the Pleurx catheter at home. On presentation, chest x-ray showed complete widening of the left lung but there was also suspicion for new consolidations on the right side concerning for pneumonia for which she was started on antibiotics.Currently she is hemodynamically stable  Assessment & Plan:   Active Problems:   Hypertension   History of tobacco use   Hypercholesterolemia   Malignant neoplasm of overlapping sites of right breast in female, estrogen receptor positive ([place])   Encounter for central line placement   Malignant pleural effusion   Recurrent left pleural effusion   Metastases to the liver [facility])   Acute respiratory failure (HCC)   Diabetes mellitus type 2, uncontrolled, with complications (HCC)   Acute respiratory failure with hypoxia (HCC)   Pneumonia of right lower lobe due to infectious organism   Acute on chronic respiratory failure with hypoxia: Secondary to left-sided extensivemalignantpleural effusion. She is on 2 L of oxygen at home at baseline. She was recently admitted here and underwent left-sided Pleurx catheter but she did not know how to operate it at home. Currently her respiratory status is at baseline. She is not dyspneic or in any kind of respiratory distress.  CXR 10/17/2019 showedworsening opacification of the left chest with white out and increased  mediastinal mass effect, consistent with large volume pleural effusion. She is s/pmoval of 650 mL of serosanguineous fluid through the Pleurx catheter on 10/16/2019. Pleurx catheter being drained as needed while in hospital. Pleurx catheter needs to be drained as needed at home. PCCM has arranged home health agency to educate the patient and the family about Pleurx catheter management at home.  Suspected HCAP:CXR onpresentation showed possible consolidations on the right side.She is not febrile or septic. She wasstarted on cefepime by admitting physician.Chest x-ray done yesterday showed the right lung was clear.  Antibiotics have been stopped as there was very low suspicion for pneumonia.  Procalcitonin also was negative.  Malignantneoplasm of the right breast: [name] is following. Notstarted on chemotherapy yet.  Hypertension: Blood pressure 126/86 stable on current management continue atenolol.  Diabetes mellitus: A1c of 7.8. Continue sliding scaleinsulin for now.She is not on any medications at home. We will consider putting her on Metformin 500 mg twice a day on discharge.  Hypomagnesemia: Repleted potassium 4.1 magnesium 1.9.  Supplemented with potassium.  Goals of care:Palliative care was consulted on her last admission. Remains full code.  Pain control-Discussed with her daughter at length over the phone.  Family is concerned that patient is always in 10 out of 10 pain.  She is getting Norco and morphine as needed.  They are worried that patient is never pain-free or even at a level that she can tolerate her pain and still be able to function. Family reports she cannot tolerate OxyContin. I explained to them that she needs to be on a long-acting agent for her pain to be under control with breakthrough narcotics.  family  agreed on starting fentanyl patch for long-acting agent. Started bowel regime  Estimated body mass index is 28.57 kg/m as calculated  from the following:   Height as of this encounter: 5\' 6"  (1.676 m).   Weight as of this encounter: 80.3 kg.   DVT prophylaxis:Lovenox Code Status:Full Family Communication: Discussed with daughter over the phone Status is: Inpatient  Remains inpatient appropriate because:Unsafe d/c plan   Dispo: The patient is from:Home Anticipated d/c is XT:[facility] Anticipated d/c date is: 2 day Patient currently is not medically stable to d/c.  Patient did not feel ready to go home today.  Pain control remains an issue she is still requiring IV morphine.  Consultants:PCCM  Procedures:None   Antimicrobials: Subjective:  She is resting in bed in no acute distress.  Overnight staff reported she got morphine and Norco twice overnight. She had a large bowel movement yesterday. She wants to see how the pain patch is going to work. She denies any shortness of breath or dyspnea on exertion at this time. Objective: Vitals:   10/19/19 0400 10/19/19 0845 10/19/19 0849 10/19/19 0853  BP: (!) 141/96 126/86    Pulse: 81 78    Resp: 18     Temp: 98.3 F (36.8 C)     TempSrc: Oral     SpO2: 100%  95% 95%  Weight:      Height:        Intake/Output Summary (Last 24 hours) at 10/19/2019 0854 Last data filed at 10/18/2019 1700 Gross per 24 hour  Intake 480 ml  Output --  Net 480 ml   Filed Weights   10/16/19 0431  Weight: 80.3 kg    Examination:  General exam: Appears calm and comfortable  Respiratory system: Clear to auscultation on the right Respiratory effort normal.  Drain in the left chest noted.  Decreased breath sounds on the left. Cardiovascular system: S1 & S2 heard, RRR. No JVD, murmurs, rubs, gallops or clicks. No pedal edema. Gastrointestinal system: Abdomen is nondistended, soft and nontender. No organomegaly or masses felt. Normal bowel sounds heard. Central nervous system: Alert and oriented. No focal neurological  deficits. Extremities: Symmetric 5 x 5 power. Skin: No rashes, lesions or ulcers Psychiatry: Judgement and insight appear normal. Mood & affect appropriate.     Data Reviewed: I have personally reviewed following labs and imaging studies  CBC: Recent Labs  Lab 10/14/19 0242 10/14/19 0242 10/16/19 0043 10/16/19 1608 10/17/19 0519 10/18/19 0338 10/19/19 0352  WBC 8.8   < > 7.6 6.9 6.9 7.4 8.2  NEUTROABS 6.7  --  5.9  --  5.1 5.9 5.9  HGB 10.7*   < > 10.7* 10.2* 10.4* 9.5* 9.6*  HCT 32.4*   < > 32.8* 30.9* 31.2* 29.7* 29.5*  MCV 88.8   < > 89.1 89.6 89.7 89.5 89.9  PLT 339   < > 375 363 377 389 384   < > = values in this interval not displayed.   Basic Metabolic Panel: Recent Labs  Lab 10/14/19 0242 10/14/19 0242 10/16/19 0043 10/16/19 1608 10/17/19 0519 10/18/19 0338 10/19/19 0352  NA 138  --  136  --  137 139 139  K 4.2  --  4.5  --  3.9 4.1 4.9  CL 104  --  102  --  102 104 104  CO2 23  --  24  --  21* 24 27  GLUCOSE 174*  --  200*  --  166* 144* 141*  BUN 20  --  17  --  17 17 17   CREATININE 0.87   < > 0.78 0.62 0.67 0.62 0.78  CALCIUM 9.1  --  9.0  --  9.0 8.9 9.2  MG  --   --   --   --  1.6* 1.9 1.8  PHOS  --   --   --   --  3.0  --   --    < > = values in this interval not displayed.   GFR: Estimated Creatinine Clearance: 82.9 mL/min (by C-G formula based on SCr of 0.78 mg/dL). Liver Function Tests: Recent Labs  Lab 10/16/19 1608 10/17/19 0519 10/18/19 0338 10/19/19 0352  AST  --  27 32 32  ALT  --  16 22 20   ALKPHOS  --  49 51 56  BILITOT  --  0.4 0.3 0.5  PROT 5.9* 5.9* 5.7* 6.2*  ALBUMIN 2.6* 2.6* 2.4* 2.6*   No results for input(s): LIPASE, AMYLASE in the last 168 hours. No results for input(s): AMMONIA in the last 168 hours. Coagulation Profile: No results for input(s): INR, PROTIME in the last 168 hours. Cardiac Enzymes: No results for input(s): CKTOTAL, CKMB, CKMBINDEX, TROPONINI in the last 168 hours. BNP (last 3 results) No results  for input(s): PROBNP in the last 8760 hours. HbA1C: No results for input(s): HGBA1C in the last 72 hours. CBG: Recent Labs  Lab 10/18/19 1716 10/18/19 2056 10/19/19 0005 10/19/19 0358 10/19/19 0719  GLUCAP 132* 188* 120* 140* 110*   Lipid Profile: Recent Labs    10/17/19 0519  CHOL 150  HDL 33*  LDLCALC 93  TRIG 121  CHOLHDL 4.5   Thyroid Function Tests: No results for input(s): TSH, T4TOTAL, FREET4, T3FREE, THYROIDAB in the last 72 hours. Anemia Panel: No results for input(s): VITAMINB12, FOLATE, FERRITIN, TIBC, IRON, RETICCTPCT in the last 72 hours. Sepsis Labs: Recent Labs  Lab 10/16/19 1608 10/17/19 0519  PROCALCITON <0.10 <0.10  LATICACIDVEN 1.7  --     Recent Results (from the past 240 hour(s))  Culture, blood (routine x 2)     Status: None   Collection Time: 10/09/19  5:02 PM   Specimen: BLOOD  Result Value Ref Range Status   Specimen Description   Final    BLOOD LEFT ANTECUBITAL Performed at [facility], [address] [place], [place] [address]    Special Requests   Final    BOTTLES DRAWN AEROBIC AND ANAEROBIC Blood Culture adequate volume Performed at [facility], [address] [place], [place] [address]    Culture   Final    NO GROWTH 5 DAYS Performed at [facility], [place] [address] [place], [place] [address]    Report Status 10/14/2019 FINAL  Final  Culture, blood (routine x 2)     Status: None   Collection Time: 10/09/19  5:04 PM   Specimen: BLOOD  Result Value Ref Range Status   Specimen Description   Final    BLOOD BLOOD LEFT FOREARM Performed at [facility], [address] [place], [place] [address]    Special Requests   Final    BOTTLES DRAWN AEROBIC ONLY Blood Culture results may not be optimal due to an inadequate volume of blood received in culture bottles Performed at [facility], [address] [place], [place] [address]    Culture   Final    NO GROWTH 5 DAYS Performed  at [facility], [address] [place], [place] [address]    Report Status 10/14/2019 FINAL  Final  SARS Coronavirus 2 by RT PCR (hospital order, performed in [facility] hospital lab) Nasopharyngeal Nasopharyngeal Swab     Status: None   Collection Time: 10/09/19  5:29 PM   Specimen: Nasopharyngeal Swab  Result Value Ref Range Status   SARS Coronavirus 2 NEGATIVE NEGATIVE Final    Comment: (NOTE) SARS-CoV-2 target nucleic acids are NOT DETECTED. The SARS-CoV-2 RNA is generally detectable in upper and lower respiratory specimens during the acute phase of infection. The lowest concentration of SARS-CoV-2 viral copies this assay can detect is 250 copies / mL. A negative result does not preclude SARS-CoV-2 infection and should not be used as the sole basis for treatment or other patient management decisions.  A negative result may occur with improper specimen collection / handling, submission of specimen other than nasopharyngeal swab, presence of viral mutation(s) within the areas targeted by this assay, and inadequate number of viral copies (<250 copies / mL). A negative result must be combined with clinical observations, patient history, and epidemiological information. Fact Sheet for Patients:   StrictlyIdeas.no Fact Sheet for Healthcare Providers: BankingDealers.co.za This test is not yet approved or cleared  by the [place] FDA and has been authorized for detection and/or diagnosis of SARS-CoV-2 by FDA under an Emergency Use Authorization (EUA).  This EUA will remain in effect (meaning this test can be used) for the duration of the COVID-19 declaration under Section 564(b)(1) of the Act, 21 U.S.C. section 360bbb-3(b)(1), unless the authorization is terminated or revoked sooner. Performed at [facility], [address] [place], [place] [address]   MRSA PCR Screening     Status: None   Collection Time:  10/13/19  9:37 AM   Specimen: Nasal Mucosa; Nasopharyngeal  Result Value Ref Range Status   MRSA by PCR NEGATIVE NEGATIVE Final    Comment:        The GeneXpert MRSA Assay (FDA approved for NASAL specimens only), is one component of a comprehensive MRSA colonization surveillance program. It is not intended to diagnose MRSA infection nor to guide or monitor treatment for MRSA infections. Performed at [facility], [place] [address] [place], [place] [address]   SARS Coronavirus 2 by RT PCR (hospital order, performed in [facility] hospital lab) Nasopharyngeal Nasopharyngeal Swab     Status: None   Collection Time: 10/16/19 12:43 AM   Specimen: Nasopharyngeal Swab  Result Value Ref Range Status   SARS Coronavirus 2 NEGATIVE NEGATIVE Final    Comment: (NOTE) SARS-CoV-2 target nucleic acids are NOT DETECTED.  The SARS-CoV-2 RNA is generally detectable in upper and lower respiratory specimens during the acute phase of infection. The lowest concentration of SARS-CoV-2 viral copies this assay can detect is 250 copies / mL. A negative result does not preclude SARS-CoV-2 infection and should not be used as the sole basis for treatment or other patient management decisions.  A negative result may occur with improper specimen collection / handling, submission of specimen other than nasopharyngeal swab, presence of viral mutation(s) within the areas targeted by this assay, and inadequate number of viral copies (<250 copies / mL). A negative result must be combined with clinical observations, patient history, and epidemiological information.  Fact Sheet for Patients:   StrictlyIdeas.no  Fact Sheet for Healthcare Providers: BankingDealers.co.za  This test is not yet approved or  cleared by the [place] FDA and has been authorized  for detection and/or diagnosis of SARS-CoV-2 by FDA under an Emergency Use Authorization  (EUA).  This EUA will remain in effect (meaning this test can be used) for the duration of the COVID-19 declaration under Section 564(b)(1) of the Act, 21 U.S.C. section 360bbb-3(b)(1), unless the authorization is terminated or revoked sooner.  Performed at [facility], [address] [place], [place] [address]   Blood culture (routine x 2)     Status: None (Preliminary result)   Collection Time: 10/16/19  5:20 AM   Specimen: BLOOD LEFT ARM  Result Value Ref Range Status   Specimen Description   Final    BLOOD LEFT ARM Performed at [place] [address], [place], [place] [address]    Special Requests   Final    BOTTLES DRAWN AEROBIC AND ANAEROBIC Blood Culture results may not be optimal due to an inadequate volume of blood received in culture bottles Performed at [facility] [address] [place], [place] [address]    Culture   Final    NO GROWTH 3 DAYS Performed at [facility], [place] [address], [place], [place] [address]    Report Status PENDING  Incomplete  Body fluid culture (includes gram stain)     Status: None (Preliminary result)   Collection Time: 10/16/19  7:48 AM   Specimen: Pleural Fluid  Result Value Ref Range Status   Specimen Description   Final    PLEURAL Performed at [place] [address] [place], [place] [address]    Special Requests   Final    NONE Performed at [facility], [place] [address], [place], [place] [address]    Gram Stain   Final    RARE WBC PRESENT, PREDOMINANTLY MONONUCLEAR NO ORGANISMS SEEN    Culture   Final    NO GROWTH 2 DAYS Performed at [facility], [place] [address], [place], [place] [address]    Report Status PENDING  Incomplete  Fungus Culture With Stain     Status: None (Preliminary result)   Collection Time: 10/16/19  7:48 AM   Specimen: Pleural Fluid  Result Value Ref Range Status   Fungus Stain Final report   Final    Comment: (NOTE) Performed At: [facility] [place], [place] [id number] Rush Farmer MD [id number]    Fungus (Mycology) Culture PENDING  Incomplete   Fungal Source PLEURAL  Final    Comment: Performed at [facility], [place] [address], [place], [place] [address]  Fungus Culture Result     Status: None   Collection Time: 10/16/19  7:48 AM  Result Value Ref Range Status   Result 1 Comment  Final    Comment: (NOTE) KOH/Calcofluor preparation:  no fungus observed. Performed At: [facility] [place], [place] [id number] Rush Farmer MD [id number]   Blood culture (routine x 2)     Status: None (Preliminary result)   Collection Time: 10/16/19  9:42 PM   Specimen: BLOOD  Result Value Ref Range Status   Specimen Description   Final    BLOOD LEFT HAND Performed at [facility], [place] [address] [place], [place] [address]    Special Requests   Final    BOTTLES DRAWN AEROBIC ONLY Blood Culture adequate volume Performed at [place] [address] [place], [place] [address]    Culture   Final  NO GROWTH 2 DAYS Performed at [facility], [place] [address], [place], [place] [address]    Report Status PENDING  Incomplete         Radiology Studies: DG Chest 1 View  Result Date: 10/19/2019 CLINICAL DATA:  Pleural effusion EXAM: CHEST  1 VIEW COMPARISON:  10/17/2019 FINDINGS: Unchanged position of left IJ approach central venous catheter with tip in the lower SVC. Mild cardiomegaly. The left hemithorax remains completely opacified. IMPRESSION: Unchanged complete opacification of the left hemithorax. Electronically Signed   By: Ulyses Jarred M.D.   On: 10/19/2019 04:42        Scheduled Meds: . atenolol  100 mg Oral Daily  . Chlorhexidine Gluconate Cloth  6 each Topical Daily  . enoxaparin (LOVENOX) injection  40 mg Subcutaneous Q24H  . fentaNYL  1 patch  Transdermal Q72H  . insulin aspart  0-15 Units Subcutaneous Q4H  . levalbuterol  1.25 mg Nebulization BID  . sodium chloride flush  10-40 mL Intracatheter Q12H  . sucralfate  1 g Oral TID WC & HS   Continuous Infusions:   LOS: 3 days     Georgette Shell, MD  10/19/2019, 8:54 AM

## 2019-10-20 ENCOUNTER — Encounter: Payer: Managed Care, Other (non HMO) | Admitting: Rehabilitation

## 2019-10-20 ENCOUNTER — Inpatient Hospital Stay (HOSPITAL_COMMUNITY): Payer: 59

## 2019-10-20 DIAGNOSIS — R531 Weakness: Secondary | ICD-10-CM | POA: Insufficient documentation

## 2019-10-20 DIAGNOSIS — G893 Neoplasm related pain (acute) (chronic): Secondary | ICD-10-CM

## 2019-10-20 DIAGNOSIS — R52 Pain, unspecified: Secondary | ICD-10-CM | POA: Insufficient documentation

## 2019-10-20 DIAGNOSIS — Z515 Encounter for palliative care: Secondary | ICD-10-CM

## 2019-10-20 LAB — CBC WITH DIFFERENTIAL/PLATELET
Abs Immature Granulocytes: 0.07 10*3/uL (ref 0.00–0.07)
Basophils Absolute: 0 10*3/uL (ref 0.0–0.1)
Basophils Relative: 0 %
Eosinophils Absolute: 0.1 10*3/uL (ref 0.0–0.5)
Eosinophils Relative: 1 %
HCT: 29.6 % — ABNORMAL LOW (ref 36.0–46.0)
Hemoglobin: 9.4 g/dL — ABNORMAL LOW (ref 12.0–15.0)
Immature Granulocytes: 1 %
Lymphocytes Relative: 12 %
Lymphs Abs: 1 10*3/uL (ref 0.7–4.0)
MCH: 28.9 pg (ref 26.0–34.0)
MCHC: 31.8 g/dL (ref 30.0–36.0)
MCV: 91.1 fL (ref 80.0–100.0)
Monocytes Absolute: 0.7 10*3/uL (ref 0.1–1.0)
Monocytes Relative: 9 %
Neutro Abs: 5.9 10*3/uL (ref 1.7–7.7)
Neutrophils Relative %: 77 %
Platelets: 378 10*3/uL (ref 150–400)
RBC: 3.25 MIL/uL — ABNORMAL LOW (ref 3.87–5.11)
RDW: 13.4 % (ref 11.5–15.5)
WBC: 7.8 10*3/uL (ref 4.0–10.5)
nRBC: 0 % (ref 0.0–0.2)

## 2019-10-20 LAB — GLUCOSE, CAPILLARY
Glucose-Capillary: 112 mg/dL — ABNORMAL HIGH (ref 70–99)
Glucose-Capillary: 114 mg/dL — ABNORMAL HIGH (ref 70–99)
Glucose-Capillary: 135 mg/dL — ABNORMAL HIGH (ref 70–99)
Glucose-Capillary: 145 mg/dL — ABNORMAL HIGH (ref 70–99)
Glucose-Capillary: 147 mg/dL — ABNORMAL HIGH (ref 70–99)
Glucose-Capillary: 160 mg/dL — ABNORMAL HIGH (ref 70–99)
Glucose-Capillary: 176 mg/dL — ABNORMAL HIGH (ref 70–99)

## 2019-10-20 LAB — COMPREHENSIVE METABOLIC PANEL WITH GFR
ALT: 23 U/L (ref 0–44)
AST: 41 U/L (ref 15–41)
Albumin: 2.6 g/dL — ABNORMAL LOW (ref 3.5–5.0)
Alkaline Phosphatase: 58 U/L (ref 38–126)
Anion gap: 10 (ref 5–15)
BUN: 17 mg/dL (ref 6–20)
CO2: 26 mmol/L (ref 22–32)
Calcium: 9.3 mg/dL (ref 8.9–10.3)
Chloride: 104 mmol/L (ref 98–111)
Creatinine, Ser: 0.68 mg/dL (ref 0.44–1.00)
GFR calc Af Amer: >60 mL/min (ref >60)
GFR calc non Af Amer: >60 mL/min (ref >60)
Glucose, Bld: 119 mg/dL — ABNORMAL HIGH (ref 70–99)
Potassium: 4.4 mmol/L (ref 3.5–5.1)
Sodium: 140 mmol/L (ref 135–145)
Total Bilirubin: 0.5 mg/dL (ref 0.3–1.2)
Total Protein: 6.1 g/dL — ABNORMAL LOW (ref 6.5–8.1)

## 2019-10-20 LAB — MAGNESIUM: Magnesium: 1.7 mg/dL (ref 1.7–2.4)

## 2019-10-20 MED ORDER — MAGNESIUM SULFATE 2 GM/50ML IV SOLN
2.0000 g | Freq: Once | INTRAVENOUS | Status: AC
  Administered 2019-10-20: 2 g via INTRAVENOUS
  Filled 2019-10-20: qty 50

## 2019-10-20 MED ORDER — ACETAMINOPHEN 500 MG PO TABS
500.0000 mg | ORAL_TABLET | Freq: Three times a day (TID) | ORAL | Status: DC
  Administered 2019-10-20 – 2019-11-13 (×64): 500 mg via ORAL
  Filled 2019-10-20 (×71): qty 1

## 2019-10-20 MED ORDER — MORPHINE SULFATE 10 MG/5ML PO SOLN: 5.0000 mg | ORAL | Status: DC | PRN

## 2019-10-20 MED ORDER — BOOST / RESOURCE BREEZE PO LIQD CUSTOM
1.0000 | Freq: Three times a day (TID) | ORAL | Status: DC
  Administered 2019-10-20 – 2019-10-21 (×4): 1 via ORAL

## 2019-10-20 MED ORDER — NAPROXEN 500 MG PO TABS
250.0000 mg | ORAL_TABLET | Freq: Three times a day (TID) | ORAL | Status: DC
  Administered 2019-10-20 – 2019-10-23 (×9): 250 mg via ORAL
  Filled 2019-10-20 (×13): qty 1

## 2019-10-20 MED ORDER — MORPHINE SULFATE (PF) 4 MG/ML IV SOLN
4.0000 mg | Freq: Once | INTRAVENOUS | Status: AC
  Administered 2019-10-20: 4 mg via INTRAVENOUS
  Filled 2019-10-20: qty 1

## 2019-10-20 MED ORDER — DOCUSATE SODIUM 100 MG PO CAPS
200.0000 mg | ORAL_CAPSULE | Freq: Every day | ORAL | Status: DC
  Administered 2019-10-20 – 2019-10-26 (×7): 200 mg via ORAL
  Filled 2019-10-20 (×7): qty 2

## 2019-10-20 MED ORDER — HYDROMORPHONE HCL 1 MG/ML IJ SOLN
0.5000 mg | Freq: Once | INTRAMUSCULAR | Status: AC
  Administered 2019-10-20: 0.5 mg via INTRAVENOUS
  Filled 2019-10-20: qty 0.5

## 2019-10-20 MED ORDER — POLYETHYLENE GLYCOL 3350 17 G PO PACK
17.0000 g | PACK | Freq: Every day | ORAL | Status: DC
  Administered 2019-10-20 – 2019-10-26 (×7): 17 g via ORAL
  Filled 2019-10-20 (×8): qty 1

## 2019-10-20 NOTE — Consult Note (Signed)
Consultation Note Date: 10/20/2019   Patient Name: Autumn Cook  DOB: 1960/07/23  MRN: [id number]  Age / Sex: 59 y.o., female  PCP: Copland, Gay Filler, MD Referring Physician: Yaakov Guthrie, MD  Reason for Consultation: Establishing goals of care  HPI/Patient Profile: 59 y.o. female  admitted on 10/16/2019    Clinical Assessment and Goals of Care: Autumn Cook is 59 years old.  She has a life limiting illness of metastatic breast cancer and is under the care of [name].  She has undergone chemotherapy as well as radiation therapy.  Imaging obtained in May 21 found pleural effusion as well as lesions on her liver.  She underwent Pleurx catheter placement with fluid analysis showing malignant cells on cytology review.  Patient has been admitted with sharp chest discomfort as well as shortness of breath.  She has been admitted to hospital medicine service and is being seen by pulmonary as well as medical oncology.  She has white out of left hemithorax.  She is on supplemental oxygen.  Palliative medicine consultation for optimizing pain management as well as providing additional support for pain and nonpain symptom management.  I saw Autumn Cook and her daughter Autumn Cook who was also at the bedside.  Introduced myself and palliative care as follows Palliative medicine is specialized medical care for people living with serious illness. It focuses on providing relief from the symptoms and stress of a serious illness. The goal is to improve quality of life for both the patient and the family.    [name] is awake alert resting in bed.  We discussed about her pain in detail.  She denies any chest discomfort is using supplemental oxygen but denies any significant shortness of breath.  She does have cough when she repositions in bed.  She denies hemoptysis.  She also has abdominal discomfort back discomfort and  essentially generalized pain.  NEXT OF KIN Patient has 2 sons and 2 daughters.  Patient's daughter Autumn Cook is here from [place] to be her primary caregiver.  Patient's other daughter Autumn Cook is also here from [place].  SUMMARY OF RECOMMENDATIONS   Pain management: Discussed with patient about current pain regimen.  Patient is on naproxen, morphine sulfate p.o. liquid, IV morphine as needed is also available.  She has been started on transdermal fentanyl 25 mcg patch.  Will monitor for use of p.o. and IV as needed medications to uptitrate transdermal fentanyl patch.  Patient believes the fentanyl patch is helping her manage her pain.  We discussed about rescue/breakthrough medications as well has long-acting pain management.  Nonpain symptoms such as nausea constipation also addressed.  Medication list reviewed.  Patient going for CT scan to evaluate extent of pleural effusion/questionable loculation.  PCCM also following. Full code/full scope care. Appreciate chaplain assistance for additional support, advanced directive paperwork. PMT to follow.   Code Status/Advance Care Planning:  Full code    Symptom Management:   As above  Palliative Prophylaxis:   Frequent Pain Assessment  Additional Recommendations (Limitations, Scope, Preferences):  Full Scope Treatment  Psycho-social/Spiritual:   Desire for further Chaplaincy support:yes  Additional Recommendations: Caregiving  Support/Resources  Prognosis:   Unable to determine  Discharge Planning: To Be Determined      Primary Diagnoses: Present on Admission: . Acute respiratory failure ([place]) . Hypertension . Malignant pleural effusion . Metastases to the liver ([place]) . Diabetes mellitus type 2, uncontrolled, with complications ([place]) . Hypercholesterolemia . Acute respiratory failure with hypoxia ([place])   I have reviewed the medical record, interviewed the patient and family, and examined the patient. The following  aspects are pertinent.  Past Medical History:  Diagnosis Date  . Allergy   . Arthritis   . Asthma   . Cancer [facility])    R breast cancer  . Diabetes mellitus without complication ([facility])    no meds now  . Family history of adverse reaction to anesthesia    "entire family takes a long time to wake up- N&V for several days"  . Family history of lung cancer   . Hypertension   . Migraine   . PONV (postoperative nausea and vomiting)    Social History   Socioeconomic History  . Marital status: Married    Spouse name: Not on file  . Number of children: Not on file  . Years of education: Not on file  . Highest education level: Not on file  Occupational History  . Occupation: real Geophysicist/field seismologist: [employer]  Tobacco Use  . Smoking status: Former Smoker    Packs/day: 1.00    Years: 5.00    Pack years: 5.00    Quit date: 05/08/1988    Years since quitting: 31.4  . Smokeless tobacco: Never Used  Vaping Use  . Vaping Use: Never used  Substance and Sexual Activity  . Alcohol use: Yes    Comment: social  . Drug use: No  . Sexual activity: Not on file  Other Topics Concern  . Not on file  Social History Narrative   married   Social Determinants of Health   Financial Resource Strain:   . Difficulty of Paying Living Expenses:   Food Insecurity:   . Worried About Charity fundraiser in the Last Year:   . Arboriculturist in the Last Year:   Transportation Needs:   . Film/video editor (Medical):   Marland Kitchen Lack of Transportation (Non-Medical):   Physical Activity:   . Days of Exercise per Week:   . Minutes of Exercise per Session:   Stress:   . Feeling of Stress :   Social Connections:   . Frequency of Communication with Friends and Family:   . Frequency of Social Gatherings with Friends and Family:   . Attends Religious Services:   . Active Member of Clubs or Organizations:   . Attends Archivist Meetings:   Marland Kitchen Marital Status:    Family History    Problem Relation Age of Onset  . Hypertension Father   . Cancer Father 29       lung cancer  . Stroke Father   . Heart disease Mother        arrhythmia  . Hypertension Mother   . Kidney failure Sister    Scheduled Meds: . acetaminophen  500 mg Oral TID WC  . atenolol  100 mg Oral Daily  . Chlorhexidine Gluconate Cloth  6 each Topical Daily  . docusate sodium  200 mg Oral Daily  . enoxaparin (LOVENOX) injection  40 mg Subcutaneous Q24H  . feeding supplement  1 Container Oral TID BM  . fentaNYL  1 patch Transdermal Q72H  . insulin aspart  0-15 Units Subcutaneous Q4H  . levalbuterol  1.25 mg Nebulization BID  . naproxen  250 mg Oral TID WC  . polyethylene glycol  17 g Oral Daily  . sodium chloride flush  10-40 mL Intracatheter Q12H  . sucralfate  1 g Oral TID WC & HS   Continuous Infusions: PRN Meds:.metoprolol tartrate, morphine, morphine injection, ondansetron (ZOFRAN) IV, sodium chloride flush Medications Prior to Admission:  Prior to Admission medications   Medication Sig Start Date End Date Taking? Authorizing Provider  acetaminophen (TYLENOL) 500 MG tablet Take 1 tablet (500 mg total) by mouth 3 (three) times daily. 10/14/19  Yes Arrien, Jimmy Picket, MD  albuterol (VENTOLIN HFA) 108 (90 Base) MCG/ACT inhaler Inhale 2 puffs into the lungs every 4 (four) hours as needed for wheezing or shortness of breath (cough, shortness of breath or wheezing.). 10/15/18  Yes Causey, Charlestine Massed, NP  atenolol (TENORMIN) 100 MG tablet TAKE 1 TABLET BY MOUTH EVERY DAY Patient taking differently: Take 100 mg by mouth at bedtime.  06/19/19  Yes Copland, Gay Filler, MD  fluticasone-salmeterol (ADVAIR HFA) 115-21 MCG/ACT inhaler Inhale 2 puffs into the lungs 2 (two) times daily. 10/02/19  Yes Tanner, Lyndon Code., PA-C  ibuprofen (ADVIL) 200 MG tablet Take 1 tablet (200 mg total) by mouth every 8 (eight) hours as needed for moderate pain. 10/14/19  Yes Arrien, Jimmy Picket, MD  KLOR-CON M20 20 MEQ  tablet Take 20 mEq by mouth at bedtime.  10/15/19  Yes [provider]  pantoprazole (PROTONIX) 40 MG tablet Take 1 tablet (40 mg total) by mouth daily. Patient taking differently: Take 40 mg by mouth at bedtime.  10/15/19 11/14/19 Yes Arrien, Jimmy Picket, MD  traMADol (ULTRAM) 50 MG tablet Take 1 tablet (50 mg total) by mouth every 6 (six) hours as needed for severe pain. 10/14/19  Yes Arrien, Jimmy Picket, MD  Vitamin D, Ergocalciferol, (DRISDOL) 1.25 MG (50000 UNIT) CAPS capsule TAKE 1 CAPSULE BY MOUTH ONE TIME PER WEEK Patient taking differently: Take 50,000 Units by mouth every Saturday.  09/30/19  Yes Copland, Gay Filler, MD  blood glucose meter kit and supplies KIT Dispense based on patient and insurance preference. Use up to four times daily as directed. (FOR ICD-9 250.00, 250.01). 03/07/16   Charlesetta Shanks, MD  capecitabine (XELODA) 500 MG tablet Take 3 tablets (1,500 mg total) by mouth 2 (two) times daily after a meal. Take daily for 14 days, then do not take for 7 days, then repeat. 10/14/19   Magrinat, Virgie Dad, MD  glucose blood (ACCU-CHEK ACTIVE STRIPS) test strip Use as instructed- check blood sugar up to 2x a day 08/30/18   Copland, Gay Filler, MD  lisinopril-hydrochlorothiazide (ZESTORETIC) 20-12.5 MG tablet TAKE 2 TABLETS BY MOUTH EVERY DAY 10/20/19   Copland, Gay Filler, MD   Allergies  Allergen Reactions  . Penicillins Hives    Did it involve swelling of the face/tongue/throat, SOB, or low BP? Yes--syncope episode Did it involve sudden or severe rash/hives, skin peeling, or any reaction on the inside of your mouth or nose? No Did you need to seek medical attention at a hospital or doctor's office? Yes When did it last happen? 37-38 YEARS AGO If all above answers are "NO", may proceed with cephalosporin use.   . Metformin And Related Diarrhea  . Adhesive [Tape] Other (See  Comments)    "severe bruising"  ALSO SKIN GLUE  . Asa [Aspirin] Other (See Comments)    GI  upset   . Mango Flavor Hives  . Olive Tree Hives    Pt tolerates olive oil   Review of Systems +abdominal pain  Physical Exam Awake alert sitting up in bed  Diminished breath sounds left side, has Pleurx catheter with clear dressing Regular work of breathing No edema No focal deficits Abdomen not distended  Vital Signs: BP 122/79 (BP Location: Left Arm)   Pulse 70   Temp 98.8 F (37.1 C) (Oral)   Resp 20   Ht _0  (1.676 m)   Wt 80.3 kg   LMP 11/14/2013   SpO2 98%   BMI 28.57 kg/m  Pain Scale: 0-10   Pain Score: 0-No pain   SpO2: SpO2: 98 % O2 Device:SpO2: 98 % O2 Flow Rate: .O2 Flow Rate (L/min): 2 L/min  IO: Intake/output summary:   Intake/Output Summary (Last 24 hours) at 10/20/2019 1441 Last data filed at 10/20/2019 1340 Gross per 24 hour  Intake 360 ml  Output 10 ml  Net 350 ml    LBM: Last BM Date: 10/19/19 Baseline Weight: Weight: 80.3 kg Most recent weight: Weight: 80.3 kg     Palliative Assessment/Data:   PPS 40%  Time In:  1330 Time Out:  1430 Time Total: 60  Greater than 50%  of this time was spent counseling and coordinating care related to the above assessment and plan.  Signed by: Loistine Chance, MD   Please contact Palliative Medicine Team phone at [phone] for questions and concerns.  For individual provider: See Shea Evans

## 2019-10-20 NOTE — Progress Notes (Signed)
I was referred by the [facility] about a call from the patient's family in Room 1412 for a Chaplain visit. The patient, Autumn Cook, was in the room with her daughter, [name]. I spoke with [facility] nurse, Vivien Rota, for patient details, information, or updates before I contacted the patient and her family. I visited the patient's room, introduced myself, and asked if I could assist in any way. They had additional questions about the Advanced Directive form. I reviewed the form with them and answered their questions. The form is ready to be notarized. I have contacted our staff notary to follow up with the patient.  I provided spiritual care through pastoral presence, by sharing words of encouragement, and by leading in prayer.    10/20/19 1300  Clinical Encounter Type  Visited With Patient and family together  Visit Type Follow-up;Spiritual support  Referral From Nurse  Consult/Referral To Chaplain  Spiritual Encounters  Spiritual Needs Prayer;Brochure    Chaplain Dr Redgie Grayer

## 2019-10-20 NOTE — Progress Notes (Addendum)
PROGRESS NOTE    Autumn Cook  FMB:[id number] DOB: [birth year] DOA: 10/16/2019 PCP: Darreld Mclean, MD  Brief Narrative:Patient is a 59 year old female with history of right breast cancer, hypertension, hyperlipidemia, type 2 diabetes mellitus, asthma who presents to the emergency department with complaints of central sharp chest pain as well as shortness of breath. She was recently admitted here and discharged 2 days prior to presentation when she underwent Pleurx catheter placement by PCCM after she was found to have large left pleural effusion secondary to her breast cancer. Patient did not know how to use the Pleurx catheter at home. On presentation, chest x-ray showed complete white out of the left lung but there was also suspicion for new consolidations on the right side concerning for pneumonia for which she was started on antibiotics.  Assessment & Plan:   Active Problems:   Hypertension   History of tobacco use   Hypercholesterolemia   Malignant neoplasm of overlapping sites of right breast in female, estrogen receptor positive ([facility])   Encounter for central line placement   Malignant pleural effusion   Pleural effusion   Metastases to the liver [facility])   Acute respiratory failure (HCC)   Diabetes mellitus type 2, uncontrolled, with complications (HCC)   Acute respiratory failure with hypoxia (HCC)   Pneumonia of right lower lobe due to infectious organism   Acute on chronic respiratory failure with hypoxia: Secondary to left-sided extensivemalignantpleural effusion. She is on 2 L of oxygen at home at baseline. She was recently admitted here and underwent left-sided Pleurx catheter but she did not know how to operate it at home. Currently her respiratory status is at baseline.  She denies having any dyspnea but concerned that the Pleurx is not draining.  CXR 10/17/2019 showedworsening opacification of the left chest with white out and increased mediastinal mass effect,  consistent with large volume pleural effusion. She is s/premoval of 650 mL of serosanguineous fluid through the Pleurx catheter on 10/16/2019. -Apparently when the family tried to use the Pleurx yesterday only got 30 cc and today only 10 cc. -Seen by oncology and the recommended to consult pulmonary.  Consulting pulmonary to evaluate.  Suspected HCAP:CXR onpresentation showed possible consolidations on the right side.She is not febrile or septic. She wasstarted on cefepime by admitting physician.Chest x-ray done yesterday showed the right lung was clear.  Antibiotics have been stopped as there was very low suspicion for pneumonia.  Procalcitonin also was negative.  Malignantneoplasm of the right breast: [name] is following. Notstarted on chemotherapy yet.  Hypertension: Blood pressure stable at this time.  Continue atenolol.  Continue to monitor and adjust medications as needed.  Diabetes mellitus: A1c of 7.8. Continue sliding scaleinsulin for now.She is not on any medications at home. Consider Metformin 500 mg twice a day on discharge.  Hypomagnesemia:  Magnesium level today 1.7 again.  Will order replacement.  Continue to monitor and replace as needed.  Potassium stable.  Goals of care:Palliative care was consulted on her last admission. Remains full code.  Pain control- Family is concerned that patient is always in 10 out of 10 pain.  Patient unable to tolerate Norco or OxyContin.  Seen by oncology and they started her on naproxen, liquid morphine.  Also on fentanyl patch. Continue bowel regimen.  Estimated body mass index is 28.57 kg/m as calculated from the following:   Height as of this encounter: 5\' 6"  (1.676 m).   Weight as of this encounter: 80.3 kg.   DVT  prophylaxis:Lovenox Code Status:Full Family Communication: Discussed with daughter at bedside. Status is: Inpatient  Remains inpatient appropriate because:Unsafe d/c plan   Dispo: The  patient is from:Home Anticipated d/c is CX:KGYJ Anticipated d/c date is: 2 day Patient currently is not medically stable to d/c.  Patient did not feel ready to go home today.  Pain control remains an issue  Consultants:PCCM  Procedures:None  Antimicrobials: None  Subjective: She is complaining of abdominal pain.  Denies having any nausea, vomiting or diarrhea.  On bowel regimen to prevent constipation.  Denies any shortness of breath or dyspnea on exertion at this time.  Per daughter they were unable to drain from her Pleurx catheter. Objective: Vitals:   10/19/19 2035 10/19/19 2058 10/20/19 0400 10/20/19 0838  BP:  128/77 120/87   Pulse:  97 93   Resp:  18 20   Temp:  98.6 F (37 C) 98.2 F (36.8 C)   TempSrc:  Oral Oral   SpO2: 98% 99% 96% 98%  Weight:      Height:        Intake/Output Summary (Last 24 hours) at 10/20/2019 1040 Last data filed at 10/19/2019 1600 Gross per 24 hour  Intake 360 ml  Output --  Net 360 ml   Filed Weights   10/16/19 0431  Weight: 80.3 kg    Examination: General exam: Appears calm and comfortable  Respiratory system: Clear to auscultation on the right Respiratory effort normal.  Drain in the left chest noted.  Decreased breath sounds on the left. Cardiovascular system: S1 & S2 heard, RRR. No murmur. No pedal edema. Gastrointestinal system: Abdomen is nondistended, soft, nonspecific tenderness without any guarding or rebound. No masses felt. Normal bowel sounds heard. Central nervous system: Alert and oriented. No focal neurological deficits. Extremities: Symmetric 5 x 5 power. Skin: No rashes, lesions or ulcers Psychiatry: Judgement and insight appear normal. Mood & affect appropriate.     Data Reviewed: I have personally reviewed following labs and imaging studies  CBC: Recent Labs  Lab 10/16/19 0043 10/16/19 0043 10/16/19 1608 10/17/19 0519 10/18/19 0338 10/19/19 0352  10/20/19 0405  WBC 7.6   < > 6.9 6.9 7.4 8.2 7.8  NEUTROABS 5.9  --   --  5.1 5.9 5.9 5.9  HGB 10.7*   < > 10.2* 10.4* 9.5* 9.6* 9.4*  HCT 32.8*   < > 30.9* 31.2* 29.7* 29.5* 29.6*  MCV 89.1   < > 89.6 89.7 89.5 89.9 91.1  PLT 375   < > 363 377 389 384 378   < > = values in this interval not displayed.   Basic Metabolic Panel: Recent Labs  Lab 10/16/19 0043 10/16/19 0043 10/16/19 1608 10/17/19 0519 10/18/19 0338 10/19/19 0352 10/20/19 0405  NA 136  --   --  137 139 139 140  K 4.5  --   --  3.9 4.1 4.9 4.4  CL 102  --   --  102 104 104 104  CO2 24  --   --  21* 24 27 26   GLUCOSE 200*  --   --  166* 144* 141* 119*  BUN 17  --   --  17 17 17 17   CREATININE 0.78   < > 0.62 0.67 0.62 0.78 0.68  CALCIUM 9.0  --   --  9.0 8.9 9.2 9.3  MG  --   --   --  1.6* 1.9 1.8 1.7  PHOS  --   --   --  3.0  --   --   --    < > =  values in this interval not displayed.   GFR: Estimated Creatinine Clearance: 82.9 mL/min (by C-G formula based on SCr of 0.68 mg/dL). Liver Function Tests: Recent Labs  Lab 10/16/19 1608 10/17/19 0519 10/18/19 0338 10/19/19 0352 10/20/19 0405  AST  --  27 32 32 41  ALT  --  16 22 20 23   ALKPHOS  --  49 51 56 58  BILITOT  --  0.4 0.3 0.5 0.5  PROT 5.9* 5.9* 5.7* 6.2* 6.1*  ALBUMIN 2.6* 2.6* 2.4* 2.6* 2.6*   No results for input(s): LIPASE, AMYLASE in the last 168 hours. No results for input(s): AMMONIA in the last 168 hours. Coagulation Profile: No results for input(s): INR, PROTIME in the last 168 hours. Cardiac Enzymes: No results for input(s): CKTOTAL, CKMB, CKMBINDEX, TROPONINI in the last 168 hours. BNP (last 3 results) No results for input(s): PROBNP in the last 8760 hours. HbA1C: No results for input(s): HGBA1C in the last 72 hours. CBG: Recent Labs  Lab 10/19/19 1704 10/19/19 2058 10/20/19 0002 10/20/19 0400 10/20/19 0752  GLUCAP 170* 121* 135* 114* 147*   Lipid Profile: No results for input(s): CHOL, HDL, LDLCALC, TRIG, CHOLHDL,  LDLDIRECT in the last 72 hours. Thyroid Function Tests: No results for input(s): TSH, T4TOTAL, FREET4, T3FREE, THYROIDAB in the last 72 hours. Anemia Panel: No results for input(s): VITAMINB12, FOLATE, FERRITIN, TIBC, IRON, RETICCTPCT in the last 72 hours. Sepsis Labs: Recent Labs  Lab 10/16/19 1608 10/17/19 0519  PROCALCITON <0.10 <0.10  LATICACIDVEN 1.7  --     Recent Results (from the past 240 hour(s))  MRSA PCR Screening     Status: None   Collection Time: 10/13/19  9:37 AM   Specimen: Nasal Mucosa; Nasopharyngeal  Result Value Ref Range Status   MRSA by PCR NEGATIVE NEGATIVE Final    Comment:        The GeneXpert MRSA Assay (FDA approved for NASAL specimens only), is one component of a comprehensive MRSA colonization surveillance program. It is not intended to diagnose MRSA infection nor to guide or monitor treatment for MRSA infections. Performed at [facility], [place] [address] [place], [place] [address]   SARS Coronavirus 2 by RT PCR (hospital order, performed in [facility] hospital lab) Nasopharyngeal Nasopharyngeal Swab     Status: None   Collection Time: 10/16/19 12:43 AM   Specimen: Nasopharyngeal Swab  Result Value Ref Range Status   SARS Coronavirus 2 NEGATIVE NEGATIVE Final    Comment: (NOTE) SARS-CoV-2 target nucleic acids are NOT DETECTED.  The SARS-CoV-2 RNA is generally detectable in upper and lower respiratory specimens during the acute phase of infection. The lowest concentration of SARS-CoV-2 viral copies this assay can detect is 250 copies / mL. A negative result does not preclude SARS-CoV-2 infection and should not be used as the sole basis for treatment or other patient management decisions.  A negative result may occur with improper specimen collection / handling, submission of specimen other than nasopharyngeal swab, presence of viral mutation(s) within the areas targeted by this assay, and inadequate number of viral  copies (<250 copies / mL). A negative result must be combined with clinical observations, patient history, and epidemiological information.  Fact Sheet for Patients:   StrictlyIdeas.no  Fact Sheet for Healthcare Providers: BankingDealers.co.za  This test is not yet approved or  cleared by the [place] FDA and has been authorized for detection and/or diagnosis of SARS-CoV-2 by FDA under an Emergency Use Authorization (EUA).  This EUA will  remain in effect (meaning this test can be used) for the duration of the COVID-19 declaration under Section 564(b)(1) of the Act, 21 U.S.C. section 360bbb-3(b)(1), unless the authorization is terminated or revoked sooner.  Performed at [facility], [address] [place], [place] [address]   Blood culture (routine x 2)     Status: None (Preliminary result)   Collection Time: 10/16/19  5:20 AM   Specimen: BLOOD LEFT ARM  Result Value Ref Range Status   Specimen Description   Final    BLOOD LEFT ARM Performed at [facility] [address] [place], [place] [address]    Special Requests   Final    BOTTLES DRAWN AEROBIC AND ANAEROBIC Blood Culture results may not be optimal due to an inadequate volume of blood received in culture bottles Performed at [facility] [facility] [address], [place], [place] [address]    Culture   Final    NO GROWTH 4 DAYS Performed at [facility], [place] [address] [place], [place] [address]    Report Status PENDING  Incomplete  Body fluid culture (includes gram stain)     Status: None   Collection Time: 10/16/19  7:48 AM   Specimen: Pleural Fluid  Result Value Ref Range Status   Specimen Description   Final    PLEURAL Performed at [facility] [address] [place], [place] [address]    Special Requests   Final    NONE Performed at [facility], [place] [address]  [place], [place] [address]    Gram Stain   Final    RARE WBC PRESENT, PREDOMINANTLY MONONUCLEAR NO ORGANISMS SEEN    Culture   Final    NO GROWTH 3 DAYS Performed at [facility], [place] [address], [place], [place] [address]    Report Status 10/19/2019 FINAL  Final  Fungus Culture With Stain     Status: None (Preliminary result)   Collection Time: 10/16/19  7:48 AM   Specimen: Pleural Fluid  Result Value Ref Range Status   Fungus Stain Final report  Final    Comment: (NOTE) Performed At: [facility] [address], [place] [id number] Rush Farmer MD [id number]    Fungus (Mycology) Culture PENDING  Incomplete   Fungal Source PLEURAL  Final    Comment: Performed at [facility], [place] [address], [place], [place] [address]  Fungus Culture Result     Status: None   Collection Time: 10/16/19  7:48 AM  Result Value Ref Range Status   Result 1 Comment  Final    Comment: (NOTE) KOH/Calcofluor preparation:  no fungus observed. Performed At: [facility] [place], [place] [id number] Rush Farmer MD [id number]   Blood culture (routine x 2)     Status: None (Preliminary result)   Collection Time: 10/16/19  9:42 PM   Specimen: BLOOD  Result Value Ref Range Status   Specimen Description   Final    BLOOD LEFT HAND Performed at [facility], [place] [address] [place], [place] [address]    Special Requests   Final    BOTTLES DRAWN AEROBIC ONLY Blood Culture adequate volume Performed at [facility] [address], [place], [place] [address]    Culture   Final    NO GROWTH 3 DAYS Performed at [facility], [place] [address], [place], [place] [address]  Report Status PENDING  Incomplete         Radiology Studies: DG Chest 1 View  Result Date: 10/19/2019 CLINICAL DATA:  Pleural effusion EXAM: CHEST  1 VIEW COMPARISON:  10/17/2019 FINDINGS: Unchanged position of left IJ  approach central venous catheter with tip in the lower SVC. Mild cardiomegaly. The left hemithorax remains completely opacified. IMPRESSION: Unchanged complete opacification of the left hemithorax. Electronically Signed   By: Ulyses Jarred M.D.   On: 10/19/2019 04:42        Scheduled Meds: . acetaminophen  500 mg Oral TID WC  . atenolol  100 mg Oral Daily  . Chlorhexidine Gluconate Cloth  6 each Topical Daily  . docusate sodium  200 mg Oral Daily  . enoxaparin (LOVENOX) injection  40 mg Subcutaneous Q24H  . fentaNYL  1 patch Transdermal Q72H  . insulin aspart  0-15 Units Subcutaneous Q4H  . levalbuterol  1.25 mg Nebulization BID  . naproxen  250 mg Oral TID WC  . polyethylene glycol  17 g Oral Daily  . sodium chloride flush  10-40 mL Intracatheter Q12H  . sucralfate  1 g Oral TID WC & HS   Continuous Infusions:   LOS: 4 days     Yaakov Guthrie, MD  10/20/2019, 10:40 AM

## 2019-10-20 NOTE — Consult Note (Signed)
NAME:  Autumn Cook, MRN:  [id number], DOB:  1960/10/07, LOS: 4 ADMISSION DATE:  10/16/2019, CONSULTATION DATE:  10/20/2019 REFERRING MD:  Dr. Barth Kirks, CHIEF COMPLAINT:  Pleural effusion   Brief History   59 yo female former smoker with metastatic breast cancer complicated by malignant pleural effusion.  She had pleurx catheter placed on 10/14/19, but continues to have white out of Lt hemithorax on chest xray.  PCCM asked to assess.  History of present illness   59 yo female diagnosed with multicentric invasive ductal carcinoma after having abnormal mammogram on 07/01/18.  She was found to have Rt axillary nodal involvement.  She had neoadjuvant chemotherapy with doxorubicin and cyclophosphamide from 08/07/18 to 09/06/18.  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  She had adjuvant radiation therapy to Rt breast and axilla from 1013/20 to 04/02/19.  She was started on anastrozole on 05/14/18.  She was seen by oncology on 09/30/19 and noted to have Lt flank pain, dry cough, fatigue, and chest tightness.  She was treated for possible respiratory infection and asthma exacerbation.  She had renal stone CT study.  Found to have large Lt pleural effusion and 2 hypoattenuating lesions on her live.  She had Lt thoracentesis by IR on 10/03/19 with 950 ml fluid removed.  Fluid analysis showed malignant cells on cytology review.  Her breathing continued to get worse.  She went to hospital on 10/09/19.  She had pleurx catheter placed on 10/13/19.  She was noted to have 1800 ml of fluid removed.  However, CXR didn't show significant change.  She was discharged home on 10/14/19.    She returned to hospital on 10/16/19.  She was still feeling short of breath and had sharp chest pain.  CXR showed white out of Lt hemithorax.  Staff unable to drain fluid from pleurx catheter.  She wasn't able to get supplies at home to drain catheter before she had to come back to  ER.  Pain symptoms better since her pain medications adjusted.  Still short of breath with activity and now needing supplemental oxygen.  Not having sputum, fever, or hemoptysis.  Past Medical History  Allergies, Asthma, DM, HTN, Migraine headache  Significant Hospital Events   6/10 Admit  Consults:  Oncology  Procedures:  Lt IJ CVL 6/10 >>   Significant Diagnostic Tests:  Lt thoracentesis 10/03/19 >> protein 5.4, LDH 1473, WBC 3118 (46% lymphocytes, 34% macrophages, 20% neutrophils), cytology positive for malignant cells from breast primary  Micro Data:  Lt pleural fluid 6/10 >>  Blood 6/10 >>   Antimicrobials:  Vancomycin 6/09 Cefepime 6/09 >> 6/11  Interim history/subjective:    Objective   Blood pressure 122/79, pulse 70, temperature 98.8 F (37.1 C), temperature source Oral, resp. rate 20, height 5' 6"  (1.676 m), weight 80.3 kg, last menstrual period 11/14/2013, SpO2 98 %.        Intake/Output Summary (Last 24 hours) at 10/20/2019 1402 Last data filed at 10/20/2019 0830 Gross per 24 hour  Intake 300 ml  Output --  Net 300 ml   Filed Weights   10/16/19 0431  Weight: 80.3 kg    Examination:  General - alert Eyes - pupils reactive ENT - no sinus tenderness, no stridor Cardiac - regular rate/rhythm, no murmur Chest - decreased BS on Lt with bronchial breath sounds in Lt upper lung fields Abdomen - soft, non tender, + bowel sounds Extremities - no cyanosis, clubbing,  or edema Skin - no rashes Neuro - normal strength, moves extremities, follows commands Psych - normal mood and behavior  Assessment & Plan:   White out of Lt hemithorax with recent diagnosis of Lt malignant pleural effusion 2nd to breast cancer based on cytology results from 10/03/19. - she has pleurx catheter in place, but unclear if issue is non functioning catheter versus alternative process impacting Lt hemithorax - will get CT chest w/o contrast to further assess Lt hemithorax, and then  determine next steps  Asthma. - continue xopenex  Hypoxia. - goal SpO2 > 92%  Labs   CBC: Recent Labs  Lab 10/16/19 0043 10/16/19 0043 10/16/19 1608 10/17/19 0519 10/18/19 0338 10/19/19 0352 10/20/19 0405  WBC 7.6   < > 6.9 6.9 7.4 8.2 7.8  NEUTROABS 5.9  --   --  5.1 5.9 5.9 5.9  HGB 10.7*   < > 10.2* 10.4* 9.5* 9.6* 9.4*  HCT 32.8*   < > 30.9* 31.2* 29.7* 29.5* 29.6*  MCV 89.1   < > 89.6 89.7 89.5 89.9 91.1  PLT 375   < > 363 377 389 384 378   < > = values in this interval not displayed.    Basic Metabolic Panel: Recent Labs  Lab 10/16/19 0043 10/16/19 0043 10/16/19 1608 10/17/19 0519 10/18/19 0338 10/19/19 0352 10/20/19 0405  NA 136  --   --  137 139 139 140  K 4.5  --   --  3.9 4.1 4.9 4.4  CL 102  --   --  102 104 104 104  CO2 24  --   --  21* 24 27 26   GLUCOSE 200*  --   --  166* 144* 141* 119*  BUN 17  --   --  17 17 17 17   CREATININE 0.78   < > 0.62 0.67 0.62 0.78 0.68  CALCIUM 9.0  --   --  9.0 8.9 9.2 9.3  MG  --   --   --  1.6* 1.9 1.8 1.7  PHOS  --   --   --  3.0  --   --   --    < > = values in this interval not displayed.   GFR: Estimated Creatinine Clearance: 82.9 mL/min (by C-G formula based on SCr of 0.68 mg/dL). Recent Labs  Lab 10/16/19 1608 10/16/19 1608 10/17/19 0519 10/18/19 0338 10/19/19 0352 10/20/19 0405  PROCALCITON <0.10  --  <0.10  --   --   --   WBC 6.9   < > 6.9 7.4 8.2 7.8  LATICACIDVEN 1.7  --   --   --   --   --    < > = values in this interval not displayed.    Liver Function Tests: Recent Labs  Lab 10/16/19 1608 10/17/19 0519 10/18/19 0338 10/19/19 0352 10/20/19 0405  AST  --  27 32 32 41  ALT  --  16 22 20 23   ALKPHOS  --  49 51 56 58  BILITOT  --  0.4 0.3 0.5 0.5  PROT 5.9* 5.9* 5.7* 6.2* 6.1*  ALBUMIN 2.6* 2.6* 2.4* 2.6* 2.6*   No results for input(s): LIPASE, AMYLASE in the last 168 hours. No results for input(s): AMMONIA in the last 168 hours.  ABG    Component Value Date/Time   HCO3 29.1  (H) 03/07/2016 1420   TCO2 31 03/07/2016 1420   O2SAT 44.0 03/07/2016 1420     Coagulation Profile: No results for  input(s): INR, PROTIME in the last 168 hours.  Cardiac Enzymes: No results for input(s): CKTOTAL, CKMB, CKMBINDEX, TROPONINI in the last 168 hours.  HbA1C: Hgb A1c MFr Bld  Date/Time Value Ref Range Status  10/11/2019 04:54 AM 7.8 (H) 4.8 - 5.6 % Final    Comment:    (NOTE) Pre diabetes:          5.7%-6.4% Diabetes:              >6.4% Glycemic control for   <7.0% adults with diabetes   12/02/2018 10:42 AM 5.7 4.6 - 6.5 % Final    Comment:    Glycemic Control Guidelines for People with Diabetes:Non Diabetic:  <6%Goal of Therapy: <7%Additional Action Suggested:  >8%     CBG: Recent Labs  Lab 10/19/19 2058 10/20/19 0002 10/20/19 0400 10/20/19 0752 10/20/19 1219  GLUCAP 121* 135* 114* 147* 160*    Review of Systems:   Reviewed and negative  Past Medical History  She,  has a past medical history of Allergy, Arthritis, Asthma, Cancer ([place]), Diabetes mellitus without complication ([place]), Family history of adverse reaction to anesthesia, Family history of lung cancer, Hypertension, Migraine, and PONV (postoperative nausea and vomiting).   Surgical History    Past Surgical History:  Procedure Laterality Date  . BREAST CYST EXCISION  1994  . BREAST LUMPECTOMY WITH RADIOACTIVE SEED AND SENTINEL LYMPH NODE BIOPSY Right 12/27/2018   Procedure: RIGHT BREAST LUMPECTOMY WITH RADIOACTIVE SEED X2 AND RIGHT SEED TARGETED AXILLARY SENTINEL LYMPH NODE BIOPSY;  Surgeon: Alphonsa Overall, MD;  Location: [facility];  Service: General;  Laterality: Right;  . DILATION AND CURETTAGE OF UTERUS    . PORTACATH PLACEMENT Right 08/01/2018   Procedure: INSERTION PORT-A-CATH WITH ULTRASOUND;  Surgeon: Alphonsa Overall, MD;  Location: [facility];  Service: General;  Laterality: Right;     Social History   reports that she quit smoking about 31 years ago. She  has a 5.00 pack-year smoking history. She has never used smokeless tobacco. She reports current alcohol use. She reports that she does not use drugs.   Family History   Her family history includes Cancer (age of onset: 52) in her father; Heart disease in her mother; Hypertension in her father and mother; Kidney failure in her sister; Stroke in her father.   Allergies Allergies  Allergen Reactions  . Penicillins Hives    Did it involve swelling of the face/tongue/throat, SOB, or low BP? Yes--syncope episode Did it involve sudden or severe rash/hives, skin peeling, or any reaction on the inside of your mouth or nose? No Did you need to seek medical attention at a hospital or doctor's office? Yes When did it last happen? 37-38 YEARS AGO If all above answers are "NO", may proceed with cephalosporin use.   . Metformin And Related Diarrhea  . Adhesive [Tape] Other (See Comments)    "severe bruising"  ALSO SKIN GLUE  . Asa [Aspirin] Other (See Comments)    GI upset   . Mango Flavor Hives  . Olive Tree Hives    Pt tolerates olive oil     Home Medications  Prior to Admission medications   Medication Sig Start Date End Date Taking? Authorizing Provider  acetaminophen (TYLENOL) 500 MG tablet Take 1 tablet (500 mg total) by mouth 3 (three) times daily. 10/14/19  Yes Arrien, Jimmy Picket, MD  albuterol (VENTOLIN HFA) 108 (90 Base) MCG/ACT inhaler Inhale 2 puffs into the lungs every 4 (four) hours as needed  for wheezing or shortness of breath (cough, shortness of breath or wheezing.). 10/15/18  Yes Causey, Charlestine Massed, NP  atenolol (TENORMIN) 100 MG tablet TAKE 1 TABLET BY MOUTH EVERY DAY Patient taking differently: Take 100 mg by mouth at bedtime.  06/19/19  Yes Copland, Gay Filler, MD  fluticasone-salmeterol (ADVAIR HFA) 115-21 MCG/ACT inhaler Inhale 2 puffs into the lungs 2 (two) times daily. 10/02/19  Yes Tanner, Lyndon Code., PA-C  ibuprofen (ADVIL) 200 MG tablet Take 1 tablet (200 mg  total) by mouth every 8 (eight) hours as needed for moderate pain. 10/14/19  Yes Arrien, Jimmy Picket, MD  KLOR-CON M20 20 MEQ tablet Take 20 mEq by mouth at bedtime.  10/15/19  Yes [provider]  pantoprazole (PROTONIX) 40 MG tablet Take 1 tablet (40 mg total) by mouth daily. Patient taking differently: Take 40 mg by mouth at bedtime.  10/15/19 11/14/19 Yes Arrien, Jimmy Picket, MD  traMADol (ULTRAM) 50 MG tablet Take 1 tablet (50 mg total) by mouth every 6 (six) hours as needed for severe pain. 10/14/19  Yes Arrien, Jimmy Picket, MD  Vitamin D, Ergocalciferol, (DRISDOL) 1.25 MG (50000 UNIT) CAPS capsule TAKE 1 CAPSULE BY MOUTH ONE TIME PER WEEK Patient taking differently: Take 50,000 Units by mouth every Saturday.  09/30/19  Yes Copland, Gay Filler, MD  blood glucose meter kit and supplies KIT Dispense based on patient and insurance preference. Use up to four times daily as directed. (FOR ICD-9 250.00, 250.01). 03/07/16   Charlesetta Shanks, MD  capecitabine (XELODA) 500 MG tablet Take 3 tablets (1,500 mg total) by mouth 2 (two) times daily after a meal. Take daily for 14 days, then do not take for 7 days, then repeat. 10/14/19   Magrinat, Virgie Dad, MD  glucose blood (ACCU-CHEK ACTIVE STRIPS) test strip Use as instructed- check blood sugar up to 2x a day 08/30/18   Copland, Gay Filler, MD  lisinopril-hydrochlorothiazide (ZESTORETIC) 20-12.5 MG tablet TAKE 2 TABLETS BY MOUTH EVERY DAY 10/20/19   Copland, Gay Filler, MD     Signature:  Chesley Mires, MD [place] Pager - [phone] 10/20/2019, 2:10 PM

## 2019-10-20 NOTE — Progress Notes (Signed)
Seen for nonfunctioning pleurX. In sterile fashion, dressing taken down, sutures removed. PleurX catheter connected to luerlock with 3-way stopcock. 60 cc flushing did not return much fluid. Left pleurX connected to atrium to waterseal (patient with pain with any vacuum) Examined chest with [place]: fluid is remaining but appears complex. CT reviewed. Chest tube is in pleural space. She has no acute respiratory symptoms. On drainage she gets breathing relief then sharp pain c/w entrapment. Would advise following: - tpa/dornase though chest tube tomorrow am, should only need 1-2 doses - if no drainage with this, can try to tap more apically.  If fluid does not drain more apically with this, there really is nothing left to offer. - Discussed with Dr. Marlene Bast MD PCCM

## 2019-10-20 NOTE — Progress Notes (Signed)
Autumn Cook   DOB:08-Feb-1961   HK#:[id number]   VFI#:[id number]  Subjective:  Patient is alert, tells me she gets OOB to BR sometimes by herself, alerted re falls and encouraged to wait for staff to assist; BM 2 days ago, not hard; thinks carafate is too sweet; when she takes norco it "tears up" her stomach, meaning lower abdomen; BM 2 days ago, not hard; tells me when they tried to use pleux yesterday they only got 30 cc and today 10 cc. Daughter in room   Objective: African American woman examined in bed Vitals:   10/19/19 2058 10/20/19 0400  BP: 128/77 120/87  Pulse: 97 93  Resp: 18 20  Temp: 98.6 F (37 C) 98.2 F (36.8 C)  SpO2: 99% 96%    Body mass index is 28.57 kg/m.  Intake/Output Summary (Last 24 hours) at 10/20/2019 0830 Last data filed at 10/19/2019 1600 Gross per 24 hour  Intake 360 ml  Output --  Net 360 ml      Lungs no rales or wheezes--auscultated anterolaterally  Heart regular rate and rhythm  Abdomen soft, +BS  Neuro nonfocal  Breast exam: deferred  CBG (last 3)  Recent Labs    10/20/19 0002 10/20/19 0400 10/20/19 0752  GLUCAP 135* 114* 147*     Labs:  Lab Results  Component Value Date   WBC 7.8 10/20/2019   HGB 9.4 (L) 10/20/2019   HCT 29.6 (L) 10/20/2019   MCV 91.1 10/20/2019   PLT 378 10/20/2019   NEUTROABS 5.9 10/20/2019    _0 @  Urine Studies No results for input(s): UHGB, CRYS in the last 72 hours.  Invalid input(s): UACOL, UAPR, USPG, UPH, UTP, UGL, UKET, UBIL, UNIT, UROB, ULEU, UEPI, UWBC, URBC, UBAC, [place], [place], [place]  Basic Metabolic Panel: Recent Labs  Lab 10/16/19 0043 10/16/19 0043 10/16/19 1608 10/17/19 0519 10/17/19 0519 10/18/19 0338 10/18/19 0338 10/19/19 0352 10/20/19 0405  NA 136  --   --  137  --  139  --  139 140  K 4.5   < >  --  3.9   < > 4.1   < > 4.9 4.4  CL 102  --   --  102  --  104  --  104 104  CO2 24  --   --  21*  --  24  --  27 26  GLUCOSE 200*  --   --  166*  --  144*  --  141*  119*  BUN 17  --   --  17  --  17  --  17 17  CREATININE 0.78   < > 0.62 0.67  --  0.62  --  0.78 0.68  CALCIUM 9.0  --   --  9.0  --  8.9  --  9.2 9.3  MG  --   --   --  1.6*  --  1.9  --  1.8 1.7  PHOS  --   --   --  3.0  --   --   --   --   --    < > = values in this interval not displayed.   GFR Estimated Creatinine Clearance: 82.9 mL/min (by C-G formula based on SCr of 0.68 mg/dL). Liver Function Tests: Recent Labs  Lab 10/16/19 1608 10/17/19 0519 10/18/19 0338 10/19/19 0352 10/20/19 0405  AST  --  27 32 32 41  ALT  --  _1 ALKPHOS  --  49  51 56 58  BILITOT  --  0.4 0.3 0.5 0.5  PROT 5.9* 5.9* 5.7* 6.2* 6.1*  ALBUMIN 2.6* 2.6* 2.4* 2.6* 2.6*   No results for input(s): LIPASE, AMYLASE in the last 168 hours. No results for input(s): AMMONIA in the last 168 hours. Coagulation profile No results for input(s): INR, PROTIME in the last 168 hours.  CBC: Recent Labs  Lab 10/16/19 0043 10/16/19 0043 10/16/19 1608 10/17/19 0519 10/18/19 0338 10/19/19 0352 10/20/19 0405  WBC 7.6   < > 6.9 6.9 7.4 8.2 7.8  NEUTROABS 5.9  --   --  5.1 5.9 5.9 5.9  HGB 10.7*   < > 10.2* 10.4* 9.5* 9.6* 9.4*  HCT 32.8*   < > 30.9* 31.2* 29.7* 29.5* 29.6*  MCV 89.1   < > 89.6 89.7 89.5 89.9 91.1  PLT 375   < > 363 377 389 384 378   < > = values in this interval not displayed.   Cardiac Enzymes: No results for input(s): CKTOTAL, CKMB, CKMBINDEX, TROPONINI in the last 168 hours. BNP: Invalid input(s): POCBNP CBG: Recent Labs  Lab 10/19/19 1704 10/19/19 2058 10/20/19 0002 10/20/19 0400 10/20/19 0752  GLUCAP 170* 121* 135* 114* 147*   D-Dimer No results for input(s): DDIMER in the last 72 hours. Hgb A1c No results for input(s): HGBA1C in the last 72 hours. Lipid Profile No results for input(s): CHOL, HDL, LDLCALC, TRIG, CHOLHDL, LDLDIRECT in the last 72 hours. Thyroid function studies No results for input(s): TSH, T4TOTAL, T3FREE, THYROIDAB in the last 72  hours.  Invalid input(s): FREET3 Anemia work up No results for input(s): VITAMINB12, FOLATE, FERRITIN, TIBC, IRON, RETICCTPCT in the last 72 hours. Microbiology Recent Results (from the past 240 hour(s))  MRSA PCR Screening     Status: None   Collection Time: 10/13/19  9:37 AM   Specimen: Nasal Mucosa; Nasopharyngeal  Result Value Ref Range Status   MRSA by PCR NEGATIVE NEGATIVE Final    Comment:        The GeneXpert MRSA Assay (FDA approved for NASAL specimens only), is one component of a comprehensive MRSA colonization surveillance program. It is not intended to diagnose MRSA infection nor to guide or monitor treatment for MRSA infections. Performed at [facility], [place] [address] [place], [place] [address]   SARS Coronavirus 2 by RT PCR (hospital order, performed in [facility] hospital lab) Nasopharyngeal Nasopharyngeal Swab     Status: None   Collection Time: 10/16/19 12:43 AM   Specimen: Nasopharyngeal Swab  Result Value Ref Range Status   SARS Coronavirus 2 NEGATIVE NEGATIVE Final    Comment: (NOTE) SARS-CoV-2 target nucleic acids are NOT DETECTED.  The SARS-CoV-2 RNA is generally detectable in upper and lower respiratory specimens during the acute phase of infection. The lowest concentration of SARS-CoV-2 viral copies this assay can detect is 250 copies / mL. A negative result does not preclude SARS-CoV-2 infection and should not be used as the sole basis for treatment or other patient management decisions.  A negative result may occur with improper specimen collection / handling, submission of specimen other than nasopharyngeal swab, presence of viral mutation(s) within the areas targeted by this assay, and inadequate number of viral copies (<250 copies / mL). A negative result must be combined with clinical observations, patient history, and epidemiological information.  Fact Sheet for Patients:    StrictlyIdeas.no  Fact Sheet for Healthcare Providers: BankingDealers.co.za  This test is not yet approved or  cleared by  the [employer] and has been authorized for detection and/or diagnosis of SARS-CoV-2 by FDA under an Emergency Use Authorization (EUA).  This EUA will remain in effect (meaning this test can be used) for the duration of the COVID-19 declaration under Section 564(b)(1) of the Act, 21 U.S.C. section 360bbb-3(b)(1), unless the authorization is terminated or revoked sooner.  Performed at [facility], [address] [place], [place] [address]   Blood culture (routine x 2)     Status: None (Preliminary result)   Collection Time: 10/16/19  5:20 AM   Specimen: BLOOD LEFT ARM  Result Value Ref Range Status   Specimen Description   Final    BLOOD LEFT ARM Performed at [place] [address] [place], [place] [address]    Special Requests   Final    BOTTLES DRAWN AEROBIC AND ANAEROBIC Blood Culture results may not be optimal due to an inadequate volume of blood received in culture bottles Performed at [place] [address], [place], [place] [address]    Culture   Final    NO GROWTH 3 DAYS Performed at [facility], [place] [address], [place], [place] [address]    Report Status PENDING  Incomplete  Body fluid culture (includes gram stain)     Status: None   Collection Time: 10/16/19  7:48 AM   Specimen: Pleural Fluid  Result Value Ref Range Status   Specimen Description   Final    PLEURAL Performed at [facility] [address], [place], [place] [address]    Special Requests   Final    NONE Performed at [facility], [place] [address], [place], [place] [address]    Gram Stain   Final    RARE WBC PRESENT, PREDOMINANTLY MONONUCLEAR NO ORGANISMS SEEN    Culture   Final    NO GROWTH 3 DAYS Performed at  [facility], [place] [address] [place], [place] [address]    Report Status 10/19/2019 FINAL  Final  Fungus Culture With Stain     Status: None (Preliminary result)   Collection Time: 10/16/19  7:48 AM   Specimen: Pleural Fluid  Result Value Ref Range Status   Fungus Stain Final report  Final    Comment: (NOTE) Performed At: [facility] [address], [place] [id number] Rush Farmer MD [id number]    Fungus (Mycology) Culture PENDING  Incomplete   Fungal Source PLEURAL  Final    Comment: Performed at [facility], [place] [address], [place], [place] [address]  Fungus Culture Result     Status: None   Collection Time: 10/16/19  7:48 AM  Result Value Ref Range Status   Result 1 Comment  Final    Comment: (NOTE) KOH/Calcofluor preparation:  no fungus observed. Performed At: [facility] [place], [place] [id number] Rush Farmer MD [id number]   Blood culture (routine x 2)     Status: None (Preliminary result)   Collection Time: 10/16/19  9:42 PM   Specimen: BLOOD  Result Value Ref Range Status   Specimen Description   Final    BLOOD LEFT HAND Performed at [facility], [place] [address], [place], [place] [address]    Special Requests   Final    BOTTLES DRAWN AEROBIC ONLY Blood Culture adequate volume Performed at [place] [address] [place], [place] [address]    Culture  Final    NO GROWTH 2 DAYS Performed at [facility], [place] [address], [place], [place] [address]    Report Status PENDING  Incomplete      Studies:  DG Chest 1 View  Result Date: 10/19/2019 CLINICAL DATA:  Pleural effusion EXAM: CHEST  1 VIEW COMPARISON:  10/17/2019 FINDINGS: Unchanged position of left IJ approach central venous catheter with tip in the lower SVC. Mild cardiomegaly. The left hemithorax remains completely opacified. IMPRESSION: Unchanged complete opacification of the  left hemithorax. Electronically Signed   By: Ulyses Jarred M.D.   On: 10/19/2019 04:42    Assessment: 59 y.o. [place], [place] woman status post right breast biopsy x2 on 07/10/2018 for multicentric invasive ductal carcinoma, clinically T1c N1, stage IIB, grade 3, functionally triple negative, with an MIB-1 of 70% (a) right axillary lymph node biopsied at the same time was positive  (1) neoadjuvant chemotherapy consisting of doxorubicin and cyclophosphamide in dose dense fashion x4 started 08/07/2018, completed 09/16/2018, followed by weekly Abraxane and carboplatin x12 starting 08/07/2018, stopped after 2 cycles (last dose on 10/15/2018) due to peripheral neuropathy   (2) Right breast lumpectomy on 12/27/2018 shows a ypT1b ypN0 residual invasive ductal carcinoma, margins negative. (a) 5 sentinel nodes were negative.  (b) Estrogen receptor 75% positive, weak, Progesterone receptor negative.  (3) adjuvant radiation  Radiation Treatment Dates: 02/18/2019 through 04/02/2019 Site Technique Total Dose (Gy) Dose per Fx (Gy) Completed Fx Beam Energies  Breast: Breast_Rt 3D 50.4/50.4 1.8 28/28 6X, 10X  Breast: Breast_Rt_axilla 3D 50.4/50.4 1.8 28/28 6X, 10X  Breast: Breast_Rt_Bst 3D 20/20 2 10/10 6X, 10X    (4) genetics testing 08/06/2018 through the Breast + GYN Cancers Panel offered by [facility] found no deleterious mutationsin ATM, BARD1, BRCA1, BRCA2, BRIP1, CDH1, CHEK2, DICER1, EPCAM, MLH1, MSH2, MSH6, NBN, NF1, PALB2, PMS2, PTEN, RAD50, RAD51C, RAD51D, SMARCA4, STK11, TP53.   (5) anastrozole started 05/14/2018 (a) bone density 09/22/2019 shows a T score of -0.8 (normal).  METASTATIC DISEASE: June 2021 (6)CT renalstonestudy 09/30/2019 to evaluate left flank pain shows left pleural effusion and possible liver lesions (a)cytology from the left pleural effusion 10/03/2019 confirms metastatic disease (b)liver MRI  10/04/2019 shows multiple liver lesions (c) prognostic panel requested 10/10/2019 on cytology from 10/03/2019 flui  (7) left effusion: left pleurx placement 10/13/2019  (a) loculated? Consider chest tube/pleurodesis  (8) pain: currently on baseline fentanyl 25 mcg/h patch  (a) naproxen/acetaminophen TID   (b) morphine PRN  (c) bowel prophylaxis   Plan:  I discussed situation with the patient and daughter [name]. Patient's plerx has not been draining (10 cc this AM per pt's report), possibly due to loculation or malposition. I think it may be useful to consult PUL re possible chest tube/pleurodesis. Discussed with patient who agrees to consider.  Pain is primarily in "stomach" (lower abd) not lung; also pain in back. Thinks fentanyl may be helping, does not tolerate norco; does OK with liquid morphine. Have written for naproxen/tylenol TID and liquid morphine PRN, continuing fentanyl. It may be useful to ask palliative care to assist with pain management  Family is considering second opinion re cancer therapy but have not yet made any appts. Once patient is stable re pulmonary and pain issues we can start capecitabine (outpatient)  Will follow with you   Chauncey Cruel, MD 10/20/2019  8:30 AM Medical Oncology and Hematology [facility] [place], [place] [address] Tel. [phone]    Fax. [phone]

## 2019-10-21 ENCOUNTER — Inpatient Hospital Stay: Payer: 59

## 2019-10-21 ENCOUNTER — Inpatient Hospital Stay: Payer: 59 | Admitting: Adult Health

## 2019-10-21 ENCOUNTER — Inpatient Hospital Stay: Payer: 59 | Admitting: Internal Medicine

## 2019-10-21 DIAGNOSIS — J45909 Unspecified asthma, uncomplicated: Secondary | ICD-10-CM

## 2019-10-21 LAB — GLUCOSE, CAPILLARY
Glucose-Capillary: 105 mg/dL — ABNORMAL HIGH (ref 70–99)
Glucose-Capillary: 105 mg/dL — ABNORMAL HIGH (ref 70–99)
Glucose-Capillary: 129 mg/dL — ABNORMAL HIGH (ref 70–99)
Glucose-Capillary: 129 mg/dL — ABNORMAL HIGH (ref 70–99)
Glucose-Capillary: 141 mg/dL — ABNORMAL HIGH (ref 70–99)
Glucose-Capillary: 163 mg/dL — ABNORMAL HIGH (ref 70–99)

## 2019-10-21 LAB — CBC WITH DIFFERENTIAL/PLATELET
Abs Immature Granulocytes: 0.1 10*3/uL — ABNORMAL HIGH (ref 0.00–0.07)
Basophils Absolute: 0 10*3/uL (ref 0.0–0.1)
Basophils Relative: 1 %
Eosinophils Absolute: 0.2 10*3/uL (ref 0.0–0.5)
Eosinophils Relative: 3 %
HCT: 27.4 % — ABNORMAL LOW (ref 36.0–46.0)
Hemoglobin: 8.8 g/dL — ABNORMAL LOW (ref 12.0–15.0)
Immature Granulocytes: 2 %
Lymphocytes Relative: 16 %
Lymphs Abs: 1 10*3/uL (ref 0.7–4.0)
MCH: 29.2 pg (ref 26.0–34.0)
MCHC: 32.1 g/dL (ref 30.0–36.0)
MCV: 91 fL (ref 80.0–100.0)
Monocytes Absolute: 0.5 10*3/uL (ref 0.1–1.0)
Monocytes Relative: 9 %
Neutro Abs: 4.3 10*3/uL (ref 1.7–7.7)
Neutrophils Relative %: 69 %
Platelets: 372 10*3/uL (ref 150–400)
RBC: 3.01 MIL/uL — ABNORMAL LOW (ref 3.87–5.11)
RDW: 13.6 % (ref 11.5–15.5)
WBC: 6 10*3/uL (ref 4.0–10.5)
nRBC: 0 % (ref 0.0–0.2)

## 2019-10-21 LAB — COMPREHENSIVE METABOLIC PANEL
ALT: 23 U/L (ref 0–44)
AST: 45 U/L — ABNORMAL HIGH (ref 15–41)
Albumin: 2.6 g/dL — ABNORMAL LOW (ref 3.5–5.0)
Alkaline Phosphatase: 58 U/L (ref 38–126)
Anion gap: 10 (ref 5–15)
BUN: 24 mg/dL — ABNORMAL HIGH (ref 6–20)
CO2: 26 mmol/L (ref 22–32)
Calcium: 9.2 mg/dL (ref 8.9–10.3)
Chloride: 103 mmol/L (ref 98–111)
Creatinine, Ser: 0.7 mg/dL (ref 0.44–1.00)
GFR calc Af Amer: >60 mL/min (ref >60)
GFR calc non Af Amer: >60 mL/min (ref >60)
Glucose, Bld: 113 mg/dL — ABNORMAL HIGH (ref 70–99)
Potassium: 4 mmol/L (ref 3.5–5.1)
Sodium: 139 mmol/L (ref 135–145)
Total Bilirubin: 0.5 mg/dL (ref 0.3–1.2)
Total Protein: 6 g/dL — ABNORMAL LOW (ref 6.5–8.1)

## 2019-10-21 LAB — HEMOGLOBIN AND HEMATOCRIT, BLOOD
HCT: 28.9 % — ABNORMAL LOW (ref 36.0–46.0)
Hemoglobin: 9.3 g/dL — ABNORMAL LOW (ref 12.0–15.0)

## 2019-10-21 LAB — CULTURE, BLOOD (ROUTINE X 2): Culture: NO GROWTH

## 2019-10-21 LAB — MAGNESIUM: Magnesium: 2.1 mg/dL (ref 1.7–2.4)

## 2019-10-21 MED ORDER — MORPHINE SULFATE 10 MG/5ML PO SOLN
5.0000 mg | ORAL | Status: DC | PRN
  Administered 2019-10-21: 5 mg via ORAL
  Administered 2019-10-21: 10 mg via ORAL
  Filled 2019-10-21 (×2): qty 5

## 2019-10-21 MED ORDER — HYDROMORPHONE HCL 1 MG/ML IJ SOLN
1.0000 mg | INTRAMUSCULAR | Status: DC | PRN
  Administered 2019-10-21: 1 mg via INTRAVENOUS
  Filled 2019-10-21: qty 1

## 2019-10-21 MED ORDER — MORPHINE SULFATE (PF) 2 MG/ML IV SOLN
2.0000 mg | INTRAVENOUS | Status: DC | PRN
  Administered 2019-10-21 – 2019-11-10 (×38): 2 mg via INTRAVENOUS
  Filled 2019-10-21 (×38): qty 1

## 2019-10-21 MED ORDER — ORAL CARE MOUTH RINSE
15.0000 mL | Freq: Two times a day (BID) | OROMUCOSAL | Status: DC
  Administered 2019-10-22 – 2019-11-13 (×26): 15 mL via OROMUCOSAL

## 2019-10-21 MED ORDER — ONDANSETRON HCL 4 MG/2ML IJ SOLN
4.0000 mg | INTRAMUSCULAR | Status: DC | PRN
  Administered 2019-10-21 – 2019-10-24 (×3): 4 mg via INTRAVENOUS
  Filled 2019-10-21 (×3): qty 2

## 2019-10-21 MED ORDER — ENSURE ENLIVE PO LIQD
237.0000 mL | Freq: Three times a day (TID) | ORAL | Status: DC
  Administered 2019-10-21 – 2019-11-08 (×14): 237 mL via ORAL

## 2019-10-21 MED ORDER — SODIUM CHLORIDE (PF) 0.9 % IJ SOLN
10.0000 mg | Freq: Once | INTRAMUSCULAR | Status: AC
  Administered 2019-10-21: 10 mg via INTRAPLEURAL
  Filled 2019-10-21: qty 10

## 2019-10-21 MED ORDER — STERILE WATER FOR INJECTION IJ SOLN
5.0000 mg | Freq: Once | RESPIRATORY_TRACT | Status: DC
  Filled 2019-10-21: qty 5

## 2019-10-21 NOTE — Progress Notes (Addendum)
Chaplain follow up with pt regarding Advance Directive   Assisted in notarization of Advance Directive.  Original and copies with pt.  Copy placed in chart.

## 2019-10-21 NOTE — Progress Notes (Signed)
Verbal consent obtained.  Reviewed small risk of bleeding.  Alteplase instilled into left pleurX catheter per protocol.  Daughter at bedside.    Will follow up with Dornase.     Noe Gens, MSN, NP-C [facility] 10/21/2019, 1:13 PM   Please see [email or web address] for pager details.

## 2019-10-21 NOTE — Progress Notes (Signed)
PROGRESS NOTE    Autumn Cook  YBW:[id number] DOB: [birth year] DOA: 10/16/2019 PCP: Darreld Mclean, MD  Brief Narrative:Patient is a 59 year old female with history of right breast cancer, hypertension, hyperlipidemia, type 2 diabetes mellitus, asthma who presents to the emergency department with complaints of central sharp chest pain as well as shortness of breath. She was recently admitted here and discharged 2 days prior to presentation when she underwent Pleurx catheter placement by PCCM after she was found to have large left pleural effusion secondary to her breast cancer. Patient did not know how to use the Pleurx catheter at home. On presentation, chest x-ray showed complete white out of the left lung but there was also suspicion for new consolidations on the right side concerning for pneumonia for which she was started on antibiotics.  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 She is on 2 L of oxygen at home at baseline. She was recently admitted here and underwent left-sided Pleurx catheter but she did not know how to operate it at home. Currently her respiratory status is at baseline.  She denies having any dyspnea but concerned that the Pleurx is not draining.  CXR  on 10/19/2019 per report unchanged  complete opacification of the left hemithorax. -Consulted pulmonary.  CT chest yesterday per report complex left pleural effusion occupying the entire left hemithorax. -Plan for TPA/dornase per pulmonary to help with the drainage of the Pleurx.  Suspected HCAP:CXR onpresentation showed possible consolidations on the right side.She is not febrile or septic. She wasstarted on cefepime by admitting physician.Antibiotics have been stopped as there was very low suspicion for pneumonia.  Procalcitonin also was negative.  Malignantneoplasm of the right breast: [name] is following. Notstarted on chemotherapy yet.  Hypertension: Blood pressure stable at this time.  Continue atenolol.  Continue to monitor and adjust medications as needed.  Diabetes mellitus: A1c of 7.8. Continue sliding scaleinsulin for now.She is not on any medications at home. Consider Metformin 500 mg twice a day on discharge.  Hypomagnesemia:  Magnesium replaced.  Potassium stable.  Goals of care:Palliative care consulted for pain management, goals of care.  Pain control- Family is concerned that patient is always in 10 out of 10 pain.  Patient unable to tolerate Norco or OxyContin.  Seen by oncology and they started her on naproxen, liquid morphine.  Also on fentanyl patch.  Palliative care helping with pain management.  Appreciate help.  Continue bowel regimen.  Estimated body mass index is 28.57 kg/m as calculated from the following:   Height as of this encounter: 5\' 6"  (1.676 m).   Weight as of this encounter: 80.3 kg.   DVT prophylaxis:Lovenox Code Status:Full Family Communication: Discussed with daughter at bedside. Status is: Inpatient  Remains inpatient appropriate because:Unsafe d/c plan   Dispo: The patient is from:Home Anticipated d/c is  [place]:[facility] with home health (case management consulted) Anticipated d/c date is: 2 day Patient currently  is not medically stable to d/c.  Patient did not feel ready to go home today.   Consultants:PCCM  Procedures:None  Antimicrobials: None  Subjective: Denies having any nausea, vomiting or diarrhea.  On bowel regimen to prevent constipation.  Denies any shortness of breath or dyspnea on exertion at this time.  Plan for dornase/TPA to help with Pleurx draining. Objective: Vitals:   10/20/19 2015 10/21/19 0553 10/21/19 0752 10/21/19 1227  BP: 104/68 120/82  121/75  Pulse: 85 80  86  Resp: 18 15  18   Temp: 98.8 F (37.1 C) 98.5 F (36.9 C)  98.1 F (36.7 C)  TempSrc: Oral Oral  Oral  SpO2: 98% 99% 98% 100%  Weight:      Height:        Intake/Output Summary (Last 24 hours) at 10/21/2019 1527 Last data filed at 10/21/2019 0700 Gross per 24 hour  Intake 250 ml  Output 410 ml  Net -160 ml   Filed Weights   10/16/19 0431  Weight: 80.3 kg    Examination: General exam: Appears calm and comfortable  Respiratory system: Clear to auscultation on the right Respiratory effort normal.  Drain in the left chest noted.  Decreased breath sounds on the left. Cardiovascular system: S1 & S2 heard, RRR. No murmur. No pedal edema. Gastrointestinal system: Abdomen is nondistended, soft, nonspecific tenderness without any guarding or rebound. No masses felt. Normal bowel sounds heard. Central nervous system: Alert and oriented. No focal neurological deficits. Extremities: Symmetric 5 x 5 power. Skin: No rashes, lesions or ulcers Psychiatry: Judgement and insight appear normal. Mood & affect appropriate.     Data Reviewed: I have personally reviewed following labs and imaging studies  CBC: Recent Labs  Lab 10/17/19 0519 10/18/19 0338 10/19/19 0352 10/20/19 0405 10/21/19 0504  WBC 6.9 7.4 8.2 7.8 6.0  NEUTROABS 5.1 5.9 5.9 5.9 4.3  HGB 10.4* 9.5* 9.6* 9.4* 8.8*  HCT 31.2* 29.7* 29.5* 29.6* 27.4*  MCV 89.7 89.5 89.9 91.1 91.0  PLT 377 389 384 378 270   Basic Metabolic  Panel: Recent Labs  Lab 10/17/19 0519 10/18/19 0338 10/19/19 0352 10/20/19 0405 10/21/19 0504  NA 137 139 139 140 139  K 3.9 4.1 4.9 4.4 4.0  CL 102 104 104 104 103  CO2 21* 24 27 26 26   GLUCOSE 166* 144* 141* 119* 113*  BUN 17 17 17 17  24*  CREATININE 0.67 0.62 0.78 0.68 0.70  CALCIUM 9.0 8.9 9.2 9.3 9.2  MG 1.6* 1.9 1.8 1.7 2.1  PHOS 3.0  --   --   --   --    GFR: Estimated Creatinine Clearance: 82.9 mL/min (by C-G formula based on SCr of 0.7 mg/dL). Liver Function Tests: Recent Labs  Lab 10/17/19 0519 10/18/19 0338 10/19/19 0352 10/20/19 0405 10/21/19 0504  AST 27 32 32 41 45*  ALT 16 22 20 23 23   ALKPHOS 49 51 56 58 58  BILITOT 0.4 0.3 0.5 0.5 0.5  PROT 5.9* 5.7* 6.2* 6.1* 6.0*  ALBUMIN 2.6* 2.4* 2.6* 2.6* 2.6*   No results for input(s): LIPASE, AMYLASE in the last 168 hours. No results for input(s): AMMONIA in the last 168 hours. Coagulation Profile: No results for input(s): INR, PROTIME in the last 168 hours. Cardiac Enzymes: No results for input(s): CKTOTAL, CKMB, CKMBINDEX, TROPONINI in the last 168 hours. BNP (last 3 results) No results for input(s): PROBNP in  the last 8760 hours. HbA1C: No results for input(s): HGBA1C in the last 72 hours. CBG: Recent Labs  Lab 10/20/19 2012 10/20/19 2304 10/21/19 0255 10/21/19 0736 10/21/19 1158  GLUCAP 176* 145* 129* 129* 141*   Lipid Profile: No results for input(s): CHOL, HDL, LDLCALC, TRIG, CHOLHDL, LDLDIRECT in the last 72 hours. Thyroid Function Tests: No results for input(s): TSH, T4TOTAL, FREET4, T3FREE, THYROIDAB in the last 72 hours. Anemia Panel: No results for input(s): VITAMINB12, FOLATE, FERRITIN, TIBC, IRON, RETICCTPCT in the last 72 hours. Sepsis Labs: Recent Labs  Lab 10/16/19 1608 10/17/19 0519  PROCALCITON <0.10 <0.10  LATICACIDVEN 1.7  --     Recent Results (from the past 240 hour(s))  MRSA PCR Screening     Status: None   Collection Time: 10/13/19  9:37 AM   Specimen: Nasal  Mucosa; Nasopharyngeal  Result Value Ref Range Status   MRSA by PCR NEGATIVE NEGATIVE Final    Comment:        The GeneXpert MRSA Assay (FDA approved for NASAL specimens only), is one component of a comprehensive MRSA colonization surveillance program. It is not intended to diagnose MRSA infection nor to guide or monitor treatment for MRSA infections. Performed at [facility], [place] [address], [place], [place] [address]   SARS Coronavirus 2 by RT PCR (hospital order, performed in [facility] hospital lab) Nasopharyngeal Nasopharyngeal Swab     Status: None   Collection Time: 10/16/19 12:43 AM   Specimen: Nasopharyngeal Swab  Result Value Ref Range Status   SARS Coronavirus 2 NEGATIVE NEGATIVE Final    Comment: (NOTE) SARS-CoV-2 target nucleic acids are NOT DETECTED.  The SARS-CoV-2 RNA is generally detectable in upper and lower respiratory specimens during the acute phase of infection. The lowest concentration of SARS-CoV-2 viral copies this assay can detect is 250 copies / mL. A negative result does not preclude SARS-CoV-2 infection and should not be used as the sole basis for treatment or other patient management decisions.  A negative result may occur with improper specimen collection / handling, submission of specimen other than nasopharyngeal swab, presence of viral mutation(s) within the areas targeted by this assay, and inadequate number of viral copies (<250 copies / mL). A negative result must be combined with clinical observations, patient history, and epidemiological information.  Fact Sheet for Patients:   StrictlyIdeas.no  Fact Sheet for Healthcare Providers: BankingDealers.co.za  This test is not yet approved or  cleared by the [place] FDA and has been authorized for detection and/or diagnosis of SARS-CoV-2 by FDA under an Emergency Use Authorization (EUA).  This EUA will remain in effect  (meaning this test can be used) for the duration of the COVID-19 declaration under Section 564(b)(1) of the Act, 21 U.S.C. section 360bbb-3(b)(1), unless the authorization is terminated or revoked sooner.  Performed at [facility], [address] [place], [place] [address]   Blood culture (routine x 2)     Status: None   Collection Time: 10/16/19  5:20 AM   Specimen: BLOOD LEFT ARM  Result Value Ref Range Status   Specimen Description   Final    BLOOD LEFT ARM Performed at [facility] [address] [place], [place] [address]    Special Requests   Final    BOTTLES DRAWN AEROBIC AND ANAEROBIC Blood Culture results may not be optimal due to an inadequate volume of blood received in culture bottles Performed at [facility]  [address] [place], [place] [address]    Culture   Final    NO GROWTH 5 DAYS Performed at [facility], [place] [address], [place], [place] [address]    Report Status 10/21/2019 FINAL  Final  Body fluid culture (includes gram stain)     Status: None   Collection Time: 10/16/19  7:48 AM   Specimen: Pleural Fluid  Result Value Ref Range Status   Specimen Description   Final    PLEURAL Performed at [facility] [address], [place], [place] [address]    Special Requests   Final    NONE Performed at [facility], [place] [address], [place], [place] [address]    Gram Stain   Final    RARE WBC PRESENT, PREDOMINANTLY MONONUCLEAR NO ORGANISMS SEEN    Culture   Final    NO GROWTH 3 DAYS Performed at [facility], [place] [address], [place], [place] [address]    Report Status 10/19/2019 FINAL  Final  Fungus Culture With Stain     Status: None (Preliminary result)   Collection Time: 10/16/19  7:48 AM   Specimen: Pleural Fluid  Result Value Ref Range Status   Fungus Stain Final report  Final    Comment: (NOTE) Performed At: [facility] [address] [place], [place] [id number] Rush Farmer MD [id number]    Fungus (Mycology) Culture PENDING  Incomplete   Fungal Source PLEURAL  Final    Comment: Performed at [facility], [place] [address] [place], [place] [address]  Fungus Culture Result     Status: None   Collection Time: 10/16/19  7:48 AM  Result Value Ref Range Status   Result 1 Comment  Final    Comment: (NOTE) KOH/Calcofluor preparation:  no fungus observed. Performed At: [facility] [place], [place] [id number] Rush Farmer MD [id number]   Blood culture (routine x 2)     Status: None (Preliminary result)   Collection Time: 10/16/19  9:42 PM   Specimen: BLOOD  Result Value Ref Range Status   Specimen Description   Final    BLOOD LEFT HAND Performed at [facility], [place] [address] [place], [place] [address]    Special Requests   Final    BOTTLES DRAWN AEROBIC ONLY Blood Culture adequate volume Performed at [facility] [address], [place], [place] [address]    Culture   Final    NO GROWTH 4 DAYS Performed at [facility], [place] [address], [place], [place] [address]    Report Status PENDING  Incomplete         Radiology Studies: CT CHEST WO CONTRAST  Result Date: 10/20/2019 CLINICAL DATA:  Abnormal chest radiograph, pleural effusion. History of breast cancer. Increasing shortness of breath and left-sided chest pain. No drainage from pleural catheter. EXAM: CT CHEST WITHOUT CONTRAST TECHNIQUE: Multidetector CT imaging of the chest was performed following the standard protocol without IV contrast. COMPARISON:  CT abdomen pelvis 09/30/2019.  MR abdomen 10/04/2019. FINDINGS: Cardiovascular: Left IJ central line terminates in the upper SVC. Heart is mildly enlarged. There may be a small pericardial effusion and/or pericardial thickening. Mediastinum/Nodes: Low-attenuation thyroid nodules measure up to 1.7 cm on the right  No follow-up recommended unless clinically warranted (ref: J Am Coll Radiol. 2015 Feb;12(2): 143-50).Mediastinal lymph nodes measure up to 8 mm in the prevascular space. Hilar regions are difficult to evaluate without IV contrast. Left  axillary lymph nodes and soft tissue stranding with the largest individual lymph node measuring 9 mm in short axis (2/30). No right axillary adenopathy. Prepericardiac/left juxtadiaphragmatic lymph nodes measure up to 11 mm (2/108). Lungs/Pleura: Pleural fluid occupies the entire left hemithorax. There is extensive associated irregular pleural thickening and left lung collapse. A chest tube terminates in the posterior left hemithorax. Heterogeneity at the base of the left hemithorax may represent a combination of atelectatic lung, complex fluid and/or pleural thickening (2/95). Difficult to exclude an underlying mass. Obstruction/narrowing of the left upper and left lower lobe bronchi. Rightward mass effect on the heart and mediastinum. Patchy ground-glass in the apical segment right upper lobe. 5 mm right lower lobe nodule (5/84). Upper Abdomen: Well-circumscribed low-attenuation lesion in the periphery of the liver measures at least 3.8 cm, incompletely imaged but similar to 09/30/2019. Additional vague areas of low-attenuation are seen in the liver and were better seen on 10/04/2019. Visualized portions of the liver, adrenal glands, kidneys, spleen, stomach and bowel are otherwise unremarkable. Musculoskeletal: No definite worrisome lytic or sclerotic lesions. IMPRESSION: 1. Complex left pleural effusion occupies the entire left hemithorax, with extensive associated nodular pleural thickening, consistent with metastatic disease. Findings are progressive from 09/30/2019. Left chest tube is in place. Given the complexity of the findings, it is unlikely that much drainage will be seen. 2. Inferior right breast mass, as on 09/30/2019. Hepatic metastatic disease, better seen on  10/04/2019. 3. Borderline enlarged mediastinal and prepericardiac/left juxtadiaphragmatic lymph nodes. 4. Right lower lobe pulmonary nodule, indeterminate. 5. Patchy ground-glass in the apical segment right upper lobe may be infectious or inflammatory in etiology. 6. Small pericardial effusion/thickening. Electronically Signed   By: Lorin Picket M.D.   On: 10/20/2019 15:20    Scheduled Meds: . acetaminophen  500 mg Oral TID WC  . atenolol  100 mg Oral Daily  . Chlorhexidine Gluconate Cloth  6 each Topical Daily  . docusate sodium  200 mg Oral Daily  . pulmozyme (DORNASE) for intrapleural administration  5 mg Intrapleural Once  . enoxaparin (LOVENOX) injection  40 mg Subcutaneous Q24H  . feeding supplement (ENSURE ENLIVE)  237 mL Oral TID BM  . fentaNYL  1 patch Transdermal Q72H  . insulin aspart  0-15 Units Subcutaneous Q4H  . levalbuterol  1.25 mg Nebulization BID  . naproxen  250 mg Oral TID WC  . polyethylene glycol  17 g Oral Daily  . sodium chloride flush  10-40 mL Intracatheter Q12H  . sucralfate  1 g Oral TID WC & HS   Continuous Infusions:   LOS: 5 days     Yaakov Guthrie, MD Pager on amion  10/21/2019, 3:27 PM

## 2019-10-21 NOTE — Progress Notes (Signed)
NAME:  Autumn Cook, MRN:  [id number], DOB:  Mar 22, 1961, LOS: 5 ADMISSION DATE:  10/16/2019, CONSULTATION DATE:  10/20/2019 REFERRING MD:  Dr. Barth Kirks, CHIEF COMPLAINT:  Pleural effusion   Brief History   59 yo female former smoker with metastatic breast cancer complicated by malignant pleural effusion.  She had pleurx catheter placed on 10/14/19, but continues to have white out of Lt hemithorax on chest xray.  PCCM asked to assess.  History of present illness   59 yo female diagnosed with multicentric invasive ductal carcinoma after having abnormal mammogram on 07/01/18.  She was found to have Rt axillary nodal involvement.  She had neoadjuvant chemotherapy with doxorubicin and cyclophosphamide from 08/07/18 to 09/06/18.  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  She had adjuvant radiation therapy to Rt breast and axilla from 1013/20 to 04/02/19.  She was started on anastrozole on 05/14/18.  She was seen by oncology on 09/30/19 and noted to have Lt flank pain, dry cough, fatigue, and chest tightness.  She was treated for possible respiratory infection and asthma exacerbation.  She had renal stone CT study.  Found to have large Lt pleural effusion and 2 hypoattenuating lesions on her liver.  She had Lt thoracentesis by IR on 10/03/19 with 950 ml fluid removed.  Fluid analysis showed malignant cells on cytology review.  Her breathing continued to get worse.  She went to hospital on 10/09/19.  She had pleurx catheter placed on 10/13/19.  She was noted to have 1800 ml of fluid removed.  However, CXR didn't show significant change.  She was discharged home on 10/14/19.    She returned to hospital on 10/16/19.  She was still feeling short of breath and had sharp chest pain.  CXR showed white out of Lt hemithorax.  Staff unable to drain fluid from pleurx catheter.  She wasn't able to get supplies at home to drain catheter before she had to come back to  ER.  Pain symptoms better since her pain medications adjusted.  Still short of breath with activity and now needing supplemental oxygen.  Not having sputum, fever, or hemoptysis.  Past Medical History  Allergies, Asthma, DM, HTN, Migraine headache  Significant Hospital Events   6/10 Admit 6/14 L PleurX flushed with 60 ml saline  Consults:  Oncology  Procedures:  Lt IJ CVL 6/10 >>   Significant Diagnostic Tests:  Lt thoracentesis 10/03/19 >> protein 5.4, LDH 1473, WBC 3118 (46% lymphocytes, 34% macrophages, 20% neutrophils), cytology positive for malignant cells from breast primary  Micro Data:  Lt pleural fluid 6/10 >>  Blood 6/10 >>   Antimicrobials:  Vancomycin 6/09 Cefepime 6/09 >> 6/11  Interim history/subjective:  Approximately 500 ml total drained from chest tube after flushing 6/14.   Afebrile  Pt reports pain at tube site / left back, ribs  Objective   Blood pressure 120/82, pulse 80, temperature 98.5 F (36.9 C), temperature source Oral, resp. rate 15, height 5\' 6"  (1.676 m), weight 80.3 kg, last menstrual period 11/14/2013, SpO2 98 %.        Intake/Output Summary (Last 24 hours) at 10/21/2019 1028 Last data filed at 10/21/2019 0700 Gross per 24 hour  Intake 310 ml  Output 410 ml  Net -100 ml   Filed Weights   10/16/19 0431  Weight: 80.3 kg    Examination: General: adult female lying in bed in NAD, daughter at bedside HEENT: MM pink/moist, anicteric Neuro:  AAOx4, speech clear, MAE  CV: s1s2 RRR, no m/r/g PULM: non-labored, decreased breath sounds on left / bronchial breath sounds  GI: soft, bsx4 active  Extremities: warm/dry, no edema  Skin: no rashes or lesions  Assessment & Plan:   Opacification of Lt hemithorax with recent diagnosis of Lt malignant pleural effusion 2nd to breast cancer based on cytology results from 10/03/19. S/P pleurX catheter placement  -flushed with 60 ml NS 6/14 with 522ml returned  -consider tPA / dornase, will discuss  with attending MD  -will need to ensure she has appropriate drainage equipment at home -follow intermittent CXR   Asthma -continue xopenex   Hypoxia. -wean O2 for saturations >92%  Labs   CBC: Recent Labs  Lab 10/17/19 0519 10/18/19 0338 10/19/19 0352 10/20/19 0405 10/21/19 0504  WBC 6.9 7.4 8.2 7.8 6.0  NEUTROABS 5.1 5.9 5.9 5.9 4.3  HGB 10.4* 9.5* 9.6* 9.4* 8.8*  HCT 31.2* 29.7* 29.5* 29.6* 27.4*  MCV 89.7 89.5 89.9 91.1 91.0  PLT 377 389 384 378 412    Basic Metabolic Panel: Recent Labs  Lab 10/17/19 0519 10/18/19 0338 10/19/19 0352 10/20/19 0405 10/21/19 0504  NA 137 139 139 140 139  K 3.9 4.1 4.9 4.4 4.0  CL 102 104 104 104 103  CO2 21* 24 27 26 26   GLUCOSE 166* 144* 141* 119* 113*  BUN 17 17 17 17  24*  CREATININE 0.67 0.62 0.78 0.68 0.70  CALCIUM 9.0 8.9 9.2 9.3 9.2  MG 1.6* 1.9 1.8 1.7 2.1  PHOS 3.0  --   --   --   --    GFR: Estimated Creatinine Clearance: 82.9 mL/min (by C-G formula based on SCr of 0.7 mg/dL). Recent Labs  Lab 10/16/19 1608 10/16/19 1608 10/17/19 0519 10/17/19 0519 10/18/19 0338 10/19/19 0352 10/20/19 0405 10/21/19 0504  PROCALCITON <0.10  --  <0.10  --   --   --   --   --   WBC 6.9   < > 6.9   < > 7.4 8.2 7.8 6.0  LATICACIDVEN 1.7  --   --   --   --   --   --   --    < > = values in this interval not displayed.    Liver Function Tests: Recent Labs  Lab 10/17/19 0519 10/18/19 0338 10/19/19 0352 10/20/19 0405 10/21/19 0504  AST 27 32 32 41 45*  ALT 16 22 20 23 23   ALKPHOS 49 51 56 58 58  BILITOT 0.4 0.3 0.5 0.5 0.5  PROT 5.9* 5.7* 6.2* 6.1* 6.0*  ALBUMIN 2.6* 2.4* 2.6* 2.6* 2.6*   No results for input(s): LIPASE, AMYLASE in the last 168 hours. No results for input(s): AMMONIA in the last 168 hours.  ABG    Component Value Date/Time   HCO3 29.1 (H) 03/07/2016 1420   TCO2 31 03/07/2016 1420   O2SAT 44.0 03/07/2016 1420     Coagulation Profile: No results for input(s): INR, PROTIME in the last 168  hours.  Cardiac Enzymes: No results for input(s): CKTOTAL, CKMB, CKMBINDEX, TROPONINI in the last 168 hours.  HbA1C: Hgb A1c MFr Bld  Date/Time Value Ref Range Status  10/11/2019 04:54 AM 7.8 (H) 4.8 - 5.6 % Final    Comment:    (NOTE) Pre diabetes:          5.7%-6.4% Diabetes:              >6.4% Glycemic control for   <7.0% adults with diabetes  12/02/2018 10:42 AM 5.7 4.6 - 6.5 % Final    Comment:    Glycemic Control Guidelines for People with Diabetes:Non Diabetic:  <6%Goal of Therapy: <7%Additional Action Suggested:  >8%     CBG: Recent Labs  Lab 10/20/19 1617 10/20/19 2012 10/20/19 2304 10/21/19 0255 10/21/19 0736  GLUCAP 112* 176* 145* 129* 129*     Signature:   Noe Gens, MSN, NP-C [facility] 10/21/2019, 10:28 AM   Please see [email or web address] for pager details.

## 2019-10-21 NOTE — TOC Progression Note (Signed)
Transition of Care [facility]) - Progression Note    Patient Details  Name: Autumn Cook MRN: [id number] Date of Birth: 07-12-1960  [facility] CM/SW Contact  Emalene Welte, Juliann Pulse, RN Phone Number: 10/21/2019, 3:31 PM  Clinical Narrative:      Expected Discharge Plan: [place] Barriers to Discharge: No [facility] will accept this patient  Expected Discharge Plan and Services Expected Discharge Plan: [place]   Discharge Planning Services: CM Consult   Living arrangements for the past 2 months: Single Family Home Expected Discharge Date:  (unknown)                                     Social Determinants of Health (SDOH) Interventions    Readmission Risk Interventions No flowsheet data found.

## 2019-10-21 NOTE — TOC Benefit Eligibility Note (Signed)
Transition of Care [facility]) Benefit Eligibility Note    Patient Details  Name: Autumn Cook MRN: [id number] Date of Birth: 1961/05/04   Medication/Dose: NA        Prescription Coverage Preferred Pharmacy: NA  Spoke with Person/Company/Phone Number:: [employer]/ [employer] [phone] for network provders for [facility]  Co-Pay: NA        Additional Notes: HH    Kerin Salen Phone Number: 10/21/2019, 4:02 PM

## 2019-10-21 NOTE — Progress Notes (Signed)
Daily Progress Note   Patient Name: Autumn Cook       Date: 10/21/2019 DOB: October 24, 1960  Age: 59 y.o. MRN#: [id number] Attending Physician: Yaakov Guthrie, MD Primary Care Physician: Darreld Mclean, MD Admit Date: 10/16/2019  Reason for Consultation/Follow-up: Non pain symptom management and Pain control  Subjective: I saw and examined [name] today.  Her daughter was present at the bedside as well.  She reports that she currently has better control of her pain overall with this regimen.  She still has some pain whenever they manipulate the area of chest tube to try and drain it, but otherwise states that her pain is fairly well controlled management of patch with intermittent breakthrough IV morphine.  Reviewed her use of morphine over the last 24 hours and this included 3 doses of 2 mg IV as well as 1 dose of 4 milligrams IV that was given at time of dressing change.  Total oral morphine equivalent of this is 30 mg of oral morphine.  We also discussed her shortness of breath and how low-dose of opioid may help her whenever she is having dyspnea with a goal of increasing her functional status and what she is able to do for herself at home.  We also discussed bowel regimen.  Reports last bowel movement was yesterday.  Her daughter inquired about spiritual care coming to complete advanced directive that they discussed with chaplain yesterday.  Also requested that case management work to set up home health on discharge.  Length of Stay: 5  Current Medications: Scheduled Meds:   acetaminophen  500 mg Oral TID WC   atenolol  100 mg Oral Daily   Chlorhexidine Gluconate Cloth  6 each Topical Daily   docusate sodium  200 mg Oral Daily   enoxaparin (LOVENOX) injection  40 mg  Subcutaneous Q24H   feeding supplement  1 Container Oral TID BM   fentaNYL  1 patch Transdermal Q72H   insulin aspart  0-15 Units Subcutaneous Q4H   levalbuterol  1.25 mg Nebulization BID   naproxen  250 mg Oral TID WC   polyethylene glycol  17 g Oral Daily   sodium chloride flush  10-40 mL Intracatheter Q12H   sucralfate  1 g Oral TID WC & HS    Continuous Infusions:   PRN Meds:  metoprolol tartrate, morphine, morphine injection, ondansetron (ZOFRAN) IV, sodium chloride flush  Physical Exam     General: Alert, awake, in no acute distress.  HEENT: No goiter, no JVD Heart: Regular rate and rhythm.  Lungs: Fair air movement, chest tube in place.  Abdomen: Soft, nontender, nondistended, positive bowel sounds.  Ext: No significant edema Skin: Warm and dry Neuro: Grossly intact, nonfocal.      Vital Signs: BP 120/82 (BP Location: Left Arm)    Pulse 80    Temp 98.5 F (36.9 C) (Oral)    Resp 15    Ht 5\' 6"  (1.676 m)    Wt 80.3 kg    LMP 11/14/2013    SpO2 98%    BMI 28.57 kg/m  SpO2: SpO2: 98 % O2 Device: O2 Device: Nasal Cannula O2 Flow Rate: O2 Flow Rate (L/min): 2 L/min  Intake/output summary:   Intake/Output Summary (Last 24 hours) at 10/21/2019 1152 Last data filed at 10/21/2019 0700 Gross per 24 hour  Intake 310 ml  Output 410 ml  Net -100 ml   LBM: Last BM Date: 10/19/19 Baseline Weight: Weight: 80.3 kg Most recent weight: Weight: 80.3 kg       Palliative Assessment/Data:      Patient Active Problem List   Diagnosis Date Noted   Generalized pain    General weakness    Palliative care by specialist    Acute respiratory failure ([place]) 10/16/2019   Diabetes mellitus type 2, uncontrolled, with complications ([place]) 11/16/1973   Acute respiratory failure with hypoxia ([place]) 10/16/2019   Pneumonia of right lower lobe due to infectious organism 10/16/2019   Metastases to the liver ([place]) 10/10/2019   Malignant pleural effusion 10/09/2019    Pleural effusion 10/09/2019   Type 2 diabetes mellitus with hyperglycemia ([place]) 10/09/2019   Encounter for central line placement 08/13/2018   Port-A-Cath in place 08/07/2018   Family history of lung cancer    Malignant neoplasm of overlapping sites of right breast in female, estrogen receptor positive ([place]) 07/23/2018   Achilles tendonitis, bilateral 06/11/2015   Chest pain with moderate risk for cardiac etiology 10/13/2014   Hypercholesterolemia 10/13/2014   Idiopathic scoliosis 08/18/2014   Hypertension 09/11/2012   Overweight (BMI 25.0-29.9) 09/11/2012   Migraine headache 09/11/2012   Hx of thyroid nodule 09/11/2012   History of tobacco use 09/11/2012    Palliative Care Assessment & Plan   Patient Profile: 59 year old female with metastatic breast cancer under the care of [name].  She is undergoing chemotherapy and radiation and is status post Pleurx catheter placement with cytology revealing malignant cells.  Currently admitted for discomfort and shortness of breath and pulmonary working to increase drainage from Pleurx catheter.  Plan on discharge she is for follow-up with [name] for further treatment with capecitabine.  Palliative following for pain management.  Recommendations/Plan: -Pain: Reports better control on current regimen.  Currently she is using fentanyl 25 mcg patch and has been utilizing IV morphine for breakthrough pain.  Had additional oral morphine equivalent of 30 mg in the last 24 hours.  Most of her pain is incident pain related to manipulation of tube.  We discussed pain management and options for opioids on discharge.  Discussed utilization of both long and short acting medications and how this can provide best analgesic benefit in light of her advanced cancer.  Discussed potential need to uptitrate fentanyl patch at some point, however, she has only had the oral morphine equivalent of 30 mg of morphine  in the last 24 hours.  I therefore  think she would best be served to transition home with current dose of fentanyl patch as well as oral morphine as needed to see how much her opioid needs change when she is back to her home schedule and regimen.  Her fentanyl patch can certainly be uptitrated once she follows up with oncology.  Recommend continue fentanyl patch 25 mcg/h.  Recommend morphine 5 to 10 mg p.o. (prefers liquid formulation) every 3 hours as needed for breakthrough pain.  I also placed an order for as needed IV morphine to be used as a second line pain medication 45 minutes after oral medication if oral medication is ineffective to relieve her pain. -Constipation: Opioid related.  Continue MiraLAX.  Reports last bowel movement yesterday.  If continues to have constipation, would titrate bowel regimen with addition of senna 2 tabs twice daily. -Goals of care: Reports completing advanced directive but needs to have it notarized and wants to review further with spiritual care.  I called and requested follow-up from spiritual care for her today.  Daughter also reports wanting to ensure that home health is being set up.  I discussed this with case management.  Goals of Care and Additional Recommendations:  Limitations on Scope of Treatment: Full Scope Treatment  Code Status:    Code Status Orders  (From admission, onward)         Start     Ordered   10/16/19 0803  Full code  Continuous        10/16/19 0808        Code Status History    Date Active Date Inactive Code Status Order ID Comments User Context   10/09/2019 2245 10/14/2019 2035 Full Code [id number]  Rise Patience, MD Inpatient   Advance Care Planning Activity       Prognosis:   Unable to determine  Discharge Planning:  Home with [facility] was discussed with patient, daughter, RN  Thank you for allowing the Palliative Medicine Team to assist in the care of this patient.   Time In: 1040 Time Out: 1125 Total Time 45 Prolonged Time  Billed No      Greater than 50%  of this time was spent counseling and coordinating care related to the above assessment and plan.  Micheline Rough, MD  Please contact Palliative Medicine Team phone at [phone] for questions and concerns.

## 2019-10-21 NOTE — Plan of Care (Signed)
  Problem: Education: Goal: Knowledge of General Education information will improve Description: Including pain rating scale, medication(s)/side effects and non-pharmacologic comfort measures Outcome: Progressing   Problem: Nutrition: Goal: Adequate nutrition will be maintained Outcome: Progressing   Problem: Coping: Goal: Level of anxiety will decrease Outcome: Progressing   

## 2019-10-21 NOTE — Progress Notes (Signed)
At [place] during bedside shift report, patient c/o 9/10 pain uncontrolled with PO and IV morphine. Increased uncontrolled pain since TPA injection and connection to drainage system at 1300. Noted that patient had 1981ml of serosanguinous drainage in 6 hours. Melanie, NP contacted about the above and advised to clamp pleurx cath and await further instruction from [facility]. Dr Emmit Alexanders stated to [facility] and expect more drainage. Upon entering room with patient and her two daughters, pt felt increased pain relief with clamping tube and refused for me to unclamp. Explained that her effusion is very large and that it would likely drain 3-4L as CCMD stated. Pt continued to refuse and state that pain was too uncontrolled to tolerate being hooked to drainage any longer. Pt agreed that if she started to be SOB or symptomatic, that she would agree to draining pleurx. Night Provider made aware of the following and made adjustments to pain regimen.

## 2019-10-21 NOTE — Progress Notes (Signed)
Intrapleural dornase instilled per protocol.  Chest tube clamped.       Noe Gens, MSN, NP-C [facility] 10/21/2019, 3:06 PM   Please see [email or web address] for pager details.

## 2019-10-21 NOTE — TOC Progression Note (Signed)
Transition of Care [facility]) - Progression Note    Patient Details  Name: Autumn Cook MRN: [id number] Date of Birth: 1960/06/26  Transition of Care [facility]) CM/SW Contact  Tully Burgo, Juliann Pulse, RN Phone Number: 10/21/2019, 3:38 PM  Clinical Narrative:       1.     [place]     [phone]           Quality rating       Patient survey rating     Compare    2.     [place]     [phone]           Quality rating       Patient survey rating     Compare    3.     [facility]     [phone]           Quality rating       Patient survey rating     Compare    4.     [place]     [phone]           Quality rating       Patient survey rating     Compare          5.     [place]     [phone]           Quality rating       Patient survey rating     Compare    6.     [place]     [phone]           Quality rating       Patient survey rating     Compare    7.     [facility]     [phone]           Quality rating       Patient survey rating     Compare    8.     [facility]     [phone]           Quality rating       Patient survey rating     Compare    9.     [place]     [phone]           Quality rating       Patient survey rating     Compare    10.     [place]     [phone]           Quality rating       Patient survey rating     Compare    11.     [facility]     [phone]           Quality rating   Not available4    Patient survey  rating   Not available12  Compare    12.     [facility]     [phone]           Quality rating       Patient survey rating     Compare    13.     [facility] [place]     [phone])  [phone]           Quality rating       Patient survey rating     Compare    14.     [facility]     [phone]           Quality rating       Patient survey rating   Not available9  Compare    15.     [facility]     [phone]           Quality rating       Patient survey rating     Compare   <Previous  1 2 All Next>   Data last updated: March 05, 2019  To explore and download home health agency data,visit the data catalog on CMS.gov      Finding a home health agency in your area  Home health agencies provide care in your home, regardless of where their office is located. This list shows the home health agencies that serve your area, based on the location you entered in your search.     Next steps for choosing a home health agency      16.     [place]     [phone]           Quality rating       Patient survey rating     Compare    17.     [place]     [phone]           Quality rating       Patient survey rating     Compare    18.     [facility]     [phone]           Quality rating       Patient survey rating   Not available11  Compare    19.     [facility]     [phone]           Quality rating       Patient survey rating     Compare          20.     [facility]     [phone]           Quality rating       Patient survey rating      Compare   <Previous  1 2 All Next>   Data last updated: March 05, 2019  To explore and download home health agency data,visit the data catalog on CMS.gov      Finding a home health agency in your area  Home health agencies provide care in your home, regardless of where their office is located. This list shows the home health agencies that serve your area, based on the location you entered in your search.     Next steps for choosing a home health agency    Expected Discharge Plan: [place] Barriers to Discharge: No [place] will accept this patient  Expected Discharge Plan and Services Expected Discharge Plan: [place]   Discharge Planning Services: CM Consult   Living arrangements for the past 2 months: Single Family Home Expected Discharge Date:  (unknown)  Social Determinants of Health (SDOH) Interventions    Readmission Risk Interventions No flowsheet data found.

## 2019-10-21 NOTE — Progress Notes (Signed)
Initial Nutrition Assessment  DOCUMENTATION CODES:   Not applicable  INTERVENTION:  D/c Boost Breeze   Ensure Enlive po TID, each supplement provides 350 kcal and 20 grams of protein  Snacks TID  Liberalize diet to regular   Provided education on increasing protein intake.  NUTRITION DIAGNOSIS:   Inadequate oral intake related to poor appetite as evidenced by meal completion < 50%.    GOAL:   Patient will meet greater than or equal to 90% of their needs    MONITOR:   PO intake, Supplement acceptance, Weight trends, Labs, I & O's  REASON FOR ASSESSMENT:   Consult Assessment of nutrition requirement/status  ASSESSMENT:   Pt presented with opacification of L hemithorax w/ recent diagnosis of L malignant pleural effusion 2/2 breast cancer. PMH includes breast cancer, HTN, HLD, T2DM, asthma.  Pt had a pleurx catheter placed on 6/7, but continues to have white out of L hemithorax on CXR.   6/14 - 570ml drained from chest tube after flushing   RN in room at time of visit.  Pt reports appetite is poor. At home, she tries to each 2 slices of toast with eggs and bacon for breakfast. She typically skips lunch. For dinner, she has some type of meat and vegetable, but often finds meat unappetizing. Discussed methods for increasing protein intake while admitted and while at home.  Pt with a 7.9% wt loss x6 months, which is not significant for time frame.   PO Intake: 0-30% x 5 recorded meals (11% average meal intake)  Labs: Corrected Ca 10.32 (H) CBGs 112-176 Medications: Colace, Boost Breeze TID, Novolog, Miralax, Carafate  I/O: +1,434ml since admit Chest tube: 442ml output x24 hours   NUTRITION - FOCUSED PHYSICAL EXAM:    Most Recent Value  Orbital Region No depletion  Upper Arm Region Mild depletion  Thoracic and Lumbar Region No depletion  Buccal Region No depletion  Temple Region No depletion  Clavicle Bone Region No depletion  Clavicle and Acromion Bone  Region No depletion  Scapular Bone Region No depletion  Dorsal Hand No depletion  Patellar Region No depletion  Anterior Thigh Region Mild depletion  Posterior Calf Region Mild depletion  Edema (RD Assessment) None  Hair Reviewed  Eyes Reviewed  Mouth Reviewed  Skin Reviewed  Nails Reviewed       Diet Order:   Diet Order            Diet Heart Room service appropriate? Yes; Fluid consistency: Thin  Diet effective now                 EDUCATION NEEDS:   Education needs have been addressed  Skin:  Skin Assessment: Reviewed RN Assessment  Last BM:  6/13  Height:   Ht Readings from Last 1 Encounters:  10/16/19 5\' 6"  (1.676 m)    Weight:   Wt Readings from Last 10 Encounters:  10/16/19 80.3 kg  10/15/19 79.8 kg  10/09/19 80.4 kg  10/02/19 81.7 kg  09/30/19 82.5 kg  06/23/19 86.1 kg  05/22/19 87.1 kg  05/15/19 87.7 kg  04/17/19 87.4 kg  03/06/19 89.2 kg   BMI:  Body mass index is 28.57 kg/m.  Estimated Nutritional Needs:   Kcal:  1950-2150  Protein:  95-110 grams  Fluid:  >1.95L    Larkin Ina, MS, RD, LDN RD pager number and weekend/on-call pager number located in [place].

## 2019-10-21 NOTE — Progress Notes (Signed)
PT Cancellation Note  Patient Details Name: Autumn Cook MRN: [id number] DOB: 21-Aug-1960   Cancelled Treatment:    Reason Eval/Treat Not Completed: Pain limiting ability to participate (she reported she's hurting too much to do PT at present. Will follow.)  [name] PT 10/21/2019  Acute Rehabilitation Services Pager [phone] Office [phone]

## 2019-10-21 NOTE — TOC Initial Note (Signed)
Transition of Care [facility]) - Initial/Assessment Note    Patient Details  Name: Autumn Cook MRN: [id number] Date of Birth: 1960/11/24  Transition of Care [facility]) CM/SW Contact:    Dessa Phi, RN Phone Number: 10/21/2019, 3:26 PM  Clinical Narrative:  Spoke to patient/dtr about d/c plans-home w/HHRN-pleurx drain changes.Currently no HHC agnecy accepting tried-[facility]                  Expected Discharge Plan: [place] Barriers to Discharge: Continued Medical Work up   Patient Goals and CMS Choice Patient states their goals for this hospitalization and ongoing recovery are:: go home CMS Medicare.gov Compare Post Acute Care list provided to:: Patient Choice offered to / list presented to : Patient  Expected Discharge Plan and Services Expected Discharge Plan: [place]   Discharge Planning Services: CM Consult   Living arrangements for the past 2 months: Single Family Home Expected Discharge Date:  (unknown)                                    Prior Living Arrangements/Services Living arrangements for the past 2 months: Single Family Home Lives with:: Adult Children Patient language and need for interpreter reviewed:: Yes        Need for Family Participation in Patient Care: No (Comment) Care giver support system in place?: Yes (comment) Current home services: DME (pleurx drain cannisters;oxygen-[facility]) Criminal Activity/Legal Involvement Pertinent to Current Situation/Hospitalization: No - Comment as needed  Activities of Daily Living Home Assistive Devices/Equipment: CBG Meter, Oxygen, Other (Comment) (pleurx catheter on left) ADL Screening (condition at time of admission) Patient's cognitive ability adequate to safely complete daily activities?: Yes Is the patient deaf or have difficulty hearing?: No Does the patient have difficulty seeing, even when wearing glasses/contacts?:  No Does the patient have difficulty concentrating, remembering, or making decisions?: No Patient able to express need for assistance with ADLs?: Yes Does the patient have difficulty dressing or bathing?: No Independently performs ADLs?: Yes (appropriate for developmental age) Does the patient have difficulty walking or climbing stairs?: Yes (secondary to shortness of breath) Weakness of Legs: Both Weakness of Arms/Hands: None  Permission Sought/Granted Permission sought to share information with : Case Manager    Share Information with NAME: Case manager           Emotional Assessment              Admission diagnosis:  Acute respiratory failure ([place]) [J96.00] Pleural effusion [J90] Encounter for central line placement [Z45.2] Pleural effusion on left [J90] HCAP (healthcare-associated pneumonia) [J18.9] Recurrent left pleural effusion [J90] Pneumonia of right lower lobe due to infectious organism [J18.9] Patient Active Problem List   Diagnosis Date Noted  . Generalized pain   . General weakness   . Palliative care by specialist   . Acute respiratory failure ([place]) 10/16/2019  . Diabetes mellitus type 2, uncontrolled, with complications ([place]) 67/67/2094  . Acute respiratory failure with hypoxia ([place]) 10/16/2019  . Pneumonia of right lower lobe due to infectious organism 10/16/2019  . Metastases to the liver ([place]) 10/10/2019  . 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 Pleural effusion 10/09/2019  . Type 2 diabetes mellitus with hyperglycemia ([place]) 10/09/2019  . Encounter for central line placement 08/13/2018  . Port-A-Cath in place 08/07/2018  . Family history of lung cancer   . Malignant neoplasm of overlapping sites of  right breast in female, estrogen receptor positive ([place]) 07/23/2018  . Achilles tendonitis, bilateral 06/11/2015  . Chest pain with moderate risk for cardiac etiology 10/13/2014  . Hypercholesterolemia 10/13/2014  . Idiopathic scoliosis 08/18/2014  .  Hypertension 09/11/2012  . Overweight (BMI 25.0-29.9) 09/11/2012  . Migraine headache 09/11/2012  . Hx of thyroid nodule 09/11/2012  . History of tobacco use 09/11/2012   PCP:  Darreld Mclean, MD Pharmacy:   [id number] [place], [place] [address] Phone: [phone] Fax: [phone]  [facility] [place] [place] [address] [place] [address] Phone: [phone] Fax: [phone]     Social Determinants of Health (SDOH) Interventions    Readmission Risk Interventions No flowsheet data found.

## 2019-10-21 NOTE — Progress Notes (Signed)
[place] [phone] [facility] [place] [phone] [facility] [phone] [facility] [phone] [facility] [phone] [facility] [place] [phone]

## 2019-10-21 NOTE — TOC Benefit Eligibility Note (Signed)
Transition of Care [facility]) Benefit Eligibility Note    Patient Details  Name: AYLEEN MCKINSTRY MRN: [id number] Date of Birth: 10/29/1960   Medication/Dose: NA        Prescription Coverage Preferred Pharmacy: NA  Spoke with Person/Company/Phone Number:: Keef/ [employer] [phone] for network provders for [facility]  Co-Pay: NA        Additional Notes: HH    Kerin Salen Phone Number: 10/21/2019, 4:03 PM

## 2019-10-21 NOTE — Progress Notes (Signed)
RN paged NP regarding Pleurx output of 1947mL serosanguinous fluid since 13:00 today.  Vital signs stable at this time.  Spoke with Dr Oletta Darter at [facility] who recommended leaving to continue to drain and may expect up to 3L or more.  H&H ordered, am Hgb was 8.8.

## 2019-10-22 ENCOUNTER — Inpatient Hospital Stay (HOSPITAL_COMMUNITY): Payer: 59

## 2019-10-22 DIAGNOSIS — C50911 Malignant neoplasm of unspecified site of right female breast: Secondary | ICD-10-CM

## 2019-10-22 DIAGNOSIS — C787 Secondary malignant neoplasm of liver and intrahepatic bile duct: Secondary | ICD-10-CM

## 2019-10-22 LAB — BASIC METABOLIC PANEL
Anion gap: 10 (ref 5–15)
BUN: 31 mg/dL — ABNORMAL HIGH (ref 6–20)
CO2: 25 mmol/L (ref 22–32)
Calcium: 9 mg/dL (ref 8.9–10.3)
Chloride: 106 mmol/L (ref 98–111)
Creatinine, Ser: 0.98 mg/dL (ref 0.44–1.00)
GFR calc Af Amer: >60 mL/min (ref >60)
GFR calc non Af Amer: >60 mL/min (ref >60)
Glucose, Bld: 100 mg/dL — ABNORMAL HIGH (ref 70–99)
Potassium: 4.3 mmol/L (ref 3.5–5.1)
Sodium: 141 mmol/L (ref 135–145)

## 2019-10-22 LAB — CBC WITH DIFFERENTIAL/PLATELET
Abs Immature Granulocytes: 0.07 10*3/uL (ref 0.00–0.07)
Basophils Absolute: 0 10*3/uL (ref 0.0–0.1)
Basophils Relative: 0 %
Eosinophils Absolute: 0.2 10*3/uL (ref 0.0–0.5)
Eosinophils Relative: 2 %
HCT: 28.1 % — ABNORMAL LOW (ref 36.0–46.0)
Hemoglobin: 8.9 g/dL — ABNORMAL LOW (ref 12.0–15.0)
Immature Granulocytes: 1 %
Lymphocytes Relative: 13 %
Lymphs Abs: 0.9 10*3/uL (ref 0.7–4.0)
MCH: 28.9 pg (ref 26.0–34.0)
MCHC: 31.7 g/dL (ref 30.0–36.0)
MCV: 91.2 fL (ref 80.0–100.0)
Monocytes Absolute: 0.6 10*3/uL (ref 0.1–1.0)
Monocytes Relative: 8 %
Neutro Abs: 5.6 10*3/uL (ref 1.7–7.7)
Neutrophils Relative %: 76 %
Platelets: 390 10*3/uL (ref 150–400)
RBC: 3.08 MIL/uL — ABNORMAL LOW (ref 3.87–5.11)
RDW: 13.8 % (ref 11.5–15.5)
WBC: 7.4 10*3/uL (ref 4.0–10.5)
nRBC: 0 % (ref 0.0–0.2)

## 2019-10-22 LAB — GLUCOSE, CAPILLARY
Glucose-Capillary: 114 mg/dL — ABNORMAL HIGH (ref 70–99)
Glucose-Capillary: 195 mg/dL — ABNORMAL HIGH (ref 70–99)
Glucose-Capillary: 101 mg/dL — ABNORMAL HIGH (ref 70–99)
Glucose-Capillary: 132 mg/dL — ABNORMAL HIGH (ref 70–99)
Glucose-Capillary: 196 mg/dL — ABNORMAL HIGH (ref 70–99)

## 2019-10-22 LAB — CULTURE, BLOOD (ROUTINE X 2)
Culture: NO GROWTH
Special Requests: ADEQUATE

## 2019-10-22 LAB — MAGNESIUM: Magnesium: 1.9 mg/dL (ref 1.7–2.4)

## 2019-10-22 MED ORDER — HYDROMORPHONE HCL 2 MG PO TABS
2.0000 mg | ORAL_TABLET | ORAL | Status: DC | PRN
  Administered 2019-10-23 – 2019-10-24 (×2): 2 mg via ORAL
  Administered 2019-10-25: 4 mg via ORAL
  Administered 2019-10-26: 2 mg via ORAL
  Filled 2019-10-22 (×3): qty 1
  Filled 2019-10-22: qty 2

## 2019-10-22 MED ORDER — LACTULOSE 10 GM/15ML PO SOLN
10.0000 g | Freq: Every day | ORAL | Status: AC
  Administered 2019-10-22 – 2019-10-23 (×2): 10 g via ORAL
  Filled 2019-10-22 (×2): qty 15

## 2019-10-22 NOTE — Progress Notes (Signed)
Autumn Cook   DOB:1961/03/04   FO#:[id number]   MVE#:[id number]  Subjective:  Autumn Cook tells me she is feeling better; no BM in 72+ hrs; drainage was too painful and she refused to have it reconnected. She is waiting on palliative care to help with the pain issue so she can consider further drainage; daughter Autumn Cook in room  Objective: African American woman examined in bed Vitals:   10/22/19 0449 10/22/19 0802  BP: 103/69   Pulse: 71 74  Resp:  14  Temp: 98.2 F (36.8 C)   SpO2: 100% 99%    Body mass index is 28.57 kg/m.  Intake/Output Summary (Last 24 hours) at 10/22/2019 0838 Last data filed at 10/22/2019 0451 Gross per 24 hour  Intake 850 ml  Output 1350 ml  Net -500 ml      CBG (last 3)  Recent Labs    10/21/19 2341 10/22/19 0435 10/22/19 0744  GLUCAP 105* 114* 195*     Labs:  Lab Results  Component Value Date   WBC 7.4 10/22/2019   HGB 8.9 (L) 10/22/2019   HCT 28.1 (L) 10/22/2019   MCV 91.2 10/22/2019   PLT 390 10/22/2019   NEUTROABS 5.6 10/22/2019    @LASTCHEMISTRY @  Urine Studies No results for input(s): UHGB, CRYS in the last 72 hours.  Invalid input(s): UACOL, UAPR, USPG, UPH, UTP, UGL, UKET, UBIL, UNIT, UROB, ULEU, UEPI, UWBC, URBC, UBAC, [place], [place], [place]  Basic Metabolic Panel: Recent Labs  Lab 10/17/19 0519 10/17/19 0519 10/18/19 2836 10/18/19 6294 10/19/19 0352 10/19/19 0352 10/20/19 0405 10/21/19 0504 10/22/19 0342  NA 137  --  139  --  139  --  140 139  --   K 3.9   < > 4.1   < > 4.9   < > 4.4 4.0  --   CL 102  --  104  --  104  --  104 103  --   CO2 21*  --  24  --  27  --  26 26  --   GLUCOSE 166*  --  144*  --  141*  --  119* 113*  --   BUN 17  --  17  --  17  --  17 24*  --   CREATININE 0.67  --  0.62  --  0.78  --  0.68 0.70  --   CALCIUM 9.0  --  8.9  --  9.2  --  9.3 9.2  --   MG 1.6*   < > 1.9  --  1.8  --  1.7 2.1 1.9  PHOS 3.0  --   --   --   --   --   --   --   --    < > = values in this interval not displayed.    GFR Estimated Creatinine Clearance: 82.9 mL/min (by C-G formula based on SCr of 0.7 mg/dL). Liver Function Tests: Recent Labs  Lab 10/17/19 0519 10/18/19 0338 10/19/19 0352 10/20/19 0405 10/21/19 0504  AST 27 32 32 41 45*  ALT 16 22 20 23 23   ALKPHOS 49 51 56 58 58  BILITOT 0.4 0.3 0.5 0.5 0.5  PROT 5.9* 5.7* 6.2* 6.1* 6.0*  ALBUMIN 2.6* 2.4* 2.6* 2.6* 2.6*   No results for input(s): LIPASE, AMYLASE in the last 168 hours. No results for input(s): AMMONIA in the last 168 hours. Coagulation profile No results for input(s): INR, PROTIME in the last 168 hours.  CBC: Recent Labs  Lab 10/18/19 0338 10/18/19 0338 10/19/19 0352 10/20/19 0405 10/21/19 0504 10/21/19 2010 10/22/19 0342  WBC 7.4  --  8.2 7.8 6.0  --  7.4  NEUTROABS 5.9  --  5.9 5.9 4.3  --  5.6  HGB 9.5*   < > 9.6* 9.4* 8.8* 9.3* 8.9*  HCT 29.7*   < > 29.5* 29.6* 27.4* 28.9* 28.1*  MCV 89.5  --  89.9 91.1 91.0  --  91.2  PLT 389  --  384 378 372  --  390   < > = values in this interval not displayed.   Cardiac Enzymes: No results for input(s): CKTOTAL, CKMB, CKMBINDEX, TROPONINI in the last 168 hours. BNP: Invalid input(s): POCBNP CBG: Recent Labs  Lab 10/21/19 1627 10/21/19 2050 10/21/19 2341 10/22/19 0435 10/22/19 0744  GLUCAP 105* 163* 105* 114* 195*   D-Dimer No results for input(s): DDIMER in the last 72 hours. Hgb A1c No results for input(s): HGBA1C in the last 72 hours. Lipid Profile No results for input(s): CHOL, HDL, LDLCALC, TRIG, CHOLHDL, LDLDIRECT in the last 72 hours. Thyroid function studies No results for input(s): TSH, T4TOTAL, T3FREE, THYROIDAB in the last 72 hours.  Invalid input(s): FREET3 Anemia work up No results for input(s): VITAMINB12, FOLATE, FERRITIN, TIBC, IRON, RETICCTPCT in the last 72 hours. Microbiology Recent Results (from the past 240 hour(s))  MRSA PCR Screening     Status: None   Collection Time: 10/13/19  9:37 AM   Specimen: Nasal Mucosa; Nasopharyngeal   Result Value Ref Range Status   MRSA by PCR NEGATIVE NEGATIVE Final    Comment:        The GeneXpert MRSA Assay (FDA approved for NASAL specimens only), is one component of a comprehensive MRSA colonization surveillance program. It is not intended to diagnose MRSA infection nor to guide or monitor treatment for MRSA infections. Performed at [facility], [place] [address], [place], [place] [address]   SARS Coronavirus 2 by RT PCR (hospital order, performed in [facility] hospital lab) Nasopharyngeal Nasopharyngeal Swab     Status: None   Collection Time: 10/16/19 12:43 AM   Specimen: Nasopharyngeal Swab  Result Value Ref Range Status   SARS Coronavirus 2 NEGATIVE NEGATIVE Final    Comment: (NOTE) SARS-CoV-2 target nucleic acids are NOT DETECTED.  The SARS-CoV-2 RNA is generally detectable in upper and lower respiratory specimens during the acute phase of infection. The lowest concentration of SARS-CoV-2 viral copies this assay can detect is 250 copies / mL. A negative result does not preclude SARS-CoV-2 infection and should not be used as the sole basis for treatment or other patient management decisions.  A negative result may occur with improper specimen collection / handling, submission of specimen other than nasopharyngeal swab, presence of viral mutation(s) within the areas targeted by this assay, and inadequate number of viral copies (<250 copies / mL). A negative result must be combined with clinical observations, patient history, and epidemiological information.  Fact Sheet for Patients:   StrictlyIdeas.no  Fact Sheet for Healthcare Providers: BankingDealers.co.za  This test is not yet approved or  cleared by the [place] FDA and has been authorized for detection and/or diagnosis of SARS-CoV-2 by FDA under an Emergency Use Authorization (EUA).  This EUA will remain in effect (meaning this test can  be used) for the duration of the COVID-19 declaration under Section 564(b)(1) of the Act, 21 U.S.C. section 360bbb-3(b)(1), unless the authorization is terminated or revoked sooner.  Performed at [facility], [address] [place], [place] [address]   Blood culture (routine x 2)     Status: None   Collection Time: 10/16/19  5:20 AM   Specimen: BLOOD LEFT ARM  Result Value Ref Range Status   Specimen Description   Final    BLOOD LEFT ARM Performed at [place] [address], [place], [place] [address]    Special Requests   Final    BOTTLES DRAWN AEROBIC AND ANAEROBIC Blood Culture results may not be optimal due to an inadequate volume of blood received in culture bottles Performed at [place] [address] [place], [place] [address]    Culture   Final    NO GROWTH 5 DAYS Performed at [facility], [place] [address] [place], [place] [address]    Report Status 10/21/2019 FINAL  Final  Body fluid culture (includes gram stain)     Status: None   Collection Time: 10/16/19  7:48 AM   Specimen: Pleural Fluid  Result Value Ref Range Status   Specimen Description   Final    PLEURAL Performed at [place] [address] [place], [place] [address]    Special Requests   Final    NONE Performed at [facility], [place] [address] [place], [place] [address]    Gram Stain   Final    RARE WBC PRESENT, PREDOMINANTLY MONONUCLEAR NO ORGANISMS SEEN    Culture   Final    NO GROWTH 3 DAYS Performed at [facility], [place] [address] [place], [place] [address]    Report Status 10/19/2019 FINAL  Final  Fungus Culture With Stain     Status: None (Preliminary result)   Collection Time: 10/16/19  7:48 AM   Specimen: Pleural Fluid  Result Value Ref Range Status   Fungus Stain Final report  Final    Comment: (NOTE) Performed At: [facility] [address] [place], [place]  [id number] Rush Farmer MD JG:[id number]    Fungus (Mycology) Culture PENDING  Incomplete   Fungal Source PLEURAL  Final    Comment: Performed at [facility], [place] [address] [place], [place] [address]  Fungus Culture Result     Status: None   Collection Time: 10/16/19  7:48 AM  Result Value Ref Range Status   Result 1 Comment  Final    Comment: (NOTE) KOH/Calcofluor preparation:  no fungus observed. Performed At: [facility] [place], [place] [id number] Rush Farmer MD XN:[id number]   Blood culture (routine x 2)     Status: None (Preliminary result)   Collection Time: 10/16/19  9:42 PM   Specimen: BLOOD  Result Value Ref Range Status   Specimen Description   Final    BLOOD LEFT HAND Performed at [facility], [place] [address], [place], [place] [address]    Special Requests   Final    BOTTLES DRAWN AEROBIC ONLY Blood Culture adequate volume Performed at [place] [address] [place], [place] [address]    Culture   Final    NO GROWTH 4 DAYS Performed at [facility], [place] [address] [place],  [address]    Report Status PENDING  Incomplete      Studies:  CT CHEST WO CONTRAST  Result Date: 10/20/2019 CLINICAL DATA:  Abnormal chest radiograph, pleural effusion. History of breast cancer. Increasing shortness of breath and left-sided chest  pain. No drainage from pleural catheter. EXAM: CT CHEST WITHOUT CONTRAST TECHNIQUE: Multidetector CT imaging of the chest was performed following the standard protocol without IV contrast. COMPARISON:  CT abdomen pelvis 09/30/2019.  MR abdomen 10/04/2019. FINDINGS: Cardiovascular: Left IJ central line terminates in the upper SVC. Heart is mildly enlarged. There may be a small pericardial effusion and/or pericardial thickening. Mediastinum/Nodes: Low-attenuation thyroid nodules measure up to 1.7 cm on the right No follow-up recommended unless  clinically warranted (ref: J Am Coll Radiol. 2015 Feb;12(2): 143-50).Mediastinal lymph nodes measure up to 8 mm in the prevascular space. Hilar regions are difficult to evaluate without IV contrast. Left axillary lymph nodes and soft tissue stranding with the largest individual lymph node measuring 9 mm in short axis (2/30). No right axillary adenopathy. Prepericardiac/left juxtadiaphragmatic lymph nodes measure up to 11 mm (2/108). Lungs/Pleura: Pleural fluid occupies the entire left hemithorax. There is extensive associated irregular pleural thickening and left lung collapse. A chest tube terminates in the posterior left hemithorax. Heterogeneity at the base of the left hemithorax may represent a combination of atelectatic lung, complex fluid and/or pleural thickening (2/95). Difficult to exclude an underlying mass. Obstruction/narrowing of the left upper and left lower lobe bronchi. Rightward mass effect on the heart and mediastinum. Patchy ground-glass in the apical segment right upper lobe. 5 mm right lower lobe nodule (5/84). Upper Abdomen: Well-circumscribed low-attenuation lesion in the periphery of the liver measures at least 3.8 cm, incompletely imaged but similar to 09/30/2019. Additional vague areas of low-attenuation are seen in the liver and were better seen on 10/04/2019. Visualized portions of the liver, adrenal glands, kidneys, spleen, stomach and bowel are otherwise unremarkable. Musculoskeletal: No definite worrisome lytic or sclerotic lesions. IMPRESSION: 1. Complex left pleural effusion occupies the entire left hemithorax, with extensive associated nodular pleural thickening, consistent with metastatic disease. Findings are progressive from 09/30/2019. Left chest tube is in place. Given the complexity of the findings, it is unlikely that much drainage will be seen. 2. Inferior right breast mass, as on 09/30/2019. Hepatic metastatic disease, better seen on 10/04/2019. 3. Borderline enlarged  mediastinal and prepericardiac/left juxtadiaphragmatic lymph nodes. 4. Right lower lobe pulmonary nodule, indeterminate. 5. Patchy ground-glass in the apical segment right upper lobe may be infectious or inflammatory in etiology. 6. Small pericardial effusion/thickening. Electronically Signed   By: Lorin Picket M.D.   On: 10/20/2019 15:20   DG Chest Port 1 View  Result Date: 10/22/2019 CLINICAL DATA:  Pleural effusion. EXAM: PORTABLE CHEST 1 VIEW COMPARISON:  10/19/2019 FINDINGS: Left IJ catheter tip projects over the SVC. The left pleural effusion has decreased in volume compared with the previous exam. There is improved aeration to the left upper lobe and left midlung. The large left upper lobe lung mass is again seen. IMPRESSION: 1. Decrease in volume of left pleural effusion and improved aeration to the left upper lobe and left midlung. 2. Stable left upper lobe lung mass. Electronically Signed   By: Kerby Moors M.D.   On: 10/22/2019 08:32    Assessment: 59 y.o. Autumn Cook, [place] woman status post right breast biopsy x2 on 07/10/2018 for multicentric invasive ductal carcinoma, clinically T1c N1, stage IIB, grade 3, functionally triple negative, with an MIB-1 of 70% (a) right axillary lymph node biopsied at the same time was positive  (1) neoadjuvant chemotherapy consisting of doxorubicin and cyclophosphamide in dose dense fashion x4 started 08/07/2018, completed 09/16/2018, followed by weekly Abraxane and carboplatin x12 starting 08/07/2018, stopped after 2 cycles (last dose on  10/15/2018) due to peripheral neuropathy   (2) Right breast lumpectomy on 12/27/2018 shows a ypT1b ypN0 residual invasive ductal carcinoma, margins negative. (a) 5 sentinel nodes were negative.  (b) Estrogen receptor 75% positive, weak, Progesterone receptor negative.  (3) adjuvant radiation  Radiation Treatment Dates: 02/18/2019 through 04/02/2019 Site Technique Total Dose (Gy)  Dose per Fx (Gy) Completed Fx Beam Energies  Breast: Breast_Rt 3D 50.4/50.4 1.8 28/28 6X, 10X  Breast: Breast_Rt_axilla 3D 50.4/50.4 1.8 28/28 6X, 10X  Breast: Breast_Rt_Bst 3D 20/20 2 10/10 6X, 10X    (4) genetics testing 08/06/2018 through the Breast + GYN Cancers Panel offered by [facility] found no deleterious mutationsin ATM, BARD1, BRCA1, BRCA2, BRIP1, CDH1, CHEK2, DICER1, EPCAM, MLH1, MSH2, MSH6, NBN, NF1, PALB2, PMS2, PTEN, RAD50, RAD51C, RAD51D, SMARCA4, STK11, TP53.   (5) anastrozole started 05/14/2018 (a) bone density 09/22/2019 shows a T score of -0.8 (normal).  METASTATIC DISEASE: June 2021 (6)CT renalstonestudy 09/30/2019 to evaluate left flank pain shows left pleural effusion and possible liver lesions (a)cytology from the left pleural effusion 10/03/2019 confirms metastatic disease (b)liver MRI 10/04/2019 shows multiple liver lesions (c) prognostic panel requested 10/10/2019 on cytology from 10/03/2019 flui  (7) left effusion: left pleurx placement 10/13/2019  (a) loculated? Consider chest tube/pleurodesis  (8) pain: currently on baseline fentanyl 25 mcg/h patch  (a) naproxen/acetaminophen TID   (b) morphine PRN-- not well tolerated  (c) bowel prophylaxis--no response to miralax/colace so far   Plan:  My hope is Amarilys can get her R lung drained enough that pleurodesis might be possible. If she cannot tolerate drainage, we may end up with a permanently drowned lung. I discussed this with the patient and extensively with her daughter, who is trying to understand what is going to be happening when patient returns home--how much help she will be needing.  We do have the capecitabine tablets on hand. We will notstart until patient's situation is stable.  Will add   Chauncey Cruel, MD 10/22/2019  8:38 AM [facility] [address] [place], [place] [address] Tel.  [phone]    Fax. [phone]

## 2019-10-22 NOTE — Progress Notes (Signed)
Daily Progress Note   Patient Name: Autumn Cook       Date: 10/22/2019 DOB: 1960-09-20  Age: 59 y.o. MRN#: [id number] Attending Physician: Elodia Florence., * Primary Care Physician: Darreld Mclean, MD Admit Date: 10/16/2019  Reason for Consultation/Follow-up: Non pain symptom management and Pain control  Subjective: I saw and examined Autumn Cook today.  One of her daughters was present at the bedside as well.  Continues to report that overall her pain is better controlled, but she still has unbearable incident pain related to any manipulation of her drain.  She feels that the oral morphine that we tried yesterday made her very nauseous.  Discussed options for rescue medications moving forward that are oral formulation including hydrocodone, oxycodone, morphine, and Dilaudid.  She reports that oxycodone "completely tears up my stomach my stomachmy stomach."  She still felt nauseous following oral morphine, but reports better than oxycodone.    We discussed that each person may react differently to individual opioids in regards to side effects.  Also discussed that oftentimes nausea is one of the side effects that opioids it does improve with continued usage.    Following conversation with her, we will plan for a trial of Dilaudid by mouth to see if it provides adequate breakthrough pain control while also seeing if it causes less nausea than currently ordered oral morphine.  I also discussed with her pretreating prior to any manipulation of the tube and if current order dose is not high enough, we may need to consider giving her a double dose of medication prior to any drainage at home.  We also discussed again regarding the use of both long and short acting medications and working to  titrate these to create the best round-the-clock analgesic effect.  Currently, she states that her pain is controlled with the fentanyl patch as long as drain is not being manipulated.  We will therefore continue with currently ordered fentanyl patch at 25 mcg/h.   Reviewed her rescue medications over the last 24 hours and this included total oral morphine equivalent of 47mg  of oral morphine.  Length of Stay: 6  Current Medications: Scheduled Meds:  . acetaminophen  500 mg Oral TID WC  . atenolol  100 mg Oral Daily  . Chlorhexidine Gluconate Cloth  6 each Topical  Daily  . docusate sodium  200 mg Oral Daily  . pulmozyme (DORNASE) for intrapleural administration  5 mg Intrapleural Once  . enoxaparin (LOVENOX) injection  40 mg Subcutaneous Q24H  . feeding supplement (ENSURE ENLIVE)  237 mL Oral TID BM  . fentaNYL  1 patch Transdermal Q72H  . insulin aspart  0-15 Units Subcutaneous Q4H  . lactulose  10 g Oral Daily  . levalbuterol  1.25 mg Nebulization BID  . mouth rinse  15 mL Mouth Rinse BID  . naproxen  250 mg Oral TID WC  . polyethylene glycol  17 g Oral Daily  . sodium chloride flush  10-40 mL Intracatheter Q12H  . sucralfate  1 g Oral TID WC & HS    Continuous Infusions:   PRN Meds: HYDROmorphone, metoprolol tartrate, morphine injection, ondansetron (ZOFRAN) IV, sodium chloride flush  Physical Exam     General: Alert, awake, in no acute distress.  HEENT: No goiter, no JVD Heart: Regular rate and rhythm.  Lungs: Fair air movement, chest tube in place.  Abdomen: Soft, nontender, nondistended, positive bowel sounds.  Ext: No significant edema Skin: Warm and dry Neuro: Grossly intact, nonfocal.      Vital Signs: BP 116/69 (BP Location: Left Arm)   Pulse 81   Temp 99.2 F (37.3 C) (Oral)   Resp 16   Ht 5\' 6"  (1.676 m)   Wt 80.3 kg   LMP 11/14/2013   SpO2 92%   BMI 28.57 kg/m  SpO2: SpO2: 92 % O2 Device: O2 Device: Room Air O2 Flow Rate: O2 Flow Rate (L/min): 2  L/min  Intake/output summary:   Intake/Output Summary (Last 24 hours) at 10/22/2019 1302 Last data filed at 10/22/2019 0451 Gross per 24 hour  Intake 610 ml  Output 1350 ml  Net -740 ml   LBM: Last BM Date: 10/20/19 Baseline Weight: Weight: 80.3 kg Most recent weight: Weight: 80.3 kg       Palliative Assessment/Data:      Patient Active Problem List   Diagnosis Date Noted  . Generalized pain   . General weakness   . Palliative care by specialist   . Acute respiratory failure ([place]) 10/16/2019  . Diabetes mellitus type 2, uncontrolled, with complications ([place]) 16/11/3708  . Acute respiratory failure with hypoxia ([place]) 10/16/2019  . Pneumonia of right lower lobe due to infectious organism 10/16/2019  . Metastases to the liver ([place]) 10/10/2019  . 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 Pleural effusion 10/09/2019  . Type 2 diabetes mellitus with hyperglycemia ([place]) 10/09/2019  . Encounter for central line placement 08/13/2018  . Port-A-Cath in place 08/07/2018  . Family history of lung cancer   . Malignant neoplasm of overlapping sites of right breast in female, estrogen receptor positive ([place]) 07/23/2018  . Achilles tendonitis, bilateral 06/11/2015  . Chest pain with moderate risk for cardiac etiology 10/13/2014  . Hypercholesterolemia 10/13/2014  . Idiopathic scoliosis 08/18/2014  . Hypertension 09/11/2012  . Overweight (BMI 25.0-29.9) 09/11/2012  . Migraine headache 09/11/2012  . Hx of thyroid nodule 09/11/2012  . History of tobacco use 09/11/2012    Palliative Care Assessment & Plan   Patient Profile: 59 year old female with metastatic breast cancer under the care of [name].  She is undergoing chemotherapy and radiation and is status post Pleurx catheter placement with cytology revealing malignant cells.  Currently admitted for discomfort and shortness of breath and pulmonary working to increase drainage from Pleurx catheter.  Plan on discharge she is for  follow-up  with [name] for further treatment with capecitabine.  Palliative following for pain management.  Recommendations/Plan: -Pain:  Recommend continue fentanyl patch 25 mcg/h.  Trial Dilaudid 2 to 4 mg p.o. every 3 hours as needed for breakthrough pain to see if it causes less nausea than oral morphine.  I also continued order for as needed IV morphine to be used as a second line pain medication 45 minutes after oral medication if oral medication is ineffective to relieve her pain. -Constipation: Opioid related.  Continue Miralax.  Reports feeling urge to go coming on.  If continues to have constipation, would titrate bowel regimen with addition of senna 2 tabs twice daily.  Goals of Care and Additional Recommendations:  Limitations on Scope of Treatment: Full Scope Treatment  Code Status:    Code Status Orders  (From admission, onward)         Start     Ordered   10/16/19 0803  Full code  Continuous        10/16/19 0808        Code Status History    Date Active Date Inactive Code Status Order ID Comments User Context   10/09/2019 2245 10/14/2019 2035 Full Code [id number]  Rise Patience, MD Inpatient   Advance Care Planning Activity       Prognosis:   Unable to determine  Discharge Planning:  Home with Energy was discussed with patient, daughter, RN  Thank you for allowing the Palliative Medicine Team to assist in the care of this patient.   Time In: 1220 Time Out: 1250 Total Time 30 Prolonged Time Billed No   Greater than 50%  of this time was spent counseling and coordinating care related to the above assessment and plan.  Micheline Rough, MD  Please contact Palliative Medicine Team phone at [phone] for questions and concerns.

## 2019-10-22 NOTE — Evaluation (Signed)
Physical Therapy Evaluation Patient Details Name: Autumn Cook MRN: [id number] DOB: 28-Nov-1960 Today's Date: 10/22/2019   History of Present Illness  59 yo female former smoker with metastatic breast cancer complicated by malignant pleural effusion.  She had pleurx catheter placed on 10/14/19  Clinical Impression  Pt admitted with above diagnosis.  Pt currently with functional limitations due to the deficits listed below (see PT Problem List). Pt will benefit from skilled PT to increase their independence and safety with mobility to allow discharge to the venue listed below.  Pt reports pain better however fatigues very quickly.  Pt ambulate to/from bathroom with daughter assisting.  Discussed equipment options at home with daughter such as RW, [facility], toilet riser, hospital bed and daughter considering.  Pt initially agreeable to ambulate in hallway after using bathroom however too fatigued and requested return to bed.     Follow Up Recommendations Supervision for mobility/OOB;Home health PT    Equipment Recommendations  Rolling walker with 5" wheels;3in1 (PT)    Recommendations for Other Services       Precautions / Restrictions Precautions Precautions: Fall Precaution Comments: pleurx catheter      Mobility  Bed Mobility Overal bed mobility: Needs Assistance Bed Mobility: Supine to Sit;Sit to Supine     Supine to sit: Supervision;HOB elevated Sit to supine: Supervision;HOB elevated   General bed mobility comments: extra time  Transfers Overall transfer level: Needs assistance Equipment used: None Transfers: Sit to/from Stand Sit to Stand: Min guard         General transfer comment: min/guard for safety provided by daughter  Ambulation/Gait Ambulation/Gait assistance: Min guard Gait Distance (Feet): 16 Feet Assistive device: None Gait Pattern/deviations: Step-through pattern;Decreased stride length     General Gait Details: pt ambulated with daughter to/from  bathroom; too fatigued after using bathroom to ambulate in hallway  Stairs            Wheelchair Mobility    Modified Rankin (Stroke Patients Only)       Balance Overall balance assessment: Needs assistance         Standing balance support: No upper extremity supported Standing balance-Leahy Scale: Good                               Pertinent Vitals/Pain Pain Assessment: Faces Faces Pain Scale: Hurts little more Pain Descriptors / Indicators: Discomfort;Grimacing Pain Intervention(s): Repositioned;Monitored during session;Other (comment) (pt reports pain improved since earlier)    Home Living Family/patient expects to be discharged to:: Private residence Living Arrangements: Spouse/significant other;Other relatives Available Help at Discharge: Family Type of Home: House Home Access: Stairs to enter Entrance Stairs-Rails: None Entrance Stairs-Number of Steps: 3-4 Home Layout: One level Home Equipment: None      Prior Function Level of Independence: Independent               Hand Dominance        Extremity/Trunk Assessment   Upper Extremity Assessment Upper Extremity Assessment: Generalized weakness    Lower Extremity Assessment Lower Extremity Assessment: Generalized weakness    Cervical / Trunk Assessment Cervical / Trunk Assessment: Normal  Communication   Communication: No difficulties  Cognition Arousal/Alertness: Awake/alert Behavior During Therapy: WFL for tasks assessed/performed Overall Cognitive Status: Within Functional Limits for tasks assessed  General Comments      Exercises     Assessment/Plan    PT Assessment Patient needs continued PT services  PT Problem List Decreased strength;Decreased mobility;Decreased activity tolerance;Decreased safety awareness;Pain;Decreased knowledge of use of DME       PT Treatment Interventions DME  instruction;Therapeutic activities;Gait training;Therapeutic exercise;Functional mobility training;Stair training;Patient/family education    PT Goals (Current goals can be found in the Care Plan section)  Acute Rehab PT Goals PT Goal Formulation: With patient/family Time For Goal Achievement: 11/05/19 Potential to Achieve Goals: Good    Frequency Min 3X/week   Barriers to discharge        Co-evaluation               AM-PAC PT "6 Clicks" Mobility  Outcome Measure Help needed turning from your back to your side while in a flat bed without using bedrails?: A Little Help needed moving from lying on your back to sitting on the side of a flat bed without using bedrails?: A Little Help needed moving to and from a bed to a chair (including a wheelchair)?: A Little Help needed standing up from a chair using your arms (e.g., wheelchair or bedside chair)?: A Little Help needed to walk in hospital room?: A Little Help needed climbing 3-5 steps with a railing? : A Little 6 Click Score: 18    End of Session   Activity Tolerance: Patient limited by fatigue Patient left: in bed;with call bell/phone within reach;with family/visitor present   PT Visit Diagnosis: Difficulty in walking, not elsewhere classified (R26.2)    Time: 0211-1735 PT Time Calculation (min) (ACUTE ONLY): 17 min   Charges:   PT Evaluation $PT Eval Low Complexity: 1 Low        Kati PT, DPT Acute Rehabilitation Services Pager: [phone] Office: [phone]  Trena Platt 10/22/2019, 2:39 PM

## 2019-10-22 NOTE — Progress Notes (Addendum)
NAME:  Autumn Cook, MRN:  [id number], DOB:  [birth year], LOS: 6 ADMISSION DATE:  10/16/2019, CONSULTATION DATE:  10/20/2019 REFERRING MD:  Dr. Barth Kirks, CHIEF COMPLAINT:  Pleural effusion   Brief History   59 yo female former smoker with metastatic breast cancer complicated by malignant pleural effusion.  She had pleurx catheter placed on 10/14/19, but continues to have white out of Lt hemithorax on chest xray.  PCCM asked to assess.  History of present illness   59 yo female diagnosed with multicentric invasive ductal carcinoma after having abnormal mammogram on 07/01/18.  She was found to have Rt axillary nodal involvement.  She had neoadjuvant chemotherapy with doxorubicin and cyclophosphamide from 08/07/18 to 09/06/18.  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  She had adjuvant radiation therapy to Rt breast and axilla from 1013/20 to 04/02/19.  She was started on anastrozole on 05/14/18.  She was seen by oncology on 09/30/19 and noted to have Lt flank pain, dry cough, fatigue, and chest tightness.  She was treated for possible respiratory infection and asthma exacerbation.  She had renal stone CT study.  Found to have large Lt pleural effusion and 2 hypoattenuating lesions on her liver.  She had Lt thoracentesis by IR on 10/03/19 with 950 ml fluid removed.  Fluid analysis showed malignant cells on cytology review.  Her breathing continued to get worse.  She went to hospital on 10/09/19.  She had pleurx catheter placed on 10/13/19.  She was noted to have 1800 ml of fluid removed.  However, CXR didn't show significant change.  She was discharged home on 10/14/19.    She returned to hospital on 10/16/19.  She was still feeling short of breath and had sharp chest pain.  CXR showed white out of Lt hemithorax.  Staff unable to drain fluid from pleurx catheter.  She wasn't able to get supplies at home to drain catheter before she had to come back to  ER.  Pain symptoms better since her pain medications adjusted.  Still short of breath with activity and now needing supplemental oxygen.  Not having sputum, fever, or hemoptysis.  Past Medical History  Allergies, Asthma, DM, HTN, Migraine headache  Significant Hospital Events   6/10 Admit 6/14 L PleurX flushed with 60 ml saline  Consults:  Oncology  Procedures:  Lt IJ CVL 6/10 >>   Significant Diagnostic Tests:  Lt thoracentesis 10/03/19 >> protein 5.4, LDH 1473, WBC 3118 (46% lymphocytes, 34% macrophages, 20% neutrophils), cytology positive for malignant cells from breast primary  Micro Data:  Lt pleural fluid 6/10 >>  Blood 6/10 >>   Antimicrobials:  Vancomycin 6/09 Cefepime 6/09 >> 6/11  Interim history/subjective:  CT drained 1.3L in last 24 hours  Afebrile  Pt reports chest tube was clamped yesterday and she has had much less pain since that time.   Objective   Blood pressure 103/69, pulse 74, temperature 98.2 F (36.8 C), temperature source Oral, resp. rate 14, height 5\' 6"  (1.676 m), weight 80.3 kg, last menstrual period 11/14/2013, SpO2 99 %.        Intake/Output Summary (Last 24 hours) at 10/22/2019 0849 Last data filed at 10/22/2019 0451 Gross per 24 hour  Intake 850 ml  Output 1350 ml  Net -500 ml   Filed Weights   10/16/19 0431  Weight: 80.3 kg    Examination: General: adult female lying in bed in NAD  HEENT: MM pink/moist, no  jvd, good dentition, wearing glasses  Neuro: AAOx4, speech clear, MAE  CV: s1s2 rrr, no m/r/g PULM: non-labored, clear on right, diminished on left, left pleurX intact  GI: soft, bsx4 active  Extremities: warm/dry, no edema  Skin: no rashes or lesions  CXR 6/16 >> images personally reviewed, decreased effusion on left with improved aeration to LUL, LML, residual left lower opacity (? Combination of effusion & atelectasis / trapped lung), mild right shift  Assessment & Plan:   Opacification of Lt hemithorax with recent  diagnosis of Lt malignant pleural effusion 2nd to breast cancer based on cytology results from 10/03/19. S/P pleurX catheter placement  -follow intermittent CXR  -take chest tube off [place] drainage and clamp, will return to 2x weekly drainage for now.  May need to increase schedule pending symptom burden  -suspect she has an element of trapped lung will not re-expand, this will prevent her from having pleurodesis  -pain control per Palliative Care -will need to ensure she has the appropriate support for drainage of pleurX at home > she will need home health nursing, pleurX drainage supplies  -appreciate palliative care assistance   Asthma -continue xopenex  Hypoxia. -O2 to support sats >90%  Labs   CBC: Recent Labs  Lab 10/18/19 0338 10/18/19 0338 10/19/19 0352 10/20/19 0405 10/21/19 0504 10/21/19 2010 10/22/19 0342  WBC 7.4  --  8.2 7.8 6.0  --  7.4  NEUTROABS 5.9  --  5.9 5.9 4.3  --  5.6  HGB 9.5*   < > 9.6* 9.4* 8.8* 9.3* 8.9*  HCT 29.7*   < > 29.5* 29.6* 27.4* 28.9* 28.1*  MCV 89.5  --  89.9 91.1 91.0  --  91.2  PLT 389  --  384 378 372  --  390   < > = values in this interval not displayed.    Basic Metabolic Panel: Recent Labs  Lab 10/17/19 0519 10/17/19 0519 10/18/19 0338 10/19/19 0352 10/20/19 0405 10/21/19 0504 10/22/19 0342  NA 137  --  139 139 140 139  --   K 3.9  --  4.1 4.9 4.4 4.0  --   CL 102  --  104 104 104 103  --   CO2 21*  --  24 27 26 26   --   GLUCOSE 166*  --  144* 141* 119* 113*  --   BUN 17  --  17 17 17  24*  --   CREATININE 0.67  --  0.62 0.78 0.68 0.70  --   CALCIUM 9.0  --  8.9 9.2 9.3 9.2  --   MG 1.6*   < > 1.9 1.8 1.7 2.1 1.9  PHOS 3.0  --   --   --   --   --   --    < > = values in this interval not displayed.   GFR: Estimated Creatinine Clearance: 82.9 mL/min (by C-G formula based on SCr of 0.7 mg/dL). Recent Labs  Lab 10/16/19 1608 10/16/19 1608 10/17/19 0519 10/18/19 0338 10/19/19 0352 10/20/19 0405 10/21/19 0504  10/22/19 0342  PROCALCITON <0.10  --  <0.10  --   --   --   --   --   WBC 6.9   < > 6.9   < > 8.2 7.8 6.0 7.4  LATICACIDVEN 1.7  --   --   --   --   --   --   --    < > = values in this interval not displayed.  Liver Function Tests: Recent Labs  Lab 10/17/19 0519 10/18/19 0338 10/19/19 0352 10/20/19 0405 10/21/19 0504  AST 27 32 32 41 45*  ALT 16 22 20 23 23   ALKPHOS 49 51 56 58 58  BILITOT 0.4 0.3 0.5 0.5 0.5  PROT 5.9* 5.7* 6.2* 6.1* 6.0*  ALBUMIN 2.6* 2.4* 2.6* 2.6* 2.6*   No results for input(s): LIPASE, AMYLASE in the last 168 hours. No results for input(s): AMMONIA in the last 168 hours.  ABG    Component Value Date/Time   HCO3 29.1 (H) 03/07/2016 1420   TCO2 31 03/07/2016 1420   O2SAT 44.0 03/07/2016 1420     Coagulation Profile: No results for input(s): INR, PROTIME in the last 168 hours.  Cardiac Enzymes: No results for input(s): CKTOTAL, CKMB, CKMBINDEX, TROPONINI in the last 168 hours.  HbA1C: Hgb A1c MFr Bld  Date/Time Value Ref Range Status  10/11/2019 04:54 AM 7.8 (H) 4.8 - 5.6 % Final    Comment:    (NOTE) Pre diabetes:          5.7%-6.4% Diabetes:              >6.4% Glycemic control for   <7.0% adults with diabetes   12/02/2018 10:42 AM 5.7 4.6 - 6.5 % Final    Comment:    Glycemic Control Guidelines for People with Diabetes:Non Diabetic:  <6%Goal of Therapy: <7%Additional Action Suggested:  >8%     CBG: Recent Labs  Lab 10/21/19 1627 10/21/19 2050 10/21/19 2341 10/22/19 0435 10/22/19 0744  GLUCAP 105* 163* 105* 114* 195*     Signature:   Noe Gens, MSN, NP-C [facility] 10/22/2019, 8:49 AM   Please see [email or web address] for pager details.

## 2019-10-22 NOTE — TOC Progression Note (Signed)
Transition of Care [facility]) - Progression Note    Patient Details  Name: Autumn Cook MRN: [id number] Date of Birth: May 05, 1961  [facility] CM/SW Contact  Thornton Dohrmann, Juliann Pulse, RN Phone Number: 10/22/2019, 3:43 PM  Clinical Narrative: Still unable to find [facility] agency to accept.      Expected Discharge Plan: [place] Barriers to Discharge: No [place] will accept this patient  Expected Discharge Plan and Services Expected Discharge Plan: [place]   Discharge Planning Services: CM Consult   Living arrangements for the past 2 months: Single Family Home Expected Discharge Date:  (unknown)                                     Social Determinants of Health (SDOH) Interventions    Readmission Risk Interventions No flowsheet data found.

## 2019-10-22 NOTE — Progress Notes (Signed)
I completed a follow up visit with Autumn Cook, with her daughter, [name], in the room. I asked her how she was doing today and she responded that she was doing okay. She thanked me for following up with her and that she did not need a visit at this time. I shared that the Chaplain is available to provide additional support as needed or requested.    10/22/19 1213  Clinical Encounter Type  Visited With Patient and family together  Visit Type Follow-up;Spiritual support  Referral From Nurse  Consult/Referral To Chaplain    Chaplain Dr [name] Braylei Totino

## 2019-10-22 NOTE — Progress Notes (Signed)
Left PleurX catheter clamped and end cap applied.     Noe Gens, MSN, NP-C [facility] 10/22/2019, 11:59 AM   Please see [email or web address] for pager details.

## 2019-10-22 NOTE — Progress Notes (Addendum)
PROGRESS NOTE    Autumn Cook  PYP:[id number] DOB: Sep 25, 1960 DOA: 10/16/2019 PCP: Darreld Mclean, MD   Chief Complaint  Patient presents with  . Shortness of Breath  . Chest Pain    Brief Narrative:  Patient is Autumn Cook 60 year old female with history of right breast cancer, hypertension, hyperlipidemia, type 2 diabetes mellitus, asthma who presents to the emergency department with complaints of central sharp chest pain as well as shortness of breath. She was recently admitted here and discharged 2 days prior to presentation when she underwent Pleurx catheter placement by PCCM after she was found to have large left pleural effusion secondary to her breast cancer. Patient did not know how to use the Pleurx catheter at home. On presentation, chest x-ray showed complete white out of the left lung but there was also suspicion for new consolidations on the right side concerning for pneumonia for which she was started on antibiotics.  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 She is on 2 L of oxygen at home at baseline. She was recently admitted here and underwent left-sided Pleurx catheter but she did not know how to operate it at home. - Chest tube clamped per PCCM, resume 2x weekly drainage - pulm suspects element of  trapped lung, won't be candidate for pleurodesis due to this - note it may be beneficial to start therapy for metastatic breast cancer as this may help control accumulation of pleural fluid -> will discuss with oncology - CXR 6/15 with decrease in volume of L pleural effusion and improved aeration to the LUL and left midlung - Oncology following, appreciate recommendations - Palliative care following for pain control   Suspected HCAP:CXR onpresentation showed possible consolidations on the right side.She is not febrile or septic. She wasstarted on cefepime by admitting physician.Antibiotics have been stopped as there was very low suspicion for pneumonia. Procalcitonin also was negative.  Malignantneoplasm of the right breast: [name] is following. Notstarted on chemotherapy yet.  Hypertension: Blood pressure stable at this time.  Continue atenolol.  Continue to monitor and adjust medications as needed.  Diabetes mellitus: A1c of 7.8. Continue sliding scaleinsulin for now.She is not on any medications at home. Consider Metformin 500 mg twice [name] day on discharge.  Hypomagnesemia: Magnesium replaced.  Potassium stable.  Goals of care:Palliative care consulted for pain management, goals of care.  Pain control- appreciate palliative care  Left IJ - needs to be d/c'd once alternative access obtained - discussed with nursing, obtain PIV  DVT prophylaxis: (lovenox Code Status: full  Family Communication: daughter at bedside Disposition:   Status is: Inpatient  Remains inpatient appropriate because:Inpatient level of care appropriate due to severity of illness   Dispo: The patient is from: Home              Anticipated d/c is to: Home  Anticipated d/c date is: 2 days              Patient currently is not medically stable to d/c.   Consultants:   PCCM  Pulm  Oncology  Procedures:  none  Antimicrobials: Anti-infectives (From admission,  onward)   Start     Dose/Rate Route Frequency Ordered Stop   10/16/19 1400  ceFEPIme (MAXIPIME) 2 g in sodium chloride 0.9 % 100 mL IVPB  Status:  Discontinued        2 g 200 mL/hr over 30 Minutes Intravenous Every 8 hours 10/16/19 0817 10/17/19 1426   10/16/19 0530  vancomycin (VANCOCIN) IVPB 1000 mg/200 mL premix        1,000 mg 200 mL/hr over 60 Minutes Intravenous  Once 10/16/19 0520 10/16/19 0741   10/16/19 0530  ceFEPIme (MAXIPIME) 2 g in sodium chloride 0.9 % 100 mL IVPB        2 g 200 mL/hr over 30 Minutes Intravenous  Once 10/16/19 0520 10/16/19 0741      Subjective: No new complaints  Objective: Vitals:   10/21/19 2333 10/22/19 0449 10/22/19 0802 10/22/19 1256  BP: 122/77 103/69  116/69  Pulse: 67 71 74 81  Resp: 14  14 16   Temp: 97.8 F (36.6 C) 98.2 F (36.8 C)  99.2 F (37.3 C)  TempSrc: Oral Oral  Oral  SpO2: 96% 100% 99% 92%  Weight:      Height:        Intake/Output Summary (Last 24 hours) at 10/22/2019 1943 Last data filed at 10/22/2019 1700 Gross per 24 hour  Intake 610 ml  Output --  Net 610 ml   Filed Weights   10/16/19 0431  Weight: 80.3 kg    Examination:  General exam: Appears calm and comfortable  Respiratory system: decreased breath sounds on L, pleurx catheter capped Cardiovascular system: S1 & S2 heard, RRR.  Gastrointestinal system: Abdomen is nondistended, soft and nontender.  Central nervous system: Alert and oriented. No focal neurological deficits. Extremities: no LEE Skin: No rashes, lesions or ulcers Psychiatry: Judgement and insight appear normal. Mood & affect appropriate.     Data Reviewed: I have personally reviewed following labs and imaging studies  CBC: Recent Labs  Lab 10/18/19 0338 10/18/19 0338 10/19/19 0352 10/20/19 0405 10/21/19 0504 10/21/19 2010 10/22/19 0342  WBC 7.4  --  8.2 7.8 6.0  --  7.4  NEUTROABS 5.9  --  5.9 5.9 4.3  --  5.6  HGB 9.5*   < > 9.6* 9.4* 8.8* 9.3* 8.9*  HCT 29.7*   < > 29.5*  29.6* 27.4* 28.9* 28.1*  MCV 89.5  --  89.9 91.1 91.0  --  91.2  PLT 389  --  384 378 372  --  390   < > = values in this interval not displayed.    Basic Metabolic Panel: Recent Labs  Lab 10/17/19 0519 10/17/19 0519 10/18/19 0338 10/19/19 0352 10/20/19 0405 10/21/19 0504 10/22/19 0342  NA 137   < > 139 139 140 139 141  K 3.9   < > 4.1 4.9 4.4 4.0 4.3  CL 102   < > 104 104 104 103 106  CO2 21*   < > 24 27 26 26 25   GLUCOSE 166*   < > 144* 141* 119* 113* 100*  BUN 17   < > 17 17 17  24* 31*  CREATININE 0.67   < > 0.62 0.78 0.68 0.70 0.98  CALCIUM 9.0   < >  8.9 9.2 9.3 9.2 9.0  MG 1.6*   < > 1.9 1.8 1.7 2.1 1.9  PHOS 3.0  --   --   --   --   --   --    < > = values in this interval not displayed.    GFR: Estimated Creatinine Clearance: 67.7 mL/min (by C-G formula based on SCr of 0.98 mg/dL).  Liver Function Tests: Recent Labs  Lab 10/17/19 0519 10/18/19 0338 10/19/19 0352 10/20/19 0405 10/21/19 0504  AST 27 32 32 41 45*  ALT 16 22 20 23 23   ALKPHOS 49 51 56 58 58  BILITOT 0.4 0.3 0.5 0.5 0.5  PROT 5.9* 5.7* 6.2* 6.1* 6.0*  ALBUMIN 2.6* 2.4* 2.6* 2.6* 2.6*    CBG: Recent Labs  Lab 10/21/19 2341 10/22/19 0435 10/22/19 0744 10/22/19 1152 10/22/19 1639  GLUCAP 105* 114* 195* 101* 132*     Recent Results (from the past 240 hour(s))  MRSA PCR Screening     Status: None   Collection Time: 10/13/19  9:37 AM   Specimen: Nasal Mucosa; Nasopharyngeal  Result Value Ref Range Status   MRSA by PCR NEGATIVE NEGATIVE Final    Comment:        The GeneXpert MRSA Assay (FDA approved for NASAL specimens only), is one component of Abena Erdman comprehensive MRSA colonization surveillance program. It is not intended to diagnose MRSA infection nor to guide or monitor treatment for MRSA infections. Performed at [facility], [place] [address], [place], [place] [address]   SARS Coronavirus 2 by RT PCR (hospital order, performed in [facility] hospital lab)  Nasopharyngeal Nasopharyngeal Swab     Status: None   Collection Time: 10/16/19 12:43 AM   Specimen: Nasopharyngeal Swab  Result Value Ref Range Status   SARS Coronavirus 2 NEGATIVE NEGATIVE Final    Comment: (NOTE) SARS-CoV-2 target nucleic acids are NOT DETECTED.  The SARS-CoV-2 RNA is generally detectable in upper and lower respiratory specimens during the acute phase of infection. The lowest concentration of SARS-CoV-2 viral copies this assay can detect is 250 copies / mL. Orbin Mayeux negative result does not preclude SARS-CoV-2 infection and should not be used as the sole basis for treatment or other patient management decisions.  Amoura Ransier negative result may occur with improper specimen collection / handling, submission of specimen other than nasopharyngeal swab, presence of viral mutation(s) within the areas targeted by this assay, and inadequate number of viral copies (<250 copies / mL). Sanaia Jasso negative result must be combined with clinical observations, patient history, and epidemiological information.  Fact Sheet for Patients:   StrictlyIdeas.no  Fact Sheet for Healthcare Providers: BankingDealers.co.za  This test is not yet approved or  cleared by the [place] FDA and has been authorized for detection and/or diagnosis of SARS-CoV-2 by FDA under an Emergency Use Authorization (EUA).  This EUA will remain in effect (meaning this test can be used) for the duration of the COVID-19 declaration under Section 564(b)(1) of the Act, 21 U.S.C. section 360bbb-3(b)(1), unless the authorization is terminated or revoked sooner.  Performed at [facility], [address] [place], [place] [address]   Blood culture (routine x 2)     Status: None   Collection Time: 10/16/19  5:20 AM   Specimen: BLOOD LEFT ARM  Result Value Ref Range Status   Specimen Description   Final    BLOOD LEFT ARM Performed at [place]  [address], [place],  [address]  Special Requests   Final    BOTTLES DRAWN AEROBIC AND ANAEROBIC Blood Culture results may not be optimal due to an inadequate volume of blood received in culture bottles Performed at [facility] [address] [place], [place] [address]    Culture   Final    NO GROWTH 5 DAYS Performed at [facility], [place] [address], [place], [place] [address]    Report Status 10/21/2019 FINAL  Final  Body fluid culture (includes gram stain)     Status: None   Collection Time: 10/16/19  7:48 AM   Specimen: Pleural Fluid  Result Value Ref Range Status   Specimen Description   Final    PLEURAL Performed at [facility] [address], [place], [place] [address]    Special Requests   Final    NONE Performed at [facility], [place] [address], [place], [place] [address]    Gram Stain   Final    RARE WBC PRESENT, PREDOMINANTLY MONONUCLEAR NO ORGANISMS SEEN    Culture   Final    NO GROWTH 3 DAYS Performed at [facility], [place] [address], [place], [place] [address]    Report Status 10/19/2019 FINAL  Final  Fungus Culture With Stain     Status: None (Preliminary result)   Collection Time: 10/16/19  7:48 AM   Specimen: Pleural Fluid  Result Value Ref Range Status   Fungus Stain Final report  Final    Comment: (NOTE) Performed At: [facility] [address], [place] [id number] Rush Farmer MD VP:[id number]    Fungus (Mycology) Culture PENDING  Incomplete   Fungal Source PLEURAL  Final    Comment: Performed at [facility], [place] [address], [place], [place] [address]  Fungus Culture Result     Status: None   Collection Time: 10/16/19  7:48 AM  Result Value Ref Range Status   Result 1 Comment  Final    Comment: (NOTE) KOH/Calcofluor preparation:  no fungus observed. Performed At: [facility] [place], [place]  [id number] Rush Farmer MD EX:[id number]   Blood culture (routine x 2)     Status: None   Collection Time: 10/16/19  9:42 PM   Specimen: BLOOD  Result Value Ref Range Status   Specimen Description   Final    BLOOD LEFT HAND Performed at [facility], [place] [address] [place], [place] [address]    Special Requests   Final    BOTTLES DRAWN AEROBIC ONLY Blood Culture adequate volume Performed at [place] [address], [place], [place] [address]    Culture   Final    NO GROWTH 5 DAYS Performed at [facility], [place] [address], [place],  [address]    Report Status 10/22/2019 FINAL  Final         Radiology Studies: DG Chest Port 1 View  Result Date: 10/22/2019 CLINICAL DATA:  Pleural effusion. EXAM: PORTABLE CHEST 1 VIEW COMPARISON:  10/19/2019 FINDINGS: Left IJ catheter tip projects over the SVC. The left pleural effusion has decreased in volume compared with the previous exam. There is improved aeration to the left upper lobe and left midlung. The large left upper lobe lung mass is again seen. IMPRESSION: 1. Decrease in volume of left pleural effusion and improved aeration to the left upper lobe and left midlung. 2. Stable left upper lobe lung mass. Electronically Signed   By:  Kerby Moors M.D.   On: 10/22/2019 08:32        Scheduled Meds: . acetaminophen  500 mg Oral TID WC  . atenolol  100 mg Oral Daily  . Chlorhexidine Gluconate Cloth  6 each Topical Daily  . docusate sodium  200 mg Oral Daily  . pulmozyme (DORNASE) for intrapleural administration  5 mg Intrapleural Once  . enoxaparin (LOVENOX) injection  40 mg Subcutaneous Q24H  . feeding supplement (ENSURE ENLIVE)  237 mL Oral TID BM  . fentaNYL  1 patch Transdermal Q72H  . insulin aspart  0-15 Units Subcutaneous Q4H  . lactulose  10 g Oral Daily  . levalbuterol  1.25 mg Nebulization BID  . mouth rinse  15 mL Mouth Rinse BID  . naproxen  250 mg Oral TID WC   . polyethylene glycol  17 g Oral Daily  . sodium chloride flush  10-40 mL Intracatheter Q12H  . sucralfate  1 g Oral TID WC & HS   Continuous Infusions:   LOS: 6 days    Time spent: over 30 min    Fayrene [place], MD [facility]   To contact the attending provider between 7A-7P or the covering provider during after hours 7P-7A, please log into the web site [email or web address] and access using universal [place] password for that web site. If you do not have the password, please call the hospital operator.  10/22/2019, 7:43 PM

## 2019-10-23 ENCOUNTER — Inpatient Hospital Stay (HOSPITAL_COMMUNITY): Payer: 59

## 2019-10-23 DIAGNOSIS — C7981 Secondary malignant neoplasm of breast: Secondary | ICD-10-CM

## 2019-10-23 LAB — CBC WITH DIFFERENTIAL/PLATELET
Abs Immature Granulocytes: 0.1 10*3/uL — ABNORMAL HIGH (ref 0.00–0.07)
Basophils Absolute: 0 10*3/uL (ref 0.0–0.1)
Basophils Relative: 0 %
Eosinophils Absolute: 0.1 10*3/uL (ref 0.0–0.5)
Eosinophils Relative: 2 %
HCT: 26.6 % — ABNORMAL LOW (ref 36.0–46.0)
Hemoglobin: 8.8 g/dL — ABNORMAL LOW (ref 12.0–15.0)
Immature Granulocytes: 1 %
Lymphocytes Relative: 9 %
Lymphs Abs: 0.7 10*3/uL (ref 0.7–4.0)
MCH: 29.3 pg (ref 26.0–34.0)
MCHC: 33.1 g/dL (ref 30.0–36.0)
MCV: 88.7 fL (ref 80.0–100.0)
Monocytes Absolute: 0.6 10*3/uL (ref 0.1–1.0)
Monocytes Relative: 8 %
Neutro Abs: 5.9 10*3/uL (ref 1.7–7.7)
Neutrophils Relative %: 80 %
Platelets: 368 10*3/uL (ref 150–400)
RBC: 3 MIL/uL — ABNORMAL LOW (ref 3.87–5.11)
RDW: 13.6 % (ref 11.5–15.5)
WBC: 7.4 10*3/uL (ref 4.0–10.5)
nRBC: 0 % (ref 0.0–0.2)

## 2019-10-23 LAB — COMPREHENSIVE METABOLIC PANEL
ALT: 26 U/L (ref 0–44)
AST: 67 U/L — ABNORMAL HIGH (ref 15–41)
Albumin: 2.4 g/dL — ABNORMAL LOW (ref 3.5–5.0)
Alkaline Phosphatase: 66 U/L (ref 38–126)
Anion gap: 9 (ref 5–15)
BUN: 27 mg/dL — ABNORMAL HIGH (ref 6–20)
CO2: 24 mmol/L (ref 22–32)
Calcium: 8.9 mg/dL (ref 8.9–10.3)
Chloride: 106 mmol/L (ref 98–111)
Creatinine, Ser: 0.79 mg/dL (ref 0.44–1.00)
GFR calc Af Amer: >60 mL/min (ref >60)
GFR calc non Af Amer: >60 mL/min (ref >60)
Glucose, Bld: 158 mg/dL — ABNORMAL HIGH (ref 70–99)
Potassium: 4.1 mmol/L (ref 3.5–5.1)
Sodium: 139 mmol/L (ref 135–145)
Total Bilirubin: 0.6 mg/dL (ref 0.3–1.2)
Total Protein: 5.7 g/dL — ABNORMAL LOW (ref 6.5–8.1)

## 2019-10-23 LAB — GLUCOSE, CAPILLARY
Glucose-Capillary: 179 mg/dL — ABNORMAL HIGH (ref 70–99)
Glucose-Capillary: 151 mg/dL — ABNORMAL HIGH (ref 70–99)
Glucose-Capillary: 126 mg/dL — ABNORMAL HIGH (ref 70–99)
Glucose-Capillary: 111 mg/dL — ABNORMAL HIGH (ref 70–99)
Glucose-Capillary: 185 mg/dL — ABNORMAL HIGH (ref 70–99)
Glucose-Capillary: 115 mg/dL — ABNORMAL HIGH (ref 70–99)
Glucose-Capillary: 166 mg/dL — ABNORMAL HIGH (ref 70–99)

## 2019-10-23 LAB — PHOSPHORUS: Phosphorus: 2.8 mg/dL (ref 2.5–4.6)

## 2019-10-23 LAB — MAGNESIUM: Magnesium: 1.6 mg/dL — ABNORMAL LOW (ref 1.7–2.4)

## 2019-10-23 MED ORDER — MAGNESIUM SULFATE 2 GM/50ML IV SOLN
2.0000 g | Freq: Once | INTRAVENOUS | Status: AC
  Administered 2019-10-23: 2 g via INTRAVENOUS
  Filled 2019-10-23: qty 50

## 2019-10-23 NOTE — Progress Notes (Signed)
NAME:  Autumn Cook, MRN:  [id number], DOB:  October 23, 1960, LOS: 7 ADMISSION DATE:  10/16/2019, CONSULTATION DATE:  10/20/2019 REFERRING MD:  Dr. Barth Kirks, CHIEF COMPLAINT:  Pleural effusion   Brief History   59 yo female former smoker with metastatic breast cancer complicated by malignant pleural effusion.  She had pleurx catheter placed on 10/14/19, but continues to have white out of Lt hemithorax on chest xray.  PCCM asked to assess.  History of present illness   59 yo female diagnosed with multicentric invasive ductal carcinoma after having abnormal mammogram on 07/01/18.  She was found to have Rt axillary nodal involvement.  She had neoadjuvant chemotherapy with doxorubicin and cyclophosphamide from 08/07/18 to 09/06/18.  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  She had adjuvant radiation therapy to Rt breast and axilla from 1013/20 to 04/02/19.  She was started on anastrozole on 05/14/18.  She was seen by oncology on 09/30/19 and noted to have Lt flank pain, dry cough, fatigue, and chest tightness.  She was treated for possible respiratory infection and asthma exacerbation.  She had renal stone CT study.  Found to have large Lt pleural effusion and 2 hypoattenuating lesions on her liver.  She had Lt thoracentesis by IR on 10/03/19 with 950 ml fluid removed.  Fluid analysis showed malignant cells on cytology review.  Her breathing continued to get worse.  She went to hospital on 10/09/19.  She had pleurx catheter placed on 10/13/19.  She was noted to have 1800 ml of fluid removed.  However, CXR didn't show significant change.  She was discharged home on 10/14/19.    She returned to hospital on 10/16/19.  She was still feeling short of breath and had sharp chest pain.  CXR showed white out of Lt hemithorax.  Staff unable to drain fluid from pleurx catheter.  She wasn't able to get supplies at home to drain catheter before she had to come back to  ER.  Pain symptoms better since her pain medications adjusted.  Still short of breath with activity and now needing supplemental oxygen.  Not having sputum, fever, or hemoptysis.  Past Medical History  Allergies, Asthma, DM, HTN, Migraine headache  Significant Hospital Events   6/10 Admit 6/14 L PleurX flushed with 60 ml saline  Consults:  Oncology  Procedures:  Lt IJ CVL 6/10 >>   Significant Diagnostic Tests:  Lt thoracentesis 10/03/19 >> protein 5.4, LDH 1473, WBC 3118 (46% lymphocytes, 34% macrophages, 20% neutrophils), cytology positive for malignant cells from breast primary  Micro Data:  Lt pleural fluid 6/10 >>  Blood 6/10 >>   Antimicrobials:  Vancomycin 6/09 Cefepime 6/09 >> 6/11  Interim history/subjective:  Breathing better today.  Not having chest pain.  Objective   Blood pressure 123/83, pulse 87, temperature 98.7 F (37.1 C), temperature source Oral, resp. rate 18, height 5\' 6"  (1.676 m), weight 80.3 kg, last menstrual period 11/14/2013, SpO2 99 %.        Intake/Output Summary (Last 24 hours) at 10/23/2019 1430 Last data filed at 10/23/2019 1300 Gross per 24 hour  Intake 360 ml  Output --  Net 360 ml   Filed Weights   10/16/19 0431  Weight: 80.3 kg    Examination:  General - alert Eyes - pupils reactive ENT - no sinus tenderness, no stridor Cardiac - regular rate/rhythm, no murmur Chest - decreased BS on Lt Abdomen - soft, non tender, + bowel  sounds Extremities - no cyanosis, clubbing, or edema Skin - no rashes Neuro - follows commands Psych - normal mood and behavior   Assessment & Plan:   Malignant Lt pleural effusion in setting of metastatic breast cancer. - s/p pleurx catheter - reports chest pain with pleural fluid drainage; likely has trapped lung - keep pleurx in place for now - demonstrated to family process of intermittent draining from pleurx - she will need outpt pulmonary follow up  Metastatic breast cancer. - oncology  tentatively planning to arrange for therapy next week  Asthma - continue xopenex  Hypoxia. - O2 to support sats >90%  Labs    CMP Latest Ref Rng & Units 10/23/2019 10/22/2019 10/21/2019  Glucose 70 - 99 mg/dL 158(H) 100(H) 113(H)  BUN 6 - 20 mg/dL 27(H) 31(H) 24(H)  Creatinine 0.44 - 1.00 mg/dL 0.79 0.98 0.70  Sodium 135 - 145 mmol/L 139 141 139  Potassium 3.5 - 5.1 mmol/L 4.1 4.3 4.0  Chloride 98 - 111 mmol/L 106 106 103  CO2 22 - 32 mmol/L 24 25 26   Calcium 8.9 - 10.3 mg/dL 8.9 9.0 9.2  Total Protein 6.5 - 8.1 g/dL 5.7(L) - 6.0(L)  Total Bilirubin 0.3 - 1.2 mg/dL 0.6 - 0.5  Alkaline Phos 38 - 126 U/L 66 - 58  AST 15 - 41 U/L 67(H) - 45(H)  ALT 0 - 44 U/L 26 - 23    CBC Latest Ref Rng & Units 10/23/2019 10/22/2019 10/21/2019  WBC 4.0 - 10.5 K/uL 7.4 7.4 -  Hemoglobin 12.0 - 15.0 g/dL 8.8(L) 8.9(L) 9.3(L)  Hematocrit 36 - 46 % 26.6(L) 28.1(L) 28.9(L)  Platelets 150 - 400 K/uL 368 390 -    CBG (last 3)  Recent Labs    10/23/19 0543 10/23/19 0746 10/23/19 1201  GLUCAP 126* 111* 185*    Signature:   Chesley Mires, MD [place] Pager - [phone] 10/23/2019, 2:33 PM

## 2019-10-23 NOTE — TOC Progression Note (Signed)
Transition of Care [facility]) - Progression Note    Patient Details  Name: Autumn Cook MRN: [id number] Date of Birth: 1960-05-28  [facility]) CM/SW Contact  Thor Nannini, Juliann Pulse, RN Phone Number: 10/23/2019, 1:01 PM  Clinical Narrative: still unable to find [facility] agency to accept for HHRN-pleurx drain care.Informed nsg to instruct patient/family on pleurx cath drain care & document instruction. Informed patient/family in rm about private duty care resource,also drainage bags-they can purchase at medical supply stores.Nsg can order 1 box pleurx drain cannisters from hospital to take home.continue to monitor for d/c needs.      Expected Discharge Plan: [place] Barriers to Discharge: No [facility] will accept this patient  Expected Discharge Plan and Services Expected Discharge Plan: [place]   Discharge Planning Services: CM Consult   Living arrangements for the past 2 months: Single Family Home Expected Discharge Date:  (unknown)                                     Social Determinants of Health (SDOH) Interventions    Readmission Risk Interventions No flowsheet data found.

## 2019-10-23 NOTE — Progress Notes (Signed)
PROGRESS NOTE    Autumn Cook  WUJ:[id number] DOB: [birth year] DOA: 10/16/2019 PCP: Darreld Mclean, MD   Chief Complaint  Patient presents with  . Shortness of Breath  . Chest Pain    Brief Narrative:  Patient is Autumn Cook 59 year old female with history of right breast cancer, hypertension, hyperlipidemia, type 2 diabetes mellitus, asthma who presents to the emergency department with complaints of central sharp chest pain as well as shortness of breath. She was recently admitted here and discharged 2 days prior to presentation when she underwent Pleurx catheter placement by PCCM after she was found to have large left pleural effusion secondary to her breast cancer. Patient did not know how to use the Pleurx catheter at home. On presentation, chest x-ray showed complete white out of the left lung but there was also suspicion for new consolidations on the right side concerning for pneumonia for which she was started on antibiotics.  Assessment & Plan:   Active Problems:   Hypertension   History of tobacco use   Hypercholesterolemia   Malignant neoplasm of overlapping sites of right breast in female, estrogen receptor positive ([place])   Encounter for central line placement   Malignant pleural effusion   Pleural effusion on left   Metastases to the liver [facility])   Acute respiratory failure (HCC)   Diabetes mellitus type 2, uncontrolled, with complications (HCC)   Acute respiratory failure with hypoxia (HCC)   Pneumonia of right lower lobe due to infectious organism   Generalized pain   General weakness   Palliative care by specialist  Acute on chronic respiratory failure with hypoxia: Secondary to left-sided extensivemalignantpleural effusion. She is on 2 L of oxygen at home at baseline. She was recently admitted here and underwent left-sided Pleurx catheter but she did not know how to operate it at home. - Chest tube clamped per PCCM, resume 2x weekly drainage - pulm suspects  element of trapped lung, won't be candidate for pleurodesis due to this - note it may be beneficial to start therapy for metastatic breast cancer as this may help control accumulation of pleural fluid -> will discuss with oncology - CXR 6/15 with decrease in volume of L pleural effusion and improved aeration to the LUL and left midlung - CXR 6/17 with pleurx catheter on L and progression of large L effusion and left lower lobe collapse - Oncology following, appreciate recommendations - Palliative care following for pain control   Suspected HCAP:CXR onpresentation showed possible consolidations on the right side.She is not febrile or septic. She wasstarted on cefepime by admitting physician.Antibiotics have been stopped as there was very low suspicion for pneumonia. Procalcitonin also was negative.  Malignantneoplasm of the right breast: [name] is following. Notstarted on chemotherapy yet.  Hypertension: Blood pressure stable at this time.  Continue atenolol.  Continue to monitor and adjust medications as needed.  Diabetes mellitus: A1c of 7.8. Continue sliding scaleinsulin for now.She is not on any medications at home. Consider Metformin 500 mg twice Autumn Cook day on discharge.  Hypomagnesemia: Replace mag.  Potassium stable.  Goals of care:Palliative care consulted for pain management, goals of care.  Pain control- appreciate palliative care  Left IJ - needs to be d/c'd once alternative access obtained - discussed with nursing, obtain PIV  DVT prophylaxis: (lovenox Code Status: full  Family Communication: daughter at bedside Disposition:   Status is: Inpatient  Remains inpatient appropriate because:Inpatient level of care appropriate due to severity of illness   Dispo: The patient  is from: Home              Anticipated d/c is to: Home              Anticipated d/c date is: 2 days              Patient currently is not medically stable to d/c.   Consultants:     PCCM  Pulm  Oncology  Procedures:  none  Antimicrobials: Anti-infectives (From admission, onward)   Start     Dose/Rate Route Frequency Ordered Stop   10/16/19 1400  ceFEPIme (MAXIPIME) 2 g in sodium chloride 0.9 % 100 mL IVPB  Status:  Discontinued        2 g 200 mL/hr over 30 Minutes Intravenous Every 8 hours 10/16/19 0817 10/17/19 1426   10/16/19 0530  vancomycin (VANCOCIN) IVPB 1000 mg/200 mL premix        1,000 mg 200 mL/hr over 60 Minutes Intravenous  Once 10/16/19 0520 10/16/19 0741   10/16/19 0530  ceFEPIme (MAXIPIME) 2 g in sodium chloride 0.9 % 100 mL IVPB        2 g 200 mL/hr over 30 Minutes Intravenous  Once 10/16/19 0520 10/16/19 0741      Subjective: No new complaints  Objective: Vitals:   10/23/19 0543 10/23/19 0838 10/23/19 1202 10/23/19 1636  BP: 138/71  123/83 122/78  Pulse: (!) 102  87 82  Resp: 18  18 16   Temp: 99.3 F (37.4 C)  98.7 F (37.1 C) 98.1 F (36.7 C)  TempSrc: Oral  Oral Oral  SpO2: 96% 97% 99% 100%  Weight:      Height:        Intake/Output Summary (Last 24 hours) at 10/23/2019 1923 Last data filed at 10/23/2019 1800 Gross per 24 hour  Intake 360 ml  Output --  Net 360 ml   Filed Weights   10/16/19 0431  Weight: 80.3 kg    Examination:  General: No acute distress. Cardiovascular: Heart sounds show Autumn Cook regular rate, and rhythm.  Lungs: L sided pleurx Abdomen: Soft, nontender, nondistended  Neurological: Alert and oriented 3. Moves all extremities 4. Cranial nerves II through XII grossly intact. Skin: Warm and dry. No rashes or lesions. Extremities: No clubbing or cyanosis. No edema.   Data Reviewed: I have personally reviewed following labs and imaging studies  CBC: Recent Labs  Lab 10/19/19 0352 10/19/19 0352 10/20/19 0405 10/21/19 0504 10/21/19 2010 10/22/19 0342 10/23/19 0509  WBC 8.2  --  7.8 6.0  --  7.4 7.4  NEUTROABS 5.9  --  5.9 4.3  --  5.6 5.9  HGB 9.6*   < > 9.4* 8.8* 9.3* 8.9* 8.8*  HCT 29.5*    < > 29.6* 27.4* 28.9* 28.1* 26.6*  MCV 89.9  --  91.1 91.0  --  91.2 88.7  PLT 384  --  378 372  --  390 368   < > = values in this interval not displayed.    Basic Metabolic Panel: Recent Labs  Lab 10/17/19 0519 10/18/19 0338 10/19/19 0352 10/20/19 0405 10/21/19 0504 10/22/19 0342 10/23/19 0509  NA 137   < > 139 140 139 141 139  K 3.9   < > 4.9 4.4 4.0 4.3 4.1  CL 102   < > 104 104 103 106 106  CO2 21*   < > 27 26 26 25 24   GLUCOSE 166*   < > 141* 119* 113* 100* 158*  BUN 17   < >  17 17 24* 31* 27*  CREATININE 0.67   < > 0.78 0.68 0.70 0.98 0.79  CALCIUM 9.0   < > 9.2 9.3 9.2 9.0 8.9  MG 1.6*   < > 1.8 1.7 2.1 1.9 1.6*  PHOS 3.0  --   --   --   --   --  2.8   < > = values in this interval not displayed.    GFR: Estimated Creatinine Clearance: 82.9 mL/min (by C-G formula based on SCr of 0.79 mg/dL).  Liver Function Tests: Recent Labs  Lab 10/18/19 0338 10/19/19 0352 10/20/19 0405 10/21/19 0504 10/23/19 0509  AST 32 32 41 45* 67*  ALT 22 20 23 23 26   ALKPHOS 51 56 58 58 66  BILITOT 0.3 0.5 0.5 0.5 0.6  PROT 5.7* 6.2* 6.1* 6.0* 5.7*  ALBUMIN 2.4* 2.6* 2.6* 2.6* 2.4*    CBG: Recent Labs  Lab 10/23/19 0426 10/23/19 0543 10/23/19 0746 10/23/19 1201 10/23/19 1635  GLUCAP 151* 126* 111* 185* 115*     Recent Results (from the past 240 hour(s))  SARS Coronavirus 2 by RT PCR (hospital order, performed in [facility] hospital lab) Nasopharyngeal Nasopharyngeal Swab     Status: None   Collection Time: 10/16/19 12:43 AM   Specimen: Nasopharyngeal Swab  Result Value Ref Range Status   SARS Coronavirus 2 NEGATIVE NEGATIVE Final    Comment: (NOTE) SARS-CoV-2 target nucleic acids are NOT DETECTED.  The SARS-CoV-2 RNA is generally detectable in upper and lower respiratory specimens during the acute phase of infection. The lowest concentration of SARS-CoV-2 viral copies this assay can detect is 250 copies / mL. Autumn Cook negative result does not preclude SARS-CoV-2  infection and should not be used as the sole basis for treatment or other patient management decisions.  Autumn Cook negative result may occur with improper specimen collection / handling, submission of specimen other than nasopharyngeal swab, presence of viral mutation(s) within the areas targeted by this assay, and inadequate number of viral copies (<250 copies / mL). Autumn Cook negative result must be combined with clinical observations, patient history, and epidemiological information.  Fact Sheet for Patients:   StrictlyIdeas.no  Fact Sheet for Healthcare Providers: BankingDealers.co.za  This test is not yet approved or  cleared by the [place] FDA and has been authorized for detection and/or diagnosis of SARS-CoV-2 by FDA under an Emergency Use Authorization (EUA).  This EUA will remain in effect (meaning this test can be used) for the duration of the COVID-19 declaration under Section 564(b)(1) of the Act, 21 U.S.C. section 360bbb-3(b)(1), unless the authorization is terminated or revoked sooner.  Performed at [facility], [address] [place], [place] [address]   Blood culture (routine x 2)     Status: None   Collection Time: 10/16/19  5:20 AM   Specimen: BLOOD LEFT ARM  Result Value Ref Range Status   Specimen Description   Final    BLOOD LEFT ARM Performed at [place] [address], [place], [place] [address]    Special Requests   Final    BOTTLES DRAWN AEROBIC AND ANAEROBIC Blood Culture results may not be optimal due to an inadequate volume of blood received in culture bottles Performed at [place] [address] [place], [place] [address]    Culture   Final    NO GROWTH 5 DAYS Performed at [facility], [place] [address], [place], [place] [address]    Report Status  10/21/2019 FINAL  Final  Body fluid culture (includes gram stain)     Status: None   Collection  Time: 10/16/19  7:48 AM   Specimen: Pleural Fluid  Result Value Ref Range Status   Specimen Description   Final    PLEURAL Performed at [place] [address] [place], [place] [address]    Special Requests   Final    NONE Performed at [facility], [place] [address], [place], [place] [address]    Gram Stain   Final    RARE WBC PRESENT, PREDOMINANTLY MONONUCLEAR NO ORGANISMS SEEN    Culture   Final    NO GROWTH 3 DAYS Performed at [facility], [place] [address], [place], [place] [address]    Report Status 10/19/2019 FINAL  Final  Fungus Culture With Stain     Status: None (Preliminary result)   Collection Time: 10/16/19  7:48 AM   Specimen: Pleural Fluid  Result Value Ref Range Status   Fungus Stain Final report  Final    Comment: (NOTE) Performed At: [facility] [address] [place], [place] [id number] Rush Farmer MD EX:[id number]    Fungus (Mycology) Culture PENDING  Incomplete   Fungal Source PLEURAL  Final    Comment: Performed at [facility] [place] [address], [place], [place] [address]  Fungus Culture Result     Status: None   Collection Time: 10/16/19  7:48 AM  Result Value Ref Range Status   Result 1 Comment  Final    Comment: (NOTE) KOH/Calcofluor preparation:  no fungus observed. Performed At: [facility] [place], [place] [id number] Rush Farmer MD [id number]   Blood culture (routine x 2)     Status: None   Collection Time: 10/16/19  9:42 PM   Specimen: BLOOD  Result Value Ref Range Status   Specimen Description   Final    BLOOD LEFT HAND Performed at [facility], [place] [address] [place], [place] [address]    Special Requests   Final    BOTTLES DRAWN AEROBIC ONLY Blood Culture adequate volume Performed at [place] [address] [place], [place] [address]    Culture   Final    NO GROWTH 5  DAYS Performed at [facility], [place] [address] [place], [place] [address]    Report Status 10/22/2019 FINAL  Final         Radiology Studies: DG CHEST PORT 1 VIEW  Result Date: 10/23/2019 CLINICAL DATA:  Pleural effusion EXAM: PORTABLE CHEST 1 VIEW COMPARISON:  10/22/2019 FINDINGS: Left pleural catheter in the left lung base is unchanged. Progression of large left effusion. Progression of left lower lobe collapse. Small amount of aerated left upper lobe. Right lung is clear.  No infiltrate or effusion on the right. No pneumothorax. IMPRESSION: PleurX catheter on the left unchanged. Progression of large left effusion and left lower lobe collapse. Electronically Signed   By: Franchot Gallo M.D.   On: 10/23/2019 09:11   DG Chest Port 1 View  Result Date: 10/22/2019 CLINICAL DATA:  Pleural effusion. EXAM: PORTABLE CHEST 1 VIEW COMPARISON:  10/19/2019 FINDINGS: Left IJ catheter tip projects over the SVC. The left pleural effusion has decreased in volume compared with the previous exam. There is improved aeration to the left upper lobe and left midlung. The large left upper lobe lung mass is again seen. IMPRESSION: 1. Decrease in volume of left pleural effusion  and improved aeration to the left upper lobe and left midlung. 2. Stable left upper lobe lung mass. Electronically Signed   By: Kerby Moors M.D.   On: 10/22/2019 08:32        Scheduled Meds: . acetaminophen  500 mg Oral TID WC  . atenolol  100 mg Oral Daily  . docusate sodium  200 mg Oral Daily  . pulmozyme (DORNASE) for intrapleural administration  5 mg Intrapleural Once  . enoxaparin (LOVENOX) injection  40 mg Subcutaneous Q24H  . feeding supplement (ENSURE ENLIVE)  237 mL Oral TID BM  . fentaNYL  1 patch Transdermal Q72H  . insulin aspart  0-15 Units Subcutaneous Q4H  . levalbuterol  1.25 mg Nebulization BID  . mouth rinse  15 mL Mouth Rinse BID  . naproxen  250 mg Oral TID WC  . polyethylene glycol  17 g Oral Daily   . sucralfate  1 g Oral TID WC & HS   Continuous Infusions:   LOS: 7 days    Time spent: over 30 min    Fayrene Helper, MD [facility]   To contact the attending provider between 7A-7P or the covering provider during after hours 7P-7A, please log into the web site [email or web address] and access using universal [place] password for that web site. If you do not have the password, please call the hospital operator.  10/23/2019, 7:23 PM

## 2019-10-23 NOTE — Progress Notes (Signed)
Margurite Duffy Kogler   DOB:[birth year]   UX#:[id number]   GUR#:[id number]  Subjective:  Amar is now off drainage.  She denies worsening shortness of breath cough or pleurisy as compared to a day before.  She tells me she is not taking anything for pain.  She did have a bowel movement yesterday after taking some lactulose.  She had a little bit of foamy retching this morning.  I asked her if she thought she was ready to go home and she said she does not know.  Her daughter [name] was in the room.  Objective: African American woman examined in bed Vitals:   10/23/19 0543 10/23/19 0838  BP: 138/71   Pulse: (!) 102   Resp: 18   Temp: 99.3 F (37.4 C)   SpO2: 96% 97%    Body mass index is 28.57 kg/m.  Intake/Output Summary (Last 24 hours) at 10/23/2019 0916 Last data filed at 10/22/2019 1700 Gross per 24 hour  Intake 360 ml  Output --  Net 360 ml   Lungs no rales or rhonchi Heart regular rate and rhythm Abd soft, nontender, positive bowel sounds Neuro: nonfocal, well oriented, appropriate affect Breasts: Deferred    CBG (last 3)  Recent Labs    10/23/19 0426 10/23/19 0543 10/23/19 0746  GLUCAP 151* 126* 111*     Labs:  Lab Results  Component Value Date   WBC 7.4 10/23/2019   HGB 8.8 (L) 10/23/2019   HCT 26.6 (L) 10/23/2019   MCV 88.7 10/23/2019   PLT 368 10/23/2019   NEUTROABS 5.9 10/23/2019    _0 @  Urine Studies No results for input(s): UHGB, CRYS in the last 72 hours.  Invalid input(s): UACOL, UAPR, USPG, UPH, UTP, UGL, UKET, UBIL, UNIT, UROB, ULEU, UEPI, UWBC, URBC, UBAC, CAST, [place], [place]  Basic Metabolic Panel: Recent Labs  Lab 10/17/19 0519 10/18/19 2831 10/19/19 0352 10/19/19 0352 10/20/19 0405 10/20/19 0405 10/21/19 0504 10/21/19 0504 10/22/19 0342 10/23/19 0509  NA 137   < > 139  --  140  --  139  --  141 139  K 3.9   < > 4.9   < > 4.4   < > 4.0   < > 4.3 4.1  CL 102   < > 104  --  104  --  103  --  106 106  CO2 21*   < > 27  --  26   --  26  --  25 24  GLUCOSE 166*   < > 141*  --  119*  --  113*  --  100* 158*  BUN 17   < > 17  --  17  --  24*  --  31* 27*  CREATININE 0.67   < > 0.78  --  0.68  --  0.70  --  0.98 0.79  CALCIUM 9.0   < > 9.2  --  9.3  --  9.2  --  9.0 8.9  MG 1.6*   < > 1.8  --  1.7  --  2.1  --  1.9 1.6*  PHOS 3.0  --   --   --   --   --   --   --   --  2.8   < > = values in this interval not displayed.   GFR Estimated Creatinine Clearance: 82.9 mL/min (by C-G formula based on SCr of 0.79 mg/dL). Liver Function Tests: Recent Labs  Lab 10/18/19 5176 10/19/19 0352 10/20/19 0405 10/21/19 1607  10/23/19 0509  AST 32 32 41 45* 67*  ALT _0 ALKPHOS 51 56 58 58 66  BILITOT 0.3 0.5 0.5 0.5 0.6  PROT 5.7* 6.2* 6.1* 6.0* 5.7*  ALBUMIN 2.4* 2.6* 2.6* 2.6* 2.4*   No results for input(s): LIPASE, AMYLASE in the last 168 hours. No results for input(s): AMMONIA in the last 168 hours. Coagulation profile No results for input(s): INR, PROTIME in the last 168 hours.  CBC: Recent Labs  Lab 10/19/19 0352 10/19/19 0352 10/20/19 0405 10/21/19 0504 10/21/19 2010 10/22/19 0342 10/23/19 0509  WBC 8.2  --  7.8 6.0  --  7.4 7.4  NEUTROABS 5.9  --  5.9 4.3  --  5.6 5.9  HGB 9.6*   < > 9.4* 8.8* 9.3* 8.9* 8.8*  HCT 29.5*   < > 29.6* 27.4* 28.9* 28.1* 26.6*  MCV 89.9  --  91.1 91.0  --  91.2 88.7  PLT 384  --  378 372  --  390 368   < > = values in this interval not displayed.   Cardiac Enzymes: No results for input(s): CKTOTAL, CKMB, CKMBINDEX, TROPONINI in the last 168 hours. BNP: Invalid input(s): POCBNP CBG: Recent Labs  Lab 10/22/19 2119 10/23/19 0302 10/23/19 0426 10/23/19 0543 10/23/19 0746  GLUCAP 196* 179* 151* 126* 111*   D-Dimer No results for input(s): DDIMER in the last 72 hours. Hgb A1c No results for input(s): HGBA1C in the last 72 hours. Lipid Profile No results for input(s): CHOL, HDL, LDLCALC, TRIG, CHOLHDL, LDLDIRECT in the last 72 hours. Thyroid function  studies No results for input(s): TSH, T4TOTAL, T3FREE, THYROIDAB in the last 72 hours.  Invalid input(s): FREET3 Anemia work up No results for input(s): VITAMINB12, FOLATE, FERRITIN, TIBC, IRON, RETICCTPCT in the last 72 hours. Microbiology Recent Results (from the past 240 hour(s))  MRSA PCR Screening     Status: None   Collection Time: 10/13/19  9:37 AM   Specimen: Nasal Mucosa; Nasopharyngeal  Result Value Ref Range Status   MRSA by PCR NEGATIVE NEGATIVE Final    Comment:        The GeneXpert MRSA Assay (FDA approved for NASAL specimens only), is one component of a comprehensive MRSA colonization surveillance program. It is not intended to diagnose MRSA infection nor to guide or monitor treatment for MRSA infections. Performed at [facility], [place] [address] [place], [place] [address]   SARS Coronavirus 2 by RT PCR (hospital order, performed in [facility] hospital lab) Nasopharyngeal Nasopharyngeal Swab     Status: None   Collection Time: 10/16/19 12:43 AM   Specimen: Nasopharyngeal Swab  Result Value Ref Range Status   SARS Coronavirus 2 NEGATIVE NEGATIVE Final    Comment: (NOTE) SARS-CoV-2 target nucleic acids are NOT DETECTED.  The SARS-CoV-2 RNA is generally detectable in upper and lower respiratory specimens during the acute phase of infection. The lowest concentration of SARS-CoV-2 viral copies this assay can detect is 250 copies / mL. A negative result does not preclude SARS-CoV-2 infection and should not be used as the sole basis for treatment or other patient management decisions.  A negative result may occur with improper specimen collection / handling, submission of specimen other than nasopharyngeal swab, presence of viral mutation(s) within the areas targeted by this assay, and inadequate number of viral copies (<250 copies / mL). A negative result must be combined with clinical observations, patient history, and epidemiological  information.  Fact Sheet for Patients:  StrictlyIdeas.no  Fact Sheet for Healthcare Providers: BankingDealers.co.za  This test is not yet approved or  cleared by the [place] FDA and has been authorized for detection and/or diagnosis of SARS-CoV-2 by FDA under an Emergency Use Authorization (EUA).  This EUA will remain in effect (meaning this test can be used) for the duration of the COVID-19 declaration under Section 564(b)(1) of the Act, 21 U.S.C. section 360bbb-3(b)(1), unless the authorization is terminated or revoked sooner.  Performed at [facility], [address] [place], [place] [address]   Blood culture (routine x 2)     Status: None   Collection Time: 10/16/19  5:20 AM   Specimen: BLOOD LEFT ARM  Result Value Ref Range Status   Specimen Description   Final    BLOOD LEFT ARM Performed at [place] [address] [place], [place] [address]    Special Requests   Final    BOTTLES DRAWN AEROBIC AND ANAEROBIC Blood Culture results may not be optimal due to an inadequate volume of blood received in culture bottles Performed at [place] [address] [place], [place] [address]    Culture   Final    NO GROWTH 5 DAYS Performed at [facility], [place] [address] [place], [place] [address]    Report Status 10/21/2019 FINAL  Final  Body fluid culture (includes gram stain)     Status: None   Collection Time: 10/16/19  7:48 AM   Specimen: Pleural Fluid  Result Value Ref Range Status   Specimen Description   Final    PLEURAL Performed at [place] [address] [place], [place] [address]    Special Requests   Final    NONE Performed at [facility], [place] [address] [place], [place] [address]    Gram Stain   Final    RARE WBC PRESENT, PREDOMINANTLY MONONUCLEAR NO ORGANISMS SEEN    Culture   Final    NO GROWTH  3 DAYS Performed at [facility], [place] [address], [place],  [address]    Report Status 10/19/2019 FINAL  Final  Fungus Culture With Stain     Status: None (Preliminary result)   Collection Time: 10/16/19  7:48 AM   Specimen: Pleural Fluid  Result Value Ref Range Status   Fungus Stain Final report  Final    Comment: (NOTE) Performed At: [facility] [address], [place] [id number] Rush Farmer MD [id number]    Fungus (Mycology) Culture PENDING  Incomplete   Fungal Source PLEURAL  Final    Comment: Performed at [facility], [place] [address], [place],  [address]  Fungus Culture Result     Status: None   Collection Time: 10/16/19  7:48 AM  Result Value Ref Range Status   Result 1 Comment  Final    Comment: (NOTE) KOH/Calcofluor preparation:  no fungus observed. Performed At: [facility] [place], [place] [id number] Rush Farmer MD [id number]   Blood culture (routine x 2)     Status: None   Collection Time: 10/16/19  9:42 PM   Specimen: BLOOD  Result Value Ref Range Status   Specimen Description   Final    BLOOD LEFT HAND Performed at [facility], [place] [address], [place],  [address]    Special Requests   Final    BOTTLES DRAWN AEROBIC ONLY Blood Culture adequate volume Performed  at [facility], [place] [address], [place], [place] [address]    Culture   Final    NO GROWTH 5 DAYS Performed at [facility], [place] [address] [place], [place] [address]    Report Status 10/22/2019 FINAL  Final      Studies:  DG CHEST PORT 1 VIEW  Result Date: 10/23/2019 CLINICAL DATA:  Pleural effusion EXAM: PORTABLE CHEST 1 VIEW COMPARISON:  10/22/2019 FINDINGS: Left pleural catheter in the left lung base is unchanged. Progression of large left effusion. Progression of left lower lobe collapse. Small amount of aerated left upper lobe. Right lung is clear.   No infiltrate or effusion on the right. No pneumothorax. IMPRESSION: PleurX catheter on the left unchanged. Progression of large left effusion and left lower lobe collapse. Electronically Signed   By: Franchot Gallo M.D.   On: 10/23/2019 09:11   DG Chest Port 1 View  Result Date: 10/22/2019 CLINICAL DATA:  Pleural effusion. EXAM: PORTABLE CHEST 1 VIEW COMPARISON:  10/19/2019 FINDINGS: Left IJ catheter tip projects over the SVC. The left pleural effusion has decreased in volume compared with the previous exam. There is improved aeration to the left upper lobe and left midlung. The large left upper lobe lung mass is again seen. IMPRESSION: 1. Decrease in volume of left pleural effusion and improved aeration to the left upper lobe and left midlung. 2. Stable left upper lobe lung mass. Electronically Signed   By: Kerby Moors M.D.   On: 10/22/2019 08:32    Assessment: 59 y.o. [place], [place] woman status post right breast biopsy x2 on 07/10/2018 for multicentric invasive ductal carcinoma, clinically T1c N1, stage IIB, grade 3, functionally triple negative, with an MIB-1 of 70% (a) right axillary lymph node biopsied at the same time was positive  (1) neoadjuvant chemotherapy consisting of doxorubicin and cyclophosphamide in dose dense fashion x4 started 08/07/2018, completed 09/16/2018, followed by weekly Abraxane and carboplatin x12 starting 08/07/2018, stopped after 2 cycles (last dose on 10/15/2018) due to peripheral neuropathy   (2) Right breast lumpectomy on 12/27/2018 shows a ypT1b ypN0 residual invasive ductal carcinoma, margins negative. (a) 5 sentinel nodes were negative.  (b) Estrogen receptor 75% positive, weak, Progesterone receptor negative.  (3) adjuvant radiation  Radiation Treatment Dates: 02/18/2019 through 04/02/2019 Site Technique Total Dose (Gy) Dose per Fx (Gy) Completed Fx Beam Energies  Breast: Breast_Rt 3D 50.4/50.4 1.8 28/28 6X, 10X   Breast: Breast_Rt_axilla 3D 50.4/50.4 1.8 28/28 6X, 10X  Breast: Breast_Rt_Bst 3D 20/20 2 10/10 6X, 10X    (4) genetics testing 08/06/2018 through the Breast + GYN Cancers Panel offered by [facility] found no deleterious mutationsin ATM, BARD1, BRCA1, BRCA2, BRIP1, CDH1, CHEK2, DICER1, EPCAM, MLH1, MSH2, MSH6, NBN, NF1, PALB2, PMS2, PTEN, RAD50, RAD51C, RAD51D, SMARCA4, STK11, TP53.   (5) anastrozole started 05/14/2018 (a) bone density 09/22/2019 shows a T score of -0.8 (normal).  METASTATIC DISEASE: June 2021 (6)CT renalstonestudy 09/30/2019 to evaluate left flank pain shows left pleural effusion and possible liver lesions (a)cytology from the left pleural effusion 10/03/2019 confirms metastatic disease (b)liver MRI 10/04/2019 shows multiple liver lesions (c) prognostic panel requested 10/10/2019 on cytology from 10/03/2019 flui  (7) left effusion: left pleurx placement 10/13/2019  (a) loculated, not a candidate for pleurodesis per pulmonary, would need decortication  (8) pain: currently on baseline fentanyl 25 mcg/h patch  (a) naproxen/acetaminophen TID   (b) morphine PRN-- not well tolerated  (c) bowel prophylaxis--no response to miralax/colace so far   Plan:  Makenzey's situation is complex.  It appears she basically is not going to have a functioning left lung: Pulmonary did start drainage and that seem to be working but it caused a lot of pain and if that discontinued that and did not want to retry it.  Pulmonary thinks that she is not a candidate for pleurodesis and would need decortication which they do not recommend.  Accordingly she will be going home with a single functioning lung.  The family is trying to make arrangements so someone will be with her 24/7.  I asked the patient if she felt ready to go home today and she is not sure.  My suggestion was that she "pretend to be home today".  She can do everything today that she  would be doing normally at home with a little help from her family then she can be discharged tomorrow.  If she finds that she would have had to come back to the emergency room if she had been discharged today then she is not ready to go home at this point and we will have to make some other discharge plan  She has her chemotherapy pills on hand.  I have suggested that she could start those next Monday.  She already has a visit with me on Tuesday.  Of course I will see her again tomorrow morning to discuss this further.  Chauncey Cruel, MD 10/23/2019  9:16 AM Medical Oncology and Hematology [facility] [address] [place],  [address] Tel. [phone]    Fax. [phone]

## 2019-10-23 NOTE — Progress Notes (Signed)
Daily Progress Note   Patient Name: Autumn Cook       Date: 10/23/2019 DOB: 30-Oct-1960  Age: 59 y.o. MRN#: [id number] Attending Physician: Elodia Florence., * Primary Care Physician: Darreld Mclean, MD Admit Date: 10/16/2019  Reason for Consultation/Follow-up: Non pain symptom management and Pain control  Subjective: I saw and examined Autumn Cook today.  One of her daughters was present at the bedside as well.  She is lying in bed in no distress but does appear to be a little sleepier overall today.  She denies needs or concerns and reports that since her drain has not been manipulated, she has not needed any breakthrough medication since we changed it to trial of oral dilaudid.  Discussed trying dilaudid if pain worse today to see if she tolerates it better than other oral opioids.  If so, would recommend this for discharge for breakthrough pain while also continuing her currently ordered fentanyl patch.  Length of Stay: 7  Current Medications: Scheduled Meds:  . acetaminophen  500 mg Oral TID WC  . atenolol  100 mg Oral Daily  . docusate sodium  200 mg Oral Daily  . pulmozyme (DORNASE) for intrapleural administration  5 mg Intrapleural Once  . enoxaparin (LOVENOX) injection  40 mg Subcutaneous Q24H  . feeding supplement (ENSURE ENLIVE)  237 mL Oral TID BM  . fentaNYL  1 patch Transdermal Q72H  . insulin aspart  0-15 Units Subcutaneous Q4H  . levalbuterol  1.25 mg Nebulization BID  . mouth rinse  15 mL Mouth Rinse BID  . naproxen  250 mg Oral TID WC  . polyethylene glycol  17 g Oral Daily  . sucralfate  1 g Oral TID WC & HS    Continuous Infusions:   PRN Meds: HYDROmorphone, metoprolol tartrate, morphine injection, ondansetron (ZOFRAN) IV  Physical Exam       General: Awake but sleepy,  in no acute distress.  Heart: Regular rate and rhythm.  Lungs: Decreased Abdomen: Soft, non distended.  Ext: No significant edema Skin: Warm and dry      Vital Signs: BP 123/83 (BP Location: Left Arm)   Pulse 87   Temp 98.7 F (37.1 C) (Oral)   Resp 18   Ht 5\' 6"  (1.676 m)   Wt 80.3 kg   LMP  11/14/2013   SpO2 99%   BMI 28.57 kg/m  SpO2: SpO2: 99 % O2 Device: O2 Device: Nasal Cannula O2 Flow Rate: O2 Flow Rate (L/min): 2 L/min  Intake/output summary:   Intake/Output Summary (Last 24 hours) at 10/23/2019 1247 Last data filed at 10/23/2019 0900 Gross per 24 hour  Intake 360 ml  Output --  Net 360 ml   LBM: Last BM Date: 10/20/19 Baseline Weight: Weight: 80.3 kg Most recent weight: Weight: 80.3 kg       Palliative Assessment/Data:  Patient Active Problem List   Diagnosis Date Noted  . Generalized pain   . General weakness   . Palliative care by specialist   . Acute respiratory failure ([place]) 10/16/2019  . Diabetes mellitus type 2, uncontrolled, with complications ([place]) 09/60/4540  . Acute respiratory failure with hypoxia ([place]) 10/16/2019  . Pneumonia of right lower lobe due to infectious organism 10/16/2019  . Metastases to the liver ([place]) 10/10/2019  . 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 Pleural effusion on left 10/09/2019  . Type 2 diabetes mellitus with hyperglycemia ([place]) 10/09/2019  . Encounter for central line placement 08/13/2018  . Port-A-Cath in place 08/07/2018  . Family history of lung cancer   . Malignant neoplasm of overlapping sites of right breast in female, estrogen receptor positive ([place]) 07/23/2018  . Achilles tendonitis, bilateral 06/11/2015  . Chest pain with moderate risk for cardiac etiology 10/13/2014  . Hypercholesterolemia 10/13/2014  . Idiopathic scoliosis 08/18/2014  . Hypertension 09/11/2012  . Overweight (BMI 25.0-29.9) 09/11/2012  . Migraine headache 09/11/2012  . Hx of thyroid nodule 09/11/2012   . History of tobacco use 09/11/2012    Palliative Care Assessment & Plan   Patient Profile: 59 year old female with metastatic breast cancer under the care of [name].  She is undergoing chemotherapy and radiation and is status post Pleurx catheter placement with cytology revealing malignant cells.  Currently admitted for discomfort and shortness of breath and pulmonary working to increase drainage from Pleurx catheter.  Plan on discharge she is for follow-up with [name] for further treatment with capecitabine.  Palliative following for pain management.  Recommendations/Plan: -Pain:  Recommend continue fentanyl patch 25 mcg/h.  Plan is for trial of Dilaudid 2 to 4 mg p.o. every 3 hours as needed for breakthrough pain to see if it causes less nausea than oral morphine.  Over the last 24 hours, though, she has not needed any breakthrough pain medication.  Advised again regarding trying oral dilaudid if she needs breakthrough medication to see if she tolerates this better than oral morphine.  She also still has order for as needed IV morphine to be used as a second line pain medication 45 minutes after oral medication if oral medication is ineffective to relieve her pain. -Constipation: Opioid related.  Continue Miralax. If continues to have constipation long term, would titrate bowel regimen with addition of senna 2 tabs twice daily.  Goals of Care and Additional Recommendations:  Limitations on Scope of Treatment: Full Scope Treatment  Code Status:    Code Status Orders  (From admission, onward)         Start     Ordered   10/16/19 0803  Full code  Continuous        10/16/19 0808        Code Status History    Date Active Date Inactive Code Status Order ID Comments User Context   10/09/2019 2245 10/14/2019 2035 Full Code [id number]  Rise Patience, MD  Inpatient   Advance Care Planning Activity       Prognosis:   Unable to determine  Discharge Planning:  Home  with [place] was discussed with patient, daughter, RN  Thank you for allowing the Palliative Medicine Team to assist in the care of this patient.    Time In: 1110 Time Out: 1130 Total Time 20 Prolonged Time Billed No   Greater than 50%  of this time was spent counseling and coordinating care related to the above assessment and plan.  Micheline Rough, MD  Please contact Palliative Medicine Team phone at [phone] for questions and concerns.

## 2019-10-23 NOTE — Progress Notes (Signed)
PT Cancellation Note  Patient Details Name: Autumn Cook MRN: [id number] DOB: 05-07-1961   Cancelled Treatment:    Reason Eval/Treat Not Completed: Fatigue/lethargy limiting ability to participate Pt politely declines.  Daughter reports she has been performing sit to stands and also ambulating to bathroom with supervision.   Pt and daughter would like pt to mobilize into hallway however pt felt too fatigued at this time.  Provided gait belt for daughter to use when pt feels more able (since daughter has been assisting pt in room).   Tilly Pernice,KATHrine E 10/23/2019, 2:12 PM [place] PT, DPT Acute Rehabilitation Services Pager: [phone] Office: [phone]

## 2019-10-23 NOTE — Plan of Care (Signed)

## 2019-10-24 LAB — MAGNESIUM: Magnesium: 1.9 mg/dL (ref 1.7–2.4)

## 2019-10-24 LAB — CBC WITH DIFFERENTIAL/PLATELET
Abs Immature Granulocytes: 0.11 10*3/uL — ABNORMAL HIGH (ref 0.00–0.07)
Basophils Absolute: 0 10*3/uL (ref 0.0–0.1)
Basophils Relative: 0 %
Eosinophils Absolute: 0 10*3/uL (ref 0.0–0.5)
Eosinophils Relative: 0 %
HCT: 28 % — ABNORMAL LOW (ref 36.0–46.0)
Hemoglobin: 9 g/dL — ABNORMAL LOW (ref 12.0–15.0)
Immature Granulocytes: 1 %
Lymphocytes Relative: 8 %
Lymphs Abs: 0.8 10*3/uL (ref 0.7–4.0)
MCH: 28.8 pg (ref 26.0–34.0)
MCHC: 32.1 g/dL (ref 30.0–36.0)
MCV: 89.5 fL (ref 80.0–100.0)
Monocytes Absolute: 0.8 10*3/uL (ref 0.1–1.0)
Monocytes Relative: 8 %
Neutro Abs: 8.8 10*3/uL — ABNORMAL HIGH (ref 1.7–7.7)
Neutrophils Relative %: 83 %
Platelets: 381 10*3/uL (ref 150–400)
RBC: 3.13 MIL/uL — ABNORMAL LOW (ref 3.87–5.11)
RDW: 13.7 % (ref 11.5–15.5)
WBC: 10.5 10*3/uL (ref 4.0–10.5)
nRBC: 0 % (ref 0.0–0.2)

## 2019-10-24 LAB — GLUCOSE, CAPILLARY
Glucose-Capillary: 138 mg/dL — ABNORMAL HIGH (ref 70–99)
Glucose-Capillary: 146 mg/dL — ABNORMAL HIGH (ref 70–99)
Glucose-Capillary: 147 mg/dL — ABNORMAL HIGH (ref 70–99)
Glucose-Capillary: 155 mg/dL — ABNORMAL HIGH (ref 70–99)
Glucose-Capillary: 160 mg/dL — ABNORMAL HIGH (ref 70–99)
Glucose-Capillary: 177 mg/dL — ABNORMAL HIGH (ref 70–99)

## 2019-10-24 LAB — COMPREHENSIVE METABOLIC PANEL
ALT: 27 U/L (ref 0–44)
AST: 53 U/L — ABNORMAL HIGH (ref 15–41)
Albumin: 2.5 g/dL — ABNORMAL LOW (ref 3.5–5.0)
Alkaline Phosphatase: 74 U/L (ref 38–126)
Anion gap: 11 (ref 5–15)
BUN: 20 mg/dL (ref 6–20)
CO2: 23 mmol/L (ref 22–32)
Calcium: 9.1 mg/dL (ref 8.9–10.3)
Chloride: 105 mmol/L (ref 98–111)
Creatinine, Ser: 0.73 mg/dL (ref 0.44–1.00)
GFR calc Af Amer: >60 mL/min (ref >60)
GFR calc non Af Amer: >60 mL/min (ref >60)
Glucose, Bld: 140 mg/dL — ABNORMAL HIGH (ref 70–99)
Potassium: 4.6 mmol/L (ref 3.5–5.1)
Sodium: 139 mmol/L (ref 135–145)
Total Bilirubin: 0.6 mg/dL (ref 0.3–1.2)
Total Protein: 6 g/dL — ABNORMAL LOW (ref 6.5–8.1)

## 2019-10-24 LAB — PHOSPHORUS: Phosphorus: 3.6 mg/dL (ref 2.5–4.6)

## 2019-10-24 MED ORDER — PROCHLORPERAZINE MALEATE 10 MG PO TABS
5.0000 mg | ORAL_TABLET | Freq: Three times a day (TID) | ORAL | Status: DC | PRN
  Administered 2019-10-25 – 2019-10-27 (×6): 5 mg via ORAL
  Filled 2019-10-24 (×7): qty 1

## 2019-10-24 NOTE — Progress Notes (Signed)
NAME:  Autumn Cook, MRN:  [id number], DOB:  1960-06-24, LOS: 8 ADMISSION DATE:  10/16/2019, CONSULTATION DATE:  10/20/2019 REFERRING MD:  Dr. Barth Kirks, CHIEF COMPLAINT:  Pleural effusion   Brief History   59 yo female former smoker with metastatic breast cancer complicated by malignant pleural effusion.  She had pleurx catheter placed on 10/14/19, but continues to have white out of Lt hemithorax on chest xray.  PCCM asked to assess.  History of present illness   59 yo female diagnosed with multicentric invasive ductal carcinoma after having abnormal mammogram on 07/01/18.  She was found to have Rt axillary nodal involvement.  She had neoadjuvant chemotherapy with doxorubicin and cyclophosphamide from 08/07/18 to 09/06/18.  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  She had adjuvant radiation therapy to Rt breast and axilla from 1013/20 to 04/02/19.  She was started on anastrozole on 05/14/18.  She was seen by oncology on 09/30/19 and noted to have Lt flank pain, dry cough, fatigue, and chest tightness.  She was treated for possible respiratory infection and asthma exacerbation.  She had renal stone CT study.  Found to have large Lt pleural effusion and 2 hypoattenuating lesions on her liver.  She had Lt thoracentesis by IR on 10/03/19 with 950 ml fluid removed.  Fluid analysis showed malignant cells on cytology review.  Her breathing continued to get worse.  She went to hospital on 10/09/19.  She had pleurx catheter placed on 10/13/19.  She was noted to have 1800 ml of fluid removed.  However, CXR didn't show significant change.  She was discharged home on 10/14/19.    She returned to hospital on 10/16/19.  She was still feeling short of breath and had sharp chest pain.  CXR showed white out of Lt hemithorax.  Staff unable to drain fluid from pleurx catheter.  She wasn't able to get supplies at home to drain catheter before she had to come back to  ER.  Pain symptoms better since her pain medications adjusted.  Still short of breath with activity and now needing supplemental oxygen.  Not having sputum, fever, or hemoptysis.  Past Medical History  Allergies, Asthma, DM, HTN, Migraine headache  Significant Hospital Events   6/10 Admit 6/14 L PleurX flushed with 60 ml saline  Consults:  Oncology  Procedures:  Lt IJ CVL 6/10 >>   Significant Diagnostic Tests:  Lt thoracentesis 10/03/19 >> protein 5.4, LDH 1473, WBC 3118 (46% lymphocytes, 34% macrophages, 20% neutrophils), cytology positive for malignant cells from breast primary  Micro Data:  Lt pleural fluid 6/10 >>  Blood 6/10 >> negative   Antimicrobials:  Vancomycin 6/09 Cefepime 6/09 >> 6/11  Interim history/subjective:  Pt lying on left side, reports she can not breath on right side Did not take pain medications overnight and became very uncomfortable this am Afebrile   Objective   Blood pressure 125/82, pulse 91, temperature 98.2 F (36.8 C), temperature source Oral, resp. rate 18, height 5\' 6"  (1.676 m), weight 80.3 kg, last menstrual period 11/14/2013, SpO2 92 %.        Intake/Output Summary (Last 24 hours) at 10/24/2019 1005 Last data filed at 10/23/2019 1800 Gross per 24 hour  Intake 240 ml  Output --  Net 240 ml   Filed Weights   10/16/19 0431  Weight: 80.3 kg    Examination: General: adult female lying in bed in NAD, appears uncomfortable  HEENT: MM pink/moist,  [place] O2  Neuro: AAOx4, speech clear, MAE  CV: s1s2 RRR, no m/r/g PULM: non-labored on [place] O2, diminished left breath sounds, clear on right, left chest PleurX catheter in place GI: soft, bsx4 active  Extremities: warm/dry, no edema  Skin: no rashes or lesions  Assessment & Plan:   Malignant Lt pleural effusion in setting of metastatic breast cancer. -s/p pleurX catheter placement  -suspect element of trapped lung  -outpatient pulmonary follow up arranged > 7/7 at 0930 with Dr. Elsworth Soho   -bedside teaching of pleurX catheter drainage for daughter [name]) -discussed it is normal for patient to cough and have some pain / discomfort with drainage.  Reviewed they can slow the drainage down with the roller clamp if needed.  Instructed pt / daughter to not drain more than 1039ml in one session of drainage.  At first, can drain 2x per week and escalate as symptom burden increases.  Metastatic breast cancer. -Appreciate ONC input, recommending for SNF placement  -pain control per primary / palliative care, ? Consider long acting agent to prevent episodes like last night   Asthma -xopenex   Hypoxia. -O2 to support saturations >90%  Labs    CMP Latest Ref Rng & Units 10/24/2019 10/23/2019 10/22/2019  Glucose 70 - 99 mg/dL 140(H) 158(H) 100(H)  BUN 6 - 20 mg/dL 20 27(H) 31(H)  Creatinine 0.44 - 1.00 mg/dL 0.73 0.79 0.98  Sodium 135 - 145 mmol/L 139 139 141  Potassium 3.5 - 5.1 mmol/L 4.6 4.1 4.3  Chloride 98 - 111 mmol/L 105 106 106  CO2 22 - 32 mmol/L 23 24 25   Calcium 8.9 - 10.3 mg/dL 9.1 8.9 9.0  Total Protein 6.5 - 8.1 g/dL 6.0(L) 5.7(L) -  Total Bilirubin 0.3 - 1.2 mg/dL 0.6 0.6 -  Alkaline Phos 38 - 126 U/L 74 66 -  AST 15 - 41 U/L 53(H) 67(H) -  ALT 0 - 44 U/L 27 26 -    CBC Latest Ref Rng & Units 10/24/2019 10/23/2019 10/22/2019  WBC 4.0 - 10.5 K/uL 10.5 7.4 7.4  Hemoglobin 12.0 - 15.0 g/dL 9.0(L) 8.8(L) 8.9(L)  Hematocrit 36 - 46 % 28.0(L) 26.6(L) 28.1(L)  Platelets 150 - 400 K/uL 381 368 390    CBG (last 3)  Recent Labs    10/24/19 0027 10/24/19 0427 10/24/19 0737  GLUCAP 155* 138* 147*    Signature:   Noe Gens, MSN, NP-C  Pulmonary & Critical Care 10/24/2019, 10:20 AM   Please see [email or web address] for pager details.

## 2019-10-24 NOTE — Progress Notes (Signed)
Autumn Cook   DOB:11/25/1960   MB#:[id number]   GTX#:[id number]  Subjective:  Autumn Cook is lying on her left side in the bed-- same position as yesterday AM. She had an episode of chest pain (more likely chest wall pain) on the left side last night which was evaluated with an EKG. She tells me her pain is uncontrolled this AM and nurse is in room administering additional pain medication IV. Daughter Autumn Cook in room  Objective: African American woman examined in bed Vitals:   10/23/19 2306 10/24/19 0426  BP: 129/79 125/82  Pulse: 84 91  Resp: (!) 21 18  Temp: 98.8 F (37.1 C) 98.2 F (36.8 C)  SpO2: 100% 94%    Body mass index is 28.57 kg/m.  Intake/Output Summary (Last 24 hours) at 10/24/2019 0755 Last data filed at 10/23/2019 1800 Gross per 24 hour  Intake 360 ml  Output --  Net 360 ml    CBG (last 3)  Recent Labs    10/24/19 0027 10/24/19 0427 10/24/19 0737  GLUCAP 155* 138* 147*     Labs:  Lab Results  Component Value Date   WBC 10.5 10/24/2019   HGB 9.0 (L) 10/24/2019   HCT 28.0 (L) 10/24/2019   MCV 89.5 10/24/2019   PLT 381 10/24/2019   NEUTROABS 8.8 (H) 10/24/2019    _0 @  Urine Studies No results for input(s): UHGB, CRYS in the last 72 hours.  Invalid input(s): UACOL, UAPR, USPG, UPH, UTP, UGL, [place], UBIL, UNIT, UROB, [place], UEPI, UWBC, URBC, UBAC, [place], [place], [place]  Basic Metabolic Panel: Recent Labs  Lab 10/20/19 0405 10/20/19 0405 10/21/19 0504 10/21/19 0504 10/22/19 0342 10/22/19 0342 10/23/19 0509 10/24/19 0524  NA 140  --  139  --  141  --  139 139  K 4.4   < > 4.0   < > 4.3   < > 4.1 4.6  CL 104  --  103  --  106  --  106 105  CO2 26  --  26  --  25  --  24 23  GLUCOSE 119*  --  113*  --  100*  --  158* 140*  BUN 17  --  24*  --  31*  --  27* 20  CREATININE 0.68  --  0.70  --  0.98  --  0.79 0.73  CALCIUM 9.3  --  9.2  --  9.0  --  8.9 9.1  MG 1.7  --  2.1  --  1.9  --  1.6* 1.9  PHOS  --   --   --   --   --   --  2.8 3.6    < > = values in this interval not displayed.   GFR Estimated Creatinine Clearance: 82.9 mL/min (by C-G formula based on SCr of 0.73 mg/dL). Liver Function Tests: Recent Labs  Lab 10/19/19 0352 10/20/19 0405 10/21/19 0504 10/23/19 0509 10/24/19 0524  AST 32 41 45* 67* 53*  ALT _1 ALKPHOS 56 58 58 66 74  BILITOT 0.5 0.5 0.5 0.6 0.6  PROT 6.2* 6.1* 6.0* 5.7* 6.0*  ALBUMIN 2.6* 2.6* 2.6* 2.4* 2.5*   No results for input(s): LIPASE, AMYLASE in the last 168 hours. No results for input(s): AMMONIA in the last 168 hours. Coagulation profile No results for input(s): INR, PROTIME in the last 168 hours.  CBC: Recent Labs  Lab 10/20/19 0405 10/20/19 0405 10/21/19 2482 10/21/19 2010 10/22/19 0342 10/23/19 5003  10/24/19 0524  WBC 7.8  --  6.0  --  7.4 7.4 10.5  NEUTROABS 5.9  --  4.3  --  5.6 5.9 8.8*  HGB 9.4*   < > 8.8* 9.3* 8.9* 8.8* 9.0*  HCT 29.6*   < > 27.4* 28.9* 28.1* 26.6* 28.0*  MCV 91.1  --  91.0  --  91.2 88.7 89.5  PLT 378  --  372  --  390 368 381   < > = values in this interval not displayed.   Cardiac Enzymes: No results for input(s): CKTOTAL, CKMB, CKMBINDEX, TROPONINI in the last 168 hours. BNP: Invalid input(s): POCBNP CBG: Recent Labs  Lab 10/23/19 1635 10/23/19 2005 10/24/19 0027 10/24/19 0427 10/24/19 0737  GLUCAP 115* 166* 155* 138* 147*   D-Dimer No results for input(s): DDIMER in the last 72 hours. Hgb A1c No results for input(s): HGBA1C in the last 72 hours. Lipid Profile No results for input(s): CHOL, HDL, LDLCALC, TRIG, CHOLHDL, LDLDIRECT in the last 72 hours. Thyroid function studies No results for input(s): TSH, T4TOTAL, T3FREE, THYROIDAB in the last 72 hours.  Invalid input(s): FREET3 Anemia work up No results for input(s): VITAMINB12, FOLATE, FERRITIN, TIBC, IRON, RETICCTPCT in the last 72 hours. Microbiology Recent Results (from the past 240 hour(s))  SARS Coronavirus 2 by RT PCR (hospital order, performed in [facility] hospital lab) Nasopharyngeal Nasopharyngeal Swab     Status: None   Collection Time: 10/16/19 12:43 AM   Specimen: Nasopharyngeal Swab  Result Value Ref Range Status   SARS Coronavirus 2 NEGATIVE NEGATIVE Final    Comment: (NOTE) SARS-CoV-2 target nucleic acids are NOT DETECTED.  The SARS-CoV-2 RNA is generally detectable in upper and lower respiratory specimens during the acute phase of infection. The lowest concentration of SARS-CoV-2 viral copies this assay can detect is 250 copies / mL. A negative result does not preclude SARS-CoV-2 infection and should not be used as the sole basis for treatment or other patient management decisions.  A negative result may occur with improper specimen collection / handling, submission of specimen other than nasopharyngeal swab, presence of viral mutation(s) within the areas targeted by this assay, and inadequate number of viral copies (<250 copies / mL). A negative result must be combined with clinical observations, patient history, and epidemiological information.  Fact Sheet for Patients:   StrictlyIdeas.no  Fact Sheet for Healthcare Providers: BankingDealers.co.za  This test is not yet approved or  cleared by the [place] FDA and has been authorized for detection and/or diagnosis of SARS-CoV-2 by FDA under an Emergency Use Authorization (EUA).  This EUA will remain in effect (meaning this test can be used) for the duration of the COVID-19 declaration under Section 564(b)(1) of the Act, 21 U.S.C. section 360bbb-3(b)(1), unless the authorization is terminated or revoked sooner.  Performed at [facility], [address] [place], [place] [address]   Blood culture (routine x 2)     Status: None   Collection Time: 10/16/19  5:20 AM   Specimen: BLOOD LEFT ARM  Result Value Ref Range Status   Specimen Description   Final    BLOOD LEFT ARM Performed at [facility] [address] [place], [place] [address]    Special Requests   Final    BOTTLES DRAWN AEROBIC AND ANAEROBIC Blood Culture results may not be optimal due to an inadequate volume of blood received in culture bottles Performed at [facility]  [address], [place] [address]    Culture   Final    NO GROWTH 5 DAYS Performed at [facility], [place] [address] [place], [place] [address]    Report Status 10/21/2019 FINAL  Final  Body fluid culture (includes gram stain)     Status: None   Collection Time: 10/16/19  7:48 AM   Specimen: Pleural Fluid  Result Value Ref Range Status   Specimen Description   Final    PLEURAL Performed at [facility] [address] [place], [place] [address]    Special Requests   Final    NONE Performed at [facility], [place] [address] [place], [place] [address]    Gram Stain   Final    RARE WBC PRESENT, PREDOMINANTLY MONONUCLEAR NO ORGANISMS SEEN    Culture   Final    NO GROWTH 3 DAYS Performed at [facility], [place] [address], [place], [place] [address]    Report Status 10/19/2019 FINAL  Final  Fungus Culture With Stain     Status: None (Preliminary result)   Collection Time: 10/16/19  7:48 AM   Specimen: Pleural Fluid  Result Value Ref Range Status   Fungus Stain Final report  Final    Comment: (NOTE) Performed At: [facility] [address] [place], [place] [id number] Rush Farmer MD [id number]    Fungus (Mycology) Culture PENDING  Incomplete   Fungal Source PLEURAL  Final    Comment: Performed at [facility], [place] [address] [place], [place] [address]  Fungus Culture Result     Status: None   Collection Time: 10/16/19  7:48 AM  Result Value Ref Range Status   Result 1 Comment  Final    Comment: (NOTE) KOH/Calcofluor preparation:  no fungus observed. Performed At: [facility] [place], [place] [id number] Rush Farmer MD BZ:[id number]   Blood culture (routine x 2)     Status: None   Collection Time: 10/16/19  9:42 PM   Specimen: BLOOD  Result Value Ref Range Status   Specimen Description   Final    BLOOD LEFT HAND Performed at [facility], [place] [address] [place], [place] [address]    Special Requests   Final    BOTTLES DRAWN AEROBIC ONLY Blood Culture adequate volume Performed at [facility] [address] [place], [place] [address]    Culture   Final    NO GROWTH 5 DAYS Performed at [facility], [place] [address], [place],  [address]    Report Status 10/22/2019 FINAL  Final      Studies:  DG CHEST PORT 1 VIEW  Result Date: 10/23/2019 CLINICAL DATA:  Pleural effusion EXAM: PORTABLE CHEST 1 VIEW COMPARISON:  10/22/2019 FINDINGS: Left pleural catheter in the left lung base is unchanged. Progression of large left effusion. Progression of left lower lobe collapse. Small amount of aerated left upper lobe. Right lung is clear.  No infiltrate or effusion on the right. No pneumothorax. IMPRESSION: PleurX catheter on the left unchanged. Progression of large left effusion and left lower lobe collapse. Electronically Signed   By: Franchot Gallo M.D.   On: 10/23/2019 09:11    Assessment: 58 y.o. [place], [place] woman status post right breast biopsy x2 on 07/10/2018 for multicentric invasive ductal carcinoma, clinically T1c N1, stage IIB, grade 3, functionally triple negative, with an MIB-1 of 70% (a) right axillary lymph node biopsied at the  same time was positive  (1) neoadjuvant chemotherapy consisting of doxorubicin and cyclophosphamide in dose dense fashion x4 started 08/07/2018, completed 09/16/2018, followed by weekly Abraxane and carboplatin x12 starting 08/07/2018, stopped after 2 cycles (last dose on 10/15/2018) due to peripheral neuropathy   (2) Right breast lumpectomy on 12/27/2018 shows a  ypT1b ypN0 residual invasive ductal carcinoma, margins negative. (a) 5 sentinel nodes were negative.  (b) Estrogen receptor 75% positive, weak, Progesterone receptor negative.  (3) adjuvant radiation  Radiation Treatment Dates: 02/18/2019 through 04/02/2019 Site Technique Total Dose (Gy) Dose per Fx (Gy) Completed Fx Beam Energies  Breast: Breast_Rt 3D 50.4/50.4 1.8 28/28 6X, 10X  Breast: Breast_Rt_axilla 3D 50.4/50.4 1.8 28/28 6X, 10X  Breast: Breast_Rt_Bst 3D 20/20 2 10/10 6X, 10X    (4) genetics testing 08/06/2018 through the Breast + GYN Cancers Panel offered by [facility] found no deleterious mutationsin ATM, BARD1, BRCA1, BRCA2, BRIP1, CDH1, CHEK2, DICER1, EPCAM, MLH1, MSH2, MSH6, NBN, NF1, PALB2, PMS2, PTEN, RAD50, RAD51C, RAD51D, SMARCA4, STK11, TP53.   (5) anastrozole started 05/14/2018 (a) bone density 09/22/2019 shows a T score of -0.8 (normal).  METASTATIC DISEASE: June 2021 (6)CT renalstonestudy 09/30/2019 to evaluate left flank pain shows left pleural effusion and possible liver lesions (a)cytology from the left pleural effusion 10/03/2019 confirms metastatic disease (b)liver MRI 10/04/2019 shows multiple liver lesions (c) prognostic panel requested 10/10/2019 on cytology from 10/03/2019 flui  (7) left effusion: left pleurx placement 10/13/2019  (a) loculated, not a candidate for pleurodesis per pulmonary, would need decortication  (8) pain: currently on baseline fentanyl 25 mcg/h patch  (a) breakthrough opiates and naproxen/tylenol  (b) bowel prophylaxis--no response to miralax/colace so far   Plan:  Autumn Cook tells me if she had been home last night with her left chest (wall) pain she would have gone straight to the ED. In addition she does not feel her baseline pain is well-controlled on current meds. She does not think her nausea is controlled on zofran and demands compazine (written).  While  we are not actively treating her cancer or effusion at present, I think if she went home today she would be back in the ED within 24-48 hours. That may be the best we can do for now, but I think a better plan may be for her to go to a rehab/SNF facility where she can be immediately evaluated and can learn which of the intermittent and ongoing pains she is experiencing due to her cancer warrant emergent evaluation and which are chronic. There are also issues with pain medication side-effects and nausea management.  Family and patient tell me this AM they are agreeable to SNF placement. If that is not possible for insurance/financial/availability or other reasons then we can continue to work at developing a pain/nausea/bowel prophylaxis regimen the patient and family understand and can follow at home and when that is in place she can be discharged.   I am on call this weekend and will follow with you. Appreciate your and the Palliative Care team's help to this patient and her family!  Chauncey Cruel, MD 10/24/2019  7:55 AM Medical Oncology and Hematology [facility] [address] [place], [place] [address] Tel. [phone]    Fax. [phone]

## 2019-10-24 NOTE — Progress Notes (Signed)
Left pleurX catheter dressing removed, sterile technique utilized.  Left chest drained 550 ml bloody appearing pleural fluid.  Patient did not have pain with drainage but had significant difficulty laying on right side during drainage.  New dressing applied.   Demonstrated procedure for daughter at bedside.  Questions answered.     Noe Gens, MSN, NP-C [facility] 10/24/2019, 10:29 AM   Please see [email or web address] for pager details.

## 2019-10-24 NOTE — Plan of Care (Signed)

## 2019-10-24 NOTE — TOC Progression Note (Signed)
Transition of Care [facility]) - Progression Note    Patient Details  Name: Autumn Cook MRN: [id number] Date of Birth: Feb 27, 1961  [facility] CM/SW Contact  Tony Friscia, Juliann Pulse, RN Phone Number: 10/24/2019, 10:22 AM  Clinical Narrative: CM referral for SNF-will need PT to assess recc for SNF.Patient agreed to work w/PT.Informed patient still unable to have a [facility] agency to accept-informed supv has contacted her insurance also about the difficulty to find a [place] agency for [facility] pleurx drain care.Informed nsg to order pleurx drain cannisters for home.     Expected Discharge Plan: [place] Barriers to Discharge: No [facility] will accept this patient  Expected Discharge Plan and Services Expected Discharge Plan: [place]   Discharge Planning Services: CM Consult   Living arrangements for the past 2 months: Single Family Home Expected Discharge Date:  (unknown)                                     Social Determinants of Health (SDOH) Interventions    Readmission Risk Interventions No flowsheet data found.

## 2019-10-24 NOTE — Progress Notes (Addendum)
Patient C/o of mid chest pressure rating 7/10. No additional symptoms. 12 lead EKG completed- unremarkable. VSS. Gave prn pain medication. Made On call attending aware via [place]. No orders given. Will cont to monitor.   Reassessed patient CP. Patient has no complaints of CP at this time. Will cont to monitor.

## 2019-10-24 NOTE — Progress Notes (Signed)
PROGRESS NOTE    Autumn Cook  KGU:[id number] DOB: 27-Feb-1961 DOA: 10/16/2019 PCP: Darreld Mclean, MD   Chief Complaint  Patient presents with  . Shortness of Breath  . Chest Pain    Brief Narrative:  Patient is Autumn Cook 59 year old female with history of right breast cancer, hypertension, hyperlipidemia, type 2 diabetes mellitus, asthma who presents to the emergency department with complaints of central sharp chest pain as well as shortness of breath. She was recently admitted here and discharged 2 days prior to presentation when she underwent Pleurx catheter placement by PCCM after she was found to have large left pleural effusion secondary to her breast cancer. Patient did not know how to use the Pleurx catheter at home. On presentation, chest x-ray showed complete white out of the left lung but there was also suspicion for new consolidations on the right side concerning for pneumonia for which she was started on antibiotics.  Assessment & Plan:   Active Problems:   Hypertension   History of tobacco use   Hypercholesterolemia   Malignant neoplasm of overlapping sites of right breast in female, estrogen receptor positive ([place])   Encounter for central line placement   Malignant pleural effusion   Recurrent left pleural effusion   Metastases to the liver (HCC)   Acute respiratory failure (HCC)   Diabetes mellitus type 2, uncontrolled, with complications (HCC)   Acute respiratory failure with hypoxia (HCC)   Pneumonia of right lower lobe due to infectious organism   Generalized pain   General weakness   Palliative care by specialist  Acute on chronic respiratory failure with hypoxia: Secondary to left-sided extensivemalignantpleural effusion. She is on 2 L of oxygen at home at baseline. She was recently admitted here and underwent left-sided Pleurx catheter but she did not know how to operate it at home. - Chest tube clamped per PCCM, resume 2x weekly drainage - has outpatient  pulm f/u 7/8 at 0930 with Geraldo Pitter  - they've reviewed drainage with pt - pulm suspects element of trapped lung, won't be candidate for pleurodesis due to this - note it may be beneficial to start therapy for metastatic breast cancer as this may help control accumulation of pleural fluid -> planning to start monday - CXR 6/15 with decrease in volume of L pleural effusion and improved aeration to the LUL and left midlung - CXR 6/17 with pleurx catheter on L and progression of large L effusion and left lower lobe collapse - Oncology following, appreciate recommendations - will look into possible SNF if possible  - Palliative care following for pain control, appreciate assistance, she had rough night last night from pain  Suspected HCAP:CXR onpresentation showed possible consolidations on the right side.She is not febrile or septic. She wasstarted on cefepime by admitting physician.Antibiotics have been stopped as there was very low suspicion for pneumonia. Procalcitonin also was negative.  Chest Pain: occurred last night, EKG with NSR, resolved this AM, suspected related to her pleural effusion above  Malignantneoplasm of the right breast: [name] is following. Notstarted on chemotherapy yet.  Hypertension: Blood pressure stable at this time.  Continue atenolol.  Continue to monitor and adjust medications as needed.  Diabetes mellitus: A1c of 7.8. Continue sliding scaleinsulin for now.She is not on any medications at home. Consider Metformin 500 mg twice Autumn Cook day on discharge.  Hypomagnesemia: Replace mag.  Potassium stable.  Goals of care:Palliative care consulted for pain management, goals of care.  Pain control- appreciate palliative care  Left IJ - d/c'd  DVT prophylaxis: (lovenox Code Status: full  Family Communication: daughter at bedside Disposition:   Status is: Inpatient  Remains inpatient appropriate because:Inpatient level of care appropriate  due to severity of illness   Dispo: The patient is from: Home              Anticipated d/c is to: Home              Anticipated d/c date is: 2 days              Patient currently is not medically stable to d/c.   Consultants:   PCCM  Pulm  Oncology  Procedures:  none  Antimicrobials: Anti-infectives (From admission, onward)   Start     Dose/Rate Route Frequency Ordered Stop   10/16/19 1400  ceFEPIme (MAXIPIME) 2 g in sodium chloride 0.9 % 100 mL IVPB  Status:  Discontinued        2 g 200 mL/hr over 30 Minutes Intravenous Every 8 hours 10/16/19 0817 10/17/19 1426   10/16/19 0530  vancomycin (VANCOCIN) IVPB 1000 mg/200 mL premix        1,000 mg 200 mL/hr over 60 Minutes Intravenous  Once 10/16/19 0520 10/16/19 0741   10/16/19 0530  ceFEPIme (MAXIPIME) 2 g in sodium chloride 0.9 % 100 mL IVPB        2 g 200 mL/hr over 30 Minutes Intravenous  Once 10/16/19 0520 10/16/19 0741      Subjective: Rough night last night, pain  Objective: Vitals:   10/23/19 2306 10/24/19 0426 10/24/19 0942 10/24/19 1259  BP: 129/79 125/82  102/74  Pulse: 84 91  80  Resp: (!) 21 18  17   Temp: 98.8 F (37.1 C) 98.2 F (36.8 C)  98.2 F (36.8 C)  TempSrc: Oral Oral  Oral  SpO2: 100% 94% 92% 97%  Weight:      Height:        Intake/Output Summary (Last 24 hours) at 10/24/2019 1610 Last data filed at 10/24/2019 1218 Gross per 24 hour  Intake 360 ml  Output 1100 ml  Net -740 ml   Filed Weights   10/16/19 0431  Weight: 80.3 kg    Examination:  General: No acute distress. Cardiovascular: Heart sounds show Autumn Cook regular rate, and rhythm Lungs: decreased L sided breath sounds, pleurx Abdomen: Soft, nontender, nondistended  Neurological: Alert and oriented 3. Moves all extremities 4. Cranial nerves II through XII grossly intact. Skin: Warm and dry. No rashes or lesions. Extremities: No clubbing or cyanosis. No edema.    Data Reviewed: I have personally reviewed following labs and  imaging studies  CBC: Recent Labs  Lab 10/20/19 0405 10/20/19 0405 10/21/19 0504 10/21/19 2010 10/22/19 0342 10/23/19 0509 10/24/19 0524  WBC 7.8  --  6.0  --  7.4 7.4 10.5  NEUTROABS 5.9  --  4.3  --  5.6 5.9 8.8*  HGB 9.4*   < > 8.8* 9.3* 8.9* 8.8* 9.0*  HCT 29.6*   < > 27.4* 28.9* 28.1* 26.6* 28.0*  MCV 91.1  --  91.0  --  91.2 88.7 89.5  PLT 378  --  372  --  390 368 381   < > = values in this interval not displayed.    Basic Metabolic Panel: Recent Labs  Lab 10/20/19 0405 10/21/19 0504 10/22/19 0342 10/23/19 0509 10/24/19 0524  NA 140 139 141 139 139  K 4.4 4.0 4.3 4.1 4.6  CL 104 103 106 106  105  CO2 26 26 25 24 23   GLUCOSE 119* 113* 100* 158* 140*  BUN 17 24* 31* 27* 20  CREATININE 0.68 0.70 0.98 0.79 0.73  CALCIUM 9.3 9.2 9.0 8.9 9.1  MG 1.7 2.1 1.9 1.6* 1.9  PHOS  --   --   --  2.8 3.6    GFR: Estimated Creatinine Clearance: 82.9 mL/min (by C-G formula based on SCr of 0.73 mg/dL).  Liver Function Tests: Recent Labs  Lab 10/19/19 0352 10/20/19 0405 10/21/19 0504 10/23/19 0509 10/24/19 0524  AST 32 41 45* 67* 53*  ALT 20 23 23 26 27   ALKPHOS 56 58 58 66 74  BILITOT 0.5 0.5 0.5 0.6 0.6  PROT 6.2* 6.1* 6.0* 5.7* 6.0*  ALBUMIN 2.6* 2.6* 2.6* 2.4* 2.5*    CBG: Recent Labs  Lab 10/23/19 2005 10/24/19 0027 10/24/19 0427 10/24/19 0737 10/24/19 1132  GLUCAP 166* 155* 138* 147* 146*     Recent Results (from the past 240 hour(s))  SARS Coronavirus 2 by RT PCR (hospital order, performed in [facility] hospital lab) Nasopharyngeal Nasopharyngeal Swab     Status: None   Collection Time: 10/16/19 12:43 AM   Specimen: Nasopharyngeal Swab  Result Value Ref Range Status   SARS Coronavirus 2 NEGATIVE NEGATIVE Final    Comment: (NOTE) SARS-CoV-2 target nucleic acids are NOT DETECTED.  The SARS-CoV-2 RNA is generally detectable in upper and lower respiratory specimens during the acute phase of infection. The lowest concentration of SARS-CoV-2  viral copies this assay can detect is 250 copies / mL. Autumn Cook negative result does not preclude SARS-CoV-2 infection and should not be used as the sole basis for treatment or other patient management decisions.  Autumn Cook negative result may occur with improper specimen collection / handling, submission of specimen other than nasopharyngeal swab, presence of viral mutation(s) within the areas targeted by this assay, and inadequate number of viral copies (<250 copies / mL). Autumn Cook negative result must be combined with clinical observations, patient history, and epidemiological information.  Fact Sheet for Patients:   StrictlyIdeas.no  Fact Sheet for Healthcare Providers: BankingDealers.co.za  This test is not yet approved or  cleared by the [place] FDA and has been authorized for detection and/or diagnosis of SARS-CoV-2 by FDA under an Emergency Use Authorization (EUA).  This EUA will remain in effect (meaning this test can be used) for the duration of the COVID-19 declaration under Section 564(b)(1) of the Act, 21 U.S.C. section 360bbb-3(b)(1), unless the authorization is terminated or revoked sooner.  Performed at [facility], [address] [place], [place] [address]   Blood culture (routine x 2)     Status: None   Collection Time: 10/16/19  5:20 AM   Specimen: BLOOD LEFT ARM  Result Value Ref Range Status   Specimen Description   Final    BLOOD LEFT ARM Performed at [place] [address] [place], [place] [address]    Special Requests   Final    BOTTLES DRAWN AEROBIC AND ANAEROBIC Blood Culture results may not be optimal due to an inadequate volume of blood received in culture bottles Performed at [place] [address], [place], [place] [address]    Culture   Final    NO GROWTH 5 DAYS Performed at [facility], [place] [address] [place], [place] [address]    Report  Status 10/21/2019 FINAL  Final  Body fluid culture (includes gram stain)  Status: None   Collection Time: 10/16/19  7:48 AM   Specimen: Pleural Fluid  Result Value Ref Range Status   Specimen Description   Final    PLEURAL Performed at [place] [address], [place], [place] [address]    Special Requests   Final    NONE Performed at [facility], [place] [address], [place], [place] [address]    Gram Stain   Final    RARE WBC PRESENT, PREDOMINANTLY MONONUCLEAR NO ORGANISMS SEEN    Culture   Final    NO GROWTH 3 DAYS Performed at [facility], [place] [address] [place], [place] [address]    Report Status 10/19/2019 FINAL  Final  Fungus Culture With Stain     Status: None (Preliminary result)   Collection Time: 10/16/19  7:48 AM   Specimen: Pleural Fluid  Result Value Ref Range Status   Fungus Stain Final report  Final    Comment: (NOTE) Performed At: [facility] [address] [place], [place] [id number] Rush Farmer MD [id number]    Fungus (Mycology) Culture PENDING  Incomplete   Fungal Source PLEURAL  Final    Comment: Performed at [facility], [place] [address], [place], [place] [address]  Fungus Culture Result     Status: None   Collection Time: 10/16/19  7:48 AM  Result Value Ref Range Status   Result 1 Comment  Final    Comment: (NOTE) KOH/Calcofluor preparation:  no fungus observed. Performed At: [facility] [place], [place] [id number] Rush Farmer MD [id number]   Blood culture (routine x 2)     Status: None   Collection Time: 10/16/19  9:42 PM   Specimen: BLOOD  Result Value Ref Range Status   Specimen Description   Final    BLOOD LEFT HAND Performed at [facility], [place] [address], [place], [place] [address]    Special Requests   Final    BOTTLES DRAWN AEROBIC ONLY Blood Culture adequate volume Performed at [place] [address] [place], [place] [address]    Culture   Final    NO GROWTH 5 DAYS Performed at [facility], [place] [address], [place], [place] [address]    Report Status 10/22/2019 FINAL  Final         Radiology Studies: DG CHEST PORT 1 VIEW  Result Date: 10/23/2019 CLINICAL DATA:  Pleural effusion EXAM: PORTABLE CHEST 1 VIEW COMPARISON:  10/22/2019 FINDINGS: Left pleural catheter in the left lung base is unchanged. Progression of large left effusion. Progression of left lower lobe collapse. Small amount of aerated left upper lobe. Right lung is clear.  No infiltrate or effusion on the right. No pneumothorax. IMPRESSION: PleurX catheter on the left unchanged. Progression of large left effusion and left lower lobe collapse. Electronically Signed   By: Franchot Gallo M.D.   On: 10/23/2019 09:11        Scheduled Meds: . acetaminophen  500 mg Oral TID WC  . atenolol  100 mg Oral Daily  . docusate sodium  200 mg Oral Daily  . pulmozyme (DORNASE) for intrapleural administration  5 mg Intrapleural Once  . enoxaparin (LOVENOX) injection  40 mg Subcutaneous Q24H  . feeding supplement (ENSURE ENLIVE)  237 mL Oral TID BM  . fentaNYL  1 patch Transdermal Q72H  . insulin aspart  0-15 Units Subcutaneous Q4H  . levalbuterol  1.25 mg Nebulization BID  .  mouth rinse  15 mL Mouth Rinse BID  . naproxen  250 mg Oral TID WC  . polyethylene glycol  17 g Oral Daily  . sucralfate  1 g Oral TID WC & HS   Continuous Infusions:   LOS: 8 days    Time spent: over 30 min    Fayrene Helper, MD [facility]   To contact the attending provider between 7A-7P or the covering provider during after hours 7P-7A, please log into the web site [email or web address] and access using universal [place] password for that web site. If you do not have the password, please call the hospital operator.  10/24/2019, 4:10 PM

## 2019-10-24 NOTE — TOC Progression Note (Signed)
Transition of Care [facility]) - Progression Note    Patient Details  Name: RAECHELL SINGLETON MRN: [id number] Date of Birth: [birth year]  [facility]) CM/SW Contact  Ottilia Pippenger, Juliann Pulse, RN Phone Number: 10/24/2019, 5:48 PM  Clinical Narrative:Received call from private duty care agency-rep Aaron Edelman through [facility] will provide nursing services for patient-they need HHRN orders w/specific care services to send to insurance for auth,they do not provide HHPT-informed patient-voiced understanding.Will await PT to assess if higher level needed-patient is agreeable if recc.       Expected Discharge Plan: [place] Barriers to Discharge: [place] will accept this patient  Expected Discharge Plan and Services Expected Discharge Plan: [place]   Discharge Planning Services: CM Consult   Living arrangements for the past 2 months: Single Family Home Expected Discharge Date:  (unknown)                                     Social Determinants of Health (SDOH) Interventions    Readmission Risk Interventions No flowsheet data found.

## 2019-10-25 LAB — GLUCOSE, CAPILLARY
Glucose-Capillary: 110 mg/dL — ABNORMAL HIGH (ref 70–99)
Glucose-Capillary: 116 mg/dL — ABNORMAL HIGH (ref 70–99)
Glucose-Capillary: 134 mg/dL — ABNORMAL HIGH (ref 70–99)
Glucose-Capillary: 136 mg/dL — ABNORMAL HIGH (ref 70–99)
Glucose-Capillary: 145 mg/dL — ABNORMAL HIGH (ref 70–99)
Glucose-Capillary: 169 mg/dL — ABNORMAL HIGH (ref 70–99)
Glucose-Capillary: 98 mg/dL (ref 70–99)

## 2019-10-25 LAB — BASIC METABOLIC PANEL
Anion gap: 11 (ref 5–15)
BUN: 20 mg/dL (ref 6–20)
CO2: 25 mmol/L (ref 22–32)
Calcium: 8.9 mg/dL (ref 8.9–10.3)
Chloride: 101 mmol/L (ref 98–111)
Creatinine, Ser: 0.72 mg/dL (ref 0.44–1.00)
GFR calc Af Amer: >60 mL/min (ref >60)
GFR calc non Af Amer: >60 mL/min (ref >60)
Glucose, Bld: 119 mg/dL — ABNORMAL HIGH (ref 70–99)
Potassium: 4.4 mmol/L (ref 3.5–5.1)
Sodium: 137 mmol/L (ref 135–145)

## 2019-10-25 LAB — CBC WITH DIFFERENTIAL/PLATELET
Abs Immature Granulocytes: 0.09 10*3/uL — ABNORMAL HIGH (ref 0.00–0.07)
Basophils Absolute: 0 10*3/uL (ref 0.0–0.1)
Basophils Relative: 0 %
Eosinophils Absolute: 0.1 10*3/uL (ref 0.0–0.5)
Eosinophils Relative: 1 %
HCT: 25.6 % — ABNORMAL LOW (ref 36.0–46.0)
Hemoglobin: 8.3 g/dL — ABNORMAL LOW (ref 12.0–15.0)
Immature Granulocytes: 1 %
Lymphocytes Relative: 11 %
Lymphs Abs: 0.9 10*3/uL (ref 0.7–4.0)
MCH: 28.7 pg (ref 26.0–34.0)
MCHC: 32.4 g/dL (ref 30.0–36.0)
MCV: 88.6 fL (ref 80.0–100.0)
Monocytes Absolute: 0.7 10*3/uL (ref 0.1–1.0)
Monocytes Relative: 9 %
Neutro Abs: 6.5 10*3/uL (ref 1.7–7.7)
Neutrophils Relative %: 78 %
Platelets: 402 10*3/uL — ABNORMAL HIGH (ref 150–400)
RBC: 2.89 MIL/uL — ABNORMAL LOW (ref 3.87–5.11)
RDW: 13.5 % (ref 11.5–15.5)
WBC: 8.4 10*3/uL (ref 4.0–10.5)
nRBC: 0 % (ref 0.0–0.2)

## 2019-10-25 LAB — MAGNESIUM: Magnesium: 1.7 mg/dL (ref 1.7–2.4)

## 2019-10-25 MED ORDER — IPRATROPIUM-ALBUTEROL 0.5-2.5 (3) MG/3ML IN SOLN
3.0000 mL | RESPIRATORY_TRACT | Status: DC | PRN
  Administered 2019-10-29 – 2019-10-31 (×4): 3 mL via RESPIRATORY_TRACT
  Filled 2019-10-25 (×4): qty 3

## 2019-10-25 NOTE — Progress Notes (Signed)
Daily Progress Note   Patient Name: Autumn Cook       Date: 10/25/2019 DOB: 1960/07/09  Age: 59 y.o. MRN#: [id number] Attending Physician: Elodia Florence., * Primary Care Physician: Darreld Mclean, MD Admit Date: 10/16/2019  Reason for Consultation/Follow-up: Non pain symptom management and Pain control  Subjective: I saw and examined Autumn Cook today.  Daughter and RN at bedside at time of my encounter.  Reports having better night last night but did receive IV medication x 2.  We reviewed plan for  Trying oral medication throughout the day today as she needs to transition off IV medications in order to be able to leave the hospital.  We reviewed plan to trial the 4 mg oral dose of Dilaudid as soon as she begins to have pain to see if this works to get ahead of the pain and keep it under control.  Daughter at bedside understands and also reminded Autumn Cook that we need to pretreat prior to activities that cause pain.  Length of Stay: 9  Current Medications: Scheduled Meds:   acetaminophen  500 mg Oral TID WC   atenolol  100 mg Oral Daily   docusate sodium  200 mg Oral Daily   pulmozyme (DORNASE) for intrapleural administration  5 mg Intrapleural Once   enoxaparin (LOVENOX) injection  40 mg Subcutaneous Q24H   feeding supplement (ENSURE ENLIVE)  237 mL Oral TID BM   fentaNYL  1 patch Transdermal Q72H   insulin aspart  0-15 Units Subcutaneous Q4H   levalbuterol  1.25 mg Nebulization BID   mouth rinse  15 mL Mouth Rinse BID   naproxen  250 mg Oral TID WC   polyethylene glycol  17 g Oral Daily   sucralfate  1 g Oral TID WC & HS    Continuous Infusions:   PRN Meds: HYDROmorphone, metoprolol tartrate, morphine injection, ondansetron (ZOFRAN) IV,  prochlorperazine  Physical Exam     General: Awake,  in no acute distress.  Heart: Regular rate and rhythm.  Lungs: Decreased Abdomen: Soft, non distended.  Ext: No significant edema Skin: Warm and dry      Vital Signs: BP 104/73 (BP Location: Left Arm)    Pulse 94    Temp 98.7 F (37.1 C) (Oral)    Resp 14  Ht 5\' 6"  (1.676 m)    Wt 80.3 kg    LMP 11/14/2013    SpO2 93%    BMI 28.57 kg/m  SpO2: SpO2: 93 % O2 Device: O2 Device: Nasal Cannula O2 Flow Rate: O2 Flow Rate (L/min): 2 L/min  Intake/output summary:   Intake/Output Summary (Last 24 hours) at 10/25/2019 0901 Last data filed at 10/25/2019 0441 Gross per 24 hour  Intake 240 ml  Output 3 ml  Net 237 ml   LBM: Last BM Date: 10/23/19 Baseline Weight: Weight: 80.3 kg Most recent weight: Weight: 80.3 kg       Palliative Assessment/Data:  Patient Active Problem List   Diagnosis Date Noted   Generalized pain    General weakness    Palliative care by specialist    Acute respiratory failure ([place]) 10/16/2019   Diabetes mellitus type 2, uncontrolled, with complications ([place]) 85/46/2703   Acute respiratory failure with hypoxia ([place]) 10/16/2019   Pneumonia of right lower lobe due to infectious organism 10/16/2019   Metastases to the liver ([place]) 10/10/2019   Malignant pleural effusion 10/09/2019   Pleural effusion on left 10/09/2019   Type 2 diabetes mellitus with hyperglycemia ([place]) 10/09/2019   Encounter for central line placement 08/13/2018   Port-A-Cath in place 08/07/2018   Family history of lung cancer    Malignant neoplasm of overlapping sites of right breast in female, estrogen receptor positive (Autumn Cook) 07/23/2018   Achilles tendonitis, bilateral 06/11/2015   Chest pain with moderate risk for cardiac etiology 10/13/2014   Hypercholesterolemia 10/13/2014   Idiopathic scoliosis 08/18/2014   Hypertension 09/11/2012   Overweight (BMI 25.0-29.9) 09/11/2012   Migraine headache 09/11/2012   Hx  of thyroid nodule 09/11/2012   History of tobacco use 09/11/2012    Palliative Care Assessment & Plan   Patient Profile: 59 year old female with metastatic breast cancer under the care of [name].  She is undergoing chemotherapy and radiation and is status post Pleurx catheter placement with cytology revealing malignant cells.  Currently admitted for discomfort and shortness of breath and pulmonary working to increase drainage from Pleurx catheter.  Plan on discharge she is for follow-up with [name] for further treatment with capecitabine.  Palliative following for pain management.  Recommendations/Plan: -Pain:  Recommend continue fentanyl patch 25 mcg/h.  Discussed plan to use primary rescue medication of Dilaudid 4 mg on an as-needed basis but emphasized need to take medication as soon as she begins to have pain rather than waiting as it does take longer for the onset of relief with oral medication versus IV.  We also discussed need to pretreat prior to activities that are going to cause pain. -Constipation: Opioid related.  Continue Miralax. If continues to have constipation long term, would titrate bowel regimen with addition of senna 2 tabs twice daily.  Goals of Care and Additional Recommendations:  Limitations on Scope of Treatment: Full Scope Treatment  Code Status:    Code Status Orders  (From admission, onward)         Start     Ordered   10/16/19 0803  Full code  Continuous        10/16/19 0808        Code Status History    Date Active Date Inactive Code Status Order ID Comments User Context   10/09/2019 2245 10/14/2019 2035 Full Code [id number]  Rise Patience, MD Inpatient   Advance Care Planning Activity       Prognosis:   Unable to determine  Discharge Planning:  Home with Home Health vs SNF for rehab  Care plan was discussed with patient, RN  Thank you for allowing the Palliative Medicine Team to assist in the care of this  patient.    Total Time 20 Prolonged Time Billed No   Greater than 50%  of this time was spent counseling and coordinating care related to the above assessment and plan.  Micheline Rough, MD  Please contact Palliative Medicine Team phone at [phone] for questions and concerns.

## 2019-10-25 NOTE — Progress Notes (Signed)
Daily Progress Note   Patient Name: Autumn Cook       Date: 10/25/2019 DOB: February 11, 1961  Age: 59 y.o. MRN#: [id number] Attending Physician: Elodia Florence., * Primary Care Physician: Darreld Mclean, MD Admit Date: 10/16/2019  Reason for Consultation/Follow-up: Non pain symptom management and Pain control  Subjective: I saw and examined Ms. Desanto today.  No family present at the bedside at time of my encounter.  She was sitting in bedside chair at time of my visit.  She reports having a "terrible" night last night.  Reports the pain came on suddenly and was partially relieved with oral Dilaudid but she feels that oral medication does not work quickly enough to relieve her pain.  She reports that Dilaudid took her pain from 8-10 out of 10 to around a 5.  It did not make her as nauseous as oral morphine did.  I spent a lot of time discussing with her use of oral pain medications for breakthrough pain and the need to take these as soon as she feels the pain is worsening rather than waiting.  I also discussed with her need to pretreat prior to events that caused pain to worsen.  Her nurse was at the bedside at time of my encounter and was active and encouraging her to request medicine when she begins to have pain rather than waiting until the pain is out of control.  We reviewed plan to trial the 4 mg oral dose of Dilaudid as soon as she begins to have pain to see if this works to get ahead of the pain and keep it under control.  We also discussed potential for increasing fentanyl patch, but with her pain being sudden onset and then going away incident type pain, I am concerned about her being overmedicated and more sleepy if we increase her long-acting agent.  Length of Stay:  9  Current Medications: Scheduled Meds:   acetaminophen  500 mg Oral TID WC   atenolol  100 mg Oral Daily   docusate sodium  200 mg Oral Daily   pulmozyme (DORNASE) for intrapleural administration  5 mg Intrapleural Once   enoxaparin (LOVENOX) injection  40 mg Subcutaneous Q24H   feeding supplement (ENSURE ENLIVE)  237 mL Oral TID BM   fentaNYL  1 patch Transdermal Q72H  insulin aspart  0-15 Units Subcutaneous Q4H   levalbuterol  1.25 mg Nebulization BID   mouth rinse  15 mL Mouth Rinse BID   naproxen  250 mg Oral TID WC   polyethylene glycol  17 g Oral Daily   sucralfate  1 g Oral TID WC & HS    Continuous Infusions:   PRN Meds: HYDROmorphone, metoprolol tartrate, morphine injection, ondansetron (ZOFRAN) IV, prochlorperazine  Physical Exam     General: Awake but sleepy,  in no acute distress.  Heart: Regular rate and rhythm.  Lungs: Decreased Abdomen: Soft, non distended.  Ext: No significant edema Skin: Warm and dry      Vital Signs: BP 104/73 (BP Location: Left Arm)    Pulse 94    Temp 98.7 F (37.1 C) (Oral)    Resp 14    Ht 5\' 6"  (1.676 m)    Wt 80.3 kg    LMP 11/14/2013    SpO2 93%    BMI 28.57 kg/m  SpO2: SpO2: 93 % O2 Device: O2 Device: Nasal Cannula O2 Flow Rate: O2 Flow Rate (L/min): 2 L/min  Intake/output summary:   Intake/Output Summary (Last 24 hours) at 10/25/2019 0746 Last data filed at 10/25/2019 0441 Gross per 24 hour  Intake 360 ml  Output 1103 ml  Net -743 ml   LBM: Last BM Date: 10/23/19 Baseline Weight: Weight: 80.3 kg Most recent weight: Weight: 80.3 kg       Palliative Assessment/Data:  Patient Active Problem List   Diagnosis Date Noted   Generalized pain    General weakness    Palliative care by specialist    Acute respiratory failure ([place]) 10/16/2019   Diabetes mellitus type 2, uncontrolled, with complications ([place]) 62/22/9798   Acute respiratory failure with hypoxia ([place]) 10/16/2019   Pneumonia of right  lower lobe due to infectious organism 10/16/2019   Metastases to the liver ([place]) 10/10/2019   Malignant pleural effusion 10/09/2019   Pleural effusion on left 10/09/2019   Type 2 diabetes mellitus with hyperglycemia ([place]) 10/09/2019   Encounter for central line placement 08/13/2018   Port-A-Cath in place 08/07/2018   Family history of lung cancer    Malignant neoplasm of overlapping sites of right breast in female, estrogen receptor positive ([place]) 07/23/2018   Achilles tendonitis, bilateral 06/11/2015   Chest pain with moderate risk for cardiac etiology 10/13/2014   Hypercholesterolemia 10/13/2014   Idiopathic scoliosis 08/18/2014   Hypertension 09/11/2012   Overweight (BMI 25.0-29.9) 09/11/2012   Migraine headache 09/11/2012   Hx of thyroid nodule 09/11/2012   History of tobacco use 09/11/2012    Palliative Care Assessment & Plan   Patient Profile: 59 year old female with metastatic breast cancer under the care of [name].  She is undergoing chemotherapy and radiation and is status post Pleurx catheter placement with cytology revealing malignant cells.  Currently admitted for discomfort and shortness of breath and pulmonary working to increase drainage from Pleurx catheter.  Plan on discharge she is for follow-up with [name] for further treatment with capecitabine.  Palliative following for pain management.  Recommendations/Plan: -Pain:  Recommend continue fentanyl patch 25 mcg/h.  She did not need any rescue medications in the 24 hours prior to my visit with her yesterday.  Over the past 24 hours, she has had increase in periods of pain and has required the equivalent of 34 mg of oral morphine and oral and IV rescue medications.  She tells me that it is not that the oral Dilaudid  does not work, however, it does not work as quickly as IV pain medication to relieve her pain.  Could consider increasing her fentanyl patch, however, this would be an increase to 37.5  mcg which would require 2 patches to be placed at all times and with her description of pain being quick onset and going away incident type pain, I would worry about her having too much medication round-the-clock for breakthrough pain that is episodic in nature.  We will certainly continue to monitor her usage and if she continues to increase her round-the-clock opioid usage increasing her fentanyl patch would be the most appropriate next step.  At this point, however, I encouraged her to utilize Dilaudid 4 mg on an as-needed basis but emphasized need to take medication as soon as she begins to have pain rather than waiting as it does take longer for the onset of relief with oral medication versus IV.  We also discussed need to pretreat prior to activities that are going to cause pain. -Constipation: Opioid related.  Continue Miralax. If continues to have constipation long term, would titrate bowel regimen with addition of senna 2 tabs twice daily.  Goals of Care and Additional Recommendations:  Limitations on Scope of Treatment: Full Scope Treatment  Code Status:    Code Status Orders  (From admission, onward)         Start     Ordered   10/16/19 0803  Full code  Continuous        10/16/19 0808        Code Status History    Date Active Date Inactive Code Status Order ID Comments User Context   10/09/2019 2245 10/14/2019 2035 Full Code [id number]  Rise Patience, MD Inpatient   Advance Care Planning Activity       Prognosis:   Unable to determine  Discharge Planning:  Home with [facility] vs SNF for rehab  Care plan was discussed with patient, RN  Thank you for allowing the Palliative Medicine Team to assist in the care of this patient.    Total Time 25 Prolonged Time Billed No   Greater than 50%  of this time was spent counseling and coordinating care related to the above assessment and plan.  Micheline Rough, MD  Please contact Palliative Medicine Team phone at  [phone] for questions and concerns.

## 2019-10-25 NOTE — Plan of Care (Signed)

## 2019-10-25 NOTE — Progress Notes (Signed)
Patient suffers from dizziness with walking which impairs their ability to perform daily activities like walking in the home.  A walker alone will not resolve the issues with performing activities of daily living. A lightweight wheelchair will allow patient to safely perform daily activities.  The patient can self propel in the home or has a caregiver who can provide assistance.     Blondell Reveal Kistler PT 10/25/2019  Acute Rehabilitation Services Pager [phone] Office [phone]

## 2019-10-25 NOTE — Progress Notes (Addendum)
PROGRESS NOTE    Autumn Cook  YPP:[id number] DOB: 1960/07/23 DOA: 10/16/2019 PCP: Darreld Mclean, MD   Chief Complaint  Patient presents with  . Shortness of Breath  . Chest Pain    Brief Narrative:  Patient is Autumn Cook 59 year old female with history of right breast cancer, hypertension, hyperlipidemia, type 2 diabetes mellitus, asthma who presents to the emergency department with complaints of central sharp chest pain as well as shortness of breath. She was recently admitted here and discharged 2 days prior to presentation when she underwent Pleurx catheter placement by PCCM after she was found to have large left pleural effusion secondary to her breast cancer. Patient did not know how to use the Pleurx catheter at home. On presentation, chest x-ray showed complete white out of the left lung but there was also suspicion for new consolidations on the right side concerning for pneumonia for which she was started on antibiotics.  Assessment & Plan:   Active Problems:   Hypertension   History of tobacco use   Hypercholesterolemia   Malignant neoplasm of overlapping sites of right breast in female, estrogen receptor positive ([place])   Encounter for central line placement   Malignant pleural effusion   Pleural effusion on left   Metastases to the liver [facility])   Acute respiratory failure (HCC)   Diabetes mellitus type 2, uncontrolled, with complications (HCC)   Acute respiratory failure with hypoxia (HCC)   Pneumonia of right lower lobe due to infectious organism   Generalized pain   General weakness   Palliative care by specialist  Acute on chronic respiratory failure with hypoxia: Secondary to left-sided extensivemalignantpleural effusion. She is on 2 L of oxygen at home at baseline. She was recently admitted here and underwent left-sided Pleurx catheter but she did not know how to operate it at home. - Chest tube clamped per PCCM, resume 2x weekly drainage - has outpatient pulm  f/u 7/8 at 0930 with Geraldo Pitter  - they've reviewed drainage with pt - pulm suspects element of trapped lung, won't be candidate for pleurodesis due to this - note it may be beneficial to start therapy for metastatic breast cancer as this may help control accumulation of pleural fluid -> planning to start monday - CXR 6/15 with decrease in volume of L pleural effusion and improved aeration to the LUL and left midlung - CXR 6/17 with pleurx catheter on L and progression of large L effusion and left lower lobe collapse - Oncology following, appreciate recommendations - will look into possible SNF if possible - repeat eval by therapy today -> recommending HH PT, wheelchair for longer distances as pt ambulating 16' - Palliative care following for pain control, appreciate assistance, she had rough night last night from pain  Dizziness: ? If related to pain meds which we planned for her to have before working with therapy.  Will follow and see if she has improvement when she stands again. Addendum: pt got up later and symptoms resolved  Suspected HCAP:CXR onpresentation showed possible consolidations on the right side.She is not febrile or septic. She wasstarted on cefepime by admitting physician.Antibiotics have been stopped as there was very low suspicion for pneumonia. Procalcitonin also was negative.  Chest Pain: occurred 6/17-18 PM, EKG with NSR, resolved and has not recurred, suspected related to her pleural effusion above  Malignantneoplasm of the right breast: [name] is following. Notstarted on chemotherapy yet.  Hypertension: Blood pressure stable at this time.  Continue atenolol.  Continue to  monitor and adjust medications as needed.  Diabetes mellitus: A1c of 7.8. Continue sliding scaleinsulin for now.She is not on any medications at home. Consider Metformin 500 mg twice [place] Wachter day on discharge.  Hypomagnesemia: Replace mag.  Potassium stable.  Goals of  care:Palliative care consulted for pain management, goals of care.  Pain control- appreciate palliative care  Left IJ - d/c'd  DVT prophylaxis: (lovenox Code Status: full  Family Communication: daughter at bedside Disposition:   Status is: Inpatient  Remains inpatient appropriate because:Inpatient level of care appropriate due to severity of illness   Dispo: The patient is from: Home              Anticipated d/c is to: Home              Anticipated d/c date is: 2 days              Patient currently is not medically stable to d/c.   Consultants:   PCCM  Pulm  Oncology  Procedures:  none  Antimicrobials: Anti-infectives (From admission, onward)   Start     Dose/Rate Route Frequency Ordered Stop   10/16/19 1400  ceFEPIme (MAXIPIME) 2 g in sodium chloride 0.9 % 100 mL IVPB  Status:  Discontinued        2 g 200 mL/hr over 30 Minutes Intravenous Every 8 hours 10/16/19 0817 10/17/19 1426   10/16/19 0530  vancomycin (VANCOCIN) IVPB 1000 mg/200 mL premix        1,000 mg 200 mL/hr over 60 Minutes Intravenous  Once 10/16/19 0520 10/16/19 0741   10/16/19 0530  ceFEPIme (MAXIPIME) 2 g in sodium chloride 0.9 % 100 mL IVPB        2 g 200 mL/hr over 30 Minutes Intravenous  Once 10/16/19 0520 10/16/19 0741      Subjective: Working on using pain meds before pain is overwhelming  Objective: Vitals:   10/24/19 2012 10/24/19 2133 10/25/19 0559 10/25/19 1315  BP: 128/68  104/73   Pulse: 94  94   Resp:   14   Temp: 99.2 F (37.3 C)  98.7 F (37.1 C)   TempSrc: Oral  Oral   SpO2: 92% 92% 93% 93%  Weight:      Height:        Intake/Output Summary (Last 24 hours) at 10/25/2019 1547 Last data filed at 10/25/2019 0850 Gross per 24 hour  Intake 600 ml  Output 3 ml  Net 597 ml   Filed Weights   10/16/19 0431  Weight: 80.3 kg    Examination:  General: No acute distress. Cardiovascular: Heart sounds show Meenakshi Sazama regular rate, and rhythm. Lungs: L sided pleurx, decreased  breath sounds on L Abdomen: Soft, nontender, nondistended Neurological: Alert and oriented 3. Moves all extremities 4. Cranial nerves II through XII grossly intact. Skin: Warm and dry. No rashes or lesions. Extremities: No clubbing or cyanosis. No edema.   Data Reviewed: I have personally reviewed following labs and imaging studies  CBC: Recent Labs  Lab 10/21/19 0504 10/21/19 0504 10/21/19 2010 10/22/19 0342 10/23/19 0509 10/24/19 0524 10/25/19 0606  WBC 6.0  --   --  7.4 7.4 10.5 8.4  NEUTROABS 4.3  --   --  5.6 5.9 8.8* 6.5  HGB 8.8*   < > 9.3* 8.9* 8.8* 9.0* 8.3*  HCT 27.4*   < > 28.9* 28.1* 26.6* 28.0* 25.6*  MCV 91.0  --   --  91.2 88.7 89.5 88.6  PLT 372  --   --  390 368 381 402*   < > = values in this interval not displayed.    Basic Metabolic Panel: Recent Labs  Lab 10/21/19 0504 10/22/19 0342 10/23/19 0509 10/24/19 0524 10/25/19 0606 10/25/19 0616  NA 139 141 139 139  --  137  K 4.0 4.3 4.1 4.6  --  4.4  CL 103 106 106 105  --  101  CO2 26 25 24 23   --  25  GLUCOSE 113* 100* 158* 140*  --  119*  BUN 24* 31* 27* 20  --  20  CREATININE 0.70 0.98 0.79 0.73  --  0.72  CALCIUM 9.2 9.0 8.9 9.1  --  8.9  MG 2.1 1.9 1.6* 1.9 1.7  --   PHOS  --   --  2.8 3.6  --   --     GFR: Estimated Creatinine Clearance: 82.9 mL/min (by C-G formula based on SCr of 0.72 mg/dL).  Liver Function Tests: Recent Labs  Lab 10/19/19 0352 10/20/19 0405 10/21/19 0504 10/23/19 0509 10/24/19 0524  AST 32 41 45* 67* 53*  ALT 20 23 23 26 27   ALKPHOS 56 58 58 66 74  BILITOT 0.5 0.5 0.5 0.6 0.6  PROT 6.2* 6.1* 6.0* 5.7* 6.0*  ALBUMIN 2.6* 2.6* 2.6* 2.4* 2.5*    CBG: Recent Labs  Lab 10/24/19 2054 10/25/19 0033 10/25/19 0407 10/25/19 0728 10/25/19 1127  GLUCAP 160* 110* 136* 98 145*     Recent Results (from the past 240 hour(s))  SARS Coronavirus 2 by RT PCR (hospital order, performed in [facility] hospital lab) Nasopharyngeal Nasopharyngeal Swab     Status:  None   Collection Time: 10/16/19 12:43 AM   Specimen: Nasopharyngeal Swab  Result Value Ref Range Status   SARS Coronavirus 2 NEGATIVE NEGATIVE Final    Comment: (NOTE) SARS-CoV-2 target nucleic acids are NOT DETECTED.  The SARS-CoV-2 RNA is generally detectable in upper and lower respiratory specimens during the acute phase of infection. The lowest concentration of SARS-CoV-2 viral copies this assay can detect is 250 copies / mL. Chadley Dziedzic negative result does not preclude SARS-CoV-2 infection and should not be used as the sole basis for treatment or other patient management decisions.  Kassondra Geil negative result may occur with improper specimen collection / handling, submission of specimen other than nasopharyngeal swab, presence of viral mutation(s) within the areas targeted by this assay, and inadequate number of viral copies (<250 copies / mL). Albirta Rhinehart negative result must be combined with clinical observations, patient history, and epidemiological information.  Fact Sheet for Patients:   StrictlyIdeas.no  Fact Sheet for Healthcare Providers: BankingDealers.co.za  This test is not yet approved or  cleared by the [place] FDA and has been authorized for detection and/or diagnosis of SARS-CoV-2 by FDA under an Emergency Use Authorization (EUA).  This EUA will remain in effect (meaning this test can be used) for the duration of the COVID-19 declaration under Section 564(b)(1) of the Act, 21 U.S.C. section 360bbb-3(b)(1), unless the authorization is terminated or revoked sooner.  Performed at [facility], [address] [place], [place] [address]   Blood culture (routine x 2)     Status: None   Collection Time: 10/16/19  5:20 AM   Specimen: BLOOD LEFT ARM  Result Value Ref Range Status   Specimen Description   Final    BLOOD LEFT ARM Performed at [facility] [address] [place], [place] [address]     Special Requests  Final    BOTTLES DRAWN AEROBIC AND ANAEROBIC Blood Culture results may not be optimal due to an inadequate volume of blood received in culture bottles Performed at [facility], [place] [address], [place], [place] [address]    Culture   Final    NO GROWTH 5 DAYS Performed at [facility], [place] [address], [place], [place] [address]    Report Status 10/21/2019 FINAL  Final  Body fluid culture (includes gram stain)     Status: None   Collection Time: 10/16/19  7:48 AM   Specimen: Pleural Fluid  Result Value Ref Range Status   Specimen Description   Final    PLEURAL Performed at [facility] [address] [place], [place] [address]    Special Requests   Final    NONE Performed at [facility], [place] [address] [place], [place] [address]    Gram Stain   Final    RARE WBC PRESENT, PREDOMINANTLY MONONUCLEAR NO ORGANISMS SEEN    Culture   Final    NO GROWTH 3 DAYS Performed at [facility], [place] [address], [place], [place] [address]    Report Status 10/19/2019 FINAL  Final  Fungus Culture With Stain     Status: None (Preliminary result)   Collection Time: 10/16/19  7:48 AM   Specimen: Pleural Fluid  Result Value Ref Range Status   Fungus Stain Final report  Final    Comment: (NOTE) Performed At: [facility] [address], [place] [id number] Rush Farmer MD AS:[id number]    Fungus (Mycology) Culture PENDING  Incomplete   Fungal Source PLEURAL  Final    Comment: Performed at [facility], [place] [address], [place], [place] [address]  Fungus Culture Result     Status: None   Collection Time: 10/16/19  7:48 AM  Result Value Ref Range Status   Result 1 Comment  Final    Comment: (NOTE) KOH/Calcofluor preparation:  no fungus observed. Performed At: [facility] [place], [place] [id number] Rush Farmer MD KG:[id number]   Blood  culture (routine x 2)     Status: None   Collection Time: 10/16/19  9:42 PM   Specimen: BLOOD  Result Value Ref Range Status   Specimen Description   Final    BLOOD LEFT HAND Performed at [facility], [place] [address] [place], [place] [address]    Special Requests   Final    BOTTLES DRAWN AEROBIC ONLY Blood Culture adequate volume Performed at [facility] [address] [place], [place] [address]    Culture   Final    NO GROWTH 5 DAYS Performed at [facility], [place] [address], [place], [place] [address]    Report Status 10/22/2019 FINAL  Final         Radiology Studies: No results found.      Scheduled Meds: . acetaminophen  500 mg Oral TID WC  . atenolol  100 mg Oral Daily  . docusate sodium  200 mg Oral Daily  . pulmozyme (DORNASE) for intrapleural administration  5 mg Intrapleural Once  . enoxaparin (LOVENOX) injection  40 mg Subcutaneous Q24H  . feeding supplement (ENSURE ENLIVE)  237 mL Oral TID BM  . fentaNYL  1 patch Transdermal Q72H  . insulin aspart  0-15 Units Subcutaneous Q4H  . mouth rinse  15 mL Mouth Rinse BID  . naproxen  250 mg  Oral TID WC  . polyethylene glycol  17 g Oral Daily  . sucralfate  1 g Oral TID WC & HS   Continuous Infusions:   LOS: 9 days    Time spent: over 30 min    Fayrene Helper, MD [facility]   To contact the attending provider between 7A-7P or the covering provider during after hours 7P-7A, please log into the web site [email or web address] and access using universal [place] password for that web site. If you do not have the password, please call the hospital operator.  10/25/2019, 3:47 PM

## 2019-10-25 NOTE — Progress Notes (Signed)
Patient and daughter [name] was shown how to hook up and unhook  pleur X catheter for intermittent draining using sterile technique by Noe Gens NP. [name] also watched a video on this procedure. Daughter felt comfortable with doing the draining procedure.

## 2019-10-25 NOTE — Progress Notes (Addendum)
Physical Therapy Treatment Patient Details Name: Autumn Cook MRN: [id number] DOB: Dec 26, 1960 Today's Date: 10/25/2019    History of Present Illness 59 yo female former smoker with metastatic breast cancer complicated by malignant pleural effusion.  She had pleurx catheter placed on 10/14/19    PT Comments    Pt ambulated from bed to bathroom, then back to bed without an assistive device. She was unable to tolerate further ambulation 2* dizziness and pain. She was unable to tolerate standing long enough to obtain standing BP. Instructed pt in LE strengthening exercises to be done in bed. WC recommended for longer distances as pt is only ambulating ~16' at present. Pt and her daughter stated they feel they can manage pt's care at home.    Follow Up Recommendations  Supervision for mobility/OOB;Home health PT     Equipment Recommendations  Rolling walker with 5" wheels;3in1 (PT);Wheelchair (measurements PT);Wheelchair cushion (measurements PT)    Recommendations for Other Services       Precautions / Restrictions Precautions Precautions: Fall Precaution Comments: monitor O2 Restrictions Weight Bearing Restrictions: No    Mobility  Bed Mobility Overal bed mobility: Modified Independent Bed Mobility: Supine to Sit;Sit to Supine     Supine to sit: Modified independent (Device/Increase time);HOB elevated Sit to supine: Modified independent (Device/Increase time);HOB elevated   General bed mobility comments: used rail  Transfers Overall transfer level: Needs assistance Equipment used: None Transfers: Sit to/from Stand Sit to Stand: Min guard         General transfer comment: min/guard for safety  Ambulation/Gait Ambulation/Gait assistance: Min guard Gait Distance (Feet): 16 Feet Assistive device: None Gait Pattern/deviations: Step-through pattern;Decreased stride length Gait velocity: decr   General Gait Details: pt ambulated to/from bathroom; too fatigued and  dizzy after using bathroom to ambulate in hallway, BP sitting 126/70, SaO2 93% on room air, pt unable to tolerate standing long enough to obtain standing BP   Stairs             Wheelchair Mobility    Modified Rankin (Stroke Patients Only)       Balance Overall balance assessment: Needs assistance   Sitting balance-Leahy Scale: Good     Standing balance support: No upper extremity supported Standing balance-Leahy Scale: Good                              Cognition Arousal/Alertness: Awake/alert Behavior During Therapy: WFL for tasks assessed/performed Overall Cognitive Status: Within Functional Limits for tasks assessed                                        Exercises General Exercises - Lower Extremity Ankle Circles/Pumps: AROM;Both;10 reps;Supine Quad Sets:  (demonstrated quad sets to pt, too fatigued to attempt them) Gluteal Sets:  (demonstrated gluteal sets to pt; she was too fatigued to attempt them)    General Comments        Pertinent Vitals/Pain Pain Score: 5  Pain Location: L lower back in standing Pain Descriptors / Indicators: Discomfort;Grimacing    Home Living                      Prior Function            PT Goals (current goals can now be found in the care plan section) Acute Rehab PT Goals Patient Stated  Goal: to go home, to be able to walk farther PT Goal Formulation: With patient/family Time For Goal Achievement: 10/28/19 Potential to Achieve Goals: Good Progress towards PT goals: Progressing toward goals    Frequency    Min 3X/week      PT Plan Current plan remains appropriate    Co-evaluation              AM-PAC PT "6 Clicks" Mobility   Outcome Measure  Help needed turning from your back to your side while in a flat bed without using bedrails?: A Little Help needed moving from lying on your back to sitting on the side of a flat bed without using bedrails?: A Little Help  needed moving to and from a bed to a chair (including a wheelchair)?: A Little Help needed standing up from a chair using your arms (e.g., wheelchair or bedside chair)?: A Little Help needed to walk in hospital room?: A Little Help needed climbing 3-5 steps with a railing? : A Little 6 Click Score: 18    End of Session Equipment Utilized During Treatment: Gait belt Activity Tolerance: Patient limited by fatigue;Treatment limited secondary to medical complications (Comment) (dizziness) Patient left: in bed;with call bell/phone within reach;with family/visitor present Nurse Communication: Mobility status PT Visit Diagnosis: Pain Pain - Right/Left: Left     Time: 1253-1310 PT Time Calculation (min) (ACUTE ONLY): 17 min  Charges:  $Gait Training: 8-22 mins                     Blondell Reveal Kistler PT 10/25/2019  Acute Rehabilitation Services Pager [phone] Office [phone]

## 2019-10-26 ENCOUNTER — Encounter: Payer: Self-pay | Admitting: Family Medicine

## 2019-10-26 DIAGNOSIS — E1169 Type 2 diabetes mellitus with other specified complication: Secondary | ICD-10-CM

## 2019-10-26 DIAGNOSIS — R0602 Shortness of breath: Secondary | ICD-10-CM

## 2019-10-26 DIAGNOSIS — K5900 Constipation, unspecified: Secondary | ICD-10-CM

## 2019-10-26 LAB — CBC WITH DIFFERENTIAL/PLATELET
Abs Immature Granulocytes: 0.11 10*3/uL — ABNORMAL HIGH (ref 0.00–0.07)
Basophils Absolute: 0 10*3/uL (ref 0.0–0.1)
Basophils Relative: 0 %
Eosinophils Absolute: 0.1 10*3/uL (ref 0.0–0.5)
Eosinophils Relative: 1 %
HCT: 26.7 % — ABNORMAL LOW (ref 36.0–46.0)
Hemoglobin: 8.7 g/dL — ABNORMAL LOW (ref 12.0–15.0)
Immature Granulocytes: 1 %
Lymphocytes Relative: 8 %
Lymphs Abs: 0.8 10*3/uL (ref 0.7–4.0)
MCH: 29.4 pg (ref 26.0–34.0)
MCHC: 32.6 g/dL (ref 30.0–36.0)
MCV: 90.2 fL (ref 80.0–100.0)
Monocytes Absolute: 0.7 10*3/uL (ref 0.1–1.0)
Monocytes Relative: 7 %
Neutro Abs: 7.8 10*3/uL — ABNORMAL HIGH (ref 1.7–7.7)
Neutrophils Relative %: 83 %
Platelets: 433 10*3/uL — ABNORMAL HIGH (ref 150–400)
RBC: 2.96 MIL/uL — ABNORMAL LOW (ref 3.87–5.11)
RDW: 14.2 % (ref 11.5–15.5)
WBC: 9.5 10*3/uL (ref 4.0–10.5)
nRBC: 0 % (ref 0.0–0.2)

## 2019-10-26 LAB — COMPREHENSIVE METABOLIC PANEL
ALT: 28 U/L (ref 0–44)
AST: 50 U/L — ABNORMAL HIGH (ref 15–41)
Albumin: 2.4 g/dL — ABNORMAL LOW (ref 3.5–5.0)
Alkaline Phosphatase: 75 U/L (ref 38–126)
Anion gap: 8 (ref 5–15)
BUN: 18 mg/dL (ref 6–20)
CO2: 25 mmol/L (ref 22–32)
Calcium: 9.1 mg/dL (ref 8.9–10.3)
Chloride: 104 mmol/L (ref 98–111)
Creatinine, Ser: 0.7 mg/dL (ref 0.44–1.00)
GFR calc Af Amer: >60 mL/min (ref >60)
GFR calc non Af Amer: >60 mL/min (ref >60)
Glucose, Bld: 151 mg/dL — ABNORMAL HIGH (ref 70–99)
Potassium: 4.2 mmol/L (ref 3.5–5.1)
Sodium: 137 mmol/L (ref 135–145)
Total Bilirubin: 0.3 mg/dL (ref 0.3–1.2)
Total Protein: 6.1 g/dL — ABNORMAL LOW (ref 6.5–8.1)

## 2019-10-26 LAB — GLUCOSE, CAPILLARY
Glucose-Capillary: 156 mg/dL — ABNORMAL HIGH (ref 70–99)
Glucose-Capillary: 110 mg/dL — ABNORMAL HIGH (ref 70–99)
Glucose-Capillary: 133 mg/dL — ABNORMAL HIGH (ref 70–99)
Glucose-Capillary: 141 mg/dL — ABNORMAL HIGH (ref 70–99)
Glucose-Capillary: 122 mg/dL — ABNORMAL HIGH (ref 70–99)

## 2019-10-26 LAB — MAGNESIUM: Magnesium: 1.6 mg/dL — ABNORMAL LOW (ref 1.7–2.4)

## 2019-10-26 LAB — PHOSPHORUS: Phosphorus: 3.7 mg/dL (ref 2.5–4.6)

## 2019-10-26 MED ORDER — PROMETHAZINE HCL 25 MG PO TABS
12.5000 mg | ORAL_TABLET | Freq: Once | ORAL | Status: AC | PRN
  Administered 2019-10-26: 12.5 mg via ORAL
  Filled 2019-10-26: qty 1

## 2019-10-26 MED ORDER — VENLAFAXINE HCL ER 37.5 MG PO CP24
37.5000 mg | ORAL_CAPSULE | ORAL | Status: DC
  Administered 2019-10-26 – 2019-10-29 (×4): 37.5 mg via ORAL
  Filled 2019-10-26 (×5): qty 1

## 2019-10-26 MED ORDER — ACETAMINOPHEN 500 MG PO TABS
500.0000 mg | ORAL_TABLET | Freq: Once | ORAL | Status: AC
  Administered 2019-10-26: 500 mg via ORAL
  Filled 2019-10-26: qty 1

## 2019-10-26 MED ORDER — MORPHINE SULFATE 10 MG/5ML PO SOLN: 5.0000 mg | ORAL | Status: DC | PRN

## 2019-10-26 MED ORDER — HYDROCORTISONE 1 % EX CREA
TOPICAL_CREAM | Freq: Two times a day (BID) | CUTANEOUS | Status: DC
  Administered 2019-11-12: 1 via TOPICAL
  Filled 2019-10-26: qty 28

## 2019-10-26 MED ORDER — LORAZEPAM 2 MG/ML IJ SOLN
0.2500 mg | Freq: Once | INTRAMUSCULAR | Status: AC | PRN
  Administered 2019-10-26: 0.25 mg via INTRAVENOUS
  Filled 2019-10-26: qty 1

## 2019-10-26 MED ORDER — MORPHINE SULFATE 10 MG/5ML PO SOLN
5.0000 mg | ORAL | Status: DC | PRN
  Administered 2019-10-26 – 2019-10-27 (×5): 10 mg via ORAL
  Filled 2019-10-26 (×6): qty 5

## 2019-10-26 MED ORDER — SENNA 8.6 MG PO TABS
2.0000 | ORAL_TABLET | Freq: Every day | ORAL | Status: DC
  Administered 2019-10-26: 17.2 mg via ORAL
  Filled 2019-10-26: qty 2

## 2019-10-26 MED ORDER — SORBITOL 70 % SOLN
30.0000 mL | Freq: Every day | Status: DC | PRN
  Filled 2019-10-26: qty 30

## 2019-10-26 NOTE — Plan of Care (Signed)
  Problem: Clinical Measurements: Goal: Ability to maintain clinical measurements within normal limits will improve Outcome: Progressing Goal: Will remain free from infection Outcome: Progressing Goal: Diagnostic test results will improve Outcome: Progressing Goal: Respiratory complications will improve Outcome: Progressing Goal: Cardiovascular complication will be avoided Outcome: Progressing   Problem: Coping: Goal: Level of anxiety will decrease Outcome: Progressing   Problem: Elimination: Goal: Will not experience complications related to urinary retention Outcome: Progressing   Problem: Pain Managment: Goal: General experience of comfort will improve Outcome: Progressing   Problem: Safety: Goal: Ability to remain free from injury will improve Outcome: Progressing   Problem: Skin Integrity: Goal: Risk for impaired skin integrity will decrease Outcome: Progressing   

## 2019-10-26 NOTE — Progress Notes (Signed)
Daily Progress Note   Patient Name: Autumn Cook       Date: 10/26/2019 DOB: [birth year]  Age: 59 y.o. MRN#: [id number] Attending Physician: Elodia Florence., * Primary Care Physician: Darreld Mclean, MD Admit Date: 10/16/2019  Reason for Consultation/Follow-up: Non pain symptom management and Pain control  Subjective: I saw and examined Autumn Cook today.  Her daughter was at the bedside on my encounter.  She reports trying oral Dilaudid yesterday and last night and it made her feel dizzy and sick to her stomach.  We discussed options for short acting medications moving forward including morphine, oxycodone, Dilaudid, hydrocodone, and her daughter states that her sister's husband takes tramadol and they are wondering about this is an option as well.  We talked about each of these and the fact that Autumn Cook has tried each of them in the past.  She feels that both her Dilaudid and oxycodone make her very sick and the best she has done with rescue medicine since this admission has been with the oral morphine.  At the time she was taking oral morphine, she was also taking naproxen which has now been stopped.  She wants to know if the oral morphine may be something that she can tolerate now that she is not taking the naproxen concurrently.  We talked about her overall opioid usage of the last 24 hours which included 8 mg of IV morphine and 6 mg of oral Dilaudid for a total oral morphine equivalent of 48 mg.  Discussed options moving forward of starting back on oral morphine, trial of tramadol (chart review reveals she has had this in the past but not during this admission), or possibly uptitrating fentanyl patch.  We discussed up titration of patch and concerned that her pain is now  intermittent in nature and uptitrating patch may cause her to be sleepier as it would be an increase in medication around the clock rather than an as-needed basis.  If the oral morphine is not improving her symptoms though, I certainly am open to idea of uptitrate her fentanyl patch to 37.5 mcg/h to see how she does.  We also discussed her bowel regimen.  Has been 2 days since her last bowel movement.  She reports that if she had a cup of coffee she felt that  she could go to the bathroom.  She does drink decaf coffee at home to help with her bowel movements.  I got her a cup of decaf coffee per her request.  Length of Stay: 10  Current Medications: Scheduled Meds:  . acetaminophen  500 mg Oral TID WC  . atenolol  100 mg Oral Daily  . pulmozyme (DORNASE) for intrapleural administration  5 mg Intrapleural Once  . enoxaparin (LOVENOX) injection  40 mg Subcutaneous Q24H  . feeding supplement (ENSURE ENLIVE)  237 mL Oral TID BM  . fentaNYL  1 patch Transdermal Q72H  . insulin aspart  0-15 Units Subcutaneous Q4H  . mouth rinse  15 mL Mouth Rinse BID  . naproxen  250 mg Oral TID WC  . polyethylene glycol  17 g Oral Daily  . senna  2 tablet Oral QHS  . sucralfate  1 g Oral TID WC & HS   PRN Meds: ipratropium-albuterol, metoprolol tartrate, morphine, morphine injection, ondansetron (ZOFRAN) IV, prochlorperazine  Physical Exam     General: Awake,  in no acute distress.  Heart: Regular rate and rhythm.  Lungs: Decreased Abdomen: Soft, non distended.  Ext: No significant edema Skin: Warm and dry      Vital Signs: BP (!) 148/85 (BP Location: Left Arm)   Pulse 90   Temp 98.2 F (36.8 C) (Oral)   Resp 16   Ht 5\' 6"  (1.676 m)   Wt 80.3 kg   LMP 11/14/2013   SpO2 94%   BMI 28.57 kg/m  SpO2: SpO2: 94 % O2 Device: O2 Device: Room Air O2 Flow Rate: O2 Flow Rate (L/min): 2 L/min  Intake/output summary:  No intake or output data in the 24 hours ending 10/26/19 1145 LBM: Last BM Date:  10/23/19 Baseline Weight: Weight: 80.3 kg Most recent weight: Weight: 80.3 kg       Palliative Assessment/Data:  Patient Active Problem List   Diagnosis Date Noted  . Generalized pain   . General weakness   . Palliative care by specialist   . Acute respiratory failure ([place]) 10/16/2019  . Diabetes mellitus type 2, uncontrolled, with complications ([place]) 43/32/9518  . Acute respiratory failure with hypoxia ([place]) 10/16/2019  . Pneumonia of right lower lobe due to infectious organism 10/16/2019  . Metastases to the liver ([place]) 10/10/2019  . 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 Pleural effusion on left 10/09/2019  . Type 2 diabetes mellitus with hyperglycemia ([place]) 10/09/2019  . Encounter for central line placement 08/13/2018  . Port-A-Cath in place 08/07/2018  . Family history of lung cancer   . Malignant neoplasm of overlapping sites of right breast in female, estrogen receptor positive ([place]) 07/23/2018  . Achilles tendonitis, bilateral 06/11/2015  . Chest pain with moderate risk for cardiac etiology 10/13/2014  . Hypercholesterolemia 10/13/2014  . Idiopathic scoliosis 08/18/2014  . Hypertension 09/11/2012  . Overweight (BMI 25.0-29.9) 09/11/2012  . Migraine headache 09/11/2012  . Hx of thyroid nodule 09/11/2012  . History of tobacco use 09/11/2012    Palliative Care Assessment & Plan   Patient Profile: 59 year old female with metastatic breast cancer under the care of [name].  She is undergoing chemotherapy and radiation and is status post Pleurx catheter placement with cytology revealing malignant cells.  Currently admitted for discomfort and shortness of breath and pulmonary working to increase drainage from Pleurx catheter.  Plan on discharge she is for follow-up with [name] for further treatment with capecitabine.  Palliative following for pain management.  Recommendations/Plan: -Pain:  Recommend continue fentanyl patch 25 mcg/h.  Discussed plan to use  transition back to oral morphine 5 to 10 mg on an as-needed basis but emphasized need to take medication as soon as she begins to have pain rather than waiting as it does take longer for the onset of relief with oral medication versus IV.  We also discussed need to pretreat prior to activities that are going to cause pain.  Backup plan if this is ineffective will be to either uptitrate patch (likely to 37.5 mcg/h) or trial tramadol. -Constipation: Opioid related.  Continue Miralax.  I gave her a couple black coffee today as she reports this usually helps her with a bowel movement.  I also added on senna at night.  Goals of Care and Additional Recommendations:  Limitations on Scope of Treatment: Full Scope Treatment  Code Status:    Code Status Orders  (From admission, onward)         Start     Ordered   10/16/19 0803  Full code  Continuous        10/16/19 0808        Code Status History    Date Active Date Inactive Code Status Order ID Comments User Context   10/09/2019 2245 10/14/2019 2035 Full Code [id number]  Rise Patience, MD Inpatient   Advance Care Planning Activity       Prognosis:   Unable to determine  Discharge Planning:  Home with [facility] vs SNF for rehab  Care plan was discussed with patient, RN  Thank you for allowing the Palliative Medicine Team to assist in the care of this patient.    Total Time 30 Prolonged Time Billed No   Greater than 50%  of this time was spent counseling and coordinating care related to the above assessment and plan.  Micheline Rough, MD  Please contact Palliative Medicine Team phone at [phone] for questions and concerns.

## 2019-10-26 NOTE — Progress Notes (Signed)
Previous L IJ site assessed per RN's request. Dressing carefully removed and cleaned with CHG swab. Site dry, no open areas noted. Explained protocol to leave dressing for 24 hours post device removal but some patients prefer to keep area covered & gave patient & family option to re-dress or keep it open to air. They chose to leave are open to air. Assigned RN made aware & to check site later on.

## 2019-10-26 NOTE — Progress Notes (Signed)
PROGRESS NOTE    Autumn Cook  QQP:[id number] DOB: 06/02/1960 DOA: 10/16/2019 PCP: Darreld Mclean, MD   Chief Complaint  Patient presents with  . Shortness of Breath  . Chest Pain    Brief Narrative:  Patient is Autumn Cook 59 year old female with history of right breast cancer, hypertension, hyperlipidemia, type 2 diabetes mellitus, asthma who presents to the emergency department with complaints of central sharp chest pain as well as shortness of breath. She was recently admitted here and discharged 2 days prior to presentation when she underwent Pleurx catheter placement by PCCM after she was found to have large left pleural effusion secondary to her breast cancer. Patient did not know how to use the Pleurx catheter at home. On presentation, chest x-ray showed complete white out of the left lung but there was also suspicion for new consolidations on the right side concerning for pneumonia for which she was started on antibiotics.  Assessment & Plan:   Active Problems:   Hypertension   History of tobacco use   Hypercholesterolemia   Malignant neoplasm of overlapping sites of right breast in female, estrogen receptor positive ([place])   Encounter for central line placement   Malignant pleural effusion   Pleural effusion on left   Metastases to the liver [facility])   Acute respiratory failure ([facility])   Diabetes mellitus type 2, uncontrolled, with complications ([facility])   Acute respiratory failure with hypoxia ([facility])   Pneumonia of right lower lobe due to infectious organism   Generalized pain   General weakness   Palliative care by specialist  Acute on chronic respiratory failure with hypoxia: Secondary to left-sided extensivemalignantpleural effusion. She is on 2 L of oxygen at home at baseline. She was recently admitted here and underwent left-sided Pleurx catheter but she did not know how to operate it at home. - Chest tube clamped per PCCM, resume 2x weekly drainage - has outpatient pulm  f/u 7/8 at 0930 with Geraldo Pitter  - they've reviewed drainage with pt - pulm suspects element of trapped lung, won't be candidate for pleurodesis due to this - note it may be beneficial to start therapy for metastatic breast cancer as this may help control accumulation of pleural fluid -> planning to start monday - CXR 6/15 with decrease in volume of L pleural effusion and improved aeration to the LUL and left midlung - CXR 6/17 with pleurx catheter on L and progression of large L effusion and left lower lobe collapse - Oncology following, appreciate recommendations - will look into possible SNF if possible - repeat eval by therapy -> recommending HH PT, wheelchair for longer distances as pt ambulating 16' - Palliative care following for pain control, appreciate assistance, she continues to have issues with pain and nausea - she's gotten frequent IV morphine today.  Trying oral morphine per palliative.  Dizziness: resolved.  Likely related to dilaudid.    Suspected HCAP:CXR onpresentation showed possible consolidations on the right side.She is not febrile or septic. She wasstarted on cefepime by admitting physician.Antibiotics have been stopped as there was very low suspicion for pneumonia. Procalcitonin also was negative.  Chest Pain: occurred 6/17-18 PM, EKG with NSR, resolved and has not recurred, suspected related to her pleural effusion above  Malignantneoplasm of the right breast: [name] is following. Notstarted on chemotherapy yet.  Hypertension: Blood pressure stable at this time.  Continue atenolol.  Continue to monitor and adjust medications as needed.  Diabetes mellitus: A1c of 7.8. Continue sliding scaleinsulin for now.She  is not on any medications at home. Consider Metformin 500 mg twice Doreather Hoxworth day on discharge.  Hypomagnesemia: Replace mag.  Potassium stable.  Goals of care:Palliative care consulted for pain management, goals of care.  Pain control-  appreciate palliative care  Left IJ - d/c'd  ? Contact dermatitis: hydrocortisone ordered, not sure if this is related to pleurx dressing, it's only along bottom of dressing.  Continue to monitor.  DVT prophylaxis: (lovenox Code Status: full  Family Communication: daughter at bedside Disposition:   Status is: Inpatient  Remains inpatient appropriate because:Inpatient level of care appropriate due to severity of illness   Dispo: The patient is from: Home              Anticipated d/c is to: Home              Anticipated d/c date is: 2 days              Patient currently is not medically stable to d/c.   Consultants:   PCCM  Pulm  Oncology  Procedures:  none  Antimicrobials: Anti-infectives (From admission, onward)   Start     Dose/Rate Route Frequency Ordered Stop   10/16/19 1400  ceFEPIme (MAXIPIME) 2 g in sodium chloride 0.9 % 100 mL IVPB  Status:  Discontinued        2 g 200 mL/hr over 30 Minutes Intravenous Every 8 hours 10/16/19 0817 10/17/19 1426   10/16/19 0530  vancomycin (VANCOCIN) IVPB 1000 mg/200 mL premix        1,000 mg 200 mL/hr over 60 Minutes Intravenous  Once 10/16/19 0520 10/16/19 0741   10/16/19 0530  ceFEPIme (MAXIPIME) 2 g in sodium chloride 0.9 % 100 mL IVPB        2 g 200 mL/hr over 30 Minutes Intravenous  Once 10/16/19 0520 10/16/19 0741      Subjective: Uncomfortable throughout day today C/o nausea and pain with movement   Objective: Vitals:   10/25/19 2005 10/26/19 0418 10/26/19 0958 10/26/19 1624  BP: 130/85 139/75 (!) 148/85 137/90  Pulse: 86 92 90 90  Resp: 17 16  (!) 22  Temp: 98.3 F (36.8 C) 98.2 F (36.8 C)  98.6 F (37 C)  TempSrc: Oral Oral    SpO2: 99% 94%  98%  Weight:      Height:        Intake/Output Summary (Last 24 hours) at 10/26/2019 1918 Last data filed at 10/26/2019 1800 Gross per 24 hour  Intake 360 ml  Output --  Net 360 ml   Filed Weights   10/16/19 0431  Weight: 80.3 kg     Examination:  General: No acute distress. Cardiovascular: Heart sounds show Deaun Rocha regular rate, and rhythm. Lungs: Clear to auscultation bilaterally Abdomen: Soft, nontender, nondistended  Neurological: Alert and oriented 3. Moves all extremities 4. Cranial nerves II through XII grossly intact. Skin: linear rash along bottom of pleurx dressing Extremities: No clubbing or cyanosis. No edema.     Data Reviewed: I have personally reviewed following labs and imaging studies  CBC: Recent Labs  Lab 10/22/19 0342 10/23/19 0509 10/24/19 0524 10/25/19 0606 10/26/19 0800  WBC 7.4 7.4 10.5 8.4 9.5  NEUTROABS 5.6 5.9 8.8* 6.5 7.8*  HGB 8.9* 8.8* 9.0* 8.3* 8.7*  HCT 28.1* 26.6* 28.0* 25.6* 26.7*  MCV 91.2 88.7 89.5 88.6 90.2  PLT 390 368 381 402* 433*    Basic Metabolic Panel: Recent Labs  Lab 10/22/19 0342 10/23/19 0509 10/24/19 0524 10/25/19  5366 10/25/19 0616 10/26/19 0542  NA 141 139 139  --  137 137  K 4.3 4.1 4.6  --  4.4 4.2  CL 106 106 105  --  101 104  CO2 25 24 23   --  25 25  GLUCOSE 100* 158* 140*  --  119* 151*  BUN 31* 27* 20  --  20 18  CREATININE 0.98 0.79 0.73  --  0.72 0.70  CALCIUM 9.0 8.9 9.1  --  8.9 9.1  MG 1.9 1.6* 1.9 1.7  --  1.6*  PHOS  --  2.8 3.6  --   --  3.7    GFR: Estimated Creatinine Clearance: 82.9 mL/min (by C-G formula based on SCr of 0.7 mg/dL).  Liver Function Tests: Recent Labs  Lab 10/20/19 0405 10/21/19 0504 10/23/19 0509 10/24/19 0524 10/26/19 0542  AST 41 45* 67* 53* 50*  ALT 23 23 26 27 28   ALKPHOS 58 58 66 74 75  BILITOT 0.5 0.5 0.6 0.6 0.3  PROT 6.1* 6.0* 5.7* 6.0* 6.1*  ALBUMIN 2.6* 2.6* 2.4* 2.5* 2.4*    CBG: Recent Labs  Lab 10/25/19 2357 10/26/19 0415 10/26/19 0816 10/26/19 1240 10/26/19 1621  GLUCAP 116* 156* 110* 133* 141*     Recent Results (from the past 240 hour(s))  Blood culture (routine x 2)     Status: None   Collection Time: 10/16/19  9:42 PM   Specimen: BLOOD  Result Value Ref  Range Status   Specimen Description   Final    BLOOD LEFT HAND Performed at [facility], [place] [address] [place], [place] [address]    Special Requests   Final    BOTTLES DRAWN AEROBIC ONLY Blood Culture adequate volume Performed at [facility] [address] [place], [place] [address]    Culture   Final    NO GROWTH 5 DAYS Performed at [facility], [place] [address], [place], [place] [address]    Report Status 10/22/2019 FINAL  Final         Radiology Studies: No results found.      Scheduled Meds: . acetaminophen  500 mg Oral TID WC  . acetaminophen  500 mg Oral Once  . atenolol  100 mg Oral Daily  . pulmozyme (DORNASE) for intrapleural administration  5 mg Intrapleural Once  . enoxaparin (LOVENOX) injection  40 mg Subcutaneous Q24H  . feeding supplement (ENSURE ENLIVE)  237 mL Oral TID BM  . fentaNYL  1 patch Transdermal Q72H  . hydrocortisone cream   Topical BID  . insulin aspart  0-15 Units Subcutaneous Q4H  . mouth rinse  15 mL Mouth Rinse BID  . naproxen  250 mg Oral TID WC  . polyethylene glycol  17 g Oral Daily  . senna  2 tablet Oral QHS  . sucralfate  1 g Oral TID WC & HS  . venlafaxine XR  37.5 mg Oral 1 day or 1 dose   Continuous Infusions:   LOS: 10 days    Time spent: over 30 min    Fayrene Helper, MD [facility]   To contact the attending provider between 7A-7P or the covering provider during after hours 7P-7A, please log into the web site [email or web address] and access using universal [place] password for that web site. If you do not have the password, please call the hospital operator.  10/26/2019, 7:18 PM

## 2019-10-26 NOTE — Progress Notes (Signed)
Autumn Cook   DOB:[birth year]   LE#:[id number]   BSW#:[id number]  Subjective:  [name] appears much less anxious this afternoon.  She continues to have significant pain and significant problems with pain medication.  Specifically the Dilaudid made her feel very dizzy.  She has been able to get up and walk around the room some which is favorable.  She is lying on her left side which helps her breathing.  She has not had a bowel movement in 3 days.  Daughter Autumn Cook is in room.  Objective: African American woman examined in bed Vitals:   10/26/19 0418 10/26/19 0958  BP: 139/75 (!) 148/85  Pulse: 92 90  Resp: 16   Temp: 98.2 F (36.8 C)   SpO2: 94%     Body mass index is 28.57 kg/m. No intake or output data in the 24 hours ending 10/26/19 1350     CBG (last 3)  Recent Labs    10/26/19 0415 10/26/19 0816 10/26/19 1240  GLUCAP 156* 110* 133*     Labs:  Lab Results  Component Value Date   WBC 9.5 10/26/2019   HGB 8.7 (L) 10/26/2019   HCT 26.7 (L) 10/26/2019   MCV 90.2 10/26/2019   PLT 433 (H) 10/26/2019   NEUTROABS 7.8 (H) 10/26/2019    _0 @  Urine Studies No results for input(s): UHGB, CRYS in the last 72 hours.  Invalid input(s): UACOL, UAPR, USPG, UPH, UTP, UGL, UKET, UBIL, UNIT, UROB, ULEU, UEPI, UWBC, URBC, UBAC, CAST, [place], [place]  Basic Metabolic Panel: Recent Labs  Lab 10/22/19 0342 10/22/19 0342 10/23/19 0509 10/23/19 0509 10/24/19 0524 10/24/19 0524 10/25/19 0606 10/25/19 0616 10/26/19 0542  NA 141  --  139  --  139  --   --  137 137  K 4.3   < > 4.1   < > 4.6   < >  --  4.4 4.2  CL 106  --  106  --  105  --   --  101 104  CO2 25  --  24  --  23  --   --  25 25  GLUCOSE 100*  --  158*  --  140*  --   --  119* 151*  BUN 31*  --  27*  --  20  --   --  20 18  CREATININE 0.98  --  0.79  --  0.73  --   --  0.72 0.70  CALCIUM 9.0  --  8.9  --  9.1  --   --  8.9 9.1  MG 1.9  --  1.6*  --  1.9  --  1.7  --  1.6*  PHOS  --   --  2.8  --  3.6  --    --   --  3.7   < > = values in this interval not displayed.   GFR Estimated Creatinine Clearance: 82.9 mL/min (by C-G formula based on SCr of 0.7 mg/dL). Liver Function Tests: Recent Labs  Lab 10/20/19 0405 10/21/19 0504 10/23/19 0509 10/24/19 0524 10/26/19 0542  AST 41 45* 67* 53* 50*  ALT _1 ALKPHOS 58 58 66 74 75  BILITOT 0.5 0.5 0.6 0.6 0.3  PROT 6.1* 6.0* 5.7* 6.0* 6.1*  ALBUMIN 2.6* 2.6* 2.4* 2.5* 2.4*   No results for input(s): LIPASE, AMYLASE in the last 168 hours. No results for input(s): AMMONIA in the last 168 hours. Coagulation profile No results for input(s): INR,  PROTIME in the last 168 hours.  CBC: Recent Labs  Lab 10/22/19 0342 10/23/19 0509 10/24/19 0524 10/25/19 0606 10/26/19 0800  WBC 7.4 7.4 10.5 8.4 9.5  NEUTROABS 5.6 5.9 8.8* 6.5 7.8*  HGB 8.9* 8.8* 9.0* 8.3* 8.7*  HCT 28.1* 26.6* 28.0* 25.6* 26.7*  MCV 91.2 88.7 89.5 88.6 90.2  PLT 390 368 381 402* 433*   Cardiac Enzymes: No results for input(s): CKTOTAL, CKMB, CKMBINDEX, TROPONINI in the last 168 hours. BNP: Invalid input(s): POCBNP CBG: Recent Labs  Lab 10/25/19 2004 10/25/19 2357 10/26/19 0415 10/26/19 0816 10/26/19 1240  GLUCAP 134* 116* 156* 110* 133*   D-Dimer No results for input(s): DDIMER in the last 72 hours. Hgb A1c No results for input(s): HGBA1C in the last 72 hours. Lipid Profile No results for input(s): CHOL, HDL, LDLCALC, TRIG, CHOLHDL, LDLDIRECT in the last 72 hours. Thyroid function studies No results for input(s): TSH, T4TOTAL, T3FREE, THYROIDAB in the last 72 hours.  Invalid input(s): FREET3 Anemia work up No results for input(s): VITAMINB12, FOLATE, FERRITIN, TIBC, IRON, RETICCTPCT in the last 72 hours. Microbiology Recent Results (from the past 240 hour(s))  Blood culture (routine x 2)     Status: None   Collection Time: 10/16/19  9:42 PM   Specimen: BLOOD  Result Value Ref Range Status   Specimen Description   Final    BLOOD LEFT  HAND Performed at [facility] [address] [place], [place] [address]    Special Requests   Final    BOTTLES DRAWN AEROBIC ONLY Blood Culture adequate volume Performed at [facility] [address] [place], [place] [address]    Culture   Final    NO GROWTH 5 DAYS Performed at [facility], [place] [address] [place], [place] [address]    Report Status 10/22/2019 FINAL  Final      Studies:  No results found.  Assessment: 59 y.o. [place], [place] woman status post right breast biopsy x2 on 07/10/2018 for multicentric invasive ductal carcinoma, clinically T1c N1, stage IIB, grade 3, functionally triple negative, with an MIB-1 of 70% (a) right axillary lymph node biopsied at the same time was positive  (1) neoadjuvant chemotherapy consisting of doxorubicin and cyclophosphamide in dose dense fashion x4 started 08/07/2018, completed 09/16/2018, followed by weekly Abraxane and carboplatin x12 starting 08/07/2018, stopped after 2 cycles (last dose on 10/15/2018) due to peripheral neuropathy   (2) Right breast lumpectomy on 12/27/2018 shows a ypT1b ypN0 residual invasive ductal carcinoma, margins negative. (a) 5 sentinel nodes were negative.  (b) Estrogen receptor 75% positive, weak, Progesterone receptor negative.  (3) adjuvant radiation  Radiation Treatment Dates: 02/18/2019 through 04/02/2019 Site Technique Total Dose (Gy) Dose per Fx (Gy) Completed Fx Beam Energies  Breast: Breast_Rt 3D 50.4/50.4 1.8 28/28 6X, 10X  Breast: Breast_Rt_axilla 3D 50.4/50.4 1.8 28/28 6X, 10X  Breast: Breast_Rt_Bst 3D 20/20 2 10/10 6X, 10X    (4) genetics testing 08/06/2018 through the Breast + GYN Cancers Panel offered by [facility] found no deleterious mutationsin ATM, BARD1, BRCA1, BRCA2, BRIP1, CDH1, CHEK2, DICER1, EPCAM, MLH1, MSH2, MSH6, NBN, NF1, PALB2, PMS2, PTEN, RAD50, RAD51C, RAD51D, SMARCA4, STK11, TP53.   (5)  anastrozole started 05/14/2018 (a) bone density 09/22/2019 shows a T score of -0.8 (normal).  METASTATIC DISEASE: June 2021 (6)CT renalstonestudy 09/30/2019 to evaluate left flank pain shows left pleural effusion and possible liver lesions (a)cytology from the left pleural effusion 10/03/2019 confirms metastatic disease (b)liver MRI 10/04/2019 shows multiple liver  lesions (c) prognostic panel requested 10/10/2019 on cytology from 10/03/2019 flui  (7) left effusion: left pleurx placement 10/13/2019  (a) loculated, not a candidate for pleurodesis per pulmonary, would need decortication  (8) pain: currently on baseline fentanyl 25 mcg/h patch  (a) breakthrough opiates and naproxen/tylenol  (b) bowel prophylaxis--MiraLAX Colace, sorbitol as needed   Plan:  Emaley will benefit from discharge to a skilled nursing facility where she will have 24-hour support until she and her family are able to take care of her pain, breathing difficulties, bowel movements, and other issues.  Otherwise she will simply be back in the emergency room within 48 hours of discharge.  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  She is agreeable.  We are going to start with 37.5 mg daily and after a week or so if she tolerates it well we will try to increase the dose to 75 mg.  I am writing for sorbitol today.  Hopefully she will be able to have a bowel movement with that.  Otherwise we are continuing MiraLAX and Colace daily  We can start her chemotherapy at any point now.  These are oral medications which she can bring from home.  I would prefer that we do it as outpatient but if it does not look likely for her to leave the hospital for another week or so we can start within the next 48 hours  We will continue to follow with you.  Chauncey Cruel, MD 10/26/2019  1:50 PM Medical Oncology and Hematology [facility] [address] [place], [place] [address] Tel. [phone]    Fax. [phone]

## 2019-10-27 ENCOUNTER — Ambulatory Visit: Payer: 59 | Admitting: Physical Therapy

## 2019-10-27 LAB — CBC WITH DIFFERENTIAL/PLATELET
Abs Immature Granulocytes: 0.12 10*3/uL — ABNORMAL HIGH (ref 0.00–0.07)
Basophils Absolute: 0 10*3/uL (ref 0.0–0.1)
Basophils Relative: 0 %
Eosinophils Absolute: 0 10*3/uL (ref 0.0–0.5)
Eosinophils Relative: 0 %
HCT: 25 % — ABNORMAL LOW (ref 36.0–46.0)
Hemoglobin: 8.2 g/dL — ABNORMAL LOW (ref 12.0–15.0)
Immature Granulocytes: 1 %
Lymphocytes Relative: 7 %
Lymphs Abs: 0.7 10*3/uL (ref 0.7–4.0)
MCH: 29 pg (ref 26.0–34.0)
MCHC: 32.8 g/dL (ref 30.0–36.0)
MCV: 88.3 fL (ref 80.0–100.0)
Monocytes Absolute: 0.6 10*3/uL (ref 0.1–1.0)
Monocytes Relative: 6 %
Neutro Abs: 8.1 10*3/uL — ABNORMAL HIGH (ref 1.7–7.7)
Neutrophils Relative %: 86 %
Platelets: 449 10*3/uL — ABNORMAL HIGH (ref 150–400)
RBC: 2.83 MIL/uL — ABNORMAL LOW (ref 3.87–5.11)
RDW: 13.9 % (ref 11.5–15.5)
WBC: 9.5 10*3/uL (ref 4.0–10.5)
nRBC: 0 % (ref 0.0–0.2)

## 2019-10-27 LAB — GLUCOSE, CAPILLARY
Glucose-Capillary: 116 mg/dL — ABNORMAL HIGH (ref 70–99)
Glucose-Capillary: 122 mg/dL — ABNORMAL HIGH (ref 70–99)
Glucose-Capillary: 124 mg/dL — ABNORMAL HIGH (ref 70–99)
Glucose-Capillary: 131 mg/dL — ABNORMAL HIGH (ref 70–99)
Glucose-Capillary: 147 mg/dL — ABNORMAL HIGH (ref 70–99)
Glucose-Capillary: 161 mg/dL — ABNORMAL HIGH (ref 70–99)

## 2019-10-27 LAB — COMPREHENSIVE METABOLIC PANEL
ALT: 25 U/L (ref 0–44)
AST: 43 U/L — ABNORMAL HIGH (ref 15–41)
Albumin: 2.5 g/dL — ABNORMAL LOW (ref 3.5–5.0)
Alkaline Phosphatase: 78 U/L (ref 38–126)
Anion gap: 14 (ref 5–15)
BUN: 20 mg/dL (ref 6–20)
CO2: 22 mmol/L (ref 22–32)
Calcium: 9.3 mg/dL (ref 8.9–10.3)
Chloride: 100 mmol/L (ref 98–111)
Creatinine, Ser: 0.69 mg/dL (ref 0.44–1.00)
GFR calc Af Amer: >60 mL/min (ref >60)
GFR calc non Af Amer: >60 mL/min (ref >60)
Glucose, Bld: 154 mg/dL — ABNORMAL HIGH (ref 70–99)
Potassium: 4.3 mmol/L (ref 3.5–5.1)
Sodium: 136 mmol/L (ref 135–145)
Total Bilirubin: 0.3 mg/dL (ref 0.3–1.2)
Total Protein: 6.3 g/dL — ABNORMAL LOW (ref 6.5–8.1)

## 2019-10-27 LAB — MAGNESIUM: Magnesium: 1.7 mg/dL (ref 1.7–2.4)

## 2019-10-27 LAB — PHOSPHORUS: Phosphorus: 4.8 mg/dL — ABNORMAL HIGH (ref 2.5–4.6)

## 2019-10-27 MED ORDER — PROMETHAZINE HCL 25 MG RE SUPP: 12.5000 mg | Freq: Four times a day (QID) | RECTAL | Status: DC | PRN

## 2019-10-27 MED ORDER — PROMETHAZINE HCL 25 MG PO TABS
12.5000 mg | ORAL_TABLET | Freq: Four times a day (QID) | ORAL | Status: DC
  Administered 2019-10-27: 12.5 mg via ORAL
  Filled 2019-10-27: qty 1

## 2019-10-27 MED ORDER — PROMETHAZINE HCL 25 MG PO TABS
12.5000 mg | ORAL_TABLET | Freq: Four times a day (QID) | ORAL | Status: DC
  Administered 2019-10-27 – 2019-11-13 (×61): 12.5 mg via ORAL
  Filled 2019-10-27 (×67): qty 1

## 2019-10-27 MED ORDER — PROMETHAZINE HCL 25 MG PO TABS: 25.0000 mg | ORAL_TABLET | Freq: Four times a day (QID) | ORAL | Status: DC

## 2019-10-27 MED ORDER — FENTANYL 12 MCG/HR TD PT72
1.0000 | MEDICATED_PATCH | TRANSDERMAL | Status: DC
  Administered 2019-10-27 – 2019-11-11 (×6): 1 via TRANSDERMAL
  Filled 2019-10-27 (×6): qty 1

## 2019-10-27 MED ORDER — PROMETHAZINE HCL 25 MG/ML IJ SOLN: 6.2500 mg | Freq: Four times a day (QID) | INTRAMUSCULAR | Status: DC | PRN

## 2019-10-27 MED ORDER — CAPECITABINE 500 MG PO TABS: 1500.0000 mg | ORAL_TABLET | Freq: Two times a day (BID) | ORAL | Status: DC

## 2019-10-27 MED ORDER — PROMETHAZINE HCL 25 MG PO TABS: 12.5000 mg | ORAL_TABLET | Freq: Four times a day (QID) | ORAL | Status: DC | PRN

## 2019-10-27 MED ORDER — POLYETHYLENE GLYCOL 3350 17 G PO PACK
17.0000 g | PACK | Freq: Two times a day (BID) | ORAL | Status: DC
  Administered 2019-10-28 – 2019-11-07 (×11): 17 g via ORAL
  Filled 2019-10-27 (×21): qty 1

## 2019-10-27 MED ORDER — TRAMADOL HCL 50 MG PO TABS
50.0000 mg | ORAL_TABLET | Freq: Four times a day (QID) | ORAL | Status: DC | PRN
  Administered 2019-10-27 – 2019-11-03 (×14): 50 mg via ORAL
  Filled 2019-10-27 (×21): qty 1

## 2019-10-27 MED ORDER — SENNA 8.6 MG PO TABS
2.0000 | ORAL_TABLET | Freq: Two times a day (BID) | ORAL | Status: DC
  Administered 2019-10-28 – 2019-11-13 (×21): 17.2 mg via ORAL
  Filled 2019-10-27 (×32): qty 2

## 2019-10-27 NOTE — NC FL2 (Signed)
[place] MEDICAID FL2 LEVEL OF CARE SCREENING TOOL     IDENTIFICATION  Patient Name: Autumn Cook Birthdate: 08/18/1960 Sex: female Admission Date (Current Location): 10/16/2019  [facility] and [place] Number:  Herbalist and Address:  [facility],  [place], [place]      Provider Number: [id number]  Attending Physician Name and Address:  Elodia Florence., *  Relative Name and Phone Number:  Latash Nouri [phone]    Current Level of Care: Hospital Recommended Level of Care: [place] Prior Approval Number:    Date Approved/Denied:   PASRR Number: [id number]  Discharge Plan: SNF    Current Diagnoses: Patient Active Problem List   Diagnosis Date Noted  . Generalized pain   . General weakness   . Palliative care by specialist   . Acute respiratory failure ([place]) 10/16/2019  . Diabetes mellitus type 2, uncontrolled, with complications ([place]) 02/58/5277  . Acute respiratory failure with hypoxia ([place]) 10/16/2019  . Pneumonia of right lower lobe due to infectious organism 10/16/2019  . Metastases to the liver ([place]) 10/10/2019  . 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 Pleural effusion on left 10/09/2019  . Type 2 diabetes mellitus with hyperglycemia ([place]) 10/09/2019  . Encounter for central line placement 08/13/2018  . Port-A-Cath in place 08/07/2018  . Family history of lung cancer   . Malignant neoplasm of overlapping sites of right breast in female, estrogen receptor positive ([place]) 07/23/2018  . Achilles tendonitis, bilateral 06/11/2015  . Chest pain with moderate risk for cardiac etiology 10/13/2014  . Hypercholesterolemia 10/13/2014  . Idiopathic scoliosis 08/18/2014  . Hypertension 09/11/2012  . Overweight (BMI 25.0-29.9) 09/11/2012  . Migraine headache 09/11/2012  . Hx of thyroid nodule 09/11/2012  . History of tobacco use 09/11/2012    Orientation RESPIRATION BLADDER Height & Weight      Self, Time, Situation, Place  Normal Continent Weight: 80.3 kg Height:  5\' 6"  (167.6 cm)  BEHAVIORAL SYMPTOMS/MOOD NEUROLOGICAL BOWEL NUTRITION STATUS      Continent Diet (Regular)  AMBULATORY STATUS COMMUNICATION OF NEEDS Skin   Limited Assist Verbally Normal                       Personal Care Assistance Level of Assistance  Bathing, Dressing, Feeding Bathing Assistance: Limited assistance Feeding assistance: Limited assistance Dressing Assistance: Limited assistance     Functional Limitations Info  Sight, Hearing, Speech Sight Info: Adequate Hearing Info: Adequate Speech Info: Adequate    SPECIAL CARE FACTORS FREQUENCY  PT (By licensed PT), OT (By licensed OT) (Pleurx drain-drain PRN,currently 2x/week)     PT Frequency: 5x week OT Frequency: 5x week            Contractures Contractures Info: Not present    Additional Factors Info  Code Status, Allergies Code Status Info: Full code Allergies Info: Penicillins, Metformin And Related, Adhesive Tape, Asa Aspirin, Mango Flavor, Olive Tree           Current Medications (10/27/2019):  This is the current hospital active medication list Current Facility-Administered Medications  Medication Dose Route Frequency Provider Last Rate Last Admin  . acetaminophen (TYLENOL) tablet 500 mg  500 mg Oral TID WC Magrinat, Virgie Dad, MD   500 mg at 10/27/19 0805  . atenolol (TENORMIN) tablet 100 mg  100 mg Oral Daily Shelly Coss, MD   100 mg at 10/27/19 1033  . capecitabine (XELODA) tablet 1,500 mg  1,500  mg Oral BID PC Elodia Florence., MD      . dornase alpha (PULMOZYME) 5 mg in sterile water (preservative free) 30 mL  5 mg Intrapleural Once Ollis, Brandi L, NP      . enoxaparin (LOVENOX) injection 40 mg  40 mg Subcutaneous Q24H Shelly Coss, MD   40 mg at 10/27/19 1034  . feeding supplement (ENSURE ENLIVE) (ENSURE ENLIVE) liquid 237 mL  237 mL Oral TID BM Matcha, Anupama, MD   237 mL at 10/25/19 2018  . fentaNYL  (DURAGESIC) 25 MCG/HR 1 patch  1 patch Transdermal Q72H Georgette Shell, MD   1 patch at 10/24/19 1201  . hydrocortisone cream 1 %   Topical BID Elodia Florence., MD   Given at 10/27/19 1034  . insulin aspart (novoLOG) injection 0-15 Units  0-15 Units Subcutaneous Q4H Allie Bossier, MD   2 Units at 10/27/19 0805  . ipratropium-albuterol (DUONEB) 0.5-2.5 (3) MG/3ML nebulizer solution 3 mL  3 mL Nebulization Q4H PRN Elodia Florence., MD      . MEDLINE mouth rinse  15 mL Mouth Rinse BID Matcha, Anupama, MD   15 mL at 10/27/19 1035  . metoprolol tartrate (LOPRESSOR) injection 5 mg  5 mg Intravenous Q8H PRN Allie Bossier, MD      . morphine 10 MG/5ML solution 5-10 mg  5-10 mg Oral Q3H PRN Micheline Rough, MD   10 mg at 10/27/19 0743  . morphine 2 MG/ML injection 2 mg  2 mg Intravenous Q3H PRN Micheline Rough, MD   2 mg at 10/27/19 1043  . polyethylene glycol (MIRALAX / GLYCOLAX) packet 17 g  17 g Oral BID Elodia Florence., MD      . prochlorperazine (COMPAZINE) tablet 5 mg  5 mg Oral Q8H PRN Magrinat, Virgie Dad, MD   5 mg at 10/27/19 1040  . promethazine (PHENERGAN) tablet 12.5 mg  12.5 mg Oral Q6H Elodia Florence., MD      . [facility]) tablet 17.2 mg  2 tablet Oral QHS Micheline Rough, MD   17.2 mg at 10/26/19 2109  . sorbitol 70 % solution 30 mL  30 mL Oral Daily PRN Magrinat, Virgie Dad, MD      . sucralfate (CARAFATE) 1 GM/10ML suspension 1 g  1 g Oral TID WC & HS Magrinat, Virgie Dad, MD   1 g at 10/27/19 0805  . venlafaxine XR (EFFEXOR-XR) 24 hr capsule 37.5 mg  37.5 mg Oral 1 day or 1 dose Magrinat, Virgie Dad, MD   37.5 mg at 10/26/19 1610     Discharge Medications: Please see discharge summary for a list of discharge medications.  Relevant Imaging Results:  Relevant Lab Results:   Additional Information SS#[id number] [place]  Bemnet Trovato, Juliann Pulse, RN

## 2019-10-27 NOTE — Progress Notes (Signed)
PT Cancellation Note  Patient Details Name: ARIYONNA TWICHELL MRN: [id number] DOB: 1960-11-17   Cancelled Treatment:    Reason Eval/Treat Not Completed: Fatigue/lethargy limiting ability to participate Pt with nausea and pain and declines to mobilize at this time.  Daughter present and reports pain meds made pt dizzy with mobility last PT attempt however pt was able to mobilize around room and into hallway after effects wore off.  Pt would like to PT to continue checking on her and encouraged her pt mobilize with family during "good times" for her pain/nausea.   Elinda Bunten,KATHrine E 10/27/2019, 1:56 PM Arlyce Dice, DPT Acute Rehabilitation Services Pager: [phone] Office: [phone]

## 2019-10-27 NOTE — TOC Progression Note (Signed)
Transition of Care [facility]) - Progression Note    Patient Details  Name: Autumn Cook MRN: [id number] Date of Birth: [birth year]  Transition of [facility]) CM/SW Contact  Burnell Hurta, Juliann Pulse, RN Phone Number: 10/27/2019, 12:06 PM  Clinical Narrative:  Per MD recc for SNF/patient  agree to fax out to SNF-await bed offers,then will start auth.     Expected Discharge Plan: [place] Barriers to Discharge: No [facility] will accept this patient  Expected Discharge Plan and Services Expected Discharge Plan: [place]   Discharge Planning Services: CM Consult   Living arrangements for the past 2 months: Single Family Home Expected Discharge Date:  (unknown)                                     Social Determinants of Health (SDOH) Interventions    Readmission Risk Interventions No flowsheet data found.

## 2019-10-27 NOTE — Progress Notes (Signed)
PROGRESS NOTE    Autumn Cook  YBO:[id number] DOB: 04/20/1961 DOA: 10/16/2019 PCP: Darreld Mclean, MD   Chief Complaint  Patient presents with  . Shortness of Breath  . Chest Pain    Brief Narrative:  Patient is Autumn Cook 59 year old female with history of right breast cancer, hypertension, hyperlipidemia, type 2 diabetes mellitus, asthma who presents to the emergency department with complaints of central sharp chest pain as well as shortness of breath. She was recently admitted here and discharged 2 days prior to presentation when she underwent Pleurx catheter placement by PCCM after she was found to have large left pleural effusion secondary to her breast cancer. Patient did not know how to use the Pleurx catheter at home. On presentation, chest x-ray showed complete white out of the left lung but there was also suspicion for new consolidations on the right side concerning for pneumonia for which she was started on antibiotics.  Assessment & Plan:   Active Problems:   Hypertension   History of tobacco use   Hypercholesterolemia   Malignant neoplasm of overlapping sites of right breast in female, estrogen receptor positive ([place])   Encounter for central line placement   Malignant pleural effusion   Pleural effusion on left   Metastases to the liver [facility])   Acute respiratory failure (HCC)   Diabetes mellitus type 2, uncontrolled, with complications (HCC)   Acute respiratory failure with hypoxia (HCC)   Pneumonia of right lower lobe due to infectious organism   Generalized pain   General weakness   Palliative care by specialist  Acute on chronic respiratory failure with hypoxia: Secondary to left-sided extensivemalignantpleural effusion. She is on 2 L of oxygen at home at baseline. She was recently admitted here and underwent left-sided Pleurx catheter but she did not know how to operate it at home. - Chest tube clamped per PCCM, resume 2x weekly drainage - has outpatient pulm  f/u 7/8 at 0930 with Geraldo Pitter  - they've reviewed drainage with pt - pulm suspects element of trapped lung, won't be candidate for pleurodesis due to this - note it may be beneficial to start therapy for metastatic breast cancer as this may help control accumulation of pleural fluid -> planning to start monday - CXR 6/15 with decrease in volume of L pleural effusion and improved aeration to the LUL and left midlung - CXR 6/17 with pleurx catheter on L and progression of large L effusion and left lower lobe collapse - Oncology following, appreciate recommendations - will look into possible SNF if possible - repeat eval by therapy -> recommending HH PT, wheelchair for longer distances as pt ambulating 16' - Palliative care following for pain control, appreciate assistance, she continues to have issues with pain and nausea - continues to require freq IV morphine.  Fentanyl patch increased to 37.5.  Will schedule phenergan for nausea.  Tramadol prn.  Appreciate palliative care and oncology assistance.  Dizziness: resolved.  Likely related to opiates.    Suspected HCAP:CXR onpresentation showed possible consolidations on the right side.She is not febrile or septic. She wasstarted on cefepime by admitting physician.Antibiotics have been stopped as there was very low suspicion for pneumonia. Procalcitonin also was negative.  Chest Pain: occurred 6/17-18 PM, EKG with NSR, resolved and has not recurred, suspected related to her pleural effusion above  Malignantneoplasm of the right breast: [name] is following. Notstarted on chemotherapy yet.  Hypertension: Blood pressure stable at this time.  Continue atenolol.  Continue to monitor  and adjust medications as needed.  Diabetes mellitus: A1c of 7.8. Continue sliding scaleinsulin for now.She is not on any medications at home. Consider Metformin 500 mg twice Decorey Wahlert day on discharge.  Hypomagnesemia: Replace mag.  Potassium  stable.  Goals of care:Palliative care consulted for pain management, goals of care.  Pain control- appreciate palliative care  Left IJ - d/c'd  ? Contact dermatitis: hydrocortisone ordered, not sure if this is related to pleurx dressing, it's only along bottom of dressing.  Continue to monitor.  DVT prophylaxis: (lovenox Code Status: full  Family Communication: daughter at bedside Disposition:   Status is: Inpatient  Remains inpatient appropriate because:Inpatient level of care appropriate due to severity of illness   Dispo: The patient is from: Home              Anticipated d/c is to: Home              Anticipated d/c date is: 2 days              Patient currently is not medically stable to d/c.   Consultants:   PCCM  Pulm  Oncology  Procedures:  none  Antimicrobials: Anti-infectives (From admission, onward)   Start     Dose/Rate Route Frequency Ordered Stop   10/16/19 1400  ceFEPIme (MAXIPIME) 2 g in sodium chloride 0.9 % 100 mL IVPB  Status:  Discontinued        2 g 200 mL/hr over 30 Minutes Intravenous Every 8 hours 10/16/19 0817 10/17/19 1426   10/16/19 0530  vancomycin (VANCOCIN) IVPB 1000 mg/200 mL premix        1,000 mg 200 mL/hr over 60 Minutes Intravenous  Once 10/16/19 0520 10/16/19 0741   10/16/19 0530  ceFEPIme (MAXIPIME) 2 g in sodium chloride 0.9 % 100 mL IVPB        2 g 200 mL/hr over 30 Minutes Intravenous  Once 10/16/19 0520 10/16/19 0741      Subjective: Continues to be uncomfortable Nauseous primary symptoms  Objective: Vitals:   10/26/19 0958 10/26/19 1624 10/26/19 2107 10/27/19 0453  BP: (!) 148/85 137/90 (!) 153/90 (!) 170/101  Pulse: 90 90 93 91  Resp:  (!) 22 20 17   Temp:  98.6 F (37 C) 98 F (36.7 C) 97.9 F (36.6 C)  TempSrc:   Oral Oral  SpO2:  98% 99% 94%  Weight:      Height:        Intake/Output Summary (Last 24 hours) at 10/27/2019 1719 Last data filed at 10/26/2019 1800 Gross per 24 hour  Intake 120 ml   Output --  Net 120 ml   Filed Weights   10/16/19 0431  Weight: 80.3 kg    Examination:  General: No acute distress.  Appears uncomfortable Cardiovascular: Heart sounds show Autumn Cook regular rate, and rhythm. Lungs: unlabored Abdomen: Soft, nontender, nondistended Neurological: Alert and oriented 3. Moves all extremities 4. Cranial nerves II through XII grossly intact. Skin: Warm and dry. No rashes or lesions. Extremities: No clubbing or cyanosis. No edema.  Data Reviewed: I have personally reviewed following labs and imaging studies  CBC: Recent Labs  Lab 10/23/19 0509 10/24/19 0524 10/25/19 0606 10/26/19 0800 10/27/19 0500  WBC 7.4 10.5 8.4 9.5 9.5  NEUTROABS 5.9 8.8* 6.5 7.8* 8.1*  HGB 8.8* 9.0* 8.3* 8.7* 8.2*  HCT 26.6* 28.0* 25.6* 26.7* 25.0*  MCV 88.7 89.5 88.6 90.2 88.3  PLT 368 381 402* 433* 449*    Basic Metabolic Panel: Recent  Labs  Lab 10/23/19 0509 10/24/19 0524 10/25/19 0606 10/25/19 0616 10/26/19 0542 10/27/19 0500  NA 139 139  --  137 137 136  K 4.1 4.6  --  4.4 4.2 4.3  CL 106 105  --  101 104 100  CO2 24 23  --  25 25 22   GLUCOSE 158* 140*  --  119* 151* 154*  BUN 27* 20  --  20 18 20   CREATININE 0.79 0.73  --  0.72 0.70 0.69  CALCIUM 8.9 9.1  --  8.9 9.1 9.3  MG 1.6* 1.9 1.7  --  1.6* 1.7  PHOS 2.8 3.6  --   --  3.7 4.8*    GFR: Estimated Creatinine Clearance: 82.9 mL/min (by C-G formula based on SCr of 0.69 mg/dL).  Liver Function Tests: Recent Labs  Lab 10/21/19 0504 10/23/19 0509 10/24/19 0524 10/26/19 0542 10/27/19 0500  AST 45* 67* 53* 50* 43*  ALT 23 26 27 28 25   ALKPHOS 58 66 74 75 78  BILITOT 0.5 0.6 0.6 0.3 0.3  PROT 6.0* 5.7* 6.0* 6.1* 6.3*  ALBUMIN 2.6* 2.4* 2.5* 2.4* 2.5*    CBG: Recent Labs  Lab 10/27/19 0010 10/27/19 0351 10/27/19 0746 10/27/19 1139 10/27/19 1531  GLUCAP 161* 147* 122* 116* 124*     No results found for this or any previous visit (from the past 240 hour(s)).       Radiology  Studies: No results found.      Scheduled Meds: . acetaminophen  500 mg Oral TID WC  . atenolol  100 mg Oral Daily  . capecitabine  1,500 mg Oral BID PC  . pulmozyme (DORNASE) for intrapleural administration  5 mg Intrapleural Once  . enoxaparin (LOVENOX) injection  40 mg Subcutaneous Q24H  . feeding supplement (ENSURE ENLIVE)  237 mL Oral TID BM  . fentaNYL  1 patch Transdermal Q72H  . fentaNYL  1 patch Transdermal Q72H  . hydrocortisone cream   Topical BID  . insulin aspart  0-15 Units Subcutaneous Q4H  . mouth rinse  15 mL Mouth Rinse BID  . polyethylene glycol  17 g Oral BID  . promethazine  12.5 mg Oral Q6H  . senna  2 tablet Oral BID  . sucralfate  1 g Oral TID WC & HS  . venlafaxine XR  37.5 mg Oral 1 day or 1 dose   Continuous Infusions:   LOS: 11 days    Time spent: over 30 min    Fayrene Helper, MD [facility]   To contact the attending provider between 7A-7P or the covering provider during after hours 7P-7A, please log into the web site [email or web address] and access using universal [place] password for that web site. If you do not have the password, please call the hospital operator.  10/27/2019, 5:19 PM

## 2019-10-27 NOTE — Progress Notes (Signed)
Patient has order to drain pleurx chest tube as needed. This RN asked patient if she wanted it to be drained, pt requested chest tube not be drained at this time. Pt is in stable condition, denies SOB, 02 saturation is 100% on 2L . Will continue to monitor.

## 2019-10-27 NOTE — Progress Notes (Signed)
Nutrition Follow-up  DOCUMENTATION CODES:   Not applicable  INTERVENTION:  - continue Ensure Enlive TID and snacks TID.   - consider trial of appetite stimulant, if not contraindicated.  - weigh patient today.   NUTRITION DIAGNOSIS:   Inadequate oral intake related to poor appetite as evidenced by meal completion < 50%. -ongoing  GOAL:   Patient will meet greater than or equal to 90% of their needs -unmet on average   MONITOR:   PO intake, Supplement acceptance, Weight trends, Labs, I & O's  ASSESSMENT:   Pt presented with opacification of L hemithorax w/ recent diagnosis of L malignant pleural effusion 2/2 breast cancer. PMH includes breast cancer, HTN, HLD, T2DM, asthma.  Per flow sheet documentation, she has been eating 0-25% of meals over the past 5 days with the most recent intakes being 10% of breakfast and 0% of dinner yesterday. She has been accepting Ensure ~50% of the time offered.   Patient reports ongoing poor appetite and that with snacks she will eat some bites at times, nothing consistent. She is aware of the importance of nutrition and adequate protein intake for healing. Denies abdominal pain/pressure/discomfort with intakes but continues to have intermittent nausea which further decreases desire to eat.   She has not been weighed since admission on 6/10.   Note from yesterday states likely plan to start treatment of/for metastatic breast cancer today (6/21).    Labs reviewed; CBGs: 161, 147, 122, 116 mg/dl, Phos: 4.8 mg/dl.  Medications reviewed; sliding scale novolog, 17 g miralax BID, 2 tablets senokot/day, 1 g carafate TID.    Diet Order:   Diet Order            Diet regular Room service appropriate? Yes; Fluid consistency: Thin  Diet effective now                 EDUCATION NEEDS:   Education needs have been addressed  Skin:  Skin Assessment: Reviewed RN Assessment  Last BM:  6/18  Height:   Ht Readings from Last 1 Encounters:   10/16/19 5\' 6"  (1.676 m)    Weight:   Wt Readings from Last 1 Encounters:  10/16/19 80.3 kg    Estimated Nutritional Needs:  Kcal:  1950-2150 Protein:  95-110 grams Fluid:  >1.95L     Jarome Matin, MS, RD, LDN, CNSC Inpatient Clinical Dietitian RD pager # available in [facility]  After hours/weekend pager # available in [facility]

## 2019-10-27 NOTE — Plan of Care (Signed)
  Problem: Health Behavior/Discharge Planning: Goal: Ability to manage health-related needs will improve Outcome: Progressing   

## 2019-10-28 ENCOUNTER — Inpatient Hospital Stay: Payer: 59 | Admitting: Adult Health

## 2019-10-28 ENCOUNTER — Inpatient Hospital Stay: Payer: 59

## 2019-10-28 DIAGNOSIS — G893 Neoplasm related pain (acute) (chronic): Secondary | ICD-10-CM

## 2019-10-28 DIAGNOSIS — R42 Dizziness and giddiness: Secondary | ICD-10-CM

## 2019-10-28 DIAGNOSIS — K5903 Drug induced constipation: Secondary | ICD-10-CM | POA: Insufficient documentation

## 2019-10-28 DIAGNOSIS — T402X5A Adverse effect of other opioids, initial encounter: Secondary | ICD-10-CM | POA: Insufficient documentation

## 2019-10-28 LAB — CBC WITH DIFFERENTIAL/PLATELET
Abs Immature Granulocytes: 0.11 10*3/uL — ABNORMAL HIGH (ref 0.00–0.07)
Basophils Absolute: 0 10*3/uL (ref 0.0–0.1)
Basophils Relative: 0 %
Eosinophils Absolute: 0 10*3/uL (ref 0.0–0.5)
Eosinophils Relative: 0 %
HCT: 25 % — ABNORMAL LOW (ref 36.0–46.0)
Hemoglobin: 8.3 g/dL — ABNORMAL LOW (ref 12.0–15.0)
Immature Granulocytes: 1 %
Lymphocytes Relative: 6 %
Lymphs Abs: 0.8 10*3/uL (ref 0.7–4.0)
MCH: 28.8 pg (ref 26.0–34.0)
MCHC: 33.2 g/dL (ref 30.0–36.0)
MCV: 86.8 fL (ref 80.0–100.0)
Monocytes Absolute: 0.9 10*3/uL (ref 0.1–1.0)
Monocytes Relative: 7 %
Neutro Abs: 11.1 10*3/uL — ABNORMAL HIGH (ref 1.7–7.7)
Neutrophils Relative %: 86 %
Platelets: 492 10*3/uL — ABNORMAL HIGH (ref 150–400)
RBC: 2.88 MIL/uL — ABNORMAL LOW (ref 3.87–5.11)
RDW: 14 % (ref 11.5–15.5)
WBC: 12.9 10*3/uL — ABNORMAL HIGH (ref 4.0–10.5)
nRBC: 0 % (ref 0.0–0.2)

## 2019-10-28 LAB — COMPREHENSIVE METABOLIC PANEL
ALT: 22 U/L (ref 0–44)
AST: 41 U/L (ref 15–41)
Albumin: 2.4 g/dL — ABNORMAL LOW (ref 3.5–5.0)
Alkaline Phosphatase: 76 U/L (ref 38–126)
Anion gap: 14 (ref 5–15)
BUN: 24 mg/dL — ABNORMAL HIGH (ref 6–20)
CO2: 22 mmol/L (ref 22–32)
Calcium: 9.5 mg/dL (ref 8.9–10.3)
Chloride: 102 mmol/L (ref 98–111)
Creatinine, Ser: 0.79 mg/dL (ref 0.44–1.00)
GFR calc Af Amer: >60 mL/min (ref >60)
GFR calc non Af Amer: >60 mL/min (ref >60)
Glucose, Bld: 139 mg/dL — ABNORMAL HIGH (ref 70–99)
Potassium: 4.4 mmol/L (ref 3.5–5.1)
Sodium: 138 mmol/L (ref 135–145)
Total Bilirubin: 0.8 mg/dL (ref 0.3–1.2)
Total Protein: 6.3 g/dL — ABNORMAL LOW (ref 6.5–8.1)

## 2019-10-28 LAB — GLUCOSE, CAPILLARY
Glucose-Capillary: 113 mg/dL — ABNORMAL HIGH (ref 70–99)
Glucose-Capillary: 126 mg/dL — ABNORMAL HIGH (ref 70–99)
Glucose-Capillary: 134 mg/dL — ABNORMAL HIGH (ref 70–99)
Glucose-Capillary: 138 mg/dL — ABNORMAL HIGH (ref 70–99)
Glucose-Capillary: 147 mg/dL — ABNORMAL HIGH (ref 70–99)
Glucose-Capillary: 158 mg/dL — ABNORMAL HIGH (ref 70–99)

## 2019-10-28 LAB — PHOSPHORUS: Phosphorus: 4.8 mg/dL — ABNORMAL HIGH (ref 2.5–4.6)

## 2019-10-28 LAB — MAGNESIUM: Magnesium: 1.7 mg/dL (ref 1.7–2.4)

## 2019-10-28 MED ORDER — CAPECITABINE 500 MG PO TABS
1500.0000 mg | ORAL_TABLET | Freq: Two times a day (BID) | ORAL | Status: AC
  Administered 2019-10-28 – 2019-11-10 (×28): 1500 mg via ORAL
  Filled 2019-10-28 (×4): qty 3

## 2019-10-28 MED ORDER — LORAZEPAM 2 MG/ML IJ SOLN
0.2500 mg | Freq: Four times a day (QID) | INTRAMUSCULAR | Status: DC | PRN
  Administered 2019-10-28 – 2019-11-10 (×2): 0.25 mg via INTRAVENOUS
  Filled 2019-10-28 (×2): qty 1

## 2019-10-28 MED ORDER — PROCHLORPERAZINE EDISYLATE 10 MG/2ML IJ SOLN
5.0000 mg | Freq: Four times a day (QID) | INTRAMUSCULAR | Status: DC | PRN
  Administered 2019-10-28 – 2019-11-05 (×3): 5 mg via INTRAVENOUS
  Filled 2019-10-28 (×3): qty 2

## 2019-10-28 MED ORDER — PROCHLORPERAZINE MALEATE 10 MG PO TABS: 5.0000 mg | ORAL_TABLET | Freq: Four times a day (QID) | ORAL | Status: DC | PRN

## 2019-10-28 NOTE — Progress Notes (Signed)
Daily Progress Note   Patient Name: Autumn Cook       Date: 10/28/2019 DOB: [birth year]  Age: 59 y.o. MRN#: [id number] Attending Physician: Elodia Florence., * Primary Care Physician: Darreld Mclean, MD Admit Date: 10/16/2019  Reason for Consultation/Follow-up: Non pain symptom management and Pain control  Subjective:  Autumn Cook is resting in bed, daughter at bedside. We discussed about patient's pain and non pain symptom management.     Length of Stay: 12  Current Medications: Scheduled Meds:   acetaminophen  500 mg Oral TID WC   atenolol  100 mg Oral Daily   capecitabine  1,500 mg Oral BID PC   pulmozyme (DORNASE) for intrapleural administration  5 mg Intrapleural Once   enoxaparin (LOVENOX) injection  40 mg Subcutaneous Q24H   feeding supplement (ENSURE ENLIVE)  237 mL Oral TID BM   fentaNYL  1 patch Transdermal Q72H   fentaNYL  1 patch Transdermal Q72H   hydrocortisone cream   Topical BID   insulin aspart  0-15 Units Subcutaneous Q4H   mouth rinse  15 mL Mouth Rinse BID   polyethylene glycol  17 g Oral BID   promethazine  12.5 mg Oral Q6H   senna  2 tablet Oral BID   sucralfate  1 g Oral TID WC & HS   venlafaxine XR  37.5 mg Oral 1 day or 1 dose   PRN Meds: ipratropium-albuterol, LORazepam, metoprolol tartrate, morphine injection, prochlorperazine **OR** prochlorperazine, sorbitol, traMADol  Physical Exam     General: Awake,  in no acute distress.  Heart: Regular rate and rhythm.  Lungs: Decreased Abdomen: Soft, non distended.  Ext: No significant edema Skin: Warm and dry      Vital Signs: BP (!) 157/84 (BP Location: Left Arm)    Pulse 95    Temp 99.3 F (37.4 C) (Oral)    Resp 20    Ht 5\' 6"  (1.676 m)    Wt 81.3 kg    LMP 11/14/2013     SpO2 94%    BMI 28.94 kg/m  SpO2: SpO2: 94 % O2 Device: O2 Device: Room Air O2 Flow Rate: O2 Flow Rate (L/min): 2 L/min  Intake/output summary:   Intake/Output Summary (Last 24 hours) at 10/28/2019 1516 Last data filed at 10/28/2019 1300 Gross per 24 hour  Intake 0 ml  Output 0 ml  Net 0 ml   LBM: Last BM Date: 10/24/19 Baseline Weight: Weight: 80.3 kg Most recent weight: Weight: 81.3 kg       Palliative Assessment/Data:  Patient Active Problem List   Diagnosis Date Noted   Cancer associated pain    Therapeutic opioid induced constipation    Generalized pain    General weakness    Palliative care by specialist    Acute respiratory failure ([place]) 10/16/2019   Diabetes mellitus type 2, uncontrolled, with complications ([place]) 43/15/4008   Acute respiratory failure with hypoxia ([place]) 10/16/2019   Pneumonia of right lower lobe due to infectious organism 10/16/2019   Metastases to the liver ([place]) 10/10/2019   Malignant pleural effusion 10/09/2019   Pleural effusion on left 10/09/2019   Type 2 diabetes mellitus with hyperglycemia ([place]) 10/09/2019   Encounter for central line placement 08/13/2018   Port-A-Cath in place 08/07/2018   Family history of lung cancer    Malignant neoplasm of overlapping sites of right breast in female, estrogen receptor positive ([place]) 07/23/2018   Achilles tendonitis, bilateral 06/11/2015   Chest pain with moderate risk for cardiac etiology 10/13/2014   Hypercholesterolemia 10/13/2014   Idiopathic scoliosis 08/18/2014   Hypertension 09/11/2012   Overweight (BMI 25.0-29.9) 09/11/2012   Migraine headache 09/11/2012   Hx of thyroid nodule 09/11/2012   History of tobacco use 09/11/2012    Palliative Care Assessment & Plan   Patient Profile: 58 year old female with metastatic breast cancer under the care of [name].  She is undergoing chemotherapy and radiation and is status post Pleurx catheter placement with cytology  revealing malignant cells.  Currently admitted for discomfort and shortness of breath and pulmonary working to increase drainage from Pleurx catheter.  Plan on discharge she is for follow-up with [name] for further treatment with capecitabine.  Palliative following for pain management.  Recommendations/Plan: -Pain:  Continue fentanyl patch 37.5 mcg/h. Discussed with daughter at bedside about current pain and non pain symptom management. Medication history noted.   -Constipation: Opioid related.  Continue Miralax and senna.    Goals of Care and Additional Recommendations:  Limitations on Scope of Treatment: Full Scope Treatment  Code Status:    Code Status Orders  (From admission, onward)         Start     Ordered   10/16/19 0803  Full code  Continuous        10/16/19 0808        Code Status History    Date Active Date Inactive Code Status Order ID Comments User Context   10/09/2019 2245 10/14/2019 2035 Full Code [id number]  Rise Patience, MD Inpatient   Advance Care Planning Activity       Prognosis:   Unable to determine  Discharge Planning:  Home with [facility] vs SNF for rehab  Care plan was discussed with patient's daughter.   Thank you for allowing the Palliative Medicine Team to assist in the care of this patient.    Total Time 15 Prolonged Time Billed No   Greater than 50%  of this time was spent counseling and coordinating care related to the above assessment and plan.  Loistine Chance, MD  Please contact Palliative Medicine Team phone at [phone] for questions and concerns.

## 2019-10-28 NOTE — Plan of Care (Signed)

## 2019-10-28 NOTE — Progress Notes (Addendum)
PROGRESS NOTE    Autumn Cook  FKC:[id number] DOB: Mar 03, 1961 DOA: 10/16/2019 PCP: Darreld Mclean, MD   Chief Complaint  Patient presents with  . Shortness of Breath  . Chest Pain    Brief Narrative:  Patient is Autumn Cook 59 year old female with history of right breast cancer, hypertension, hyperlipidemia, type 2 diabetes mellitus, asthma who presents to the emergency department with complaints of central sharp chest pain as well as shortness of breath. She was recently admitted here and discharged 2 days prior to presentation when she underwent Pleurx catheter placement by PCCM after she was found to have large left pleural effusion secondary to her breast cancer. Patient did not know how to use the Pleurx catheter at home. On presentation, chest x-ray showed complete white out of the left lung but there was also suspicion for new consolidations on the right side concerning for pneumonia for which she was started on antibiotics.  She was admitted for left sided malignant effusion.  She'd previously had pleurx placed at the previous admission, but was readmitted as did not know how to operate the pleurx at home and had persistent symptoms.  She's had teaching here at this point, but her symptoms are poorly controlled.  Palliative and oncology are following.    Assessment & Plan:   Active Problems:   Hypertension   History of tobacco use   Hypercholesterolemia   Malignant neoplasm of overlapping sites of right breast in female, estrogen receptor positive (Autumn Cook)   Encounter for central line placement   Malignant pleural effusion   Pleural effusion on left   Metastases to the liver [facility])   Acute respiratory failure ([facility])   Diabetes mellitus type 2, uncontrolled, with complications ([facility])   Acute respiratory failure with hypoxia ([facility])   Pneumonia of right lower lobe due to infectious organism   Generalized pain   General weakness   Palliative care by specialist   Cancer associated  pain   Therapeutic opioid induced constipation  Acute on chronic respiratory failure with hypoxia: Secondary to left-sided extensivemalignantpleural effusion. She is on 2 L of oxygen at home at baseline. She was recently admitted here and underwent left-sided Pleurx catheter but she did not know how to operate it at home. - Chest tube clamped per PCCM, resume 2x weekly drainage - has outpatient pulm f/u 7/8 at 0930 with Autumn Cook  - they've reviewed drainage with pt - pulm suspects element of trapped lung, won't be candidate for pleurodesis due to this - started capecitabine per oncology - CXR 6/15 with decrease in volume of L pleural effusion and improved aeration to the LUL and left midlung - CXR 6/17 with pleurx catheter on L and progression of large L effusion and left lower lobe collapse - 6/22 only 50 cc out - repeat CXR 6/23 - please discuss with pulm 6/23 - Oncology following, appreciate recommendations - Palliative care following for pain control, appreciate assistance, she continues to have issues with pain and nausea - continues to require freq IV morphine.  Fentanyl patch increased to 37.5.  Will schedule phenergan for nausea.  Tramadol prn.  Appreciate palliative care and oncology assistance.  Dizziness: likely related to opiates, follow with dose finding  Suspected HCAP:CXR onpresentation showed possible consolidations on the right side.She is not febrile or septic. She wasstarted on cefepime by admitting physician.Antibiotics have been stopped as there was very low suspicion for pneumonia. Procalcitonin also was negative.  Chest Pain: occurred 6/17-18 PM, EKG with NSR, resolved and  has not recurred, suspected related to her pleural effusion above  Malignantneoplasm of the right breast: Dr. Jana Cook is following. Capecitabine started.  Hypertension: Blood pressure stable at this time.  Continue atenolol.  Continue to monitor and adjust medications as  needed.  Diabetes mellitus: A1c of 7.8. Continue sliding scaleinsulin for now.She is not on any medications at home. Consider Metformin 500 mg twice Autumn Cook day on discharge.  Hypomagnesemia: Replace mag.  Potassium stable.  Goals of care:Palliative care consulted for pain management, goals of care.  Pain control- appreciate palliative care  Left IJ - d/c'd  ? Contact dermatitis: hydrocortisone ordered, not sure if this is related to pleurx dressing, it's only along bottom of dressing.  Continue to monitor.  DVT prophylaxis: (lovenox Code Status: full  Family Communication: daughter at bedside Disposition:   Status is: Inpatient  Remains inpatient appropriate because:Inpatient level of care appropriate due to severity of illness   Dispo: The patient is from: Home              Anticipated d/c is to: Home              Anticipated d/c date is: 2 days              Patient currently is not medically stable to d/c.   Consultants:   PCCM  Pulm  Oncology  Procedures:  none  Antimicrobials: Anti-infectives (From admission, onward)   Start     Dose/Rate Route Frequency Ordered Stop   10/16/19 1400  ceFEPIme (MAXIPIME) 2 g in sodium chloride 0.9 % 100 mL IVPB  Status:  Discontinued        2 g 200 mL/hr over 30 Minutes Intravenous Every 8 hours 10/16/19 0817 10/17/19 1426   10/16/19 0530  vancomycin (VANCOCIN) IVPB 1000 mg/200 mL premix        1,000 mg 200 mL/hr over 60 Minutes Intravenous  Once 10/16/19 0520 10/16/19 0741   10/16/19 0530  ceFEPIme (MAXIPIME) 2 g in sodium chloride 0.9 % 100 mL IVPB        2 g 200 mL/hr over 30 Minutes Intravenous  Once 10/16/19 0520 10/16/19 0741      Subjective: Frustrated with continued symptoms  Objective: Vitals:   10/27/19 1854 10/27/19 2234 10/28/19 0406 10/28/19 1622  BP:  135/86 (!) 157/84 (!) 147/78  Pulse:  86 95 87  Resp:  20 20 19   Temp:  98 F (36.7 C) 99.3 F (37.4 C) 98.4 F (36.9 C)  TempSrc:  Oral Oral  Oral  SpO2:  100% 94% 97%  Weight: 83.1 kg  81.3 kg   Height:        Intake/Output Summary (Last 24 hours) at 10/28/2019 1829 Last data filed at 10/28/2019 1300 Gross per 24 hour  Intake 0 ml  Output 0 ml  Net 0 ml   Filed Weights   10/16/19 0431 10/27/19 1854 10/28/19 0406  Weight: 80.3 kg 83.1 kg 81.3 kg    Examination:  General: No acute distress. Cardiovascular: Heart sounds show Yoshino Broccoli regular rate, and rhythm. Lungs: L pleurx, unlabored Abdomen: Soft, nontender, nondistended Neurological: Alert and oriented 3. Moves all extremities 4Cranial nerves II through XII grossly intact. Skin: Warm and dry. No rashes or lesions. Extremities: No clubbing or cyanosis. No edema.   Data Reviewed: I have personally reviewed following labs and imaging studies  CBC: Recent Labs  Lab 10/24/19 0524 10/25/19 0606 10/26/19 0800 10/27/19 0500 10/28/19 0517  WBC 10.5 8.4 9.5 9.5 12.9*  NEUTROABS 8.8* 6.5 7.8* 8.1* 11.1*  HGB 9.0* 8.3* 8.7* 8.2* 8.3*  HCT 28.0* 25.6* 26.7* 25.0* 25.0*  MCV 89.5 88.6 90.2 88.3 86.8  PLT 381 402* 433* 449* 492*    Basic Metabolic Panel: Recent Labs  Lab 10/23/19 0509 10/23/19 0509 10/24/19 0524 10/25/19 0606 10/25/19 0616 10/26/19 0542 10/27/19 0500 10/28/19 0517  NA 139   < > 139  --  137 137 136 138  K 4.1   < > 4.6  --  4.4 4.2 4.3 4.4  CL 106   < > 105  --  101 104 100 102  CO2 24   < > 23  --  25 25 22 22   GLUCOSE 158*   < > 140*  --  119* 151* 154* 139*  BUN 27*   < > 20  --  20 18 20  24*  CREATININE 0.79   < > 0.73  --  0.72 0.70 0.69 0.79  CALCIUM 8.9   < > 9.1  --  8.9 9.1 9.3 9.5  MG 1.6*   < > 1.9 1.7  --  1.6* 1.7 1.7  PHOS 2.8  --  3.6  --   --  3.7 4.8* 4.8*   < > = values in this interval not displayed.    GFR: Estimated Creatinine Clearance: 83.4 mL/min (by C-G formula based on SCr of 0.79 mg/dL).  Liver Function Tests: Recent Labs  Lab 10/23/19 0509 10/24/19 0524 10/26/19 0542 10/27/19 0500 10/28/19 0517  AST  67* 53* 50* 43* 41  ALT 26 27 28 25 22   ALKPHOS 66 74 75 78 76  BILITOT 0.6 0.6 0.3 0.3 0.8  PROT 5.7* 6.0* 6.1* 6.3* 6.3*  ALBUMIN 2.4* 2.5* 2.4* 2.5* 2.4*    CBG: Recent Labs  Lab 10/28/19 0009 10/28/19 0402 10/28/19 0728 10/28/19 1149 10/28/19 1620  GLUCAP 138* 126* 113* 158* 134*     No results found for this or any previous visit (from the past 240 hour(s)).       Radiology Studies: No results found.      Scheduled Meds: . acetaminophen  500 mg Oral TID WC  . atenolol  100 mg Oral Daily  . capecitabine  1,500 mg Oral BID PC  . pulmozyme (DORNASE) for intrapleural administration  5 mg Intrapleural Once  . enoxaparin (LOVENOX) injection  40 mg Subcutaneous Q24H  . feeding supplement (ENSURE ENLIVE)  237 mL Oral TID BM  . fentaNYL  1 patch Transdermal Q72H  . fentaNYL  1 patch Transdermal Q72H  . hydrocortisone cream   Topical BID  . insulin aspart  0-15 Units Subcutaneous Q4H  . mouth rinse  15 mL Mouth Rinse BID  . polyethylene glycol  17 g Oral BID  . promethazine  12.5 mg Oral Q6H  . senna  2 tablet Oral BID  . sucralfate  1 g Oral TID WC & HS  . venlafaxine XR  37.5 mg Oral 1 day or 1 dose   Continuous Infusions:   LOS: 12 days    Time spent: over 30 min    Fayrene Helper, MD [facility]   To contact the attending provider between 7A-7P or the covering provider during after hours 7P-7A, please log into the web site [email or web address] and access using universal [place] password for that web site. If you do not have the password, please call the hospital operator.  10/28/2019, 6:29 PM

## 2019-10-28 NOTE — Progress Notes (Signed)
Autumn Cook   DOB:May 13, 1960   YY#:[id number]   WUG#:[id number]  Subjective:  Autumn Cook seems to me not appreciably better or worse; she is still requiring frequent breakthrough pain meds; pretty much stays in bed all day; no BM in 72 hrs; reviewed situation with patient and daughter  Objective: African American woman examined in bed Vitals:   10/27/19 2234 10/28/19 0406  BP: 135/86 (!) 157/84  Pulse: 86 95  Resp: 20 20  Temp: 98 F (36.7 C) 99.3 F (37.4 C)  SpO2: 100% 94%    Body mass index is 28.94 kg/m.  Intake/Output Summary (Last 24 hours) at 10/28/2019 0740 Last data filed at 10/28/2019 0600 Gross per 24 hour  Intake 0 ml  Output 0 ml  Net 0 ml    Lungs no rales or rhonchi Heart regular rate and rhythm Abd soft, nontender Neuro: nonfocal Breasts: Deferred    CBG (last 3)  Recent Labs    10/28/19 0009 10/28/19 0402 10/28/19 0728  GLUCAP 138* 126* 113*     Labs:  Lab Results  Component Value Date   WBC 12.9 (H) 10/28/2019   HGB 8.3 (L) 10/28/2019   HCT 25.0 (L) 10/28/2019   MCV 86.8 10/28/2019   PLT 492 (H) 10/28/2019   NEUTROABS 11.1 (H) 10/28/2019    _0 @  Urine Studies No results for input(s): UHGB, CRYS in the last 72 hours.  Invalid input(s): UACOL, UAPR, USPG, UPH, UTP, UGL, UKET, UBIL, UNIT, UROB, ULEU, UEPI, UWBC, URBC, UBAC, CAST, [place], [place]  Basic Metabolic Panel: Recent Labs  Lab 10/23/19 0509 10/23/19 0509 10/24/19 0524 10/24/19 0524 10/25/19 0606 10/25/19 8882 10/25/19 0616 10/26/19 0542 10/26/19 0542 10/27/19 0500 10/28/19 0517  NA 139   < > 139  --   --  137  --  137  --  136 138  K 4.1   < > 4.6   < >  --  4.4   < > 4.2   < > 4.3 4.4  CL 106   < > 105  --   --  101  --  104  --  100 102  CO2 24   < > 23  --   --  25  --  25  --  22 22  GLUCOSE 158*   < > 140*  --   --  119*  --  151*  --  154* 139*  BUN 27*   < > 20  --   --  20  --  18  --  20 24*  CREATININE 0.79   < > 0.73  --   --  0.72  --  0.70  --   0.69 0.79  CALCIUM 8.9   < > 9.1  --   --  8.9  --  9.1  --  9.3 9.5  MG 1.6*   < > 1.9  --  1.7  --   --  1.6*  --  1.7 1.7  PHOS 2.8  --  3.6  --   --   --   --  3.7  --  4.8* 4.8*   < > = values in this interval not displayed.   GFR Estimated Creatinine Clearance: 83.4 mL/min (by C-G formula based on SCr of 0.79 mg/dL). Liver Function Tests: Recent Labs  Lab 10/23/19 0509 10/24/19 0524 10/26/19 0542 10/27/19 0500 10/28/19 0517  AST 67* 53* 50* 43* 41  ALT _1 ALKPHOS 66 74 75  78 76  BILITOT 0.6 0.6 0.3 0.3 0.8  PROT 5.7* 6.0* 6.1* 6.3* 6.3*  ALBUMIN 2.4* 2.5* 2.4* 2.5* 2.4*   No results for input(s): LIPASE, AMYLASE in the last 168 hours. No results for input(s): AMMONIA in the last 168 hours. Coagulation profile No results for input(s): INR, PROTIME in the last 168 hours.  CBC: Recent Labs  Lab 10/24/19 0524 10/25/19 0606 10/26/19 0800 10/27/19 0500 10/28/19 0517  WBC 10.5 8.4 9.5 9.5 12.9*  NEUTROABS 8.8* 6.5 7.8* 8.1* 11.1*  HGB 9.0* 8.3* 8.7* 8.2* 8.3*  HCT 28.0* 25.6* 26.7* 25.0* 25.0*  MCV 89.5 88.6 90.2 88.3 86.8  PLT 381 402* 433* 449* 492*   Cardiac Enzymes: No results for input(s): CKTOTAL, CKMB, CKMBINDEX, TROPONINI in the last 168 hours. BNP: Invalid input(s): POCBNP CBG: Recent Labs  Lab 10/27/19 1531 10/27/19 1940 10/28/19 0009 10/28/19 0402 10/28/19 0728  GLUCAP 124* 131* 138* 126* 113*   D-Dimer No results for input(s): DDIMER in the last 72 hours. Hgb A1c No results for input(s): HGBA1C in the last 72 hours. Lipid Profile No results for input(s): CHOL, HDL, LDLCALC, TRIG, CHOLHDL, LDLDIRECT in the last 72 hours. Thyroid function studies No results for input(s): TSH, T4TOTAL, T3FREE, THYROIDAB in the last 72 hours.  Invalid input(s): FREET3 Anemia work up No results for input(s): VITAMINB12, FOLATE, FERRITIN, TIBC, IRON, RETICCTPCT in the last 72 hours. Microbiology No results found for this or any previous visit  (from the past 240 hour(s)).    Studies:  No results found.  Assessment: 59 y.o. [place], [place] woman status post right breast biopsy x2 on 07/10/2018 for multicentric invasive ductal carcinoma, clinically T1c N1, stage IIB, grade 3, functionally triple negative, with an MIB-1 of 70% (a) right axillary lymph node biopsied at the same time was positive  (1) neoadjuvant chemotherapy consisting of doxorubicin and cyclophosphamide in dose dense fashion x4 started 08/07/2018, completed 09/16/2018, followed by weekly Abraxane and carboplatin x12 starting 08/07/2018, stopped after 2 cycles (last dose on 10/15/2018) due to peripheral neuropathy   (2) Right breast lumpectomy on 12/27/2018 shows a ypT1b ypN0 residual invasive ductal carcinoma, margins negative. (a) 5 sentinel nodes were negative.  (b) Estrogen receptor 75% positive, weak, Progesterone receptor negative.  (3) adjuvant radiation  Radiation Treatment Dates: 02/18/2019 through 04/02/2019 Site Technique Total Dose (Gy) Dose per Fx (Gy) Completed Fx Beam Energies  Breast: Breast_Rt 3D 50.4/50.4 1.8 28/28 6X, 10X  Breast: Breast_Rt_axilla 3D 50.4/50.4 1.8 28/28 6X, 10X  Breast: Breast_Rt_Bst 3D 20/20 2 10/10 6X, 10X    (4) genetics testing 08/06/2018 through the Breast + GYN Cancers Panel offered by [facility] found no deleterious mutationsin ATM, BARD1, BRCA1, BRCA2, BRIP1, CDH1, CHEK2, DICER1, EPCAM, MLH1, MSH2, MSH6, NBN, NF1, PALB2, PMS2, PTEN, RAD50, RAD51C, RAD51D, SMARCA4, STK11, TP53.   (5) anastrozole started 05/14/2018 (a) bone density 09/22/2019 shows a T score of -0.8 (normal).  METASTATIC DISEASE: June 2021 (6)CT renalstonestudy 09/30/2019 to evaluate left flank pain shows left pleural effusion and possible liver lesions (a)cytology from the left pleural effusion 10/03/2019 confirms metastatic disease (b)liver MRI 10/04/2019 shows multiple  liver lesions (c) prognostic panel requested 10/10/2019 on cytology from 10/03/2019 flui  (7) left effusion: left pleurx placement 10/13/2019  (a) loculated, not a candidate for pleurodesis per pulmonary, would need decortication  (8) pain: currently on baseline fentanyl 25 mcg/h patch  (a) breakthrough opiates and naproxen/tylenol  (b) bowel prophylaxis--MiraLAX Colace, sorbitol as needed  (9) capecitabine 1500 mg po BID 14 days on,  7 days off to start 10/28/2019   Plan:  Autumn Cook is still requiring frequent breakthrough pain meds; she is anxious and to her daughter appears depressed; still constipated.  We discussed again that the goal of pain meds is increased activity, not "no pain." I encouraged her to get OOB to recliner and walk with assistance. She remains dizzy when walking and is high fall risk.  She wishes she were at home but understands if she went today she would be readmitted shortly as we are not yet at a point where she and her family can manage her symptoms.  I think we should start her capecitabine today. We reveiwed the possible side effects, complications and toxicities. Generally this is tolerated well, but may cause severe diarrhea and mouth sores and after prolonged use palmar plantar erythrodysesthesia, but usually hair loss, nausea and fatigue are mild to minimal.  Will follow closely with you   Chauncey Cruel, MD 10/28/2019  7:40 AM Medical Oncology and Hematology [facility] [place], [place] [address] Tel. [phone]    Fax. [phone]

## 2019-10-28 NOTE — Progress Notes (Signed)
Pleurx catheter drained less than 50cc of serosanginous drainage. Pt tolerated procedure well.  Will continue to monitor. Levorn Oleski, Laurel Dimmer, RN

## 2019-10-28 NOTE — Progress Notes (Signed)
Daily Progress Note   Patient Name: Autumn Cook       Date: 10/28/2019 DOB: 10-01-1960  Age: 59 y.o. MRN#: [id number] Attending Physician: Elodia Florence., * Primary Care Physician: Darreld Mclean, MD Admit Date: 10/16/2019  Reason for Consultation/Follow-up: Non pain symptom management and Pain control  Subjective: I saw and examined Autumn Cook.  Her daughters were at the bedside on my encounter.  Reports that nausea and pain have worsened and she has required more rescue dosing over the last 24 hours.   We discussed options for short acting medications moving forward including continuation of oral morphine vs trial of tramadol (dilaudid and oxy make her very nauseous.  We talked about her overall opioid usage of the last 24 hours.  We discussed up titration of patch again and she would like to uptitrate her fentanyl patch to 37.5 mcg/h to see how she does.  We also discussed her bowel regimen.  Has been 3 days since her last bowel movement.  Open to increase in senna, but when I mentioned possible suppository or enema, she deferred conversation until tomorrow.  Length of Stay: 12  Current Medications: Scheduled Meds:  . acetaminophen  500 mg Oral TID WC  . atenolol  100 mg Oral Daily  . capecitabine  1,500 mg Oral BID PC  . pulmozyme (DORNASE) for intrapleural administration  5 mg Intrapleural Once  . enoxaparin (LOVENOX) injection  40 mg Subcutaneous Q24H  . feeding supplement (ENSURE ENLIVE)  237 mL Oral TID BM  . fentaNYL  1 patch Transdermal Q72H  . fentaNYL  1 patch Transdermal Q72H  . hydrocortisone cream   Topical BID  . insulin aspart  0-15 Units Subcutaneous Q4H  . mouth rinse  15 mL Mouth Rinse BID  . polyethylene glycol  17 g Oral BID  .  promethazine  12.5 mg Oral Q6H  . senna  2 tablet Oral BID  . sucralfate  1 g Oral TID WC & HS  . venlafaxine XR  37.5 mg Oral 1 day or 1 dose   PRN Meds: ipratropium-albuterol, metoprolol tartrate, morphine injection, prochlorperazine, sorbitol, traMADol  Physical Exam     General: Awake,  in no acute distress.  Heart: Regular rate and rhythm.  Lungs: Decreased Abdomen: Soft, non distended.  Ext: No  significant edema Skin: Warm and dry      Vital Signs: BP 135/86 (BP Location: Left Arm)   Pulse 86   Temp 98 F (36.7 C) (Oral)   Resp 20   Ht 5\' 6"  (1.676 m)   Wt 83.1 kg   LMP 11/14/2013   SpO2 100%   BMI 29.55 kg/m  SpO2: SpO2: 100 % O2 Device: O2 Device: Nasal Cannula O2 Flow Rate: O2 Flow Rate (L/min): 2 L/min  Intake/output summary:   Intake/Output Summary (Last 24 hours) at 10/28/2019 0104 Last data filed at 10/27/2019 2235 Gross per 24 hour  Intake 0 ml  Output 0 ml  Net 0 ml   LBM: Last BM Date: 10/24/19 Baseline Weight: Weight: 80.3 kg Most recent weight: Weight: 83.1 kg       Palliative Assessment/Data:  Patient Active Problem List   Diagnosis Date Noted  . Generalized pain   . General weakness   . Palliative care by specialist   . Acute respiratory failure ([place]) 10/16/2019  . Diabetes mellitus type 2, uncontrolled, with complications ([place]) 26/37/8588  . Acute respiratory failure with hypoxia ([place]) 10/16/2019  . Pneumonia of right lower lobe due to infectious organism 10/16/2019  . Metastases to the liver ([place]) 10/10/2019  . 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 Pleural effusion on left 10/09/2019  . Type 2 diabetes mellitus with hyperglycemia ([place]) 10/09/2019  . Encounter for central line placement 08/13/2018  . Port-A-Cath in place 08/07/2018  . Family history of lung cancer   . Malignant neoplasm of overlapping sites of right breast in female, estrogen receptor positive ([place]) 07/23/2018  . Achilles tendonitis, bilateral 06/11/2015  .  Chest pain with moderate risk for cardiac etiology 10/13/2014  . Hypercholesterolemia 10/13/2014  . Idiopathic scoliosis 08/18/2014  . Hypertension 09/11/2012  . Overweight (BMI 25.0-29.9) 09/11/2012  . Migraine headache 09/11/2012  . Hx of thyroid nodule 09/11/2012  . History of tobacco use 09/11/2012    Palliative Care Assessment & Plan   Patient Profile: 59 year old female with metastatic breast cancer under the care of [name].  She is undergoing chemotherapy and radiation and is status post Pleurx catheter placement with cytology revealing malignant cells.  Currently admitted for discomfort and shortness of breath and pulmonary working to increase drainage from Pleurx catheter.  Plan on discharge she is for follow-up with [name] for further treatment with capecitabine.  Palliative following for pain management.  Recommendations/Plan: -Pain:  Increase fentanyl patch 37.5 mcg/h.  She reports oral morphine making her nauseous and would like to trial tramadol as rescue medication.  Emphasized need to take medication as soon as she begins to have pain rather than waiting as it does take longer for the onset of relief with oral medication versus IV.  We also discussed need to pretreat prior to activities that are going to cause pain.  -Constipation: Opioid related.  Continue Miralax.  Increase senna.  She deferred consideration for enema or suppository.  Goals of Care and Additional Recommendations:  Limitations on Scope of Treatment: Full Scope Treatment  Code Status:    Code Status Orders  (From admission, onward)         Start     Ordered   10/16/19 0803  Full code  Continuous        10/16/19 0808        Code Status History    Date Active Date Inactive Code Status Order ID Comments User Context   10/09/2019 2245 10/14/2019 2035 Full  Code [id number]  Rise Patience, MD Inpatient   Advance Care Planning Activity       Prognosis:   Unable to  determine  Discharge Planning:  Home with Home Health vs SNF for rehab  Care plan was discussed with patient, RN  Thank you for allowing the Palliative Medicine Team to assist in the care of this patient.    Total Time 30 Prolonged Time Billed No   Greater than 50%  of this time was spent counseling and coordinating care related to the above assessment and plan.  Micheline Rough, MD  Please contact Palliative Medicine Team phone at [phone] for questions and concerns.

## 2019-10-28 NOTE — TOC Benefit Eligibility Note (Signed)
Transition of Care [facility]) Benefit Eligibility Note    Patient Details  Name: Autumn Cook MRN: [id number] Date of Birth: Oct 23, 1960   Medication/Dose: NA        Prescription Coverage Preferred Pharmacy: NA  Spoke with Person/Company/Phone Number:: [facility] [phone] for network provders for [facility]  Co-Pay: NA        Additional Notes: HH    Makyla Bye, Juliann Pulse, RN Phone Number: 10/28/2019, 2:32 PM

## 2019-10-28 NOTE — TOC Progression Note (Signed)
Transition of Care [place] [facility]) - Progression Note    Patient Details  Name: JAYLEN CLAUDE MRN: [id number] Date of Birth: [birth year]  Transition of Care [facility]) CM/SW Contact  Kennet Mccort, Juliann Pulse, RN Phone Number: 10/28/2019, 5:12 PM  Clinical Narrative: Provided patint/dtrs [name] w/SNF bed offers-they have declined SNF they want home w/HHC/dme-they have [place] duty services set up for nursing intermittent home visits-they do not provide HHPT-dtrs voiced understanding-they will get HHPT on their own. DME needed hospital bed,light weight w/c w/cushion,rw,3n1. MD notified.      Expected Discharge Plan: [place] Barriers to Discharge: No [place] will accept this patient  Expected Discharge Plan and Services Expected Discharge Plan: [place]   Discharge Planning Services: CM Consult Post Acute Care Choice: Home Health, Durable Medical Equipment Living arrangements for the past 2 months: Single Family Home Expected Discharge Date:  (unknown)                                     Social Determinants of Health (SDOH) Interventions    Readmission Risk Interventions No flowsheet data found.

## 2019-10-28 NOTE — TOC Progression Note (Signed)
Transition of Care [facility]) - Progression Note    Patient Details  Name: Autumn Cook MRN: [id number] Date of Birth: [birth year]  Transition of Care [facility]) CM/SW Contact  Shakyla Nolley, Juliann Pulse, RN Phone Number: 10/28/2019, 2:28 PM  Clinical Narrative:                     U.S. flag   An official website of the [employer]   Here's how you know  Quarry manager a [place]  Log in Icon Log in  menu   Back   Print               My Location                 Nursing homes    Provider Type                Name of facility (optional)     Search       Filter by:                Distance: 25 mi.                   Overall rating                   Other ratings                   # of certified beds                   More filters       Clear all filters                 Showing 1 - 15 of 45 nursing homes    Sort by:   Closest      1.  1.7 mi     [facility] [place]  [place], [place] [address]  [phone]                Overall rating    Below average  Compare    2.  2 mi     [facility] [place], [place] [address]  [phone]                Overall rating    Below average  Compare    3.  2.1 mi     [facility] [place], [place] [address]  [phone]                Overall rating    Much below average  Compare    4.  2.2 mi     [facility]        [place], [place] [address]  [phone]                Overall rating    Much above average  Compare          5.  2.2 mi     [facility]  [place]        [place], [place] [address]  [phone]                Overall rating    Average  Compare    6.  2.7 mi     [facility]        [place], [place] [address]  [phone]                Overall rating    Above average  Compare    7.  3.2 mi     [facility]        [address]  [place], [place] [address]  [phone]                Overall rating    Much below average  Compare    8.  3.3 [place]        [address] [place], [place] [address]  [phone]                Overall rating    Below average  Compare    9.  3.5 mi     [facility]        [place], [place] [address]  [phone]                Overall rating    Average  Compare    10.  4.3 [place], [place] [address]  [phone]                Overall rating    Below average  Compare    11.  4.7 mi     [facility] [place], [place] [address]  [phone]                Overall rating    Much above average  Compare    12.  5.2 mi     [facility]        [address] [place], [place] [address]  [phone]                Overall rating    Much above average  Compare    13.  6.2 mi     [facility]        [address]  [place], [place] [address]  [phone]                Overall rating    Average  Compare    14.  8.1 [place]        [place], [place] [address]  [phone]                Overall rating    Above average  Compare    15.  8.1 mi     [facility]  [place], [place] [address]  [phone]  Overall rating    Much below average  Compare   <Previous  1 2 3  All Next>   Data last updated: Oct 01, 2019  To explore and download nursing home data,visit the data catalog on CMS.gov    Search this area    Next steps for choosing a nursing home      15   14   13   12   11   10   9   8   7   6   5   4   3   2   1              Mapbox  OpenStreetMap Improve this map        Consumer alert  Nursing homes that have been cited for potential issues related to abuse have the following icon next to their name:   More information about this, and what to do if you suspect abuse has occurred can be found here     About MedicareMedicare Glossary  Nondiscrimination/AccessibilityPrivacy [place] this [place] website managed and paid for by the U.[place]. Centers for [employer] and [employer]. [place], [place] [address]   Medicare.gov                             U.S. flag   An official website of the [place]   Here's how you know  Quarry manager a [place]  Log in Icon Log  in  menu   Back   Print               My Location                 Nursing homes    Provider Type                Name of facility (optional)     Search       Filter by:                Distance: 25 mi.                   Overall rating                   Other ratings                   # of certified beds                   More filters       Clear all filters                 Showing 16 - 30 of 45 nursing homes    Sort by:   Closest      16.  9.7 mi     [facility]        [address] [place], [place] [address]  [phone]                Overall rating    Below average  Compare    17.  10 mi  [facility]        [place], [place] [address]  [phone]                Overall rating    Much above average  Compare    18.  11.4 mi     [facility]        [address]  [place], [place] [address]  [phone]                Overall rating    Much above average  Compare    19.  13.5 [facility]        [place], [place] [address]  [phone]                Overall rating    Much below average  Compare          20.  13.5 mi     [facility]        [address]  [place], [place] [address]  [phone]                Overall rating    Much below average  Compare    21.  13.8 mi     [facility]        [address]  [place], [place] [address]  [phone]                Overall rating    Much below  average  Compare    22.  14.5 mi     [facility]        [address]  [place], [place] [address]  [phone]                Overall rating    Much below average  Compare    23.  14.9 mi     [facility]        [address]  [place], [place] [address]  [phone]                Overall rating    Much below average  Compare    24.  15.1 mi     [facility] at [place], [place] [address]  [phone]                Overall rating    Below average  Compare    25.  15.3 mi     [place]        [place], [place] [address]  [phone]                Overall rating    Above average  Compare    26.  [place]        [place],  [address]  [phone]                Overall rating    Much above average  Compare    27.  16.2 [place]        [address]  [place], [place] [address]  [phone]                Overall rating    Much above average  Compare    28.  16.6 mi     [facility]        [address] [place], [place] [address]  [phone]                Overall rating    Below average  Compare    29.  17.2 mi     [facility]        [place], [place] [address]  [phone]                Overall rating    Below average  Compare    30.  81.2 [facility]        [address]  [place], [place] [address]  [phone]                         U.S. flag   An official website of the [place]   Here's how you know  Quarry manager a [place] in [place] in  menu   Back   Print               My Location                 Nursing homes    Provider Type                Name of facility (optional)     Search       Filter by:                Distance: 25 mi.                   Overall rating                   Other ratings                   # of certified beds                   More filters       Clear all filters                 Showing 31 - 45 of 45 nursing homes    Sort by:   Closest      31.  19.4 mi     [facility] [employer] at [facility], [place] [address]  [phone]                Overall rating    Much above average  Compare    32.  20.6 [place]        [place], [place] [address]  [phone]                Overall rating    Below average  Compare    33.  20.7  [facility]        [place], [place] [address]  [phone]                Overall rating    Average  Compare    34.  00.7 [facility]        [address]  [place], [place] [address]  [phone]                Overall rating    Below average  Compare          35.  21.1 [place]  [place], [place] [address]  [phone]                Overall rating    Much above average  Compare    36.  [facility]        [address]  [place], [place] [address]  [phone]                Overall rating    Below average  Compare    37.  22.4 mi     [address]        [place], [place] [address]  [phone]                Overall rating    Below average  Compare    38.  22.7 mi     [facility]        [address]  [place], [place] [address]  [phone]                Overall rating    Above average  Compare    39.  22.8 [address]        [address]  [place], [place] [address]  [phone]                Overall rating    Much above average  Compare    40.  22.9 [place], [place] [address]  [phone]                Overall rating    Average  Compare    70.  [place]        [place], [place] [address]  [phone]                Overall rating    Much below average  Compare    42.  24.4 mi     [facility]        [address]  [place],  [address]  [phone]                Overall rating    Much below average  Compare    43.  24.6 [facility]        [address]  [place],  [address]  [phone]  Overall rating    Much below average  Compare    44.  24.6 mi     [facility] [place], [place] [address]  [phone]                Overall rating    Much below average  Compare    45.  24.9 mi     [place]        [place], [place] [address]  [phone]                Overall rating    Much below average  Compare   <Previous  1 2 3  All Next>   Data last updated: Oct 01, 2019  To explore and download nursing home data,visit the data catalog on CMS.gov    Search this area     45   44   43   42   41   40   39   38   37   36   35   34   33   32   31             Mapbox  OpenStreetMap Improve this map        Consumer alert  Nursing homes that have been cited for potential issues related to abuse have the following icon next to their name:   More information about this, and what to do if you suspect abuse has occurred can be found here     About MedicareMedicare Glossary  Nondiscrimination/AccessibilityPrivacy [place] this [place] website managed and paid for by the U.[place]. Centers for [employer] and [employer]. [place], [place] [address]   Medicare.gov                                                     Overall rating    Much below average  Compare   <Previous  1 2 3  All Next>   Data last updated: Oct 01, 2019  To explore and download nursing home data,visit the data catalog on CMS.gov                   Mapbox  OpenStreetMap Improve this map        Consumer alert  Nursing homes that have been cited for potential issues related to abuse have the following icon next to their name:   More information about this, and what to do if you suspect abuse has occurred can be found here     About  MedicareMedicare Glossary  Nondiscrimination/AccessibilityPrivacy [place] this [place] website managed and paid for by the U.S. Centers for [employer] and [employer]. [place], [place] [address]   Medicare.gov                                    An internal error occurred; please try again later.       Feedback  An internal error occurred; please try again later.       Feedback     Expected Discharge Plan: [place] Barriers to Discharge: No [place] will accept this patient  Expected Discharge Plan and Services Expected Discharge Plan: [place]   Discharge Planning Services: CM Consult   Living arrangements for the past 2 months: Single Family Home Expected Discharge Date:  (unknown)                                     Social Determinants of Health (SDOH) Interventions    Readmission Risk Interventions No flowsheet data found.

## 2019-10-29 ENCOUNTER — Inpatient Hospital Stay (HOSPITAL_COMMUNITY): Payer: 59

## 2019-10-29 LAB — GLUCOSE, CAPILLARY
Glucose-Capillary: 114 mg/dL — ABNORMAL HIGH (ref 70–99)
Glucose-Capillary: 125 mg/dL — ABNORMAL HIGH (ref 70–99)
Glucose-Capillary: 126 mg/dL — ABNORMAL HIGH (ref 70–99)
Glucose-Capillary: 130 mg/dL — ABNORMAL HIGH (ref 70–99)
Glucose-Capillary: 131 mg/dL — ABNORMAL HIGH (ref 70–99)
Glucose-Capillary: 134 mg/dL — ABNORMAL HIGH (ref 70–99)
Glucose-Capillary: 147 mg/dL — ABNORMAL HIGH (ref 70–99)

## 2019-10-29 LAB — COMPREHENSIVE METABOLIC PANEL
ALT: 21 U/L (ref 0–44)
AST: 46 U/L — ABNORMAL HIGH (ref 15–41)
Albumin: 2.3 g/dL — ABNORMAL LOW (ref 3.5–5.0)
Alkaline Phosphatase: 76 U/L (ref 38–126)
Anion gap: 17 — ABNORMAL HIGH (ref 5–15)
BUN: 35 mg/dL — ABNORMAL HIGH (ref 6–20)
CO2: 22 mmol/L (ref 22–32)
Calcium: 9.4 mg/dL (ref 8.9–10.3)
Chloride: 99 mmol/L (ref 98–111)
Creatinine, Ser: 1.02 mg/dL — ABNORMAL HIGH (ref 0.44–1.00)
GFR calc Af Amer: >60 mL/min (ref >60)
GFR calc non Af Amer: >60 mL/min (ref >60)
Glucose, Bld: 135 mg/dL — ABNORMAL HIGH (ref 70–99)
Potassium: 4.5 mmol/L (ref 3.5–5.1)
Sodium: 138 mmol/L (ref 135–145)
Total Bilirubin: 0.6 mg/dL (ref 0.3–1.2)
Total Protein: 6.5 g/dL (ref 6.5–8.1)

## 2019-10-29 LAB — CBC WITH DIFFERENTIAL/PLATELET
Abs Immature Granulocytes: 0.17 10*3/uL — ABNORMAL HIGH (ref 0.00–0.07)
Basophils Absolute: 0 10*3/uL (ref 0.0–0.1)
Basophils Relative: 0 %
Eosinophils Absolute: 0 10*3/uL (ref 0.0–0.5)
Eosinophils Relative: 0 %
HCT: 25.1 % — ABNORMAL LOW (ref 36.0–46.0)
Hemoglobin: 8.2 g/dL — ABNORMAL LOW (ref 12.0–15.0)
Immature Granulocytes: 1 %
Lymphocytes Relative: 5 %
Lymphs Abs: 0.8 10*3/uL (ref 0.7–4.0)
MCH: 28.6 pg (ref 26.0–34.0)
MCHC: 32.7 g/dL (ref 30.0–36.0)
MCV: 87.5 fL (ref 80.0–100.0)
Monocytes Absolute: 0.9 10*3/uL (ref 0.1–1.0)
Monocytes Relative: 6 %
Neutro Abs: 13.3 10*3/uL — ABNORMAL HIGH (ref 1.7–7.7)
Neutrophils Relative %: 88 %
Platelets: 504 10*3/uL — ABNORMAL HIGH (ref 150–400)
RBC: 2.87 MIL/uL — ABNORMAL LOW (ref 3.87–5.11)
RDW: 14.1 % (ref 11.5–15.5)
WBC: 15.2 10*3/uL — ABNORMAL HIGH (ref 4.0–10.5)
nRBC: 0 % (ref 0.0–0.2)

## 2019-10-29 LAB — MAGNESIUM: Magnesium: 1.9 mg/dL (ref 1.7–2.4)

## 2019-10-29 LAB — PHOSPHORUS: Phosphorus: 4.6 mg/dL (ref 2.5–4.6)

## 2019-10-29 NOTE — Progress Notes (Signed)
This RN called into pt room by family, stating pt was having a hard time breathing. Pt was resting in bed, O2 99% on 2L, in no obvious distress. Pt complaints of labored breathing, RR 18. Pt pulled up in bed and repositioned. Audible wheezing, respiratory called to administer PRN breathing tx. Family requesting scheduled breathing tx be added in addition to PRN. MD made aware. Will continue to monitor patient.

## 2019-10-29 NOTE — Progress Notes (Signed)
This RN asked pt if she would like pleurx chest tube drained at this time. Family informed this nurse that pt has been off of O2 since earlier this am, per pt request. Pt noted to be sating 93-94% on room air. Pt given this information, and states she is not SOB and does not wish for chest tube to be drained at this time. Will continue to monitor patient.

## 2019-10-29 NOTE — Progress Notes (Signed)
Daily Progress Note   Patient Name: Autumn Cook       Date: 10/29/2019 DOB: [birth year]  Age: 59 y.o. MRN#: [id number] Attending Physician: Alma Friendly, MD Primary Care Physician: Darreld Mclean, MD Admit Date: 10/16/2019  Reason for Consultation/Follow-up: Non pain symptom management and Pain control  Subjective:  Ms. Siguenza is resting in bed, daughter at bedside. We discussed about patient's pain and non pain symptom management. She is having a better day today, resting well, pain and nausea better controlled today, PO intake is better too. Will add magic cup.     Length of Stay: 13  Current Medications: Scheduled Meds:  . acetaminophen  500 mg Oral TID WC  . atenolol  100 mg Oral Daily  . capecitabine  1,500 mg Oral BID PC  . pulmozyme (DORNASE) for intrapleural administration  5 mg Intrapleural Once  . enoxaparin (LOVENOX) injection  40 mg Subcutaneous Q24H  . feeding supplement (ENSURE ENLIVE)  237 mL Oral TID BM  . fentaNYL  1 patch Transdermal Q72H  . fentaNYL  1 patch Transdermal Q72H  . hydrocortisone cream   Topical BID  . insulin aspart  0-15 Units Subcutaneous Q4H  . mouth rinse  15 mL Mouth Rinse BID  . polyethylene glycol  17 g Oral BID  . promethazine  12.5 mg Oral Q6H  . senna  2 tablet Oral BID  . sucralfate  1 g Oral TID WC & HS  . venlafaxine XR  37.5 mg Oral 1 day or 1 dose   PRN Meds: ipratropium-albuterol, LORazepam, metoprolol tartrate, morphine injection, prochlorperazine **OR** prochlorperazine, sorbitol, traMADol  Physical Exam     General: Awake,  in no acute distress.  Heart: Regular rate and rhythm.  Lungs: Decreased Abdomen: Soft, non distended.  Ext: No significant edema Skin: Warm and dry      Vital Signs: BP 135/84 (BP  Location: Left Arm)   Pulse 95   Temp 98.6 F (37 C) (Oral)   Resp 16   Ht 5\' 6"  (1.676 m)   Wt 81.3 kg   LMP 11/14/2013   SpO2 94%   BMI 28.94 kg/m  SpO2: SpO2: 94 % O2 Device: O2 Device: Room Air O2 Flow Rate: O2 Flow Rate (L/min): 2 L/min  Intake/output summary:   Intake/Output Summary (Last 24  hours) at 10/29/2019 1359 Last data filed at 10/29/2019 0900 Gross per 24 hour  Intake 180 ml  Output --  Net 180 ml   LBM: Last BM Date: 10/25/19 Baseline Weight: Weight: 80.3 kg Most recent weight: Weight: 81.3 kg       Palliative Assessment/Data: Flowsheet Rows     Most Recent Value  Intake Tab  Referral Department Hospitalist  Unit at Time of Referral Other (Comment)  [urology/telementry]  Palliative Care Primary Diagnosis Cancer  Date Notified 10/20/19  Palliative Care Type New Palliative care  Reason for referral Pain, Non-pain Symptom  Date of Admission 10/16/19  Date first seen by Palliative Care 10/20/19  # of days Palliative referral response time 0 Day(s)  # of days IP prior to Palliative referral 4  Clinical Assessment  Psychosocial & Spiritual Assessment  Palliative Care Outcomes     Patient Active Problem List   Diagnosis Date Noted  . Cancer associated pain   . Therapeutic opioid induced constipation   . Generalized pain   . General weakness   . Palliative care by specialist   . Acute respiratory failure ([place]) 10/16/2019  . Diabetes mellitus type 2, uncontrolled, with complications ([place]) 35/46/5681  . Acute respiratory failure with hypoxia ([place]) 10/16/2019  . Pneumonia of right lower lobe due to infectious organism 10/16/2019  . Metastases to the liver ([place]) 10/10/2019  . 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 Pleural effusion on left 10/09/2019  . Type 2 diabetes mellitus with hyperglycemia ([place]) 10/09/2019  . Encounter for central line placement 08/13/2018  . Port-A-Cath in place 08/07/2018  . Family history of lung cancer   . Malignant  neoplasm of overlapping sites of right breast in female, estrogen receptor positive ([place]) 07/23/2018  . Achilles tendonitis, bilateral 06/11/2015  . Chest pain with moderate risk for cardiac etiology 10/13/2014  . Hypercholesterolemia 10/13/2014  . Idiopathic scoliosis 08/18/2014  . Hypertension 09/11/2012  . Overweight (BMI 25.0-29.9) 09/11/2012  . Migraine headache 09/11/2012  . Hx of thyroid nodule 09/11/2012  . History of tobacco use 09/11/2012    Palliative Care Assessment & Plan   Patient Profile: 59 year old female with metastatic breast cancer under the care of [name].  She is undergoing chemotherapy and radiation and is status post Pleurx catheter placement with cytology revealing malignant cells.  Currently admitted for discomfort and shortness of breath and pulmonary working to increase drainage from Pleurx catheter.  Plan on discharge she is for follow-up with [name] for further treatment with capecitabine.  Palliative following for pain management.  Recommendations/Plan: -Pain:  Continue fentanyl patch 37.5 mcg/h. Discussed with daughter at bedside about current pain and non pain symptom management. Medication history noted. Add magic cup.   -Constipation: Opioid related.  Continue Miralax and senna.    Goals of Care and Additional Recommendations:  Limitations on Scope of Treatment: Full Scope Treatment  Code Status:    Code Status Orders  (From admission, onward)         Start     Ordered   10/16/19 0803  Full code  Continuous        10/16/19 0808        Code Status History    Date Active Date Inactive Code Status Order ID Comments User Context   10/09/2019 2245 10/14/2019 2035 Full Code [id number]  Rise Patience, MD Inpatient   Advance Care Planning Activity       Prognosis:   Unable to determine  Discharge Planning:  Home  with Home Health vs SNF for rehab  Care plan was discussed with patient's daughter.   Thank you for allowing  the Palliative Medicine Team to assist in the care of this patient.    Total Time 25 Prolonged Time Billed No   Greater than 50%  of this time was spent counseling and coordinating care related to the above assessment and plan.  Loistine Chance, MD  Please contact Palliative Medicine Team phone at [phone] for questions and concerns.

## 2019-10-29 NOTE — Progress Notes (Signed)
PROGRESS NOTE    Autumn Cook  WER:[id number] DOB: 02/05/1961 DOA: 10/16/2019 PCP: Darreld Mclean, MD   Chief Complaint  Patient presents with  . Shortness of Breath  . Chest Pain    Brief Narrative:  Patient is a 59 year old female with history of right breast cancer, hypertension, hyperlipidemia, type 2 diabetes mellitus, asthma who presents to the emergency department with complaints of central sharp chest pain as well as shortness of breath. She was recently admitted here and discharged 2 days prior to presentation when she underwent Pleurx catheter placement by PCCM after she was found to have large left pleural effusion secondary to her breast cancer. Patient did not know how to use the Pleurx catheter at home. On presentation, chest x-ray showed complete white out of the left lung but there was also suspicion for new consolidations on the right side concerning for pneumonia for which she was started on antibiotics.  Patient admitted for further management.   Assessment & Plan:   Active Problems:   Hypertension   History of tobacco use   Hypercholesterolemia   Malignant neoplasm of overlapping sites of right breast in female, estrogen receptor positive ([place])   Encounter for central line placement   Malignant pleural effusion   Pleural effusion on left   Metastases to the liver [facility])   Acute respiratory failure (HCC)   Diabetes mellitus type 2, uncontrolled, with complications (HCC)   Acute respiratory failure with hypoxia (HCC)   Pneumonia of right lower lobe due to infectious organism   Generalized pain   General weakness   Palliative care by specialist   Cancer associated pain   Therapeutic opioid induced constipation  Acute on chronic respiratory failure with hypoxia likely 2/2 left-sided extensivemalignantpleural effusion On 2 L of oxygen at home at baseline Chest tube clamped per PCCM, resume 2x weekly drainage - has outpatient pulm f/u 7/8 at 0930 with  Geraldo Pitter  - they've reviewed drainage with pt Pulm suspects element of trapped lung, won't be candidate for pleurodesis due to this On 6/22 only 50 cc out - repeat CXR 6/23 showed unchanged large left pleural effusion with collapse of the majority of the left lung, plan to discuss with pulmonology Oncology following, appreciate recommendations Palliative care following for pain control, appreciate assistance  Malignantneoplasm of the right breast [name] is following. Capecitabine started on 10/28/2019  Hypertension Blood pressure stable at this time Continue atenolol, monitor closely  Diabetes mellitus A1c of 7.8. Continue sliding scaleinsulin for now.She is not on any medications at home. Consider Metformin 500 mg twice a day on discharge  Goals of care Poor prognosis Palliative care consulted for pain management, goals of care   DVT prophylaxis: (lovenox Code Status: full  Family Communication: Discussed extensively with daughters at bedside Disposition:   Status is: Inpatient  Remains inpatient appropriate because:Inpatient level of care appropriate due to severity of illness   Dispo: The patient is from: Home              Anticipated d/c is to: Home              Anticipated d/c date is: 2 days              Patient currently is not medically stable to d/c.   Consultants:   PCCM  Pulm  Oncology  Procedures:  none  Antimicrobials: Anti-infectives (From admission, onward)   Start     Dose/Rate Route Frequency Ordered Stop   10/16/19  1400  ceFEPIme (MAXIPIME) 2 g in sodium chloride 0.9 % 100 mL IVPB  Status:  Discontinued        2 g 200 mL/hr over 30 Minutes Intravenous Every 8 hours 10/16/19 0817 10/17/19 1426   10/16/19 0530  vancomycin (VANCOCIN) IVPB 1000 mg/200 mL premix        1,000 mg 200 mL/hr over 60 Minutes Intravenous  Once 10/16/19 0520 10/16/19 0741   10/16/19 0530  ceFEPIme (MAXIPIME) 2 g in sodium chloride 0.9 % 100 mL IVPB         2 g 200 mL/hr over 30 Minutes Intravenous  Once 10/16/19 0520 10/16/19 0741      Subjective: Reports having a better day today, still nauseated with generalized pain.  Still with poor appetite.  Denies any chest pain, worsening shortness of breath, abdominal pain, no vomiting noted.   Objective: Vitals:   10/28/19 1622 10/28/19 2142 10/29/19 0046 10/29/19 1155  BP: (!) 147/78 125/83  135/84  Pulse: 87 92  95  Resp: 19 18  16   Temp: 98.4 F (36.9 C) 98.2 F (36.8 C)  98.6 F (37 C)  TempSrc: Oral Oral  Oral  SpO2: 97% 100% 98% 94%  Weight:      Height:        Intake/Output Summary (Last 24 hours) at 10/29/2019 1854 Last data filed at 10/29/2019 1817 Gross per 24 hour  Intake 290 ml  Output --  Net 290 ml   Filed Weights   10/16/19 0431 10/27/19 1854 10/28/19 0406  Weight: 80.3 kg 83.1 kg 81.3 kg    Examination:  General: NAD   Cardiovascular: S1, S2 present  Respiratory:  Diminished breath sounds on the left, with noted Pleurx  Abdomen: Soft, nontender, nondistended, bowel sounds present  Musculoskeletal: No bilateral pedal edema noted  Skin: Normal  Psychiatry:  Fair mood    Data Reviewed: I have personally reviewed following labs and imaging studies  CBC: Recent Labs  Lab 10/25/19 0606 10/26/19 0800 10/27/19 0500 10/28/19 0517 10/29/19 0644  WBC 8.4 9.5 9.5 12.9* 15.2*  NEUTROABS 6.5 7.8* 8.1* 11.1* 13.3*  HGB 8.3* 8.7* 8.2* 8.3* 8.2*  HCT 25.6* 26.7* 25.0* 25.0* 25.1*  MCV 88.6 90.2 88.3 86.8 87.5  PLT 402* 433* 449* 492* 504*    Basic Metabolic Panel: Recent Labs  Lab 10/24/19 0524 10/24/19 0524 10/25/19 0606 10/25/19 0616 10/26/19 0542 10/27/19 0500 10/28/19 0517 10/29/19 0644  NA 139   < >  --  137 137 136 138 138  K 4.6   < >  --  4.4 4.2 4.3 4.4 4.5  CL 105   < >  --  101 104 100 102 99  CO2 23   < >  --  25 25 22 22 22   GLUCOSE 140*   < >  --  119* 151* 154* 139* 135*  BUN 20   < >  --  20 18 20  24* 35*  CREATININE 0.73    < >  --  0.72 0.70 0.69 0.79 1.02*  CALCIUM 9.1   < >  --  8.9 9.1 9.3 9.5 9.4  MG 1.9   < > 1.7  --  1.6* 1.7 1.7 1.9  PHOS 3.6  --   --   --  3.7 4.8* 4.8* 4.6   < > = values in this interval not displayed.    GFR: Estimated Creatinine Clearance: 65.4 mL/min (A) (by C-G formula based on SCr of 1.02 mg/dL (  H)).  Liver Function Tests: Recent Labs  Lab 10/24/19 0524 10/26/19 0542 10/27/19 0500 10/28/19 0517 10/29/19 0644  AST 53* 50* 43* 41 46*  ALT 27 28 25 22 21   ALKPHOS 74 75 78 76 76  BILITOT 0.6 0.3 0.3 0.8 0.6  PROT 6.0* 6.1* 6.3* 6.3* 6.5  ALBUMIN 2.5* 2.4* 2.5* 2.4* 2.3*    CBG: Recent Labs  Lab 10/28/19 2359 10/29/19 0425 10/29/19 0734 10/29/19 1152 10/29/19 1625  GLUCAP 131* 125* 126* 134* 114*     No results found for this or any previous visit (from the past 240 hour(s)).       Radiology Studies: DG CHEST PORT 1 VIEW  Result Date: 10/29/2019 CLINICAL DATA:  Pleural effusion. EXAM: PORTABLE CHEST 1 VIEW COMPARISON:  Chest x-ray dated October 23, 2019. FINDINGS: Left-sided PleurX catheter again noted. Large left pleural effusion again noted. Small amount of aerated left upper lobe at the apex. The remaining left lung is collapsed. The right lung is clear. No pneumothorax. Mediastinum remains deviated to the right. No acute osseous abnormality. IMPRESSION: 1. Unchanged large left pleural effusion with collapse of the majority of the left lung. Electronically Signed   By: Titus Dubin M.D.   On: 10/29/2019 09:14        Scheduled Meds: . acetaminophen  500 mg Oral TID WC  . atenolol  100 mg Oral Daily  . capecitabine  1,500 mg Oral BID PC  . pulmozyme (DORNASE) for intrapleural administration  5 mg Intrapleural Once  . enoxaparin (LOVENOX) injection  40 mg Subcutaneous Q24H  . feeding supplement (ENSURE ENLIVE)  237 mL Oral TID BM  . fentaNYL  1 patch Transdermal Q72H  . fentaNYL  1 patch Transdermal Q72H  . hydrocortisone cream   Topical BID  .  insulin aspart  0-15 Units Subcutaneous Q4H  . mouth rinse  15 mL Mouth Rinse BID  . polyethylene glycol  17 g Oral BID  . promethazine  12.5 mg Oral Q6H  . senna  2 tablet Oral BID  . sucralfate  1 g Oral TID WC & HS  . venlafaxine XR  37.5 mg Oral 1 day or 1 dose   Continuous Infusions:   LOS: 13 days     Alma Friendly, MD [facility]  10/29/2019, 6:54 PM

## 2019-10-29 NOTE — Progress Notes (Signed)
This nurse spoke with PT earlier in the shift to coordinate their visit with pain medication administration, around 1545, per pt and family request. At that time, pt still drowsy from prior morphine administration, and pt and family requested to have PT moved to tomorrow am. This RN made PT aware. PT states they will make note for shift tomorrow to see as their schedule allows. All of above communicated to pt and family. Will continue to monitor patient.

## 2019-10-29 NOTE — Progress Notes (Signed)
PT Cancellation Note  Patient Details Name: Autumn Cook MRN: [id number] DOB: June 12, 1960   Cancelled Treatment:    Reason Eval/Treat Not Completed: Other (comment). Spoke with RN earlier today, per pt and family request  PT made appointment to see pt at 3:45 after she had her tramadol. Pt also had morphine earlier this pm. Went to see pt at 3:50 and spoke with RN who stated that pt was still feeling woozy d/t the morphine. Pt and family request PT re-attempt next day.  Will see as schedule allows.   Baxter Flattery, PT  Acute Rehab Dept [facility]) [phone] Pager [phone]  10/29/2019'    Kenyon Ana 10/29/2019, 4:08 PM

## 2019-10-29 NOTE — Progress Notes (Signed)
Came to see pt at shift change, pt in bed left side laying.  Both daughters at bed side.  Asked pt about her pain, she said its a 3-4, RN asked if she was ok with the level of pain and she said yes.  RN asked what flavor boost she would like this evening and she said no "boost tonight"

## 2019-10-29 NOTE — TOC Progression Note (Signed)
Transition of Care [facility]) - Progression Note    Patient Details  Name: Autumn Cook MRN: [id number] Date of Birth: 03-May-1961  Transition of Care [facility]) CM/SW Contact  Kahdijah Errickson, Juliann Pulse, RN Phone Number: 10/29/2019, 12:29 PM  Clinical Narrative: Damaris Schooner to dtr [name] in rm with patient-decline SNF-about [place] services/dme-chose [place] following for [facility] nursing-pleur-x drain care,HH aide. [facility] does not have home therapy services-CM will check w/[employer] Chief Medical Examiner Dr. Verner Chol if they have Home PT in network agency for deconditioning.[employer] rep Thedore Mins aware of dme needed. Family will transport home on own. Continue to monitor d/c needs.      Expected Discharge Plan: [place] Barriers to Discharge: No [place] will accept this patient  Expected Discharge Plan and Services Expected Discharge Plan: [place]   Discharge Planning Services: CM Consult Post Acute Care Choice: Durable Medical Equipment, Home Health Living arrangements for the past 2 months: Single Family Home Expected Discharge Date:  (unknown)               DME Arranged: Oxygen, Walker rolling, Bedside commode, Lightweight manual wheelchair with seat cushion, Hospital bed         HH Arranged: RN, Nurse's Aide HH Agency: [facility] Date HH Agency Contacted: 10/29/19 Time [place]: 1229 Representative spoke with at [place]: [facility]) Interventions    Readmission Risk Interventions No flowsheet data found.

## 2019-10-30 ENCOUNTER — Inpatient Hospital Stay (HOSPITAL_COMMUNITY): Payer: 59

## 2019-10-30 LAB — CBC WITH DIFFERENTIAL/PLATELET
Abs Immature Granulocytes: 0.23 10*3/uL — ABNORMAL HIGH (ref 0.00–0.07)
Basophils Absolute: 0 10*3/uL (ref 0.0–0.1)
Basophils Relative: 0 %
Eosinophils Absolute: 0.1 10*3/uL (ref 0.0–0.5)
Eosinophils Relative: 0 %
HCT: 25.7 % — ABNORMAL LOW (ref 36.0–46.0)
Hemoglobin: 8.2 g/dL — ABNORMAL LOW (ref 12.0–15.0)
Immature Granulocytes: 2 %
Lymphocytes Relative: 5 %
Lymphs Abs: 0.8 10*3/uL (ref 0.7–4.0)
MCH: 28 pg (ref 26.0–34.0)
MCHC: 31.9 g/dL (ref 30.0–36.0)
MCV: 87.7 fL (ref 80.0–100.0)
Monocytes Absolute: 0.9 10*3/uL (ref 0.1–1.0)
Monocytes Relative: 6 %
Neutro Abs: 13.9 10*3/uL — ABNORMAL HIGH (ref 1.7–7.7)
Neutrophils Relative %: 87 %
Platelets: 594 10*3/uL — ABNORMAL HIGH (ref 150–400)
RBC: 2.93 MIL/uL — ABNORMAL LOW (ref 3.87–5.11)
RDW: 14.4 % (ref 11.5–15.5)
WBC: 15.9 10*3/uL — ABNORMAL HIGH (ref 4.0–10.5)
nRBC: 0.1 % (ref 0.0–0.2)

## 2019-10-30 LAB — COMPREHENSIVE METABOLIC PANEL
ALT: 21 U/L (ref 0–44)
AST: 62 U/L — ABNORMAL HIGH (ref 15–41)
Albumin: 2.3 g/dL — ABNORMAL LOW (ref 3.5–5.0)
Alkaline Phosphatase: 85 U/L (ref 38–126)
Anion gap: 16 — ABNORMAL HIGH (ref 5–15)
BUN: 39 mg/dL — ABNORMAL HIGH (ref 6–20)
CO2: 21 mmol/L — ABNORMAL LOW (ref 22–32)
Calcium: 9.4 mg/dL (ref 8.9–10.3)
Chloride: 100 mmol/L (ref 98–111)
Creatinine, Ser: 1 mg/dL (ref 0.44–1.00)
GFR calc Af Amer: >60 mL/min (ref >60)
GFR calc non Af Amer: >60 mL/min (ref >60)
Glucose, Bld: 127 mg/dL — ABNORMAL HIGH (ref 70–99)
Potassium: 4.8 mmol/L (ref 3.5–5.1)
Sodium: 137 mmol/L (ref 135–145)
Total Bilirubin: 1 mg/dL (ref 0.3–1.2)
Total Protein: 6.8 g/dL (ref 6.5–8.1)

## 2019-10-30 LAB — GLUCOSE, CAPILLARY
Glucose-Capillary: 117 mg/dL — ABNORMAL HIGH (ref 70–99)
Glucose-Capillary: 135 mg/dL — ABNORMAL HIGH (ref 70–99)
Glucose-Capillary: 139 mg/dL — ABNORMAL HIGH (ref 70–99)
Glucose-Capillary: 145 mg/dL — ABNORMAL HIGH (ref 70–99)
Glucose-Capillary: 155 mg/dL — ABNORMAL HIGH (ref 70–99)
Glucose-Capillary: 163 mg/dL — ABNORMAL HIGH (ref 70–99)

## 2019-10-30 MED ORDER — VENLAFAXINE HCL ER 75 MG PO CP24
75.0000 mg | ORAL_CAPSULE | ORAL | Status: DC
  Administered 2019-10-30: 75 mg via ORAL
  Filled 2019-10-30: qty 1

## 2019-10-30 NOTE — Progress Notes (Signed)
Pt up to toilet this morning, BM this morning,  In a great deal of pain with ambulation and very weak.  Pt requested IV pain meds, when attempted to flush site was very painful. Forced to pull IV put in STAT IV team order to reestablish IV access.  Offered pt PO tramadol in mean time she declined

## 2019-10-30 NOTE — Progress Notes (Signed)
Called by Ms Wegener' daughters for an update. They tell me their mother cannot lie flat so cannot have a brain CT or MRI. I reassured them my suspicion of brain mets is low, I just wanted to rule it out given her persistent dizzyness. The brain studies can be postponed until the patient recovers sufficiently to lie on her back for 20 minutes and can be done as outpatient if necessary

## 2019-10-30 NOTE — Plan of Care (Signed)

## 2019-10-30 NOTE — Progress Notes (Signed)
PROGRESS NOTE    Autumn Cook  ION:[id number] DOB: 1960-08-10 DOA: 10/16/2019 PCP: Darreld Mclean, MD   Chief Complaint  Patient presents with  . Shortness of Breath  . Chest Pain    Brief Narrative:  Patient is a 59 year old female with history of right breast cancer, hypertension, hyperlipidemia, type 2 diabetes mellitus, asthma who presents to the emergency department with complaints of central sharp chest pain as well as shortness of breath. She was recently admitted here and discharged 2 days prior to presentation when she underwent Pleurx catheter placement by PCCM after she was found to have large left pleural effusion secondary to her breast cancer. Patient did not know how to use the Pleurx catheter at home. On presentation, chest x-ray showed complete white out of the left lung but there was also suspicion for new consolidations on the right side concerning for pneumonia for which she was started on antibiotics.  Patient admitted for further management.   Assessment & Plan:   Active Problems:   Hypertension   History of tobacco use   Hypercholesterolemia   Malignant neoplasm of overlapping sites of right breast in female, estrogen receptor positive ([place])   Encounter for central line placement   Malignant pleural effusion   Pleural effusion on left   Metastases to the liver [facility])   Acute respiratory failure (HCC)   Diabetes mellitus type 2, uncontrolled, with complications (HCC)   Acute respiratory failure with hypoxia (HCC)   Pneumonia of right lower lobe due to infectious organism   Generalized pain   General weakness   Palliative care by specialist   Cancer associated pain   Therapeutic opioid induced constipation  Acute on chronic respiratory failure with hypoxia likely 2/2 left-sided extensivemalignantpleural effusion On 2 L of oxygen at home at baseline Chest tube clamped per PCCM, resume 2x weekly drainage - has outpatient pulm f/u 7/8 at 0930 with  Geraldo Pitter  - they've reviewed drainage with pt Pulm suspects element of trapped lung, won't be candidate for pleurodesis due to this On 6/22 only 50 cc out - repeat CXR 6/23 showed unchanged large left pleural effusion with collapse of the majority of the left lung Spoke to [place] RN from PCCM on 10/30/19 about pleurx drainage and care, saw pt, pt refusing to have pleurx drained  Oncology following, appreciate recommendations Palliative care following for pain control, appreciate assistance  Malignantneoplasm of the right breast [name] is following. Capecitabine started on 10/28/2019  Leukocytosis WBC rising Previous blood cultures no growth till date, will repeat Chest x-ray as mentioned above, no pneumonia noted UA/UC pending Procalcitonin pending Daily CBC  Hypertension Blood pressure stable at this time Continue atenolol, monitor closely  Diabetes mellitus A1c of 7.8. Continue sliding scaleinsulin for now.She is not on any medications at home. Consider Metformin 500 mg twice a day on discharge  Goals of care Poor prognosis Palliative care consulted for pain management, goals of care   DVT prophylaxis: (lovenox Code Status: full  Family Communication: Discussed extensively with daughters at bedside Disposition:   Status is: Inpatient  Remains inpatient appropriate because:Inpatient level of care appropriate due to severity of illness   Dispo: The patient is from: Home              Anticipated d/c is to: SNF              Anticipated d/c date is: 2 days  Patient currently is not medically stable to d/c.   Consultants:   PCCM  Pulm  Oncology  Procedures:  none  Antimicrobials: Anti-infectives (From admission, onward)   Start     Dose/Rate Route Frequency Ordered Stop   10/16/19 1400  ceFEPIme (MAXIPIME) 2 g in sodium chloride 0.9 % 100 mL IVPB  Status:  Discontinued        2 g 200 mL/hr over 30 Minutes Intravenous Every 8 hours  10/16/19 0817 10/17/19 1426   10/16/19 0530  vancomycin (VANCOCIN) IVPB 1000 mg/200 mL premix        1,000 mg 200 mL/hr over 60 Minutes Intravenous  Once 10/16/19 0520 10/16/19 0741   10/16/19 0530  ceFEPIme (MAXIPIME) 2 g in sodium chloride 0.9 % 100 mL IVPB        2 g 200 mL/hr over 30 Minutes Intravenous  Once 10/16/19 0520 10/16/19 0741      Subjective: Patient noted to be more fatigued, sleepy, very deconditioned.  Still complains of generalized pain.   Objective: Vitals:   10/30/19 0434 10/30/19 1051 10/30/19 1201 10/30/19 1331  BP: 129/78 114/75 118/82   Pulse: (!) 103 (!) 103 92   Resp: 18  15   Temp: 98.4 F (36.9 C)  97.9 F (36.6 C)   TempSrc: Oral  Oral   SpO2: 91% 100% 99% 98%  Weight:      Height:        Intake/Output Summary (Last 24 hours) at 10/30/2019 1655 Last data filed at 10/30/2019 4193 Gross per 24 hour  Intake 230 ml  Output --  Net 230 ml   Filed Weights   10/16/19 0431 10/27/19 1854 10/28/19 0406  Weight: 80.3 kg 83.1 kg 81.3 kg    Examination:  General: NAD, lethargic, very deconditioned  Cardiovascular: S1, S2 present  Respiratory:  Diminished breath sounds on the left, with noted Pleurx  Abdomen: Soft, nontender, nondistended, bowel sounds present  Musculoskeletal: No bilateral pedal edema noted  Skin: Normal  Psychiatry:  Fair mood    Data Reviewed: I have personally reviewed following labs and imaging studies  CBC: Recent Labs  Lab 10/26/19 0800 10/27/19 0500 10/28/19 0517 10/29/19 0644 10/30/19 0531  WBC 9.5 9.5 12.9* 15.2* 15.9*  NEUTROABS 7.8* 8.1* 11.1* 13.3* 13.9*  HGB 8.7* 8.2* 8.3* 8.2* 8.2*  HCT 26.7* 25.0* 25.0* 25.1* 25.7*  MCV 90.2 88.3 86.8 87.5 87.7  PLT 433* 449* 492* 504* 594*    Basic Metabolic Panel: Recent Labs  Lab 10/24/19 0524 10/24/19 0524 10/25/19 0606 10/25/19 0616 10/26/19 0542 10/27/19 0500 10/28/19 0517 10/29/19 0644 10/30/19 0531  NA 139  --   --    < > 137 136 138 138 137   K 4.6  --   --    < > 4.2 4.3 4.4 4.5 4.8  CL 105  --   --    < > 104 100 102 99 100  CO2 23  --   --    < > 25 22 22 22  21*  GLUCOSE 140*  --   --    < > 151* 154* 139* 135* 127*  BUN 20  --   --    < > 18 20 24* 35* 39*  CREATININE 0.73  --   --    < > 0.70 0.69 0.79 1.02* 1.00  CALCIUM 9.1  --   --    < > 9.1 9.3 9.5 9.4 9.4  MG 1.9   < >  1.7  --  1.6* 1.7 1.7 1.9  --   PHOS 3.6  --   --   --  3.7 4.8* 4.8* 4.6  --    < > = values in this interval not displayed.    GFR: Estimated Creatinine Clearance: 66.7 mL/min (by C-G formula based on SCr of 1 mg/dL).  Liver Function Tests: Recent Labs  Lab 10/26/19 0542 10/27/19 0500 10/28/19 0517 10/29/19 0644 10/30/19 0531  AST 50* 43* 41 46* 62*  ALT 28 25 22 21 21   ALKPHOS 75 78 76 76 85  BILITOT 0.3 0.3 0.8 0.6 1.0  PROT 6.1* 6.3* 6.3* 6.5 6.8  ALBUMIN 2.4* 2.5* 2.4* 2.3* 2.3*    CBG: Recent Labs  Lab 10/29/19 2348 10/30/19 0407 10/30/19 0733 10/30/19 1157 10/30/19 1635  GLUCAP 130* 117* 155* 145* 163*     No results found for this or any previous visit (from the past 240 hour(s)).       Radiology Studies: DG CHEST PORT 1 VIEW  Result Date: 10/29/2019 CLINICAL DATA:  Pleural effusion. EXAM: PORTABLE CHEST 1 VIEW COMPARISON:  Chest x-ray dated October 23, 2019. FINDINGS: Left-sided PleurX catheter again noted. Large left pleural effusion again noted. Small amount of aerated left upper lobe at the apex. The remaining left lung is collapsed. The right lung is clear. No pneumothorax. Mediastinum remains deviated to the right. No acute osseous abnormality. IMPRESSION: 1. Unchanged large left pleural effusion with collapse of the majority of the left lung. Electronically Signed   By: Titus Dubin M.D.   On: 10/29/2019 09:14        Scheduled Meds: . acetaminophen  500 mg Oral TID WC  . atenolol  100 mg Oral Daily  . capecitabine  1,500 mg Oral BID PC  . pulmozyme (DORNASE) for intrapleural administration  5 mg  Intrapleural Once  . enoxaparin (LOVENOX) injection  40 mg Subcutaneous Q24H  . feeding supplement (ENSURE ENLIVE)  237 mL Oral TID BM  . fentaNYL  1 patch Transdermal Q72H  . fentaNYL  1 patch Transdermal Q72H  . hydrocortisone cream   Topical BID  . insulin aspart  0-15 Units Subcutaneous Q4H  . mouth rinse  15 mL Mouth Rinse BID  . polyethylene glycol  17 g Oral BID  . promethazine  12.5 mg Oral Q6H  . senna  2 tablet Oral BID  . sucralfate  1 g Oral TID WC & HS  . venlafaxine XR  75 mg Oral 1 day or 1 dose   Continuous Infusions:   LOS: 14 days     Alma Friendly, MD [facility]  10/30/2019, 4:55 PM

## 2019-10-30 NOTE — Progress Notes (Signed)
Spoke with RN, MRI on hold at this moment.

## 2019-10-30 NOTE — Progress Notes (Signed)
Patient's daughters requesting to speak with [name].  Dr. Virgie Dad nurse called and notified.  Contact numbers for both daughters given to Dr. Virgie Dad nurse, Val.  Also notified that daughter will be in room with patient until approximately 8 p.m. tonight, if able to come and speak with them.

## 2019-10-30 NOTE — Progress Notes (Signed)
Patient denies urge to void at this time.  Night RN notified.  Dr. Sharlet Salina notified via text page patient has not voided and bladder scan shows 0 ml.

## 2019-10-30 NOTE — Progress Notes (Signed)
Patient has urinalysis and culture ordered.  RN entered room.  Patient's daughter at bedside to assist patient in getting up to bathroom.  Patient reports that she cannot get up at this time - states she had the urge to void but will wait until later.  Patient has not voided.  Bladder scan performed twice = 0 ml.  Will continue to monitor.

## 2019-10-30 NOTE — Progress Notes (Signed)
PT Cancellation Note  Patient Details Name: Autumn Cook MRN: [id number] DOB: Dec 21, 1960   Cancelled Treatment:    Reason Eval/Treat Not Completed: Pain limiting ability to participate (pt reports she has severe pain with movement and is sleepy from morphine. She requested PT attempt later today when morphine has worn off & she's had next dose of tramadol. Will follow.)  [name] PT 10/30/2019  Acute Rehabilitation Services Pager [phone] Office [phone]

## 2019-10-30 NOTE — Progress Notes (Signed)
Patient resting in bed with daughter at bedside.  Patient reports pain at 5/10.  Patient reports her breathing to be "a little labored."  O2 sats on 2L [place] 100%.  RT notified for breathing treatment.  Daughter asked about pulmonology seeing patient today.  Dr. Horris Latino notified via text page.

## 2019-10-30 NOTE — TOC Progression Note (Signed)
Transition of Care [facility]) - Progression Note    Patient Details  Name: Autumn Cook MRN: [id number] Date of Birth: 09/06/1960  Transition of Care [facility]) CM/SW Contact  Latesia Norrington, Juliann Pulse, RN Phone Number: 10/30/2019, 3:37 PM  Clinical Narrative: Received call from [place] duty care for Nursing-they will start [place] for [place] following.      Expected Discharge Plan: [place] Barriers to Discharge: No [place] will accept this patient  Expected Discharge Plan and Services Expected Discharge Plan: [place]   Discharge Planning Services: CM Consult Post Acute Care Choice: Durable Medical Equipment, Home Health Living arrangements for the past 2 months: Single Family Home Expected Discharge Date:  (unknown)               DME Arranged: Oxygen, Walker rolling, Bedside commode, Lightweight manual wheelchair with seat cushion, Hospital bed         HH Arranged: RN, Nurse's Aide HH Agency: [place] Date HH Agency Contacted: 10/29/19 Time [place]: 1229 Representative spoke with at [place]: [place] ([place]) Interventions    Readmission Risk Interventions No flowsheet data found.

## 2019-10-30 NOTE — Progress Notes (Signed)
Physical Therapy Treatment Patient Details Name: Autumn Cook MRN: [id number] DOB: February 22, 1961 Today's Date: 10/30/2019    History of Present Illness 59 yo female former smoker with metastatic breast cancer complicated by malignant pleural effusion.  She had pleurx catheter placed on 10/14/19    PT Comments    Pt is lethargic but does arouse to verbal stimulii. Pt was lying on her L side in bed, stated she cannot tolerate lying on her back 2* pain. Mod assist for sidelying to sit, pt reported significant dizziness in sitting with 2 trials of sidelying to sit. She was only able to tolerate sitting for ~45 seconds. I attempted to obtain sitting BP, however pt unable to tolerate sitting position long enough to obtain reading. This is a decline in activity tolerance. Obtaining orthostatic vitals is recommended, RN notified. PT now recommending SNF.     Follow Up Recommendations  Supervision for mobility/OOB;SNF;Supervision/Assistance - 24 hour     Equipment Recommendations  Rolling walker with 5" wheels;3in1 (PT);Wheelchair (measurements PT);Wheelchair cushion (measurements PT)    Recommendations for Other Services       Precautions / Restrictions Precautions Precautions: Fall Precaution Comments: monitor O2, pleurx catheter; dizzy in sitting Restrictions Weight Bearing Restrictions: No    Mobility  Bed Mobility Overal bed mobility: Needs Assistance Bed Mobility: Rolling;Sidelying to Sit Rolling: Min assist Sidelying to sit: Mod assist;HOB elevated       General bed mobility comments: VCs for log roll technique (pt in partial sidelying position at start of tx, stated she can't tolerate lying on her back), mod A to raise trunk; sidelying to sit x 2 trials, pt reported significant dizziness in sitting, she was unable to tolerate sitting long enough to obtain BP  Transfers                    Ambulation/Gait                 Stairs             Wheelchair  Mobility    Modified Rankin (Stroke Patients Only)       Balance Overall balance assessment: Needs assistance Sitting-balance support: Single extremity supported;Feet supported Sitting balance-Leahy Scale: Fair                                      Cognition Arousal/Alertness: Lethargic;Suspect due to medications Behavior During Therapy: [facility] for tasks assessed/performed Overall Cognitive Status: Within Functional Limits for tasks assessed                                        Exercises General Exercises - Lower Extremity Ankle Circles/Pumps: AAROM;Both;10 reps;Supine Other Exercises Other Exercises: instructed pt in gluteal sets Other Exercises: isometric hip adduction with pillow in sidelying (pt cannot tolerate supine 2* pain) x 3 reps with 5 sec hold    General Comments        Pertinent Vitals/Pain Pain Score: 5  Pain Location: L low back and side at rest in bed Pain Descriptors / Indicators: Discomfort;Grimacing Pain Intervention(s): Limited activity within patient's tolerance;Monitored during session;Premedicated before session;Heat applied    Home Living                      Prior Function  PT Goals (current goals can now be found in the care plan section) Acute Rehab PT Goals Patient Stated Goal: to go home, to be able to walk farther PT Goal Formulation: With patient/family Time For Goal Achievement: 11/14/19 Potential to Achieve Goals: Good Progress towards PT goals: Not progressing toward goals - comment (decline in activity tolerance)    Frequency    Min 3X/week      PT Plan Discharge plan needs to be updated    Co-evaluation              AM-PAC PT "6 Clicks" Mobility   Outcome Measure  Help needed turning from your back to your side while in a flat bed without using bedrails?: A Little Help needed moving from lying on your back to sitting on the side of a flat bed without using  bedrails?: A Lot Help needed moving to and from a bed to a chair (including a wheelchair)?: Total Help needed standing up from a chair using your arms (e.g., wheelchair or bedside chair)?: Total Help needed to walk in hospital room?: Total Help needed climbing 3-5 steps with a railing? : Total 6 Click Score: 9    End of Session   Activity Tolerance: Patient limited by fatigue;Treatment limited secondary to medical complications (Comment) (dizziness) Patient left: in bed;with call bell/phone within reach;with family/visitor present;with bed alarm set Nurse Communication: Mobility status PT Visit Diagnosis: Pain;Dizziness and giddiness (R42);Difficulty in walking, not elsewhere classified (R26.2) Pain - Right/Left: Left     Time: 1937-9024 PT Time Calculation (min) (ACUTE ONLY): 21 min  Charges:  $Therapeutic Activity: 8-22 mins                     Blondell Reveal Kistler PT 10/30/2019  Acute Rehabilitation Services Pager [phone] Office [phone]

## 2019-10-30 NOTE — Progress Notes (Addendum)
Asked by primary MD to assess PleurX catheter.  Per report, the catheter drained ~ 50 ml during last drainage attempt.  Daughter (Czarian) at bedside during discussion with patient.  Reviewed with Mrs. Bottcher that we flushed the catheter last week and she tolerated well.  She indicates she does not want to have the catheter flushed now but might be willing to do at a later time.  The patient has anticipatory anxiety with any manipulation of the pleurX catheter.  Reviewed with daughter that if catheter is draining, it is patent.  She may have additional loculations at this point with minimal drainage.  Reviewed with Dr. Vaughan Browner, likely no further benefit of tPA / dornase at this time.  PCCM will be available as needed.     Noe Gens, MSN, NP-C [facility] 10/30/2019, 2:15 PM   Please see [email or web address] for pager details.

## 2019-10-30 NOTE — Progress Notes (Signed)
0641, pt called out asking about pain med, I told her I cant give her the morphine until we got IV access, and that I had put in a STAT order but given the time of the morning it may take a little longer because IV team draws labs off of central lines in the morning.  She stated she understood, I told her all I have available to give is the tramadol, she was agreeable and took that.  I told her once we established IV access I have the morphine ready 0650 IV team now in room attempting to reestablish access

## 2019-10-30 NOTE — Progress Notes (Signed)
Daily Progress Note   Patient Name: Autumn Cook       Date: 10/30/2019 DOB: 04-25-1961  Age: 59 y.o. MRN#: [id number] Attending Physician: Alma Friendly, MD Primary Care Physician: Darreld Mclean, MD Admit Date: 10/16/2019  Reason for Consultation/Follow-up: Non pain symptom management and Pain control  Subjective:  Ms. Munoz is resting in bed, daughter at bedside. We discussed about patient's pain and non pain symptom management. She is trying to eat more fruit. Overall, about the same as yesterday.      Length of Stay: 14  Current Medications: Scheduled Meds:  . acetaminophen  500 mg Oral TID WC  . atenolol  100 mg Oral Daily  . capecitabine  1,500 mg Oral BID PC  . pulmozyme (DORNASE) for intrapleural administration  5 mg Intrapleural Once  . enoxaparin (LOVENOX) injection  40 mg Subcutaneous Q24H  . feeding supplement (ENSURE ENLIVE)  237 mL Oral TID BM  . fentaNYL  1 patch Transdermal Q72H  . fentaNYL  1 patch Transdermal Q72H  . hydrocortisone cream   Topical BID  . insulin aspart  0-15 Units Subcutaneous Q4H  . mouth rinse  15 mL Mouth Rinse BID  . polyethylene glycol  17 g Oral BID  . promethazine  12.5 mg Oral Q6H  . senna  2 tablet Oral BID  . sucralfate  1 g Oral TID WC & HS  . venlafaxine XR  75 mg Oral 1 day or 1 dose   PRN Meds: ipratropium-albuterol, LORazepam, metoprolol tartrate, morphine injection, prochlorperazine **OR** prochlorperazine, sorbitol, traMADol  Physical Exam     General: Awake,  in no acute distress.  Heart: Regular rate and rhythm.  Lungs: Decreased Abdomen: Soft, non distended.  Ext: No significant edema Skin: Warm and dry      Vital Signs: BP 114/75   Pulse (!) 103   Temp 98.4 F (36.9 C) (Oral)   Resp 18   Ht 5\' 6"   (1.676 m)   Wt 81.3 kg   LMP 11/14/2013   SpO2 100%   BMI 28.94 kg/m  SpO2: SpO2: 100 % O2 Device: O2 Device: Nasal Cannula O2 Flow Rate: O2 Flow Rate (L/min): 2 L/min  Intake/output summary:   Intake/Output Summary (Last 24 hours) at 10/30/2019 1157 Last data filed at 10/29/2019 2159 Gross per 24  hour  Intake 230 ml  Output --  Net 230 ml   LBM: Last BM Date: 10/25/19 Baseline Weight: Weight: 80.3 kg Most recent weight: Weight: 81.3 kg       Palliative Assessment/Data: Flowsheet Rows     Most Recent Value  Intake Tab  Referral Department Hospitalist  Unit at Time of Referral Other (Comment)  [urology/telementry]  Palliative Care Primary Diagnosis Cancer  Date Notified 10/20/19  Palliative Care Type New Palliative care  Reason for referral Pain, Non-pain Symptom  Date of Admission 10/16/19  Date first seen by Palliative Care 10/20/19  # of days Palliative referral response time 0 Day(s)  # of days IP prior to Palliative referral 4  Clinical Assessment  Psychosocial & Spiritual Assessment  Palliative Care Outcomes     Patient Active Problem List   Diagnosis Date Noted  . Cancer associated pain   . Therapeutic opioid induced constipation   . Generalized pain   . General weakness   . Palliative care by specialist   . Acute respiratory failure ([place]) 10/16/2019  . Diabetes mellitus type 2, uncontrolled, with complications ([place]) 89/38/1017  . Acute respiratory failure with hypoxia ([place]) 10/16/2019  . Pneumonia of right lower lobe due to infectious organism 10/16/2019  . Metastases to the liver ([place]) 10/10/2019  . 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 Pleural effusion on left 10/09/2019  . Type 2 diabetes mellitus with hyperglycemia ([place]) 10/09/2019  . Encounter for central line placement 08/13/2018  . Port-A-Cath in place 08/07/2018  . Family history of lung cancer   . Malignant neoplasm of overlapping sites of right breast in female, estrogen receptor positive  ([place]) 07/23/2018  . Achilles tendonitis, bilateral 06/11/2015  . Chest pain with moderate risk for cardiac etiology 10/13/2014  . Hypercholesterolemia 10/13/2014  . Idiopathic scoliosis 08/18/2014  . Hypertension 09/11/2012  . Overweight (BMI 25.0-29.9) 09/11/2012  . Migraine headache 09/11/2012  . Hx of thyroid nodule 09/11/2012  . History of tobacco use 09/11/2012    Palliative Care Assessment & Plan   Patient Profile: 59 year old female with metastatic breast cancer under the care of [name].  She is undergoing chemotherapy and radiation and is status post Pleurx catheter placement with cytology revealing malignant cells.  Currently admitted for discomfort and shortness of breath and pulmonary working to increase drainage from Pleurx catheter.  Plan on discharge she is for follow-up with [name] for further treatment with capecitabine.  Palliative following for pain management.  Recommendations/Plan: -Pain:  Continue fentanyl patch 37.5 mcg/h. Discussed with daughter at bedside about current pain and non pain symptom management. Medication history noted.  Encourage PT, encourage OOB, encourage PO  -Constipation: Opioid related.  Continue Miralax and senna.    Goals of Care and Additional Recommendations:  Limitations on Scope of Treatment: Full Scope Treatment  Code Status:    Code Status Orders  (From admission, onward)         Start     Ordered   10/16/19 0803  Full code  Continuous        10/16/19 0808        Code Status History    Date Active Date Inactive Code Status Order ID Comments User Context   10/09/2019 2245 10/14/2019 2035 Full Code [id number]  Rise Patience, MD Inpatient   Advance Care Planning Activity       Prognosis:   Unable to determine  Discharge Planning:  Home with Home Health vs SNF for rehab  Care plan  was discussed with patient's daughters.   Thank you for allowing the Palliative Medicine Team to assist in the care of  this patient.    Total Time 25 Prolonged Time Billed No   Greater than 50%  of this time was spent counseling and coordinating care related to the above assessment and plan.  Loistine Chance, MD  Please contact Palliative Medicine Team phone at [phone] for questions and concerns.

## 2019-10-30 NOTE — Progress Notes (Signed)
Patient was able to void in [facility]. Pre-void bladder vol 456ml; voided 334ml, post void bladder scan 12ml.

## 2019-10-31 LAB — PROCALCITONIN: Procalcitonin: 2.46 ng/mL

## 2019-10-31 LAB — COMPREHENSIVE METABOLIC PANEL
ALT: 24 U/L (ref 0–44)
AST: 67 U/L — ABNORMAL HIGH (ref 15–41)
Albumin: 2.2 g/dL — ABNORMAL LOW (ref 3.5–5.0)
Alkaline Phosphatase: 94 U/L (ref 38–126)
Anion gap: 14 (ref 5–15)
BUN: 46 mg/dL — ABNORMAL HIGH (ref 6–20)
CO2: 23 mmol/L (ref 22–32)
Calcium: 9.4 mg/dL (ref 8.9–10.3)
Chloride: 98 mmol/L (ref 98–111)
Creatinine, Ser: 1.02 mg/dL — ABNORMAL HIGH (ref 0.44–1.00)
GFR calc Af Amer: >60 mL/min (ref >60)
GFR calc non Af Amer: >60 mL/min (ref >60)
Glucose, Bld: 134 mg/dL — ABNORMAL HIGH (ref 70–99)
Potassium: 5.1 mmol/L (ref 3.5–5.1)
Sodium: 135 mmol/L (ref 135–145)
Total Bilirubin: 0.7 mg/dL (ref 0.3–1.2)
Total Protein: 6.9 g/dL (ref 6.5–8.1)

## 2019-10-31 LAB — CBC WITH DIFFERENTIAL/PLATELET
Abs Immature Granulocytes: 0.19 10*3/uL — ABNORMAL HIGH (ref 0.00–0.07)
Basophils Absolute: 0 10*3/uL (ref 0.0–0.1)
Basophils Relative: 0 %
Eosinophils Absolute: 0.1 10*3/uL (ref 0.0–0.5)
Eosinophils Relative: 1 %
HCT: 24.5 % — ABNORMAL LOW (ref 36.0–46.0)
Hemoglobin: 8 g/dL — ABNORMAL LOW (ref 12.0–15.0)
Immature Granulocytes: 1 %
Lymphocytes Relative: 6 %
Lymphs Abs: 0.9 10*3/uL (ref 0.7–4.0)
MCH: 28.4 pg (ref 26.0–34.0)
MCHC: 32.7 g/dL (ref 30.0–36.0)
MCV: 86.9 fL (ref 80.0–100.0)
Monocytes Absolute: 0.5 10*3/uL (ref 0.1–1.0)
Monocytes Relative: 4 %
Neutro Abs: 12.2 10*3/uL — ABNORMAL HIGH (ref 1.7–7.7)
Neutrophils Relative %: 88 %
Platelets: 538 10*3/uL — ABNORMAL HIGH (ref 150–400)
RBC: 2.82 MIL/uL — ABNORMAL LOW (ref 3.87–5.11)
RDW: 14.2 % (ref 11.5–15.5)
WBC: 13.9 10*3/uL — ABNORMAL HIGH (ref 4.0–10.5)
nRBC: 0.1 % (ref 0.0–0.2)

## 2019-10-31 LAB — GLUCOSE, CAPILLARY
Glucose-Capillary: 125 mg/dL — ABNORMAL HIGH (ref 70–99)
Glucose-Capillary: 170 mg/dL — ABNORMAL HIGH (ref 70–99)
Glucose-Capillary: 154 mg/dL — ABNORMAL HIGH (ref 70–99)
Glucose-Capillary: 138 mg/dL — ABNORMAL HIGH (ref 70–99)
Glucose-Capillary: 139 mg/dL — ABNORMAL HIGH (ref 70–99)
Glucose-Capillary: 119 mg/dL — ABNORMAL HIGH (ref 70–99)

## 2019-10-31 LAB — URINALYSIS, ROUTINE W REFLEX MICROSCOPIC
Bilirubin Urine: NEGATIVE
Glucose, UA: NEGATIVE mg/dL
Hgb urine dipstick: NEGATIVE
Ketones, ur: 5 mg/dL — AB
Leukocytes,Ua: NEGATIVE
Nitrite: NEGATIVE
Protein, ur: NEGATIVE mg/dL
Specific Gravity, Urine: 1.023 (ref 1.005–1.030)
pH: 5 (ref 5.0–8.0)

## 2019-10-31 MED ORDER — LEVALBUTEROL HCL 0.63 MG/3ML IN NEBU
0.6300 mg | INHALATION_SOLUTION | Freq: Three times a day (TID) | RESPIRATORY_TRACT | Status: DC
  Administered 2019-10-31 – 2019-11-05 (×13): 0.63 mg via RESPIRATORY_TRACT
  Filled 2019-10-31 (×15): qty 3

## 2019-10-31 MED ORDER — SODIUM CHLORIDE 0.9 % IV SOLN
2.0000 g | Freq: Three times a day (TID) | INTRAVENOUS | Status: DC
  Administered 2019-10-31 – 2019-11-06 (×19): 2 g via INTRAVENOUS
  Filled 2019-10-31 (×21): qty 2

## 2019-10-31 MED ORDER — LEVALBUTEROL HCL 0.63 MG/3ML IN NEBU: 0.6300 mg | INHALATION_SOLUTION | Freq: Three times a day (TID) | RESPIRATORY_TRACT | Status: DC

## 2019-10-31 MED ORDER — VENLAFAXINE HCL ER 75 MG PO CP24
75.0000 mg | ORAL_CAPSULE | Freq: Every day | ORAL | Status: AC
  Administered 2019-10-31 – 2019-11-06 (×7): 75 mg via ORAL
  Filled 2019-10-31 (×7): qty 1

## 2019-10-31 NOTE — Progress Notes (Signed)
Pharmacy Antibiotic Note  Autumn Cook is a 59 y.o. female admitted on 10/16/2019 with hx of breast ca, HTN DM2 who was admitted for pain and SOB.  Pharmacy has been consulted for cefepime for HCAP.  Plan: Cefepime 2gm IV q8h Follow renal function, cultures  and clinical course  Height: 5\' 6"  (167.6 cm) Weight: 81.3 kg (179 lb 4.8 oz) IBW/kg (Calculated) : 59.3  Temp (24hrs), Avg:97.8 F (36.6 C), Min:97.6 F (36.4 C), Max:98 F (36.7 C)  Recent Labs  Lab 10/27/19 0500 10/28/19 0517 10/29/19 0644 10/30/19 0531 10/31/19 0546  WBC 9.5 12.9* 15.2* 15.9* 13.9*  CREATININE 0.69 0.79 1.02* 1.00 1.02*    Estimated Creatinine Clearance: 65.4 mL/min (A) (by C-G formula based on SCr of 1.02 mg/dL (H)).    Allergies  Allergen Reactions  . Penicillins Hives    Did it involve swelling of the face/tongue/throat, SOB, or low BP? Yes--syncope episode Did it involve sudden or severe rash/hives, skin peeling, or any reaction on the inside of your mouth or nose? No Did you need to seek medical attention at a hospital or doctor's office? Yes When did it last happen? 37-38 YEARS AGO If all above answers are "NO", may proceed with cephalosporin use.   . Metformin And Related Diarrhea  . Adhesive [Tape] Other (See Comments)    "severe bruising"  ALSO SKIN GLUE  . Asa [Aspirin] Other (See Comments)    GI upset   . Mango Flavor Hives  . Olive Tree Hives    Pt tolerates olive oil    Antimicrobials this admission:  6/10 vanc x1 6/10 Cefepime>>6/11 6/25 cefepime >>  Dose adjustments this admission:    Microbiology results:  6/7 MRSA PCR: neg 6/10 BCx x2: NGF 6/10 pleural fluid:NGF 6/25 BCx:  6/25 UCx:    Thank you for allowing pharmacy to be a part of this patient's care.  Dolly Rias RPh 10/31/2019, 8:17 AM

## 2019-10-31 NOTE — Progress Notes (Signed)
PROGRESS NOTE    Autumn Cook  WNI:[id number] DOB: 08-16-1960 DOA: 10/16/2019 PCP: Darreld Mclean, MD   Chief Complaint  Patient presents with  . Shortness of Breath  . Chest Pain    Brief Narrative:  Patient is a 59 year old female with history of right breast cancer, hypertension, hyperlipidemia, type 2 diabetes mellitus, asthma who presents to the emergency department with complaints of central sharp chest pain as well as shortness of breath. She was recently admitted here and discharged 2 days prior to presentation when she underwent Pleurx catheter placement by PCCM after she was found to have large left pleural effusion secondary to her breast cancer. Patient did not know how to use the Pleurx catheter at home. On presentation, chest x-ray showed complete white out of the left lung but there was also suspicion for new consolidations on the right side concerning for pneumonia for which she was started on antibiotics.  Patient admitted for further management.   Assessment & Plan:   Active Problems:   Hypertension   History of tobacco use   Hypercholesterolemia   Malignant neoplasm of overlapping sites of right breast in female, estrogen receptor positive ([place])   Encounter for central line placement   Malignant pleural effusion   Pleural effusion on left   Metastases to the liver [facility])   Acute respiratory failure (HCC)   Diabetes mellitus type 2, uncontrolled, with complications (HCC)   Acute respiratory failure with hypoxia (HCC)   Pneumonia of right lower lobe due to infectious organism   Generalized pain   General weakness   Palliative care by specialist   Cancer associated pain   Therapeutic opioid induced constipation  Acute on chronic respiratory failure with hypoxia likely 2/2 left-sided extensivemalignantpleural effusion On 2 L of oxygen at home at baseline Chest tube clamped per PCCM, resume 2x weekly drainage - has outpatient pulm f/u 7/8 at 0930 with  Geraldo Pitter  - they've reviewed drainage with pt Pulm suspects element of trapped lung, won't be candidate for pleurodesis due to this On 6/22 only 50 cc out - repeat CXR 6/23 showed unchanged large left pleural effusion with collapse of the majority of the left lung Spoke to [place] RN from PCCM on 10/30/19 about pleurx drainage and care, saw pt, pt refusing to have pleurx drained due to anticipated pain/anxiety  Oncology following, appreciate recommendations Palliative care following for pain control, appreciate assistance  Malignantneoplasm of the right breast [name] is following. Capecitabine started on 10/28/2019  Leukocytosis Ongoing Previous blood cultures no growth till date, repeat BC pending  Chest x-ray as mentioned above, no pneumonia noted UA neg for infection, UC pending Procalcitonin elevated, will trend Start empiric coverage with IV cefepime Daily CBC  Hypertension Blood pressure stable at this time Continue atenolol, monitor closely  Diabetes mellitus A1c of 7.8. Continue sliding scaleinsulin for now.She is not on any medications at home. Consider Metformin 500 mg twice a day on discharge  Goals of care Poor prognosis Discussed extensively with daughter about goals of care, wants to continue current treatment plan Palliative care consulted for pain management, goals of care   DVT prophylaxis: (lovenox Code Status: full  Family Communication: Discussed extensively with daughter at bedside Disposition:   Status is: Inpatient  Remains inpatient appropriate because:Inpatient level of care appropriate due to severity of illness   Dispo: The patient is from: Home              Anticipated d/c is to: SNF  Anticipated d/c date is: > 3 days              Patient currently is not medically stable to d/c.   Consultants:   PCCM  Pulm  Oncology  Procedures:  none  Antimicrobials: Anti-infectives (From admission, onward)   Start      Dose/Rate Route Frequency Ordered Stop   10/31/19 0900  ceFEPIme (MAXIPIME) 2 g in sodium chloride 0.9 % 100 mL IVPB     Discontinue     2 g 200 mL/hr over 30 Minutes Intravenous Every 8 hours 10/31/19 0810     10/16/19 1400  ceFEPIme (MAXIPIME) 2 g in sodium chloride 0.9 % 100 mL IVPB  Status:  Discontinued        2 g 200 mL/hr over 30 Minutes Intravenous Every 8 hours 10/16/19 0817 10/17/19 1426   10/16/19 0530  vancomycin (VANCOCIN) IVPB 1000 mg/200 mL premix        1,000 mg 200 mL/hr over 60 Minutes Intravenous  Once 10/16/19 0520 10/16/19 0741   10/16/19 0530  ceFEPIme (MAXIPIME) 2 g in sodium chloride 0.9 % 100 mL IVPB        2 g 200 mL/hr over 30 Minutes Intravenous  Once 10/16/19 0520 10/16/19 0741      Subjective: Patient noted to be very deconditioned, chronically ill-appearing, lethargic.  Pain, around left Pleurx catheter, denies any chest pain, abdominal pain, nausea/vomiting, fever/chills.   Objective: Vitals:   10/31/19 0522 10/31/19 0600 10/31/19 1426 10/31/19 1510  BP: (!) 124/58  103/85   Pulse: (!) 104  88   Resp:  20 16   Temp:   97.6 F (36.4 C)   TempSrc:      SpO2: 94%  100% 98%  Weight:      Height:        Intake/Output Summary (Last 24 hours) at 10/31/2019 1554 Last data filed at 10/31/2019 1300 Gross per 24 hour  Intake 600 ml  Output 1169 ml  Net -569 ml   Filed Weights   10/16/19 0431 10/27/19 1854 10/28/19 0406  Weight: 80.3 kg 83.1 kg 81.3 kg    Examination:  General: NAD, lethargic, very deconditioned  Cardiovascular: S1, S2 present  Respiratory:  Diminished breath sounds on the left, with noted Pleurx  Abdomen: Soft, nontender, nondistended, bowel sounds present  Musculoskeletal: No bilateral pedal edema noted  Skin: Normal  Psychiatry:  Poor mood    Data Reviewed: I have personally reviewed following labs and imaging studies  CBC: Recent Labs  Lab 10/27/19 0500 10/28/19 0517 10/29/19 0644 10/30/19 0531  10/31/19 0546  WBC 9.5 12.9* 15.2* 15.9* 13.9*  NEUTROABS 8.1* 11.1* 13.3* 13.9* 12.2*  HGB 8.2* 8.3* 8.2* 8.2* 8.0*  HCT 25.0* 25.0* 25.1* 25.7* 24.5*  MCV 88.3 86.8 87.5 87.7 86.9  PLT 449* 492* 504* 594* 538*    Basic Metabolic Panel: Recent Labs  Lab 10/25/19 0606 10/25/19 0616 10/26/19 0542 10/26/19 0542 10/27/19 0500 10/28/19 0517 10/29/19 0644 10/30/19 0531 10/31/19 0546  NA  --    < > 137   < > 136 138 138 137 135  K  --    < > 4.2   < > 4.3 4.4 4.5 4.8 5.1  CL  --    < > 104   < > 100 102 99 100 98  CO2  --    < > 25   < > 22 22 22  21* 23  GLUCOSE  --    < >  151*   < > 154* 139* 135* 127* 134*  BUN  --    < > 18   < > 20 24* 35* 39* 46*  CREATININE  --    < > 0.70   < > 0.69 0.79 1.02* 1.00 1.02*  CALCIUM  --    < > 9.1   < > 9.3 9.5 9.4 9.4 9.4  MG 1.7  --  1.6*  --  1.7 1.7 1.9  --   --   PHOS  --   --  3.7  --  4.8* 4.8* 4.6  --   --    < > = values in this interval not displayed.    GFR: Estimated Creatinine Clearance: 65.4 mL/min (A) (by C-G formula based on SCr of 1.02 mg/dL (H)).  Liver Function Tests: Recent Labs  Lab 10/27/19 0500 10/28/19 0517 10/29/19 0644 10/30/19 0531 10/31/19 0546  AST 43* 41 46* 62* 67*  ALT 25 22 21 21 24   ALKPHOS 78 76 76 85 94  BILITOT 0.3 0.8 0.6 1.0 0.7  PROT 6.3* 6.3* 6.5 6.8 6.9  ALBUMIN 2.5* 2.4* 2.3* 2.3* 2.2*    CBG: Recent Labs  Lab 10/30/19 2025 10/30/19 2343 10/31/19 0513 10/31/19 0851 10/31/19 1137  GLUCAP 139* 135* 125* 170* 154*     No results found for this or any previous visit (from the past 240 hour(s)).       Radiology Studies: No results found.      Scheduled Meds: . acetaminophen  500 mg Oral TID WC  . atenolol  100 mg Oral Daily  . capecitabine  1,500 mg Oral BID PC  . pulmozyme (DORNASE) for intrapleural administration  5 mg Intrapleural Once  . enoxaparin (LOVENOX) injection  40 mg Subcutaneous Q24H  . feeding supplement (ENSURE ENLIVE)  237 mL Oral TID BM  .  fentaNYL  1 patch Transdermal Q72H  . fentaNYL  1 patch Transdermal Q72H  . hydrocortisone cream   Topical BID  . insulin aspart  0-15 Units Subcutaneous Q4H  . levalbuterol  0.63 mg Nebulization TID  . mouth rinse  15 mL Mouth Rinse BID  . polyethylene glycol  17 g Oral BID  . promethazine  12.5 mg Oral Q6H  . senna  2 tablet Oral BID  . sucralfate  1 g Oral TID WC & HS  . venlafaxine XR  75 mg Oral Q breakfast   Continuous Infusions: . ceFEPime (MAXIPIME) IV 2 g (10/31/19 1048)     LOS: 15 days     Alma Friendly, MD [facility]  10/31/2019, 3:54 PM

## 2019-11-01 LAB — CBC WITH DIFFERENTIAL/PLATELET
Abs Immature Granulocytes: 0.13 10*3/uL — ABNORMAL HIGH (ref 0.00–0.07)
Basophils Absolute: 0 10*3/uL (ref 0.0–0.1)
Basophils Relative: 0 %
Eosinophils Absolute: 0 10*3/uL (ref 0.0–0.5)
Eosinophils Relative: 0 %
HCT: 26.1 % — ABNORMAL LOW (ref 36.0–46.0)
Hemoglobin: 8.3 g/dL — ABNORMAL LOW (ref 12.0–15.0)
Immature Granulocytes: 1 %
Lymphocytes Relative: 5 %
Lymphs Abs: 0.6 10*3/uL — ABNORMAL LOW (ref 0.7–4.0)
MCH: 28.1 pg (ref 26.0–34.0)
MCHC: 31.8 g/dL (ref 30.0–36.0)
MCV: 88.5 fL (ref 80.0–100.0)
Monocytes Absolute: 0.4 10*3/uL (ref 0.1–1.0)
Monocytes Relative: 4 %
Neutro Abs: 9.9 10*3/uL — ABNORMAL HIGH (ref 1.7–7.7)
Neutrophils Relative %: 90 %
Platelets: 514 10*3/uL — ABNORMAL HIGH (ref 150–400)
RBC: 2.95 MIL/uL — ABNORMAL LOW (ref 3.87–5.11)
RDW: 14.6 % (ref 11.5–15.5)
WBC: 11.1 10*3/uL — ABNORMAL HIGH (ref 4.0–10.5)
nRBC: 0.3 % — ABNORMAL HIGH (ref 0.0–0.2)

## 2019-11-01 LAB — COMPREHENSIVE METABOLIC PANEL
ALT: 22 U/L (ref 0–44)
AST: 52 U/L — ABNORMAL HIGH (ref 15–41)
Albumin: 2.3 g/dL — ABNORMAL LOW (ref 3.5–5.0)
Alkaline Phosphatase: 94 U/L (ref 38–126)
Anion gap: 15 (ref 5–15)
BUN: 45 mg/dL — ABNORMAL HIGH (ref 6–20)
CO2: 22 mmol/L (ref 22–32)
Calcium: 9.7 mg/dL (ref 8.9–10.3)
Chloride: 102 mmol/L (ref 98–111)
Creatinine, Ser: 0.83 mg/dL (ref 0.44–1.00)
GFR calc Af Amer: >60 mL/min (ref >60)
GFR calc non Af Amer: >60 mL/min (ref >60)
Glucose, Bld: 131 mg/dL — ABNORMAL HIGH (ref 70–99)
Potassium: 4.8 mmol/L (ref 3.5–5.1)
Sodium: 139 mmol/L (ref 135–145)
Total Bilirubin: 0.6 mg/dL (ref 0.3–1.2)
Total Protein: 6.8 g/dL (ref 6.5–8.1)

## 2019-11-01 LAB — URINE CULTURE: Culture: 60000 — AB

## 2019-11-01 LAB — GLUCOSE, CAPILLARY
Glucose-Capillary: 141 mg/dL — ABNORMAL HIGH (ref 70–99)
Glucose-Capillary: 132 mg/dL — ABNORMAL HIGH (ref 70–99)
Glucose-Capillary: 125 mg/dL — ABNORMAL HIGH (ref 70–99)
Glucose-Capillary: 105 mg/dL — ABNORMAL HIGH (ref 70–99)
Glucose-Capillary: 172 mg/dL — ABNORMAL HIGH (ref 70–99)
Glucose-Capillary: 132 mg/dL — ABNORMAL HIGH (ref 70–99)

## 2019-11-01 LAB — PROCALCITONIN: Procalcitonin: 1.7 ng/mL

## 2019-11-01 MED ORDER — NYSTATIN 100000 UNIT/ML MT SUSP
5.0000 mL | Freq: Four times a day (QID) | OROMUCOSAL | Status: DC
  Administered 2019-11-01 – 2019-11-13 (×30): 500000 [IU] via ORAL
  Filled 2019-11-01 (×44): qty 5

## 2019-11-01 NOTE — Progress Notes (Signed)
Tegaderm covering Pleurx noted to be lifted on one side. Patient able to turn self onto R side for dressing change. Pt tolerated fair, voiced discomfort with some pressure while on R side. Pt returned to back and moved up in bed. Pt voiced relief and denied need for pain medication at this time. Pt also denys need for Pleurx to be drained. O2 98% on 2L, denies being SOB, and not in any distress. Will continue to monitor patient.

## 2019-11-01 NOTE — Progress Notes (Signed)
PROGRESS NOTE    Autumn Cook  YQI:[id number] DOB: 06/25/1960 DOA: 10/16/2019 PCP: Darreld Mclean, MD   Chief Complaint  Patient presents with  . Shortness of Breath  . Chest Pain    Brief Narrative:  Patient is a 60 year old female with history of right breast cancer, hypertension, hyperlipidemia, type 2 diabetes mellitus, asthma who presents to the emergency department with complaints of central sharp chest pain as well as shortness of breath. She was recently admitted here and discharged 2 days prior to presentation when she underwent Pleurx catheter placement by PCCM after she was found to have large left pleural effusion secondary to her breast cancer. Patient did not know how to use the Pleurx catheter at home. On presentation, chest x-ray showed complete white out of the left lung but there was also suspicion for new consolidations on the right side concerning for pneumonia for which she was started on antibiotics.  Patient admitted for further management.   Assessment & Plan:   Active Problems:   Hypertension   History of tobacco use   Hypercholesterolemia   Malignant neoplasm of overlapping sites of right breast in female, estrogen receptor positive ([place])   Encounter for central line placement   Malignant pleural effusion   Pleural effusion on left   Metastases to the liver [facility])   Acute respiratory failure (HCC)   Diabetes mellitus type 2, uncontrolled, with complications (HCC)   Acute respiratory failure with hypoxia (HCC)   Pneumonia of right lower lobe due to infectious organism   Generalized pain   General weakness   Palliative care by specialist   Cancer associated pain   Therapeutic opioid induced constipation   Acute on chronic respiratory failure with hypoxia likely 2/2 left-sided extensivemalignantpleural effusion On 2 L of oxygen at home at baseline Chest tube clamped per PCCM, resume 2x weekly drainage - has outpatient pulm f/u 7/8 at 0930 with  Geraldo Pitter  - they've reviewed drainage with pt Pulm suspects element of trapped lung, won't be candidate for pleurodesis due to this On 6/22 only 50 cc out - repeat CXR 6/23 showed unchanged large left pleural effusion with collapse of the majority of the left lung Spoke to [place] RN from PCCM on 10/30/19 about pleurx drainage and care, saw pt, pt refusing to have pleurx drained due to anticipated pain/anxiety  Spoke to Dr Chase Caller on 11/01/19 about plans for pleurx management, will follow up on the best option as this is a very difficult case Oncology following, appreciate recommendations Palliative care following for pain control, appreciate assistance  Malignantneoplasm of the right breast [name] is following. Capecitabine started on 10/28/2019  ?Possible HCAP Afebrile, with improving leukocytosis Previous blood cultures no growth till date, repeat BC NGTD Chest x-ray as mentioned above, no pneumonia noted UA neg for infection, UC with multiple species  Procalcitonin downtrending, will trend Continue empiric IV cefepime Daily CBC  Hypertension Blood pressure stable at this time Continue atenolol, monitor closely  Diabetes mellitus A1c of 7.8. Continue sliding scaleinsulin for now.She is not on any medications at home. Consider Metformin 500 mg twice a day on discharge  Goals of care Poor prognosis Discussed extensively with daughter about goals of care, wants to continue current treatment plan Very difficult situation, as patient is very weak/deconditioned, refusing even to attempt to drain/flush Pleurx, spends most time sleepy/lethargic in bed.  Daughter stating the plan to take her home with palliative Palliative care consulted for pain management, goals of care  DVT prophylaxis: (lovenox Code Status: full  Family Communication: Discussed extensively with daughter at bedside on 11/01/2019 Disposition:   Status is: Inpatient  Remains inpatient appropriate  because:Inpatient level of care appropriate due to severity of illness   Dispo: The patient is from: Home              Anticipated d/c is to: Home              Anticipated d/c date is: 2 days              Patient currently is not medically stable to d/c.   Consultants:   PCCM  Pulm  Oncology  Palliative  Procedures:  None  Antimicrobials: Anti-infectives (From admission, onward)   Start     Dose/Rate Route Frequency Ordered Stop   10/31/19 0900  ceFEPIme (MAXIPIME) 2 g in sodium chloride 0.9 % 100 mL IVPB     Discontinue     2 g 200 mL/hr over 30 Minutes Intravenous Every 8 hours 10/31/19 0810     10/16/19 1400  ceFEPIme (MAXIPIME) 2 g in sodium chloride 0.9 % 100 mL IVPB  Status:  Discontinued        2 g 200 mL/hr over 30 Minutes Intravenous Every 8 hours 10/16/19 0817 10/17/19 1426   10/16/19 0530  vancomycin (VANCOCIN) IVPB 1000 mg/200 mL premix        1,000 mg 200 mL/hr over 60 Minutes Intravenous  Once 10/16/19 0520 10/16/19 0741   10/16/19 0530  ceFEPIme (MAXIPIME) 2 g in sodium chloride 0.9 % 100 mL IVPB        2 g 200 mL/hr over 30 Minutes Intravenous  Once 10/16/19 0520 10/16/19 0741      Subjective: Patient continues to be deconditioned, lethargic, refusing any care for the Pleurx due to pain, denies any chest pain, significant worsening shortness of breath, nausea/vomiting, fever/chills.   Objective: Vitals:   11/01/19 0822 11/01/19 0949 11/01/19 1307 11/01/19 1424  BP:  117/73 107/73   Pulse:  98 92   Resp:   14   Temp:   99.1 F (37.3 C)   TempSrc:   Oral   SpO2: 98% 100% 100% 98%  Weight:      Height:        Intake/Output Summary (Last 24 hours) at 11/01/2019 1655 Last data filed at 11/01/2019 0700 Gross per 24 hour  Intake 384.2 ml  Output --  Net 384.2 ml   Filed Weights   10/16/19 0431 10/27/19 1854 10/28/19 0406  Weight: 80.3 kg 83.1 kg 81.3 kg    Examination:  General: NAD, lethargic, very deconditioned  Cardiovascular: S1, S2  present  Respiratory:  Diminished breath sounds on the left, with noted Pleurx drain  Abdomen: Soft, nontender, nondistended, bowel sounds present  Musculoskeletal: No bilateral pedal edema noted  Skin: Normal  Psychiatry:  Poor mood    Data Reviewed: I have personally reviewed following labs and imaging studies  CBC: Recent Labs  Lab 10/28/19 0517 10/29/19 0644 10/30/19 0531 10/31/19 0546 11/01/19 0545  WBC 12.9* 15.2* 15.9* 13.9* 11.1*  NEUTROABS 11.1* 13.3* 13.9* 12.2* 9.9*  HGB 8.3* 8.2* 8.2* 8.0* 8.3*  HCT 25.0* 25.1* 25.7* 24.5* 26.1*  MCV 86.8 87.5 87.7 86.9 88.5  PLT 492* 504* 594* 538* 514*    Basic Metabolic Panel: Recent Labs  Lab 10/26/19 0542 10/26/19 0542 10/27/19 0500 10/27/19 0500 10/28/19 0517 10/29/19 0644 10/30/19 0531 10/31/19 0546 11/01/19 0545  NA 137   < >  136   < > 138 138 137 135 139  K 4.2   < > 4.3   < > 4.4 4.5 4.8 5.1 4.8  CL 104   < > 100   < > 102 99 100 98 102  CO2 25   < > 22   < > 22 22 21* 23 22  GLUCOSE 151*   < > 154*   < > 139* 135* 127* 134* 131*  BUN 18   < > 20   < > 24* 35* 39* 46* 45*  CREATININE 0.70   < > 0.69   < > 0.79 1.02* 1.00 1.02* 0.83  CALCIUM 9.1   < > 9.3   < > 9.5 9.4 9.4 9.4 9.7  MG 1.6*  --  1.7  --  1.7 1.9  --   --   --   PHOS 3.7  --  4.8*  --  4.8* 4.6  --   --   --    < > = values in this interval not displayed.    GFR: Estimated Creatinine Clearance: 80.4 mL/min (by C-G formula based on SCr of 0.83 mg/dL).  Liver Function Tests: Recent Labs  Lab 10/28/19 0517 10/29/19 0644 10/30/19 0531 10/31/19 0546 11/01/19 0545  AST 41 46* 62* 67* 52*  ALT 22 21 21 24 22   ALKPHOS 76 76 85 94 94  BILITOT 0.8 0.6 1.0 0.7 0.6  PROT 6.3* 6.5 6.8 6.9 6.8  ALBUMIN 2.4* 2.3* 2.3* 2.2* 2.3*    CBG: Recent Labs  Lab 10/31/19 1654 10/31/19 2006 11/01/19 0516 11/01/19 0722 11/01/19 1154  GLUCAP 139* 119* 132* 125* 105*     Recent Results (from the past 240 hour(s))  Urine Culture     Status:  Abnormal   Collection Time: 10/31/19  5:37 AM   Specimen: Urine, Clean Catch  Result Value Ref Range Status   Specimen Description   Final    URINE, CLEAN CATCH Performed at [facility], [place] [address], [place], [place] [address]    Special Requests   Final    NONE Performed at [facility], [place] [address] [place], [place] [address]    Culture (A)  Final    60,000 COLONIES/mL MULTIPLE SPECIES PRESENT, SUGGEST RECOLLECTION   Report Status 11/01/2019 FINAL  Final  Culture, blood (routine x 2)     Status: None (Preliminary result)   Collection Time: 10/31/19  5:41 AM   Specimen: BLOOD  Result Value Ref Range Status   Specimen Description   Final    BLOOD LEFT ARM Performed at [place] [address] [place], [place] [address]    Special Requests   Final    BOTTLES DRAWN AEROBIC ONLY Blood Culture results may not be optimal due to an inadequate volume of blood received in culture bottles Performed at [place] [address], [place], [place] [address]    Culture   Final    NO GROWTH 1 DAY Performed at [facility], [place] [address], [place], [place] [address]    Report Status PENDING  Incomplete  Culture, blood (routine x 2)     Status: None (Preliminary result)   Collection Time: 10/31/19  5:46 AM   Specimen: BLOOD LEFT HAND  Result Value Ref Range Status   Specimen Description   Final    BLOOD LEFT HAND Performed at [place] [address] [place], [place]  [address]    Special Requests   Final    BOTTLES DRAWN AEROBIC ONLY Blood Culture results may not be optimal due to an inadequate volume of blood received in culture bottles Performed at [facility], [place] [address], [place], [place] [address]    Culture   Final    NO GROWTH 1 DAY Performed at [facility], [place] [address], [place], [place] [address]    Report Status PENDING   Incomplete         Radiology Studies: No results found.      Scheduled Meds: . acetaminophen  500 mg Oral TID WC  . atenolol  100 mg Oral Daily  . capecitabine  1,500 mg Oral BID PC  . pulmozyme (DORNASE) for intrapleural administration  5 mg Intrapleural Once  . enoxaparin (LOVENOX) injection  40 mg Subcutaneous Q24H  . feeding supplement (ENSURE ENLIVE)  237 mL Oral TID BM  . fentaNYL  1 patch Transdermal Q72H  . fentaNYL  1 patch Transdermal Q72H  . hydrocortisone cream   Topical BID  . insulin aspart  0-15 Units Subcutaneous Q4H  . levalbuterol  0.63 mg Nebulization TID  . mouth rinse  15 mL Mouth Rinse BID  . nystatin  5 mL Oral QID  . polyethylene glycol  17 g Oral BID  . promethazine  12.5 mg Oral Q6H  . senna  2 tablet Oral BID  . sucralfate  1 g Oral TID WC & HS  . venlafaxine XR  75 mg Oral Q breakfast   Continuous Infusions: . ceFEPime (MAXIPIME) IV 2 g (11/01/19 0959)     LOS: 16 days     Alma Friendly, MD [facility]  11/01/2019, 4:55 PM

## 2019-11-02 LAB — COMPREHENSIVE METABOLIC PANEL
ALT: 19 U/L (ref 0–44)
AST: 45 U/L — ABNORMAL HIGH (ref 15–41)
Albumin: 2 g/dL — ABNORMAL LOW (ref 3.5–5.0)
Alkaline Phosphatase: 92 U/L (ref 38–126)
Anion gap: 12 (ref 5–15)
BUN: 39 mg/dL — ABNORMAL HIGH (ref 6–20)
CO2: 22 mmol/L (ref 22–32)
Calcium: 9.2 mg/dL (ref 8.9–10.3)
Chloride: 106 mmol/L (ref 98–111)
Creatinine, Ser: 0.74 mg/dL (ref 0.44–1.00)
GFR calc Af Amer: >60 mL/min (ref >60)
GFR calc non Af Amer: >60 mL/min (ref >60)
Glucose, Bld: 138 mg/dL — ABNORMAL HIGH (ref 70–99)
Potassium: 4.1 mmol/L (ref 3.5–5.1)
Sodium: 140 mmol/L (ref 135–145)
Total Bilirubin: 0.5 mg/dL (ref 0.3–1.2)
Total Protein: 6.4 g/dL — ABNORMAL LOW (ref 6.5–8.1)

## 2019-11-02 LAB — CBC WITH DIFFERENTIAL/PLATELET
Abs Immature Granulocytes: 0.16 10*3/uL — ABNORMAL HIGH (ref 0.00–0.07)
Basophils Absolute: 0 10*3/uL (ref 0.0–0.1)
Basophils Relative: 0 %
Eosinophils Absolute: 0.1 10*3/uL (ref 0.0–0.5)
Eosinophils Relative: 1 %
HCT: 23.3 % — ABNORMAL LOW (ref 36.0–46.0)
Hemoglobin: 7.6 g/dL — ABNORMAL LOW (ref 12.0–15.0)
Immature Granulocytes: 2 %
Lymphocytes Relative: 7 %
Lymphs Abs: 0.7 10*3/uL (ref 0.7–4.0)
MCH: 28.4 pg (ref 26.0–34.0)
MCHC: 32.6 g/dL (ref 30.0–36.0)
MCV: 86.9 fL (ref 80.0–100.0)
Monocytes Absolute: 0.4 10*3/uL (ref 0.1–1.0)
Monocytes Relative: 4 %
Neutro Abs: 8.5 10*3/uL — ABNORMAL HIGH (ref 1.7–7.7)
Neutrophils Relative %: 86 %
Platelets: 505 10*3/uL — ABNORMAL HIGH (ref 150–400)
RBC: 2.68 MIL/uL — ABNORMAL LOW (ref 3.87–5.11)
RDW: 14.6 % (ref 11.5–15.5)
WBC: 9.8 10*3/uL (ref 4.0–10.5)
nRBC: 0.2 % (ref 0.0–0.2)

## 2019-11-02 LAB — GLUCOSE, CAPILLARY
Glucose-Capillary: 119 mg/dL — ABNORMAL HIGH (ref 70–99)
Glucose-Capillary: 129 mg/dL — ABNORMAL HIGH (ref 70–99)
Glucose-Capillary: 132 mg/dL — ABNORMAL HIGH (ref 70–99)
Glucose-Capillary: 136 mg/dL — ABNORMAL HIGH (ref 70–99)
Glucose-Capillary: 140 mg/dL — ABNORMAL HIGH (ref 70–99)
Glucose-Capillary: 150 mg/dL — ABNORMAL HIGH (ref 70–99)
Glucose-Capillary: 152 mg/dL — ABNORMAL HIGH (ref 70–99)

## 2019-11-02 LAB — PROCALCITONIN: Procalcitonin: 1.01 ng/mL

## 2019-11-02 NOTE — Progress Notes (Signed)
Patient and family declined having Pleurx drained. Daughter specifically stated that family prefers to wait until Monday to have NP or MD drain it.  RN continuing to monitor patient pain level and vital signs.

## 2019-11-02 NOTE — Progress Notes (Signed)
PROGRESS NOTE    RUMOR SUN  VVO:[id number] DOB: [birth year] DOA: 10/16/2019 PCP: Darreld Mclean, MD   Chief Complaint  Patient presents with  . Shortness of Breath  . Chest Pain    Brief Narrative:  Patient is a 59 year old female with history of right breast cancer, hypertension, hyperlipidemia, type 2 diabetes mellitus, asthma who presents to the emergency department with complaints of central sharp chest pain as well as shortness of breath. She was recently admitted here and discharged 2 days prior to presentation when she underwent Pleurx catheter placement by PCCM after she was found to have large left pleural effusion secondary to her breast cancer. Patient did not know how to use the Pleurx catheter at home. On presentation, chest x-ray showed complete white out of the left lung but there was also suspicion for new consolidations on the right side concerning for pneumonia for which she was started on antibiotics.  Patient admitted for further management.   Assessment & Plan:   Active Problems:   Hypertension   History of tobacco use   Hypercholesterolemia   Malignant neoplasm of overlapping sites of right breast in female, estrogen receptor positive ([place])   Encounter for central line placement   Malignant pleural effusion   Pleural effusion on left   Metastases to the liver [facility])   Acute respiratory failure (HCC)   Diabetes mellitus type 2, uncontrolled, with complications (HCC)   Acute respiratory failure with hypoxia (HCC)   Pneumonia of right lower lobe due to infectious organism   Generalized pain   General weakness   Palliative care by specialist   Cancer associated pain   Therapeutic opioid induced constipation   Acute on chronic respiratory failure with hypoxia likely 2/2 left-sided extensivemalignantpleural effusion On 2 L of oxygen at home at baseline Chest tube clamped per PCCM, resume 2x weekly drainage - has outpatient pulm f/u 7/8 at 0930 with  Geraldo Pitter  - they've reviewed drainage with pt Pulm suspects element of trapped lung, won't be candidate for pleurodesis due to this On 6/22 only 50 cc out - repeat CXR 6/23 showed unchanged large left pleural effusion with collapse of the majority of the left lung Spoke to [place] RN from PCCM on 10/30/19 about pleurx drainage and care, saw pt, pt refusing to have pleurx drained due to anticipated pain/anxiety  Spoke to Dr Chase Caller on 11/01/19 about plans for pleurx management, will follow up on the best option as this is a very difficult case Oncology following, appreciate recommendations Palliative care following for pain control, appreciate assistance  Malignantneoplasm of the right breast [name] is following. Capecitabine started on 10/28/2019  ?Possible HCAP Afebrile, with improving leukocytosis Previous blood cultures no growth till date, repeat BC NGTD Chest x-ray as mentioned above, no pneumonia noted UA neg for infection, UC with multiple species  Procalcitonin downtrending, will trend Continue empiric IV cefepime Daily CBC  Anemia of chronic disease Baseline hemoglobin between 8-10 Anemia panel pending Daily CBC  Hypertension Blood pressure stable at this time Continue atenolol, monitor closely  Diabetes mellitus A1c of 7.8. Continue sliding scaleinsulin for now.She is not on any medications at home. Consider Metformin 500 mg twice a day on discharge  Goals of care Poor prognosis Discussed extensively with daughter about goals of care, wants to continue current treatment plan Very difficult situation, as patient is very weak/deconditioned, refusing even to attempt to drain/flush Pleurx, spends most time sleepy/lethargic in bed.  Daughter stating the plan to take  her home with palliative Palliative care consulted for pain management, goals of care   DVT prophylaxis: (lovenox Code Status: full  Family Communication: Discussed extensively with daughter  at bedside on 11/01/2019 Disposition:   Status is: Inpatient  Remains inpatient appropriate because:Inpatient level of care appropriate due to severity of illness   Dispo: The patient is from: Home              Anticipated d/c is to: Home              Anticipated d/c date is: 2 days              Patient currently is not medically stable to d/c.   Consultants:   PCCM  Pulm  Oncology  Palliative  Procedures:  None  Antimicrobials: Anti-infectives (From admission, onward)   Start     Dose/Rate Route Frequency Ordered Stop   10/31/19 0900  ceFEPIme (MAXIPIME) 2 g in sodium chloride 0.9 % 100 mL IVPB     Discontinue     2 g 200 mL/hr over 30 Minutes Intravenous Every 8 hours 10/31/19 0810     10/16/19 1400  ceFEPIme (MAXIPIME) 2 g in sodium chloride 0.9 % 100 mL IVPB  Status:  Discontinued        2 g 200 mL/hr over 30 Minutes Intravenous Every 8 hours 10/16/19 0817 10/17/19 1426   10/16/19 0530  vancomycin (VANCOCIN) IVPB 1000 mg/200 mL premix        1,000 mg 200 mL/hr over 60 Minutes Intravenous  Once 10/16/19 0520 10/16/19 0741   10/16/19 0530  ceFEPIme (MAXIPIME) 2 g in sodium chloride 0.9 % 100 mL IVPB        2 g 200 mL/hr over 30 Minutes Intravenous  Once 10/16/19 0520 10/16/19 0741      Subjective: Patient continues to be deconditioned, weak, denies any new complaints, except for diarrhea.  Daughter at bedside, stated patient able to eat a little bit.  Denies any worsening shortness of breath, chest pain.   Objective: Vitals:   11/01/19 2019 11/02/19 0516 11/02/19 1033 11/02/19 1343  BP: 112/78 117/71  108/73  Pulse: 87 87  84  Resp: 16 18  12   Temp: 98.3 F (36.8 C) 98.4 F (36.9 C)  97.9 F (36.6 C)  TempSrc: Oral Oral  Oral  SpO2: 100% 100% 97% 100%  Weight:      Height:        Intake/Output Summary (Last 24 hours) at 11/02/2019 1513 Last data filed at 11/02/2019 1100 Gross per 24 hour  Intake 10 ml  Output --  Net 10 ml   Filed Weights    10/16/19 0431 10/27/19 1854 10/28/19 0406  Weight: 80.3 kg 83.1 kg 81.3 kg    Examination:  General: NAD, lethargic, very deconditioned  Cardiovascular: S1, S2 present  Respiratory:  Diminished breath sounds on the left, with noted Pleurx drain  Abdomen: Soft, nontender, nondistended, bowel sounds present  Musculoskeletal: No bilateral pedal edema noted  Skin: Normal  Psychiatry:  Poor mood    Data Reviewed: I have personally reviewed following labs and imaging studies  CBC: Recent Labs  Lab 10/29/19 0644 10/30/19 0531 10/31/19 0546 11/01/19 0545 11/02/19 0603  WBC 15.2* 15.9* 13.9* 11.1* 9.8  NEUTROABS 13.3* 13.9* 12.2* 9.9* 8.5*  HGB 8.2* 8.2* 8.0* 8.3* 7.6*  HCT 25.1* 25.7* 24.5* 26.1* 23.3*  MCV 87.5 87.7 86.9 88.5 86.9  PLT 504* 594* 538* 514* 505*    Basic Metabolic  Panel: Recent Labs  Lab 10/27/19 0500 10/27/19 0500 10/28/19 0517 10/28/19 0517 10/29/19 0644 10/30/19 0531 10/31/19 0546 11/01/19 0545 11/02/19 0603  NA 136   < > 138   < > 138 137 135 139 140  K 4.3   < > 4.4   < > 4.5 4.8 5.1 4.8 4.1  CL 100   < > 102   < > 99 100 98 102 106  CO2 22   < > 22   < > 22 21* 23 22 22   GLUCOSE 154*   < > 139*   < > 135* 127* 134* 131* 138*  BUN 20   < > 24*   < > 35* 39* 46* 45* 39*  CREATININE 0.69   < > 0.79   < > 1.02* 1.00 1.02* 0.83 0.74  CALCIUM 9.3   < > 9.5   < > 9.4 9.4 9.4 9.7 9.2  MG 1.7  --  1.7  --  1.9  --   --   --   --   PHOS 4.8*  --  4.8*  --  4.6  --   --   --   --    < > = values in this interval not displayed.    GFR: Estimated Creatinine Clearance: 83.4 mL/min (by C-G formula based on SCr of 0.74 mg/dL).  Liver Function Tests: Recent Labs  Lab 10/29/19 0644 10/30/19 0531 10/31/19 0546 11/01/19 0545 11/02/19 0603  AST 46* 62* 67* 52* 45*  ALT 21 21 24 22 19   ALKPHOS 76 85 94 94 92  BILITOT 0.6 1.0 0.7 0.6 0.5  PROT 6.5 6.8 6.9 6.8 6.4*  ALBUMIN 2.3* 2.3* 2.2* 2.3* 2.0*    CBG: Recent Labs  Lab 11/01/19 2013  11/02/19 0019 11/02/19 0511 11/02/19 0718 11/02/19 1223  GLUCAP 132* 140* 136* 132* 150*     Recent Results (from the past 240 hour(s))  Urine Culture     Status: Abnormal   Collection Time: 10/31/19  5:37 AM   Specimen: Urine, Clean Catch  Result Value Ref Range Status   Specimen Description   Final    URINE, CLEAN CATCH Performed at [facility], [place] [address], [place], [place] [address]    Special Requests   Final    NONE Performed at [facility], [place] [address], [place], [place] [address]    Culture (A)  Final    60,000 COLONIES/mL MULTIPLE SPECIES PRESENT, SUGGEST RECOLLECTION   Report Status 11/01/2019 FINAL  Final  Culture, blood (routine x 2)     Status: None (Preliminary result)   Collection Time: 10/31/19  5:41 AM   Specimen: BLOOD  Result Value Ref Range Status   Specimen Description   Final    BLOOD LEFT ARM Performed at [place] [address], [place], [place] [address]    Special Requests   Final    BOTTLES DRAWN AEROBIC ONLY Blood Culture results may not be optimal due to an inadequate volume of blood received in culture bottles Performed at [place] [address] [place], [place] [address]    Culture   Final    NO GROWTH 2 DAYS Performed at [place] [address], [place], [place] [address]    Report Status PENDING  Incomplete  Culture, blood (routine x 2)     Status: None (Preliminary result)   Collection Time: 10/31/19  5:46 AM   Specimen:  BLOOD LEFT HAND  Result Value Ref Range Status   Specimen Description   Final    BLOOD LEFT HAND Performed at [place] [address] [place], [place] [address]    Special Requests   Final    BOTTLES DRAWN AEROBIC ONLY Blood Culture results may not be optimal due to an inadequate volume of blood received in culture bottles Performed at [place] [address] [place], [place] [address]    Culture   Final    NO GROWTH 2 DAYS Performed at [place] [address], [place],  [address]    Report Status PENDING  Incomplete         Radiology Studies: No results found.      Scheduled Meds: . acetaminophen  500 mg Oral TID WC  . atenolol  100 mg Oral Daily  . capecitabine  1,500 mg Oral BID PC  . pulmozyme (DORNASE) for intrapleural administration  5 mg Intrapleural Once  . enoxaparin (LOVENOX) injection  40 mg Subcutaneous Q24H  . feeding supplement (ENSURE ENLIVE)  237 mL Oral TID BM  . fentaNYL  1 patch Transdermal Q72H  . fentaNYL  1 patch Transdermal Q72H  . hydrocortisone cream   Topical BID  . insulin aspart  0-15 Units Subcutaneous Q4H  . levalbuterol  0.63 mg Nebulization TID  . mouth rinse  15 mL Mouth Rinse BID  . nystatin  5 mL Oral QID  . polyethylene glycol  17 g Oral BID  . promethazine  12.5 mg Oral Q6H  . senna  2 tablet Oral BID  . sucralfate  1 g Oral TID WC & HS  . venlafaxine XR  75 mg Oral Q breakfast   Continuous Infusions: . ceFEPime (MAXIPIME) IV 2 g (11/02/19 1018)     LOS: 17 days     Alma Friendly, MD [facility]  11/02/2019, 3:13 PM

## 2019-11-02 NOTE — Progress Notes (Signed)
Received report from RN, agree with previous assessment, will continue POC

## 2019-11-03 DIAGNOSIS — J9600 Acute respiratory failure, unspecified whether with hypoxia or hypercapnia: Secondary | ICD-10-CM

## 2019-11-03 LAB — COMPREHENSIVE METABOLIC PANEL
ALT: 18 U/L (ref 0–44)
AST: 39 U/L (ref 15–41)
Albumin: 2.2 g/dL — ABNORMAL LOW (ref 3.5–5.0)
Alkaline Phosphatase: 92 U/L (ref 38–126)
Anion gap: 9 (ref 5–15)
BUN: 43 mg/dL — ABNORMAL HIGH (ref 6–20)
CO2: 24 mmol/L (ref 22–32)
Calcium: 9.4 mg/dL (ref 8.9–10.3)
Chloride: 107 mmol/L (ref 98–111)
Creatinine, Ser: 0.93 mg/dL (ref 0.44–1.00)
GFR calc Af Amer: >60 mL/min (ref >60)
GFR calc non Af Amer: >60 mL/min (ref >60)
Glucose, Bld: 125 mg/dL — ABNORMAL HIGH (ref 70–99)
Potassium: 4.3 mmol/L (ref 3.5–5.1)
Sodium: 140 mmol/L (ref 135–145)
Total Bilirubin: 0.7 mg/dL (ref 0.3–1.2)
Total Protein: 6.6 g/dL (ref 6.5–8.1)

## 2019-11-03 LAB — TYPE AND SCREEN
ABO/RH(D): O POS
Antibody Screen: NEGATIVE

## 2019-11-03 LAB — GLUCOSE, CAPILLARY
Glucose-Capillary: 126 mg/dL — ABNORMAL HIGH (ref 70–99)
Glucose-Capillary: 130 mg/dL — ABNORMAL HIGH (ref 70–99)
Glucose-Capillary: 136 mg/dL — ABNORMAL HIGH (ref 70–99)
Glucose-Capillary: 136 mg/dL — ABNORMAL HIGH (ref 70–99)
Glucose-Capillary: 143 mg/dL — ABNORMAL HIGH (ref 70–99)

## 2019-11-03 LAB — CBC WITH DIFFERENTIAL/PLATELET
Abs Immature Granulocytes: 0.33 10*3/uL — ABNORMAL HIGH (ref 0.00–0.07)
Basophils Absolute: 0 10*3/uL (ref 0.0–0.1)
Basophils Relative: 0 %
Eosinophils Absolute: 0.1 10*3/uL (ref 0.0–0.5)
Eosinophils Relative: 1 %
HCT: 23.7 % — ABNORMAL LOW (ref 36.0–46.0)
Hemoglobin: 7.7 g/dL — ABNORMAL LOW (ref 12.0–15.0)
Immature Granulocytes: 3 %
Lymphocytes Relative: 9 %
Lymphs Abs: 0.8 10*3/uL (ref 0.7–4.0)
MCH: 28.2 pg (ref 26.0–34.0)
MCHC: 32.5 g/dL (ref 30.0–36.0)
MCV: 86.8 fL (ref 80.0–100.0)
Monocytes Absolute: 0.5 10*3/uL (ref 0.1–1.0)
Monocytes Relative: 6 %
Neutro Abs: 7.9 10*3/uL — ABNORMAL HIGH (ref 1.7–7.7)
Neutrophils Relative %: 81 %
Platelets: 516 10*3/uL — ABNORMAL HIGH (ref 150–400)
RBC: 2.73 MIL/uL — ABNORMAL LOW (ref 3.87–5.11)
RDW: 14.7 % (ref 11.5–15.5)
WBC: 9.7 10*3/uL (ref 4.0–10.5)
nRBC: 0 % (ref 0.0–0.2)

## 2019-11-03 LAB — IRON AND TIBC
Iron: 34 ug/dL (ref 28–170)
Saturation Ratios: 22 % (ref 10.4–31.8)
TIBC: 153 ug/dL — ABNORMAL LOW (ref 250–450)
UIBC: 119 ug/dL

## 2019-11-03 LAB — FOLATE: Folate: 6.6 ng/mL (ref >5.9)

## 2019-11-03 LAB — ABO/RH: ABO/RH(D): O POS

## 2019-11-03 LAB — FERRITIN: Ferritin: 1284 ng/mL — ABNORMAL HIGH (ref 11–307)

## 2019-11-03 LAB — VITAMIN B12: Vitamin B-12: 1161 pg/mL — ABNORMAL HIGH (ref 180–914)

## 2019-11-03 MED ORDER — TRAMADOL HCL 50 MG PO TABS
75.0000 mg | ORAL_TABLET | Freq: Four times a day (QID) | ORAL | Status: DC | PRN
  Administered 2019-11-03 – 2019-11-11 (×11): 75 mg via ORAL
  Filled 2019-11-03 (×15): qty 2

## 2019-11-03 MED ORDER — PRO-STAT SUGAR FREE PO LIQD
30.0000 mL | Freq: Two times a day (BID) | ORAL | Status: DC
  Administered 2019-11-03 – 2019-11-10 (×7): 30 mL via ORAL
  Filled 2019-11-03 (×18): qty 30

## 2019-11-03 NOTE — Progress Notes (Signed)
Nutrition Follow-up  DOCUMENTATION CODES:   Not applicable  INTERVENTION:  -45m Pro-stat BID, each supplement provides 100 kcal and 15 grams of protein -Continue Ensure Enlive TID -Continue snacks TID -If not contraindicated, would recommend trial of appetite stimulant   NUTRITION DIAGNOSIS:   Inadequate oral intake related to poor appetite as evidenced by meal completion < 50%.  Ongoing.  GOAL:   Patient will meet greater than or equal to 90% of their needs  Not met.  MONITOR:   PO intake, Supplement acceptance, Weight trends, Labs, I & O's  REASON FOR ASSESSMENT:   Consult Assessment of nutrition requirement/status  ASSESSMENT:   Pt presented with opacification of L hemithorax w/ recent diagnosis of L malignant pleural effusion 2/2 breast cancer. PMH includes breast cancer, HTN, HLD, T2DM, asthma.  Pt unavailable at time of RD visit. Per Palliative Care, pt reports being able to eat small amounts of fruit and drink smoothies. Recommend adding ensure/protein to smoothie mixture.   PO intake: 0-25% x last 8 recorded meals (11% average meal intake)  Labs: CBGs 119-130 Medications: Ensure Enlive TID, Novolog, Miralax, Senokot, Phenergan  Diet Order:   Diet Order            Diet regular Room service appropriate? Yes; Fluid consistency: Thin  Diet effective now                 EDUCATION NEEDS:   Education needs have been addressed  Skin:  Skin Assessment: Reviewed RN Assessment  Last BM:  6/27 type 6  Height:   Ht Readings from Last 1 Encounters:  10/16/19 5' 6"  (1.676 m)    Weight:   Wt Readings from Last 1 Encounters:  10/28/19 81.3 kg   BMI:  Body mass index is 28.94 kg/m.  Estimated Nutritional Needs:   Kcal:  1950-2150  Protein:  95-110 grams  Fluid:  >1.95L    ALarkin Ina MS, RD, LDN RD pager number and weekend/on-call pager number located in [phone]

## 2019-11-03 NOTE — Progress Notes (Signed)
PROGRESS NOTE    Autumn Cook  OIZ:[id number] DOB: Nov 13, 1960 DOA: 10/16/2019 PCP: Darreld Mclean, MD   Chief Complaint  Patient presents with  . Shortness of Breath  . Chest Pain    Brief Narrative:  Patient is a 59 year old female with history of right breast cancer, hypertension, hyperlipidemia, type 2 diabetes mellitus, asthma who presents to the emergency department with complaints of central sharp chest pain as well as shortness of breath. She was recently admitted here and discharged 2 days prior to presentation when she underwent Pleurx catheter placement by PCCM after she was found to have large left pleural effusion secondary to her breast cancer. Patient did not know how to use the Pleurx catheter at home. On presentation, chest x-ray showed complete white out of the left lung but there was also suspicion for new consolidations on the right side concerning for pneumonia for which she was started on antibiotics.  Patient admitted for further management.   Assessment & Plan:   Active Problems:   Hypertension   History of tobacco use   Hypercholesterolemia   Malignant neoplasm of overlapping sites of right breast in female, estrogen receptor positive ([place])   Encounter for central line placement   Malignant pleural effusion   Pleural effusion on left   Metastases to the liver [facility])   Acute respiratory failure (HCC)   Diabetes mellitus type 2, uncontrolled, with complications (HCC)   Acute respiratory failure with hypoxia (HCC)   Pneumonia of right lower lobe due to infectious organism   Generalized pain   General weakness   Palliative care by specialist   Cancer associated pain   Therapeutic opioid induced constipation   Acute on chronic respiratory failure with hypoxia likely 2/2 left-sided extensivemalignantpleural effusion On 2 L of oxygen at home at baseline Chest tube clamped per PCCM, resume 2x weekly drainage - has outpatient pulm f/u 7/8 at 0930 with  Geraldo Pitter  - they've reviewed drainage with pt Pulm suspects element of trapped lung, won't be candidate for pleurodesis due to this On 6/22 only 50 cc out - repeat CXR 6/23 showed unchanged large left pleural effusion with collapse of the majority of the left lung Spoke to [place] RN from PCCM on 10/30/19 about pleurx drainage and care, saw pt, pt refusing to have pleurx drained due to anticipated pain/anxiety  Spoke to Dr Chase Caller on 11/01/19 about plans for pleurx management, will follow up on the best option as this is a very difficult case Spoke to Dr [place] on 11/03/19, will see pt today and re-evaluate Oncology following, appreciate recommendations Palliative care following for pain control, appreciate assistance  Malignantneoplasm of the right breast [name] is following. Capecitabine started on 10/28/2019  ?Possible HCAP Afebrile, with improving leukocytosis Previous blood cultures no growth till date, repeat BC NGTD Chest x-ray as mentioned above, no pneumonia noted UA neg for infection, UC with multiple species  Procalcitonin downtrending, will trend Continue empiric IV cefepime Daily CBC  Anemia of chronic disease Baseline hemoglobin between 8-10 Anemia panel pending Daily CBC  Hypertension Blood pressure stable at this time Continue atenolol, monitor closely  Diabetes mellitus A1c of 7.8. Continue sliding scaleinsulin for now.She is not on any medications at home. Consider Metformin 500 mg twice a day on discharge  Goals of care Poor prognosis Discussed extensively with daughter about goals of care, wants to continue current treatment plan Very difficult situation, as patient is very weak/deconditioned, refusing even to attempt to drain/flush Pleurx, spends  most time sleepy/lethargic in bed.  Daughter stating the plan to take her home with palliative Palliative care consulted for pain management, goals of care   DVT prophylaxis: (lovenox Code  Status: full  Family Communication: Discussed extensively with daughter over the phone on  11/03/2019 Disposition:   Status is: Inpatient  Remains inpatient appropriate because:Inpatient level of care appropriate due to severity of illness   Dispo: The patient is from: Home              Anticipated d/c is to: Home              Anticipated d/c date is: 2 days              Patient currently is not medically stable to d/c.   Consultants:   PCCM  Pulm  Oncology  Palliative  Procedures:  None  Antimicrobials: Anti-infectives (From admission, onward)   Start     Dose/Rate Route Frequency Ordered Stop   10/31/19 0900  ceFEPIme (MAXIPIME) 2 g in sodium chloride 0.9 % 100 mL IVPB     Discontinue     2 g 200 mL/hr over 30 Minutes Intravenous Every 8 hours 10/31/19 0810     10/16/19 1400  ceFEPIme (MAXIPIME) 2 g in sodium chloride 0.9 % 100 mL IVPB  Status:  Discontinued        2 g 200 mL/hr over 30 Minutes Intravenous Every 8 hours 10/16/19 0817 10/17/19 1426   10/16/19 0530  vancomycin (VANCOCIN) IVPB 1000 mg/200 mL premix        1,000 mg 200 mL/hr over 60 Minutes Intravenous  Once 10/16/19 0520 10/16/19 0741   10/16/19 0530  ceFEPIme (MAXIPIME) 2 g in sodium chloride 0.9 % 100 mL IVPB        2 g 200 mL/hr over 30 Minutes Intravenous  Once 10/16/19 0520 10/16/19 0741      Subjective: Patient continues to be deconditioned, intermittent complaints of generalized pain. Denies any worsening shortness of breath, chest pain, abdominal pain, nausea/vomiting. Having BMs.   Objective: Vitals:   11/02/19 1343 11/02/19 2104 11/02/19 2138 11/03/19 0630  BP: 108/73 123/76  112/82  Pulse: 84 82  88  Resp: 12 20  18   Temp: 97.9 F (36.6 C) 97.9 F (36.6 C)  98.3 F (36.8 C)  TempSrc: Oral Oral  Oral  SpO2: 100% 100% 99% 99%  Weight:      Height:        Intake/Output Summary (Last 24 hours) at 11/03/2019 1424 Last data filed at 11/03/2019 0640 Gross per 24 hour  Intake 720 ml    Output 250 ml  Net 470 ml   Filed Weights   10/16/19 0431 10/27/19 1854 10/28/19 0406  Weight: 80.3 kg 83.1 kg 81.3 kg    Examination:  General: NAD, lethargic, very deconditioned  Cardiovascular: S1, S2 present  Respiratory:  Diminished breath sounds on the left, with noted Pleurx drain  Abdomen: Soft, nontender, nondistended, bowel sounds present  Musculoskeletal: No bilateral pedal edema noted  Skin: Normal  Psychiatry:  Poor mood    Data Reviewed: I have personally reviewed following labs and imaging studies  CBC: Recent Labs  Lab 10/30/19 0531 10/31/19 0546 11/01/19 0545 11/02/19 0603 11/03/19 0605  WBC 15.9* 13.9* 11.1* 9.8 9.7  NEUTROABS 13.9* 12.2* 9.9* 8.5* 7.9*  HGB 8.2* 8.0* 8.3* 7.6* 7.7*  HCT 25.7* 24.5* 26.1* 23.3* 23.7*  MCV 87.7 86.9 88.5 86.9 86.8  PLT 594* 538* 514* 505* 516*  Basic Metabolic Panel: Recent Labs  Lab 10/28/19 0517 10/28/19 0517 10/29/19 2035 10/29/19 0644 10/30/19 0531 10/31/19 0546 11/01/19 0545 11/02/19 0603 11/03/19 0605  NA 138   < > 138   < > 137 135 139 140 140  K 4.4   < > 4.5   < > 4.8 5.1 4.8 4.1 4.3  CL 102   < > 99   < > 100 98 102 106 107  CO2 22   < > 22   < > 21* 23 22 22 24   GLUCOSE 139*   < > 135*   < > 127* 134* 131* 138* 125*  BUN 24*   < > 35*   < > 39* 46* 45* 39* 43*  CREATININE 0.79   < > 1.02*   < > 1.00 1.02* 0.83 0.74 0.93  CALCIUM 9.5   < > 9.4   < > 9.4 9.4 9.7 9.2 9.4  MG 1.7  --  1.9  --   --   --   --   --   --   PHOS 4.8*  --  4.6  --   --   --   --   --   --    < > = values in this interval not displayed.    GFR: Estimated Creatinine Clearance: 71.8 mL/min (by C-G formula based on SCr of 0.93 mg/dL).  Liver Function Tests: Recent Labs  Lab 10/30/19 0531 10/31/19 0546 11/01/19 0545 11/02/19 0603 11/03/19 0605  AST 62* 67* 52* 45* 39  ALT 21 24 22 19 18   ALKPHOS 85 94 94 92 92  BILITOT 1.0 0.7 0.6 0.5 0.7  PROT 6.8 6.9 6.8 6.4* 6.6  ALBUMIN 2.3* 2.2* 2.3* 2.0* 2.2*     CBG: Recent Labs  Lab 11/02/19 2057 11/02/19 2339 11/03/19 0333 11/03/19 0729 11/03/19 1135  GLUCAP 119* 129* 126* 130* 136*     Recent Results (from the past 240 hour(s))  Urine Culture     Status: Abnormal   Collection Time: 10/31/19  5:37 AM   Specimen: Urine, Clean Catch  Result Value Ref Range Status   Specimen Description   Final    URINE, CLEAN CATCH Performed at [facility], [place] [address] [place], [place] [address]    Special Requests   Final    NONE Performed at [facility], [place] [address] [place], [place] [address]    Culture (A)  Final    60,000 COLONIES/mL MULTIPLE SPECIES PRESENT, SUGGEST RECOLLECTION   Report Status 11/01/2019 FINAL  Final  Culture, blood (routine x 2)     Status: None (Preliminary result)   Collection Time: 10/31/19  5:41 AM   Specimen: BLOOD  Result Value Ref Range Status   Specimen Description   Final    BLOOD LEFT ARM Performed at [place] [address], [place], [place] [address]    Special Requests   Final    BOTTLES DRAWN AEROBIC ONLY Blood Culture results may not be optimal due to an inadequate volume of blood received in culture bottles Performed at [place] [address], [place], [place] [address]    Culture   Final    NO GROWTH 3 DAYS Performed at [facility], [place] [address], [place], [place] [address]    Report Status PENDING  Incomplete  Culture, blood (routine x 2)     Status: None (Preliminary result)   Collection Time: 10/31/19  5:46 AM   Specimen: BLOOD LEFT HAND  Result Value Ref Range Status   Specimen Description   Final    BLOOD LEFT HAND Performed at [place] [address], [place], [place] [address]    Special Requests   Final    BOTTLES DRAWN AEROBIC ONLY Blood Culture results may not be optimal due to an inadequate volume of blood received in culture bottles Performed at [place] [address], [place], [place] [address]    Culture   Final    NO GROWTH 3 DAYS Performed at [facility], [place] [address] [place], [place] [address]    Report Status PENDING  Incomplete         Radiology Studies: No results found.      Scheduled Meds: . acetaminophen  500 mg Oral TID WC  . atenolol  100 mg Oral Daily  . capecitabine  1,500 mg Oral BID PC  . enoxaparin (LOVENOX) injection  40 mg Subcutaneous Q24H  . feeding supplement (ENSURE ENLIVE)  237 mL Oral TID BM  . feeding supplement (PRO-STAT SUGAR FREE 64)  30 mL Oral BID  . fentaNYL  1 patch Transdermal Q72H  . fentaNYL  1 patch Transdermal Q72H  . hydrocortisone cream   Topical BID  . insulin aspart  0-15 Units Subcutaneous Q4H  . levalbuterol  0.63 mg Nebulization TID  . mouth rinse  15 mL Mouth Rinse BID  . nystatin  5 mL Oral QID  . polyethylene glycol  17 g Oral BID  . promethazine  12.5 mg Oral Q6H  . senna  2 tablet Oral BID  . sucralfate  1 g Oral TID WC & HS  . venlafaxine XR  75 mg Oral Q breakfast   Continuous Infusions: . ceFEPime (MAXIPIME) IV 2 g (11/03/19 0900)     LOS: 18 days     Alma Friendly, MD [facility]  11/03/2019, 2:24 PM

## 2019-11-03 NOTE — Progress Notes (Signed)
Daily Progress Note   Patient Name: Autumn Cook       Date: 11/03/2019 DOB: 04-30-1961  Age: 59 y.o. MRN#: [id number] Attending Physician: Alma Friendly, MD Primary Care Physician: Darreld Mclean, MD Admit Date: 10/16/2019  Reason for Consultation/Follow-up: Non pain symptom management and Pain control  Subjective: Autumn Cook is resting in bed, daughter at bedside. She is awake, alert, and able to participate in conversation. She is able to eat small amounts of fruit and drink juice smoothies. She denies having any shortness of breath. She describes experiencing "extreme" pain sometimes when she is up moving around - the last episode was this morning when she got up to the bathroom and got a bath - she received IV morphine which helped her. Autumn Cook had a bowel movement while she was up this morning. Pain management plan was discussed - she does not want to increase her IV morphine because she states it makes her too drowsy. She and her daughter were in agreement to try increasing the tramadol today for incident pain management.  Med history noted - patient receiving PRN morphine and tramadol.    Length of Stay: 18  Current Medications: Scheduled Meds:  . acetaminophen  500 mg Oral TID WC  . atenolol  100 mg Oral Daily  . capecitabine  1,500 mg Oral BID PC  . pulmozyme (DORNASE) for intrapleural administration  5 mg Intrapleural Once  . enoxaparin (LOVENOX) injection  40 mg Subcutaneous Q24H  . feeding supplement (ENSURE ENLIVE)  237 mL Oral TID BM  . fentaNYL  1 patch Transdermal Q72H  . fentaNYL  1 patch Transdermal Q72H  . hydrocortisone cream   Topical BID  . insulin aspart  0-15 Units Subcutaneous Q4H  . levalbuterol  0.63 mg Nebulization TID  . mouth rinse  15 mL  Mouth Rinse BID  . nystatin  5 mL Oral QID  . polyethylene glycol  17 g Oral BID  . promethazine  12.5 mg Oral Q6H  . senna  2 tablet Oral BID  . sucralfate  1 g Oral TID WC & HS  . venlafaxine XR  75 mg Oral Q breakfast   PRN Meds: ipratropium-albuterol, LORazepam, metoprolol tartrate, morphine injection, prochlorperazine **OR** prochlorperazine, sorbitol, traMADol  Physical Exam     General: Awake,  in  no acute distress.  Heart: Regular rate and rhythm.  Lungs: Decreased Abdomen: Soft, non distended.  Ext: No significant edema Skin: Warm and dry      Vital Signs: BP 112/82 (BP Location: Left Arm)   Pulse 88   Temp 98.3 F (36.8 C) (Oral)   Resp 18   Ht 5\' 6"  (1.676 m)   Wt 81.3 kg   LMP 11/14/2013   SpO2 99%   BMI 28.94 kg/m  SpO2: SpO2: 99 % O2 Device: O2 Device: Nasal Cannula O2 Flow Rate: O2 Flow Rate (L/min): 2 L/min  Intake/output summary:   Intake/Output Summary (Last 24 hours) at 11/03/2019 1101 Last data filed at 11/03/2019 5956 Gross per 24 hour  Intake 720 ml  Output 250 ml  Net 470 ml   LBM: Last BM Date: 11/02/19 Baseline Weight: Weight: 80.3 kg Most recent weight: Weight: 81.3 kg       Palliative Assessment/Data: Flowsheet Rows     Most Recent Value  Intake Tab  Referral Department Hospitalist  Unit at Time of Referral Other (Comment)  [urology/telementry]  Palliative Care Primary Diagnosis Cancer  Date Notified 10/20/19  Palliative Care Type New Palliative care  Reason for referral Pain, Non-pain Symptom  Date of Admission 10/16/19  Date first seen by Palliative Care 10/20/19  # of days Palliative referral response time 0 Day(s)  # of days IP prior to Palliative referral 4  Clinical Assessment  Psychosocial & Spiritual Assessment  Palliative Care Outcomes     Patient Active Problem List   Diagnosis Date Noted  . Cancer associated pain   . Therapeutic opioid induced constipation   . Generalized pain   . General weakness   .  Palliative care by specialist   . Acute respiratory failure ([place]) 10/16/2019  . Diabetes mellitus type 2, uncontrolled, with complications ([place]) 38/75/6433  . Acute respiratory failure with hypoxia ([place]) 10/16/2019  . Pneumonia of right lower lobe due to infectious organism 10/16/2019  . Metastases to the liver ([place]) 10/10/2019  . 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 Pleural effusion on left 10/09/2019  . Type 2 diabetes mellitus with hyperglycemia ([place]) 10/09/2019  . Encounter for central line placement 08/13/2018  . Port-A-Cath in place 08/07/2018  . Family history of lung cancer   . Malignant neoplasm of overlapping sites of right breast in female, estrogen receptor positive ([place]) 07/23/2018  . Achilles tendonitis, bilateral 06/11/2015  . Chest pain with moderate risk for cardiac etiology 10/13/2014  . Hypercholesterolemia 10/13/2014  . Idiopathic scoliosis 08/18/2014  . Hypertension 09/11/2012  . Overweight (BMI 25.0-29.9) 09/11/2012  . Migraine headache 09/11/2012  . Hx of thyroid nodule 09/11/2012  . History of tobacco use 09/11/2012    Palliative Care Assessment & Plan   Patient Profile: 59 year old female with metastatic breast cancer under the care of [name].  She is undergoing chemotherapy and radiation and is status post Pleurx catheter placement with cytology revealing malignant cells.  Currently admitted for discomfort and shortness of breath and pulmonary working to increase drainage from Pleurx catheter.  Plan on discharge she is for follow-up with [name] for further treatment with capecitabine.  Palliative following for pain management.  Recommendations/Plan: -Continue to optimize current medical treatment -Continue full code status -Tramadol increased to 75mg  PO q6h PRN for incident pain. May consider increasing fentanyl patch as well if needed.  -Encourage OOB and PO intake as tolerated -Patient had BM today - continue miralax and senna for opioid  related constipation -PMT  will continue to follow holistically  Goals of Care and Additional Recommendations:  Limitations on Scope of Treatment: Full Scope Treatment  Code Status:    Code Status Orders  (From admission, onward)         Start     Ordered   10/16/19 0803  Full code  Continuous        10/16/19 0808        Code Status History    Date Active Date Inactive Code Status Order ID Comments User Context   10/09/2019 2245 10/14/2019 2035 Full Code [id number]  Rise Patience, MD Inpatient   Advance Care Planning Activity       Prognosis:   Unable to determine  Discharge Planning:  Home with [facility] vs SNF for rehab  Care plan was discussed with primary RN, patient, daughter  Thank you for allowing the Palliative Medicine Team to assist in the care of this patient.    Total Time 25 minutes Prolonged Time Billed No    Greater than 50% of this time was spent counseling and coordinating care related to the above assessment and plan.   Amber Koren Bound, NP   Loistine Chance MD  Please contact Palliative Medicine Team phone at [phone] for questions and concerns.

## 2019-11-03 NOTE — Progress Notes (Signed)
PHARMACY NOTE -  cefepime  Pharmacy has been assisting with dosing of cefepime for HAP.  Dosage remains stable at 2g IV q8 hr and need for further dosage adjustment appears unlikely at present given stable renal function  Pharmacy will sign off, following peripherally for culture results or dose adjustments. Please reconsult if a change in clinical status warrants re-evaluation of dosage.  Reuel Boom, PharmD, BCPS [phone] 11/03/2019, 11:50 AM

## 2019-11-03 NOTE — Progress Notes (Signed)
Physical Therapy Treatment Patient Details Name: Autumn Cook MRN: [id number] DOB: 03/27/1961 Today's Date: 11/03/2019    History of Present Illness 59 yo female former smoker with metastatic breast cancer complicated by malignant pleural effusion.  She had pleurx catheter placed on 10/14/19    PT Comments    Pt lethargic, but painfree at rest so agreeable to attempt therapy; daughter in room encouraging. Pt tolerates sitting at EOB for 1 min 45 sec, but unable to scoot to EOB with feet on floor due to pain. Pt requiring min A with sitting up on bedside, increased time and heavy cues on sequencing to improve performance. Pt cued on ankle pumps while seated to improve dizziness, but unable to tolerate due to pain increase so returned to supine. Pt slightly turned to L sidelying with pillow behind back and reports "comfortable enough" and requests not to be repositioned with therapist assistance at this time. Daughter in room and educated to call nursing if pt's wants to be repositioned once nausea, dizziness and pain subside. Treatment limited due to pt's decreased activity tolerance; family requests therapy continues to see pt acutely to improve functional mobility independence. RN notified of session. Patient will benefit from continued physical therapy in hospital and recommendations below to increase strength, balance, endurance for safe ADLs and gait.   Follow Up Recommendations  Supervision for mobility/OOB;SNF;Supervision/Assistance - 24 hour     Equipment Recommendations  Rolling walker with 5" wheels;3in1 (PT);Wheelchair (measurements PT);Wheelchair cushion (measurements PT)    Recommendations for Other Services       Precautions / Restrictions Precautions Precautions: Fall Precaution Comments: monitor O2, pleurx catheter; dizzy in sitting Restrictions Weight Bearing Restrictions: No    Mobility  Bed Mobility Overal bed mobility: Needs Assistance Bed Mobility: Supine to  Sit;Sit to Supine  Supine to sit: Min assist;HOB elevated Sit to supine: Min guard;HOB elevated   General bed mobility comments: slow, labored movement with min A assisting BLE to EOB and to upright trunk to come to sitting at EOB, cues for hand placement and increased time; min G to return to supine, increased time with labored movement  Transfers  General transfer comment: unable 2* dizziness/pain in sitting  Ambulation/Gait  General Gait Details: unable 2* dizziness/pain in sitting   Stairs             Wheelchair Mobility    Modified Rankin (Stroke Patients Only)       Balance Overall balance assessment: Needs assistance Sitting-balance support: Feet unsupported;Bilateral upper extremity supported Sitting balance-Leahy Scale: Fair Sitting balance - Comments: seated EOB       Cognition Arousal/Alertness: Lethargic;Suspect due to medications Behavior During Therapy: Flat affect Overall Cognitive Status: Within Functional Limits for tasks assessed  General Comments: Arouses to voices, daughter in room encouraging pt with therapy.      Exercises General Exercises - Lower Extremity Ankle Circles/Pumps: AROM;Both;10 reps;Seated    General Comments General comments (skin integrity, edema, etc.): Pt tolerates sitting EOB 1 min 45 sec before returning to supine due to dizziness/pain; HR 70-80s, unable to check orthostatics.      Pertinent Vitals/Pain Pain Assessment: Faces Faces Pain Scale: Hurts little more Pain Location: L side/back with mobility Pain Descriptors / Indicators: Grimacing;Guarding;Stabbing Pain Intervention(s): Limited activity within patient's tolerance;Monitored during session;Premedicated before session    Home Living                      Prior Function  PT Goals (current goals can now be found in the care plan section) Acute Rehab PT Goals Patient Stated Goal: to go home, to be able to walk farther PT Goal  Formulation: With patient/family Time For Goal Achievement: 11/14/19 Potential to Achieve Goals: Good Progress towards PT goals: Progressing toward goals    Frequency    Min 3X/week      PT Plan Discharge plan needs to be updated    Co-evaluation              AM-PAC PT "6 Clicks" Mobility   Outcome Measure  Help needed turning from your back to your side while in a flat bed without using bedrails?: A Little Help needed moving from lying on your back to sitting on the side of a flat bed without using bedrails?: A Lot Help needed moving to and from a bed to a chair (including a wheelchair)?: Total Help needed standing up from a chair using your arms (e.g., wheelchair or bedside chair)?: Total Help needed to walk in hospital room?: Total Help needed climbing 3-5 steps with a railing? : Total 6 Click Score: 9    End of Session Equipment Utilized During Treatment: Oxygen Activity Tolerance: Patient limited by fatigue;Treatment limited secondary to medical complications (Comment);Patient limited by pain (dizziness) Patient left: in bed;with call bell/phone within reach;with family/visitor present;with bed alarm set Nurse Communication: Mobility status PT Visit Diagnosis: Pain;Dizziness and giddiness (R42);Difficulty in walking, not elsewhere classified (R26.2) Pain - Right/Left: Left     Time: 4235-3614 PT Time Calculation (min) (ACUTE ONLY): 18 min  Charges:  $Therapeutic Activity: 8-22 mins                      Tori Sarin [place] PT, DPT 11/03/19, 1:34 PM

## 2019-11-03 NOTE — Progress Notes (Signed)
[facility] pulmonary critical care medicine  Patient says that her breathing is not a problem but she continues to have pain with any movement on the left side.  She says the pain is a dull ache on the inside, occasionally stabbing in nature.  She does not feel a stinging sensation or burning near the site of her Pleurx. Chart reviewed, she has not allowed anyone to attempt drainage from the Pleurx catheter over the last several days.  Pulmonary critical care team presented last week to consider TPA and Pulmozyme infusion and the patient was not interested.  To briefly summarize her course, she was recently diagnosed with metastatic breast cancer, she was admitted on June 3 in the setting of increasing dyspnea.  Prior to her hospitalization she had a thoracentesis with 950 cc of fluid removed from the left chest.  After admission on June 3 she was evaluated by pulmonary and a Pleurx catheter was placed on October 13, 2019.  She had 1800 cc of fluid removed, chest x-ray did not show dramatic improvement.  She was discharged home on June 8, unfortunately home health never saw her so she returned to the hospital with concerns for worsening shortness of breath on June 10.  Catheter drainage was performed again then.  Pulmonary was consulted, minimal fluid was draining despite flushing of the catheter.  TPA and Pulmozyme were infused on June 15.  1.3 L drained over the ensuing 24 hours.  However since then there is been very little drainage.  She had 500 cc removed June 18 using a Vacutainer device with the Pleurx catheter.  However since then, she has not had much drainage.  She continues to complain of pain.  She has not allowed staff to manipulate the catheter or to use drainage since.  On exam: Anxious, some pain General awake, alert, complains of pain Pulmonary diminished on the left side no breath sounds, right side clear to auscultation Cardiovascular regular rate and rhythm no murmurs gallops rubs GI abdomen  soft nontender nondistended Neurologic awake alert, conversant with family  CT chest images independently reviewed showing a large left pleural effusion with Pleurx catheter in place, multiple chest x-ray shows a large left-sided pleural effusion.  The patient and her family describe significant pain with any fluid removal.  This is consistent with trapped lung, entire picture consistent with trapped lung.  We discussed various options for further fluid removal today but I explained to them that all of these very likely to be associated with some degree of pain because of lung entrapment.  The patient and her family's goals at this time are to minimize pain.  They understand that the fluid may continue to accumulate and cause further shortness of breath.  I offered a temporary chest tube placement here in the hospital in addition to the Pleurx with TPA instillation through that 1 to see if we could have better drainage but they are not interested in that option either.  As there primary goal is pain control I don't think there will be a viable invasive option.   At this time my recommendation is to remove the Pleurx catheter as they are no longer using it or have interest in using it.  I explained to them that I will come back in 48 hours to see how they feel about this.  > 35 minutes spent in consultation  Roselie Awkward, MD [facility] Pager: [phone] Cell: [phone] If no response, call [phone]

## 2019-11-04 LAB — CBC WITH DIFFERENTIAL/PLATELET
Abs Immature Granulocytes: 0.49 10*3/uL — ABNORMAL HIGH (ref 0.00–0.07)
Basophils Absolute: 0 10*3/uL (ref 0.0–0.1)
Basophils Relative: 0 %
Eosinophils Absolute: 0.1 10*3/uL (ref 0.0–0.5)
Eosinophils Relative: 1 %
HCT: 24 % — ABNORMAL LOW (ref 36.0–46.0)
Hemoglobin: 7.7 g/dL — ABNORMAL LOW (ref 12.0–15.0)
Immature Granulocytes: 6 %
Lymphocytes Relative: 10 %
Lymphs Abs: 0.8 10*3/uL (ref 0.7–4.0)
MCH: 28 pg (ref 26.0–34.0)
MCHC: 32.1 g/dL (ref 30.0–36.0)
MCV: 87.3 fL (ref 80.0–100.0)
Monocytes Absolute: 0.5 10*3/uL (ref 0.1–1.0)
Monocytes Relative: 5 %
Neutro Abs: 6.9 10*3/uL (ref 1.7–7.7)
Neutrophils Relative %: 78 %
Platelets: 506 10*3/uL — ABNORMAL HIGH (ref 150–400)
RBC: 2.75 MIL/uL — ABNORMAL LOW (ref 3.87–5.11)
RDW: 14.8 % (ref 11.5–15.5)
WBC: 8.8 10*3/uL (ref 4.0–10.5)
nRBC: 0 % (ref 0.0–0.2)

## 2019-11-04 LAB — GLUCOSE, CAPILLARY
Glucose-Capillary: 118 mg/dL — ABNORMAL HIGH (ref 70–99)
Glucose-Capillary: 130 mg/dL — ABNORMAL HIGH (ref 70–99)
Glucose-Capillary: 142 mg/dL — ABNORMAL HIGH (ref 70–99)
Glucose-Capillary: 150 mg/dL — ABNORMAL HIGH (ref 70–99)
Glucose-Capillary: 150 mg/dL — ABNORMAL HIGH (ref 70–99)

## 2019-11-04 MED ORDER — BIOTENE DRY MOUTH MT LIQD: 15.0000 mL | OROMUCOSAL | Status: DC | PRN

## 2019-11-04 MED ORDER — MAGIC MOUTHWASH W/LIDOCAINE
5.0000 mL | Freq: Four times a day (QID) | ORAL | Status: AC
  Administered 2019-11-05 – 2019-11-11 (×11): 5 mL via ORAL
  Filled 2019-11-04 (×28): qty 5

## 2019-11-04 NOTE — Progress Notes (Addendum)
PROGRESS NOTE    Autumn Cook  MIW:[id number] DOB: April 08, 1961 DOA: 10/16/2019 PCP: Darreld Mclean, MD   Chief Complaint  Patient presents with  . Shortness of Breath  . Chest Pain    Brief Narrative:  Patient is a 59 year old female with history of right breast cancer, hypertension, hyperlipidemia, type 2 diabetes mellitus, asthma who presents to the emergency department with complaints of central sharp chest pain as well as shortness of breath. She was recently admitted here and discharged 2 days prior to presentation when she underwent Pleurx catheter placement by PCCM after she was found to have large left pleural effusion secondary to her breast cancer. Patient did not know how to use the Pleurx catheter at home. On presentation, chest x-ray showed complete white out of the left lung but there was also suspicion for new consolidations on the right side concerning for pneumonia for which she was started on antibiotics.  Patient admitted for further management.   Assessment & Plan:   Active Problems:   Hypertension   History of tobacco use   Hypercholesterolemia   Malignant neoplasm of overlapping sites of right breast in female, estrogen receptor positive ([place])   Encounter for central line placement   Malignant pleural effusion   Pleural effusion on left   Metastases to the liver [facility])   Acute respiratory failure (HCC)   Diabetes mellitus type 2, uncontrolled, with complications (HCC)   Acute respiratory failure with hypoxia (HCC)   Pneumonia of right lower lobe due to infectious organism   Generalized pain   General weakness   Palliative care by specialist   Cancer associated pain   Therapeutic opioid induced constipation   Acute on chronic respiratory failure with hypoxia likely 2/2 left-sided extensivemalignantpleural effusion On 2 L of oxygen at home at baseline Chest tube clamped per PCCM, resume 2x weekly drainage - has outpatient pulm f/u 7/8 at 0930 with  Geraldo Pitter  - they've reviewed drainage with pt Pulm suspects element of trapped lung, won't be candidate for pleurodesis due to this On 6/22 only 50 cc out - repeat CXR 6/23 showed unchanged large left pleural effusion with collapse of the majority of the left lung Spoke to [place] RN from PCCM on 10/30/19 about pleurx drainage and care, saw pt, pt refusing to have pleurx drained due to anticipated pain/anxiety  Spoke to Dr Chase Caller on 11/01/19 about plans for pleurx management, will follow up on the best option as this is a very difficult case Spoke to Dr Lake Bells on 11/03/19, appreciate recs Oncology following, appreciate recommendations Palliative care following for pain control, appreciate assistance  Malignantneoplasm of the right breast [name] is following. Capecitabine started on 10/28/2019  ?Possible HCAP Afebrile, with improving leukocytosis Previous blood cultures no growth till date, repeat BC NGTD Chest x-ray as mentioned above, no pneumonia noted UA neg for infection, UC with multiple species  Procalcitonin downtrending, will trend Continue empiric IV cefepime Daily CBC  Anemia of chronic disease Baseline hemoglobin between 8-10 Anemia panel pending Daily CBC  Hypertension Blood pressure stable at this time Continue atenolol, monitor closely  Diabetes mellitus A1c of 7.8. Continue sliding scaleinsulin for now.She is not on any medications at home. Consider Metformin 500 mg twice a day on discharge  Goals of care Poor prognosis Discussed extensively with daughter about goals of care, wants to continue current treatment plan Very difficult situation, as patient is very weak/deconditioned, refusing even to attempt to drain/flush Pleurx, spends most time sleepy/lethargic in  bed.  Daughter stating the plan to take her home with palliative Palliative care consulted for pain management, goals of care   DVT prophylaxis: (lovenox Code Status: full  Family  Communication: Discussed extensively with daughters on 11/04/2019 Disposition:   Status is: Inpatient  Remains inpatient appropriate because:Inpatient level of care appropriate due to severity of illness   Dispo: The patient is from: Home              Anticipated d/c is to: Home              Anticipated d/c date is: 2 days              Patient currently is not medically stable to d/c.   Consultants:   PCCM  Pulm  Oncology  Palliative  Procedures:  None  Antimicrobials: Anti-infectives (From admission, onward)   Start     Dose/Rate Route Frequency Ordered Stop   10/31/19 0900  ceFEPIme (MAXIPIME) 2 g in sodium chloride 0.9 % 100 mL IVPB     Discontinue     2 g 200 mL/hr over 30 Minutes Intravenous Every 8 hours 10/31/19 0810     10/16/19 1400  ceFEPIme (MAXIPIME) 2 g in sodium chloride 0.9 % 100 mL IVPB  Status:  Discontinued        2 g 200 mL/hr over 30 Minutes Intravenous Every 8 hours 10/16/19 0817 10/17/19 1426   10/16/19 0530  vancomycin (VANCOCIN) IVPB 1000 mg/200 mL premix        1,000 mg 200 mL/hr over 60 Minutes Intravenous  Once 10/16/19 0520 10/16/19 0741   10/16/19 0530  ceFEPIme (MAXIPIME) 2 g in sodium chloride 0.9 % 100 mL IVPB        2 g 200 mL/hr over 30 Minutes Intravenous  Once 10/16/19 0520 10/16/19 0741      Subjective: Patient continued to be deconditioned, reports generalized pain is under control with current pain management.  Denies any other new complaints.  A family meeting was requested by daughters to have all the attendance together to discuss plan of care for their mother.  Contacted PCCM Dr. Lake Bells, palliative Dr. Domingo Cocking, and Minette Headland NP from oncology, unfortunately this did not pull-through due to our different schedules despite every attempt to come together.   Objective: Vitals:   11/03/19 2035 11/03/19 2149 11/04/19 0532 11/04/19 1328  BP: 116/75  124/75 120/80  Pulse: 82  91 83  Resp: 20  20 16   Temp: 98.8 F (37.1 C)  98.7  F (37.1 C) 98.3 F (36.8 C)  TempSrc: Oral  Oral   SpO2: 100% 98% 95% 100%  Weight:      Height:        Intake/Output Summary (Last 24 hours) at 11/04/2019 1901 Last data filed at 11/04/2019 1300 Gross per 24 hour  Intake 60 ml  Output 700 ml  Net -640 ml   Filed Weights   10/16/19 0431 10/27/19 1854 10/28/19 0406  Weight: 80.3 kg 83.1 kg 81.3 kg    Examination:  General: NAD, lethargic, very deconditioned  Cardiovascular: S1, S2 present  Respiratory:  Diminished breath sounds on the left, with noted Pleurx drain  Abdomen: Soft, nontender, nondistended, bowel sounds present  Musculoskeletal: No bilateral pedal edema noted  Skin: Normal  Psychiatry:  Poor mood    Data Reviewed: I have personally reviewed following labs and imaging studies  CBC: Recent Labs  Lab 10/31/19 0546 11/01/19 0545 11/02/19 0603 11/03/19 0605 11/04/19 0524  WBC  13.9* 11.1* 9.8 9.7 8.8  NEUTROABS 12.2* 9.9* 8.5* 7.9* 6.9  HGB 8.0* 8.3* 7.6* 7.7* 7.7*  HCT 24.5* 26.1* 23.3* 23.7* 24.0*  MCV 86.9 88.5 86.9 86.8 87.3  PLT 538* 514* 505* 516* 506*    Basic Metabolic Panel: Recent Labs  Lab 10/29/19 0644 10/29/19 0644 10/30/19 0531 10/31/19 0546 11/01/19 0545 11/02/19 0603 11/03/19 0605  NA 138   < > 137 135 139 140 140  K 4.5   < > 4.8 5.1 4.8 4.1 4.3  CL 99   < > 100 98 102 106 107  CO2 22   < > 21* 23 22 22 24   GLUCOSE 135*   < > 127* 134* 131* 138* 125*  BUN 35*   < > 39* 46* 45* 39* 43*  CREATININE 1.02*   < > 1.00 1.02* 0.83 0.74 0.93  CALCIUM 9.4   < > 9.4 9.4 9.7 9.2 9.4  MG 1.9  --   --   --   --   --   --   PHOS 4.6  --   --   --   --   --   --    < > = values in this interval not displayed.    GFR: Estimated Creatinine Clearance: 71.8 mL/min (by C-G formula based on SCr of 0.93 mg/dL).  Liver Function Tests: Recent Labs  Lab 10/30/19 0531 10/31/19 0546 11/01/19 0545 11/02/19 0603 11/03/19 0605  AST 62* 67* 52* 45* 39  ALT 21 24 22 19 18   ALKPHOS 85  94 94 92 92  BILITOT 1.0 0.7 0.6 0.5 0.7  PROT 6.8 6.9 6.8 6.4* 6.6  ALBUMIN 2.3* 2.2* 2.3* 2.0* 2.2*    CBG: Recent Labs  Lab 11/03/19 2032 11/04/19 0038 11/04/19 0522 11/04/19 1333 11/04/19 1603  GLUCAP 143* 150* 130* 142* 118*     Recent Results (from the past 240 hour(s))  Urine Culture     Status: Abnormal   Collection Time: 10/31/19  5:37 AM   Specimen: Urine, Clean Catch  Result Value Ref Range Status   Specimen Description   Final    URINE, CLEAN CATCH Performed at [facility], [place] [address] [place], [place] [address]    Special Requests   Final    NONE Performed at [facility], [place] [address], [place], [place] [address]    Culture (A)  Final    60,000 COLONIES/mL MULTIPLE SPECIES PRESENT, SUGGEST RECOLLECTION   Report Status 11/01/2019 FINAL  Final  Culture, blood (routine x 2)     Status: None (Preliminary result)   Collection Time: 10/31/19  5:41 AM   Specimen: BLOOD  Result Value Ref Range Status   Specimen Description   Final    BLOOD LEFT ARM Performed at [place] [address], [place], [place] [address]    Special Requests   Final    BOTTLES DRAWN AEROBIC ONLY Blood Culture results may not be optimal due to an inadequate volume of blood received in culture bottles Performed at [place] [address], [place], [place] [address]    Culture   Final    NO GROWTH 4 DAYS Performed at [facility], [place] [address] [place], [place] [address]    Report Status PENDING  Incomplete  Culture, blood (routine x 2)     Status: None (Preliminary result)   Collection Time: 10/31/19  5:46 AM   Specimen: BLOOD LEFT HAND  Result Value Ref Range Status   Specimen Description   Final    BLOOD LEFT HAND Performed at [place] [address], [place], [place] [address]    Special Requests   Final    BOTTLES DRAWN AEROBIC ONLY Blood Culture results  may not be optimal due to an inadequate volume of blood received in culture bottles Performed at [place] [address] [place], [place] [address]    Culture   Final    NO GROWTH 4 DAYS Performed at [facility], [place] [address] [place], [place] [address]    Report Status PENDING  Incomplete         Radiology Studies: No results found.      Scheduled Meds: . acetaminophen  500 mg Oral TID WC  . atenolol  100 mg Oral Daily  . capecitabine  1,500 mg Oral BID PC  . enoxaparin (LOVENOX) injection  40 mg Subcutaneous Q24H  . feeding supplement (ENSURE ENLIVE)  237 mL Oral TID BM  . feeding supplement (PRO-STAT SUGAR FREE 64)  30 mL Oral BID  . fentaNYL  1 patch Transdermal Q72H  . fentaNYL  1 patch Transdermal Q72H  . hydrocortisone cream   Topical BID  . insulin aspart  0-15 Units Subcutaneous Q4H  . levalbuterol  0.63 mg Nebulization TID  . magic mouthwash w/lidocaine  5 mL Oral QID  . mouth rinse  15 mL Mouth Rinse BID  . nystatin  5 mL Oral QID  . polyethylene glycol  17 g Oral BID  . promethazine  12.5 mg Oral Q6H  . senna  2 tablet Oral BID  . sucralfate  1 g Oral TID WC & HS  . venlafaxine XR  75 mg Oral Q breakfast   Continuous Infusions: . ceFEPime (MAXIPIME) IV 2 g (11/04/19 1753)     LOS: 19 days     Alma Friendly, MD [facility]  11/04/2019, 7:01 PM

## 2019-11-04 NOTE — Progress Notes (Signed)
Autumn Cook   DOB:30-Jun-1960   ZO#:[id number]   JWJ#:[id number]  Subjective:  Autumn Cook reports that she feels better today.  States that she has less pain in her left chest.  She thinks that they may drain her Pleurx catheter at some point today.  She seems to be agreeable to this today.  She has been started on Xeloda and seems to tolerating this well.  She denies nausea, vomiting, diarrhea.  States she has some soreness in her mouth.  Objective: African American woman examined in bed Vitals:   11/04/19 0532 11/04/19 1328  BP: 124/75 120/80  Pulse: 91 83  Resp: 20 16  Temp: 98.7 F (37.1 C) 98.3 F (36.8 C)  SpO2: 95% 100%    Body mass index is 28.94 kg/m.  Intake/Output Summary (Last 24 hours) at 11/04/2019 1431 Last data filed at 11/04/2019 1300 Gross per 24 hour  Intake 160 ml  Output 700 ml  Net -540 ml    Lungs no rales or rhonchi Heart regular rate and rhythm Abd soft, nontender Neuro: nonfocal Breasts: Deferred    CBG (last 3)  Recent Labs    11/04/19 0038 11/04/19 0522 11/04/19 1333  GLUCAP 150* 130* 142*     Labs:  Lab Results  Component Value Date   WBC 8.8 11/04/2019   HGB 7.7 (L) 11/04/2019   HCT 24.0 (L) 11/04/2019   MCV 87.3 11/04/2019   PLT 506 (H) 11/04/2019   NEUTROABS 6.9 11/04/2019    _0 @  Urine Studies No results for input(s): UHGB, CRYS in the last 72 hours.  Invalid input(s): UACOL, UAPR, USPG, UPH, UTP, UGL, UKET, UBIL, UNIT, UROB, ULEU, UEPI, UWBC, URBC, UBAC, CAST, [place], [place]  Basic Metabolic Panel: Recent Labs  Lab 10/29/19 [phone] 10/29/19 0865 10/30/19 0531 10/30/19 0531 10/31/19 0546 10/31/19 0546 11/01/19 0545 11/01/19 0545 11/02/19 0603 11/03/19 0605  NA 138   < > 137  --  135  --  139  --  140 140  K 4.5   < > 4.8   < > 5.1   < > 4.8   < > 4.1 4.3  CL 99   < > 100  --  98  --  102  --  106 107  CO2 22   < > 21*  --  23  --  22  --  22 24  GLUCOSE 135*   < > 127*  --  134*  --  131*  --  138* 125*   BUN 35*   < > 39*  --  46*  --  45*  --  39* 43*  CREATININE 1.02*   < > 1.00  --  1.02*  --  0.83  --  0.74 0.93  CALCIUM 9.4   < > 9.4  --  9.4  --  9.7  --  9.2 9.4  MG 1.9  --   --   --   --   --   --   --   --   --   PHOS 4.6  --   --   --   --   --   --   --   --   --    < > = values in this interval not displayed.   GFR Estimated Creatinine Clearance: 71.8 mL/min (by C-G formula based on SCr of 0.93 mg/dL). Liver Function Tests: Recent Labs  Lab 10/30/19 0531 10/31/19 0546 11/01/19 0545 11/02/19 0603 11/03/19 0605  AST 62*  67* 52* 45* 39  ALT _0 ALKPHOS 85 94 94 92 92  BILITOT 1.0 0.7 0.6 0.5 0.7  PROT 6.8 6.9 6.8 6.4* 6.6  ALBUMIN 2.3* 2.2* 2.3* 2.0* 2.2*   No results for input(s): LIPASE, AMYLASE in the last 168 hours. No results for input(s): AMMONIA in the last 168 hours. Coagulation profile No results for input(s): INR, PROTIME in the last 168 hours.  CBC: Recent Labs  Lab 10/31/19 0546 11/01/19 0545 11/02/19 0603 11/03/19 0605 11/04/19 0524  WBC 13.9* 11.1* 9.8 9.7 8.8  NEUTROABS 12.2* 9.9* 8.5* 7.9* 6.9  HGB 8.0* 8.3* 7.6* 7.7* 7.7*  HCT 24.5* 26.1* 23.3* 23.7* 24.0*  MCV 86.9 88.5 86.9 86.8 87.3  PLT 538* 514* 505* 516* 506*   Cardiac Enzymes: No results for input(s): CKTOTAL, CKMB, CKMBINDEX, TROPONINI in the last 168 hours. BNP: Invalid input(s): POCBNP CBG: Recent Labs  Lab 11/03/19 1658 11/03/19 2032 11/04/19 0038 11/04/19 0522 11/04/19 1333  GLUCAP 136* 143* 150* 130* 142*   D-Dimer No results for input(s): DDIMER in the last 72 hours. Hgb A1c No results for input(s): HGBA1C in the last 72 hours. Lipid Profile No results for input(s): CHOL, HDL, LDLCALC, TRIG, CHOLHDL, LDLDIRECT in the last 72 hours. Thyroid function studies No results for input(s): TSH, T4TOTAL, T3FREE, THYROIDAB in the last 72 hours.  Invalid input(s): FREET3 Anemia work up Recent Labs    11/03/19 0605  VITAMINB12 1,161*  FOLATE 6.6   FERRITIN 1,284*  TIBC 153*  IRON 34   Microbiology Recent Results (from the past 240 hour(s))  Urine Culture     Status: Abnormal   Collection Time: 10/31/19  5:37 AM   Specimen: Urine, Clean Catch  Result Value Ref Range Status   Specimen Description   Final    URINE, CLEAN CATCH Performed at [facility], [place] [address] [place], [place] [address]    Special Requests   Final    NONE Performed at [facility], [place] [address] [place], [place] [address]    Culture (A)  Final    60,000 COLONIES/mL MULTIPLE SPECIES PRESENT, SUGGEST RECOLLECTION   Report Status 11/01/2019 FINAL  Final  Culture, blood (routine x 2)     Status: None (Preliminary result)   Collection Time: 10/31/19  5:41 AM   Specimen: BLOOD  Result Value Ref Range Status   Specimen Description   Final    BLOOD LEFT ARM Performed at [place] [address], [place], [place] [address]    Special Requests   Final    BOTTLES DRAWN AEROBIC ONLY Blood Culture results may not be optimal due to an inadequate volume of blood received in culture bottles Performed at [facility] [place] [address] [place], [place] [address]    Culture   Final    NO GROWTH 4 DAYS Performed at [facility], [place] [address], [place], [place] [address]    Report Status PENDING  Incomplete  Culture, blood (routine x 2)     Status: None (Preliminary result)   Collection Time: 10/31/19  5:46 AM   Specimen: BLOOD LEFT HAND  Result Value Ref Range Status   Specimen Description   Final    BLOOD LEFT HAND Performed at [facility] [address] [place], [place] [address]    Special Requests   Final    BOTTLES DRAWN AEROBIC ONLY Blood Culture results may not be optimal  due to an inadequate volume of blood received in culture bottles Performed at [place] [address] [place], [place] [address]    Culture    Final    NO GROWTH 4 DAYS Performed at [facility], [place] [address], [place], [place] [address]    Report Status PENDING  Incomplete      Studies:  No results found.  Assessment: 59 y.o. [place], [place] woman status post right breast biopsy x2 on 07/10/2018 for multicentric invasive ductal carcinoma, clinically T1c N1, stage IIB, grade 3, functionally triple negative, with an MIB-1 of 70% (a) right axillary lymph node biopsied at the same time was positive  (1) neoadjuvant chemotherapy consisting of doxorubicin and cyclophosphamide in dose dense fashion x4 started 08/07/2018, completed 09/16/2018, followed by weekly Abraxane and carboplatin x12 starting 08/07/2018, stopped after 2 cycles (last dose on 10/15/2018) due to peripheral neuropathy   (2) Right breast lumpectomy on 12/27/2018 shows a ypT1b ypN0 residual invasive ductal carcinoma, margins negative. (a) 5 sentinel nodes were negative.  (b) Estrogen receptor 75% positive, weak, Progesterone receptor negative.  (3) adjuvant radiation  Radiation Treatment Dates: 02/18/2019 through 04/02/2019 Site Technique Total Dose (Gy) Dose per Fx (Gy) Completed Fx Beam Energies  Breast: Breast_Rt 3D 50.4/50.4 1.8 28/28 6X, 10X  Breast: Breast_Rt_axilla 3D 50.4/50.4 1.8 28/28 6X, 10X  Breast: Breast_Rt_Bst 3D 20/20 2 10/10 6X, 10X    (4) genetics testing 08/06/2018 through the Breast + GYN Cancers Panel offered by [facility] found no deleterious mutationsin ATM, BARD1, BRCA1, BRCA2, BRIP1, CDH1, CHEK2, DICER1, EPCAM, MLH1, MSH2, MSH6, NBN, NF1, PALB2, PMS2, PTEN, RAD50, RAD51C, RAD51D, SMARCA4, STK11, TP53.   (5) anastrozole started 05/14/2018 (a) bone density 09/22/2019 shows a T score of -0.8 (normal).  METASTATIC DISEASE: June 2021 (6)CT renalstonestudy 09/30/2019 to evaluate left flank pain shows left pleural effusion and possible liver lesions (a)cytology from the  left pleural effusion 10/03/2019 confirms metastatic disease (b)liver MRI 10/04/2019 shows multiple liver lesions (c) prognostic panel requested 10/10/2019 on cytology from 10/03/2019 flui  (7) left effusion: left pleurx placement 10/13/2019  (a) loculated, not a candidate for pleurodesis per pulmonary, would need decortication  (8) pain: currently on baseline fentanyl 25 mcg/h patch  (a) breakthrough opiates and naproxen/tylenol  (b) bowel prophylaxis--MiraLAX Colace, sorbitol as needed  (9) capecitabine 1500 mg po BID 14 days on, 7 days off to start 10/28/2019   Plan:  Dawnyel reports that her pain is improving.  She is currently on scheduled acetaminophen and is on a fentanyl patch at 75 mcg/h which is changed every 3 days.  Her last dose of tramadol was given last evening and last dose of IV morphine was given yesterday morning.  Appreciate assistance of palliative care team with pain medication management.  She has been started on Xeloda and is tolerating this quite well.  Reports some pain in her mouth.  I do not see any obvious ulcers.  I have started her on Biotene mouth rinse as needed and scheduled Magic mouthwash before meals and at bedtime.  The patient continues to have some pain at her Pleurx catheter site which she states is better today.  I have reviewed the PCCM notes who indicated that she is not allowing staff to file or drain the Pleurx catheter.  A temporary chest tube has also been offered as an option but the patient is not interested in this option.  PCCM plans to follow-up for consideration of Pleurx catheter removal if the patient  is no longer using it.  No family was at the bedside today at the time my visit.  I will start back again later today to try to update the family.  Mikey Bussing, NP 11/04/2019  2:31 PM Medical Oncology and Hematology [facility] [address] [place], [place] [address] Tel. [phone]    Fax.  [phone]

## 2019-11-04 NOTE — Progress Notes (Signed)
Daily Progress Note   Patient Name: Autumn Cook       Date: 11/04/2019 DOB: 10-05-1960  Age: 59 y.o. MRN#: [id number] Attending Physician: Alma Friendly, MD Primary Care Physician: Darreld Mclean, MD Admit Date: 10/16/2019  Reason for Consultation/Follow-up: Non pain symptom management and Pain control  Subjective: I saw and examined Autumn Cook today.  She was alert and sitting in bed in no distress.  Daughters present at the bedside.  Discussed increase in tramadol and consideration for increase in fentanyl patch.  Challenge continues to be that she is having sleepiness at times that family would like to minimize while also having severe uncontrolled incident pain, particularly with movements or manipulation of her drain.   Length of Stay: 19  Current Medications: Scheduled Meds:  . acetaminophen  500 mg Oral TID WC  . atenolol  100 mg Oral Daily  . capecitabine  1,500 mg Oral BID PC  . enoxaparin (LOVENOX) injection  40 mg Subcutaneous Q24H  . feeding supplement (ENSURE ENLIVE)  237 mL Oral TID BM  . feeding supplement (PRO-STAT SUGAR FREE 64)  30 mL Oral BID  . fentaNYL  1 patch Transdermal Q72H  . fentaNYL  1 patch Transdermal Q72H  . hydrocortisone cream   Topical BID  . insulin aspart  0-15 Units Subcutaneous Q4H  . levalbuterol  0.63 mg Nebulization TID  . magic mouthwash w/lidocaine  5 mL Oral QID  . mouth rinse  15 mL Mouth Rinse BID  . nystatin  5 mL Oral QID  . polyethylene glycol  17 g Oral BID  . promethazine  12.5 mg Oral Q6H  . senna  2 tablet Oral BID  . sucralfate  1 g Oral TID WC & HS  . venlafaxine XR  75 mg Oral Q breakfast   PRN Meds: antiseptic oral rinse, ipratropium-albuterol, LORazepam, metoprolol tartrate, morphine injection,  prochlorperazine **OR** prochlorperazine, sorbitol, traMADol  Physical Exam     General: Awake,  in no acute distress.  Heart: Regular rate and rhythm.  Abdomen: non distended.  Ext: No significant edema Skin: Warm and dry      Vital Signs: BP 126/81 (BP Location: Left Arm)   Pulse 83   Temp 98.3 F (36.8 C)   Resp 18   Ht 5\' 6"  (1.676 m)  Wt 81.3 kg   LMP 11/14/2013   SpO2 100%   BMI 28.94 kg/m  SpO2: SpO2: 100 % O2 Device: O2 Device: Nasal Cannula O2 Flow Rate: O2 Flow Rate (L/min): 2 L/min  Intake/output summary:   Intake/Output Summary (Last 24 hours) at 11/04/2019 2314 Last data filed at 11/04/2019 2200 Gross per 24 hour  Intake 460 ml  Output 700 ml  Net -240 ml   LBM: Last BM Date: 11/03/19 Baseline Weight: Weight: 80.3 kg Most recent weight: Weight: 81.3 kg       Palliative Assessment/Data: Flowsheet Rows     Most Recent Value  Intake Tab  Referral Department Hospitalist  Unit at Time of Referral Other (Comment)  [urology/telementry]  Palliative Care Primary Diagnosis Cancer  Date Notified 10/20/19  Palliative Care Type New Palliative care  Reason for referral Pain, Non-pain Symptom  Date of Admission 10/16/19  Date first seen by Palliative Care 10/20/19  # of days Palliative referral response time 0 Day(s)  # of days IP prior to Palliative referral 4  Clinical Assessment  Psychosocial & Spiritual Assessment  Palliative Care Outcomes     Patient Active Problem List   Diagnosis Date Noted  . Cancer associated pain   . Therapeutic opioid induced constipation   . Generalized pain   . General weakness   . Palliative care by specialist   . Acute respiratory failure ([place]) 10/16/2019  . Diabetes mellitus type 2, uncontrolled, with complications ([place]) 46/27/0350  . Acute respiratory failure with hypoxia ([place]) 10/16/2019  . Pneumonia of right lower lobe due to infectious organism 10/16/2019  . Metastases to the liver ([place]) 10/10/2019  . Malignant  pleural effusion 10/09/2019  . Pleural effusion on left 10/09/2019  . Type 2 diabetes mellitus with hyperglycemia ([place]) 10/09/2019  . Encounter for central line placement 08/13/2018  . Port-A-Cath in place 08/07/2018  . Family history of lung cancer   . Malignant neoplasm of overlapping sites of right breast in female, estrogen receptor positive ([place]) 07/23/2018  . Achilles tendonitis, bilateral 06/11/2015  . Chest pain with moderate risk for cardiac etiology 10/13/2014  . Hypercholesterolemia 10/13/2014  . Idiopathic scoliosis 08/18/2014  . Hypertension 09/11/2012  . Overweight (BMI 25.0-29.9) 09/11/2012  . Migraine headache 09/11/2012  . Hx of thyroid nodule 09/11/2012  . History of tobacco use 09/11/2012    Palliative Care Assessment & Plan   Patient Profile: 59 year old female with metastatic breast cancer under the care of [name].  She is undergoing chemotherapy and radiation and is status post Pleurx catheter placement with cytology revealing malignant cells.  Currently admitted for discomfort and shortness of breath and pulmonary working to increase drainage from Pleurx catheter.  Plan on discharge she is for follow-up with [name] for further treatment with capecitabine.  Palliative following for pain management.  Recommendations/Plan: -Full code status -Continue tramadol 75mg  PO q6h PRN for incident pain. Discussed option of increasing fentanyl patch.  Concern continues to be that her pain is incident pain rather than being constant.  Family concerned with increasing medications that may make her sleepier (such as increased dose of fentanyl patch) -Encourage OOB and PO intake as tolerated -Patient had BM yesterday - continue miralax and senna for opioid related constipation -PMT will continue to follow holistically  Goals of Care and Additional Recommendations:  Limitations on Scope of Treatment: Full Scope Treatment  Code Status:    Code Status Orders  (From  admission, onward)  Start     Ordered   10/16/19 0803  Full code  Continuous        10/16/19 0808        Code Status History    Date Active Date Inactive Code Status Order ID Comments User Context   10/09/2019 2245 10/14/2019 2035 Full Code [id number]  Rise Patience, MD Inpatient   Advance Care Planning Activity       Prognosis:   Unable to determine  Discharge Planning:  Home with [place] vs SNF for rehab  Care plan was discussed with primary RN, patient, daughter  Thank you for allowing the Palliative Medicine Team to assist in the care of this patient.    Total Time 25 Prolonged Time Billed No    Greater than 50%  of this time was spent counseling and coordinating care related to the above assessment and plan.  Micheline Rough, MD    Please contact Palliative Medicine Team phone at [phone] for questions and concerns.

## 2019-11-04 NOTE — Care Management (Signed)
    Durable Medical Equipment  (From admission, onward)         Start     Ordered   11/03/19 1614  For home use only DME Hospital bed  Once       Question Answer Comment  Length of Need Lifetime   Patient has (list medical condition): R breast CA, L recurrent malignant pleural effusion s/p pleurx drain, DM, HTN   The above medical condition requires: Patient requires the ability to reposition frequently   Head must be elevated greater than: Other see comments   Bed type Semi-electric   Support Surface: Gel Overlay      11/03/19 1613   11/03/19 1423  For home use only DME lightweight manual wheelchair with seat cushion  Once       Comments: Patient suffers from acute on chronic respiratory failure which impairs their ability to perform daily activities like ambulating in the home.  A walker will not resolve issue with performing activities of daily living. A wheelchair will allow patient to safely perform daily activities. Patient is not able to propel themselves in the home using a standard weight wheelchair due to weakness. Patient can self propel in the lightweight wheelchair. Length of need will be lifetime Accessories: elevating leg rests (ELRs), wheel locks, extensions and anti-tippers.   11/03/19 1424   10/29/19 0929  For home use only DME Walker rolling  Once       Question Answer Comment  Walker: With [place]   Patient needs a walker to treat with the following condition Unsteady gait      10/29/19 0946   10/29/19 0928  For home use only DME 3 n 1  Once        10/29/19 [phone]

## 2019-11-04 NOTE — Plan of Care (Signed)

## 2019-11-04 NOTE — Progress Notes (Signed)
[facility] staff would like for RN to make a note about patient refusing food trays. All meals are being delivered, but patient refuses due to eating food family is bringing.

## 2019-11-04 NOTE — TOC Progression Note (Signed)
Transition of Care [facility]) - Progression Note    Patient Details  Name: Autumn Cook MRN: [id number] Date of Birth: [birth year]  Transition of Care [facility]) CM/SW Contact  Takerra Lupinacci, Juliann Pulse, RN Phone Number: 11/04/2019, 10:02 AM  Clinical Narrative: Medical mgmnt still ongoing-CM informed [facility] home care rep [id number] nursing/aide orders will be provided once medical team has confirmed all specific home needs(pleurx drain, 02 assessment,adl's). Continue to monitor.      Expected Discharge Plan: [place] Barriers to Discharge: Continued Medical Work up  Expected Discharge Plan and Services Expected Discharge Plan: [place]   Discharge Planning Services: CM Consult Post Acute Care Choice: Durable Medical Equipment, Home Health Living arrangements for the past 2 months: Single Family Home Expected Discharge Date:  (unknown)               DME Arranged: Oxygen, Walker rolling, Bedside commode, Youth worker wheelchair with seat cushion, Hospital bed         HH Arranged: RN, Nurse's Aide HH Agency: [place] Date HH Agency Contacted: 10/29/19 Time Black: 1229 Representative spoke with at [place]: [place] ([place]) Interventions    Readmission Risk Interventions No flowsheet data found.

## 2019-11-05 DIAGNOSIS — T402X5A Adverse effect of other opioids, initial encounter: Secondary | ICD-10-CM

## 2019-11-05 DIAGNOSIS — K5903 Drug induced constipation: Secondary | ICD-10-CM

## 2019-11-05 LAB — GLUCOSE, CAPILLARY
Glucose-Capillary: 119 mg/dL — ABNORMAL HIGH (ref 70–99)
Glucose-Capillary: 122 mg/dL — ABNORMAL HIGH (ref 70–99)
Glucose-Capillary: 131 mg/dL — ABNORMAL HIGH (ref 70–99)
Glucose-Capillary: 149 mg/dL — ABNORMAL HIGH (ref 70–99)
Glucose-Capillary: 157 mg/dL — ABNORMAL HIGH (ref 70–99)
Glucose-Capillary: 161 mg/dL — ABNORMAL HIGH (ref 70–99)

## 2019-11-05 LAB — CBC WITH DIFFERENTIAL/PLATELET
Abs Immature Granulocytes: 0.64 10*3/uL — ABNORMAL HIGH (ref 0.00–0.07)
Basophils Absolute: 0 10*3/uL (ref 0.0–0.1)
Basophils Relative: 0 %
Eosinophils Absolute: 0.1 10*3/uL (ref 0.0–0.5)
Eosinophils Relative: 1 %
HCT: 25.6 % — ABNORMAL LOW (ref 36.0–46.0)
Hemoglobin: 8.6 g/dL — ABNORMAL LOW (ref 12.0–15.0)
Immature Granulocytes: 8 %
Lymphocytes Relative: 10 %
Lymphs Abs: 0.8 10*3/uL (ref 0.7–4.0)
MCH: 28.6 pg (ref 26.0–34.0)
MCHC: 33.6 g/dL (ref 30.0–36.0)
MCV: 85 fL (ref 80.0–100.0)
Monocytes Absolute: 0.5 10*3/uL (ref 0.1–1.0)
Monocytes Relative: 6 %
Neutro Abs: 6.1 10*3/uL (ref 1.7–7.7)
Neutrophils Relative %: 75 %
Platelets: 520 10*3/uL — ABNORMAL HIGH (ref 150–400)
RBC: 3.01 MIL/uL — ABNORMAL LOW (ref 3.87–5.11)
RDW: 14.6 % (ref 11.5–15.5)
WBC: 8.1 10*3/uL (ref 4.0–10.5)
nRBC: 1 % — ABNORMAL HIGH (ref 0.0–0.2)

## 2019-11-05 LAB — CULTURE, BLOOD (ROUTINE X 2)
Culture: NO GROWTH
Culture: NO GROWTH

## 2019-11-05 MED ORDER — LEVALBUTEROL HCL 0.63 MG/3ML IN NEBU
0.6300 mg | INHALATION_SOLUTION | Freq: Two times a day (BID) | RESPIRATORY_TRACT | Status: DC
  Filled 2019-11-05 (×2): qty 3

## 2019-11-05 NOTE — Progress Notes (Signed)
Chaplain attempted visit but patient had persons bedside.  Will follow-up on Friday if patient is still here. Rev. Tamsen Snider Pager [phone]

## 2019-11-05 NOTE — Progress Notes (Signed)
Daily Progress Note   Patient Name: Autumn Cook       Date: 11/05/2019 DOB: 1960/11/30  Age: 59 y.o. MRN#: [id number] Attending Physician: Nita Sells, MD Primary Care Physician: Darreld Mclean, MD Admit Date: 10/16/2019  Reason for Consultation/Follow-up: Non pain symptom management and Pain control  Subjective: I saw and examined Autumn Cook today.  She was alert and sitting in bed in no distress.  Daughter present at the bedside.    Discussed pain regimen again today and that she has had one dose of rescue tramadol in the last 24 hours.  She and family agree with plan for continuation of this regimen for now as pain well controlled today per her report.  Discussed that fentanyl patch can certainly be further titrated as outpatient if her pain management needs change when she is discharged and hopefully more active.   Length of Stay: 20  Current Medications: Scheduled Meds:  . acetaminophen  500 mg Oral TID WC  . atenolol  100 mg Oral Daily  . capecitabine  1,500 mg Oral BID PC  . enoxaparin (LOVENOX) injection  40 mg Subcutaneous Q24H  . feeding supplement (ENSURE ENLIVE)  237 mL Oral TID BM  . feeding supplement (PRO-STAT SUGAR FREE 64)  30 mL Oral BID  . fentaNYL  1 patch Transdermal Q72H  . fentaNYL  1 patch Transdermal Q72H  . hydrocortisone cream   Topical BID  . insulin aspart  0-15 Units Subcutaneous Q4H  . levalbuterol  0.63 mg Nebulization BID  . magic mouthwash w/lidocaine  5 mL Oral QID  . mouth rinse  15 mL Mouth Rinse BID  . nystatin  5 mL Oral QID  . polyethylene glycol  17 g Oral BID  . promethazine  12.5 mg Oral Q6H  . senna  2 tablet Oral BID  . sucralfate  1 g Oral TID WC & HS  . venlafaxine XR  75 mg Oral Q breakfast   PRN Meds: antiseptic  oral rinse, ipratropium-albuterol, LORazepam, metoprolol tartrate, morphine injection, prochlorperazine **OR** prochlorperazine, sorbitol, traMADol  Physical Exam     General: Awake,  in no acute distress.  Heart: Regular rate and rhythm.  Abdomen: non distended.  Ext: No significant edema Skin: Warm and dry      Vital Signs: BP (!) 135/91 (BP  Location: Left Arm)   Pulse 92   Temp 98.2 F (36.8 C) (Oral)   Resp (!) 22   Ht 5\' 6"  (1.676 m)   Wt 81.3 kg   LMP 11/14/2013   SpO2 95%   BMI 28.94 kg/m  SpO2: SpO2: 95 % O2 Device: O2 Device: Room Air O2 Flow Rate: O2 Flow Rate (L/min): 2 L/min  Intake/output summary:   Intake/Output Summary (Last 24 hours) at 11/05/2019 1658 Last data filed at 11/05/2019 0200 Gross per 24 hour  Intake 500 ml  Output --  Net 500 ml   LBM: Last BM Date: 11/05/19 Baseline Weight: Weight: 80.3 kg Most recent weight: Weight: 81.3 kg       Palliative Assessment/Data: Flowsheet Rows     Most Recent Value  Intake Tab  Referral Department Hospitalist  Unit at Time of Referral Other (Comment)  [urology/telementry]  Palliative Care Primary Diagnosis Cancer  Date Notified 10/20/19  Palliative Care Type New Palliative care  Reason for referral Pain, Non-pain Symptom  Date of Admission 10/16/19  Date first seen by Palliative Care 10/20/19  # of days Palliative referral response time 0 Day(s)  # of days IP prior to Palliative referral 4  Clinical Assessment  Psychosocial & Spiritual Assessment  Palliative Care Outcomes     Patient Active Problem List   Diagnosis Date Noted  . Cancer associated pain   . Therapeutic opioid induced constipation   . Generalized pain   . General weakness   . Palliative care by specialist   . Acute respiratory failure ([place]) 10/16/2019  . Diabetes mellitus type 2, uncontrolled, with complications ([place]) 40/98/1191  . Acute respiratory failure with hypoxia ([place]) 10/16/2019  . Pneumonia of right lower lobe due to  infectious organism 10/16/2019  . Metastases to the liver ([place]) 10/10/2019  . 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 Pleural effusion on left 10/09/2019  . Type 2 diabetes mellitus with hyperglycemia ([place]) 10/09/2019  . Encounter for central line placement 08/13/2018  . Port-A-Cath in place 08/07/2018  . Family history of lung cancer   . Malignant neoplasm of overlapping sites of right breast in female, estrogen receptor positive ([place]) 07/23/2018  . Achilles tendonitis, bilateral 06/11/2015  . Chest pain with moderate risk for cardiac etiology 10/13/2014  . Hypercholesterolemia 10/13/2014  . Idiopathic scoliosis 08/18/2014  . Hypertension 09/11/2012  . Overweight (BMI 25.0-29.9) 09/11/2012  . Migraine headache 09/11/2012  . Hx of thyroid nodule 09/11/2012  . History of tobacco use 09/11/2012    Palliative Care Assessment & Plan   Patient Profile: 59 year old female with metastatic breast cancer under the care of Dr. Jana Cook.  She is undergoing chemotherapy and radiation and is status post Pleurx catheter placement with cytology revealing malignant cells.  Currently admitted for discomfort and shortness of breath and pulmonary working to increase drainage from Pleurx catheter.  Plan on discharge she is for follow-up with Dr. Jana Cook for further treatment with capecitabine.  Palliative following for pain management.  Recommendations/Plan: -Full code status -Recommend continue fentanyl 75mcg/hr.  Continue tramadol 75mg  PO q6h PRN for incident pain.  Could still consider increase in fentanyl patch, but as breakthrough pain is primarily incident pain, will continue to recommend short acting breakthrough medication as needed with consideration for uptitrating fentanyl patch based on short acting medication needs if pleurex catheter is removed. -Family requested to discuss consideration for removal of pleurex with PCCM. -Encourage OOB and PO intake as tolerated -Patient had BM yesterday -  continue miralax and  senna for opioid related constipation -PMT will continue to follow holistically  Goals of Care and Additional Recommendations:  Limitations on Scope of Treatment: Full Scope Treatment  Code Status:    Code Status Orders  (From admission, onward)         Start     Ordered   10/16/19 0803  Full code  Continuous        10/16/19 0808        Code Status History    Date Active Date Inactive Code Status Order ID Comments User Context   10/09/2019 2245 10/14/2019 2035 Full Code [id number]  Rise Patience, MD Inpatient   Advance Care Planning Activity       Prognosis:   Unable to determine  Discharge Planning:  Home with [facility] vs SNF for rehab  Care plan was discussed with primary RN, patient, daughter  Thank you for allowing the Palliative Medicine Team to assist in the care of this patient.    Total Time 20 Prolonged Time Billed No   Greater than 50%  of this time was spent counseling and coordinating care related to the above assessment and plan.  Micheline Rough, MD    Please contact Palliative Medicine Team phone at [phone] for questions and concerns.

## 2019-11-05 NOTE — Progress Notes (Signed)
Physical Therapy Treatment Patient Details Name: Autumn Cook MRN: [id number] DOB: December 21, 1960 Today's Date: 11/05/2019    History of Present Illness 59 yo female former smoker with metastatic breast cancer complicated by malignant pleural effusion.  She had pleurx catheter placed on 10/14/19.  Palliative medicine has been consulted.    PT Comments    Pt with no dizziness and BP was stable with sitting; however, remains limited by pain. She was able to perform some exercises and lateral scoot with cues.  Encouraged pt to participate as able, but did note pt with palliative consult and focus on pain control and returning home with family as plan.  Cont to progress as able to promote mobility at home and decreased caregiver burden.    Follow Up Recommendations  Supervision/Assistance - 24 hour;Home health PT     Equipment Recommendations  Rolling walker with 5" wheels;3in1 (PT);Wheelchair (measurements PT);Wheelchair cushion (measurements PT);Hospital bed    Recommendations for Other Services       Precautions / Restrictions Precautions Precautions: Fall    Mobility  Bed Mobility Overal bed mobility: Needs Assistance Bed Mobility: Supine to Sit;Sit to Supine     Supine to sit: Min assist Sit to supine: Min assist   General bed mobility comments: increased time; cues for sequence  Transfers Overall transfer level: Needs assistance Equipment used: None Transfers: Lateral/Scoot Transfers          Lateral/Scoot Transfers: Min guard General transfer comment: Pt declined standing due to pain. She was able to laterally scoot toward [facility] with cues for technique  Ambulation/Gait                 Stairs             Wheelchair Mobility    Modified Rankin (Stroke Patients Only)       Balance Overall balance assessment: Needs assistance Sitting-balance support: Feet unsupported;Bilateral upper extremity supported Sitting balance-Leahy Scale: Good Sitting  balance - Comments: seated EOB       Standing balance comment: deferred                            Cognition Arousal/Alertness: Awake/alert Behavior During Therapy: Flat affect Overall Cognitive Status: Within Functional Limits for tasks assessed                                        Exercises General Exercises - Lower Extremity Ankle Circles/Pumps: AROM;Both;10 reps;Seated Long Arc Quad: AROM;Both;10 reps;Seated Hip Flexion/Marching: AROM;Both;10 reps;Seated    General Comments General comments (skin integrity, edema, etc.): Pt had no c/o dizziness today.  All VSS.  BP was 114/62 in sitting.  She did have chest/back pain that increased in sitting after 3-4 minutes and had to return to supine.      Pertinent Vitals/Pain Pain Assessment: 0-10 Pain Score: 7  Pain Location: L side/back and mid chest/back with mobility.  5/10 rest 7/10 sitting Pain Descriptors / Indicators: Guarding;Sharp Pain Intervention(s): Limited activity within patient's tolerance;Premedicated before session;Monitored during session    Home Living                      Prior Function            PT Goals (current goals can now be found in the care plan section) Acute Rehab PT Goals Patient Stated Goal:  to go home, to be able to walk farther PT Goal Formulation: With patient/family Time For Goal Achievement: 11/14/19 Potential to Achieve Goals: Fair Progress towards PT goals: Progressing toward goals    Frequency    Min 3X/week      PT Plan Discharge plan needs to be updated (noted plans to return home with family)    Co-evaluation              AM-PAC PT "6 Clicks" Mobility   Outcome Measure  Help needed turning from your back to your side while in a flat bed without using bedrails?: A Little Help needed moving from lying on your back to sitting on the side of a flat bed without using bedrails?: A Little Help needed moving to and from a bed to a  chair (including a wheelchair)?: A Lot Help needed standing up from a chair using your arms (e.g., wheelchair or bedside chair)?: A Lot Help needed to walk in hospital room?: Total Help needed climbing 3-5 steps with a railing? : Total 6 Click Score: 12    End of Session   Activity Tolerance: Patient limited by pain Patient left: in bed;with call bell/phone within reach;with bed alarm set Nurse Communication: Mobility status PT Visit Diagnosis: Pain;Dizziness and giddiness (R42);Difficulty in walking, not elsewhere classified (R26.2)     Time: 1715-1730 PT Time Calculation (min) (ACUTE ONLY): 15 min  Charges:  $Therapeutic Activity: 8-22 mins                     Abran Richard, PT Acute Rehab Services Pager [phone] [facility] [phone]     Karlton Lemon 11/05/2019, 6:01 PM

## 2019-11-05 NOTE — Progress Notes (Addendum)
Patients daughters request for primary RN to come to room. Daughters expressed concerns over continued use of oxygen. Education provided on the benefits of oxygen use and parameters needed to initiate use. Patient reassessed on room air at rest, oxygen saturation sustained 97%, respirations 16. Daughters request that [name](POA) be notified when changes in plan of care occur. Will continue to monitor.

## 2019-11-05 NOTE — Progress Notes (Signed)
[facility]  Subjective: Says she is feeling no pain, complains of no shortness of breath.  She says it is a strange feeling because she has had so much pain recently.  On exam:  Vitals:   11/05/19 0748 11/05/19 1119 11/05/19 1127 11/05/19 1343  BP:   (!) 142/93 (!) 135/91  Pulse:  97 97 92  Resp:    (!) 22  Temp:    98.2 F (36.8 C)  TempSrc:    Oral  SpO2: 93% 97%  95%  Weight:      Height:        General resting comfortably in bed No breath sounds on the left, clear breath sounds on the right Cardiovascular regular rate and rhythm no murmurs gallops rubs Belly soft, nontender nondistended Extremities warm, no edema, no bruising Neurologic, awake alert, conversant  CBC    Component Value Date/Time   WBC 8.1 11/05/2019 0541   RBC 3.01 (L) 11/05/2019 0541   HGB 8.6 (L) 11/05/2019 0541   HGB 12.9 07/24/2018 0825   HCT 25.6 (L) 11/05/2019 0541   PLT 520 (H) 11/05/2019 0541   PLT 342 07/24/2018 0825   MCV 85.0 11/05/2019 0541   MCV 86.8 12/06/2011 1042   MCH 28.6 11/05/2019 0541   MCHC 33.6 11/05/2019 0541   RDW 14.6 11/05/2019 0541   LYMPHSABS 0.8 11/05/2019 0541   MONOABS 0.5 11/05/2019 0541   EOSABS 0.1 11/05/2019 0541   BASOSABS 0.0 11/05/2019 0541     Impression: Metastatic breast cancer Malignant pleural effusion Severe pain on left chest due to breast cancer  Discussion: Difficult case: On Monday when I spoke with the patient and her family they indicated to me that they did not want any more attempts at drainage of fluid because it caused severe pain and that her primary goal for her was pain control.  At that time the conversation was driven mostly by the patient's daughters as the patient was drowsy at the time, and as they wanted no further drainage of the fluid I recommended Pleurx catheter removal.  They wanted to think about that for a few days to return today to talk about it.  Today I spoke with the patient on her own  and notably this is the first time that have seen her when she was not experiencing significant pain.  Her daughters were not present for today's visit.  I explained to her in great detail that should we remove the Pleurx catheter it is very likely that the pleural fluid would continue to accumulate and cause significant shortness of breath and even death.  I said that this could be mitigated by pleural fluid removal either via the Pleurx catheter or another means but the fluid removal would likely be associated with some degree of pain as it has been in the past.  Today the patient told me in no uncertain terms that she is willing to undergo fluid drainage again and she said "we will see how much it hurts".  She did indicate to me that she doesn't want any more chest tubes.  I spoke to [place] on the phone this evening and she said that they are confused as well as what her mother's wishes because she has been going back and forth with her stated wishes.  She wanted [place] to talk with her in the room again tomorrow before draining the fluid.  I recommend speaking with the patients and her daughters tomorrow again to see  if she is still interested in draining the fluid.  If so, then attempt draining the fluid again tomorrow, considering TPA/Pulmozyme again if necessary.  If ineffective then consider another chest drain (pigtail) vs pleuridesis.  > 35 minutes spent in consultation.  Roselie Awkward, MD [facility] Pager: [phone] Cell: [phone] If no response, call [phone]

## 2019-11-05 NOTE — Progress Notes (Signed)
Patient has refused some medications today. See Mar. Education provided. Will continue to monitor and encourage. Pt  reports resting more comfortably today.

## 2019-11-05 NOTE — Progress Notes (Signed)
PROGRESS NOTE    Autumn Cook  GLO:[id number] DOB: Jul 28, 1960 DOA: 10/16/2019 PCP: Darreld Mclean, MD  Brief Narrative:  59 year old African-American female Recent hospitalization 10/09/2019-10/14/2019 declared stage IV metastatic breast cancer-at that admission pleural tap was done Pleurx catheter was placed Patient was discharged home to follow-up with her oncologist on 6/9 but for some reason she did not get home health no one showed up to sure how to drain the Pleurx She went to [place] on 6/10 with short of breath found to have complete opacification left hemithorax-she was treated as if she had an HCAP and admitted to the hospital for management of the same  She also carries diagnosis of HTN HLD DM TY 2 asthma Pulmonology consulted saw the patient felt she had elements of trapped lung therefore not a candidate for pleurodesis Palliative care saw the patient also in consult to assist with pain control  Assessment & Plan:   Active Problems:   Hypertension   History of tobacco use   Hypercholesterolemia   Malignant neoplasm of overlapping sites of right breast in female, estrogen receptor positive ([place])   Encounter for central line placement   Malignant pleural effusion   Pleural effusion on left   Metastases to the liver [facility])   Acute respiratory failure (HCC)   Diabetes mellitus type 2, uncontrolled, with complications (HCC)   Acute respiratory failure with hypoxia (HCC)   Pneumonia of right lower lobe due to infectious organism   Generalized pain   General weakness   Palliative care by specialist   Cancer associated pain   Therapeutic opioid induced constipation   1. Metastatic stage IV breast cancer with large left-sided effusion a. Status post lumpectomy 12/2018 with neoadjuvant treatment as guided by 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 Dr. Lake Bells had long discussions with patient on 6/28-patient and family are  deciding whether they want to remove the Pleurx catheter or place a separate chest tube c. They have not made their final decision and I will defer to pulmonology to plan with family further plan of care d. Holding at this time Xeloda 2. Cancer related pain a. Appreciate palliative input- b. Current meds fentanyl 37.5 every 72, tramadol 75 every 6 as needed, morphine injection 2 mg every 3 as needed for severe breakthrough pain 3. Diabetes mellitus type 2 A1c 7.8 4. HTN a. Continue atenolol 100 daily--PTA Zestoretic on hold b. As needed Lopressor 5 mg every 8 for heart rate above 105 hold for MAP below 60 5. Asymptomatic bacteriuria a. Multiple colony-forming units 6/24-no further coverage at this time 6. HCAP suspected on admission likely more related to fluid a. Has been on cefepime since 6/25-curtail antibiotics at the 7-day mark 11/06/2019 b. Repeat CBC plus differential and x-ray at that time prior to discontinuation c. White count has improved significantly   DVT prophylaxis:  Code Status: Full Family Communication: Discussed with daughter bedside  Disposition:   Status is: Inpatient  Remains inpatient appropriate because:Altered mental status, Unsafe d/c plan, IV treatments appropriate due to intensity of illness or inability to take PO and Inpatient level of care appropriate due to severity of illness   Dispo: The patient is from: Home              Anticipated d/c is to: Home              Anticipated d/c date is: 3 days  Patient currently is not medically stable to d/c.       Consultants:   Pulmonology  Oncology  Procedures: Multiple attempts at fluid drainage  Antimicrobials: Currently cefepime since 6/25   Subjective: Seems to defer most questions to her daughter No chest pain no fever and no pain at rest without having the drain disturbed No nausea no vomiting  Objective: Vitals:   11/05/19 0748 11/05/19 1119 11/05/19 1127 11/05/19 1343    BP:   (!) 142/93 (!) 135/91  Pulse:  97 97 92  Resp:    (!) 22  Temp:    98.2 F (36.8 C)  TempSrc:    Oral  SpO2: 93% 97%  95%  Weight:      Height:        Intake/Output Summary (Last 24 hours) at 11/05/2019 1543 Last data filed at 11/05/2019 0200 Gross per 24 hour  Intake 500 ml  Output --  Net 500 ml   Filed Weights   10/16/19 0431 10/27/19 1854 10/28/19 0406  Weight: 80.3 kg 83.1 kg 81.3 kg    Examination:  General exam:  pleasant African-American female no distress Respiratory system: Decreased air entry left side posterolaterally no rales Cardiovascular system: S1-S2 no murmur slightly tachycardic Gastrointestinal system: Soft nontender no organomegaly no rebound. Central nervous system: Intact although appears at times sleepy Extremities: No lower extremity edema Skin: No rash Psychiatry: Euthymic  Data Reviewed: I have personally reviewed following labs and imaging studies Hemoglobin 8.6 platelet 520 BUNs/creatinine 43/0.9 Iron 34 saturation 22 B12 1161 Radiology Studies: No results found.   Scheduled Meds: . acetaminophen  500 mg Oral TID WC  . atenolol  100 mg Oral Daily  . capecitabine  1,500 mg Oral BID PC  . enoxaparin (LOVENOX) injection  40 mg Subcutaneous Q24H  . feeding supplement (ENSURE ENLIVE)  237 mL Oral TID BM  . feeding supplement (PRO-STAT SUGAR FREE 64)  30 mL Oral BID  . fentaNYL  1 patch Transdermal Q72H  . fentaNYL  1 patch Transdermal Q72H  . hydrocortisone cream   Topical BID  . insulin aspart  0-15 Units Subcutaneous Q4H  . levalbuterol  0.63 mg Nebulization TID  . magic mouthwash w/lidocaine  5 mL Oral QID  . mouth rinse  15 mL Mouth Rinse BID  . nystatin  5 mL Oral QID  . polyethylene glycol  17 g Oral BID  . promethazine  12.5 mg Oral Q6H  . senna  2 tablet Oral BID  . sucralfate  1 g Oral TID WC & HS  . venlafaxine XR  75 mg Oral Q breakfast   Continuous Infusions: . ceFEPime (MAXIPIME) IV 2 g (11/05/19 1011)      LOS: 20 days    Time spent: [place], MD [facility] To contact the attending provider between 7A-7P or the covering provider during after hours 7P-7A, please log into the web site [email or web address] and access using universal [place] password for that web site. If you do not have the password, please call the hospital operator.  11/05/2019, 3:43 PM

## 2019-11-06 LAB — CBC WITH DIFFERENTIAL/PLATELET
Abs Immature Granulocytes: 0.61 10*3/uL — ABNORMAL HIGH (ref 0.00–0.07)
Basophils Absolute: 0 10*3/uL (ref 0.0–0.1)
Basophils Relative: 0 %
Eosinophils Absolute: 0 10*3/uL (ref 0.0–0.5)
Eosinophils Relative: 0 %
HCT: 24 % — ABNORMAL LOW (ref 36.0–46.0)
Hemoglobin: 8.1 g/dL — ABNORMAL LOW (ref 12.0–15.0)
Immature Granulocytes: 7 %
Lymphocytes Relative: 10 %
Lymphs Abs: 0.9 10*3/uL (ref 0.7–4.0)
MCH: 28.4 pg (ref 26.0–34.0)
MCHC: 33.8 g/dL (ref 30.0–36.0)
MCV: 84.2 fL (ref 80.0–100.0)
Monocytes Absolute: 0.5 10*3/uL (ref 0.1–1.0)
Monocytes Relative: 5 %
Neutro Abs: 7.2 10*3/uL (ref 1.7–7.7)
Neutrophils Relative %: 78 %
Platelets: 575 10*3/uL — ABNORMAL HIGH (ref 150–400)
RBC: 2.85 MIL/uL — ABNORMAL LOW (ref 3.87–5.11)
RDW: 14.6 % (ref 11.5–15.5)
WBC: 9.2 10*3/uL (ref 4.0–10.5)
nRBC: 0.4 % — ABNORMAL HIGH (ref 0.0–0.2)

## 2019-11-06 LAB — GLUCOSE, CAPILLARY
Glucose-Capillary: 133 mg/dL — ABNORMAL HIGH (ref 70–99)
Glucose-Capillary: 136 mg/dL — ABNORMAL HIGH (ref 70–99)
Glucose-Capillary: 138 mg/dL — ABNORMAL HIGH (ref 70–99)
Glucose-Capillary: 138 mg/dL — ABNORMAL HIGH (ref 70–99)
Glucose-Capillary: 141 mg/dL — ABNORMAL HIGH (ref 70–99)
Glucose-Capillary: 179 mg/dL — ABNORMAL HIGH (ref 70–99)

## 2019-11-06 MED ORDER — LEVALBUTEROL HCL 0.63 MG/3ML IN NEBU
0.6300 mg | INHALATION_SOLUTION | Freq: Four times a day (QID) | RESPIRATORY_TRACT | Status: DC | PRN
  Administered 2019-11-07: 0.63 mg via RESPIRATORY_TRACT
  Filled 2019-11-06: qty 3

## 2019-11-06 MED ORDER — SODIUM CHLORIDE (PF) 0.9 % IJ SOLN
10.0000 mg | Freq: Once | INTRAMUSCULAR | Status: AC
  Administered 2019-11-07: 10 mg via INTRAPLEURAL
  Filled 2019-11-06: qty 10

## 2019-11-06 MED ORDER — STERILE WATER FOR INJECTION IJ SOLN
5.0000 mg | Freq: Once | RESPIRATORY_TRACT | Status: AC
  Administered 2019-11-07: 5 mg via INTRAPLEURAL
  Filled 2019-11-06: qty 5

## 2019-11-06 NOTE — Progress Notes (Signed)
To patients room to assess pleurx catheter - daughters Mearl Latin and [name]) at bedside.  Patient had been pre-medicated prior to manipulation of catheter per nursing.  She was fatigued from getting up to use the bedside commode and wanted to rest.  We waited approximately 10 minutes to allow her to recover from exertion and then sat her up on the side of the bed.  Left chest pleurx catheter dressing removed.  Site cleaned, pleurx access kit utilized to flush pleurx which flushes easily.  Attempted to drain with pleurx drainage system but minimal fluid returned.  Site flushed a second time and re-attempted drainage but no further fluid removed.  Discussed intrapleural tPA and dornase for the am.  Family and patient willing to proceed in am.  Dressing reapplied with capped catheter available for access for tPA in am.  Post drainage in am will redress.  Will follow up with patient and family in am.     Noe Gens, MSN, NP-C [facility] 11/06/2019, 2:33 PM   Please see [email or web address] for pager details.

## 2019-11-06 NOTE — Progress Notes (Signed)
PROGRESS NOTE    ARDYN FORGE  QZR:[id number] DOB: 08/21/1960 DOA: 10/16/2019 PCP: Darreld Mclean, MD  Brief Narrative:  59 year old African-American female Recent hospitalization 10/09/2019-10/14/2019 declared stage IV metastatic breast cancer-at that admission pleural tap was done Pleurx catheter was placed Patient was discharged home to follow-up with her oncologist on 6/9 but for some reason she did not get home health no one showed up to sure how to drain the Pleurx She went to [place] on 6/10 with short of breath found to have complete opacification left hemithorax-she was treated as if she had an HCAP and admitted to the hospital for management of the same  She also carries diagnosis of HTN HLD DM TY 2 asthma Pulmonology consulted saw the patient felt she had elements of trapped lung therefore not a candidate for pleurodesis Palliative care saw the patient also in consult to assist with pain control  Assessment & Plan:   Active Problems:   Hypertension   History of tobacco use   Hypercholesterolemia   Malignant neoplasm of overlapping sites of right breast in female, estrogen receptor positive ([place])   Encounter for central line placement   Malignant pleural effusion   Pleural effusion on left   Metastases to the liver [facility])   Acute respiratory failure (HCC)   Diabetes mellitus type 2, uncontrolled, with complications (HCC)   Acute respiratory failure with hypoxia (HCC)   Pneumonia of right lower lobe due to infectious organism   Generalized pain   General weakness   Palliative care by specialist   Cancer associated pain   Therapeutic opioid induced constipation   1. Metastatic stage IV breast cancer with large left-sided effusion a. Status post lumpectomy 12/2018 with neoadjuvant treatment as guided by 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 Decision today seems like we are going ahead with draining Pleurx-appreciate  pulmonology c. Holding at this time Xeloda 2. Cancer related pain a. Appreciate palliative input- b. Continuing fentanyl 37.5 every 72, tramadol 75 every 6 as needed, morphine injection 2 mg every 3 as needed for severe breakthrough pain 3. Diabetes mellitus type 2 A1c 7.8 a. No meds currently other than sliding scale 4. HTN a. Continue atenolol 100 daily--PTA Zestoretic on hold b. As needed Lopressor 5 mg every 8 for heart rate above 105 hold for MAP below 60 5. Asymptomatic bacteriuria a. Multiple colony-forming units 6/24-no further coverage at this time 6. HCAP suspected on admission likely more related to fluid a. Has been on cefepime since 6/25-curtail antibiotics at the 7-day mark 11/06/2019 and will DC today and observe b. White count has improved significantly   DVT prophylaxis:  Code Status: Full Family Communication: Discussed with daughter bedside  Disposition:   Status is: Inpatient  Remains inpatient appropriate because:Altered mental status, Unsafe d/c plan, IV treatments appropriate due to intensity of illness or inability to take PO and Inpatient level of care appropriate due to severity of illness   Dispo: The patient is from: Home              Anticipated d/c is to: Home              Anticipated d/c date is: 3 days              Patient currently is not medically stable to d/c.       Consultants:   Pulmonology  Oncology  Procedures: Multiple attempts at fluid drainage  Antimicrobials: Currently cefepime since  6/25   Subjective: Seems well Has decided to undergo Pleurx drainage Pulmonology was addressing all of patient and family's concerns when I was present There are no questions  Objective: Vitals:   11/05/19 1127 11/05/19 1343 11/05/19 2005 11/06/19 0435  BP: (!) 142/93 (!) 135/91 137/87 130/90  Pulse: 97 92 77 82  Resp:  (!) 22 16 20   Temp:  98.2 F (36.8 C) 97.8 F (36.6 C) 97.6 F (36.4 C)  TempSrc:  Oral Oral Oral  SpO2:  95% 96%  95%  Weight:      Height:       No intake or output data in the 24 hours ending 11/06/19 1337 Filed Weights   10/16/19 0431 10/27/19 1854 10/28/19 0406  Weight: 80.3 kg 83.1 kg 81.3 kg    Examination:  No change to exam  General exam:  pleasant African-American female no distress Respiratory system: Decreased air entry left side posterolaterally no rales Cardiovascular system: S1-S2 no murmur slightly tachycardic Gastrointestinal system: Soft nontender no organomegaly no rebound. Central nervous system: Intact although appears at times sleepy Extremities: No lower extremity edema Skin: No rash Psychiatry: Euthymic  Data Reviewed: I have personally reviewed following labs and imaging studies Hemoglobin 8.6--8.1 platelet 575 Iron 34 saturation 22 B12 1161 Radiology Studies: No results found.   Scheduled Meds: . acetaminophen  500 mg Oral TID WC  . atenolol  100 mg Oral Daily  . capecitabine  1,500 mg Oral BID PC  . enoxaparin (LOVENOX) injection  40 mg Subcutaneous Q24H  . feeding supplement (ENSURE ENLIVE)  237 mL Oral TID BM  . feeding supplement (PRO-STAT SUGAR FREE 64)  30 mL Oral BID  . fentaNYL  1 patch Transdermal Q72H  . fentaNYL  1 patch Transdermal Q72H  . hydrocortisone cream   Topical BID  . insulin aspart  0-15 Units Subcutaneous Q4H  . magic mouthwash w/lidocaine  5 mL Oral QID  . mouth rinse  15 mL Mouth Rinse BID  . nystatin  5 mL Oral QID  . polyethylene glycol  17 g Oral BID  . promethazine  12.5 mg Oral Q6H  . senna  2 tablet Oral BID  . sucralfate  1 g Oral TID WC & HS   Continuous Infusions: . ceFEPime (MAXIPIME) IV 2 g (11/06/19 1029)     LOS: 21 days    Time spent: [place], MD [facility] To contact the attending provider between 7A-7P or the covering provider during after hours 7P-7A, please log into the web site [email or web address] and access using universal [place] password for that web site. If you do not have  the password, please call the hospital operator.  11/06/2019, 1:37 PM

## 2019-11-06 NOTE — Plan of Care (Signed)

## 2019-11-06 NOTE — Progress Notes (Signed)
Palliative care brief progress note  I met briefly with Autumn Cook and her daughters.  Reviewed meeting with Dr. Lake Bells last evening and options of another trial of pleurex draining vs option for removal if she is not going to utilize pleurex moving forward.  She expressed desire for another trial of drainage via pleurex.   Micheline Rough, MD [facility] Team [phone]  NO CHARGE NOTE

## 2019-11-07 DIAGNOSIS — Z7189 Other specified counseling: Secondary | ICD-10-CM

## 2019-11-07 LAB — GLUCOSE, CAPILLARY
Glucose-Capillary: 106 mg/dL — ABNORMAL HIGH (ref 70–99)
Glucose-Capillary: 110 mg/dL — ABNORMAL HIGH (ref 70–99)
Glucose-Capillary: 111 mg/dL — ABNORMAL HIGH (ref 70–99)
Glucose-Capillary: 123 mg/dL — ABNORMAL HIGH (ref 70–99)
Glucose-Capillary: 133 mg/dL — ABNORMAL HIGH (ref 70–99)
Glucose-Capillary: 135 mg/dL — ABNORMAL HIGH (ref 70–99)
Glucose-Capillary: 151 mg/dL — ABNORMAL HIGH (ref 70–99)

## 2019-11-07 LAB — CBC WITH DIFFERENTIAL/PLATELET
Abs Immature Granulocytes: 0.62 10*3/uL — ABNORMAL HIGH (ref 0.00–0.07)
Basophils Absolute: 0 10*3/uL (ref 0.0–0.1)
Basophils Relative: 0 %
Eosinophils Absolute: 0.1 10*3/uL (ref 0.0–0.5)
Eosinophils Relative: 1 %
HCT: 26.2 % — ABNORMAL LOW (ref 36.0–46.0)
Hemoglobin: 8.2 g/dL — ABNORMAL LOW (ref 12.0–15.0)
Immature Granulocytes: 6 %
Lymphocytes Relative: 10 %
Lymphs Abs: 1.1 10*3/uL (ref 0.7–4.0)
MCH: 28.6 pg (ref 26.0–34.0)
MCHC: 31.3 g/dL (ref 30.0–36.0)
MCV: 91.3 fL (ref 80.0–100.0)
Monocytes Absolute: 0.6 10*3/uL (ref 0.1–1.0)
Monocytes Relative: 5 %
Neutro Abs: 8.4 10*3/uL — ABNORMAL HIGH (ref 1.7–7.7)
Neutrophils Relative %: 78 %
Platelets: 484 10*3/uL — ABNORMAL HIGH (ref 150–400)
RBC: 2.87 MIL/uL — ABNORMAL LOW (ref 3.87–5.11)
RDW: 15.4 % (ref 11.5–15.5)
WBC: 10.7 10*3/uL — ABNORMAL HIGH (ref 4.0–10.5)
nRBC: 0.7 % — ABNORMAL HIGH (ref 0.0–0.2)

## 2019-11-07 LAB — BASIC METABOLIC PANEL
Anion gap: 12 (ref 5–15)
BUN: 55 mg/dL — ABNORMAL HIGH (ref 6–20)
CO2: 20 mmol/L — ABNORMAL LOW (ref 22–32)
Calcium: 9.6 mg/dL (ref 8.9–10.3)
Chloride: 108 mmol/L (ref 98–111)
Creatinine, Ser: 1.04 mg/dL — ABNORMAL HIGH (ref 0.44–1.00)
GFR calc Af Amer: >60 mL/min (ref >60)
GFR calc non Af Amer: 60 mL/min — ABNORMAL LOW (ref >60)
Glucose, Bld: 141 mg/dL — ABNORMAL HIGH (ref 70–99)
Potassium: 4.7 mmol/L (ref 3.5–5.1)
Sodium: 140 mmol/L (ref 135–145)

## 2019-11-07 NOTE — Progress Notes (Signed)
Attempted to get CBG, daughter told me to come back later.  Unable to see pt, curtain closed, assume she is on [facility].  Will check again in a few minutes.

## 2019-11-07 NOTE — Progress Notes (Signed)
Patient identified, medication dosing/route verified.  Intra-pleural tPA instilled per protocol.  Medication to dwell for two hours and then will drain and re-dress site.  Patient instructed to turn side to side if possible.      Noe Gens, MSN, NP-C [facility] 11/07/2019, 9:05 AM   Please see [email or web address] for pager details.

## 2019-11-07 NOTE — Progress Notes (Signed)
PT Cancellation Note  Patient Details Name: Autumn Cook MRN: [id number] DOB: 1960/07/04   Cancelled Treatment:    Reason Eval/Treat Not Completed: Other (comment) Attempted 3 x today but pt very lethargic and unable to stay awake.  First attempt family reporting pt wants to try to do something, but last attempt family reports not today.  Will cont to follow as able.  Abran Richard, PT Acute Rehab Services Pager [phone] [facility] [phone]     Karlton Lemon 11/07/2019, 5:08 PM

## 2019-11-07 NOTE — Plan of Care (Signed)

## 2019-11-07 NOTE — Progress Notes (Addendum)
Attempted to drain pleurx catheter.  Thick bloody pleural fluid returned, approximately 55-60 ml.  Flushed catheter with no significant return.  New dressing applied.  Concerned that the catheter is patent but fluid will not drain.     Noe Gens, MSN, NP-C [place] [facility] 11/07/2019, 11:49 AM   Please see [email or web address] for pager details.

## 2019-11-07 NOTE — Progress Notes (Signed)
PROGRESS NOTE    Autumn Cook  IPJ:[id number] DOB: [birth year] DOA: 10/16/2019 PCP: Darreld Mclean, MD  Brief Narrative:  59 year old African-American female Recent hospitalization 10/09/2019-10/14/2019 declared stage IV metastatic breast cancer-at that admission pleural tap was done Pleurx catheter was placed Patient was discharged home to follow-up with her oncologist on 6/9 but for some reason she did not get home health no one showed up to sure how to drain the Pleurx She went to [facility] on 6/10 with short of breath found to have complete opacification left hemithorax-she was treated as if she had an HCAP and admitted to the hospital for management of the same  She also carries diagnosis of HTN HLD DM TY 2 asthma Pulmonology consulted saw the patient felt she had elements of trapped lung therefore not a candidate for pleurodesis Palliative care saw the patient also in consult to assist with pain control Attempts were made at draining the Pleurx catheter on 7/2 unsuccessfully and pulmonology is determining next steps  Assessment & Plan:   Active Problems:   Hypertension   History of tobacco use   Hypercholesterolemia   Malignant neoplasm of overlapping sites of right breast in female, estrogen receptor positive ([place])   Encounter for central line placement   Malignant pleural effusion   Pleural effusion on left   Metastases to the liver [facility])   Acute respiratory failure (HCC)   Diabetes mellitus type 2, uncontrolled, with complications (HCC)   Acute respiratory failure with hypoxia (HCC)   Pneumonia of right lower lobe due to infectious organism   Generalized pain   General weakness   Palliative care by specialist   Cancer associated pain   Therapeutic opioid induced constipation  1. Metastatic stage IV breast cancer with large left-sided effusion a. Status post lumpectomy 12/2018 with neoadjuvant treatment as guided by 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 Defer Pleurx management and or further drain placement to pulmonology who have attempted to use the drain as per their notes on 7/2 c. Holding at this time Xeloda 2. Cancer related pain a. Appreciate palliative input- b. Continuing fentanyl 37.5 every 72, tramadol 75 every 6 as needed, morphine injection 2 mg every 3 as needed for severe breakthrough pain 3. Diabetes mellitus type 2 A1c 7.8 a. No meds currently other than sliding scale-sugars ranging from 1 11-1 33 4. HTN a. Continue atenolol 100 daily--PTA Zestoretic on hold b. As needed Lopressor 5 mg every 8 for heart rate above 105 hold for MAP below 60 5. Asymptomatic bacteriuria a. Multiple colony-forming units 6/24-no further coverage at this time 6. HCAP suspected on admission likely more related to fluid a. Has been on cefepime since 6/25-curtail antibiotics at the 7-day mark 11/06/2019 and will DC today and observe b. White count has improved significantly  DVT prophylaxis:  Code Status: Full Family Communication: Discussed with daughter bedside  Disposition:   Status is: Inpatient  Remains inpatient appropriate because:Altered mental status, Unsafe d/c plan, IV treatments appropriate due to intensity of illness or inability to take PO and Inpatient level of care appropriate due to severity of illness   Dispo: The patient is from: Home              Anticipated d/c is to: Home              Anticipated d/c date is: 3 days              Patient  currently is not medically stable to d/c.  Consultants:   Pulmonology  Oncology  Procedures: Multiple attempts at fluid drainage  Antimicrobials: Cefepime 6/20 5127/2  Subjective: Sleepier and somnolent Not a good day witht he chemo today   Objective: Vitals:   11/07/19 0907 11/07/19 0914 11/07/19 1013 11/07/19 1336  BP:   116/83 121/64  Pulse: 83  85 79  Resp: 16   18  Temp:    98.2 F (36.8 C)  TempSrc:    Oral  SpO2: 100% 99%   100%  Weight:      Height:        Intake/Output Summary (Last 24 hours) at 11/07/2019 1655 Last data filed at 11/07/2019 1400 Gross per 24 hour  Intake 385 ml  Output --  Net 385 ml   Filed Weights   10/16/19 0431 10/27/19 1854 10/28/19 0406  Weight: 80.3 kg 83.1 kg 81.3 kg    Examination:  General exam:no distress Respiratory system: Decreased air entry left side Cardiovascular system: S1-S2 no murmur slightly tachycardic Gastrointestinal system: Soft nontender no organomegaly no rebound. Central nervous system: Intact although appears at times sleepy Extremities: No lower extremity edema Skin: No rash Psychiatry: flat  Data Reviewed: I have personally reviewed following labs and imaging studies Hemoglobin 8.6--8.2 platelet 484  Radiology Studies: No results found.   Scheduled Meds: . acetaminophen  500 mg Oral TID WC  . atenolol  100 mg Oral Daily  . capecitabine  1,500 mg Oral BID PC  . enoxaparin (LOVENOX) injection  40 mg Subcutaneous Q24H  . feeding supplement (ENSURE ENLIVE)  237 mL Oral TID BM  . feeding supplement (PRO-STAT SUGAR FREE 64)  30 mL Oral BID  . fentaNYL  1 patch Transdermal Q72H  . fentaNYL  1 patch Transdermal Q72H  . hydrocortisone cream   Topical BID  . insulin aspart  0-15 Units Subcutaneous Q4H  . magic mouthwash w/lidocaine  5 mL Oral QID  . mouth rinse  15 mL Mouth Rinse BID  . nystatin  5 mL Oral QID  . polyethylene glycol  17 g Oral BID  . promethazine  12.5 mg Oral Q6H  . senna  2 tablet Oral BID  . sucralfate  1 g Oral TID WC & HS   Continuous Infusions:   LOS: 22 days   Time spent: [place], MD [facility] To contact the attending provider between 7A-7P or the covering provider during after hours 7P-7A, please log into the web site [email or web address] and access using universal [place] password for that web site. If you do not have the password, please call the hospital operator.  11/07/2019, 4:55 PM

## 2019-11-07 NOTE — Progress Notes (Signed)
Visitors are at four. Only two may visit at a time. Morey Hummingbird, Czarian Mcgregor, Leta Baptist and [employer]. Visitors may stay at night per management.

## 2019-11-07 NOTE — Progress Notes (Signed)
[facility] Follow Up Note   S: Patients daughters at bedside.  Report she has had a tiresome day with chemotherapy.  Received ultram early am.  Sleeping currently   O: Blood pressure 116/83, pulse 85, temperature 98.7 F (37.1 C), temperature source Oral, resp. rate 16, height 5\' 6"  (1.676 m), weight 81.3 kg, last menstrual period 11/14/2013, SpO2 99 %.   General: adult female lying in bed in NAD HEENT: MM pink/moist Neuro: Awakens to voice, speech clear, drifts back to sleep  CV: s1s2 rrr, no m/r/g PULM: non-labored, bronchial breath sounds on left, clear on right, left chest pleurx GI: soft, bsx4 active  Extremities: warm/dry, no edema  Skin: no rashes or lesions   A: Metastatic Breast Cancer Malignant Pleural Effusion  Left Chest Pain in setting of Breast Cancer    P: Attempted to drain pleurx after intrapleural tPA, unfortunately only minimal amount of fluid returned (suspect what returned was what was just recently instilled in space - tPA + flush).  Pleural drainage bloody and thick.  Reviewed with daughters that the tube is patent but pleural fluid is not draining.  Will discuss with MD regarding next steps > remove pleurx vs repeat tPA vs different chest drainage.  I am not sure based on fluid appearance that another chest tube would be able to drain.  She did tolerate the drainage process well / did not seem to increase her pain with pleurx drainage system. Family questions answered at bedside  Pain control per primary / palliative care  Chemotherapy per Dr. Marney Setting, MSN, NP-C [facility] 11/07/2019, 12:24 PM   Please see [email or web address] for pager details.

## 2019-11-07 NOTE — Progress Notes (Signed)
Daily Progress Note   Patient Name: Autumn Cook       Date: 11/07/2019 DOB: 1960-07-07  Age: 59 y.o. MRN#: [id number] Attending Physician: Nita Sells, MD Primary Care Physician: Darreld Mclean, MD Admit Date: 10/16/2019  Reason for Consultation/Follow-up: Non pain symptom management and Pain control  Subjective: I met with Autumn Cook and Autumn 2 daughters today.  We discussed symptom management.  She reports that currently she is not having any pain but did have a little increase in Autumn nausea today.  She then stated, "I do not want to die."  I asked Autumn to tell me more about that and she stated that she has had some dissociative feelings from herself and has not been feeling right throughout the day today.  Autumn Cook reports that it may have gotten worse when he began to have discussions with Autumn about care plan moving forward in regards to Pleurx tube and options for care that were reviewed with them by critical care team.  We discussed things that are most important to Autumn moving forward including seeing Autumn sons and having as good of symptom management as possible.  We also discussed Autumn desire for care in the event that Autumn condition decompensates.  Discussed that the goal of any medical intervention is to allow Autumn to continue to spend quality time with Autumn family.  We also discussed my concern that if she suffered from cardiac or respiratory arrest, heroic interventions in that situation would not be a likely pathway to being able to meaningfully spend time with family.  She listened to reasoning and options regarding consideration for placing limitations on Autumn care including consideration for not attempting resuscitation in the event of natural death.  She seemed anxious  during this discussion and stated that, "since I have never had them before, I would probably like to at least try."  Autumn Cook then asked to clarify exactly what Autumn wishes would be if Autumn condition were to worsen and she were to stop breathing on Autumn heart were to stop.  Autumn Cook stated that she would want to transition to ICU, be placed on mechanical ventilation if needed, and receive CPR in the event of cardiac arrest.  I told Autumn that we would continue to revisit both Autumn short and long-term goals based  upon Autumn continued clinical course and that these wishes often change based upon how she is doing and likely outcomes of Autumn care moving forward.  She was agreeable to this.  I later saw Autumn daughters in the hall and they expressed understanding Autumn situation and plan for Autumn care overnight.  They also understand we will need to gain further direction about next steps regarding Pleurx catheter and trapped lung in light of Autumn advanced cancer.   Length of Stay: 22  Current Medications: Scheduled Meds:  . acetaminophen  500 mg Oral TID WC  . atenolol  100 mg Oral Daily  . capecitabine  1,500 mg Oral BID PC  . enoxaparin (LOVENOX) injection  40 mg Subcutaneous Q24H  . feeding supplement (ENSURE ENLIVE)  237 mL Oral TID BM  . feeding supplement (PRO-STAT SUGAR FREE 64)  30 mL Oral BID  . fentaNYL  1 patch Transdermal Q72H  . fentaNYL  1 patch Transdermal Q72H  . hydrocortisone cream   Topical BID  . insulin aspart  0-15 Units Subcutaneous Q4H  . magic mouthwash w/lidocaine  5 mL Oral QID  . mouth rinse  15 mL Mouth Rinse BID  . nystatin  5 mL Oral QID  . polyethylene glycol  17 g Oral BID  . promethazine  12.5 mg Oral Q6H  . senna  2 tablet Oral BID  . sucralfate  1 g Oral TID WC & HS   PRN Meds: antiseptic oral rinse, ipratropium-albuterol, levalbuterol, LORazepam, metoprolol tartrate, morphine injection, prochlorperazine **OR** prochlorperazine, sorbitol, traMADol  Physical  Exam     General: Awake,  in no acute distress.  Heart: Regular rate and rhythm.  Abdomen: non distended.  Ext: No significant edema Skin: Warm and dry      Vital Signs: BP 121/64 (BP Location: Left Arm)   Pulse 79   Temp 98.2 F (36.8 C) (Oral)   Resp 18   Ht _0  (1.676 m)   Wt 81.3 kg   LMP 11/14/2013   SpO2 100%   BMI 28.94 kg/m  SpO2: SpO2: 100 % O2 Device: O2 Device: Nasal Cannula O2 Flow Rate: O2 Flow Rate (L/min): 2 L/min  Intake/output summary:   Intake/Output Summary (Last 24 hours) at 11/07/2019 1852 Last data filed at 11/07/2019 1400 Gross per 24 hour  Intake 385 ml  Output --  Net 385 ml   LBM: Last BM Date: 11/05/19 Baseline Weight: Weight: 80.3 kg Most recent weight: Weight: 81.3 kg       Palliative Assessment/Data: Flowsheet Rows     Most Recent Value  Intake Tab  Referral Department Hospitalist  Unit at Time of Referral Other (Comment)  [urology/telementry]  Palliative Care Primary Diagnosis Cancer  Date Notified 10/20/19  Palliative Care Type New Palliative care  Reason for referral Pain, Non-pain Symptom  Date of Admission 10/16/19  Date first seen by Palliative Care 10/20/19  # of days Palliative referral response time 0 Day(s)  # of days IP prior to Palliative referral 4  Clinical Assessment  Psychosocial & Spiritual Assessment  Palliative Care Outcomes     Patient Active Problem List   Diagnosis Date Noted  . Cancer associated pain   . Therapeutic opioid induced constipation   . Generalized pain   . General weakness   . Palliative care by specialist   . Acute respiratory failure ([place]) 10/16/2019  . Diabetes mellitus type 2, uncontrolled, with complications ([place]) 62/94/7654  . Acute respiratory failure with hypoxia ([place]) 10/16/2019  .  Pneumonia of right lower lobe due to infectious organism 10/16/2019  . Metastases to the liver ([place]) 10/10/2019  . 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 Pleural effusion on left 10/09/2019  .  Type 2 diabetes mellitus with hyperglycemia ([place]) 10/09/2019  . Encounter for central line placement 08/13/2018  . Port-A-Cath in place 08/07/2018  . Family history of lung cancer   . Malignant neoplasm of overlapping sites of right breast in female, estrogen receptor positive ([place]) 07/23/2018  . Achilles tendonitis, bilateral 06/11/2015  . Chest pain with moderate risk for cardiac etiology 10/13/2014  . Hypercholesterolemia 10/13/2014  . Idiopathic scoliosis 08/18/2014  . Hypertension 09/11/2012  . Overweight (BMI 25.0-29.9) 09/11/2012  . Migraine headache 09/11/2012  . Hx of thyroid nodule 09/11/2012  . History of tobacco use 09/11/2012    Palliative Care Assessment & Plan   Patient Profile: 59 year old female with metastatic breast cancer under the care of [name].  She is undergoing chemotherapy and radiation and is status post Pleurx catheter placement with cytology revealing malignant cells.  Currently admitted for discomfort and shortness of breath and pulmonary working to increase drainage from Pleurx catheter.  Plan on discharge she is for follow-up with [name] for further treatment with capecitabine.  Palliative following for pain management.  Recommendations/Plan: -Full CODE STATUS.  This was discussed again with Ms. Friebel this evening. -Family discussed options for Pleurx catheter management with PCCM today.  They are considering options reviewed with him by Dr. Ander Slade. -PMT will continue to follow holistically  Goals of Care and Additional Recommendations:  Limitations on Scope of Treatment: Full Scope Treatment  Code Status:    Code Status Orders  (From admission, onward)         Start     Ordered   10/16/19 0803  Full code  Continuous        10/16/19 0808        Code Status History    Date Active Date Inactive Code Status Order ID Comments User Context   10/09/2019 2245 10/14/2019 2035 Full Code [id number]  Rise Patience, MD Inpatient    Advance Care Planning Activity       Prognosis:   Unable to determine  Discharge Planning:  Home with [facility] vs SNF for rehab  Care plan was discussed with primary RN, patient, Cook  Thank you for allowing the Palliative Medicine Team to assist in the care of this patient.    Total Time 40 Prolonged Time Billed No   Greater than 50%  of this time was spent counseling and coordinating care related to the above assessment and plan. Micheline Rough, MD    Please contact Palliative Medicine Team phone at [phone] for questions and concerns.

## 2019-11-08 LAB — CBC WITH DIFFERENTIAL/PLATELET
Abs Immature Granulocytes: 0.39 10*3/uL — ABNORMAL HIGH (ref 0.00–0.07)
Basophils Absolute: 0 10*3/uL (ref 0.0–0.1)
Basophils Relative: 0 %
Eosinophils Absolute: 0.1 10*3/uL (ref 0.0–0.5)
Eosinophils Relative: 1 %
HCT: 27.3 % — ABNORMAL LOW (ref 36.0–46.0)
Hemoglobin: 8.4 g/dL — ABNORMAL LOW (ref 12.0–15.0)
Immature Granulocytes: 4 %
Lymphocytes Relative: 9 %
Lymphs Abs: 1 10*3/uL (ref 0.7–4.0)
MCH: 27.9 pg (ref 26.0–34.0)
MCHC: 30.8 g/dL (ref 30.0–36.0)
MCV: 90.7 fL (ref 80.0–100.0)
Monocytes Absolute: 0.5 10*3/uL (ref 0.1–1.0)
Monocytes Relative: 4 %
Neutro Abs: 8.8 10*3/uL — ABNORMAL HIGH (ref 1.7–7.7)
Neutrophils Relative %: 82 %
Platelets: 489 10*3/uL — ABNORMAL HIGH (ref 150–400)
RBC: 3.01 MIL/uL — ABNORMAL LOW (ref 3.87–5.11)
RDW: 15.7 % — ABNORMAL HIGH (ref 11.5–15.5)
WBC: 10.7 10*3/uL — ABNORMAL HIGH (ref 4.0–10.5)
nRBC: 0.7 % — ABNORMAL HIGH (ref 0.0–0.2)

## 2019-11-08 LAB — GLUCOSE, CAPILLARY
Glucose-Capillary: 116 mg/dL — ABNORMAL HIGH (ref 70–99)
Glucose-Capillary: 137 mg/dL — ABNORMAL HIGH (ref 70–99)
Glucose-Capillary: 169 mg/dL — ABNORMAL HIGH (ref 70–99)
Glucose-Capillary: 157 mg/dL — ABNORMAL HIGH (ref 70–99)
Glucose-Capillary: 166 mg/dL — ABNORMAL HIGH (ref 70–99)

## 2019-11-08 NOTE — Progress Notes (Signed)
Daily Progress Note   Patient Name: Autumn Cook       Date: 11/08/2019 DOB: 1961/01/25  Age: 59 y.o. MRN#: [id number] Attending Physician: Nita Sells, MD Primary Care Physician: Darreld Mclean, MD Admit Date: 10/16/2019  Reason for Consultation/Follow-up: Non pain symptom management and Pain control  Subjective: Discussed options moving forward that were outlined by PCCM yesterday with daughter [name] in conjunction with Dr Verlon Au.  I then saw and examined Ms. Ngo.  She reports feeling better today and less anxious.  Discussed symptom management and she reports good control of pain and SOB at this time.  She reports knowing that she needs to make decisions about next steps moving forward and she is planning on discussing with [name] more this evening.    Length of Stay: 23  Current Medications: Scheduled Meds:   acetaminophen  500 mg Oral TID WC   atenolol  100 mg Oral Daily   capecitabine  1,500 mg Oral BID PC   enoxaparin (LOVENOX) injection  40 mg Subcutaneous Q24H   feeding supplement (ENSURE ENLIVE)  237 mL Oral TID BM   feeding supplement (PRO-STAT SUGAR FREE 64)  30 mL Oral BID   fentaNYL  1 patch Transdermal Q72H   fentaNYL  1 patch Transdermal Q72H   hydrocortisone cream   Topical BID   insulin aspart  0-15 Units Subcutaneous Q4H   magic mouthwash w/lidocaine  5 mL Oral QID   mouth rinse  15 mL Mouth Rinse BID   nystatin  5 mL Oral QID   polyethylene glycol  17 g Oral BID   promethazine  12.5 mg Oral Q6H   senna  2 tablet Oral BID   sucralfate  1 g Oral TID WC & HS   PRN Meds: antiseptic oral rinse, ipratropium-albuterol, levalbuterol, LORazepam, metoprolol tartrate, morphine injection, prochlorperazine **OR** prochlorperazine,  sorbitol, traMADol  Physical Exam     General: Awake,  in no acute distress.  Heart: Regular rate and rhythm.  Abdomen: non distended.  Ext: No significant edema Skin: Warm and dry      Vital Signs: BP 123/82 (BP Location: Left Arm)    Pulse 83    Temp (!) 97.5 F (36.4 C) (Oral)    Resp 16    Ht 5\' 6"  (1.676 m)    Wt  81.3 kg    LMP 11/14/2013    SpO2 100%    BMI 28.94 kg/m  SpO2: SpO2: 100 % O2 Device: O2 Device: Room Air O2 Flow Rate: O2 Flow Rate (L/min): 2 L/min  Intake/output summary:   Intake/Output Summary (Last 24 hours) at 11/08/2019 2202 Last data filed at 11/08/2019 1300 Gross per 24 hour  Intake 170 ml  Output 2 ml  Net 168 ml   LBM: Last BM Date: 11/05/19 Baseline Weight: Weight: 80.3 kg Most recent weight: Weight: 81.3 kg       Palliative Assessment/Data: Flowsheet Rows     Most Recent Value  Intake Tab  Referral Department Hospitalist  Unit at Time of Referral Other (Comment)  [urology/telementry]  Palliative Care Primary Diagnosis Cancer  Date Notified 10/20/19  Palliative Care Type New Palliative care  Reason for referral Pain, Non-pain Symptom  Date of Admission 10/16/19  Date first seen by Palliative Care 10/20/19  # of days Palliative referral response time 0 Day(s)  # of days IP prior to Palliative referral 4  Clinical Assessment  Psychosocial & Spiritual Assessment  Palliative Care Outcomes     Patient Active Problem List   Diagnosis Date Noted   Cancer associated pain    Therapeutic opioid induced constipation    Generalized pain    General weakness    Palliative care by specialist    Acute respiratory failure ([place]) 10/16/2019   Diabetes mellitus type 2, uncontrolled, with complications ([place]) 82/80/0349   Acute respiratory failure with hypoxia ([place]) 10/16/2019   Pneumonia of right lower lobe due to infectious organism 10/16/2019   Metastases to the liver ([place]) 10/10/2019   Malignant pleural effusion 10/09/2019   Pleural  effusion on left 10/09/2019   Type 2 diabetes mellitus with hyperglycemia ([place]) 10/09/2019   Encounter for central line placement 08/13/2018   Port-A-Cath in place 08/07/2018   Family history of lung cancer    Malignant neoplasm of overlapping sites of right breast in female, estrogen receptor positive ([place]) 07/23/2018   Achilles tendonitis, bilateral 06/11/2015   Chest pain with moderate risk for cardiac etiology 10/13/2014   Hypercholesterolemia 10/13/2014   Idiopathic scoliosis 08/18/2014   Hypertension 09/11/2012   Overweight (BMI 25.0-29.9) 09/11/2012   Migraine headache 09/11/2012   Hx of thyroid nodule 09/11/2012   History of tobacco use 09/11/2012    Palliative Care Assessment & Plan   Patient Profile: 59 year old female with metastatic breast cancer under the care of [name].  She is undergoing chemotherapy and radiation and is status post Pleurx catheter placement with cytology revealing malignant cells.  Currently admitted for discomfort and shortness of breath and pulmonary working to increase drainage from Pleurx catheter.  Plan on discharge she is for follow-up with [name] for further treatment with capecitabine.  Palliative following for pain management.  Recommendations/Plan: -FULL CODE -Family discussing options for care moving forward with Ms. Caprio.  Her daughter reports that she is trying to allow her mother time to process, but she also appreciates that decisions need to be made to allow direction for her care.  She is planning to discuss further with her mom this evening. -PMT will continue to follow holistically  Goals of Care and Additional Recommendations:  Limitations on Scope of Treatment: Full Scope Treatment  Code Status:    Code Status Orders  (From admission, onward)         Start     Ordered   10/16/19 0803  Full code  Continuous  10/16/19 4932        Code Status History    Date Active Date Inactive Code  Status Order ID Comments User Context   10/09/2019 2245 10/14/2019 2035 Full Code [id number]  Rise Patience, MD Inpatient   Advance Care Planning Activity       Prognosis:   Unable to determine  Discharge Planning:  Home with [facility] vs SNF for rehab  Care plan was discussed with primary RN, patient, daughter  Thank you for allowing the Palliative Medicine Team to assist in the care of this patient.    Total Time 25 Prolonged Time Billed No   Greater than 50%  of this time was spent counseling and coordinating care related to the above assessment and plan.  Micheline Rough, MD    Please contact Palliative Medicine Team phone at [phone] for questions and concerns.

## 2019-11-08 NOTE — Progress Notes (Signed)
PROGRESS NOTE  Autumn Cook  DXA:[id number] DOB: 07-Jun-1960 DOA: 10/16/2019 PCP: Darreld Mclean, MD  Brief Narrative:  59 year old African-American female Recent hospitalization 10/09/2019-10/14/2019 declared stage IV metastatic breast cancer-at that admission pleural tap was done Pleurx catheter was placed Patient was discharged home to follow-up with her oncologist on 6/9 but for some reason she did not get home health no one showed up to sure how to drain the Pleurx She went to [place] on 6/10 with short of breath found to have complete opacification left hemithorax-she was treated as if she had an HCAP and admitted to the hospital for management of the same  She also carries diagnosis of HTN HLD DM TY 2 asthma Pulmonology consulted saw the patient felt she had elements of trapped lung therefore not a candidate for pleurodesis Palliative care saw the patient also in consult to assist with pain control Attempts were made at draining the Pleurx catheter on 7/2 unsuccessfully and pulmonology is determining next steps  Assessment & Plan:   Active Problems:   Hypertension   History of tobacco use   Hypercholesterolemia   Malignant neoplasm of overlapping sites of right breast in female, estrogen receptor positive ([place])   Encounter for central line placement   Malignant pleural effusion   Pleural effusion on left   Metastases to the liver [facility])   Acute respiratory failure (HCC)   Diabetes mellitus type 2, uncontrolled, with complications (HCC)   Acute respiratory failure with hypoxia (HCC)   Pneumonia of right lower lobe due to infectious organism   Generalized pain   General weakness   Palliative care by specialist   Cancer associated pain   Therapeutic opioid induced constipation  1. Metastatic stage IV breast cancer with large left-sided effusion a. Status post lumpectomy 12/2018 with neoadjuvant treatment as guided by 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 Defer Pleurx management and or further drain placement to pulmonology ---family and patient will decide on options and I have printed out the options and given it to family for them to decide on this over the next 24 to 48 hours c. Still on Xeloda daily which I will ask an opinion of oncology tomorrow with regards to continuation versus not 2. Cancer related pain a. Appreciate palliative input b. Continuing fentanyl 37.5 every 72, tramadol 75 every 6 as needed, morphine injection 2 mg every 3 as needed for severe breakthrough pain c. No changes for the past several days 3. Diabetes mellitus type 2 A1c 7.8 a. No meds currently other than sliding scale-sugars ranging from 1 57-1 69 4. HTN a. Continue atenolol 100 daily--PTA Zestoretic on hold b. As needed Lopressor 5 mg every 8 for heart rate above 105 hold for MAP below 5. Secondary to opiates constipation a. Continue MiraLAX, senna, sorbitol as needed 6. Reflux a. Continue Carafate, 7. Asymptomatic bacteriuria a. Multiple colony-forming units 6/24-no further coverage at this time 8. HCAP suspected on admission likely more related to fluid a. Has been on cefepime since 6/25-through 7/1 b. White count slightly up but no fevers monitor trends  DVT prophylaxis:  Code Status: Full Family Communication: Discussed with daughter bedside  Disposition:   Status is: Inpatient  Remains inpatient appropriate because:Altered mental status, Unsafe d/c plan, IV treatments appropriate due to intensity of illness or inability to take PO and Inpatient level of care appropriate due to severity of illness   Dispo: The patient is from: Home  Anticipated d/c is to: Home              Anticipated d/c date is: 3 days              Patient currently is not medically stable to d/c.  Consultants:   Pulmonology  Oncology  Procedures: Multiple attempts at fluid drainage  Antimicrobials: Cefepime 6/20  5127/2  Subjective: Awakens well No complaints really On oxygen Did not eat well today-has been somnolent until yesterday and she had a big day yesterday with all the drain manipulation and trials at drainage of her Pleurx  Objective: Vitals:   11/07/19 2046 11/08/19 0652 11/08/19 1116 11/08/19 1203  BP: 120/75 115/78 131/80 129/83  Pulse: 84 82 85 86  Resp: 19 20 16 13   Temp: 98.2 F (36.8 C)   98.2 F (36.8 C)  TempSrc: Oral   Oral  SpO2: 100% 99% 100% 100%  Weight:      Height:        Intake/Output Summary (Last 24 hours) at 11/08/2019 1637 Last data filed at 11/08/2019 1300 Gross per 24 hour  Intake 290 ml  Output 2 ml  Net 288 ml   Filed Weights   10/16/19 0431 10/27/19 1854 10/28/19 0406  Weight: 80.3 kg 83.1 kg 81.3 kg    Examination:  General exam:no distress Respiratory system: Decreased air entry left side Cardiovascular system: S1-S2 no murmur slightly tachycardic Gastrointestinal system: Soft nontender no organomegaly no rebound. Central nervous system: I Sleepy Psychiatry: flat  Data Reviewed: I have personally reviewed following labs and imaging studies  White count 10.7 hemoglobin 8.4 platelet 489 No chemistries today  Radiology Studies: No results found.   Scheduled Meds: . acetaminophen  500 mg Oral TID WC  . atenolol  100 mg Oral Daily  . capecitabine  1,500 mg Oral BID PC  . enoxaparin (LOVENOX) injection  40 mg Subcutaneous Q24H  . feeding supplement (ENSURE ENLIVE)  237 mL Oral TID BM  . feeding supplement (PRO-STAT SUGAR FREE 64)  30 mL Oral BID  . fentaNYL  1 patch Transdermal Q72H  . fentaNYL  1 patch Transdermal Q72H  . hydrocortisone cream   Topical BID  . insulin aspart  0-15 Units Subcutaneous Q4H  . magic mouthwash w/lidocaine  5 mL Oral QID  . mouth rinse  15 mL Mouth Rinse BID  . nystatin  5 mL Oral QID  . polyethylene glycol  17 g Oral BID  . promethazine  12.5 mg Oral Q6H  . senna  2 tablet Oral BID  . sucralfate  1 g  Oral TID WC & HS   Continuous Infusions:   LOS: 23 days   Time spent: [place], MD [facility] To contact the attending provider between 7A-7P or the covering provider during after hours 7P-7A, please log into the web site [email or web address] and access using universal [place] password for that web site. If you do not have the password, please call the hospital operator.  11/08/2019, 4:37 PM

## 2019-11-08 NOTE — Progress Notes (Deleted)
Incorrect entry, wrong patient

## 2019-11-09 ENCOUNTER — Inpatient Hospital Stay (HOSPITAL_COMMUNITY): Payer: 59

## 2019-11-09 DIAGNOSIS — M79609 Pain in unspecified limb: Secondary | ICD-10-CM

## 2019-11-09 DIAGNOSIS — M7989 Other specified soft tissue disorders: Secondary | ICD-10-CM

## 2019-11-09 LAB — CBC WITH DIFFERENTIAL/PLATELET
Abs Immature Granulocytes: 0.24 10*3/uL — ABNORMAL HIGH (ref 0.00–0.07)
Basophils Absolute: 0 10*3/uL (ref 0.0–0.1)
Basophils Relative: 0 %
Eosinophils Absolute: 0 10*3/uL (ref 0.0–0.5)
Eosinophils Relative: 0 %
HCT: 26.6 % — ABNORMAL LOW (ref 36.0–46.0)
Hemoglobin: 8.3 g/dL — ABNORMAL LOW (ref 12.0–15.0)
Immature Granulocytes: 2 %
Lymphocytes Relative: 8 %
Lymphs Abs: 1 10*3/uL (ref 0.7–4.0)
MCH: 27.8 pg (ref 26.0–34.0)
MCHC: 31.2 g/dL (ref 30.0–36.0)
MCV: 89 fL (ref 80.0–100.0)
Monocytes Absolute: 0.7 10*3/uL (ref 0.1–1.0)
Monocytes Relative: 6 %
Neutro Abs: 9.5 10*3/uL — ABNORMAL HIGH (ref 1.7–7.7)
Neutrophils Relative %: 84 %
Platelets: 403 10*3/uL — ABNORMAL HIGH (ref 150–400)
RBC: 2.99 MIL/uL — ABNORMAL LOW (ref 3.87–5.11)
RDW: 15.6 % — ABNORMAL HIGH (ref 11.5–15.5)
WBC: 11.4 10*3/uL — ABNORMAL HIGH (ref 4.0–10.5)
nRBC: 0.5 % — ABNORMAL HIGH (ref 0.0–0.2)

## 2019-11-09 LAB — GLUCOSE, CAPILLARY
Glucose-Capillary: 162 mg/dL — ABNORMAL HIGH (ref 70–99)
Glucose-Capillary: 151 mg/dL — ABNORMAL HIGH (ref 70–99)
Glucose-Capillary: 172 mg/dL — ABNORMAL HIGH (ref 70–99)
Glucose-Capillary: 162 mg/dL — ABNORMAL HIGH (ref 70–99)
Glucose-Capillary: 209 mg/dL — ABNORMAL HIGH (ref 70–99)

## 2019-11-09 LAB — BASIC METABOLIC PANEL
Anion gap: 11 (ref 5–15)
BUN: 60 mg/dL — ABNORMAL HIGH (ref 6–20)
CO2: 21 mmol/L — ABNORMAL LOW (ref 22–32)
Calcium: 9.2 mg/dL (ref 8.9–10.3)
Chloride: 109 mmol/L (ref 98–111)
Creatinine, Ser: 1.23 mg/dL — ABNORMAL HIGH (ref 0.44–1.00)
GFR calc Af Amer: 56 mL/min — ABNORMAL LOW (ref >60)
GFR calc non Af Amer: 49 mL/min — ABNORMAL LOW (ref >60)
Glucose, Bld: 153 mg/dL — ABNORMAL HIGH (ref 70–99)
Potassium: 3.8 mmol/L (ref 3.5–5.1)
Sodium: 141 mmol/L (ref 135–145)

## 2019-11-09 LAB — HEPARIN LEVEL (UNFRACTIONATED): Heparin Unfractionated: 0.64 IU/mL (ref 0.30–0.70)

## 2019-11-09 MED ORDER — HEPARIN (PORCINE) 25000 UT/250ML-% IV SOLN
1050.0000 [IU]/h | INTRAVENOUS | Status: DC
  Administered 2019-11-09: 1100 [IU]/h via INTRAVENOUS
  Filled 2019-11-09: qty 250

## 2019-11-09 NOTE — Progress Notes (Addendum)
Texted MD to make him aware of Preliminary vascular of left arm report (final to follow) and CT report ready for review. SRP, RN

## 2019-11-09 NOTE — Progress Notes (Signed)
PROGRESS NOTE  Autumn Cook  BUL:[id number] DOB: 26-Dec-1960 DOA: 10/16/2019 PCP: Darreld Mclean, MD  Brief Narrative:  59 year old African-American female Recent hospitalization 10/09/2019-10/14/2019 declared stage IV metastatic breast cancer-at that admission pleural tap was done Pleurx catheter was placed Patient was discharged home to follow-up with her oncologist on 6/9 but for some reason she did not get home health no one showed up to sure how to drain the Pleurx She went to [place] on 6/10 with short of breath found to have complete opacification left hemithorax-she was treated as if she had an HCAP and admitted to the hospital for management of the same  She also carries diagnosis of HTN HLD DM TY 2 asthma Pulmonology consulted saw the patient felt she had elements of trapped lung therefore not a candidate for pleurodesis Palliative care saw the patient also in consult to assist with pain control Attempts were made at draining the Pleurx catheter on 7/2 unsuccessfully Plan is to get a CT of the chest to determine if further procedures are possible Also ruling out right upper extremity DVT  Assessment & Plan:   Active Problems:   Hypertension   History of tobacco use   Hypercholesterolemia   Malignant neoplasm of overlapping sites of right breast in female, estrogen receptor positive ([place])   Encounter for central line placement   Malignant pleural effusion   Pleural effusion on left   Metastases to the liver [facility])   Acute respiratory failure (HCC)   Diabetes mellitus type 2, uncontrolled, with complications (HCC)   Acute respiratory failure with hypoxia (HCC)   Pneumonia of right lower lobe due to infectious organism   Generalized pain   General weakness   Palliative care by specialist   Cancer associated pain   Therapeutic opioid induced constipation  1. Metastatic stage IV breast cancer with large left-sided effusion a. Status post lumpectomy 12/2018 with  neoadjuvant treatment as guided by 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 Defer Pleurx management and or further drain placement to pulmonology ---family and patient have decided on getting a CT chest to see if a palliative thoracentesis can be performed c. Pulmonology has seen the patient and are recommending hospice  d. Continuing Xeloda 2. Cancer related pain a. Appreciate palliative input b. Continuing fentanyl 37.5 every 72, tramadol 75 every 6 as needed, morphine injection 2 mg every 3 as needed for severe breakthrough pain c. No changes for the past several days 3. Diabetes mellitus type 2 A1c 7.8 a. No meds currently other than sliding scale-sugars ranging from 1 51-1 72 4. HTN a. Continue atenolol 100 daily--PTA Zestoretic on hold b. As needed Lopressor 5 mg every 8 for heart rate above 105 hold for MAP below 5. Secondary to opiates constipation a. Continue MiraLAX, senna, sorbitol as needed b. Passing good stool  c. will start low-dose IV fluid if creatinine rises beyond current trends after ultrasound of upper extremity 6. Reflux a. Continue Carafate, 7. Asymptomatic bacteriuria a. Multiple colony-forming units 6/24-no further coverage at this time 8. HCAP suspected on admission likely more related to fluid a. Has been on cefepime since 6/25-through 7/1 b. White count slightly up but no fevers monitor trends  DVT prophylaxis:  Code Status: Full Family Communication: Discussed with daughter bedside  Disposition:   Status is: Inpatient  Remains inpatient appropriate because:Altered mental status, Unsafe d/c plan, IV treatments appropriate due to intensity of illness or inability to take PO and Inpatient  level of care appropriate due to severity of illness   Dispo: The patient is from: Home              Anticipated d/c is to: Home              Anticipated d/c date is: 3 days              Patient currently is not medically stable to  d/c.  Consultants:   Pulmonology  Oncology  Procedures: Multiple attempts at fluid drainage  Antimicrobials: Cefepime 6/20 5127/2  Subjective: Awakens well Complaining of left upper extremity discomfort pain and swelling since yesterday with swelling of entire arm No chest pain Overall pain is controlled Eating better today than past several days had a croissant and fruit and has been up and out of bed  Objective: Vitals:   11/08/19 2034 11/09/19 0459 11/09/19 0508 11/09/19 1201  BP: 123/82 120/72  107/70  Pulse: 83 94  90  Resp: 16 18  19   Temp: (!) 97.5 F (36.4 C) 98.2 F (36.8 C)  97.7 F (36.5 C)  TempSrc: Oral Oral  Oral  SpO2: 100% 100%  100%  Weight:   78.6 kg   Height:        Intake/Output Summary (Last 24 hours) at 11/09/2019 1256 Last data filed at 11/08/2019 1300 Gross per 24 hour  Intake 120 ml  Output --  Net 120 ml   Filed Weights   10/27/19 1854 10/28/19 0406 11/09/19 0508  Weight: 83.1 kg 81.3 kg 78.6 kg    Examination:  General exam:no distress-sleepy but arousable at times Respiratory system: Decreased air entry left side Cardiovascular system: S1-S2 no murmur slightly tachycardic Gastrointestinal system: Soft nontender no organomegaly no rebound. Central nervous system: Intact moving all 4 limbs Psychiatry: flat  Data Reviewed: I have personally reviewed following labs and imaging studies  BUNs/creatinine up to 60/1.2 White count 11.4 platelet 403  Radiology Studies: No results found.   Scheduled Meds: . acetaminophen  500 mg Oral TID WC  . atenolol  100 mg Oral Daily  . capecitabine  1,500 mg Oral BID PC  . enoxaparin (LOVENOX) injection  40 mg Subcutaneous Q24H  . feeding supplement (ENSURE ENLIVE)  237 mL Oral TID BM  . feeding supplement (PRO-STAT SUGAR FREE 64)  30 mL Oral BID  . fentaNYL  1 patch Transdermal Q72H  . fentaNYL  1 patch Transdermal Q72H  . hydrocortisone cream   Topical BID  . insulin aspart  0-15 Units  Subcutaneous Q4H  . magic mouthwash w/lidocaine  5 mL Oral QID  . mouth rinse  15 mL Mouth Rinse BID  . nystatin  5 mL Oral QID  . polyethylene glycol  17 g Oral BID  . promethazine  12.5 mg Oral Q6H  . senna  2 tablet Oral BID  . sucralfate  1 g Oral TID WC & HS   Continuous Infusions:   LOS: 24 days   Time spent: [place], MD [facility] To contact the attending provider between 7A-7P or the covering provider during after hours 7P-7A, please log into the web site [email or web address] and access using universal [place] password for that web site. If you do not have the password, please call the hospital operator.  11/09/2019, 12:56 PM

## 2019-11-09 NOTE — Progress Notes (Signed)
Left upper extremity venous duplex has been completed. Preliminary results can be found in CV Proc through chart review.  Results were given to the patient's nurse, Sophia.  11/09/19 3:50 PM Carlos Levering RVT

## 2019-11-09 NOTE — Progress Notes (Signed)
Pt c/o of left arm pain, left arm edematous from finger tip to elbow with small tender redden area on lateral side of arm. SRP, RN

## 2019-11-09 NOTE — Progress Notes (Signed)
[place] [facility]  Subjective:  Drowsy, just received cancer treatment No complaints of dyspnea  On exam:  Vitals:   11/08/19 1203 11/08/19 2034 11/09/19 0459 11/09/19 0508  BP: 129/83 123/82 120/72   Pulse: 86 83 94   Resp: 13 16 18    Temp: 98.2 F (36.8 C) (!) 97.5 F (36.4 C) 98.2 F (36.8 C)   TempSrc: Oral Oral Oral   SpO2: 100% 100% 100%   Weight:    78.6 kg  Height:        General:  Resting comfortably in bed HENT: NCAT OP clear PULM: No breath sounds on left, clear on right B, normal effort CV: RRR, no mgr GI: BS+, soft, nontender MSK: normal bulk and tone Neuro: drowsy, wakes up to voice, answers questions coherently while awake   CBC    Component Value Date/Time   WBC 11.4 (H) 11/09/2019 0556   RBC 2.99 (L) 11/09/2019 0556   HGB 8.3 (L) 11/09/2019 0556   HGB 12.9 07/24/2018 0825   HCT 26.6 (L) 11/09/2019 0556   PLT 403 (H) 11/09/2019 0556   PLT 342 07/24/2018 0825   MCV 89.0 11/09/2019 0556   MCV 86.8 12/06/2011 1042   MCH 27.8 11/09/2019 0556   MCHC 31.2 11/09/2019 0556   RDW 15.6 (H) 11/09/2019 0556   LYMPHSABS 1.0 11/09/2019 0556   MONOABS 0.7 11/09/2019 0556   EOSABS 0.0 11/09/2019 0556   BASOSABS 0.0 11/09/2019 0556     Impression: Metastatic breast cancer Malignant pleural effusion> thick, doesn't drain well despite TPA Severe pain on left chest due to breast cancer  Discussion/Plan I spoke to [place] primarily today because her mother was drowsy from medications received earlier.  She says that her mother understands her poor prognosis but is not willing to move towards full comfort measures yet.  She is still willing to "do anything possible", including further fluid drainage.  I explained that this is very likely to be unsuccessful given the lack of fluid removal after instilling TPA earlier this week.  A second chest tube would be too invasive and cause unecessary pain considering the high likelihood that the  fluid cannot be removed.  After some discussion we decided to perform another CT chest to evaluate the fluid to see if there appears to be another drainable pocket of fluid and if so attempt a palliative thoracentesis.    Roselie Awkward, MD [facility] Pager: [phone] Cell: [phone] If no response, call [phone]

## 2019-11-09 NOTE — Progress Notes (Signed)
Daily Progress Note   Patient Name: Autumn Cook       Date: 11/09/2019 DOB: 1961/04/26  Age: 59 y.o. MRN#: [id number] Attending Physician: Nita Sells, MD Primary Care Physician: Darreld Mclean, MD Admit Date: 10/16/2019  Reason for Consultation/Follow-up: Non pain symptom management and Pain control  Subjective: I met today with Ms. Battaglia and her daughter, [name], in conjunction with Dr. Verlon Au.  Discussed her clinical course over the past 24 hours with her daughter, [name]. She reports that she continues to attempt to follow her mother's expressed wishes for care, but [name] also realizes that we are looking at many problems that are not going to go away moving forward.  Plan is for CT to further evaluate her lung status and then to discuss options further with PCCM.  Only other new concern today is that her left are is swollen.  Dr. Verlon Au reviewed plan for [place] to evaluate.   Length of Stay: 24  Current Medications: Scheduled Meds:  . acetaminophen  500 mg Oral TID WC  . atenolol  100 mg Oral Daily  . capecitabine  1,500 mg Oral BID PC  . feeding supplement (ENSURE ENLIVE)  237 mL Oral TID BM  . feeding supplement (PRO-STAT SUGAR FREE 64)  30 mL Oral BID  . fentaNYL  1 patch Transdermal Q72H  . fentaNYL  1 patch Transdermal Q72H  . hydrocortisone cream   Topical BID  . insulin aspart  0-15 Units Subcutaneous Q4H  . magic mouthwash w/lidocaine  5 mL Oral QID  . mouth rinse  15 mL Mouth Rinse BID  . nystatin  5 mL Oral QID  . polyethylene glycol  17 g Oral BID  . promethazine  12.5 mg Oral Q6H  . senna  2 tablet Oral BID  . sucralfate  1 g Oral TID WC & HS   PRN Meds: antiseptic oral rinse, ipratropium-albuterol, levalbuterol, LORazepam, metoprolol tartrate,  morphine injection, prochlorperazine **OR** prochlorperazine, sorbitol, traMADol  Physical Exam     General: Awake,  in no acute distress.  Resp: Regular and non labored  Abdomen: non distended.  Ext: No significant edema Skin: Warm and dry Psych: Flat affect      Vital Signs: BP 107/70 (BP Location: Left Arm)   Pulse 90   Temp 97.7 F (36.5 C) (Oral)  Resp 19   Ht _0  (1.676 m)   Wt 78.6 kg   LMP 11/14/2013   SpO2 100%   BMI 27.96 kg/m  SpO2: SpO2: 100 % O2 Device: O2 Device: Nasal Cannula O2 Flow Rate: O2 Flow Rate (L/min): 2 L/min  Intake/output summary:   Intake/Output Summary (Last 24 hours) at 11/09/2019 1634 Last data filed at 11/09/2019 1620 Gross per 24 hour  Intake 480 ml  Output --  Net 480 ml   LBM: Last BM Date: 11/05/19 Baseline Weight: Weight: 80.3 kg Most recent weight: Weight: 78.6 kg       Palliative Assessment/Data: Flowsheet Rows     Most Recent Value  Intake Tab  Referral Department Hospitalist  Unit at Time of Referral Other (Comment)  [urology/telementry]  Palliative Care Primary Diagnosis Cancer  Date Notified 10/20/19  Palliative Care Type New Palliative care  Reason for referral Pain, Non-pain Symptom  Date of Admission 10/16/19  Date first seen by Palliative Care 10/20/19  # of days Palliative referral response time 0 Day(s)  # of days IP prior to Palliative referral 4  Clinical Assessment  Psychosocial & Spiritual Assessment  Palliative Care Outcomes     Patient Active Problem List   Diagnosis Date Noted  . Cancer associated pain   . Therapeutic opioid induced constipation   . Generalized pain   . General weakness   . Palliative care by specialist   . Acute respiratory failure ([place]) 10/16/2019  . Diabetes mellitus type 2, uncontrolled, with complications ([place]) 01/00/7121  . Acute respiratory failure with hypoxia ([place]) 10/16/2019  . Pneumonia of right lower lobe due to infectious organism 10/16/2019  . Metastases to the  liver ([place]) 10/10/2019  . 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 Pleural effusion on left 10/09/2019  . Type 2 diabetes mellitus with hyperglycemia ([place]) 10/09/2019  . Encounter for central line placement 08/13/2018  . Port-A-Cath in place 08/07/2018  . Family history of lung cancer   . Malignant neoplasm of overlapping sites of right breast in female, estrogen receptor positive ([place]) 07/23/2018  . Achilles tendonitis, bilateral 06/11/2015  . Chest pain with moderate risk for cardiac etiology 10/13/2014  . Hypercholesterolemia 10/13/2014  . Idiopathic scoliosis 08/18/2014  . Hypertension 09/11/2012  . Overweight (BMI 25.0-29.9) 09/11/2012  . Migraine headache 09/11/2012  . Hx of thyroid nodule 09/11/2012  . History of tobacco use 09/11/2012    Palliative Care Assessment & Plan   Patient Profile: 59 year old female with metastatic breast cancer under the care of [name].  She is undergoing chemotherapy and radiation and is status post Pleurx catheter placement with cytology revealing malignant cells.  Currently admitted for discomfort and shortness of breath and pulmonary working to increase drainage from Pleurx catheter.  Plan on discharge she is for follow-up with [name] for further treatment with capecitabine.  Palliative following for pain management.  Recommendations/Plan: -FULL CODE -Continued discussion with family regarding options for care moving froward.  She has elected to have CT chest for further evaluation and discussion of options with PCCM. -Reports constipation resolved.  As she is on chronic opioids would continue with senna.  She has been refusing scheduled miralax.  Will change this back to daily prn.  Continue to monitor.  - Pain currently fairly well controlled.  Plan to continue same.  Goals of Care and Additional Recommendations:  Limitations on Scope of Treatment: Full Scope Treatment  Code Status:    Code Status Orders  (From admission,  onward)  Start     Ordered   10/16/19 0803  Full code  Continuous        10/16/19 0808        Code Status History    Date Active Date Inactive Code Status Order ID Comments User Context   10/09/2019 2245 10/14/2019 2035 Full Code [id number]  Rise Patience, MD Inpatient   Advance Care Planning Activity       Prognosis:   Unable to determine  Discharge Planning:  Home with [facility] vs SNF for rehab  Care plan was discussed with primary RN, patient, daughter  Thank you for allowing the Palliative Medicine Team to assist in the care of this patient.    Total Time 20 Prolonged Time Billed No   Greater than 50%  of this time was spent counseling and coordinating care related to the above assessment and plan.  Micheline Rough, MD    Please contact Palliative Medicine Team phone at [phone] for questions and concerns.

## 2019-11-09 NOTE — Progress Notes (Addendum)
[place] for heparin IV Indication: DVT  Allergies  Allergen Reactions  . Penicillins Hives    Did it involve swelling of the face/tongue/throat, SOB, or low BP? Yes--syncope episode Did it involve sudden or severe rash/hives, skin peeling, or any reaction on the inside of your mouth or nose? No Did you need to seek medical attention at a hospital or doctor's office? Yes When did it last happen? 37-38 YEARS AGO If all above answers are "NO", may proceed with cephalosporin use.   . Metformin And Related Diarrhea  . Adhesive [Tape] Other (See Comments)    "severe bruising"  ALSO SKIN GLUE  . Asa [Aspirin] Other (See Comments)    GI upset   . Mango Flavor Hives  . Olive Tree Hives    Pt tolerates olive oil    Patient Measurements: Height: 5' 6"  (167.6 cm) Weight: 78.6 kg (173 lb 3.2 oz) IBW/kg (Calculated) : 59.3 Heparin Dosing Weight: TBW  Vital Signs: Temp: 97.7 F (36.5 C) (07/04 1201) Temp Source: Oral (07/04 1201) BP: 107/70 (07/04 1201) Pulse Rate: 90 (07/04 1201)  Labs: Recent Labs    11/07/19 0506 11/07/19 0506 11/08/19 0440 11/09/19 0556  HGB 8.2*   < > 8.4* 8.3*  HCT 26.2*  --  27.3* 26.6*  PLT 484*  --  489* 403*  CREATININE 1.04*  --   --  1.23*   < > = values in this interval not displayed.    Estimated Creatinine Clearance: 53.4 mL/min (A) (by C-G formula based on SCr of 1.23 mg/dL (H)).   Medical History: Past Medical History:  Diagnosis Date  . Allergy   . Arthritis   . Asthma   . Cancer [facility])    R breast cancer  . Diabetes mellitus without complication (HCC)    no meds now  . Family history of adverse reaction to anesthesia    "entire family takes a long time to wake up- N&V for several days"  . Family history of lung cancer   . Hypertension   . Migraine   . PONV (postoperative nausea and vomiting)     Medications:  Medications Prior to Admission  Medication Sig Dispense Refill Last  Dose  . acetaminophen (TYLENOL) 500 MG tablet Take 1 tablet (500 mg total) by mouth 3 (three) times daily. 30 tablet 0 10/15/2019 at Unknown time  . albuterol (VENTOLIN HFA) 108 (90 Base) MCG/ACT inhaler Inhale 2 puffs into the lungs every 4 (four) hours as needed for wheezing or shortness of breath (cough, shortness of breath or wheezing.). 1 Inhaler 1 10/15/2019 at Unknown time  . atenolol (TENORMIN) 100 MG tablet TAKE 1 TABLET BY MOUTH EVERY DAY (Patient taking differently: Take 100 mg by mouth at bedtime. ) 30 tablet 5 10/15/2019 at 7pm  . fluticasone-salmeterol (ADVAIR HFA) 115-21 MCG/ACT inhaler Inhale 2 puffs into the lungs 2 (two) times daily. 1 Inhaler 12 10/15/2019 at Unknown time  . ibuprofen (ADVIL) 200 MG tablet Take 1 tablet (200 mg total) by mouth every 8 (eight) hours as needed for moderate pain. 30 tablet 0 Past Week at Unknown time  . KLOR-CON M20 20 MEQ tablet Take 20 mEq by mouth at bedtime.    10/15/2019 at Unknown time  . pantoprazole (PROTONIX) 40 MG tablet Take 1 tablet (40 mg total) by mouth daily. (Patient taking differently: Take 40 mg by mouth at bedtime. ) 30 tablet 0 10/15/2019 at Unknown time  . traMADol (ULTRAM) 50 MG tablet  Take 1 tablet (50 mg total) by mouth every 6 (six) hours as needed for severe pain. 10 tablet 0 10/15/2019 at Unknown time  . Vitamin D, Ergocalciferol, (DRISDOL) 1.25 MG (50000 UNIT) CAPS capsule TAKE 1 CAPSULE BY MOUTH ONE TIME PER WEEK (Patient taking differently: Take 50,000 Units by mouth every Saturday. ) 4 capsule 5 10/11/2019  . blood glucose meter kit and supplies KIT Dispense based on patient and insurance preference. Use up to four times daily as directed. (FOR ICD-9 250.00, 250.01). 1 each 0   . capecitabine (XELODA) 500 MG tablet Take 3 tablets (1,500 mg total) by mouth 2 (two) times daily after a meal. Take daily for 14 days, then do not take for 7 days, then repeat. 84 tablet 6   . glucose blood (ACCU-CHEK ACTIVE STRIPS) test strip Use as instructed-  check blood sugar up to 2x a day 100 each 12    Scheduled:  . acetaminophen  500 mg Oral TID WC  . atenolol  100 mg Oral Daily  . capecitabine  1,500 mg Oral BID PC  . feeding supplement (ENSURE ENLIVE)  237 mL Oral TID BM  . feeding supplement (PRO-STAT SUGAR FREE 64)  30 mL Oral BID  . fentaNYL  1 patch Transdermal Q72H  . fentaNYL  1 patch Transdermal Q72H  . hydrocortisone cream   Topical BID  . insulin aspart  0-15 Units Subcutaneous Q4H  . magic mouthwash w/lidocaine  5 mL Oral QID  . mouth rinse  15 mL Mouth Rinse BID  . nystatin  5 mL Oral QID  . polyethylene glycol  17 g Oral BID  . promethazine  12.5 mg Oral Q6H  . senna  2 tablet Oral BID  . sucralfate  1 g Oral TID WC & HS   Infusions:  . heparin      Assessment: 81 yoF with PMH metastatic breast CA, HTN, HLD, DM2, asthma, admitted for left hemithorax after failed trial with PleurX catheter. Due to pain and swelling in LUE, vascular ultrasound was performed which revealed acute DVT in L IJ, subclavian, axillary, and brachial veins. Pharmacy consulted to start IV heparin, dosed conservatively and without bolus. Note patient may need palliative thoracentesis in the near future.   Baseline INR, aPTT: not done  Prior anticoagulation: Lovenox ppx - 40 mg SQ daily; LD 7/4 at 0925  Significant events:  Today, 11/09/2019:  CBC: Hgb low but stable (likely d/t oral chemo); Plt borderline elevated  No bleeding or infusion issues per nursing  SCr 1.5 x baseline  Goal of Therapy: Heparin level 0.3-0.5 units/ml Monitor platelets by anticoagulation protocol: Yes  Plan:  D/c Lovenox  Heparin 1100 units/hr IV infusion per [place] nomogram; omit bolus d/t bleeding risk, low Hgb  Check heparin level 6 hrs after start  Daily CBC, daily heparin level once stable  Monitor for signs of bleeding or thrombosis  Follow for any procedures requiring interruption of heparin  Reuel Boom, PharmD,  BCPS [phone] 11/09/2019, 4:09 PM

## 2019-11-09 NOTE — Plan of Care (Signed)
  Problem: Health Behavior/Discharge Planning: Goal: Ability to manage health-related needs will improve Outcome: Progressing   

## 2019-11-09 NOTE — Progress Notes (Addendum)
Heparin started in left lower forearm, left arm with notable swelling, elevated on pillows, Pt c/o pain in arm, Tramadol ordered and given as ordered. Daughter at bedside. Will continue to monitor. SRP, RN

## 2019-11-10 ENCOUNTER — Inpatient Hospital Stay (HOSPITAL_COMMUNITY): Payer: 59

## 2019-11-10 DIAGNOSIS — C50919 Malignant neoplasm of unspecified site of unspecified female breast: Secondary | ICD-10-CM

## 2019-11-10 LAB — BASIC METABOLIC PANEL
Anion gap: 11 (ref 5–15)
BUN: 61 mg/dL — ABNORMAL HIGH (ref 6–20)
CO2: 21 mmol/L — ABNORMAL LOW (ref 22–32)
Calcium: 9.1 mg/dL (ref 8.9–10.3)
Chloride: 109 mmol/L (ref 98–111)
Creatinine, Ser: 1.3 mg/dL — ABNORMAL HIGH (ref 0.44–1.00)
GFR calc Af Amer: 53 mL/min — ABNORMAL LOW (ref >60)
GFR calc non Af Amer: 46 mL/min — ABNORMAL LOW (ref >60)
Glucose, Bld: 192 mg/dL — ABNORMAL HIGH (ref 70–99)
Potassium: 3.8 mmol/L (ref 3.5–5.1)
Sodium: 141 mmol/L (ref 135–145)

## 2019-11-10 LAB — GLUCOSE, CAPILLARY
Glucose-Capillary: 113 mg/dL — ABNORMAL HIGH (ref 70–99)
Glucose-Capillary: 128 mg/dL — ABNORMAL HIGH (ref 70–99)
Glucose-Capillary: 138 mg/dL — ABNORMAL HIGH (ref 70–99)
Glucose-Capillary: 173 mg/dL — ABNORMAL HIGH (ref 70–99)
Glucose-Capillary: 174 mg/dL — ABNORMAL HIGH (ref 70–99)

## 2019-11-10 LAB — CBC WITH DIFFERENTIAL/PLATELET
Abs Immature Granulocytes: 0.18 10*3/uL — ABNORMAL HIGH (ref 0.00–0.07)
Basophils Absolute: 0 10*3/uL (ref 0.0–0.1)
Basophils Relative: 0 %
Eosinophils Absolute: 0 10*3/uL (ref 0.0–0.5)
Eosinophils Relative: 0 %
HCT: 23.3 % — ABNORMAL LOW (ref 36.0–46.0)
Hemoglobin: 7.7 g/dL — ABNORMAL LOW (ref 12.0–15.0)
Immature Granulocytes: 2 %
Lymphocytes Relative: 8 %
Lymphs Abs: 0.9 10*3/uL (ref 0.7–4.0)
MCH: 28.9 pg (ref 26.0–34.0)
MCHC: 33 g/dL (ref 30.0–36.0)
MCV: 87.6 fL (ref 80.0–100.0)
Monocytes Absolute: 0.3 10*3/uL (ref 0.1–1.0)
Monocytes Relative: 3 %
Neutro Abs: 10 10*3/uL — ABNORMAL HIGH (ref 1.7–7.7)
Neutrophils Relative %: 87 %
Platelets: 338 10*3/uL (ref 150–400)
RBC: 2.66 MIL/uL — ABNORMAL LOW (ref 3.87–5.11)
RDW: 15.3 % (ref 11.5–15.5)
WBC: 11.5 10*3/uL — ABNORMAL HIGH (ref 4.0–10.5)
nRBC: 0.2 % (ref 0.0–0.2)

## 2019-11-10 LAB — BODY FLUID CELL COUNT WITH DIFFERENTIAL: Total Nucleated Cell Count, Fluid: 39 cu mm (ref 0–1000)

## 2019-11-10 LAB — LACTATE DEHYDROGENASE, PLEURAL OR PERITONEAL FLUID: LD, Fluid: 9835 U/L — ABNORMAL HIGH (ref 3–23)

## 2019-11-10 LAB — PROTEIN, PLEURAL OR PERITONEAL FLUID: Total protein, fluid: 3.2 g/dL

## 2019-11-10 LAB — GLUCOSE, PLEURAL OR PERITONEAL FLUID: Glucose, Fluid: 71 mg/dL

## 2019-11-10 MED ORDER — HEPARIN (PORCINE) 25000 UT/250ML-% IV SOLN
1050.0000 [IU]/h | INTRAVENOUS | Status: DC
  Administered 2019-11-10: 1050 [IU]/h via INTRAVENOUS

## 2019-11-10 MED ORDER — ADULT MULTIVITAMIN W/MINERALS CH
1.0000 | ORAL_TABLET | Freq: Every day | ORAL | Status: DC
  Administered 2019-11-11 – 2019-11-13 (×3): 1 via ORAL
  Filled 2019-11-10 (×3): qty 1

## 2019-11-10 MED ORDER — POLYETHYLENE GLYCOL 3350 17 G PO PACK: 17.0000 g | PACK | Freq: Every day | ORAL | Status: DC | PRN

## 2019-11-10 MED ORDER — SODIUM CHLORIDE 0.9 % IV SOLN: INTRAVENOUS | Status: DC

## 2019-11-10 MED ORDER — ENOXAPARIN SODIUM 80 MG/0.8ML ~~LOC~~ SOLN
80.0000 mg | Freq: Two times a day (BID) | SUBCUTANEOUS | Status: DC
  Administered 2019-11-10: 80 mg via SUBCUTANEOUS
  Filled 2019-11-10: qty 0.8

## 2019-11-10 MED ORDER — BOOST / RESOURCE BREEZE PO LIQD CUSTOM
1.0000 | Freq: Two times a day (BID) | ORAL | Status: DC
  Administered 2019-11-10 – 2019-11-13 (×5): 1 via ORAL

## 2019-11-10 MED ORDER — ENOXAPARIN SODIUM 80 MG/0.8ML ~~LOC~~ SOLN
80.0000 mg | SUBCUTANEOUS | Status: DC
  Filled 2019-11-10: qty 0.8

## 2019-11-10 NOTE — Progress Notes (Signed)
PROGRESS NOTE  Autumn Cook  TFT:[id number] DOB: [birth year] DOA: 10/16/2019 PCP: Darreld Mclean, MD  Brief Narrative:  59 year old African-American female Recent hospitalization 10/09/2019-10/14/2019 declared stage IV metastatic breast cancer-at that admission pleural tap was done Pleurx catheter was placed Patient was discharged home to follow-up with her oncologist on 6/9 but for some reason she did not get home health no one showed up to sure how to drain the Pleurx She went to [place] on 6/10 with short of breath found to have complete opacification left hemithorax-she was treated as if she had an HCAP and admitted to the hospital for management of the same  She also carries diagnosis of HTN HLD DM TY 2 asthma Pulmonology consulted saw the patient felt she had elements of trapped lung therefore not a candidate for pleurodesis Palliative care saw the patient also in consult to assist with pain control Attempts were made at draining the Pleurx catheter on 7/2 unsuccessfully On 7/4 underwent CT of chest and was found by left upper extremity duplex to have a DVT in the IJ On 7/5 pulmonology drained 600 cc of fluid from lung Palliative discussions continue with patient and family-patient herself remains undecided with regards to end-of-life care versus full scope of treatment  Assessment & Plan:   Active Problems:   Hypertension   History of tobacco use   Hypercholesterolemia   Malignant neoplasm of overlapping sites of right breast in female, estrogen receptor positive ([place])   Encounter for central line placement   Malignant pleural effusion   Pleural effusion on left   Metastases to the liver [facility])   Acute respiratory failure (HCC)   Diabetes mellitus type 2, uncontrolled, with complications (HCC)   Acute respiratory failure with hypoxia (HCC)   Pneumonia of right lower lobe due to infectious organism   Generalized pain   General weakness   Palliative care by  specialist   Cancer associated pain   Therapeutic opioid induced constipation  1. Metastatic stage IV breast cancer with large left-sided effusion a. Status post lumpectomy 12/2018 with neoadjuvant treatment as guided by 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 CT performed 7/4 and 600 cc of fluid drained as per pulmonology at the bedside c. Pulmonology has seen the patient and are recommending hospice  d. Continuing Xeloda  e. defer to [name] to have further discussions On return tomorrow in terms of other options for therapy although I think they are limited 2. Cancer related pain managed by palliative care i. Continuing fentanyl 37.5 every 72, tramadol 75 every 6 as needed, morphine injection 2 mg every 3 as needed for severe breakthrough pain 3. Diabetes mellitus type 2 A1c 7.8 a. Sugars are controlled on sliding scale 4. HTN a. Low normal blood pressures at times b. Continue atenolol 100 daily--PTA Zestoretic on hold c. Lopressor as needed for heart rate 5. Secondary to opiates constipation a. Continue MiraLAX, senna, sorbitol as needed b. Passing good stool  6. AKI  a. Saline at 75 cc/h b. Bladder scan to ensure no retention as is on high-dose opiates c. Nursing aware to place Foley if above 150 cc retained as post void residual 7. reflux a. Continue Carafate, 8. Asymptomatic bacteriuria a. Multiple colony-forming units 6/24-no further coverage at this time 9. HCAP suspected on admission likely more related to fluid a. Has been on cefepime since 6/25-through 7/1 b. White count slightly up but no fevers monitor trends  DVT prophylaxis:  Code Status: Full Family Communication: Discussed with daughter bedside--I have explained to Autumn Cook her daughter clearly that we are at another [place] wherein pulmonology do not think there is any other intervention that we could offer other than repeat thoracenteses which are clearly painful [name]  is a healthcare power of attorney and expresses good understanding that although her mom seems to understand the implications of our discussions about end-stage cancer, she quite has not accepted that this is happening to her and wishes to pursue a curative route I have mentioned that we will CC [name] on this note to ensure he is aware-- I do not think that there is any other curative options at this juncture and we will allow some time and space for patient to think through this with the caveat that this might end up with her needing to go home with home palliative care and then transition possibly to hospice if she continues to decline disposition:   Status is: Inpatient  Remains inpatient appropriate because:Altered mental status, Unsafe d/c plan, IV treatments appropriate due to intensity of illness or inability to take PO and Inpatient level of care appropriate due to severity of illness   Dispo: The patient is from: Home              Anticipated d/c is to: Home              Anticipated d/c date is: 3 days              Patient currently is not medically stable to d/c.  Consultants:   Pulmonology  Oncology  Procedures: Multiple attempts at fluid drainage  Antimicrobials: Cefepime 6/20 5127/2  Subjective: Quite sleepy Eventful day today with thoracentesis Did not really obtain a review of systems as she was not awake enough Very long discussion with daughter as above  Objective: Vitals:   11/10/19 0659 11/10/19 1142 11/10/19 1204 11/10/19 1500  BP: 137/85 137/79 120/69 102/60  Pulse: 91 87 89 88  Resp: 14 14 15    Temp: (!) 97.5 F (36.4 C)   99 F (37.2 C)  TempSrc: Oral   Axillary  SpO2: 100% 100% 97% 96%  Weight:      Height:        Intake/Output Summary (Last 24 hours) at 11/10/2019 1651 Last data filed at 11/10/2019 1527 Gross per 24 hour  Intake 208.82 ml  Output 650 ml  Net -441.18 ml   Filed Weights   10/27/19 1854 10/28/19 0406 11/09/19 0508    Weight: 83.1 kg 81.3 kg 78.6 kg    Examination:  General exam:no distress-sleepy but arousable at times Respiratory system: Decreased air entry left side-poor exam Cardiovascular system: S1-S2 no murmur slightly tachycardic Gastrointestinal system: Soft nontender no organomegaly no rebound.  Data Reviewed: I have personally reviewed following labs and imaging studies  BUNs/creatinine up to 60/1.2-->61/1.3 White count 11.4-->11.5 Hemoglobin down to 7.7   Radiology Studies: CT CHEST WO CONTRAST  Result Date: 11/09/2019 CLINICAL DATA:  Pleural effusion. Malignancy is suspected. Metastatic breast cancer. Severe LEFT chest pain. Swelling of the LEFT arm. EXAM: CT CHEST WITHOUT CONTRAST TECHNIQUE: Multidetector CT imaging of the chest was performed following the standard protocol without IV contrast. COMPARISON:  10/20/2019 FINDINGS: Cardiovascular: Heart and mediastinal structures are shifted towards the RIGHT. Heart is otherwise unremarkable. The noncontrast appearance of the aorta and pulmonary arteries is unremarkable. Mediastinum/Nodes: Mediastinal shift towards the RIGHT. Significant artifact limits evaluation of the thyroid gland. Enlarged LEFT axillary lymph  nodes, measuring up to 11 millimeter short axis. No mediastinal adenopathy. Esophagus is unremarkable. Lungs/Pleura: Small Pleurx catheter is identified at the base of the LEFT pleural space. The entire LEFT lung is collapsed. Posteriorly, low-attenuation collection appears more discrete and spans the LEFT hemithorax. Collection measures 10.6 x 9.6 x 15.5 centimeters. Small amount of air is identified in the nondependent apical portion of this collection. Density measurements of this low-attenuation collection are indeterminate for simple fluid. A second small collection is identified in the anterior pleural space and also contains a small amount of air and measures 5.0 x 3.1 by 3.5 centimeters. Thickened, irregular pleura. The LEFT  hemithorax is expanded, as before. Upper Abdomen: Low-attenuation lesion within the liver measures at least 3.8 centimeters. Musculoskeletal: No fracture is seen. Other: Irregular low-attenuation mass is identified in the posterior aspect of the RIGHT breast, 2.9 x 2.3 centimeters, likely site of prior lumpectomy. The LEFT breast is enlarged and not completely imaged. There is skin and trabecular thickening of the LEFT breast which is asymmetrically enlarged compared to the RIGHT. IMPRESSION: 1. Large low-attenuation collection involving the LEFT hemithorax, measuring 10.6 x 9.6 x 15.5 centimeters. Small amount of air in the nondependent apical portion of this collection. A second small collection is identified in the anterior pleural space and also contains a small amount of air. Persistent expansion of the LEFT hemithorax with mediastinal shift towards the RIGHT. 2. Enlarged LEFT axillary lymph nodes, suspicious for metastatic disease. 3. Irregular low-attenuation mass in the posterior aspect of the RIGHT breast, likely site of prior lumpectomy. 4. Enlarged LEFT breast with skin and trabecular thickening of the LEFT breast which is asymmetrically enlarged compared to the RIGHT. 5. Stable low-attenuation lesion within the liver, measuring at least 3.8 centimeters. 6. Aortic Atherosclerosis (ICD10-I70.0). Electronically Signed   By: Nolon Nations M.D.   On: 11/09/2019 14:43   DG CHEST PORT 1 VIEW  Result Date: 11/10/2019 CLINICAL DATA:  Status post thoracentesis. EXAM: PORTABLE CHEST 1 VIEW COMPARISON:  Single-view of the chest 10/29/2019. CT chest 11/29/2019. FINDINGS: Complete whiteout of the left chest is worse than on the prior plain film where there was some aerated lung in the apex. No pneumothorax. Left pleural drainage catheter is in place. Right lung appears clear. The cardiac silhouette is largely obscured. IMPRESSION: Negative for pneumothorax after thoracentesis. Complete whiteout of the left chest  consistent due to a pleural effusion and airspace disease. Pleural drainage catheter is in place in the left chest. Electronically Signed   By: Inge Rise M.D.   On: 11/10/2019 15:25   VAS [place] UPPER EXTREMITY VENOUS DUPLEX  Result Date: 11/10/2019 UPPER VENOUS STUDY  Indications: Pain, and Swelling Limitations: Poor ultrasound/tissue interface and line. Comparison Study: No prior studies. Performing Technologist: Oliver Hum RVT  Examination Guidelines: A complete evaluation includes B-mode imaging, spectral Doppler, color Doppler, and power Doppler as needed of all accessible portions of each vessel. Bilateral testing is considered an integral part of a complete examination. Limited examinations for reoccurring indications may be performed as noted.  Right Findings: +----------+------------+---------+-----------+----------+-------+ RIGHT     CompressiblePhasicitySpontaneousPropertiesSummary +----------+------------+---------+-----------+----------+-------+ Subclavian    Full       Yes       Yes                      +----------+------------+---------+-----------+----------+-------+  Left Findings: +----------+------------+---------+-----------+----------+-------+ LEFT      CompressiblePhasicitySpontaneousPropertiesSummary +----------+------------+---------+-----------+----------+-------+ IJV           None  No        No                Acute  +----------+------------+---------+-----------+----------+-------+ Subclavian    None       No        No                Acute  +----------+------------+---------+-----------+----------+-------+ Axillary      None       No        No                Acute  +----------+------------+---------+-----------+----------+-------+ Brachial      None       No        No                Acute  +----------+------------+---------+-----------+----------+-------+ Radial        Full                                           +----------+------------+---------+-----------+----------+-------+ Ulnar         Full                                          +----------+------------+---------+-----------+----------+-------+ Cephalic      Full                                          +----------+------------+---------+-----------+----------+-------+ Basilic       None                                          +----------+------------+---------+-----------+----------+-------+  Summary:  Right: No evidence of thrombosis in the subclavian.  Left: Findings consistent with acute deep vein thrombosis involving the left internal jugular vein, left subclavian vein, left axillary vein and left brachial veins. Findings consistent with acute superficial vein thrombosis involving the left basilic vein.  *See table(s) above for measurements and observations.  Diagnosing physician: Monica Martinez MD Electronically signed by Monica Martinez MD on 11/10/2019 at 10:55:45 AM.    Final      Scheduled Meds: . acetaminophen  500 mg Oral TID WC  . atenolol  100 mg Oral Daily  . capecitabine  1,500 mg Oral BID PC  . enoxaparin (LOVENOX) injection  80 mg Subcutaneous Q12H  . feeding supplement  1 Container Oral BID BM  . feeding supplement (PRO-STAT SUGAR FREE 64)  30 mL Oral BID  . fentaNYL  1 patch Transdermal Q72H  . fentaNYL  1 patch Transdermal Q72H  . hydrocortisone cream   Topical BID  . insulin aspart  0-15 Units Subcutaneous Q4H  . magic mouthwash w/lidocaine  5 mL Oral QID  . mouth rinse  15 mL Mouth Rinse BID  . multivitamin with minerals  1 tablet Oral Daily  . nystatin  5 mL Oral QID  . promethazine  12.5 mg Oral Q6H  . senna  2 tablet Oral BID  . sucralfate  1 g Oral TID WC & HS   Continuous Infusions:   LOS: 25 days   Time  spent: [place]ien, MD [facility] To contact the attending provider between 7A-7P or the covering provider during after hours 7P-7A, please log into the web site  [email or web address] and access using universal [place] password for that web site. If you do not have the password, please call the hospital operator.  11/10/2019, 4:51 PM

## 2019-11-10 NOTE — Procedures (Signed)
Thoracentesis Procedure Note  Pre-operative Diagnosis: breast cancer, malignant pleural effusion  Post-operative Diagnosis: same  Indications: as above  Procedure Details  Consent: Informed consent was obtained. Risks of the procedure were discussed including: infection, bleeding, pain, pneumothorax.  Under sterile conditions the patient was positioned. Betadine solution and sterile drapes were utilized.  1% buffered lidocaine was used to anesthetize the 8th rib space. Fluid was obtained without any difficulties and minimal blood loss.  A dressing was applied to the wound and wound care instructions were provided.   Findings 600 ml of brown pleural fluid was obtained. A sample was sent to Pathology for cytogenetics, flow, and cell counts, as well as for infection analysis.  Complications:  None; patient tolerated the procedure well.          Condition: stable  Plan A follow up chest x-ray was ordered. Bed Rest for 1 hours. Tylenol 650 mg. for pain.  Attending Attestation: I performed the procedure.  Roselie Awkward, MD [place] [facility] Pager: [phone] Cell: [phone] If no response, call [phone]

## 2019-11-10 NOTE — Progress Notes (Signed)
Spoke with Primary RN concerning a question about using left PIV for fluids per Dr. Verlon Au. Patient is restricted on R arm for lymph node removal. US doppler of L arm shows DVT in left IJ, left subclavian, left axillary vein and left brachial vein. Considering she has altered blood flow to left arm and restriction to right arm, suggested a central line with a right subclavian or IJ approach or femoral.

## 2019-11-10 NOTE — Progress Notes (Signed)
Order for IVF placed for this patient. Due to an DVT and edema in Lt. Arm and a restricted Rt arm IVF were not given. Per IV team patient would need a Central line IJ or femoral. MD made aware.

## 2019-11-10 NOTE — Progress Notes (Signed)
ANTICOAGULATION CONSULT NOTE - Follow Up Consult  Pharmacy Consult for Heparin Indication: DVT  Allergies  Allergen Reactions  . Penicillins Hives    Did it involve swelling of the face/tongue/throat, SOB, or low BP? Yes--syncope episode Did it involve sudden or severe rash/hives, skin peeling, or any reaction on the inside of your mouth or nose? No Did you need to seek medical attention at a hospital or doctor's office? Yes When did it last happen? 37-38 YEARS AGO If all above answers are "NO", may proceed with cephalosporin use.   . Metformin And Related Diarrhea  . Adhesive [Tape] Other (See Comments)    "severe bruising"  ALSO SKIN GLUE  . Asa [Aspirin] Other (See Comments)    GI upset   . Mango Flavor Hives  . Olive Tree Hives    Pt tolerates olive oil    Patient Measurements: Height: 5\' 6"  (167.6 cm) Weight: 78.6 kg (173 lb 3.2 oz) IBW/kg (Calculated) : 59.3 Heparin Dosing Weight:   Vital Signs: Temp: 97.5 F (36.4 C) (07/04 2016) Temp Source: Oral (07/04 2016) BP: 149/87 (07/04 2016) Pulse Rate: 88 (07/04 2016)  Labs: Recent Labs    11/07/19 0506 11/07/19 0506 11/08/19 0440 11/09/19 0556 11/09/19 2242  HGB 8.2*   < > 8.4* 8.3*  --   HCT 26.2*  --  27.3* 26.6*  --   PLT 484*  --  489* 403*  --   HEPARINUNFRC  --   --   --   --  0.64  CREATININE 1.04*  --   --  1.23*  --    < > = values in this interval not displayed.    Estimated Creatinine Clearance: 53.4 mL/min (A) (by C-G formula based on SCr of 1.23 mg/dL (H)).   Medications:  Infusions:  . heparin 1,100 Units/hr (11/09/19 2200)    Assessment: Patient with high heparin level.  No heparin issues per RN.  Goal of Therapy:  Heparin level 0.3-0.5 units/ml (reduced goal) Monitor platelets by anticoagulation protocol: Yes   Plan:  Decrease heparin to 1050 units/hr Recheck heparin level at [address] [place] 11/10/2019,12:23 AM

## 2019-11-10 NOTE — Progress Notes (Signed)
[facility]  Thoracentesis performed Stopped at 600 cc due to mild pain     Roselie Awkward, MD [place] [facility] Pager: [phone] Cell: [phone] If no response, call [phone]

## 2019-11-10 NOTE — Progress Notes (Signed)
[place] pulmonary and critical care medicine  Subjective:  Resting comfortably  On exam:  Vitals:   11/09/19 1201 11/09/19 2000 11/09/19 2016 11/10/19 0659  BP: 107/70  (Abnormal) 149/87 137/85  Pulse: 90  88 91  Resp: 19 (Abnormal) 9 12 14   Temp: 97.7 F (36.5 C)  (Abnormal) 97.5 F (36.4 C) (Abnormal) 97.5 F (36.4 C)  TempSrc: Oral  Oral Oral  SpO2: 100%  100% 100%  Weight:      Height:        General Pleasant resting in bed no acute distress HEENT normocephalic atraumatic no jugular venous distention Pulmonary: Clear to auscultation remarkably diminished throughout the left hemithorax Cardiac: Regular rate and rhythm without murmur rub or gallop Abdomen: Soft nontender Extremities: Warm dry brisk cap refill Neuro: Awake oriented, drowsy but cooperative and moving all extremities    CBC    Component Value Date/Time   WBC 11.4 (H) 11/09/2019 0556   RBC 2.99 (L) 11/09/2019 0556   HGB 8.3 (L) 11/09/2019 0556   HGB 12.9 07/24/2018 0825   HCT 26.6 (L) 11/09/2019 0556   PLT 403 (H) 11/09/2019 0556   PLT 342 07/24/2018 0825   MCV 89.0 11/09/2019 0556   MCV 86.8 12/06/2011 1042   MCH 27.8 11/09/2019 0556   MCHC 31.2 11/09/2019 0556   RDW 15.6 (H) 11/09/2019 0556   LYMPHSABS 1.0 11/09/2019 0556   MONOABS 0.7 11/09/2019 0556   EOSABS 0.0 11/09/2019 0556   BASOSABS 0.0 11/09/2019 0556     Impression: Metastatic breast cancer Malignant pleural effusion> thick, doesn't drain well despite TPA Severe pain on left chest due to breast cancer  Discussion/Plan CT imaging reviewed.  Fairly large loculated fluid collection noted on CT imaging, this does not communicate with the Pleurx tube.  This likely represents trapped lung, with fluid filling space the lung would typically fill, and if so draining it will likely be associated with discomfort however we do not know with certainty.  At this point we have tried TPA, drainage, no associated significant discomfort however has  fluids reaccumulate she has significant shortness of breath.  She has requested wanting to exhaust all potential therapies, at this point we could offer repeat thoracentesis with the understanding that it could certainly be associated with discomfort which if encountered we would discontinue drainage immediately.  She understands the fluid will indeed reaccumulate, is not clear how fast.  At this point would not offer a second drain especially not knowing how she would respond to having this fluid collection evacuated I discussed all of these options with her daughter [name] at bedside  Plan Proceed with bedside thoracentesis this afternoon Stop heparin, can resume after Inoc[facility] Continue supportive care Further potential therapeutic options to be discussed pending tolerance of thoracentesis and results of thoracentesis  Erick Colace ACNP-BC [place] Pager # [phone] OR # [phone] if no answer

## 2019-11-10 NOTE — Progress Notes (Signed)
Physical Therapy Treatment Patient Details Name: Autumn Cook MRN: [id number] DOB: 1960/10/30 Today's Date: 11/10/2019    History of Present Illness 59 yo female former smoker with metastatic breast cancer complicated by malignant pleural effusion.  She had pleurx catheter placed on 10/14/19.  Palliative medicine has been consulted, following for pain control.  Pt now with DVT  L UE,  on Heparin    PT Comments    Treatment/progress limited d/t lethargy. Pt underwent thoracentesis earlier today.  Educated dtr in L UE positioning as well as LE exercises. Per dtr pt is still getting up to [facility] when she can although needing incr assist. Difficult for PT (cancels/refusals, etc too numerous to count) to work with pt as she is either in too much pain or lethargic d/t meds, have encouraged family to assist, work on exercises as tolerated.   Follow Up Recommendations  Supervision/Assistance - 24 hour;Home health PT     Equipment Recommendations  Rolling walker with 5" wheels;3in1 (PT);Wheelchair (measurements PT);Wheelchair cushion (measurements PT);Hospital bed    Recommendations for Other Services       Precautions / Restrictions Precautions Precautions: Fall Precaution Comments: monitor O2, pleurx catheter; dizzy in sitting    Mobility  Bed Mobility               General bed mobility comments: unable to participate d/t lethargy  Transfers                    Ambulation/Gait                 Stairs             Wheelchair Mobility    Modified Rankin (Stroke Patients Only)       Balance                                            Cognition Arousal/Alertness: Lethargic                                     General Comments: unable to arouse d/t meds, dtr present       Exercises Other Exercises Other Exercises: instructed dtr in isometric quad sets, isometric glut sets, knee flexion with manually resisted hip and  knee extension, PROM intact/WFL throughout LEs.     General Comments General comments (skin integrity, edema, etc.): LUE edematous. pt heparinized. repositioned/elevated  L UE.      Pertinent Vitals/Pain Pain Assessment: Faces Faces Pain Scale: Hurts a little bit Pain Location: with imposed movement  Pain Descriptors / Indicators: Grimacing Pain Intervention(s): Monitored during session;Premedicated before session    Home Living                      Prior Function            PT Goals (current goals can now be found in the care plan section) Acute Rehab PT Goals Patient Stated Goal: to go home, to be able to walk farther PT Goal Formulation: With patient/family Time For Goal Achievement: 11/14/19 Potential to Achieve Goals: Fair Progress towards PT goals: Not progressing toward goals - comment (medical issues )    Frequency    Min 2X/week      PT Plan Current plan remains appropriate;Frequency needs  to be updated    Co-evaluation              AM-PAC PT "6 Clicks" Mobility   Outcome Measure  Help needed turning from your back to your side while in a flat bed without using bedrails?: A Lot Help needed moving from lying on your back to sitting on the side of a flat bed without using bedrails?: A Lot Help needed moving to and from a bed to a chair (including a wheelchair)?: A Lot Help needed standing up from a chair using your arms (e.g., wheelchair or bedside chair)?: A Lot Help needed to walk in hospital room?: Total Help needed climbing 3-5 steps with a railing? : Total 6 Click Score: 10    End of Session   Activity Tolerance: Patient limited by lethargy Patient left: in bed;with call bell/phone within reach;with bed alarm set;with family/visitor present   PT Visit Diagnosis: Dizziness and giddiness (R42);Difficulty in walking, not elsewhere classified (R26.2)     Time: 3903-0092 PT Time Calculation (min) (ACUTE ONLY): 13 min  Charges:  $Self  Care/Home Management: 8-22                     Baxter Flattery, PT  Acute Rehab Dept [facility]) [phone] Pager [phone]  11/10/2019    [facility] 11/10/2019, 3:08 PM

## 2019-11-10 NOTE — Progress Notes (Signed)
Order for Bladder scan per MD Samtani d/t possible urine retention. Per Samtani if bladder scan showed greater then 150 mls his instructions were to insert foley catheter for this pt. Bladder scan showed 257 mls of urine. Pt daughter had questions concerning why the patient needed a foley and asked if the idea of inserting a foley could be revisited tomorrow, daughter requested a call from MD [facility]. MD paged with pt daughters request for phone call. After speaking with pt daughter verbal order Per MD Samtani, foley to wait in morning.

## 2019-11-10 NOTE — Progress Notes (Signed)
Nutrition Follow-up  DOCUMENTATION CODES:   Not applicable  INTERVENTION:  - continue 30 ml prostat BID. - will d/c Ensure Enlive per patient preference.  - will order Boost Breeze BID, each supplement provides 250 kcal and 9 grams of protein. - will order 1 tablet multivitamin with minerals/day.   NUTRITION DIAGNOSIS:   Inadequate oral intake related to poor appetite as evidenced by meal completion < 50%. -ongoing  GOAL:   Patient will meet greater than or equal to 90% of their needs -unmet  MONITOR:   PO intake, Supplement acceptance, Weight trends, Labs, I & O's  ASSESSMENT:   Pt presented with opacification of L hemithorax w/ recent diagnosis of L malignant pleural effusion 2/2 breast cancer. PMH includes breast cancer, HTN, HLD, T2DM, asthma.  She has been refusing Ensure and has requested Boost Breeze at times. Intakes have still been 25% or less. Weight yesterday was 173 lb and weight on admission (6/10) was 177 lb.   Per notes: - plan for bedside thoracentesis this afternoon - Palliative Care following--remain Full Code with ongoing discussions about options for care - constipation has resolved--no documented BM since 6/30?    Labs reviewed; CBGs: 113, 128, 138 mg/dl, BUN: 60 mg/dl, creatinine: 1.23 mg/dl, GFR: 56 ml/min. Medications reviewed; 5 ml mycostatin QID, 2 tablets senokot BID, 1 g carafate TID.    Diet Order:   Diet Order            Diet regular Room service appropriate? Yes; Fluid consistency: Thin  Diet effective now                 EDUCATION NEEDS:   Education needs have been addressed  Skin:  Skin Assessment: Reviewed RN Assessment  Last BM:  6/30  Height:   Ht Readings from Last 1 Encounters:  10/16/19 5\' 6"  (1.676 m)    Weight:   Wt Readings from Last 1 Encounters:  11/09/19 78.6 kg    Estimated Nutritional Needs:  Kcal:  1950-2150 Protein:  95-110 grams Fluid:  >1.95L     Jarome Matin, MS, RD, LDN,  CNSC Inpatient Clinical Dietitian RD pager # available in [facility]  After hours/weekend pager # available in [facility]

## 2019-11-11 LAB — PROTIME-INR
INR: 1.7 — ABNORMAL HIGH (ref 0.8–1.2)
Prothrombin Time: 19 seconds — ABNORMAL HIGH (ref 11.4–15.2)

## 2019-11-11 LAB — CBC WITH DIFFERENTIAL/PLATELET
Abs Immature Granulocytes: 0.14 10*3/uL — ABNORMAL HIGH (ref 0.00–0.07)
Basophils Absolute: 0 10*3/uL (ref 0.0–0.1)
Basophils Relative: 0 %
Eosinophils Absolute: 0 10*3/uL (ref 0.0–0.5)
Eosinophils Relative: 0 %
HCT: 23.4 % — ABNORMAL LOW (ref 36.0–46.0)
Hemoglobin: 7.4 g/dL — ABNORMAL LOW (ref 12.0–15.0)
Immature Granulocytes: 1 %
Lymphocytes Relative: 10 %
Lymphs Abs: 1.1 10*3/uL (ref 0.7–4.0)
MCH: 27.8 pg (ref 26.0–34.0)
MCHC: 31.6 g/dL (ref 30.0–36.0)
MCV: 88 fL (ref 80.0–100.0)
Monocytes Absolute: 0.5 10*3/uL (ref 0.1–1.0)
Monocytes Relative: 4 %
Neutro Abs: 9 10*3/uL — ABNORMAL HIGH (ref 1.7–7.7)
Neutrophils Relative %: 85 %
Platelets: 323 10*3/uL (ref 150–400)
RBC: 2.66 MIL/uL — ABNORMAL LOW (ref 3.87–5.11)
RDW: 15.1 % (ref 11.5–15.5)
WBC: 10.7 10*3/uL — ABNORMAL HIGH (ref 4.0–10.5)
nRBC: 0.2 % (ref 0.0–0.2)

## 2019-11-11 LAB — COMPREHENSIVE METABOLIC PANEL
ALT: 11 U/L (ref 0–44)
AST: 21 U/L (ref 15–41)
Albumin: 1.9 g/dL — ABNORMAL LOW (ref 3.5–5.0)
Alkaline Phosphatase: 88 U/L (ref 38–126)
Anion gap: 10 (ref 5–15)
BUN: 56 mg/dL — ABNORMAL HIGH (ref 6–20)
CO2: 19 mmol/L — ABNORMAL LOW (ref 22–32)
Calcium: 8.8 mg/dL — ABNORMAL LOW (ref 8.9–10.3)
Chloride: 111 mmol/L (ref 98–111)
Creatinine, Ser: 1.25 mg/dL — ABNORMAL HIGH (ref 0.44–1.00)
GFR calc Af Amer: 55 mL/min — ABNORMAL LOW (ref >60)
GFR calc non Af Amer: 48 mL/min — ABNORMAL LOW (ref >60)
Glucose, Bld: 138 mg/dL — ABNORMAL HIGH (ref 70–99)
Potassium: 3.6 mmol/L (ref 3.5–5.1)
Sodium: 140 mmol/L (ref 135–145)
Total Bilirubin: 0.5 mg/dL (ref 0.3–1.2)
Total Protein: 5.4 g/dL — ABNORMAL LOW (ref 6.5–8.1)

## 2019-11-11 LAB — GLUCOSE, CAPILLARY
Glucose-Capillary: 118 mg/dL — ABNORMAL HIGH (ref 70–99)
Glucose-Capillary: 130 mg/dL — ABNORMAL HIGH (ref 70–99)
Glucose-Capillary: 171 mg/dL — ABNORMAL HIGH (ref 70–99)
Glucose-Capillary: 191 mg/dL — ABNORMAL HIGH (ref 70–99)
Glucose-Capillary: 209 mg/dL — ABNORMAL HIGH (ref 70–99)
Glucose-Capillary: 228 mg/dL — ABNORMAL HIGH (ref 70–99)

## 2019-11-11 MED ORDER — TRAMADOL HCL 50 MG PO TABS
100.0000 mg | ORAL_TABLET | Freq: Four times a day (QID) | ORAL | Status: DC | PRN
  Administered 2019-11-11 – 2019-11-13 (×5): 100 mg via ORAL
  Filled 2019-11-11 (×5): qty 2

## 2019-11-11 MED ORDER — RIVAROXABAN 20 MG PO TABS: 20.0000 mg | ORAL_TABLET | Freq: Every day | ORAL | Status: DC

## 2019-11-11 MED ORDER — RIVAROXABAN 15 MG PO TABS
15.0000 mg | ORAL_TABLET | Freq: Two times a day (BID) | ORAL | Status: DC
  Administered 2019-11-11 – 2019-11-13 (×4): 15 mg via ORAL
  Filled 2019-11-11 (×4): qty 1

## 2019-11-11 MED ORDER — RIVAROXABAN 15 MG PO TABS: 15.0000 mg | ORAL_TABLET | Freq: Two times a day (BID) | ORAL | Status: DC

## 2019-11-11 MED ORDER — ENOXAPARIN SODIUM 80 MG/0.8ML ~~LOC~~ SOLN: 80.0000 mg | Freq: Two times a day (BID) | SUBCUTANEOUS | Status: DC

## 2019-11-11 NOTE — TOC Benefit Eligibility Note (Signed)
Transition of Care [facility]) Benefit Eligibility Note    Patient Details  Name: Autumn Cook MRN: [id number] Date of Birth: 02/25/1961   Medication/Dose: Eliquis 18m bid and 5 mg bid  Covered?: Yes  Tier: 3 Drug  Prescription Coverage Preferred Pharmacy: local  Spoke with Person/Company/Phone Number:: [phone] Co-Pay: $140.00  Prior Approval: No  Deductible: Met  Additional Notes: HH    FKerin SalenPhone Number: 11/11/2019, 2:49 PM

## 2019-11-11 NOTE — Progress Notes (Signed)
[place] pulmonary and critical care medicine  Subjective:  She is quite fatigued after getting up in bed.  Her daughter tells me it took assistance, left arm discomfort and chest discomfort were both primary barriers to mobility  On exam:  Vitals:   11/10/19 1204 11/10/19 1500 11/10/19 2032 11/11/19 0451  BP: 120/69 102/60 127/78 134/72  Pulse: 89 88 85 80  Resp: 15  18 20   Temp:  99 F (37.2 C) 98 F (36.7 C) (Abnormal) 97.4 F (36.3 C)  TempSrc:  Axillary Oral Oral  SpO2: 97% 96% 98% 96%  Weight:      Height:        General Pleasant 59 year old female resting in bed she will wake up with minimal gentle stimulus HEENT normocephalic atraumatic no jugular venous distention Pulmonary clear to auscultation on the right, diminished throughout the left no accessory use appreciated Cardiac regular rate and rhythm without murmur or gallop Abdomen soft nontender Extremities are warm and dry with brisk capillary refill She is neuro intact   CBC    Component Value Date/Time   WBC 10.7 (H) 11/11/2019 0511   RBC 2.66 (L) 11/11/2019 0511   HGB 7.4 (L) 11/11/2019 0511   HGB 12.9 07/24/2018 0825   HCT 23.4 (L) 11/11/2019 0511   PLT 323 11/11/2019 0511   PLT 342 07/24/2018 0825   MCV 88.0 11/11/2019 0511   MCV 86.8 12/06/2011 1042   MCH 27.8 11/11/2019 0511   MCHC 31.6 11/11/2019 0511   RDW 15.1 11/11/2019 0511   LYMPHSABS 1.1 11/11/2019 0511   MONOABS 0.5 11/11/2019 0511   EOSABS 0.0 11/11/2019 0511   BASOSABS 0.0 11/11/2019 0511     Impression: Metastatic breast cancer Malignant pleural effusion> thick, doesn't drain well despite TPA Severe pain on left chest due to breast cancer  Discussion/Plan We drain the secondary to large pleural collection on 7/5 and was successfully able to drain 600 cc before onset of discomfort.  Unfortunately radiographically she has absolutely no improvement contact.  Actually CXR was worse than previous films as far as the entire left  hemidiaphragm is opacified with a mix of pleural fluid and volume loss.  This fluid collection was exudative as expected;  to date no evidence of infection, we already know from initial sampling this was a malignant effusion.  At this point we have exhausted all pleural interventions, she has not benefited from evacuation of pleural fluid given lung entrapment and associated pain therefore we did not feel that further instrumentation of the pleural space is warranted as risk of pain and infection outweigh benefit of symptom management.  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

## 2019-11-11 NOTE — TOC Benefit Eligibility Note (Signed)
Transition of Care [facility]) Benefit Eligibility Note    Patient Details  Name: Autumn Cook MRN: [id number] Date of Birth: Sep 22, 1960   Medication/Dose: Xarelto 20 mg once daily for 30 days generic not on formulary  Covered?: Yes  Tier: 3 Drug  Prescription Coverage Preferred Pharmacy: local  Spoke with Person/Company/Phone Number:: Automated system [facility] [employer] [phone]  Co-Pay: $140.00  Prior Approval: No  Deductible: Met  Additional Notes: HH    Kerin Salen Phone Number: 11/11/2019, 1:18 PM

## 2019-11-11 NOTE — Progress Notes (Signed)
ADDENDUM: Long discussion with patient and daughter. Autumn Cook wants to get home. To do that she needs to be able to get OOB with minimal assistance, and be on oral meds. They feel they are close to that already so I am taking off the larger fentanyl paych (which is making her sleepy), d/c'ing the IV opain meds, and we will use the small fentanyl patch and tramadol for pain. I ordered a dietary consult per their request.  If she is comfortable on this combination (and can either afford the rivaroxaban or learn how to self-administer lovenox) could go hoem with HH-PT/OT within 24-48 houes  Greatly appreciate your help to this patient!  GM

## 2019-11-11 NOTE — Progress Notes (Signed)
PROGRESS NOTE  Autumn Cook  ASN:[id number] DOB: 1960/08/06 DOA: 10/16/2019 PCP: Darreld Mclean, MD  Brief Narrative:  59 year old African-American female Recent hospitalization 10/09/2019-10/14/2019 declared stage IV metastatic breast cancer-at that admission pleural tap was done Pleurx catheter was placed Patient was discharged home to follow-up with her oncologist on 6/9 but for some reason she did not get home health no one showed up to sure how to drain the Pleurx She went to [place] on 6/10 with short of breath found to have complete opacification left hemithorax-she was treated as if she had an HCAP and admitted to the hospital for management of the same  She also carries diagnosis of HTN HLD DM TY 2 asthma Pulmonology consulted saw the patient felt she had elements of trapped lung therefore not a candidate for pleurodesis Palliative care saw the patient also in consult to assist with pain control Attempts were made at draining the Pleurx catheter on 7/2 unsuccessfully On 7/4 underwent CT of chest and was found by left upper extremity duplex to have a DVT in the IJ On 7/5 pulmonology drained 600 cc of fluid from lung Per discussions with oncology yielded that she has candidacy for various lines of chemotherapy does not require port access and she has stabilized to some degree to the point that she may be able to go home in the next 24 to 48 hours  Assessment & Plan:   Active Problems:   Hypertension   History of tobacco use   Hypercholesterolemia   Malignant neoplasm of overlapping sites of right breast in female, estrogen receptor positive ([place])   Encounter for central line placement   Malignant pleural effusion   Pleural effusion on left   Metastases to the liver [facility])   Acute respiratory failure (HCC)   Diabetes mellitus type 2, uncontrolled, with complications (HCC)   Acute respiratory failure with hypoxia (HCC)   Pneumonia of right lower lobe due to  infectious organism   Generalized pain   General weakness   Palliative care by specialist   Cancer associated pain   Therapeutic opioid induced constipation  1. Metastatic stage IV breast cancer with large left-sided effusion a. Status post lumpectomy 12/2018 with neoadjuvant treatment as guided by 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 CT performed 7/4 and on 7/5 had 600 cc of fluid drained as per pulmonology at the bedside c. Thoracentesis fluid = preliminary no growth and no bacteria- d. Pulmonology has seen the patient and are recommending hospice  e. Continuing Xeloda  2. Cancer related pain managed by palliative care i. Oncologist [name] saw the patient on 7/6 and D escalated some of the pain regimen with good result in terms of improved mentation ii. Current medications include fentanyl 12 mcg every 72, tramadol 100 every 6 as needed iii. This plan will need to be amended in the next 24 to 48 hours 3. DVT left upper extremity (IJ subclavian axillary brachial consistent with acute superficial venous thrombosis left basilic vein) a. Needs to be anticoagulated and will be transitioning from IV to Eliquis today b. We will get right upper extremity ultrasound at the request of her daughter as she also has a bump on the lateral aspect of her shoulder which seems more like a hematoma 4. Diabetes mellitus type 2 A1c 7.8 a. Sugars are controlled on sliding scale 130-209 5. HTN a. Low normal blood pressures at times b. Continue atenolol 100 daily--PTA Zestoretic on hold  c. Lopressor as needed for heart rate 6. Secondary to opiates constipation a. Continue MiraLAX, senna, sorbitol as needed b. Passing good stool  7. AKI  a. Still some volume depletion b. Bladder scan done post void 7/5 yielded possible retention likely secondary to opiates c. We will cut back rate of fluids to 40 cc an hour and hopefully this will encourage p.o. intake d. Repeat  labs a.m. 8. Asymptomatic bacteriuria a. Multiple colony-forming units 6/24-no further coverage at this time 9. HCAP suspected on admission likely more related to fluid a. Has been on cefepime since 6/25-through 7/1 b. White count slightly up but no fevers monitor trends  DVT prophylaxis:  Code Status: Full Family Communication: Discussed with daughter bedside--discussed with care team including her oncologist [name] Plan is for patient to have pain control orally, hopefully recover from some mild AKI and likely we can plan for discharge in the next 48 hours once this occurs disposition:   Status is: Inpatient  Remains inpatient appropriate because:Altered mental status, Unsafe d/c plan, IV treatments appropriate due to intensity of illness or inability to take PO and Inpatient level of care appropriate due to severity of illness   Dispo: The patient is from: Home              Anticipated d/c is to: Home              Anticipated d/c date is: 2 days              Patient currently is not medically stable to d/c.  Consultants:   Pulmonology  Oncology  Procedures: Multiple attempts at fluid drainage  Antimicrobials: Cefepime 6/20 5127/2  Subjective: More awake alert Answered and fielded multiple questions regarding need for PICC line etc. and as patient will be alone at home with home health at times it was felt primarily the best anticoagulation for her DVT would be oral I have confirmed with family that this is an affordable possibility Patient herself is much more alert and is asking some good questions She does not seem to have overt pain other than in her right upper extremity   Objective: Vitals:   11/10/19 1500 11/10/19 2032 11/11/19 0451 11/11/19 1205  BP: 102/60 127/78 134/72 130/75  Pulse: 88 85 80 88  Resp:  18 20   Temp: 99 F (37.2 C) 98 F (36.7 C) (!) 97.4 F (36.3 C) 99.2 F (37.3 C)  TempSrc: Axillary Oral Oral Axillary  SpO2: 96% 98% 96% 94%    Weight:      Height:        Intake/Output Summary (Last 24 hours) at 11/11/2019 1609 Last data filed at 11/11/2019 1205 Gross per 24 hour  Intake 300 ml  Output 650 ml  Net -350 ml   Filed Weights   10/27/19 1854 10/28/19 0406 11/09/19 0508  Weight: 83.1 kg 81.3 kg 78.6 kg    Examination:  General exam: More awake coherent and interactive Respiratory system: Decreased air entry left side Cardiovascular system: S1-S2 no murmur slightly tachycardic Gastrointestinal system: Soft nontender no organomegaly no rebound.  Data Reviewed: I have personally reviewed following labs and imaging studies  BUNs/creatinine up to 60/1.2-->61/1.3-->56/1.2 CO2 19 Calcium 8.8 White count 11.4-->11.5-->10.7 Hemoglobin down to 7.4   Radiology Studies: DG CHEST PORT 1 VIEW  Result Date: 11/10/2019 CLINICAL DATA:  Status post thoracentesis. EXAM: PORTABLE CHEST 1 VIEW COMPARISON:  Single-view of the chest 10/29/2019. CT chest 11/29/2019. FINDINGS: Complete whiteout of the left  chest is worse than on the prior plain film where there was some aerated lung in the apex. No pneumothorax. Left pleural drainage catheter is in place. Right lung appears clear. The cardiac silhouette is largely obscured. IMPRESSION: Negative for pneumothorax after thoracentesis. Complete whiteout of the left chest consistent due to a pleural effusion and airspace disease. Pleural drainage catheter is in place in the left chest. Electronically Signed   By: Inge Rise M.D.   On: 11/10/2019 15:25     Scheduled Meds: . acetaminophen  500 mg Oral TID WC  . atenolol  100 mg Oral Daily  . enoxaparin (LOVENOX) injection  80 mg Subcutaneous Q12H  . feeding supplement  1 Container Oral BID BM  . feeding supplement (PRO-STAT SUGAR FREE 64)  30 mL Oral BID  . fentaNYL  1 patch Transdermal Q72H  . hydrocortisone cream   Topical BID  . insulin aspart  0-15 Units Subcutaneous Q4H  . mouth rinse  15 mL Mouth Rinse BID  .  multivitamin with minerals  1 tablet Oral Daily  . nystatin  5 mL Oral QID  . promethazine  12.5 mg Oral Q6H  . senna  2 tablet Oral BID  . sucralfate  1 g Oral TID WC & HS   Continuous Infusions: . sodium chloride      LOS: 26 days   Time spent: [place], MD [facility] To contact the attending provider between 7A-7P or the covering provider during after hours 7P-7A, please log into the web site [email or web address] and access using universal  password for that web site. If you do not have the password, please call the hospital operator.  11/11/2019, 4:09 PM

## 2019-11-11 NOTE — Progress Notes (Addendum)
[place] resumed, after possible change to Lovenox, patient decided against tunneled catheter placement, prefers to go home with oral meds Indication: DVT  Allergies  Allergen Reactions  . Penicillins Hives    Did it involve swelling of the face/tongue/throat, SOB, or low BP? Yes--syncope episode Did it involve sudden or severe rash/hives, skin peeling, or any reaction on the inside of your mouth or nose? No Did you need to seek medical attention at a hospital or doctor's office? Yes When did it last happen? 37-38 YEARS AGO If all above answers are "NO", may proceed with cephalosporin use.   . Metformin And Related Diarrhea  . Adhesive [Tape] Other (See Comments)    "severe bruising"  ALSO SKIN GLUE  . Asa [Aspirin] Other (See Comments)    GI upset   . Mango Flavor Hives  . Olive Tree Hives    Pt tolerates olive oil   Patient Measurements: Height: 5\' 6"  (167.6 cm) Weight: 78.6 kg (173 lb 3.2 oz) IBW/kg (Calculated) : 59.3 Heparin Dosing Weight: TBW  Vital Signs: Temp: 99.2 F (37.3 C) (07/06 1205) Temp Source: Axillary (07/06 1205) BP: 130/75 (07/06 1205) Pulse Rate: 88 (07/06 1205)  Labs: Recent Labs    11/09/19 0556 11/09/19 0556 11/09/19 2242 11/10/19 1043 11/11/19 0511  HGB 8.3*   < >  --  7.7* 7.4*  HCT 26.6*  --   --  23.3* 23.4*  PLT 403*  --   --  338 323  LABPROT  --   --   --   --  19.0*  INR  --   --   --   --  1.7*  HEPARINUNFRC  --   --  0.64  --   --   CREATININE 1.23*  --   --  1.30* 1.25*   < > = values in this interval not displayed.   Estimated Creatinine Clearance: 52.5 mL/min (A) (by C-G formula based on SCr of 1.25 mg/dL (H)).  Medical History: Past Medical History:  Diagnosis Date  . Allergy   . Arthritis   . Asthma   . Cancer [facility])    R breast cancer  . Diabetes mellitus without complication (HCC)    no meds now  . Family history of adverse reaction to anesthesia     "entire family takes a long time to wake up- N&V for several days"  . Family history of lung cancer   . Hypertension   . Migraine   . PONV (postoperative nausea and vomiting)     Scheduled:  . acetaminophen  500 mg Oral TID WC  . atenolol  100 mg Oral Daily  . feeding supplement  1 Container Oral BID BM  . feeding supplement (PRO-STAT SUGAR FREE 64)  30 mL Oral BID  . fentaNYL  1 patch Transdermal Q72H  . hydrocortisone cream   Topical BID  . insulin aspart  0-15 Units Subcutaneous Q4H  . mouth rinse  15 mL Mouth Rinse BID  . multivitamin with minerals  1 tablet Oral Daily  . nystatin  5 mL Oral QID  . promethazine  12.5 mg Oral Q6H  . senna  2 tablet Oral BID  . sucralfate  1 g Oral TID WC & HS   Assessment: 53 yoF with PMH metastatic breast CA, HTN, HLD, DM2, asthma, admitted for left hemithorax after failed trial with PleurX catheter. Due to pain and swelling in LUE, vascular ultrasound was performed which revealed  acute DVT in L IJ, subclavian, axillary, and brachial veins. Pharmacy consulted to start IV heparin, dosed conservatively and without bolus. Note patient may need palliative thoracentesis in the near future.   Baseline INR, aPTT: not done  Prior anticoagulation: Lovenox ppx - 40 mg SQ daily; LD 7/4 at 0925  Planned to change from Lovenox to Xarelto this AM - last Lovenox dose given at 1725 on 7/5 - pt and family currently discussing placement of tunneled IV catheter, holding Xarelto, resume Lovenox  Anticipate change back to Xarelto for DVT treatment after catheter placement (or if catheter declined 7/7), then would resume Xarelto 15mg  BID x 21 days then change dose to 20mg  once daily  Significant events:  Today, 11/11/2019: Holding Xarelto, resume Lovenox  Goal of Therapy: Monitor CBC, SCr, signs/symptoms bleeding Monitor platelets by anticoagulation protocol: Yes  Plan:  Lovenox 80mg  SQ q12  Will need to hold Lovenox for catheter placement per IR  direction  Minda Ditto PharmD Pager [phone] 11/11/2019, 3:58 PM   1900 Addendum Re-checked patient chart to make sure Lovenox was charted, and wanted to adjust 7/7 am dose for later than 6am if patient decided for IV catheter placement.  Did not want to delay treatment if Lovenox given early.  Noticed that Lovenox order discontinued by MD, and progress note stated transition to [place] today 7/7.  Verified with daughter that catheter would not be placed, resumed Xarelto to start 7/6 evening.   - noted plan for U/S of shoulder, possible hematoma, CBC low-stable  Xarelto 15mg  bid with meals x 21 days, then 20 daily with a meal on 7/28  Minda Ditto PharmD 11/11/2019, 7:22 PM

## 2019-11-11 NOTE — Progress Notes (Signed)
Request to IR for tunneled central venous catheter placement (same catheter as PICC but placed in IJ) for durable venous access due to restricted right upper extremity and left upper extremity with extensive DVT. Per oncology patient is planned for PO chemotherapy only and does not require a port from their perspective.  Presented to patient's room twice this afternoon to discuss central line indications, risks, benefits and alternatives. Patient's daughter is at bedside during discussion. They are wondering why a port would not be the better option in her scenario. We discussed that a port would be a more invasive procedure which requires moderate sedation and is therefore the riskier procedure of the two - additionally to remove the port she would need to come back as an outpatient and again be put under moderate anesthesia. For tunneled central line placement this would only require lidocaine for placement and is a smaller bore catheter than the port, additionally the central line could be removed without sedation either here in the hospital or potentially at a SNF. We discussed that there are infectious risks with either central line however if a port were to become infected it would require further invasive procedures whereas a tunneled central line would not. Lastly, the risk of bleeding/pocket hematoma formation with port placement is significantly higher than with tunneled central line. Patient's daughter asking about accidental removal of tunneled central line and bleeding risk - we discussed that because this is a small caliber catheter that is placed in a vein the risk of significant, life threatening bleeding is minimal and any bleeding would only require firm pressure at the IJ for 5-10 minutes in most cases. The catheter would have a cuff which the skin grows around to help hold it in place and would also be sutured in, making the risk of inadvertent removal small. Additionally, we discussed that  there are not current plans to continue outpatient IV medications and this line would be placed just to ensure future IV access.  Patient and her daughter state understanding to all of the above, they do not have any further questions for me today but ask that we follow up tomorrow AM as they would like to discuss it overnight. They are leaning towards no procedure at this current time but would like to discuss it further before making a final decision.   IR PA will follow up tomorrow AM for possible procedure planning. Ok to restart anticoagulation and diet as there are no plans to place a port.   Please call IR with questions or concerns.  Candiss Norse, PA-C

## 2019-11-11 NOTE — Progress Notes (Signed)
Pain medication was offered during the 2200 med. pass, However, pt refused by spitting medication out. This RN later went to check on pt. And pt. was moaning and physically looked as she was in pain. When offered pain meds. Pt refused. This RN also offered to reposition and daughter at bedside refused.  Therefore, pt remained laying on left side most of the night. Pt. Used bedside commode x2 during the night and tolerated well. Bladder scanned after using commode, 64 cc was present. Daughter at bedside expressed she does not want mom to have Foley cath. placed.

## 2019-11-11 NOTE — Progress Notes (Addendum)
[place] for Xarelto - transition off of Lovenox Indication: DVT  Allergies  Allergen Reactions  . Penicillins Hives    Did it involve swelling of the face/tongue/throat, SOB, or low BP? Yes--syncope episode Did it involve sudden or severe rash/hives, skin peeling, or any reaction on the inside of your mouth or nose? No Did you need to seek medical attention at a hospital or doctor's office? Yes When did it last happen? 37-38 YEARS AGO If all above answers are "NO", may proceed with cephalosporin use.   . Metformin And Related Diarrhea  . Adhesive [Tape] Other (See Comments)    "severe bruising"  ALSO SKIN GLUE  . Asa [Aspirin] Other (See Comments)    GI upset   . Mango Flavor Hives  . Olive Tree Hives    Pt tolerates olive oil    Patient Measurements: Height: _0  (167.6 cm) Weight: 78.6 kg (173 lb 3.2 oz) IBW/kg (Calculated) : 59.3 Heparin Dosing Weight: TBW  Vital Signs: Temp: 97.4 F (36.3 C) (07/06 0451) Temp Source: Oral (07/06 0451) BP: 134/72 (07/06 0451) Pulse Rate: 80 (07/06 0451)  Labs: Recent Labs    11/09/19 0556 11/09/19 0556 11/09/19 2242 11/10/19 1043 11/11/19 0511  HGB 8.3*   < >  --  7.7* 7.4*  HCT 26.6*  --   --  23.3* 23.4*  PLT 403*  --   --  338 323  LABPROT  --   --   --   --  19.0*  INR  --   --   --   --  1.7*  HEPARINUNFRC  --   --  0.64  --   --   CREATININE 1.23*  --   --  1.30* 1.25*   < > = values in this interval not displayed.    Estimated Creatinine Clearance: 52.5 mL/min (A) (by C-G formula based on SCr of 1.25 mg/dL (H)).   Medical History: Past Medical History:  Diagnosis Date  . Allergy   . Arthritis   . Asthma   . Cancer [facility])    R breast cancer  . Diabetes mellitus without complication (HCC)    no meds now  . Family history of adverse reaction to anesthesia    "entire family takes a long time to wake up- N&V for several days"  . Family history of lung cancer    . Hypertension   . Migraine   . PONV (postoperative nausea and vomiting)     Medications:  Medications Prior to Admission  Medication Sig Dispense Refill Last Dose  . acetaminophen (TYLENOL) 500 MG tablet Take 1 tablet (500 mg total) by mouth 3 (three) times daily. 30 tablet 0 10/15/2019 at Unknown time  . albuterol (VENTOLIN HFA) 108 (90 Base) MCG/ACT inhaler Inhale 2 puffs into the lungs every 4 (four) hours as needed for wheezing or shortness of breath (cough, shortness of breath or wheezing.). 1 Inhaler 1 10/15/2019 at Unknown time  . atenolol (TENORMIN) 100 MG tablet TAKE 1 TABLET BY MOUTH EVERY DAY (Patient taking differently: Take 100 mg by mouth at bedtime. ) 30 tablet 5 10/15/2019 at 7pm  . fluticasone-salmeterol (ADVAIR HFA) 115-21 MCG/ACT inhaler Inhale 2 puffs into the lungs 2 (two) times daily. 1 Inhaler 12 10/15/2019 at Unknown time  . ibuprofen (ADVIL) 200 MG tablet Take 1 tablet (200 mg total) by mouth every 8 (eight) hours as needed for moderate pain. 30 tablet 0 Past Week at Unknown time  .  KLOR-CON M20 20 MEQ tablet Take 20 mEq by mouth at bedtime.    10/15/2019 at Unknown time  . pantoprazole (PROTONIX) 40 MG tablet Take 1 tablet (40 mg total) by mouth daily. (Patient taking differently: Take 40 mg by mouth at bedtime. ) 30 tablet 0 10/15/2019 at Unknown time  . traMADol (ULTRAM) 50 MG tablet Take 1 tablet (50 mg total) by mouth every 6 (six) hours as needed for severe pain. 10 tablet 0 10/15/2019 at Unknown time  . Vitamin D, Ergocalciferol, (DRISDOL) 1.25 MG (50000 UNIT) CAPS capsule TAKE 1 CAPSULE BY MOUTH ONE TIME PER WEEK (Patient taking differently: Take 50,000 Units by mouth every Saturday. ) 4 capsule 5 10/11/2019  . blood glucose meter kit and supplies KIT Dispense based on patient and insurance preference. Use up to four times daily as directed. (FOR ICD-9 250.00, 250.01). 1 each 0   . capecitabine (XELODA) 500 MG tablet Take 3 tablets (1,500 mg total) by mouth 2 (two) times daily  after a meal. Take daily for 14 days, then do not take for 7 days, then repeat. 84 tablet 6   . glucose blood (ACCU-CHEK ACTIVE STRIPS) test strip Use as instructed- check blood sugar up to 2x a day 100 each 12    Scheduled:  . acetaminophen  500 mg Oral TID WC  . atenolol  100 mg Oral Daily  . feeding supplement  1 Container Oral BID BM  . feeding supplement (PRO-STAT SUGAR FREE 64)  30 mL Oral BID  . fentaNYL  1 patch Transdermal Q72H  . fentaNYL  1 patch Transdermal Q72H  . hydrocortisone cream   Topical BID  . insulin aspart  0-15 Units Subcutaneous Q4H  . magic mouthwash w/lidocaine  5 mL Oral QID  . mouth rinse  15 mL Mouth Rinse BID  . multivitamin with minerals  1 tablet Oral Daily  . nystatin  5 mL Oral QID  . promethazine  12.5 mg Oral Q6H  . senna  2 tablet Oral BID  . sucralfate  1 g Oral TID WC & HS   Infusions:  . sodium chloride      Assessment: 10 yoF with PMH metastatic breast CA, HTN, HLD, DM2, asthma, admitted for left hemithorax after failed trial with PleurX catheter. Due to pain and swelling in LUE, vascular ultrasound was performed which revealed acute DVT in L IJ, subclavian, axillary, and brachial veins. Pharmacy consulted to start IV heparin, dosed conservatively and without bolus. Note patient may need palliative thoracentesis in the near future.   Baseline INR, aPTT: not done  Prior anticoagulation: Lovenox ppx - 40 mg SQ daily; LD 7/4 at 0925  Significant events:  Today, 11/11/2019:  Changing Lovenox to Xarelto this AM - last Lovenox dose given at 1725 on 7/5  CBC: Hgb low but stable (likely d/t oral chemo); Plt stable  No bleeding or infusion issues per nursing  SCr stable, CrCl 53  Goal of Therapy: Monitor CBC, SCr, signs/symptoms bleeding Monitor platelets by anticoagulation protocol: Yes  Plan: Note that patient needs central IV access per Md so holding all anticoag for time being until this is completed  D/C Lovenox  Start Xarelto  27m BID x 21 days then change dose to 257monce daily after Port or PICC line placed per Md  Will counsel prior to discharge  Daily CBC, daily heparin level once stable  Monitor for signs of bleeding or thrombosis   JuAdrian SaranPharmD, BCPS 11/11/2019 7:44 AM

## 2019-11-11 NOTE — Progress Notes (Signed)
Autumn Cook   DOB:08/27/1960   GH#:[id number]   CVE#:[id number]  Subjective:  Autumn Cook is sitting up in recliner, about to use [facility], daughter in room, 2 nurses assisting   Objective: African-American woman  Vitals:   11/10/19 2032 11/11/19 0451  BP: 127/78 134/72  Pulse: 85 80  Resp: 18 20  Temp: 98 F (36.7 C) (!) 97.4 F (36.3 C)  SpO2: 98% 96%    Body mass index is 27.96 kg/m.  Intake/Output Summary (Last 24 hours) at 11/11/2019 0720 Last data filed at 11/11/2019 0544 Gross per 24 hour  Intake 374.25 ml  Output 650 ml  Net -275.75 ml        CBG (last 3)  Recent Labs    11/10/19 2024 11/11/19 0009 11/11/19 0436  GLUCAP 173* 171* 118*     Labs:  Lab Results  Component Value Date   WBC 10.7 (H) 11/11/2019   HGB 7.4 (L) 11/11/2019   HCT 23.4 (L) 11/11/2019   MCV 88.0 11/11/2019   PLT 323 11/11/2019   NEUTROABS 9.0 (H) 11/11/2019    _0 @  Urine Studies No results for input(s): UHGB, CRYS in the last 72 hours.  Invalid input(s): UACOL, UAPR, USPG, UPH, UTP, UGL, UKET, UBIL, UNIT, UROB, [place], UEPI, UWBC, Junie Panning [place], [place], [place]  Basic Metabolic Panel: Recent Labs  Lab 11/07/19 0506 11/07/19 0506 11/09/19 0556 11/09/19 0556 11/10/19 1043 11/11/19 0511  NA 140  --  141  --  141 140  K 4.7   < > 3.8   < > 3.8 3.6  CL 108  --  109  --  109 111  CO2 20*  --  21*  --  21* 19*  GLUCOSE 141*  --  153*  --  192* 138*  BUN 55*  --  60*  --  61* 56*  CREATININE 1.04*  --  1.23*  --  1.30* 1.25*  CALCIUM 9.6  --  9.2  --  9.1 8.8*   < > = values in this interval not displayed.   GFR Estimated Creatinine Clearance: 52.5 mL/min (A) (by C-G formula based on SCr of 1.25 mg/dL (H)). Liver Function Tests: Recent Labs  Lab 11/11/19 0511  AST 21  ALT 11  ALKPHOS 88  BILITOT 0.5  PROT 5.4*  ALBUMIN 1.9*   No results for input(s): LIPASE, AMYLASE in the last 168 hours. No results for input(s): AMMONIA in the last 168 hours. Coagulation  profile Recent Labs  Lab 11/11/19 0511  INR 1.7*    CBC: Recent Labs  Lab 11/07/19 0506 11/08/19 0440 11/09/19 0556 11/10/19 1043 11/11/19 0511  WBC 10.7* 10.7* 11.4* 11.5* 10.7*  NEUTROABS 8.4* 8.8* 9.5* 10.0* 9.0*  HGB 8.2* 8.4* 8.3* 7.7* 7.4*  HCT 26.2* 27.3* 26.6* 23.3* 23.4*  MCV 91.3 90.7 89.0 87.6 88.0  PLT 484* 489* 403* 338 323   Cardiac Enzymes: No results for input(s): CKTOTAL, CKMB, CKMBINDEX, TROPONINI in the last 168 hours. BNP: Invalid input(s): POCBNP CBG: Recent Labs  Lab 11/10/19 0733 11/10/19 1222 11/10/19 2024 11/11/19 0009 11/11/19 0436  GLUCAP 138* 174* 173* 171* 118*   D-Dimer No results for input(s): DDIMER in the last 72 hours. Hgb A1c No results for input(s): HGBA1C in the last 72 hours. Lipid Profile No results for input(s): CHOL, HDL, LDLCALC, TRIG, CHOLHDL, LDLDIRECT in the last 72 hours. Thyroid function studies No results for input(s): TSH, T4TOTAL, T3FREE, THYROIDAB in the last 72 hours.  Invalid input(s): FREET3 Anemia work  up No results for input(s): VITAMINB12, FOLATE, FERRITIN, TIBC, IRON, RETICCTPCT in the last 72 hours. Microbiology Recent Results (from the past 240 hour(s))  Body fluid culture (includes gram stain)     Status: None (Preliminary result)   Collection Time: 11/10/19 12:00 PM   Specimen: Pleural Fluid  Result Value Ref Range Status   Specimen Description   Final    PLEURAL Performed at [facility] [address] [place], [place] [address]    Special Requests   Final    NONE Performed at [facility], [place] [address], [place], [place] [address]    Gram Stain   Final    NO WBC SEEN NO ORGANISMS SEEN Performed at [facility], [place] [address], [place], [place] [address]    Culture PENDING  Incomplete   Report Status PENDING  Incomplete      Studies:  CT CHEST WO CONTRAST  Result Date: 11/09/2019 CLINICAL DATA:  Pleural effusion. Malignancy is  suspected. Metastatic breast cancer. Severe LEFT chest pain. Swelling of the LEFT arm. EXAM: CT CHEST WITHOUT CONTRAST TECHNIQUE: Multidetector CT imaging of the chest was performed following the standard protocol without IV contrast. COMPARISON:  10/20/2019 FINDINGS: Cardiovascular: Heart and mediastinal structures are shifted towards the RIGHT. Heart is otherwise unremarkable. The noncontrast appearance of the aorta and pulmonary arteries is unremarkable. Mediastinum/Nodes: Mediastinal shift towards the RIGHT. Significant artifact limits evaluation of the thyroid gland. Enlarged LEFT axillary lymph nodes, measuring up to 11 millimeter short axis. No mediastinal adenopathy. Esophagus is unremarkable. Lungs/Pleura: Small Pleurx catheter is identified at the base of the LEFT pleural space. The entire LEFT lung is collapsed. Posteriorly, low-attenuation collection appears more discrete and spans the LEFT hemithorax. Collection measures 10.6 x 9.6 x 15.5 centimeters. Small amount of air is identified in the nondependent apical portion of this collection. Density measurements of this low-attenuation collection are indeterminate for simple fluid. A second small collection is identified in the anterior pleural space and also contains a small amount of air and measures 5.0 x 3.1 by 3.5 centimeters. Thickened, irregular pleura. The LEFT hemithorax is expanded, as before. Upper Abdomen: Low-attenuation lesion within the liver measures at least 3.8 centimeters. Musculoskeletal: No fracture is seen. Other: Irregular low-attenuation mass is identified in the posterior aspect of the RIGHT breast, 2.9 x 2.3 centimeters, likely site of prior lumpectomy. The LEFT breast is enlarged and not completely imaged. There is skin and trabecular thickening of the LEFT breast which is asymmetrically enlarged compared to the RIGHT. IMPRESSION: 1. Large low-attenuation collection involving the LEFT hemithorax, measuring 10.6 x 9.6 x 15.5  centimeters. Small amount of air in the nondependent apical portion of this collection. A second small collection is identified in the anterior pleural space and also contains a small amount of air. Persistent expansion of the LEFT hemithorax with mediastinal shift towards the RIGHT. 2. Enlarged LEFT axillary lymph nodes, suspicious for metastatic disease. 3. Irregular low-attenuation mass in the posterior aspect of the RIGHT breast, likely site of prior lumpectomy. 4. Enlarged LEFT breast with skin and trabecular thickening of the LEFT breast which is asymmetrically enlarged compared to the RIGHT. 5. Stable low-attenuation lesion within the liver, measuring at least 3.8 centimeters. 6. Aortic Atherosclerosis (ICD10-I70.0). Electronically Signed   By: Nolon Nations M.D.   On: 11/09/2019 14:43   DG CHEST PORT 1 VIEW  Result Date: 11/10/2019 CLINICAL DATA:  Status post thoracentesis. EXAM: PORTABLE CHEST 1 VIEW COMPARISON:  Single-view of  the chest 10/29/2019. CT chest 11/29/2019. FINDINGS: Complete whiteout of the left chest is worse than on the prior plain film where there was some aerated lung in the apex. No pneumothorax. Left pleural drainage catheter is in place. Right lung appears clear. The cardiac silhouette is largely obscured. IMPRESSION: Negative for pneumothorax after thoracentesis. Complete whiteout of the left chest consistent due to a pleural effusion and airspace disease. Pleural drainage catheter is in place in the left chest. Electronically Signed   By: Inge Rise M.D.   On: 11/10/2019 15:25   VAS [place] UPPER EXTREMITY VENOUS DUPLEX  Result Date: 11/10/2019 UPPER VENOUS STUDY  Indications: Pain, and Swelling Limitations: Poor ultrasound/tissue interface and line. Comparison Study: No prior studies. Performing Technologist: Oliver Hum RVT  Examination Guidelines: A complete evaluation includes B-mode imaging, spectral Doppler, color Doppler, and power Doppler as needed of all  accessible portions of each vessel. Bilateral testing is considered an integral part of a complete examination. Limited examinations for reoccurring indications may be performed as noted.  Right Findings: +----------+------------+---------+-----------+----------+-------+  RIGHT      Compressible Phasicity Spontaneous Properties Summary  +----------+------------+---------+-----------+----------+-------+  Subclavian     Full        Yes        Yes                         +----------+------------+---------+-----------+----------+-------+  Left Findings: +----------+------------+---------+-----------+----------+-------+  LEFT       Compressible Phasicity Spontaneous Properties Summary  +----------+------------+---------+-----------+----------+-------+  IJV            None        No         No                  Acute   +----------+------------+---------+-----------+----------+-------+  Subclavian     None        No         No                  Acute   +----------+------------+---------+-----------+----------+-------+  Axillary       None        No         No                  Acute   +----------+------------+---------+-----------+----------+-------+  Brachial       None        No         No                  Acute   +----------+------------+---------+-----------+----------+-------+  Radial         Full                                               +----------+------------+---------+-----------+----------+-------+  Ulnar          Full                                               +----------+------------+---------+-----------+----------+-------+  Cephalic       Full                                               +----------+------------+---------+-----------+----------+-------+  Basilic        None                                               +----------+------------+---------+-----------+----------+-------+  Summary:  Right: No evidence of thrombosis in the subclavian.  Left: Findings consistent with acute deep vein  thrombosis involving the left internal jugular vein, left subclavian vein, left axillary vein and left brachial veins. Findings consistent with acute superficial vein thrombosis involving the left basilic vein.  *See table(s) above for measurements and observations.  Diagnosing physician: Monica Martinez MD Electronically signed by Monica Martinez MD on 11/10/2019 at 10:55:45 AM.    Final     Assessment: 59 y.o. [place], [place] woman status post right breast biopsy x2 on 07/10/2018 for multicentric invasive ductal carcinoma, clinically T1c N1, stage IIB, grade 3, functionally triple negative, with an MIB-1 of 70%             (a) right axillary lymph node biopsied at the same time was positive  (1) neoadjuvant chemotherapy consisting of doxorubicin and cyclophosphamide in dose dense fashion x4 started 08/07/2018, completed 09/16/2018, followed by weekly Abraxane and carboplatin x12 starting 08/07/2018, stopped after 2 cycles (last dose on 10/15/2018) due to peripheral neuropathy   (2) Right breast lumpectomy on 12/27/2018 shows a ypT1b ypN0 residual invasive ductal carcinoma, margins negative.             (a) 5 sentinel nodes were negative.              (b) Estrogen receptor 75% positive, weak, Progesterone receptor negative.  (3) adjuvant radiation  Radiation Treatment Dates: 02/18/2019 through 04/02/2019 Site Technique Total Dose (Gy) Dose per Fx (Gy) Completed Fx Beam Energies  Breast: Breast_Rt 3D 50.4/50.4 1.8 28/28 6X, 10X  Breast: Breast_Rt_axilla 3D 50.4/50.4 1.8 28/28 6X, 10X  Breast: Breast_Rt_Bst 3D 20/20 2 10/10 6X, 10X    (4) genetics testing 08/06/2018 through the Breast + GYN Cancers Panel offered by [facility] found no deleterious mutations in ATM, BARD1, BRCA1, BRCA2, BRIP1, CDH1, CHEK2, DICER1, EPCAM, MLH1, MSH2, MSH6, NBN, NF1, PALB2, PMS2, PTEN, RAD50, RAD51C, RAD51D, SMARCA4, STK11, TP53.   (5) anastrozole started 05/14/2018             (a) bone density 09/22/2019 shows a  T score of -0.8 (normal).  METASTATIC DISEASE: June 2021 (6) CT renalstonestudy 09/30/2019 to evaluate left flank pain shows left pleural effusion and possible liver lesions (a)cytology from the left pleural effusion 10/03/2019 confirms metastatic disease (b)liver MRI 10/04/2019 shows multiple liver lesions (c) prognostic panel requested 10/10/2019 on cytology from 10/03/2019 fluid  (7) left effusion: left pleurx placement 10/13/2019  (a) Ct chest 11/09/2019 shows continuing left lung collapse, loculated pockets of fluid  (8) DVT left internal jugular, subclavian, axillary and brachial vv diagnosed by doppler 11/09/2019  (a) IV heparin started 11/09/2019  (9) capecitabine started 10/28/2019 at standard doses  (a) cycle 2 to resume 11/18/2019   PLAN: Katey asked me to come back later today as she is about to "do number 2." Assessment update to reflect completion of first cycle of capecitabine, which she tolerated remarkably well, and new dx of LUE DVT, currently on heparin.  She may remain on heparin while in hospital but there is data for transitioning to rivaroxaban in cancer patients; I would be comfortable with that at hospitalist's discretion.  We  will resume capecitabine 11/18/2019. All treatment is potentially outpatient.  Appreciate palliative care team's interventions and hospitalists care to this patient and family. Goals are for discharge most likely to an SNF/Rehab facility until patient is able to participate in her care enough that family can take her home with 24 hr support  Will follow with you        Chauncey Cruel, MD 11/11/2019  7:20 AM Medical Oncology and Hematology [facility] [place], [place] [address] Tel. [phone]    Fax. [phone]

## 2019-11-11 NOTE — Progress Notes (Addendum)
Palliative care brief progress note  I checked in briefly with Autumn Cook and her daughter today.  Plan is for thoracentesis later today.  Pain remains well controlled.  While I am going off service, I will ask someone from PMT to check in later this week.  Please call if there are specific palliative needs in the interim.  Micheline Rough, MD [facility] Team [phone]  NO CHARGE NOTE

## 2019-11-12 ENCOUNTER — Inpatient Hospital Stay (HOSPITAL_COMMUNITY): Payer: 59

## 2019-11-12 ENCOUNTER — Encounter (HOSPITAL_COMMUNITY): Payer: 59

## 2019-11-12 ENCOUNTER — Institutional Professional Consult (permissible substitution): Payer: 59 | Admitting: Pulmonary Disease

## 2019-11-12 DIAGNOSIS — I82409 Acute embolism and thrombosis of unspecified deep veins of unspecified lower extremity: Secondary | ICD-10-CM

## 2019-11-12 DIAGNOSIS — I82A12 Acute embolism and thrombosis of left axillary vein: Secondary | ICD-10-CM

## 2019-11-12 LAB — COMPREHENSIVE METABOLIC PANEL
ALT: 11 U/L (ref 0–44)
AST: 20 U/L (ref 15–41)
Albumin: 1.9 g/dL — ABNORMAL LOW (ref 3.5–5.0)
Alkaline Phosphatase: 82 U/L (ref 38–126)
Anion gap: 8 (ref 5–15)
BUN: 56 mg/dL — ABNORMAL HIGH (ref 6–20)
CO2: 21 mmol/L — ABNORMAL LOW (ref 22–32)
Calcium: 8.9 mg/dL (ref 8.9–10.3)
Chloride: 110 mmol/L (ref 98–111)
Creatinine, Ser: 1.21 mg/dL — ABNORMAL HIGH (ref 0.44–1.00)
GFR calc Af Amer: 58 mL/min — ABNORMAL LOW (ref >60)
GFR calc non Af Amer: 50 mL/min — ABNORMAL LOW (ref >60)
Glucose, Bld: 143 mg/dL — ABNORMAL HIGH (ref 70–99)
Potassium: 3.4 mmol/L — ABNORMAL LOW (ref 3.5–5.1)
Sodium: 139 mmol/L (ref 135–145)
Total Bilirubin: 0.4 mg/dL (ref 0.3–1.2)
Total Protein: 5.4 g/dL — ABNORMAL LOW (ref 6.5–8.1)

## 2019-11-12 LAB — CBC WITH DIFFERENTIAL/PLATELET
Abs Immature Granulocytes: 0.11 10*3/uL — ABNORMAL HIGH (ref 0.00–0.07)
Basophils Absolute: 0 10*3/uL (ref 0.0–0.1)
Basophils Relative: 0 %
Eosinophils Absolute: 0.1 10*3/uL (ref 0.0–0.5)
Eosinophils Relative: 1 %
HCT: 22.4 % — ABNORMAL LOW (ref 36.0–46.0)
Hemoglobin: 7.2 g/dL — ABNORMAL LOW (ref 12.0–15.0)
Immature Granulocytes: 1 %
Lymphocytes Relative: 12 %
Lymphs Abs: 1 10*3/uL (ref 0.7–4.0)
MCH: 28.1 pg (ref 26.0–34.0)
MCHC: 32.1 g/dL (ref 30.0–36.0)
MCV: 87.5 fL (ref 80.0–100.0)
Monocytes Absolute: 0.4 10*3/uL (ref 0.1–1.0)
Monocytes Relative: 4 %
Neutro Abs: 7 10*3/uL (ref 1.7–7.7)
Neutrophils Relative %: 82 %
Platelets: 268 10*3/uL (ref 150–400)
RBC: 2.56 MIL/uL — ABNORMAL LOW (ref 3.87–5.11)
RDW: 15.4 % (ref 11.5–15.5)
WBC: 8.5 10*3/uL (ref 4.0–10.5)
nRBC: 0.2 % (ref 0.0–0.2)

## 2019-11-12 LAB — GLUCOSE, CAPILLARY
Glucose-Capillary: 109 mg/dL — ABNORMAL HIGH (ref 70–99)
Glucose-Capillary: 140 mg/dL — ABNORMAL HIGH (ref 70–99)
Glucose-Capillary: 155 mg/dL — ABNORMAL HIGH (ref 70–99)
Glucose-Capillary: 185 mg/dL — ABNORMAL HIGH (ref 70–99)
Glucose-Capillary: 212 mg/dL — ABNORMAL HIGH (ref 70–99)
Glucose-Capillary: 258 mg/dL — ABNORMAL HIGH (ref 70–99)

## 2019-11-12 LAB — CYTOLOGY - NON PAP

## 2019-11-12 MED ORDER — MORPHINE SULFATE (PF) 2 MG/ML IV SOLN
1.0000 mg | INTRAVENOUS | Status: AC
  Administered 2019-11-12: 1 mg via INTRAVENOUS
  Filled 2019-11-12: qty 1

## 2019-11-12 MED ORDER — MORPHINE SULFATE (PF) 2 MG/ML IV SOLN
INTRAVENOUS | Status: AC
  Filled 2019-11-12: qty 1

## 2019-11-12 MED ORDER — KETOROLAC TROMETHAMINE 15 MG/ML IJ SOLN
15.0000 mg | Freq: Once | INTRAMUSCULAR | Status: AC
  Administered 2019-11-12: 15 mg via INTRAVENOUS
  Filled 2019-11-12: qty 1

## 2019-11-12 MED ORDER — LIDOCAINE HCL 2 % IJ SOLN
INTRAMUSCULAR | Status: AC
  Administered 2019-11-12: 400 mg
  Filled 2019-11-12: qty 20

## 2019-11-12 MED ORDER — KATE FARMS STANDARD 1.4 PO LIQD
325.0000 mL | Freq: Every day | ORAL | Status: DC
  Administered 2019-11-12: 325 mL via ORAL
  Filled 2019-11-12 (×2): qty 325

## 2019-11-12 NOTE — Plan of Care (Signed)
°  Problem: Health Behavior/Discharge Planning: Goal: Ability to manage health-related needs will improve Outcome: Progressing   Problem: Clinical Measurements: Goal: Ability to maintain clinical measurements within normal limits will improve Outcome: Progressing Goal: Diagnostic test results will improve Outcome: Progressing   Problem: Activity: Goal: Risk for activity intolerance will decrease Outcome: Progressing   Problem: Nutrition: Goal: Adequate nutrition will be maintained Outcome: Progressing

## 2019-11-12 NOTE — Assessment & Plan Note (Signed)
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

## 2019-11-12 NOTE — Assessment & Plan Note (Signed)
-   see breast cancer

## 2019-11-12 NOTE — Progress Notes (Signed)
Nutrition Follow-up  INTERVENTION:   -Continue Boost Breeze po TID, each supplement provides 250 kcal and 9 grams of protein -[employer] 1.4 daily, each provides 455 kcals and 20g protein -Prostat liquid protein PO 30 ml BID with meals, each supplement provides 100 kcal, 15 grams protein. -try with juice  NUTRITION DIAGNOSIS:   Inadequate oral intake related to poor appetite as evidenced by meal completion < 50%.  Ongoing.  GOAL:   Patient will meet greater than or equal to 90% of their needs  Progressing.  MONITOR:   PO intake, Supplement acceptance, Weight trends, Labs, I & O's  REASON FOR ASSESSMENT:   Consult Assessment of nutrition requirement/status, Poor PO, Diet education  ASSESSMENT:   Pt presented with opacification of L hemithorax w/ recent diagnosis of L malignant pleural effusion 2/2 breast cancer. PMH includes breast cancer, HTN, HLD, T2DM, asthma.  Patient sleeping, daughter at bedside.  Pt's daughter states she requested RD come by so she could brainstorm some ideas for when pt goes home.   States currently pt is only eating ~25% of meals (this is consistent with what is documented). Pt is not really wanting to chew food at this time and it's easer to get her to drink beverages. Pt is really liking fruit flavored juices and the Boost Breeze. Pt does not like Ensure or anything milky. Daughter agreed to pt trying [employer] as it is plant based.  Daughter is concerned about pt's blood sugar but also wants pt to eat. Reviewed ideas on how to make a high calorie, high protein smoothie for pt to drink at home. Pt's daughter appreciative for ideas. Will continue Boost Breeze at this time. Pt is not taking Prostat, may need to add to juice.    Admission weight: 177 lbs. Current weight: 173 lbs.  Medications: Multivitamin with minerals daily, Phenergan, Senokot, Carafate  Labs reviewed: CBGs: 109-185 Low K  Diet Order:   Diet Order            Diet regular  Room service appropriate? Yes; Fluid consistency: Thin  Diet effective now                 EDUCATION NEEDS:   Education needs have been addressed  Skin:  Skin Assessment: Reviewed RN Assessment  Last BM:  6/30  Height:   Ht Readings from Last 1 Encounters:  10/16/19 5\' 6"  (1.676 m)    Weight:   Wt Readings from Last 1 Encounters:  11/09/19 78.6 kg   BMI:  Body mass index is 27.96 kg/m.  Estimated Nutritional Needs:   Kcal:  1950-2150  Protein:  95-110 grams  Fluid:  >1.95L  Clayton Bibles, MS, RD, LDN Inpatient Clinical Dietitian Contact information available via Amion

## 2019-11-12 NOTE — Assessment & Plan Note (Signed)
-   completed course of cefepime

## 2019-11-12 NOTE — Assessment & Plan Note (Signed)
-   Status post lumpectomy 12/2018 with neoadjuvant treatment as guided by 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 - CT performed 7/4 and on 7/5 had 600 cc of fluid drained as per pulmonology at the bedside - prognosis appears poor; patient's quality life is also poor; she has been seen by [facility] and PCCM - patient also appears to not understand gravity of her diagnosis and daughters seem to be in denial; her overall prognosis is poor - continue outpatient care with oncology if she wishes to do so

## 2019-11-12 NOTE — Hospital Course (Addendum)
Autumn Cook is a 58 year old African-American female with Stage IV breast cancer (mets to liver). She has had a pleurx cath that became occluded and was subsequently removed on 11/12/19.  During this hospitalization she has also been found to have a LUE DVT.  She was evaluated by pulmonology and considered not to be a candidate for pleurodesis 2/2 trapped lung.  She has been evaluated by oncology and palliative care during hospitalization with plans for returning home at discharge to continue palliative treatment of her Stage IV metastatic breast cancer.  She was prescribed Xarelto at discharge and will follow up with oncology to resume care.  Pain was controlled with Fentanyl patch and tramadol PRN. Scripts provided at discharge.

## 2019-11-12 NOTE — Progress Notes (Signed)
PROGRESS NOTE    Autumn Cook   OKH:[id number]  DOB: [birth year]  DOA: 10/16/2019     27  PCP: Darreld Mclean, MD  CC: SOB  Hospital Course: Ms. Drach is a 59 year old African-American female with Stage IV breast cancer (mets to liver). She has had a pleurx cath that became occluded and was subsequently removed on 11/12/19.  During this hospitalization she has also been found to have a LUE DVT.  She was evaluated by pulmonology and considered not to be a candidate for pleurodesis 2/2 trapped lung.  She has been evaluated by oncology and palliative care during hospitalization with plans for returning home at discharge to continue palliative treatment of her Stage IV metastatic breast cancer.    Interval History:  No events overnight.  Resting in bed appearing comfortable but frail.  Daughter is bedside and states the plan is to take her home once she is controlled on oral medications.  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; she is not a pleurodesis candidate - pleurx cath removed on 11/12/19 per pulm  Cancer associated pain - continue pain control   Acute respiratory failure with hypoxia ([place]) - given severity of left lung on imaging she is likely to remain O2 dependent  Hypertension - continue atenolol   History of tobacco use -Ongoing cessation commended  Pneumonia of right lower lobe due to infectious organism - completed course of cefepime   Malignant neoplasm of overlapping sites of right breast in female, estrogen receptor positive ([place]) - Status post lumpectomy  12/2018 with neoadjuvant treatment as guided by 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 - CT performed 7/4 and on 7/5 had 600 cc of fluid drained as per pulmonology at the bedside - prognosis appears poor; patient's quality life is also poor; she has been seen by [facility] and PCCM - patient also appears to not understand gravity of her diagnosis and daughters seem to be in denial; her overall prognosis is poor - continue outpatient care with oncology if she wishes to do so   Metastases to the liver [facility]) - see breast cancer  DVT (deep venous thrombosis) ([place]) - extensive LUE DVT - continue xarelto - follow up RUE u/s   Antimicrobials: Cefepime 10/31/2019 to 11/06/2019  DVT prophylaxis: DOAC Code Status: Full Family Communication: Daughter bedside Disposition Plan:  . Patient came from: Home        . Barriers to d/c OR conditions which need to be met to effect a safe d/c: None . The current disposition plan is discharge to: Home tomorrow   Objective: Vitals:   11/12/19 0634 11/12/19 1026 11/12/19 1046 11/12/19 1445  BP: 127/72 125/69 124/84 122/77  Pulse: 70 83 80 75  Resp: 12   18  Temp: (!) 97.5 F (36.4 C)   (!) 97.4 F (36.3 C)  TempSrc: Oral   Oral  SpO2: 100% 100% 100% 100%  Weight:      Height:        Intake/Output Summary (Last 24 hours) at 11/12/2019 1725 Last data filed at 11/12/2019 1400 Gross per 24 hour  Intake 620 ml  Output 375 ml  Net 245 ml   Filed Weights   10/27/19 1854 10/28/19 0406 11/09/19 0508  Weight: 83.1 kg 81.3 kg 78.6 kg    Examination: General appearance: alert, cooperative and no distress Head: Normocephalic, without obvious abnormality Eyes: EOMI Lungs: Dull breath sounds on the left side Heart: regular rate and rhythm and S1, S2 normal Abdomen: normal findings: bowel sounds normal Extremities: No edema Skin: mobility and turgor normal Neurologic: Grossly normal   Consultants:   Pulmonary critical  care  Oncology  Palliative care  Data Reviewed: I have personally reviewed following labs and imaging studies Results for orders placed or performed during the hospital encounter of 10/16/19 (from the past 24 hour(s))  Glucose, capillary     Status: Abnormal   Collection Time: 11/11/19  8:03 PM  Result Value Ref Range   Glucose-Capillary 228 (H) 70 - 99 mg/dL  Glucose, capillary     Status: Abnormal   Collection Time: 11/12/19 12:24 AM  Result Value Ref Range   Glucose-Capillary 155 (H) 70 - 99 mg/dL  Glucose, capillary     Status: Abnormal   Collection Time: 11/12/19  4:30 AM  Result Value Ref Range   Glucose-Capillary 140 (H) 70 - 99 mg/dL  Comprehensive metabolic panel     Status: Abnormal   Collection Time: 11/12/19  5:23 AM  Result Value Ref Range   Sodium 139 135 - 145 mmol/L   Potassium 3.4 (L) 3.5 - 5.1 mmol/L   Chloride 110 98 - 111 mmol/L   CO2 21 (L) 22 - 32 mmol/L   Glucose, Bld 143 (H) 70 - 99 mg/dL   BUN 56 (H) 6 - 20 mg/dL   Creatinine, Ser 1.21 (H) 0.44 - 1.00 mg/dL   Calcium 8.9 8.9 - 10.3 mg/dL   Total Protein 5.4 (L) 6.5 - 8.1 g/dL   Albumin 1.9 (L) 3.5 - 5.0 g/dL   AST 20 15 - 41 U/L   ALT 11 0 - 44 U/L   Alkaline Phosphatase 82 38 - 126 U/L   Total Bilirubin 0.4 0.3 - 1.2 mg/dL   GFR calc non Af Amer 50 (L) >60 mL/min   GFR calc Af Amer 58 (L) >60 mL/min   Anion gap 8 5 - 15  CBC with Differential/Platelet     Status: Abnormal   Collection Time: 11/12/19  5:23 AM  Result Value Ref Range   WBC 8.5 4.0 - 10.5 K/uL   RBC 2.56 (L) 3.87 - 5.11 MIL/uL   Hemoglobin 7.2 (L) 12.0 - 15.0 g/dL   HCT 22.4 (L) 36 - 46 %   MCV 87.5 80.0 - 100.0 fL   MCH 28.1 26.0 - 34.0 pg   MCHC 32.1 30.0 - 36.0 g/dL   RDW 15.4 11.5 - 15.5 %   Platelets 268 150 - 400 K/uL   nRBC 0.2 0.0 - 0.2 %   Neutrophils Relative % 82 %   Neutro Abs 7.0 1.7 - 7.7 K/uL   Lymphocytes Relative 12 %   Lymphs Abs 1.0 0.7 - 4.0 K/uL   Monocytes Relative 4 %   Monocytes Absolute 0.4 0 -  1 K/uL   Eosinophils Relative 1 %   Eosinophils Absolute 0.1 0 - 0 K/uL   Basophils Relative 0 %   Basophils Absolute 0.0 0 - 0 K/uL   Immature Granulocytes 1 %   Abs Immature Granulocytes 0.11 (H) 0.00 - 0.07 K/uL  Glucose, capillary     Status: Abnormal  Collection Time: 11/12/19  7:21 AM  Result Value Ref Range   Glucose-Capillary 109 (H) 70 - 99 mg/dL  Glucose, capillary     Status: Abnormal   Collection Time: 11/12/19 11:58 AM  Result Value Ref Range   Glucose-Capillary 185 (H) 70 - 99 mg/dL  Glucose, capillary     Status: Abnormal   Collection Time: 11/12/19  4:05 PM  Result Value Ref Range   Glucose-Capillary 212 (H) 70 - 99 mg/dL    Recent Results (from the past 240 hour(s))  Body fluid culture (includes gram stain)     Status: None (Preliminary result)   Collection Time: 11/10/19 12:00 PM   Specimen: Pleural Fluid  Result Value Ref Range Status   Specimen Description   Final    PLEURAL Performed at [facility] [address], [place], [place] [address]    Special Requests   Final    NONE Performed at [facility], [place] [address], [place], [place] [address]    Gram Stain NO WBC SEEN NO ORGANISMS SEEN   Final   Culture   Final    NO GROWTH 2 DAYS Performed at [facility], [place] [address], [place], [place] [address]    Report Status PENDING  Incomplete     Radiology Studies: No results found. DG CHEST PORT 1 VIEW  Final Result    VAS [place] UPPER EXTREMITY VENOUS DUPLEX  Final Result    CT CHEST WO CONTRAST  Final Result    DG CHEST PORT 1 VIEW  Final Result    DG CHEST PORT 1 VIEW  Final Result    DG Chest Port 1 View  Final Result    CT CHEST WO CONTRAST  Final Result    DG Chest 1 View  Final Result    DG Chest Port 1 View  Final Result    DG Chest Port 1 View  Final Result    DG CHEST PORT 1 VIEW  Final Result    [place] EKG SITE RITE  Final Result    [place] RT UPPER EXTREM LTD SOFT TISSUE NON  VASCULAR    (Results Pending)  IR Fluoro Guide CV Line Right    (Results Pending)     Scheduled Meds: . acetaminophen  500 mg Oral TID WC  . atenolol  100 mg Oral Daily  . feeding supplement  1 Container Oral BID BM  . feeding supplement (KATE FARMS STANDARD 1.4)  325 mL Oral Daily  . feeding supplement (PRO-STAT SUGAR FREE 64)  30 mL Oral BID  . fentaNYL  1 patch Transdermal Q72H  . hydrocortisone cream   Topical BID  . insulin aspart  0-15 Units Subcutaneous Q4H  . mouth rinse  15 mL Mouth Rinse BID  . multivitamin with minerals  1 tablet Oral Daily  . nystatin  5 mL Oral QID  . promethazine  12.5 mg Oral Q6H  . rivaroxaban  15 mg Oral BID WC   Followed by  . [START ON 12/03/2019] rivaroxaban  20 mg Oral Q supper  . senna  2 tablet Oral BID  . sucralfate  1 g Oral TID WC & HS   PRN Meds: antiseptic oral rinse, ipratropium-albuterol, levalbuterol, LORazepam, metoprolol tartrate, polyethylene glycol, prochlorperazine **OR** prochlorperazine, sorbitol, traMADol Continuous Infusions: . sodium chloride        LOS: 27 days  Time spent: Greater than 50% of the 35 minute visit was spent in counseling/coordination of care  for the patient as laid out in the A&P.   Dwyane Dee, MD [facility] 11/12/2019, 5:25 PM   Contact via secure chat.  To contact the attending provider between 7A-7P or the covering provider during after hours 7P-7A, please log into the web site [email or web address] and access using universal [place] password for that web site. If you do not have the password, please call the hospital operator.

## 2019-11-12 NOTE — Assessment & Plan Note (Addendum)
-   extensive LUE DVT - continue xarelto

## 2019-11-12 NOTE — Assessment & Plan Note (Signed)
-   continue atenolol

## 2019-11-12 NOTE — Assessment & Plan Note (Signed)
-   continue pain control ?

## 2019-11-12 NOTE — Assessment & Plan Note (Signed)
-  Ongoing cessation commended

## 2019-11-12 NOTE — Progress Notes (Signed)
[place] pulmonary and critical care medicine  Subjective:  No distress. Now s/p pleurx removal   On exam:  Vitals:   11/11/19 2100 11/12/19 0634 11/12/19 1026 11/12/19 1046  BP: 123/76 127/72 125/69 124/84  Pulse: 83 70 83 80  Resp: 16 12    Temp: (Abnormal) 97.4 F (36.3 C) (Abnormal) 97.5 F (36.4 C)    TempSrc: Oral Oral    SpO2: 100% 100% 100% 100%  Weight:      Height:        General this is a 59 year old female resting in bed. No distress. Now s/p Pleurx removal and a little more sleepy after morphine HENT NCAT no JVD MMM Pulm decreased on left no accessory use. Currently on 2 lpm Card RRR abd not tender  Ext warm LUE swollen and tender to palp  GU voids  Neuro intact    CBC    Component Value Date/Time   WBC 8.5 11/12/2019 0523   RBC 2.56 (L) 11/12/2019 0523   HGB 7.2 (L) 11/12/2019 0523   HGB 12.9 07/24/2018 0825   HCT 22.4 (L) 11/12/2019 0523   PLT 268 11/12/2019 0523   PLT 342 07/24/2018 0825   MCV 87.5 11/12/2019 0523   MCV 86.8 12/06/2011 1042   MCH 28.1 11/12/2019 0523   MCHC 32.1 11/12/2019 0523   RDW 15.4 11/12/2019 0523   LYMPHSABS 1.0 11/12/2019 0523   MONOABS 0.4 11/12/2019 0523   EOSABS 0.1 11/12/2019 0523   BASOSABS 0.0 11/12/2019 0523     Impression: Metastatic breast cancer Malignant pleural effusion> thick, doesn't drain well despite TPA Severe pain on left chest due to breast cancer  Discussion/Plan See notes from 7/7. Nothing we can offer further from pulm standpoint. Drainage of pleural space has yielded more discomfort and associated shortness of breath than improvement in these symptoms. This is likely due to trapped lung. Will need oxygen. 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   Plan pleurx out Further discussion re: oncology plan of care TBD based on activity tolerance and nutritional status Will need home O2  PCCM will sign off  Erick Colace ACNP-BC [place] Pager # [phone] OR # [phone] if no answer

## 2019-11-12 NOTE — Progress Notes (Signed)
I completed a follow up visit with the patient to provide spiritual care/support. I visited the patient's room where I found her resting. I spoke with her daughter about her mother's overall wellbeing. She shared how her mother was feeling better lately and is hopeful on going home. I shared that the Chaplain is available to support for the patient and her family also.    11/12/19 1359  Clinical Encounter Type  Visited With Patient and family together  Visit Type Follow-up;Spiritual support  Referral From Nurse  Consult/Referral To Chaplain    Chaplain Dr Redgie Grayer

## 2019-11-12 NOTE — Procedures (Signed)
Left Pleurx tube removal  Signed consent obtained 1 mg morphine IV given  Site prepped w/ sterile chlorhexidine and then localized w/ 1% lidocaine.  Using sterile technique the left barrier cuff was dissected free using sterile kelly clamps and constant gentle pull on Pleurx. Once the barrier cuff was free the chest tube was easily removed. Occlusive dressing placed  Pt tolerated well.   Erick Colace ACNP-BC [place] Pager # [phone] OR # [phone] if no answer

## 2019-11-12 NOTE — Assessment & Plan Note (Signed)
-   given severity of left lung on imaging she is likely to remain O2 dependent

## 2019-11-13 ENCOUNTER — Inpatient Hospital Stay: Payer: 59 | Admitting: Primary Care

## 2019-11-13 DIAGNOSIS — Z7189 Other specified counseling: Secondary | ICD-10-CM

## 2019-11-13 LAB — COMPREHENSIVE METABOLIC PANEL
ALT: 12 U/L (ref 0–44)
AST: 20 U/L (ref 15–41)
Albumin: 2 g/dL — ABNORMAL LOW (ref 3.5–5.0)
Alkaline Phosphatase: 87 U/L (ref 38–126)
Anion gap: 11 (ref 5–15)
BUN: 58 mg/dL — ABNORMAL HIGH (ref 6–20)
CO2: 20 mmol/L — ABNORMAL LOW (ref 22–32)
Calcium: 9 mg/dL (ref 8.9–10.3)
Chloride: 107 mmol/L (ref 98–111)
Creatinine, Ser: 1.35 mg/dL — ABNORMAL HIGH (ref 0.44–1.00)
GFR calc Af Amer: 50 mL/min — ABNORMAL LOW (ref >60)
GFR calc non Af Amer: 43 mL/min — ABNORMAL LOW (ref >60)
Glucose, Bld: 139 mg/dL — ABNORMAL HIGH (ref 70–99)
Potassium: 3.7 mmol/L (ref 3.5–5.1)
Sodium: 138 mmol/L (ref 135–145)
Total Bilirubin: 0.4 mg/dL (ref 0.3–1.2)
Total Protein: 5.7 g/dL — ABNORMAL LOW (ref 6.5–8.1)

## 2019-11-13 LAB — GLUCOSE, CAPILLARY
Glucose-Capillary: 122 mg/dL — ABNORMAL HIGH (ref 70–99)
Glucose-Capillary: 130 mg/dL — ABNORMAL HIGH (ref 70–99)
Glucose-Capillary: 150 mg/dL — ABNORMAL HIGH (ref 70–99)
Glucose-Capillary: 258 mg/dL — ABNORMAL HIGH (ref 70–99)

## 2019-11-13 LAB — CBC WITH DIFFERENTIAL/PLATELET
Abs Immature Granulocytes: 0.54 10*3/uL — ABNORMAL HIGH (ref 0.00–0.07)
Basophils Absolute: 0.1 10*3/uL (ref 0.0–0.1)
Basophils Relative: 1 %
Eosinophils Absolute: 0.1 10*3/uL (ref 0.0–0.5)
Eosinophils Relative: 1 %
HCT: 23.2 % — ABNORMAL LOW (ref 36.0–46.0)
Hemoglobin: 7.7 g/dL — ABNORMAL LOW (ref 12.0–15.0)
Immature Granulocytes: 6 %
Lymphocytes Relative: 15 %
Lymphs Abs: 1.3 10*3/uL (ref 0.7–4.0)
MCH: 28.1 pg (ref 26.0–34.0)
MCHC: 33.2 g/dL (ref 30.0–36.0)
MCV: 84.7 fL (ref 80.0–100.0)
Monocytes Absolute: 0.5 10*3/uL (ref 0.1–1.0)
Monocytes Relative: 6 %
Neutro Abs: 6.2 10*3/uL (ref 1.7–7.7)
Neutrophils Relative %: 71 %
Platelets: 345 10*3/uL (ref 150–400)
RBC: 2.74 MIL/uL — ABNORMAL LOW (ref 3.87–5.11)
RDW: 15.4 % (ref 11.5–15.5)
WBC: 8.7 10*3/uL (ref 4.0–10.5)
nRBC: 0.3 % — ABNORMAL HIGH (ref 0.0–0.2)

## 2019-11-13 LAB — BODY FLUID CULTURE
Culture: NO GROWTH
Gram Stain: NONE SEEN

## 2019-11-13 LAB — FUNGUS CULTURE WITH STAIN

## 2019-11-13 LAB — MAGNESIUM: Magnesium: 1.9 mg/dL (ref 1.7–2.4)

## 2019-11-13 LAB — FUNGAL ORGANISM REFLEX

## 2019-11-13 LAB — FUNGUS CULTURE RESULT

## 2019-11-13 MED ORDER — FENTANYL 12 MCG/HR TD PT72: 1.0000 | MEDICATED_PATCH | TRANSDERMAL | 0 refills | Status: DC

## 2019-11-13 MED ORDER — TRAMADOL HCL 50 MG PO TABS: 100.0000 mg | ORAL_TABLET | Freq: Four times a day (QID) | ORAL | 0 refills | Status: DC | PRN

## 2019-11-13 MED ORDER — RIVAROXABAN (XARELTO) VTE STARTER PACK (15 & 20 MG)
ORAL_TABLET | ORAL | 0 refills | Status: DC
Start: 2019-11-13 — End: 2019-12-04

## 2019-11-13 MED ORDER — RIVAROXABAN (XARELTO) VTE STARTER PACK (15 & 20 MG)
ORAL_TABLET | ORAL | 0 refills | Status: DC
Start: 2019-11-13 — End: 2019-11-13

## 2019-11-13 MED ORDER — FAMOTIDINE 20 MG PO TABS
20.0000 mg | ORAL_TABLET | Freq: Two times a day (BID) | ORAL | 1 refills | Status: AC
Start: 2019-11-13 — End: 2020-11-12

## 2019-11-13 NOTE — Progress Notes (Signed)
Physical Therapy Treatment Patient Details Name: Autumn Cook MRN: [id number] DOB: [birth year] Today's Date: 11/13/2019    History of Present Illness 59 yo female former smoker with metastatic breast cancer complicated by malignant pleural effusion.  She had pleurx catheter placed on 10/14/19.  Palliative medicine has been consulted, following for pain control.  Pt now with DVT  L UE,  on Heparin    PT Comments    Pt in bed on 2 lts with daughter at bedside.  Problem Solving: Slow processing;Decreased initiation;Difficulty sequencing;Requires verbal cues.  Assisted OOB to attempt amb required + 2 assist.  General bed mobility comments: increased time x 4 and repeat one step commands to complete.  Easily drifted off.  General transfer comment: Pt required + 2 side by side assist to stand from elevated bed with 4 attempts to allow pt to maximize performance.  Pt required 100% VC's on proper hand placement and 50% tactile cueing to complete. General Gait Details: VERY unsteady drunken, sluggish, shuffled gait with 100% VC's to encourage upright posture as well as increase gait distance.  Daughter assisted by following with recliner.  Therapist had to drive walker around bed and through doorway as well as maintain proper walker to self distance.  At end of 14 feet pt was able to share she needed to sit and c/o "short of breath".  Pt remianed on 2 lts with sats 92% and dyspnea 3/4.  Recliner brought to pt from behind.  This activity exhausted pt.  Positioned in recliner with multiple pillows for comfort and elevate L UE.   Daughter present and assisted during session by following with recliner.  Discussed home via PTAR vs  personal vehicle.  I feel pt will not be able to tolerate the whole process of getting into a car, car ride then getting into house.  Daughter agrees.  D/C to home via non emerg ambulance.    Follow Up Recommendations  Supervision/Assistance - 24 hour;Home health PT (has all equipment  including a hospital bed)     Equipment Recommendations       Recommendations for Other Services       Precautions / Restrictions Precautions Precautions: Fall Precaution Comments: monitor O2, pleurx catheter; AMS    Mobility  Bed Mobility Overal bed mobility: Needs Assistance Bed Mobility: Supine to Sit Rolling: Min assist   Supine to sit: Min assist     General bed mobility comments: increased time x 4 and repeat one step commands to complete.  Easily drifted off  Transfers Overall transfer level: Needs assistance Equipment used: Rolling walker (2 wheeled);None Transfers: Sit to/from [employer] to Stand: Min assist;+2 physical assistance;+2 safety/equipment Stand pivot transfers: Min assist;Mod assist;+2 physical assistance;+2 safety/equipment       General transfer comment: Pt required + 2 side by side assist to stand from elevated bed with 4 attempts to allow pt to maximize performance.  Pt required 100% VC's on proper hand placement and 50% tactile cueing to complete.  Ambulation/Gait Ambulation/Gait assistance: Mod assist;+2 physical assistance;+2 safety/equipment Gait Distance (Feet): 14 Feet Assistive device: Rolling walker (2 wheeled) Gait Pattern/deviations: Step-through pattern;Decreased stride length;Drifts right/left;Trunk flexed Gait velocity: decreased   General Gait Details: VERY unsteady drunken, sluggish, shuffled gait with 100% VC's to encourage upright posture as well as increase gait distance.  Daughter assisted by following with recliner.  Therapist had to drive walker around bed and through doorway as well as maintain proper walker to self distance.  At end of  14 feet pt was able to share she needed to sit and c/o "short of breath".  Pt remianed on 2 lts with sats 92% and dyspnea 3/4.  Recliner brought to pt from behind.  HIGH FALL RISK.   Stairs             Wheelchair Mobility    Modified Rankin (Stroke Patients  Only)       Balance                                            Cognition Arousal/Alertness: Awake/alert Behavior During Therapy: Flat affect Overall Cognitive Status: Impaired/Different from baseline Area of Impairment: Orientation;Memory;Safety/judgement;Awareness;Following commands;Attention;Problem solving                 Orientation Level: Person Current Attention Level: Divided   Following Commands: Follows one step commands inconsistently Safety/Judgement: Decreased awareness of safety;Decreased awareness of deficits   Problem Solving: Slow processing;Decreased initiation;Difficulty sequencing;Requires verbal cues General Comments: sleepy/groggy/delayed required repeat one step commands      Exercises      General Comments        Pertinent Vitals/Pain Pain Assessment: No/denies pain    Home Living                      Prior Function            PT Goals (current goals can now be found in the care plan section) Progress towards PT goals: Progressing toward goals    Frequency    Min 2X/week      PT Plan Current plan remains appropriate;Frequency needs to be updated    Co-evaluation              AM-PAC PT "6 Clicks" Mobility   Outcome Measure  Help needed turning from your back to your side while in a flat bed without using bedrails?: Total Help needed moving from lying on your back to sitting on the side of a flat bed without using bedrails?: Total Help needed moving to and from a bed to a chair (including a wheelchair)?: Total Help needed standing up from a chair using your arms (e.g., wheelchair or bedside chair)?: Total Help needed to walk in hospital room?: Total Help needed climbing 3-5 steps with a railing? : Total 6 Click Score: 6    End of Session Equipment Utilized During Treatment: Oxygen;Gait belt Activity Tolerance: Treatment limited secondary to medical complications (Comment) Patient left: in  chair;with call bell/phone within reach;with family/visitor present Nurse Communication: Mobility status PT Visit Diagnosis: Dizziness and giddiness (R42);Difficulty in walking, not elsewhere classified (R26.2)     Time: 2423-5361 PT Time Calculation (min) (ACUTE ONLY): 27 min  Charges:  $Gait Training: 8-22 mins $Therapeutic Activity: 8-22 mins                     Rica Koyanagi  PTA Acute  Rehabilitation Services Pager      [phone] Office      [phone]

## 2019-11-13 NOTE — Plan of Care (Signed)
Discussed with patient and daughter in the room plan of care for the evening, pain management and medications with some teach back displayed.

## 2019-11-13 NOTE — TOC Transition Note (Signed)
Transition of Care [facility]) - CM/SW Discharge Note   Patient Details  Name: KARN DERK MRN: [id number] Date of Birth: Apr 27, 1961  Transition of Care [facility]) CM/SW Contact:  Servando Snare, LCSW Phone Number: 11/13/2019, 12:56 PM   Clinical Narrative:   Patient to dc home by EMS. Patient has DME and HH as been arranged.   Servando Snare, [employer] [phone]     Final next level of care: [place] Barriers to Discharge: Continued Medical Work up   Patient Goals and CMS Choice Patient states their goals for this hospitalization and ongoing recovery are:: go home CMS Medicare.gov Compare Post Acute Care list provided to:: Patient Choice offered to / list presented to : Patient  Discharge Placement                       Discharge Plan and Services   Discharge Planning Services: CM Consult Post Acute Care Choice: Durable Medical Equipment, Home Health          DME Arranged: Oxygen, Walker rolling, Bedside commode, Lightweight manual wheelchair with seat cushion, Hospital bed         HH Arranged: RN, Nurse's Aide HH Agency: [facility] Date HH Agency Contacted: 10/29/19 Time [place]: 1229 Representative spoke with at [place]: [place] Interventions     Readmission Risk Interventions No flowsheet data found.

## 2019-11-13 NOTE — Discharge Instructions (Signed)
Suggestions For Increasing Calories And Protein Several small meals a day are easier to eat and digest than three large ones. Space meals about 2 to 3 hours apart to maximize comfort. Stop eating 2 to 3 hours before bed and sleep with your head elevated if gastric reflux (GERD) and heartburn are problems. Do not eat your favorite foods if you are feeling bad. Save them for when you feel good! Eat breakfast-type foods at any meal. Eggs are usually easy to eat and are great any time of the day. (The same goes for pancakes and waffles.) Eat when you feel hungry. Most people have the greatest appetite in the morning because they have not eaten all night. If this is the best meal for you, then pile on those calories and other nutrients in the morning and at lunch. Then you can have a smaller dinner without losing total calories for the day. Eat leftovers or nutritious snacks in the afternoon and early evening to round out your day. Try homemade or commercially prepared nutrition bars and puddings, as well as calorie- and protein-rich liquid nutritional supplements. Benefits of Physical Activity Talk to your doctor about physical activity. Light or moderate physical activity can help maintain muscle and promote an appetite. Walking in the neighborhood or the local mall is a great way to get up, get out, and get moving. If you are unsteady on your feet, try walking around the dining room table. Save Room for Calorie-Rich Food! Drink most fluids between meals instead of with meals. (It is fine to have a sip to help swallow food at meal time.) Fluids (which usually have fewer calories and nutrients than solid food) can take up valuable space in your stomach.  Foods Recommended High-Protein Foods Milk products Add cheese to toast, crackers, sandwiches, baked potatoes, vegetables, soups, noodles, meat, and fruit. Use reduced-fat (2%) or whole milk in place of water when cooking cereal and cream soups. Include  cream sauces on vegetables and pasta. Add powdered milk to cream soups and mashed potatoes.  Eggs Have hard-cooked eggs readily available in the refrigerator. Chop and add to salads, casseroles, soups, and vegetables. Make a quick egg salad. All eggs should be well cooked to avoid the risk of harmful bacteria.  Meats, poultry, and fish Add leftover cooked meats to soups, casseroles, salads, and omelets. Make dip by mixing diced, chopped, or shredded meat with sour cream and spices.  Beans, legumes, nuts, and seeds Sprinkle nuts and seeds on cereals, fruit, and desserts such as ice cream, pudding, and custard. Also serve nuts and seeds on vegetables, salads, and pasta. Spread peanut butter on toast, bread, English muffins, and fruit, or blend it in a milk shake. Add beans and peas to salads, soups, casseroles, and vegetable dishes.  High-Calorie Foods Butter, margarine, and  oils Melt butter or margarine over potatoes, rice, pasta, and cooked vegetables. Add melted butter or margarine into soups and casseroles and spread on bread for sandwiches before spreading sandwich spread or peanut butter. Saut or stir-fry vegetables, meats, chicken and fish such as shrimp/scallops in olive or canola oil. A variety of oils add calories and can be used to marinate meat, chicken, or fish.  Milk products Add whipping cream to desserts, pancakes, waffles, fruit, and hot chocolate, and fold it into soups and casseroles. Add sour cream to baked potatoes and vegetables.  Salad dressing Use regular (not low-fat or diet) mayonnaise and salad dressing on sandwiches and in dips with vegetables and fruit.     in dips with vegetables and fruit.   Sweets Add jelly and honey to bread and crackers. Add jam to fruit and ice cream and as a topping over cake.   Copyright 2020  [employer]. All rights reserved.   Information on my medicine - XARELTO (rivaroxaban)  WHY WAS XARELTO PRESCRIBED FOR YOU? Xarelto was prescribed to  treat blood clots that may have been found in the veins of your arms (deep vein thrombosis) and to reduce the risk of them occurring again.  What do you need to know about Xarelto? The starting dose is one 15 mg tablet taken TWICE daily with food for the FIRST 21 DAYS, then on 12/03/2019, the dose is changed to one 20 mg tablet taken ONCE A DAY with your evening meal.  DO NOT stop taking Xarelto without talking to the health care provider who prescribed the medication.  Refill your prescription for 20 mg tablets before you run out.  After discharge, you should have regular check-up appointments with your healthcare provider that is prescribing your Xarelto.  In the future your dose may need to be changed if your kidney function changes by a significant amount.  What do you do if you miss a dose? If you are taking Xarelto TWICE DAILY and you miss a dose, take it as soon as you remember. You may take two 15 mg tablets (total 30 mg) at the same time then resume your regularly scheduled 15 mg twice daily the next day.  If you are taking Xarelto ONCE DAILY and you miss a dose, take it as soon as you remember on the same day then continue your regularly scheduled once daily regimen the next day. Do not take two doses of Xarelto at the same time.   Important Safety Information Xarelto is a blood thinner medicine that can cause bleeding. You should call your healthcare provider right away if you experience any of the following: ? Bleeding from an injury or your nose that does not stop. ? Unusual colored urine (red or dark brown) or unusual colored stools (red or black). ? Unusual bruising for unknown reasons. ? A serious fall or if you hit your head (even if there is no bleeding).  Some medicines may interact with Xarelto and might increase your risk of bleeding while on Xarelto. To help avoid this, consult your healthcare provider or pharmacist prior to using any new prescription or  non-prescription medications, including herbals, vitamins, non-steroidal anti-inflammatory drugs (NSAIDs) and supplements.  This website has more information on Xarelto: [email or web address]

## 2019-11-13 NOTE — Progress Notes (Deleted)
@Patient  ID: RAIZA KIESEL, female    DOB: 1960-09-13, 59 y.o.   MRN: [id number]  No chief complaint on file.   Referring provider: Copland, Gay Filler, MD  HPI: 59 year old female, former smoker quit 1990 (5-pack-year history).  Past medical history significant for pneumonia right lower lobe, malignant pleural effusion, acute respiratory failure, malignant neoplasm right breast, DVT, type 2 diabetes.  Allergies  Allergen Reactions  . Penicillins Hives    Did it involve swelling of the face/tongue/throat, SOB, or low BP? Yes--syncope episode Did it involve sudden or severe rash/hives, skin peeling, or any reaction on the inside of your mouth or nose? No Did you need to seek medical attention at a hospital or doctor's office? Yes When did it last happen? 37-38 YEARS AGO If all above answers are "NO", may proceed with cephalosporin use.   . Metformin And Related Diarrhea  . Adhesive [Tape] Other (See Comments)    "severe bruising"  ALSO SKIN GLUE  . Asa [Aspirin] Other (See Comments)    GI upset   . Mango Flavor Hives  . Olive Tree Hives    Pt tolerates olive oil    Immunization History  Administered Date(s) Administered  . Tdap 11/15/2008    Past Medical History:  Diagnosis Date  . Allergy   . Arthritis   . Asthma   . Cancer [facility])    R breast cancer  . Diabetes mellitus without complication (HCC)    no meds now  . Family history of adverse reaction to anesthesia    "entire family takes a long time to wake up- N&V for several days"  . Family history of lung cancer   . Hypertension   . Migraine   . PONV (postoperative nausea and vomiting)     Tobacco History: Social History   Tobacco Use  Smoking Status Former Smoker  . Packs/day: 1.00  . Years: 5.00  . Pack years: 5.00  . Quit date: 05/08/1988  . Years since quitting: 31.5  Smokeless Tobacco Never Used   Counseling given: Not Answered   Facility-Administered Medications Prior to Visit    Medication Dose Route Frequency Provider Last Rate Last Admin  . 0.9 %  sodium chloride infusion   Intravenous Continuous Nita Sells, MD      . acetaminophen (TYLENOL) tablet 500 mg  500 mg Oral TID WC Magrinat, Virgie Dad, MD   500 mg at 11/12/19 1702  . antiseptic oral rinse (BIOTENE) solution 15 mL  15 mL Mouth Rinse PRN Mikey Bussing R, NP      . atenolol (TENORMIN) tablet 100 mg  100 mg Oral Daily Adhikari, Amrit, MD   100 mg at 11/12/19 1100  . feeding supplement (BOOST / RESOURCE BREEZE) liquid 1 Container  1 Container Oral BID BM Nita Sells, MD   1 Container at 11/12/19 2106  . feeding supplement (KATE FARMS STANDARD 1.4) liquid 325 mL  325 mL Oral Daily Dwyane Dee, MD   325 mL at 11/12/19 1439  . feeding supplement (PRO-STAT SUGAR FREE 64) liquid 30 mL  30 mL Oral BID Alma Friendly, MD   30 mL at 11/10/19 0909  . fentaNYL (DURAGESIC) 12 MCG/HR 1 patch  1 patch Transdermal Q72H Micheline Rough, MD   1 patch at 11/11/19 1623  . hydrocortisone cream 1 %   Topical BID Elodia Florence., MD   1 application at 33/83/29 2108  . insulin aspart (novoLOG) injection 0-15 Units  0-15 Units Subcutaneous Q4H  Allie Bossier, MD   2 Units at 11/13/19 0425  . ipratropium-albuterol (DUONEB) 0.5-2.5 (3) MG/3ML nebulizer solution 3 mL  3 mL Nebulization Q4H PRN Elodia Florence., MD   3 mL at 10/31/19 1510  . levalbuterol (XOPENEX) nebulizer solution 0.63 mg  0.63 mg Nebulization Q6H PRN Nita Sells, MD   0.63 mg at 11/07/19 0914  . LORazepam (ATIVAN) injection 0.25 mg  0.25 mg Intravenous Q6H PRN Elodia Florence., MD   0.25 mg at 11/10/19 1147  . MEDLINE mouth rinse  15 mL Mouth Rinse BID Matcha, Anupama, MD   15 mL at 11/12/19 2109  . metoprolol tartrate (LOPRESSOR) injection 5 mg  5 mg Intravenous Q8H PRN Allie Bossier, MD      . multivitamin with minerals tablet 1 tablet  1 tablet Oral Daily Nita Sells, MD   1 tablet at 11/12/19 1100  .  nystatin (MYCOSTATIN) 100000 UNIT/ML suspension 500,000 Units  5 mL Oral QID Alma Friendly, MD   500,000 Units at 11/12/19 2107  . polyethylene glycol (MIRALAX / GLYCOLAX) packet 17 g  17 g Oral Daily PRN Domingo Cocking, Gene, MD      . prochlorperazine (COMPAZINE) injection 5 mg  5 mg Intravenous Q6H PRN Elodia Florence., MD   5 mg at 11/05/19 0944   Or  . prochlorperazine (COMPAZINE) tablet 5 mg  5 mg Oral Q6H PRN Elodia Florence., MD      . promethazine [facility]) tablet 12.5 mg  12.5 mg Oral Q6H Elodia Florence., MD   12.5 mg at 11/13/19 0505  . Rivaroxaban (XARELTO) tablet 15 mg  15 mg Oral BID WC Luiz Ochoa, RPH   15 mg at 11/12/19 1702   Followed by  . [START ON 12/03/2019] rivaroxaban (XARELTO) tablet 20 mg  20 mg Oral Q supper Luiz Ochoa, [facility]      . senna (SENOKOT) tablet 17.2 mg  2 tablet Oral BID Micheline Rough, MD   17.2 mg at 11/12/19 2106  . sorbitol 70 % solution 30 mL  30 mL Oral Daily PRN Magrinat, Virgie Dad, MD      . sucralfate (CARAFATE) 1 GM/10ML suspension 1 g  1 g Oral TID WC & HS Magrinat, Virgie Dad, MD   1 g at 11/12/19 2107  . traMADol (ULTRAM) tablet 100 mg  100 mg Oral Q6H PRN Magrinat, Virgie Dad, MD   100 mg at 11/13/19 0505   Outpatient Medications Prior to Visit  Medication Sig Dispense Refill  . acetaminophen (TYLENOL) 500 MG tablet Take 1 tablet (500 mg total) by mouth 3 (three) times daily. 30 tablet 0  . albuterol (VENTOLIN HFA) 108 (90 Base) MCG/ACT inhaler Inhale 2 puffs into the lungs every 4 (four) hours as needed for wheezing or shortness of breath (cough, shortness of breath or wheezing.). 1 Inhaler 1  . atenolol (TENORMIN) 100 MG tablet TAKE 1 TABLET BY MOUTH EVERY DAY (Patient taking differently: Take 100 mg by mouth at bedtime. ) 30 tablet 5  . blood glucose meter kit and supplies KIT Dispense based on patient and insurance preference. Use up to four times daily as directed. (FOR ICD-9 250.00, 250.01). 1 each 0  . capecitabine  (XELODA) 500 MG tablet Take 3 tablets (1,500 mg total) by mouth 2 (two) times daily after a meal. Take daily for 14 days, then do not take for 7 days, then repeat. 84 tablet 6  . fluticasone-salmeterol (ADVAIR  HFA) 115-21 MCG/ACT inhaler Inhale 2 puffs into the lungs 2 (two) times daily. 1 Inhaler 12  . glucose blood (ACCU-CHEK ACTIVE STRIPS) test strip Use as instructed- check blood sugar up to 2x a day 100 each 12  . ibuprofen (ADVIL) 200 MG tablet Take 1 tablet (200 mg total) by mouth every 8 (eight) hours as needed for moderate pain. 30 tablet 0  . KLOR-CON M20 20 MEQ tablet Take 20 mEq by mouth at bedtime.     Marland Kitchen lisinopril-hydrochlorothiazide (ZESTORETIC) 20-12.5 MG tablet TAKE 2 TABLETS BY MOUTH EVERY DAY 60 tablet 5  . pantoprazole (PROTONIX) 40 MG tablet Take 1 tablet (40 mg total) by mouth daily. (Patient taking differently: Take 40 mg by mouth at bedtime. ) 30 tablet 0  . traMADol (ULTRAM) 50 MG tablet Take 1 tablet (50 mg total) by mouth every 6 (six) hours as needed for severe pain. 10 tablet 0  . Vitamin D, Ergocalciferol, (DRISDOL) 1.25 MG (50000 UNIT) CAPS capsule TAKE 1 CAPSULE BY MOUTH ONE TIME PER WEEK (Patient taking differently: Take 50,000 Units by mouth every Saturday. ) 4 capsule 5      Review of Systems  Review of Systems   Physical Exam  LMP 11/14/2013  Physical Exam   Lab Results:  CBC    Component Value Date/Time   WBC 8.7 11/13/2019 0623   RBC 2.74 (L) 11/13/2019 0623   HGB 7.7 (L) 11/13/2019 0623   HGB 12.9 07/24/2018 0825   HCT 23.2 (L) 11/13/2019 0623   PLT 345 11/13/2019 0623   PLT 342 07/24/2018 0825   MCV 84.7 11/13/2019 0623   MCV 86.8 12/06/2011 1042   MCH 28.1 11/13/2019 0623   MCHC 33.2 11/13/2019 0623   RDW 15.4 11/13/2019 0623   LYMPHSABS 1.3 11/13/2019 0623   MONOABS 0.5 11/13/2019 0623   EOSABS 0.1 11/13/2019 0623   BASOSABS 0.1 11/13/2019 0623    BMET    Component Value Date/Time   NA 138 11/13/2019 0623   K 3.7 11/13/2019  0623   CL 107 11/13/2019 0623   CO2 20 (L) 11/13/2019 0623   GLUCOSE 139 (H) 11/13/2019 0623   BUN 58 (H) 11/13/2019 0623   CREATININE 1.35 (H) 11/13/2019 0623   CREATININE 0.89 10/02/2019 1416   CREATININE 0.70 04/14/2015 1558   CALCIUM 9.0 11/13/2019 0623   GFRNONAA 43 (L) 11/13/2019 0623   GFRNONAA >60 10/02/2019 1416   GFRNONAA 87 03/27/2014 1130   GFRAA 50 (L) 11/13/2019 0623   GFRAA >60 10/02/2019 1416   GFRAA >89 03/27/2014 1130    BNP    Component Value Date/Time   BNP 10.9 03/07/2016 1410    ProBNP No results found for: PROBNP  Imaging: DG Chest 1 View  Result Date: 10/19/2019 CLINICAL DATA:  Pleural effusion EXAM: CHEST  1 VIEW COMPARISON:  10/17/2019 FINDINGS: Unchanged position of left IJ approach central venous catheter with tip in the lower SVC. Mild cardiomegaly. The left hemithorax remains completely opacified. IMPRESSION: Unchanged complete opacification of the left hemithorax. Electronically Signed   By: Ulyses Jarred M.D.   On: 10/19/2019 04:42   DG Chest 2 View  Result Date: 10/16/2019 CLINICAL DATA:  Chest pain, thoracentesis and chest tube insertion 2 days prior EXAM: CHEST - 2 VIEW COMPARISON:  Radiograph 10/14/2019 FINDINGS: Now complete opacification of the left hemithorax with nonvisualization of the previously seen aerated portion of the left upper lung likely reflecting progressive atelectatic collapse and accumulation of pleural fluid. There is some increasingly  consolidative opacity in the right lung base with air bronchograms. No visible pneumothorax or right effusion. Cardiomediastinal contours are largely obscured. The right heart border is grossly unchanged from prior. No acute osseous or soft tissue abnormality. Degenerative changes are present in the imaged spine and shoulders. IMPRESSION: 1. Complete opacification of the left hemithorax with nonvisualization of the previously seen aerated portion of the left upper lung, likely reflecting  progressive atelectatic collapse and accumulation of pleural fluid. 2. Increasingly consolidative opacity in the right lung base with air bronchograms. Could reflect acute infection or inflammation, possibly pneumonia or sequela of aspiration. Electronically Signed   By: Lovena Le M.D.   On: 10/16/2019 00:12   DG Chest 2 View  Result Date: 10/14/2019 CLINICAL DATA:  Left-sided pleural effusion EXAM: CHEST - 2 VIEW COMPARISON:  10/13/2019 FINDINGS: Left chest tube remains in place. The degree of aeration in the left lung has decreased in the interval from the prior exam. The degree of pleural fluid appears similar. Right lung remains clear. No bony abnormality is noted. IMPRESSION: Chest tube remains in place. The degree of aeration in the left lung has decreased in the interval from the prior exam. Electronically Signed   By: Inez Catalina M.D.   On: 10/14/2019 14:30   CT CHEST WO CONTRAST  Result Date: 11/09/2019 CLINICAL DATA:  Pleural effusion. Malignancy is suspected. Metastatic breast cancer. Severe LEFT chest pain. Swelling of the LEFT arm. EXAM: CT CHEST WITHOUT CONTRAST TECHNIQUE: Multidetector CT imaging of the chest was performed following the standard protocol without IV contrast. COMPARISON:  10/20/2019 FINDINGS: Cardiovascular: Heart and mediastinal structures are shifted towards the RIGHT. Heart is otherwise unremarkable. The noncontrast appearance of the aorta and pulmonary arteries is unremarkable. Mediastinum/Nodes: Mediastinal shift towards the RIGHT. Significant artifact limits evaluation of the thyroid gland. Enlarged LEFT axillary lymph nodes, measuring up to 11 millimeter short axis. No mediastinal adenopathy. Esophagus is unremarkable. Lungs/Pleura: Small Pleurx catheter is identified at the base of the LEFT pleural space. The entire LEFT lung is collapsed. Posteriorly, low-attenuation collection appears more discrete and spans the LEFT hemithorax. Collection measures 10.6 x 9.6 x 15.5  centimeters. Small amount of air is identified in the nondependent apical portion of this collection. Density measurements of this low-attenuation collection are indeterminate for simple fluid. A second small collection is identified in the anterior pleural space and also contains a small amount of air and measures 5.0 x 3.1 by 3.5 centimeters. Thickened, irregular pleura. The LEFT hemithorax is expanded, as before. Upper Abdomen: Low-attenuation lesion within the liver measures at least 3.8 centimeters. Musculoskeletal: No fracture is seen. Other: Irregular low-attenuation mass is identified in the posterior aspect of the RIGHT breast, 2.9 x 2.3 centimeters, likely site of prior lumpectomy. The LEFT breast is enlarged and not completely imaged. There is skin and trabecular thickening of the LEFT breast which is asymmetrically enlarged compared to the RIGHT. IMPRESSION: 1. Large low-attenuation collection involving the LEFT hemithorax, measuring 10.6 x 9.6 x 15.5 centimeters. Small amount of air in the nondependent apical portion of this collection. A second small collection is identified in the anterior pleural space and also contains a small amount of air. Persistent expansion of the LEFT hemithorax with mediastinal shift towards the RIGHT. 2. Enlarged LEFT axillary lymph nodes, suspicious for metastatic disease. 3. Irregular low-attenuation mass in the posterior aspect of the RIGHT breast, likely site of prior lumpectomy. 4. Enlarged LEFT breast with skin and trabecular thickening of the LEFT breast which is  asymmetrically enlarged compared to the RIGHT. 5. Stable low-attenuation lesion within the liver, measuring at least 3.8 centimeters. 6. Aortic Atherosclerosis (ICD10-I70.0). Electronically Signed   By: Nolon Nations M.D.   On: 11/09/2019 14:43   CT CHEST WO CONTRAST  Result Date: 10/20/2019 CLINICAL DATA:  Abnormal chest radiograph, pleural effusion. History of breast cancer. Increasing shortness of  breath and left-sided chest pain. No drainage from pleural catheter. EXAM: CT CHEST WITHOUT CONTRAST TECHNIQUE: Multidetector CT imaging of the chest was performed following the standard protocol without IV contrast. COMPARISON:  CT abdomen pelvis 09/30/2019.  MR abdomen 10/04/2019. FINDINGS: Cardiovascular: Left IJ central line terminates in the upper SVC. Heart is mildly enlarged. There may be a small pericardial effusion and/or pericardial thickening. Mediastinum/Nodes: Low-attenuation thyroid nodules measure up to 1.7 cm on the right No follow-up recommended unless clinically warranted (ref: J Am Coll Radiol. 2015 Feb;12(2): 143-50).Mediastinal lymph nodes measure up to 8 mm in the prevascular space. Hilar regions are difficult to evaluate without IV contrast. Left axillary lymph nodes and soft tissue stranding with the largest individual lymph node measuring 9 mm in short axis (2/30). No right axillary adenopathy. Prepericardiac/left juxtadiaphragmatic lymph nodes measure up to 11 mm (2/108). Lungs/Pleura: Pleural fluid occupies the entire left hemithorax. There is extensive associated irregular pleural thickening and left lung collapse. A chest tube terminates in the posterior left hemithorax. Heterogeneity at the base of the left hemithorax may represent a combination of atelectatic lung, complex fluid and/or pleural thickening (2/95). Difficult to exclude an underlying mass. Obstruction/narrowing of the left upper and left lower lobe bronchi. Rightward mass effect on the heart and mediastinum. Patchy ground-glass in the apical segment right upper lobe. 5 mm right lower lobe nodule (5/84). Upper Abdomen: Well-circumscribed low-attenuation lesion in the periphery of the liver measures at least 3.8 cm, incompletely imaged but similar to 09/30/2019. Additional vague areas of low-attenuation are seen in the liver and were better seen on 10/04/2019. Visualized portions of the liver, adrenal glands, kidneys,  spleen, stomach and bowel are otherwise unremarkable. Musculoskeletal: No definite worrisome lytic or sclerotic lesions. IMPRESSION: 1. Complex left pleural effusion occupies the entire left hemithorax, with extensive associated nodular pleural thickening, consistent with metastatic disease. Findings are progressive from 09/30/2019. Left chest tube is in place. Given the complexity of the findings, it is unlikely that much drainage will be seen. 2. Inferior right breast mass, as on 09/30/2019. Hepatic metastatic disease, better seen on 10/04/2019. 3. Borderline enlarged mediastinal and prepericardiac/left juxtadiaphragmatic lymph nodes. 4. Right lower lobe pulmonary nodule, indeterminate. 5. Patchy ground-glass in the apical segment right upper lobe may be infectious or inflammatory in etiology. 6. Small pericardial effusion/thickening. Electronically Signed   By: Lorin Picket M.D.   On: 10/20/2019 15:20   DG CHEST PORT 1 VIEW  Result Date: 11/10/2019 CLINICAL DATA:  Status post thoracentesis. EXAM: PORTABLE CHEST 1 VIEW COMPARISON:  Single-view of the chest 10/29/2019. CT chest 11/29/2019. FINDINGS: Complete whiteout of the left chest is worse than on the prior plain film where there was some aerated lung in the apex. No pneumothorax. Left pleural drainage catheter is in place. Right lung appears clear. The cardiac silhouette is largely obscured. IMPRESSION: Negative for pneumothorax after thoracentesis. Complete whiteout of the left chest consistent due to a pleural effusion and airspace disease. Pleural drainage catheter is in place in the left chest. Electronically Signed   By: Inge Rise M.D.   On: 11/10/2019 15:25   DG CHEST PORT 1 VIEW  Result Date: 10/29/2019 CLINICAL DATA:  Pleural effusion. EXAM: PORTABLE CHEST 1 VIEW COMPARISON:  Chest x-ray dated October 23, 2019. FINDINGS: Left-sided PleurX catheter again noted. Large left pleural effusion again noted. Small amount of aerated left upper  lobe at the apex. The remaining left lung is collapsed. The right lung is clear. No pneumothorax. Mediastinum remains deviated to the right. No acute osseous abnormality. IMPRESSION: 1. Unchanged large left pleural effusion with collapse of the majority of the left lung. Electronically Signed   By: Titus Dubin M.D.   On: 10/29/2019 09:14   DG CHEST PORT 1 VIEW  Result Date: 10/23/2019 CLINICAL DATA:  Pleural effusion EXAM: PORTABLE CHEST 1 VIEW COMPARISON:  10/22/2019 FINDINGS: Left pleural catheter in the left lung base is unchanged. Progression of large left effusion. Progression of left lower lobe collapse. Small amount of aerated left upper lobe. Right lung is clear.  No infiltrate or effusion on the right. No pneumothorax. IMPRESSION: PleurX catheter on the left unchanged. Progression of large left effusion and left lower lobe collapse. Electronically Signed   By: Franchot Gallo M.D.   On: 10/23/2019 09:11   DG Chest Port 1 View  Result Date: 10/22/2019 CLINICAL DATA:  Pleural effusion. EXAM: PORTABLE CHEST 1 VIEW COMPARISON:  10/19/2019 FINDINGS: Left IJ catheter tip projects over the SVC. The left pleural effusion has decreased in volume compared with the previous exam. There is improved aeration to the left upper lobe and left midlung. The large left upper lobe lung mass is again seen. IMPRESSION: 1. Decrease in volume of left pleural effusion and improved aeration to the left upper lobe and left midlung. 2. Stable left upper lobe lung mass. Electronically Signed   By: Kerby Moors M.D.   On: 10/22/2019 08:32   DG Chest Port 1 View  Result Date: 10/17/2019 CLINICAL DATA:  Pleural effusion EXAM: PORTABLE CHEST 1 VIEW COMPARISON:  Yesterday FINDINGS: White out of the left chest which has further progressed from yesterday. There is mass effect with rightward mediastinal displacement. Asymmetric right apical opacity from uncertain cause. IMPRESSION: Worsening opacification of the left chest  with white out and increased mediastinal mass effect, consistent with large volume pleural effusion. A left tunneled pleural catheter is in similar position. Electronically Signed   By: Monte Fantasia M.D.   On: 10/17/2019 05:02   DG Chest Port 1 View  Result Date: 10/16/2019 CLINICAL DATA:  History of breast cancer. Chest pain and shortness of breath. EXAM: PORTABLE CHEST 1 VIEW COMPARISON:  Earlier same day FINDINGS: Extensive artifact overlies the chest. Left internal jugular central line catheter in the SVC at the azygos level. Right chest remains clear. Chest tube in place at the inferior pleural space on the left. Large left effusion persists with near complete atelectasis of the left lung. IMPRESSION: New left internal jugular central line tip in the SVC at the azygos level. No other change. Left chest tube. Large left effusion with left lung atelectasis. Electronically Signed   By: Nelson Chimes M.D.   On: 10/16/2019 15:52   DG CHEST PORT 1 VIEW  Result Date: 10/16/2019 CLINICAL DATA:  Left pleural effusion EXAM: PORTABLE CHEST 1 VIEW COMPARISON:  October 16, 2019 study obtained earlier in the day FINDINGS: There remains extensive opacification on the left due to combination of left pleural effusion and airspace atelectasis/consolidation. The right lung is clear. Heart is prominent and stable. No new opacity evident. The visualized pulmonary vascularity is unremarkable. No adenopathy appreciable in areas  that can be assessed with respect to potential adenopathy. No bone lesions. No pneumothorax. IMPRESSION: Extensive opacity again noted on the left due to combination of effusion and atelectasis/consolidation. Right lung clear. Stable cardiac silhouette. No pneumothorax. Electronically Signed   By: Lowella Grip III M.D.   On: 10/16/2019 10:21   [place] RT UPPER EXTREM LTD SOFT TISSUE NON VASCULAR  Result Date: 11/13/2019 CLINICAL DATA:  Sensation of lump along proximal humerus region. EXAM: ULTRASOUND  right UPPER EXTREMITY LIMITED TECHNIQUE: Ultrasound examination of the upper extremity soft tissues was performed in the area of clinical concern. COMPARISON:  None. FINDINGS: No appreciable abnormality of the regional musculature or overlying subcutaneous tissues is identified. IMPRESSION: No significant abnormality identified. Electronically Signed   By: Van Clines M.D.   On: 11/13/2019 05:51   VAS [place] UPPER EXTREMITY VENOUS DUPLEX  Result Date: 11/10/2019 UPPER VENOUS STUDY  Indications: Pain, and Swelling Limitations: Poor ultrasound/tissue interface and line. Comparison Study: No prior studies. Performing Technologist: Oliver Hum RVT  Examination Guidelines: A complete evaluation includes B-mode imaging, spectral Doppler, color Doppler, and power Doppler as needed of all accessible portions of each vessel. Bilateral testing is considered an integral part of a complete examination. Limited examinations for reoccurring indications may be performed as noted.  Right Findings: +----------+------------+---------+-----------+----------+-------+ RIGHT     CompressiblePhasicitySpontaneousPropertiesSummary +----------+------------+---------+-----------+----------+-------+ Subclavian    Full       Yes       Yes                      +----------+------------+---------+-----------+----------+-------+  Left Findings: +----------+------------+---------+-----------+----------+-------+ LEFT      CompressiblePhasicitySpontaneousPropertiesSummary +----------+------------+---------+-----------+----------+-------+ IJV           None       No        No                Acute  +----------+------------+---------+-----------+----------+-------+ Subclavian    None       No        No                Acute  +----------+------------+---------+-----------+----------+-------+ Axillary      None       No        No                Acute   +----------+------------+---------+-----------+----------+-------+ Brachial      None       No        No                Acute  +----------+------------+---------+-----------+----------+-------+ Radial        Full                                          +----------+------------+---------+-----------+----------+-------+ Ulnar         Full                                          +----------+------------+---------+-----------+----------+-------+ Cephalic      Full                                          +----------+------------+---------+-----------+----------+-------+ Basilic  None                                          +----------+------------+---------+-----------+----------+-------+  Summary:  Right: No evidence of thrombosis in the subclavian.  Left: Findings consistent with acute deep vein thrombosis involving the left internal jugular vein, left subclavian vein, left axillary vein and left brachial veins. Findings consistent with acute superficial vein thrombosis involving the left basilic vein.  *See table(s) above for measurements and observations.  Diagnosing physician: Monica Martinez MD Electronically signed by Monica Martinez MD on 11/10/2019 at 10:55:45 AM.    Final    [place] EKG SITE RITE  Result Date: 10/16/2019 If Site Rite image not attached, placement could not be confirmed due to current cardiac rhythm.    Assessment & Plan:   No problem-specific Assessment & Plan notes found for this encounter.     Martyn Ehrich, NP 11/13/2019

## 2019-11-13 NOTE — Discharge Summary (Signed)
Physician Discharge Summary  Autumn Cook HKV:[id number] DOB: [birth year] DOA: 10/16/2019  PCP: Autumn Mclean, MD  Admit date: 10/16/2019 Discharge date: 11/13/2019  Admitted From: home Disposition:  home Discharging physician: Autumn Dee, MD  Recommendations for Outpatient Follow-up:  1. Follow up with oncology 2. Consider hospice if further decline  Patient discharged to home in Discharge Condition: fair CODE STATUS: Full Diet recommendation:  Diet Orders (From admission, onward)    Start     Ordered   11/13/19 0000  Diet general        11/13/19 1252   10/21/19 1454  Diet regular Room service appropriate? Yes; Fluid consistency: Thin  Diet effective now       Question Answer Comment  Room service appropriate? Yes   Fluid consistency: Thin      10/21/19 1454          Hospital Course: 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 She has had a pleurx cath that became occluded and was subsequently removed on 11/12/19.  During this hospitalization she has also been found to have a LUE DVT.  She was evaluated by pulmonology and considered not to be a candidate for pleurodesis 2/2 trapped lung.  She has been evaluated by oncology and palliative care during hospitalization with plans for returning home at discharge to continue palliative treatment of her Stage IV metastatic breast cancer.  She was prescribed Xarelto at discharge and will follow up with oncology to resume care.  Pain was controlled with Fentanyl patch and tramadol PRN. Scripts provided at discharge.    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; she is not a pleurodesis candidate - pleurx cath removed on 11/12/19 per pulm  Cancer associated pain - continue pain control   Acute respiratory failure with hypoxia ([place]) - given  severity of left lung on imaging she is likely to remain O2 dependent  Hypertension - continue atenolol   History of tobacco use -Ongoing cessation commended  Pneumonia of right lower lobe due to infectious organism - completed course of cefepime   Malignant neoplasm of overlapping sites of right breast in female, estrogen receptor positive ([place]) - Status post lumpectomy 12/2018 with neoadjuvant treatment as guided by Dr. 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 - CT performed 7/4 and on 7/5 had 600 cc of fluid drained as per pulmonology at the bedside - prognosis appears poor; patient's quality life is also poor; she has been seen by [facility] and PCCM - patient also appears to not understand gravity of her diagnosis and daughters seem to be in denial; her overall prognosis is poor - continue outpatient care with oncology if she wishes to do so   Metastases to the liver [facility]) - see breast cancer  DVT (deep venous thrombosis) ([place]) - extensive LUE DVT - continue xarelto   On day of discharge, patient was felt deemed stable for discharge. Patient/family member advised to call PCP or come back to ER if needed.   Discharge Diagnoses:   Principal Diagnosis: Malignant pleural effusion  Active Hospital Problems   Diagnosis Date Noted  . DVT (deep venous thrombosis) ([place]) 11/12/2019  . Cancer associated pain   . Palliative care by specialist   . Diabetes mellitus type 2, uncontrolled, with complications ([facility]) 56/43/3295  . Acute respiratory failure with  hypoxia ([place]) 10/16/2019  . Pneumonia of right lower lobe due to infectious organism 10/16/2019  . Metastases to the liver ([place]) 10/10/2019  . 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 Malignant neoplasm of overlapping sites of right breast in female, estrogen receptor positive ([place]) 07/23/2018  . Hypertension 09/11/2012  . History of tobacco use 09/11/2012    Resolved Hospital Problems  No resolved problems to  display.    Discharge Instructions    Diet general   Complete by: As directed    Increase activity slowly   Complete by: As directed      Allergies as of 11/13/2019      Reactions   Penicillins Hives   Did it involve swelling of the face/tongue/throat, SOB, or low BP? Yes--syncope episode Did it involve sudden or severe rash/hives, skin peeling, or any reaction on the inside of your mouth or nose? No Did you need to seek medical attention at a hospital or doctor's office? Yes When did it last happen? 37-38 YEARS AGO If all above answers are "NO", may proceed with cephalosporin use.   Metformin And Related Diarrhea   Adhesive [tape] Other (See Comments)   "severe bruising" ALSO SKIN GLUE   Asa [aspirin] Other (See Comments)   GI upset   Mango Flavor Hives   Olive Tree Hives   Pt tolerates olive oil      Medication List    STOP taking these medications   ibuprofen 200 MG tablet Commonly known as: ADVIL   pantoprazole 40 MG tablet Commonly known as: PROTONIX     TAKE these medications   acetaminophen 500 MG tablet Commonly known as: TYLENOL Take 1 tablet (500 mg total) by mouth 3 (three) times daily.   Advair HFA 115-21 MCG/ACT inhaler Generic drug: fluticasone-salmeterol Inhale 2 puffs into the lungs 2 (two) times daily.   albuterol 108 (90 Base) MCG/ACT inhaler Commonly known as: VENTOLIN HFA Inhale 2 puffs into the lungs every 4 (four) hours as needed for wheezing or shortness of breath (cough, shortness of breath or wheezing.).   atenolol 100 MG tablet Commonly known as: TENORMIN TAKE 1 TABLET BY MOUTH EVERY DAY What changed: when to take this   blood glucose meter kit and supplies Kit Dispense based on patient and insurance preference. Use up to four times daily as directed. (FOR ICD-9 250.00, 250.01).   capecitabine 500 MG tablet Commonly known as: XELODA Take 3 tablets (1,500 mg total) by mouth 2 (two) times daily after a meal. Take daily for 14  days, then do not take for 7 days, then repeat.   famotidine 20 MG tablet Commonly known as: PEPCID Take 1 tablet (20 mg total) by mouth 2 (two) times daily.   fentaNYL 12 MCG/HR Commonly known as: [place] 1 patch onto the skin every 3 (three) days. Start taking on: November 14, 2019   glucose blood test strip Commonly known as: ACCU-CHEK ACTIVE STRIPS Use as instructed- check blood sugar up to 2x a day   Klor-Con M20 20 MEQ tablet Generic drug: potassium chloride SA Take 20 mEq by mouth at bedtime.   lisinopril-hydrochlorothiazide 20-12.5 MG tablet Commonly known as: ZESTORETIC TAKE 2 TABLETS BY MOUTH EVERY DAY   Rivaroxaban Stater Pack (15 mg and 20 mg) Commonly known as: XARELTO STARTER PACK Follow package directions: Take one 62m tablet by mouth twice a day. On day 22, switch to one 232mtablet once a day. Take with food.   traMADol 50 MG tablet Commonly known  as: ULTRAM Take 2 tablets (100 mg total) by mouth every 6 (six) hours as needed for up to 5 days for moderate pain. What changed:   how much to take  reasons to take this   Vitamin D (Ergocalciferol) 1.25 MG (50000 UNIT) Caps capsule Commonly known as: DRISDOL TAKE 1 CAPSULE BY MOUTH ONE TIME PER WEEK What changed:   how much to take  how to take this  when to take this  additional instructions            Durable Medical Equipment  (From admission, onward)         Start     Ordered   11/03/19 1614  For home use only DME Hospital bed  Once       Question Answer Comment  Length of Need Lifetime   Patient has (list medical condition): R breast CA, L recurrent malignant pleural effusion s/p pleurx drain, DM, HTN   The above medical condition requires: Patient requires the ability to reposition frequently   Head must be elevated greater than: Other see comments   Bed type Semi-electric   Support Surface: Gel Overlay      11/03/19 1613   11/03/19 1423  For home use only DME lightweight  manual wheelchair with seat cushion  Once       Comments: Patient suffers from acute on chronic respiratory failure which impairs their ability to perform daily activities like ambulating in the home.  A walker will not resolve issue with performing activities of daily living. A wheelchair will allow patient to safely perform daily activities. Patient is not able to propel themselves in the home using a standard weight wheelchair due to weakness. Patient can self propel in the lightweight wheelchair. Length of need will be lifetime Accessories: elevating leg rests (ELRs), wheel locks, extensions and anti-tippers.   11/03/19 1424   10/29/19 0929  For home use only DME Walker rolling  Once       Question Answer Comment  Walker: With [place]   Patient needs a walker to treat with the following condition Unsteady gait      10/29/19 0946   10/29/19 0928  For home use only DME 3 n 1  Once        10/29/19 9604          Follow-up Information    Martyn Ehrich, NP Follow up on 11/13/2019.   Specialty: Pulmonary Disease Why: Follow up at 9:30 AM with lung doctors to monitor pleurx catheter. Contact information: [address] [place] [address] [phone]              Allergies  Allergen Reactions  . Penicillins Hives    Did it involve swelling of the face/tongue/throat, SOB, or low BP? Yes--syncope episode Did it involve sudden or severe rash/hives, skin peeling, or any reaction on the inside of your mouth or nose? No Did you need to seek medical attention at a hospital or doctor's office? Yes When did it last happen? 37-38 YEARS AGO If all above answers are "NO", may proceed with cephalosporin use.   . Metformin And Related Diarrhea  . Adhesive [Tape] Other (See Comments)    "severe bruising"  ALSO SKIN GLUE  . Asa [Aspirin] Other (See Comments)    GI upset   . Mango Flavor Hives  . Olive Tree Hives    Pt tolerates olive oil      Consultations: Oncology  Procedures/Studies: DG Chest 1 View  Result Date: 10/19/2019 CLINICAL DATA:  Pleural effusion EXAM: CHEST  1 VIEW COMPARISON:  10/17/2019 FINDINGS: Unchanged position of left IJ approach central venous catheter with tip in the lower SVC. Mild cardiomegaly. The left hemithorax remains completely opacified. IMPRESSION: Unchanged complete opacification of the left hemithorax. Electronically Signed   By: Ulyses Jarred M.D.   On: 10/19/2019 04:42   DG Chest 2 View  Result Date: 10/16/2019 CLINICAL DATA:  Chest pain, thoracentesis and chest tube insertion 2 days prior EXAM: CHEST - 2 VIEW COMPARISON:  Radiograph 10/14/2019 FINDINGS: Now complete opacification of the left hemithorax with nonvisualization of the previously seen aerated portion of the left upper lung likely reflecting progressive atelectatic collapse and accumulation of pleural fluid. There is some increasingly consolidative opacity in the right lung base with air bronchograms. No visible pneumothorax or right effusion. Cardiomediastinal contours are largely obscured. The right heart border is grossly unchanged from prior. No acute osseous or soft tissue abnormality. Degenerative changes are present in the imaged spine and shoulders. IMPRESSION: 1. Complete opacification of the left hemithorax with nonvisualization of the previously seen aerated portion of the left upper lung, likely reflecting progressive atelectatic collapse and accumulation of pleural fluid. 2. Increasingly consolidative opacity in the right lung base with air bronchograms. Could reflect acute infection or inflammation, possibly pneumonia or sequela of aspiration. Electronically Signed   By: Lovena Le M.D.   On: 10/16/2019 00:12   DG Chest 2 View  Result Date: 10/14/2019 CLINICAL DATA:  Left-sided pleural effusion EXAM: CHEST - 2 VIEW COMPARISON:  10/13/2019 FINDINGS: Left chest tube remains in place. The degree of aeration in the left  lung has decreased in the interval from the prior exam. The degree of pleural fluid appears similar. Right lung remains clear. No bony abnormality is noted. IMPRESSION: Chest tube remains in place. The degree of aeration in the left lung has decreased in the interval from the prior exam. Electronically Signed   By: Inez Catalina M.D.   On: 10/14/2019 14:30   CT CHEST WO CONTRAST  Result Date: 11/09/2019 CLINICAL DATA:  Pleural effusion. Malignancy is suspected. Metastatic breast cancer. Severe LEFT chest pain. Swelling of the LEFT arm. EXAM: CT CHEST WITHOUT CONTRAST TECHNIQUE: Multidetector CT imaging of the chest was performed following the standard protocol without IV contrast. COMPARISON:  10/20/2019 FINDINGS: Cardiovascular: Heart and mediastinal structures are shifted towards the RIGHT. Heart is otherwise unremarkable. The noncontrast appearance of the aorta and pulmonary arteries is unremarkable. Mediastinum/Nodes: Mediastinal shift towards the RIGHT. Significant artifact limits evaluation of the thyroid gland. Enlarged LEFT axillary lymph nodes, measuring up to 11 millimeter short axis. No mediastinal adenopathy. Esophagus is unremarkable. Lungs/Pleura: Small Pleurx catheter is identified at the base of the LEFT pleural space. The entire LEFT lung is collapsed. Posteriorly, low-attenuation collection appears more discrete and spans the LEFT hemithorax. Collection measures 10.6 x 9.6 x 15.5 centimeters. Small amount of air is identified in the nondependent apical portion of this collection. Density measurements of this low-attenuation collection are indeterminate for simple fluid. A second small collection is identified in the anterior pleural space and also contains a small amount of air and measures 5.0 x 3.1 by 3.5 centimeters. Thickened, irregular pleura. The LEFT hemithorax is expanded, as before. Upper Abdomen: Low-attenuation lesion within the liver measures at least 3.8 centimeters. Musculoskeletal:  No fracture is seen. Other: Irregular low-attenuation mass is identified in the posterior aspect of the RIGHT breast, 2.9 x  2.3 centimeters, likely site of prior lumpectomy. The LEFT breast is enlarged and not completely imaged. There is skin and trabecular thickening of the LEFT breast which is asymmetrically enlarged compared to the RIGHT. IMPRESSION: 1. Large low-attenuation collection involving the LEFT hemithorax, measuring 10.6 x 9.6 x 15.5 centimeters. Small amount of air in the nondependent apical portion of this collection. A second small collection is identified in the anterior pleural space and also contains a small amount of air. Persistent expansion of the LEFT hemithorax with mediastinal shift towards the RIGHT. 2. Enlarged LEFT axillary lymph nodes, suspicious for metastatic disease. 3. Irregular low-attenuation mass in the posterior aspect of the RIGHT breast, likely site of prior lumpectomy. 4. Enlarged LEFT breast with skin and trabecular thickening of the LEFT breast which is asymmetrically enlarged compared to the RIGHT. 5. Stable low-attenuation lesion within the liver, measuring at least 3.8 centimeters. 6. Aortic Atherosclerosis (ICD10-I70.0). Electronically Signed   By: Nolon Nations M.D.   On: 11/09/2019 14:43   CT CHEST WO CONTRAST  Result Date: 10/20/2019 CLINICAL DATA:  Abnormal chest radiograph, pleural effusion. History of breast cancer. Increasing shortness of breath and left-sided chest pain. No drainage from pleural catheter. EXAM: CT CHEST WITHOUT CONTRAST TECHNIQUE: Multidetector CT imaging of the chest was performed following the standard protocol without IV contrast. COMPARISON:  CT abdomen pelvis 09/30/2019.  MR abdomen 10/04/2019. FINDINGS: Cardiovascular: Left IJ central line terminates in the upper SVC. Heart is mildly enlarged. There may be a small pericardial effusion and/or pericardial thickening. Mediastinum/Nodes: Low-attenuation thyroid nodules measure up to 1.7  cm on the right No follow-up recommended unless clinically warranted (ref: J Am Coll Radiol. 2015 Feb;12(2): 143-50).Mediastinal lymph nodes measure up to 8 mm in the prevascular space. Hilar regions are difficult to evaluate without IV contrast. Left axillary lymph nodes and soft tissue stranding with the largest individual lymph node measuring 9 mm in short axis (2/30). No right axillary adenopathy. Prepericardiac/left juxtadiaphragmatic lymph nodes measure up to 11 mm (2/108). Lungs/Pleura: Pleural fluid occupies the entire left hemithorax. There is extensive associated irregular pleural thickening and left lung collapse. A chest tube terminates in the posterior left hemithorax. Heterogeneity at the base of the left hemithorax may represent a combination of atelectatic lung, complex fluid and/or pleural thickening (2/95). Difficult to exclude an underlying mass. Obstruction/narrowing of the left upper and left lower lobe bronchi. Rightward mass effect on the heart and mediastinum. Patchy ground-glass in the apical segment right upper lobe. 5 mm right lower lobe nodule (5/84). Upper Abdomen: Well-circumscribed low-attenuation lesion in the periphery of the liver measures at least 3.8 cm, incompletely imaged but similar to 09/30/2019. Additional vague areas of low-attenuation are seen in the liver and were better seen on 10/04/2019. Visualized portions of the liver, adrenal glands, kidneys, spleen, stomach and bowel are otherwise unremarkable. Musculoskeletal: No definite worrisome lytic or sclerotic lesions. IMPRESSION: 1. Complex left pleural effusion occupies the entire left hemithorax, with extensive associated nodular pleural thickening, consistent with metastatic disease. Findings are progressive from 09/30/2019. Left chest tube is in place. Given the complexity of the findings, it is unlikely that much drainage will be seen. 2. Inferior right breast mass, as on 09/30/2019. Hepatic metastatic disease, better  seen on 10/04/2019. 3. Borderline enlarged mediastinal and prepericardiac/left juxtadiaphragmatic lymph nodes. 4. Right lower lobe pulmonary nodule, indeterminate. 5. Patchy ground-glass in the apical segment right upper lobe may be infectious or inflammatory in etiology. 6. Small pericardial effusion/thickening. Electronically Signed   By: Rip Harbour  Blietz M.D.   On: 10/20/2019 15:20   DG CHEST PORT 1 VIEW  Result Date: 11/10/2019 CLINICAL DATA:  Status post thoracentesis. EXAM: PORTABLE CHEST 1 VIEW COMPARISON:  Single-view of the chest 10/29/2019. CT chest 11/29/2019. FINDINGS: Complete whiteout of the left chest is worse than on the prior plain film where there was some aerated lung in the apex. No pneumothorax. Left pleural drainage catheter is in place. Right lung appears clear. The cardiac silhouette is largely obscured. IMPRESSION: Negative for pneumothorax after thoracentesis. Complete whiteout of the left chest consistent due to a pleural effusion and airspace disease. Pleural drainage catheter is in place in the left chest. Electronically Signed   By: Inge Rise M.D.   On: 11/10/2019 15:25   DG CHEST PORT 1 VIEW  Result Date: 10/29/2019 CLINICAL DATA:  Pleural effusion. EXAM: PORTABLE CHEST 1 VIEW COMPARISON:  Chest x-ray dated October 23, 2019. FINDINGS: Left-sided PleurX catheter again noted. Large left pleural effusion again noted. Small amount of aerated left upper lobe at the apex. The remaining left lung is collapsed. The right lung is clear. No pneumothorax. Mediastinum remains deviated to the right. No acute osseous abnormality. IMPRESSION: 1. Unchanged large left pleural effusion with collapse of the majority of the left lung. Electronically Signed   By: Titus Dubin M.D.   On: 10/29/2019 09:14   DG CHEST PORT 1 VIEW  Result Date: 10/23/2019 CLINICAL DATA:  Pleural effusion EXAM: PORTABLE CHEST 1 VIEW COMPARISON:  10/22/2019 FINDINGS: Left pleural catheter in the left lung base is  unchanged. Progression of large left effusion. Progression of left lower lobe collapse. Small amount of aerated left upper lobe. Right lung is clear.  No infiltrate or effusion on the right. No pneumothorax. IMPRESSION: PleurX catheter on the left unchanged. Progression of large left effusion and left lower lobe collapse. Electronically Signed   By: Franchot Gallo M.D.   On: 10/23/2019 09:11   DG Chest Port 1 View  Result Date: 10/22/2019 CLINICAL DATA:  Pleural effusion. EXAM: PORTABLE CHEST 1 VIEW COMPARISON:  10/19/2019 FINDINGS: Left IJ catheter tip projects over the SVC. The left pleural effusion has decreased in volume compared with the previous exam. There is improved aeration to the left upper lobe and left midlung. The large left upper lobe lung mass is again seen. IMPRESSION: 1. Decrease in volume of left pleural effusion and improved aeration to the left upper lobe and left midlung. 2. Stable left upper lobe lung mass. Electronically Signed   By: Kerby Moors M.D.   On: 10/22/2019 08:32   DG Chest Port 1 View  Result Date: 10/17/2019 CLINICAL DATA:  Pleural effusion EXAM: PORTABLE CHEST 1 VIEW COMPARISON:  Yesterday FINDINGS: White out of the left chest which has further progressed from yesterday. There is mass effect with rightward mediastinal displacement. Asymmetric right apical opacity from uncertain cause. IMPRESSION: Worsening opacification of the left chest with white out and increased mediastinal mass effect, consistent with large volume pleural effusion. A left tunneled pleural catheter is in similar position. Electronically Signed   By: Monte Fantasia M.D.   On: 10/17/2019 05:02   DG Chest Port 1 View  Result Date: 10/16/2019 CLINICAL DATA:  History of breast cancer. Chest pain and shortness of breath. EXAM: PORTABLE CHEST 1 VIEW COMPARISON:  Earlier same day FINDINGS: Extensive artifact overlies the chest. Left internal jugular central line catheter in the SVC at the azygos  level. Right chest remains clear. Chest tube in place at the inferior  pleural space on the left. Large left effusion persists with near complete atelectasis of the left lung. IMPRESSION: New left internal jugular central line tip in the SVC at the azygos level. No other change. Left chest tube. Large left effusion with left lung atelectasis. Electronically Signed   By: Nelson Chimes M.D.   On: 10/16/2019 15:52   DG CHEST PORT 1 VIEW  Result Date: 10/16/2019 CLINICAL DATA:  Left pleural effusion EXAM: PORTABLE CHEST 1 VIEW COMPARISON:  October 16, 2019 study obtained earlier in the day FINDINGS: There remains extensive opacification on the left due to combination of left pleural effusion and airspace atelectasis/consolidation. The right lung is clear. Heart is prominent and stable. No new opacity evident. The visualized pulmonary vascularity is unremarkable. No adenopathy appreciable in areas that can be assessed with respect to potential adenopathy. No bone lesions. No pneumothorax. IMPRESSION: Extensive opacity again noted on the left due to combination of effusion and atelectasis/consolidation. Right lung clear. Stable cardiac silhouette. No pneumothorax. Electronically Signed   By: Lowella Grip III M.D.   On: 10/16/2019 10:21   [place] RT UPPER EXTREM LTD SOFT TISSUE NON VASCULAR  Result Date: 11/13/2019 CLINICAL DATA:  Sensation of lump along proximal humerus region. EXAM: ULTRASOUND right UPPER EXTREMITY LIMITED TECHNIQUE: Ultrasound examination of the upper extremity soft tissues was performed in the area of clinical concern. COMPARISON:  None. FINDINGS: No appreciable abnormality of the regional musculature or overlying subcutaneous tissues is identified. IMPRESSION: No significant abnormality identified. Electronically Signed   By: Van Clines M.D.   On: 11/13/2019 05:51   VAS [place] UPPER EXTREMITY VENOUS DUPLEX  Result Date: 11/10/2019 UPPER VENOUS STUDY  Indications: Pain, and Swelling  Limitations: Poor ultrasound/tissue interface and line. Comparison Study: No prior studies. Performing Technologist: Oliver Hum RVT  Examination Guidelines: A complete evaluation includes B-mode imaging, spectral Doppler, color Doppler, and power Doppler as needed of all accessible portions of each vessel. Bilateral testing is considered an integral part of a complete examination. Limited examinations for reoccurring indications may be performed as noted.  Right Findings: +----------+------------+---------+-----------+----------+-------+ RIGHT     CompressiblePhasicitySpontaneousPropertiesSummary +----------+------------+---------+-----------+----------+-------+ Subclavian    Full       Yes       Yes                      +----------+------------+---------+-----------+----------+-------+  Left Findings: +----------+------------+---------+-----------+----------+-------+ LEFT      CompressiblePhasicitySpontaneousPropertiesSummary +----------+------------+---------+-----------+----------+-------+ IJV           None       No        No                Acute  +----------+------------+---------+-----------+----------+-------+ Subclavian    None       No        No                Acute  +----------+------------+---------+-----------+----------+-------+ Axillary      None       No        No                Acute  +----------+------------+---------+-----------+----------+-------+ Brachial      None       No        No                Acute  +----------+------------+---------+-----------+----------+-------+ Radial        Full                                          +----------+------------+---------+-----------+----------+-------+  Ulnar         Full                                          +----------+------------+---------+-----------+----------+-------+ Cephalic      Full                                           +----------+------------+---------+-----------+----------+-------+ Basilic       None                                          +----------+------------+---------+-----------+----------+-------+  Summary:  Right: No evidence of thrombosis in the subclavian.  Left: Findings consistent with acute deep vein thrombosis involving the left internal jugular vein, left subclavian vein, left axillary vein and left brachial veins. Findings consistent with acute superficial vein thrombosis involving the left basilic vein.  *See table(s) above for measurements and observations.  Diagnosing physician: Monica Martinez MD Electronically signed by Monica Martinez MD on 11/10/2019 at 10:55:45 AM.    Final    [place] EKG SITE RITE  Result Date: 10/16/2019 If Site Rite image not attached, placement could not be confirmed due to current cardiac rhythm.   Discharge Exam: BP (!) 156/113 (BP Location: Left Leg)   Pulse 88   Temp 97.6 F (36.4 C) (Oral)   Resp 20   Ht 5' 6"  (1.676 m)   Wt 78.6 kg   LMP 11/14/2013   SpO2 100%   BMI 27.96 kg/m  General appearance: alert, cooperative and no distress Head: Normocephalic, without obvious abnormality Eyes: EOMI Lungs: Dull breath sounds on the left side Heart: regular rate and rhythm and S1, S2 normal Abdomen: normal findings: bowel sounds normal Extremities: No edema; small palpable hard 2-3cm lump on lateral right upper arm, minimally TTP Skin: mobility and turgor normal Neurologic: Grossly normal   The results of significant diagnostics from this hospitalization (including imaging, microbiology, ancillary and laboratory) are listed below for reference.     Microbiology: Recent Results (from the past 240 hour(s))  Body fluid culture (includes gram stain)     Status: None   Collection Time: 11/10/19 12:00 PM   Specimen: Pleural Fluid  Result Value Ref Range Status   Specimen Description   Final    PLEURAL Performed at [facility] [address], [place], [place] [address]    Special Requests   Final    NONE Performed at [facility], [place] [address], [place], [place] [address]    Gram Stain NO WBC SEEN NO ORGANISMS SEEN   Final   Culture   Final    NO GROWTH 3 DAYS Performed at [facility], [place] [address] [place], [place] [address]    Report Status 11/13/2019 FINAL  Final     Labs: BNP (last 3 results) No results for input(s): BNP in the last 8760 hours. Basic Metabolic Panel: Recent Labs  Lab 11/09/19 0556 11/10/19 1043 11/11/19 0511 11/12/19 0523 11/13/19 0623  NA 141 141 140 139 138  K 3.8 3.8 3.6 3.4* 3.7  CL 109 109 111 110 107  CO2 21* 21* 19* 21* 20*  GLUCOSE 153* 192*  138* 143* 139*  BUN 60* 61* 56* 56* 58*  CREATININE 1.23* 1.30* 1.25* 1.21* 1.35*  CALCIUM 9.2 9.1 8.8* 8.9 9.0  MG  --   --   --   --  1.9   Liver Function Tests: Recent Labs  Lab 11/11/19 0511 11/12/19 0523 11/13/19 0623  AST 21 20 20   ALT 11 11 12   ALKPHOS 88 82 87  BILITOT 0.5 0.4 0.4  PROT 5.4* 5.4* 5.7*  ALBUMIN 1.9* 1.9* 2.0*   No results for input(s): LIPASE, AMYLASE in the last 168 hours. No results for input(s): AMMONIA in the last 168 hours. CBC: Recent Labs  Lab 11/09/19 0556 11/10/19 1043 11/11/19 0511 11/12/19 0523 11/13/19 0623  WBC 11.4* 11.5* 10.7* 8.5 8.7  NEUTROABS 9.5* 10.0* 9.0* 7.0 6.2  HGB 8.3* 7.7* 7.4* 7.2* 7.7*  HCT 26.6* 23.3* 23.4* 22.4* 23.2*  MCV 89.0 87.6 88.0 87.5 84.7  PLT 403* 338 323 268 345   Cardiac Enzymes: No results for input(s): CKTOTAL, CKMB, CKMBINDEX, TROPONINI in the last 168 hours. BNP: Invalid input(s): POCBNP CBG: Recent Labs  Lab 11/12/19 2005 11/12/19 2359 11/13/19 0423 11/13/19 0728 11/13/19 1130  GLUCAP 258* 258* 130* 122* 150*   D-Dimer No results for input(s): DDIMER in the last 72 hours. Hgb A1c No results for input(s): HGBA1C in the last 72 hours. Lipid Profile No results for input(s): CHOL, HDL,  LDLCALC, TRIG, CHOLHDL, LDLDIRECT in the last 72 hours. Thyroid function studies No results for input(s): TSH, T4TOTAL, T3FREE, THYROIDAB in the last 72 hours.  Invalid input(s): FREET3 Anemia work up No results for input(s): VITAMINB12, FOLATE, FERRITIN, TIBC, IRON, RETICCTPCT in the last 72 hours. Urinalysis    Component Value Date/Time   COLORURINE YELLOW 10/31/2019 0537   APPEARANCEUR CLEAR 10/31/2019 0537   LABSPEC 1.023 10/31/2019 0537   PHURINE 5.0 10/31/2019 0537   GLUCOSEU NEGATIVE 10/31/2019 0537   HGBUR NEGATIVE 10/31/2019 0537   BILIRUBINUR NEGATIVE 10/31/2019 0537   BILIRUBINUR neg 11/24/2013 1547   KETONESUR 5 (A) 10/31/2019 0537   PROTEINUR NEGATIVE 10/31/2019 0537   UROBILINOGEN 0.2 11/24/2013 1547   UROBILINOGEN 0.2 11/10/2006 1153   NITRITE NEGATIVE 10/31/2019 0537   LEUKOCYTESUR NEGATIVE 10/31/2019 0537   Sepsis Labs Invalid input(s): PROCALCITONIN,  WBC,  LACTICIDVEN Microbiology Recent Results (from the past 240 hour(s))  Body fluid culture (includes gram stain)     Status: None   Collection Time: 11/10/19 12:00 PM   Specimen: Pleural Fluid  Result Value Ref Range Status   Specimen Description   Final    PLEURAL Performed at [facility], [place] [address] [place], [place] [address]    Special Requests   Final    NONE Performed at [facility], [place] [address], [place], [place] [address]    Gram Stain NO WBC SEEN NO ORGANISMS SEEN   Final   Culture   Final    NO GROWTH 3 DAYS Performed at [facility], [place] [address], [place],  [address]    Report Status 11/13/2019 FINAL  Final     Time coordinating discharge: Over 68 minutes    Autumn Dee, MD  [facility] 11/13/2019, 1:12 PM Pager: Secure chat  If 7PM-7AM, please contact night-coverage [email or web address] Password [email or web address]

## 2019-11-13 NOTE — Progress Notes (Signed)
Daily Progress Note   Patient Name: Autumn Cook       Date: 11/13/2019 DOB: Mar 21, 1961  Age: 59 y.o. MRN#: [id number] Attending Physician: Dwyane Dee, MD Primary Care Physician: Darreld Mclean, MD Admit Date: 10/16/2019  Reason for Consultation/Follow-up: Non pain symptom management and Pain control  Subjective: I met today with Autumn Cook and her daughter   Patient is awake alert in good spirits, awaiting discharge today, daughter wishes to discuss with TOC about discharge options.    Length of Stay: 28  Current Medications: Scheduled Meds:  . acetaminophen  500 mg Oral TID WC  . atenolol  100 mg Oral Daily  . feeding supplement  1 Container Oral BID BM  . feeding supplement (KATE FARMS STANDARD 1.4)  325 mL Oral Daily  . feeding supplement (PRO-STAT SUGAR FREE 64)  30 mL Oral BID  . fentaNYL  1 patch Transdermal Q72H  . hydrocortisone cream   Topical BID  . insulin aspart  0-15 Units Subcutaneous Q4H  . mouth rinse  15 mL Mouth Rinse BID  . multivitamin with minerals  1 tablet Oral Daily  . nystatin  5 mL Oral QID  . promethazine  12.5 mg Oral Q6H  . rivaroxaban  15 mg Oral BID WC   Followed by  . [START ON 12/03/2019] rivaroxaban  20 mg Oral Q supper  . senna  2 tablet Oral BID  . sucralfate  1 g Oral TID WC & HS   PRN Meds: antiseptic oral rinse, ipratropium-albuterol, levalbuterol, LORazepam, metoprolol tartrate, polyethylene glycol, prochlorperazine **OR** prochlorperazine, sorbitol, traMADol  Physical Exam     General: Awake,  in no acute distress.  Resp: Regular and non labored  Abdomen: non distended.  Ext: No significant edema Skin: Warm and dry        Vital Signs: BP (!) 156/113 (BP Location: Left Leg)   Pulse 88   Temp 97.6 F (36.4 C) (Oral)    Resp 20   Ht 5' 6"  (1.676 m)   Wt 78.6 kg   LMP 11/14/2013   SpO2 100%   BMI 27.96 kg/m  SpO2: SpO2: 100 % O2 Device: O2 Device: Nasal Cannula O2 Flow Rate: O2 Flow Rate (L/min): 2 L/min  Intake/output summary:   Intake/Output Summary (Last 24 hours) at 11/13/2019 1357 Last data  filed at 11/13/2019 0400 Gross per 24 hour  Intake 660 ml  Output --  Net 660 ml   LBM: Last BM Date: 11/05/19 Baseline Weight: Weight: 80.3 kg Most recent weight: Weight: 78.6 kg       Palliative Assessment/Data: Flowsheet Rows     Most Recent Value  Intake Tab  Referral Department Hospitalist  Unit at Time of Referral Other (Comment)  [urology/telementry]  Palliative Care Primary Diagnosis Cancer  Date Notified 10/20/19  Palliative Care Type New Palliative care  Reason for referral Pain, Non-pain Symptom  Date of Admission 10/16/19  Date first seen by Palliative Care 10/20/19  # of days Palliative referral response time 0 Day(s)  # of days IP prior to Palliative referral 4  Clinical Assessment  Psychosocial & Spiritual Assessment  Palliative Care Outcomes     Patient Active Problem List   Diagnosis Date Noted  . DVT (deep venous thrombosis) ([place]) 11/12/2019  . Cancer associated pain   . Therapeutic opioid induced constipation   . Generalized pain   . General weakness   . Palliative care by specialist   . Acute respiratory failure ([place]) 10/16/2019  . Diabetes mellitus type 2, uncontrolled, with complications ([place]) 82/64/1583  . Acute respiratory failure with hypoxia ([place]) 10/16/2019  . Pneumonia of right lower lobe due to infectious organism 10/16/2019  . Metastases to the liver ([place]) 10/10/2019  . 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 Pleural effusion on left 10/09/2019  . Type 2 diabetes mellitus with hyperglycemia ([place]) 10/09/2019  . Encounter for central line placement 08/13/2018  . Port-A-Cath in place 08/07/2018  . Family history of lung cancer   . Malignant neoplasm of  overlapping sites of right breast in female, estrogen receptor positive ([place]) 07/23/2018  . Achilles tendonitis, bilateral 06/11/2015  . Chest pain with moderate risk for cardiac etiology 10/13/2014  . Hypercholesterolemia 10/13/2014  . Idiopathic scoliosis 08/18/2014  . Hypertension 09/11/2012  . Overweight (BMI 25.0-29.9) 09/11/2012  . Migraine headache 09/11/2012  . Hx of thyroid nodule 09/11/2012  . History of tobacco use 09/11/2012    Palliative Care Assessment & Plan   Patient Profile: 59 year old female with metastatic breast cancer under the care of [name].  She is undergoing chemotherapy and radiation and is status post Pleurx catheter placement with cytology revealing malignant cells.  Currently admitted for discomfort and shortness of breath and pulmonary working to increase drainage from Pleurx catheter.  Plan on discharge she is for follow-up with [name] for further treatment with capecitabine.  Palliative following for pain management.  Recommendations/Plan: -FULL CODE -discussed about pain and non pain symptom management options at discharge, patient will follow up with med onc as outpatient.   D/C home with home health care today.   Goals of Care and Additional Recommendations:  Limitations on Scope of Treatment: Full Scope Treatment  Code Status:    Code Status Orders  (From admission, onward)         Start     Ordered   10/16/19 0803  Full code  Continuous        10/16/19 0808        Code Status History    Date Active Date Inactive Code Status Order ID Comments User Context   10/09/2019 2245 10/14/2019 2035 Full Code [id number]  Rise Patience, MD Inpatient   Advance Care Planning Activity       Prognosis:   Unable to determine  Discharge Planning:  Home with [place]  Care plan was discussed with  patient, daughter  Thank you for allowing the Palliative Medicine Team to assist in the care of this patient.    Total Time  15 Prolonged Time Billed No   Greater than 50%  of this time was spent counseling and coordinating care related to the above assessment and plan.  Loistine Chance, MD    Please contact Palliative Medicine Team phone at [phone] for questions and concerns.

## 2019-11-13 NOTE — Progress Notes (Signed)
Discharge instructions given. Patient and daughter verbalized understanding and all questions were answered.

## 2019-11-13 NOTE — Progress Notes (Signed)
Ryleeann Urquiza Arndt   DOB:[birth year]   MH#:[id number]   XQJ#:[id number]  Subjective:  Tayah is sitting up in recliner.  Daughter is at the bedside.  Pleurx catheter was removed yesterday.  She walked in the hallway with physical therapy earlier today.  Pain is controlled with fentanyl patch and tramadol as needed.  Daughter indicates that she may be discharged home later today.   Objective: African-American woman  Vitals:   11/13/19 0500 11/13/19 0600  BP:    Pulse:    Resp: 15 12  Temp:    SpO2:      Body mass index is 27.96 kg/m.  Intake/Output Summary (Last 24 hours) at 11/13/2019 1207 Last data filed at 11/13/2019 0400 Gross per 24 hour  Intake 660 ml  Output --  Net 660 ml        CBG (last 3)  Recent Labs    11/13/19 0423 11/13/19 0728 11/13/19 1130  GLUCAP 130* 122* 150*     Labs:  Lab Results  Component Value Date   WBC 8.7 11/13/2019   HGB 7.7 (L) 11/13/2019   HCT 23.2 (L) 11/13/2019   MCV 84.7 11/13/2019   PLT 345 11/13/2019   NEUTROABS 6.2 11/13/2019    @LASTCHEMISTRY @  Urine Studies No results for input(s): UHGB, CRYS in the last 72 hours.  Invalid input(s): UACOL, UAPR, USPG, UPH, UTP, UGL, UKET, UBIL, UNIT, UROB, ULEU, UEPI, UWBC, URBC, [place], [place], [place], [place]  Basic Metabolic Panel: Recent Labs  Lab 11/09/19 0556 11/09/19 0556 11/10/19 1043 11/10/19 1043 11/11/19 0511 11/11/19 0511 11/12/19 0523 11/13/19 0623  NA 141  --  141  --  140  --  139 138  K 3.8   < > 3.8   < > 3.6   < > 3.4* 3.7  CL 109  --  109  --  111  --  110 107  CO2 21*  --  21*  --  19*  --  21* 20*  GLUCOSE 153*  --  192*  --  138*  --  143* 139*  BUN 60*  --  61*  --  56*  --  56* 58*  CREATININE 1.23*  --  1.30*  --  1.25*  --  1.21* 1.35*  CALCIUM 9.2  --  9.1  --  8.8*  --  8.9 9.0  MG  --   --   --   --   --   --   --  1.9   < > = values in this interval not displayed.   GFR Estimated Creatinine Clearance: 48.6 mL/min (A) (by C-G formula based on SCr of 1.35  mg/dL (H)). Liver Function Tests: Recent Labs  Lab 11/11/19 0511 11/12/19 0523 11/13/19 0623  AST 21 20 20   ALT 11 11 12   ALKPHOS 88 82 87  BILITOT 0.5 0.4 0.4  PROT 5.4* 5.4* 5.7*  ALBUMIN 1.9* 1.9* 2.0*   No results for input(s): LIPASE, AMYLASE in the last 168 hours. No results for input(s): AMMONIA in the last 168 hours. Coagulation profile Recent Labs  Lab 11/11/19 0511  INR 1.7*    CBC: Recent Labs  Lab 11/09/19 0556 11/10/19 1043 11/11/19 0511 11/12/19 0523 11/13/19 0623  WBC 11.4* 11.5* 10.7* 8.5 8.7  NEUTROABS 9.5* 10.0* 9.0* 7.0 6.2  HGB 8.3* 7.7* 7.4* 7.2* 7.7*  HCT 26.6* 23.3* 23.4* 22.4* 23.2*  MCV 89.0 87.6 88.0 87.5 84.7  PLT 403* 338 323 268 345   Cardiac Enzymes: No  results for input(s): CKTOTAL, CKMB, CKMBINDEX, TROPONINI in the last 168 hours. BNP: Invalid input(s): POCBNP CBG: Recent Labs  Lab 11/12/19 2005 11/12/19 2359 11/13/19 0423 11/13/19 0728 11/13/19 1130  GLUCAP 258* 258* 130* 122* 150*   D-Dimer No results for input(s): DDIMER in the last 72 hours. Hgb A1c No results for input(s): HGBA1C in the last 72 hours. Lipid Profile No results for input(s): CHOL, HDL, LDLCALC, TRIG, CHOLHDL, LDLDIRECT in the last 72 hours. Thyroid function studies No results for input(s): TSH, T4TOTAL, T3FREE, THYROIDAB in the last 72 hours.  Invalid input(s): FREET3 Anemia work up No results for input(s): VITAMINB12, FOLATE, FERRITIN, TIBC, IRON, RETICCTPCT in the last 72 hours. Microbiology Recent Results (from the past 240 hour(s))  Body fluid culture (includes gram stain)     Status: None   Collection Time: 11/10/19 12:00 PM   Specimen: Pleural Fluid  Result Value Ref Range Status   Specimen Description   Final    PLEURAL Performed at [facility] [address], [place], [place] [address]    Special Requests   Final    NONE Performed at [facility], [place] [address], [place], [place] [address]     Gram Stain NO WBC SEEN NO ORGANISMS SEEN   Final   Culture   Final    NO GROWTH 3 DAYS Performed at [facility], [place] [address] [place],  [address]    Report Status 11/13/2019 FINAL  Final      Studies:  [place] RT UPPER EXTREM LTD SOFT TISSUE NON VASCULAR  Result Date: 11/13/2019 CLINICAL DATA:  Sensation of lump along proximal humerus region. EXAM: ULTRASOUND right UPPER EXTREMITY LIMITED TECHNIQUE: Ultrasound examination of the upper extremity soft tissues was performed in the area of clinical concern. COMPARISON:  None. FINDINGS: No appreciable abnormality of the regional musculature or overlying subcutaneous tissues is identified. IMPRESSION: No significant abnormality identified. Electronically Signed   By: Van Clines M.D.   On: 11/13/2019 05:51    Assessment: 59 y.o. [place], [place] woman status post right breast biopsy x2 on 07/10/2018 for multicentric invasive ductal carcinoma, clinically T1c N1, stage IIB, grade 3, functionally triple negative, with an MIB-1 of 70%             (a) right axillary lymph node biopsied at the same time was positive  (1) neoadjuvant chemotherapy consisting of doxorubicin and cyclophosphamide in dose dense fashion x4 started 08/07/2018, completed 09/16/2018, followed by weekly Abraxane and carboplatin x12 starting 08/07/2018, stopped after 2 cycles (last dose on 10/15/2018) due to peripheral neuropathy   (2) Right breast lumpectomy on 12/27/2018 shows a ypT1b ypN0 residual invasive ductal carcinoma, margins negative.             (a) 5 sentinel nodes were negative.              (b) Estrogen receptor 75% positive, weak, Progesterone receptor negative.  (3) adjuvant radiation  Radiation Treatment Dates: 02/18/2019 through 04/02/2019 Site Technique Total Dose (Gy) Dose per Fx (Gy) Completed Fx Beam Energies  Breast: Breast_Rt 3D 50.4/50.4 1.8 28/28 6X, 10X  Breast: Breast_Rt_axilla 3D 50.4/50.4 1.8 28/28 6X, 10X  Breast:  Breast_Rt_Bst 3D 20/20 2 10/10 6X, 10X    (4) genetics testing 08/06/2018 through the Breast + GYN Cancers Panel offered by [facility] found no deleterious mutations in ATM, BARD1, BRCA1, BRCA2, BRIP1, CDH1, CHEK2, DICER1, EPCAM, MLH1, MSH2, MSH6, NBN, NF1, PALB2, PMS2, PTEN, RAD50, RAD51C, RAD51D, SMARCA4, STK11, TP53.   (  5) anastrozole started 05/14/2018             (a) bone density 09/22/2019 shows a T score of -0.8 (normal).  METASTATIC DISEASE: June 2021 (6) CT renalstonestudy 09/30/2019 to evaluate left flank pain shows left pleural effusion and possible liver lesions (a)cytology from the left pleural effusion 10/03/2019 confirms metastatic disease (b)liver MRI 10/04/2019 shows multiple liver lesions (c) prognostic panel requested 10/10/2019 on cytology from 10/03/2019 fluid  (7) left effusion: left pleurx placement 10/13/2019  (a) Ct chest 11/09/2019 shows continuing left lung collapse, loculated pockets of fluid  (8) DVT left internal jugular, subclavian, axillary and brachial vv diagnosed by doppler 11/09/2019  (a) IV heparin started 11/09/2019  (9) capecitabine started 10/28/2019 at standard doses  (a) cycle 2 to resume 11/18/2019   PLAN:  Rebeca feels much better today.  Pain seems to be significantly improved with removal of Pleurx catheter.  She is on a fentanyl patch and oral tramadol.  She did require IV Toradol on 11/12/2019.  Could consider starting her on Celebrex if pain worsens.  She is still having some shortness of breath but not worsening.  She will be going home with oxygen.  She has been transitioned to Xarelto for her upper extremity DVT.  She should continue this at home.  From our standpoint, she may discharged home when otherwise medically stable.  Daughter indicates that she will be discharging to home with her daughters and home health following.  We will plan to resume capecitabine on 11/18/2019.  We will need to  arrange for outpatient follow-up for her.  Mikey Bussing, NP 11/13/2019  12:07 PM

## 2019-11-14 ENCOUNTER — Other Ambulatory Visit: Payer: Self-pay | Admitting: Internal Medicine

## 2019-11-14 DIAGNOSIS — G893 Neoplasm related pain (acute) (chronic): Secondary | ICD-10-CM

## 2019-11-14 MED ORDER — FENTANYL 12 MCG/HR TD PT72: 1.0000 | MEDICATED_PATCH | TRANSDERMAL | 0 refills | Status: DC

## 2019-11-14 MED ORDER — ONDANSETRON 4 MG PO TBDP
4.0000 mg | ORAL_TABLET | Freq: Three times a day (TID) | ORAL | 2 refills | Status: AC | PRN
Start: 2019-11-14 — End: ?

## 2019-11-14 MED ORDER — TRAMADOL HCL 50 MG PO TABS: 100.0000 mg | ORAL_TABLET | Freq: Four times a day (QID) | ORAL | 0 refills | Status: DC | PRN

## 2019-11-14 NOTE — Addendum Note (Signed)
Addended byDwyane Dee on: 11/14/2019 12:11 PM   Modules accepted: Orders

## 2019-11-15 ENCOUNTER — Encounter: Payer: Self-pay | Admitting: Oncology

## 2019-11-15 MED ORDER — ACCU-CHEK FASTCLIX LANCET KIT: 1.0000 | PACK | Freq: Two times a day (BID) | 1 refills | Status: AC

## 2019-11-15 MED ORDER — ACCU-CHEK FASTCLIX LANCETS MISC: 1.0000 | Freq: Two times a day (BID) | 3 refills | Status: AC

## 2019-11-17 ENCOUNTER — Other Ambulatory Visit: Payer: Self-pay | Admitting: Oncology

## 2019-11-17 NOTE — Progress Notes (Signed)
We had a call from [facility] wondering if she needed to be on more pain medicine.  I returned call several times to her cell phone which is what we had in the chart but she is not answering that.  I then called her daughter [name] and she is there with the patient and tells me that has been still staying a lot in bed but eating better, has more energy, and that her pain currently is well controlled.  We reviewed the fact that the less pain medicine she uses the better of course and that the point of the pain medicine is to help her be more active.  Currently she is on the 12.5 mg fentanyl patch and 100 mg of Ultram TRAM 4 times a day.  [name] tells me sometimes her mother complains of feeling a little bit short of breath after taking the tramadol and she is thinking going down to 50 and I think that would be a good move.  There is no problem with constipation at this point  They do feel ready to start the capecitabine tomorrow 11/18/2019.  I will be day 1 of cycle 2.  They are going to return to see me 12/02/2019.  That would be the end of the second cycle and we will check her lab work at that time

## 2019-11-18 ENCOUNTER — Telehealth: Payer: Self-pay | Admitting: Oncology

## 2019-11-18 NOTE — Telephone Encounter (Signed)
Scheduled appt per 7/12 sch msg - unable to reach pt .left message with appt date and time   

## 2019-11-20 ENCOUNTER — Encounter: Payer: Self-pay | Admitting: Family Medicine

## 2019-11-20 ENCOUNTER — Encounter: Payer: Self-pay | Admitting: Oncology

## 2019-11-20 ENCOUNTER — Telehealth: Payer: Self-pay | Admitting: Licensed Clinical Social Worker

## 2019-11-20 ENCOUNTER — Telehealth: Payer: Self-pay

## 2019-11-20 ENCOUNTER — Other Ambulatory Visit: Payer: Self-pay | Admitting: Oncology

## 2019-11-20 ENCOUNTER — Other Ambulatory Visit: Payer: Self-pay | Admitting: *Deleted

## 2019-11-20 DIAGNOSIS — Z170 Estrogen receptor positive status [ER+]: Secondary | ICD-10-CM

## 2019-11-20 DIAGNOSIS — J910 Malignant pleural effusion: Secondary | ICD-10-CM

## 2019-11-20 DIAGNOSIS — C50811 Malignant neoplasm of overlapping sites of right female breast: Secondary | ICD-10-CM

## 2019-11-20 DIAGNOSIS — C787 Secondary malignant neoplasm of liver and intrahepatic bile duct: Secondary | ICD-10-CM

## 2019-11-20 MED ORDER — VENLAFAXINE HCL ER 37.5 MG PO CP24
37.5000 mg | ORAL_CAPSULE | Freq: Every day | ORAL | 4 refills | Status: DC
Start: 2019-11-20 — End: 2019-12-10

## 2019-11-20 NOTE — Telephone Encounter (Signed)
[facility] Social Work  CSW received referral from [name] regarding concerns with home health for patient (see previous notes from [name], LPN Mickel Baas, RN Merleen Nicely).  CSW spoke with inpatient RN case manager, Dessa Phi, to confirm what had been completed prior to d/c. Pt had been approved from [facility] but, since pleurx catheter was removed, insurance is no longer approving services. Per Juliann Pulse, family had also been given ways to find private duty nursing, but would need to pay out of pocket.  CSW attempted to call patient/ daughter Autumn Cook, to offer support. Can try a referral to [facility] navigator to discuss private duty options through them and other agencies. However, services will still need to be paid out of pocket as insurance is not approving due to not meeting criteria.  No answer. CSW left VM with direct callback number.   Autumn Areola Billee Balcerzak, LCSW

## 2019-11-20 NOTE — Progress Notes (Signed)
[facility] not able to service pt d/t insurance. Contacted Corey at [facility] and they will be in touch with pt's daughter, [name] to set up PT. Attempted to call [name] to inform her, no answer LVM to return call.

## 2019-11-20 NOTE — Telephone Encounter (Signed)
Spoke with daughter, [name]. She voiced her frustration that they just found out this morning that [facility] would not be able to provide home health services due to insurance not approving. They are looking for in-home PT as well as general assistance with ADLs (eating, drinking, bathing). CSW acknowledged frustrations. Clarified that some resources may be available through private home care agencies but that expense would be out of pocket. Offered referral to [place] navigator to talk in more depth about services available and costs. [name] agreed to referral (done through [facility]) and took contact information for [facility] as well.   [name] is also concerned that her mom will not be strong enough to get to the appointment on 12/02/2019. She has a wheelchair but would need assistance. CSW described [facility] transportation options and will touch-base with daughter next week to determine if help is still needed. If it is, CSW will refer to [facility] transportation team.   Paulo Fruit, LCSW

## 2019-11-20 NOTE — Progress Notes (Signed)
Per MD request, referral placed for home health PT.  RN placed call to Santiago Glad, RN with [employer] to review orders and schedule pt to be seen by PT.
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
## 2019-11-21 ENCOUNTER — Other Ambulatory Visit: Payer: Self-pay

## 2019-11-21 ENCOUNTER — Telehealth: Payer: Self-pay

## 2019-11-21 ENCOUNTER — Encounter: Payer: Self-pay | Admitting: Oncology

## 2019-11-21 MED ORDER — PROMETHAZINE HCL 25 MG RE SUPP
25.0000 mg | Freq: Four times a day (QID) | RECTAL | 0 refills | Status: DC | PRN
Start: 2019-11-21 — End: 2019-11-21

## 2019-11-21 MED ORDER — PROMETHAZINE HCL 25 MG PO TABS
25.0000 mg | ORAL_TABLET | Freq: Four times a day (QID) | ORAL | 0 refills | Status: DC | PRN
Start: 2019-11-21 — End: 2019-12-04

## 2019-11-21 NOTE — Telephone Encounter (Signed)
Called and spoke with [name], Mrs. Fifer daughter about some complaints she had regarding her mother's care.  Home health has been an issue but Alvis Lemmings is supposed to come on Monday to start [place].  A nurse aide is not covered by their insurance and they have had many insurance hurdles.  Her phenergan was written as a suppository which will not work for her and we usually don't order suppositories for patients with cancer.  I changed it to oral so her mom could take it at home for her nausea and vomiting.  The Xeloda her mom takes does not taste good so sometimes she loses a dose that is not recoverable.  She was worried about refills and I advised her to talk to [name] about this at her next visit.    The daughter did mention that she has taken a month off of work (she works for herself so does not need FMLA) and she may need some financial assistance.  I will email Gwinda Maine, Loreta [address], and [place] about reaching out to her.   Overall, she did not seem as upset as she was earlier this week and I gave her my desk phone number [phone] if she has any other issues.  Gardiner Rhyme, RN

## 2019-11-24 ENCOUNTER — Other Ambulatory Visit: Payer: Self-pay | Admitting: *Deleted

## 2019-11-24 MED ORDER — ACETAMINOPHEN 500 MG PO TABS: 500.0000 mg | ORAL_TABLET | Freq: Three times a day (TID) | ORAL | 1 refills | Status: AC

## 2019-11-24 NOTE — Telephone Encounter (Signed)
Thank you for your help to this patient! Just a comment: we do use rectal anti-emetics if the oral route is unavailable in cancer patients so long as they are not neutropenic.  Thanks again your your help to this patient and her family!  GM

## 2019-11-25 ENCOUNTER — Telehealth: Payer: Self-pay | Admitting: Licensed Clinical Social Worker

## 2019-11-25 ENCOUNTER — Encounter: Payer: Self-pay | Admitting: Oncology

## 2019-11-25 ENCOUNTER — Encounter: Payer: Self-pay | Admitting: Licensed Clinical Social Worker

## 2019-11-25 ENCOUNTER — Telehealth: Payer: Self-pay | Admitting: *Deleted

## 2019-11-25 ENCOUNTER — Telehealth: Payer: Self-pay | Admitting: Family Medicine

## 2019-11-25 NOTE — Telephone Encounter (Signed)
[facility] Social Work  CSW left VM for patient's daughter. Attempted to f/u regarding transportation needs & potential referral. Will also look into potentially applying for disability and Autumn Cook, Autumn Cook.   Autumn Areola Shaniece Bussa, LCSW

## 2019-11-25 NOTE — Telephone Encounter (Signed)
This RN received VM late yesterday from physical therapist with [facility]- requesting services as well as social worker consult for additional assistance with community resources.  This RN returned call this AM and left a VM with above orders approved.  This RN also faxed per request 1/2 length bed rail per PT request.  This afternoon this RN received call from Dr Autumn Cook , the Chief Medical Officer with [facility] stating need for assistance with obtaining home nursing care for pt post discharge-he states he spoke with the patient's daughter yesterday regarding need.  This RN informed him of current in home care for PT and will follow up with [facility] for additional nursing needs and then update him on home care status.  Return call number for Dr Autumn Cook given as [phone] or [phone].  This RN called to [place] and spoke with Data processing manager - discussed home nursing needs including - medication teaching and management due to new oral chemotherapy start, Assess and implement wound care for Pleurx catheter removal site, change and decline in patient status. Request also for home health aide if possible due to care currently being provided by daughters at present who will need to be returning to work soon.  Autumn Cook states PT will be going out, pt is currently scheduled for Social Worker visit on 7/23- nursing will be requested but due to staffing nurse may not be able to go out until early next week.- though she understands severity of current need and if nursing available will schedule asap.  This RN's direct number given to [facility] for communication and care for the patient.  This RN then called to Autumn Cook- the patient's daughter and obtained a verified VM. Message left regarding above and request for a return call for communication of needs.    Of note verified insurance coverage.

## 2019-11-25 NOTE — Telephone Encounter (Signed)
Verbal order request PT for  2 week  4 1 week  1.. And also a request for a Education officer, museum .Marland Kitchen Please advise  Okay to leave message .  [phone]

## 2019-11-25 NOTE — Progress Notes (Signed)
[place] CSW Progress Note  Clinical Education officer, museum contacted caregiver by phone [name]) to follow-up on resources. [name] agreed to have CSW refer patient for transportation services through [facility]. Referral submitted today.  CSW also explored other options for assistance, including [place] (e-mailed information to [email or web address]) & [employer] (printed for 7/27 appt) foundations. Discussed that it may be worth trying to apply for disability as well if patient is open to it. Daughter will discuss with patient & either call this CSW back or inform during appointment on 7/27.    Autumn Cook , LCSW

## 2019-11-25 NOTE — Progress Notes (Signed)
Spoke w/ pt's daughter [name] regarding possible financial assistance.  I informed her since her mother is not in treatment and since there aren't any foundations offering funding for her Dx there isn't anything I can assist with at this time.  I advised her to call customer service with the hospital because they can set her up on a payment plan or the [employer] One card and if her mother's balance is $5,000 or greater she could apply for the [employer] program.  She verbalized understanding.

## 2019-11-25 NOTE — Telephone Encounter (Signed)
Verbal order given to [facility].

## 2019-11-26 ENCOUNTER — Inpatient Hospital Stay: Payer: 59 | Admitting: Internal Medicine

## 2019-11-27 ENCOUNTER — Encounter: Payer: Self-pay | Admitting: Family Medicine

## 2019-11-28 ENCOUNTER — Telehealth: Payer: Self-pay | Admitting: Family Medicine

## 2019-11-28 ENCOUNTER — Encounter: Payer: Self-pay | Admitting: Oncology

## 2019-11-28 ENCOUNTER — Encounter: Payer: Self-pay | Admitting: Licensed Clinical Social Worker

## 2019-11-28 NOTE — Telephone Encounter (Signed)
°   Autumn Cook DOB: [birth year] MRN: [id number]   RIDER WAIVER AND RELEASE OF LIABILITY  For purposes of improving physical access to our facilities, [place]  is pleased to partner with third parties to provide [place] patients or other authorized individuals the option of convenient, on-demand ground transportation services (the [employer]) through use of the technology service that enables users to request on-demand ground transportation from independent third-party providers.  By opting to use and accept these [employer], I, the undersigned, hereby agree on behalf of myself, and on behalf of any minor child using the [employer] for whom I am the parent or legal guardian, as follows:  1. Government social research officer provided to me are provided by independent third-party transportation providers who are not [employer] or employees and who are unaffiliated with [employer]. 2. [place] is neither a transportation carrier nor a common or public carrier. 3. [place] has no control over the quality or safety of the transportation that occurs as a result of the [employer]. 4. [place] cannot guarantee that any third-party transportation provider will complete any arranged transportation service. 5. [place] makes no representation, warranty, or guarantee regarding the reliability, timeliness, quality, safety, suitability, or availability of any of the Transport Services or that they will be error free. 6. I fully understand that traveling by vehicle involves risks and dangers of serious bodily injury, including permanent disability, paralysis, and death. I agree, on behalf of myself and on behalf of any minor child using the Transport Services for whom I am the parent or legal guardian, that the entire risk arising out of my use of the [employer] remains solely with me, to the maximum extent permitted under applicable law. 7. The [employer] are provided as is and as available. [place] disclaims all representations and warranties, express, implied or statutory, not expressly set out in these terms, including the implied warranties of merchantability and fitness for a particular purpose. 8. I hereby waive and release [place], its agents, employees, officers, directors, representatives, insurers, attorneys, assigns, successors, subsidiaries, and affiliates from any and all past, present, or future claims, demands, liabilities, actions, causes of action, or suits of any kind directly or indirectly arising from acceptance and use of the [employer]. 9. I further waive and release  and its affiliates from all present and future liability and responsibility for any injury or death to persons or damages to property caused by or related to the use of the [employer]. 10. I have read this Waiver and Release of Liability, and I understand the terms used in it and their legal significance. This Waiver is freely and voluntarily given with the understanding that my right (as well as the right of any minor child for whom I am the parent or legal guardian using the [employer]) to legal recourse against  in connection with the [employer] is knowingly surrendered in return for use of these services.   I attest that I read the consent document to [name], gave Ms. Luce the opportunity to ask questions and answered the questions asked (if any). I affirm that Autumn Cook then provided consent for she's participation in this program.     [name]

## 2019-11-28 NOTE — Progress Notes (Signed)
[place] CSW Progress Note  [place] representative spoke with patient's daughter who expressed that patient is interested in applying for disability. CSW confirmed information for referral and sent to [facility]. Will have patient sign authorization form at appointment next week.    Edwinna Areola Durrel Mcnee , LCSW

## 2019-11-30 ENCOUNTER — Encounter: Payer: Self-pay | Admitting: Oncology

## 2019-12-01 ENCOUNTER — Other Ambulatory Visit: Payer: Self-pay | Admitting: Oncology

## 2019-12-01 NOTE — Addendum Note (Signed)
Addended by: Lamar Blinks C on: 12/01/2019 01:20 PM   Modules accepted: Orders

## 2019-12-02 ENCOUNTER — Other Ambulatory Visit: Payer: Self-pay | Admitting: *Deleted

## 2019-12-02 ENCOUNTER — Inpatient Hospital Stay: Payer: 59 | Admitting: Licensed Clinical Social Worker

## 2019-12-02 ENCOUNTER — Other Ambulatory Visit: Payer: Self-pay

## 2019-12-02 ENCOUNTER — Encounter: Payer: Self-pay | Admitting: Oncology

## 2019-12-02 ENCOUNTER — Inpatient Hospital Stay: Payer: 59 | Attending: Oncology | Admitting: Oncology

## 2019-12-02 ENCOUNTER — Inpatient Hospital Stay: Payer: 59

## 2019-12-02 VITALS — BP 104/69 | HR 80 | Temp 98.3°F | Resp 18 | Ht 66.0 in | Wt 160.1 lb

## 2019-12-02 DIAGNOSIS — E1165 Type 2 diabetes mellitus with hyperglycemia: Secondary | ICD-10-CM | POA: Diagnosis not present

## 2019-12-02 DIAGNOSIS — Z86718 Personal history of other venous thrombosis and embolism: Secondary | ICD-10-CM | POA: Diagnosis not present

## 2019-12-02 DIAGNOSIS — Z7901 Long term (current) use of anticoagulants: Secondary | ICD-10-CM | POA: Insufficient documentation

## 2019-12-02 DIAGNOSIS — D6481 Anemia due to antineoplastic chemotherapy: Secondary | ICD-10-CM

## 2019-12-02 DIAGNOSIS — E1142 Type 2 diabetes mellitus with diabetic polyneuropathy: Secondary | ICD-10-CM | POA: Diagnosis not present

## 2019-12-02 DIAGNOSIS — Z171 Estrogen receptor negative status [ER-]: Secondary | ICD-10-CM | POA: Diagnosis not present

## 2019-12-02 DIAGNOSIS — C50811 Malignant neoplasm of overlapping sites of right female breast: Secondary | ICD-10-CM | POA: Diagnosis not present

## 2019-12-02 DIAGNOSIS — Z170 Estrogen receptor positive status [ER+]: Secondary | ICD-10-CM

## 2019-12-02 DIAGNOSIS — I10 Essential (primary) hypertension: Secondary | ICD-10-CM | POA: Insufficient documentation

## 2019-12-02 DIAGNOSIS — I700 Atherosclerosis of aorta: Secondary | ICD-10-CM | POA: Diagnosis not present

## 2019-12-02 DIAGNOSIS — R590 Localized enlarged lymph nodes: Secondary | ICD-10-CM | POA: Diagnosis not present

## 2019-12-02 DIAGNOSIS — Z87891 Personal history of nicotine dependence: Secondary | ICD-10-CM | POA: Diagnosis not present

## 2019-12-02 DIAGNOSIS — Z79899 Other long term (current) drug therapy: Secondary | ICD-10-CM | POA: Diagnosis not present

## 2019-12-02 DIAGNOSIS — Z801 Family history of malignant neoplasm of trachea, bronchus and lung: Secondary | ICD-10-CM | POA: Insufficient documentation

## 2019-12-02 DIAGNOSIS — J910 Malignant pleural effusion: Secondary | ICD-10-CM

## 2019-12-02 DIAGNOSIS — C787 Secondary malignant neoplasm of liver and intrahepatic bile duct: Secondary | ICD-10-CM

## 2019-12-02 DIAGNOSIS — Z79811 Long term (current) use of aromatase inhibitors: Secondary | ICD-10-CM | POA: Insufficient documentation

## 2019-12-02 DIAGNOSIS — IMO0001 Reserved for inherently not codable concepts without codable children: Secondary | ICD-10-CM

## 2019-12-02 DIAGNOSIS — C50911 Malignant neoplasm of unspecified site of right female breast: Secondary | ICD-10-CM | POA: Insufficient documentation

## 2019-12-02 DIAGNOSIS — R1012 Left upper quadrant pain: Secondary | ICD-10-CM

## 2019-12-02 LAB — CBC WITH DIFFERENTIAL/PLATELET
Abs Immature Granulocytes: 0.28 10*3/uL — ABNORMAL HIGH (ref 0.00–0.07)
Basophils Absolute: 0 10*3/uL (ref 0.0–0.1)
Basophils Relative: 0 %
Eosinophils Absolute: 0.1 10*3/uL (ref 0.0–0.5)
Eosinophils Relative: 1 %
HCT: 20.7 % — ABNORMAL LOW (ref 36.0–46.0)
Hemoglobin: 6.8 g/dL — CL (ref 12.0–15.0)
Immature Granulocytes: 4 %
Lymphocytes Relative: 12 %
Lymphs Abs: 0.8 10*3/uL (ref 0.7–4.0)
MCH: 28.2 pg (ref 26.0–34.0)
MCHC: 32.9 g/dL (ref 30.0–36.0)
MCV: 85.9 fL (ref 80.0–100.0)
Monocytes Absolute: 0.7 10*3/uL (ref 0.1–1.0)
Monocytes Relative: 10 %
Neutro Abs: 4.8 10*3/uL (ref 1.7–7.7)
Neutrophils Relative %: 73 %
Platelets: 467 10*3/uL — ABNORMAL HIGH (ref 150–400)
RBC: 2.41 MIL/uL — ABNORMAL LOW (ref 3.87–5.11)
RDW: 18 % — ABNORMAL HIGH (ref 11.5–15.5)
WBC: 6.6 10*3/uL (ref 4.0–10.5)
nRBC: 0 % (ref 0.0–0.2)

## 2019-12-02 LAB — COMPREHENSIVE METABOLIC PANEL
ALT: 11 U/L (ref 0–44)
AST: 30 U/L (ref 15–41)
Albumin: 2.5 g/dL — ABNORMAL LOW (ref 3.5–5.0)
Alkaline Phosphatase: 81 U/L (ref 38–126)
Anion gap: 12 (ref 5–15)
BUN: 19 mg/dL (ref 6–20)
CO2: 28 mmol/L (ref 22–32)
Calcium: 10.3 mg/dL (ref 8.9–10.3)
Chloride: 99 mmol/L (ref 98–111)
Creatinine, Ser: 0.92 mg/dL (ref 0.44–1.00)
GFR calc Af Amer: >60 mL/min (ref >60)
GFR calc non Af Amer: >60 mL/min (ref >60)
Glucose, Bld: 136 mg/dL — ABNORMAL HIGH (ref 70–99)
Potassium: 3.8 mmol/L (ref 3.5–5.1)
Sodium: 139 mmol/L (ref 135–145)
Total Bilirubin: 0.4 mg/dL (ref 0.3–1.2)
Total Protein: 6.8 g/dL (ref 6.5–8.1)

## 2019-12-02 NOTE — Progress Notes (Signed)
[facility]  [phone] [phone]    ID: Autumn Cook DOB: 05/03/1961  MR#: [id number]  CSN#:[id number]  Patient Care Team: Cook, Autumn C, MD as PCP - General (Family Medicine) Cook, Richard, MD as Consulting Physician (Obstetrics and Gynecology) Cook, Autumn C, RN as Oncology Nurse Navigator Cook, Autumn N, RN as Oncology Nurse Navigator Cook, David, MD as Consulting Physician (General Surgery) Cook, Autumn C, MD as Consulting Physician (Oncology) Cook, James, MD as Consulting Physician (Radiation Oncology)   CHIEF COMPLAINT:  triple negative breast cancer  CURRENT TREATMENT: capecitabine; rivaroxaban  INTERVAL HISTORY: Autumn Cook returns today for follow up of her functionally triple negative breast cancer accompanied by her daughter Autumn Cook.  She had a very long hospitalization ending 11/13/2019, during which she underwent several thoracenteses and had the possibility of chest tube placement discussed repeatedly by pulmonary.  In the end it was felt that there was little hope the left lung would reexpand.  The patient Pleurx catheter was removed.She was discharged home with HOMO2 and home PTx  Her anastrozole was stopped and she was started on capectiabine on 10/19/2019.  So far she has tolerated that well. In particular she has had no diarrhea, no nausea or vomiting, no mouth sores and no palmar plantar erythrodysesthesia. She just completed her second cycle and is starting her off week.  Her most recent bone density screening showed a T-score of -0.8, which is considered normal.    REVIEW OF SYSTEMS Autumn Cook tells me with a walker, assistance getting up from a chair and the therapist she can walk 10 feet, which is considerably better than what she was doing in the hospital (then in bed 24/7). She has no appetite and taste is changed. She has no cough or phlegm; she had one episode of minimal hemoptysis some time ago, not recurrent,  and has no over bleeding despite the anticoagulant. She denies swelling or fever. A detailed ROS today was otherwise noncontributory   HISTORY OF CURRENT ILLNESS: From the original intake note:  Autumn Cook had routine screening mammography on 07/01/2018 showing a possible abnormality in the right breast; she notes that did feel some pain and a lump in her breast leading up to the routine screening. She underwent right breast ultrasonography at [facility] on 07/05/2018 showing: A 1.4 x 1.2 x 1.4 cm oval hypoechoic mass at the 5:30 position 8 cm from the nipple. A 0.9 x 1 x 1 cm slightly irregular hypoechoic mass at the 10:30 position 9 cm from the nipple. A 3 x 1.9 x 3 cm probable lymph node without fatty hilum in the LOWER RIGHT axilla. A RIGHT axillary lymph node with slightly thickened cortex.  Accordingly on 07/10/2018 she proceeded to biopsy of the right breast area in question. The pathology from this procedure showed ([id number]): invasive ductal carcinoma, high grade, 5:30 o'clock. Prognostic indicators significant for: estrogen receptor, 30% positive with weak staining intensity and progesterone receptor, 0% negative. Proliferation marker Ki67 at 70%.   An additional biopsy was performed on the same day ([id number]) showing:  2. Breast, right, needle core biopsy, 10 o'clock - Invasive carcinoma  Finally, an additional biopsy was performed at the right axilla on the same day ([id number]) showing: invasive carcinoma, right axilla. Prognostic indicators significant for: estrogen receptor, 70% positive, with weak staining intensity and progesterone receptor, 0% negative. Proliferation marker Ki67 at 70%. HER2 positive by immunohistochemistry.    The patient's subsequent history is as detailed below.   PAST   MEDICAL HISTORY: Past Medical History:  Diagnosis Date  . Allergy   . Arthritis   . Asthma   . Cancer [facility])    R breast cancer  . Diabetes mellitus without complication  (HCC)    no meds now  . Family history of adverse reaction to anesthesia    "entire family takes a long time to wake up- N&V for several days"  . Family history of lung cancer   . Hypertension   . Migraine   . PONV (postoperative nausea and vomiting)     PAST SURGICAL HISTORY: Past Surgical History:  Procedure Laterality Date  . BREAST CYST EXCISION  1994  . BREAST LUMPECTOMY WITH RADIOACTIVE SEED AND SENTINEL LYMPH NODE BIOPSY Right 12/27/2018   Procedure: RIGHT BREAST LUMPECTOMY WITH RADIOACTIVE SEED X2 AND RIGHT SEED TARGETED AXILLARY SENTINEL LYMPH NODE BIOPSY;  Surgeon: Autumn Overall, MD;  Location: [place];  Service: General;  Laterality: Right;  . DILATION AND CURETTAGE OF UTERUS    . PORTACATH PLACEMENT Right 08/01/2018   Procedure: INSERTION PORT-A-CATH WITH ULTRASOUND;  Surgeon: Autumn Overall, MD;  Location: [place];  Service: General;  Laterality: Right;    FAMILY HISTORY: Family History  Problem Relation Age of Onset  . Hypertension Father   . Cancer Father 22       lung cancer  . Stroke Father   . Heart disease Mother        arrhythmia  . Hypertension Mother   . Kidney failure Sister    Autumn Cook father died from lung cancer at age 35; he was a heavy smoker. Patients' mother is 30 as of 07/2018. The patient has 3 brothers and 2 sisters. Patient denies anyone in her family having breast, ovarian, prostate, or pancreatic cancer. Autumn Cook's maternal great great uncle was diagnosed with lung cancer.     GYNECOLOGIC HISTORY:  Patient's last menstrual period was 11/14/2013. 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 Her husband, Autumn Cook, is a Dietitian.  The patient separated from her husband in January 2021 and they are planning on an eventual divorce.  Autumn Cook has 4 children, Autumn Cook, Autumn Cook, and Autumn Cook. Autumn Cook is 10, lives in [place], and works in Science writer. Autumn Cook is 20, lives in [place], and works in Engineer, technical sales. Autumn Cook is 82, is taking care of Autumn Cook, and works as a Control and instrumentation engineer. Autumn Cook is 70, lives in [place], and is a Buyer, retail.    ADVANCED DIRECTIVES: At the 06/23/2019 visit the patient was given the appropriate documents to complete and notarized at her discretion.  She is planning to name her daughter Autumn Cook as her healthcare power of attorney.  She can be reached at [phone].   HEALTH MAINTENANCE: Social History   Tobacco Use  . Smoking status: Former Smoker    Packs/day: 1.00    Years: 5.00    Pack years: 5.00    Quit date: 05/08/1988    Years since quitting: 31.5  . Smokeless tobacco: Never Used  Vaping Use  . Vaping Use: Never used  Substance Use Topics  . Alcohol use: Yes    Comment: social  . Drug use: No    Colonoscopy: yes,  2010 - due 2020.  PAP: 2017  Bone density: yes, 2015, normal Mammography: 07/01/2018   Allergies  Allergen Reactions  . Penicillins Hives    Did it involve swelling of the face/tongue/throat, SOB, or low BP? Yes--syncope episode Did it involve sudden or severe rash/hives, skin peeling, or any reaction on the inside of your mouth or nose? No Did you need to seek medical attention at a hospital or doctor's office? Yes When did it last happen? 37-38 YEARS AGO If all above answers are "NO", may proceed with cephalosporin use.   . Metformin And Related Diarrhea  . Adhesive [Tape] Other (See Comments)    "severe bruising"  ALSO SKIN GLUE  . Asa [Aspirin] Other (See Comments)    GI upset   . Mango Flavor Hives  . Olive Tree Hives    Pt tolerates olive oil    Current Outpatient Medications  Medication Sig Dispense Refill  . Accu-Chek FastClix  Lancets MISC 1 each by Does not apply route 2 (two) times daily. 100 each 3  . acetaminophen (TYLENOL) 500 MG tablet Take 1 tablet (500 mg total) by mouth 3 (three) times daily. 90 tablet 1  . albuterol (VENTOLIN HFA) 108 (90 Base) MCG/ACT inhaler Inhale 2 puffs into the lungs every 4 (four) hours as needed for wheezing or shortness of breath (cough, shortness of breath or wheezing.). 1 Inhaler 1  . amLODipine (NORVASC) 5 MG tablet Take 5 mg by mouth daily.    . atenolol (TENORMIN) 100 MG tablet TAKE 1 TABLET BY MOUTH EVERY DAY (Patient taking differently: Take 100 mg by mouth at bedtime. ) 30 tablet 5  . blood glucose meter kit and supplies KIT Dispense based on patient and insurance preference. Use up to four times daily as directed. (FOR ICD-9 250.00, 250.01). 1 each 0  . capecitabine (XELODA) 500 MG tablet Take 3 tablets (1,500 mg total) by mouth 2 (two) times daily after a meal. Take daily for 14 days, then do not take for 7 days, then repeat. 84 tablet 6  . famotidine (PEPCID) 20 MG tablet Take 1 tablet (20 mg total) by mouth 2 (two) times daily. 60 tablet 1  . fentaNYL (DURAGESIC) 12 MCG/HR Place 1 patch onto the skin every 3 (three) days. 10 patch 0  . fluticasone-salmeterol (ADVAIR HFA) 115-21 MCG/ACT inhaler Inhale 2 puffs into the lungs 2 (two) times daily. 1 Inhaler 12  . glucose blood (ACCU-CHEK ACTIVE STRIPS) test strip Use as instructed- check blood sugar up to 2x a day 100 each 12  . KLOR-CON M20 20 MEQ tablet Take 20 mEq by mouth at bedtime.     . Lancets Misc. (ACCU-CHEK FASTCLIX LANCET) KIT 1 each by Does not apply route 2 (two) times daily. 1 kit 1  . ondansetron (ZOFRAN ODT) 4 MG disintegrating tablet Take 1 tablet (4 mg total) by mouth every 8 (eight) hours as needed for nausea or vomiting. 20 tablet 2  . promethazine (PHENERGAN) 25 MG tablet Take 1 tablet (25 mg total) by mouth every 6 (six) hours as needed for nausea or vomiting. 30 tablet 0  . RIVAROXABAN (XARELTO) VTE STARTER  PACK (15 & 20 MG TABLETS) Follow package directions: Take one 15mg tablet by mouth twice a day. On day 22, switch to one 20mg tablet once a day. Take with food. 51 each 0  . traMADol (ULTRAM) 50 MG tablet Take 2 tablets (100 mg total) by mouth every 6 (six) hours as needed for moderate pain.   40 tablet 0  . venlafaxine XR (EFFEXOR-XR) 37.5 MG 24 hr capsule Take 1 capsule (37.5 mg total) by mouth daily with breakfast. 90 capsule 4  . Vitamin D, Ergocalciferol, (DRISDOL) 1.25 MG (50000 UNIT) CAPS capsule TAKE 1 CAPSULE BY MOUTH ONE TIME PER WEEK (Patient taking differently: Take 50,000 Units by mouth every Saturday. ) 4 capsule 5   No current facility-administered medications for this visit.    OBJECTIVE:  African-American woman examined in a wheelchair Vitals:   12/02/19 1520  BP: 104/69  Pulse: 80  Resp: 18  Temp: 98.3 F (36.8 C)  SpO2: 97%     Body mass index is 25.84 kg/m.   Wt Readings from Last 3 Encounters:  12/02/19 160 lb 1.6 oz (72.6 kg)  11/09/19 173 lb 3.2 oz (78.6 kg)  10/15/19 176 lb (79.8 kg)    ECOG FS:1 - Symptomatic but completely ambulatory  Sclerae unicteric, EOMs intact Wearing a mask Lungs diminished breath sounds on left w dullness to percussion as before Heart regular rate and rhythm Abd soft, nontender, positive bowel sounds MSK no focal spinal tenderness, no ankle edema Neuro: nonfocal, well oriented, appropriate affect Breasts: the right breast is s/p lumpectomy and radiation, no evidence of recurrence; left breast some central thickness w/o a well-defined mass   LAB RESULTS:  CMP     Component Value Date/Time   NA 139 12/02/2019 1435   K 3.8 12/02/2019 1435   CL 99 12/02/2019 1435   CO2 28 12/02/2019 1435   GLUCOSE 136 (H) 12/02/2019 1435   BUN 19 12/02/2019 1435   CREATININE 0.92 12/02/2019 1435   CREATININE 0.89 10/02/2019 1416   CREATININE 0.70 04/14/2015 1558   CALCIUM 10.3 12/02/2019 1435   PROT 6.8 12/02/2019 1435   ALBUMIN 2.5 (L)  12/02/2019 1435   AST 30 12/02/2019 1435   AST 15 10/02/2019 1416   ALT 11 12/02/2019 1435   ALT 10 10/02/2019 1416   ALKPHOS 81 12/02/2019 1435   BILITOT 0.4 12/02/2019 1435   BILITOT 0.2 (L) 10/02/2019 1416   GFRNONAA >60 12/02/2019 1435   GFRNONAA >60 10/02/2019 1416   GFRNONAA 87 03/27/2014 1130   GFRAA >60 12/02/2019 1435   GFRAA >60 10/02/2019 1416   GFRAA >89 03/27/2014 1130    No results found for: TOTALPROTELP, ALBUMINELP, A1GS, A2GS, BETS, BETA2SER, GAMS, MSPIKE, SPEI  No results found for: KPAFRELGTCHN, LAMBDASER, KAPLAMBRATIO  Lab Results  Component Value Date   WBC 6.6 12/02/2019   NEUTROABS 4.8 12/02/2019   HGB 6.8 (LL) 12/02/2019   HCT 20.7 (L) 12/02/2019   MCV 85.9 12/02/2019   PLT 467 (H) 12/02/2019   No results found for: LABCA2  No components found for: LABCAN125  No results for input(s): INR in the last 168 hours.  No results found for: LABCA2  No results found for: CAN199  No results found for: CAN125  No results found for: CAN153  Lab Results  Component Value Date   CA2729 84.6 (H) 12/02/2019    No components found for: HGQUANT  No results found for: CEA1 / No results found for: CEA1   No results found for: AFPTUMOR  No results found for: CHROMOGRNA  No results found for: HGBA, HGBA2QUANT, HGBFQUANT, HGBSQUAN (Hemoglobinopathy evaluation)   Lab Results  Component Value Date   LDH 423 (H) 10/16/2019    Lab Results  Component Value Date   IRON 34 11/03/2019   TIBC 153 (L) 11/03/2019   IRONPCTSAT 22 11/03/2019   (Iron   and TIBC)  Lab Results  Component Value Date   FERRITIN 1,284 (H) 11/03/2019    Urinalysis    Component Value Date/Time   COLORURINE YELLOW 10/31/2019 0537   APPEARANCEUR CLEAR 10/31/2019 0537   LABSPEC 1.023 10/31/2019 0537   PHURINE 5.0 10/31/2019 0537   GLUCOSEU NEGATIVE 10/31/2019 0537   HGBUR NEGATIVE 10/31/2019 0537   BILIRUBINUR NEGATIVE 10/31/2019 0537   BILIRUBINUR neg 11/24/2013 1547    KETONESUR 5 (A) 10/31/2019 0537   PROTEINUR NEGATIVE 10/31/2019 0537   UROBILINOGEN 0.2 11/24/2013 1547   UROBILINOGEN 0.2 11/10/2006 1153   NITRITE NEGATIVE 10/31/2019 0537   LEUKOCYTESUR NEGATIVE 10/31/2019 0537     STUDIES:  CT CHEST WO CONTRAST  Result Date: 11/09/2019 CLINICAL DATA:  Pleural effusion. Malignancy is suspected. Metastatic breast cancer. Severe LEFT chest pain. Swelling of the LEFT arm. EXAM: CT CHEST WITHOUT CONTRAST TECHNIQUE: Multidetector CT imaging of the chest was performed following the standard protocol without IV contrast. COMPARISON:  10/20/2019 FINDINGS: Cardiovascular: Heart and mediastinal structures are shifted towards the RIGHT. Heart is otherwise unremarkable. The noncontrast appearance of the aorta and pulmonary arteries is unremarkable. Mediastinum/Nodes: Mediastinal shift towards the RIGHT. Significant artifact limits evaluation of the thyroid gland. Enlarged LEFT axillary lymph nodes, measuring up to 11 millimeter short axis. No mediastinal adenopathy. Esophagus is unremarkable. Lungs/Pleura: Small Pleurx catheter is identified at the base of the LEFT pleural space. The entire LEFT lung is collapsed. Posteriorly, low-attenuation collection appears more discrete and spans the LEFT hemithorax. Collection measures 10.6 x 9.6 x 15.5 centimeters. Small amount of air is identified in the nondependent apical portion of this collection. Density measurements of this low-attenuation collection are indeterminate for simple fluid. A second small collection is identified in the anterior pleural space and also contains a small amount of air and measures 5.0 x 3.1 by 3.5 centimeters. Thickened, irregular pleura. The LEFT hemithorax is expanded, as before. Upper Abdomen: Low-attenuation lesion within the liver measures at least 3.8 centimeters. Musculoskeletal: No fracture is seen. Other: Irregular low-attenuation mass is identified in the posterior aspect of the RIGHT breast, 2.9 x  2.3 centimeters, likely site of prior lumpectomy. The LEFT breast is enlarged and not completely imaged. There is skin and trabecular thickening of the LEFT breast which is asymmetrically enlarged compared to the RIGHT. IMPRESSION: 1. Large low-attenuation collection involving the LEFT hemithorax, measuring 10.6 x 9.6 x 15.5 centimeters. Small amount of air in the nondependent apical portion of this collection. A second small collection is identified in the anterior pleural space and also contains a small amount of air. Persistent expansion of the LEFT hemithorax with mediastinal shift towards the RIGHT. 2. Enlarged LEFT axillary lymph nodes, suspicious for metastatic disease. 3. Irregular low-attenuation mass in the posterior aspect of the RIGHT breast, likely site of prior lumpectomy. 4. Enlarged LEFT breast with skin and trabecular thickening of the LEFT breast which is asymmetrically enlarged compared to the RIGHT. 5. Stable low-attenuation lesion within the liver, measuring at least 3.8 centimeters. 6. Aortic Atherosclerosis (ICD10-I70.0). Electronically Signed   By: Elizabeth  Brown M.D.   On: 11/09/2019 14:43   DG CHEST PORT 1 VIEW  Result Date: 11/10/2019 CLINICAL DATA:  Status post thoracentesis. EXAM: PORTABLE CHEST 1 VIEW COMPARISON:  Single-view of the chest 10/29/2019. CT chest 11/29/2019. FINDINGS: Complete whiteout of the left chest is worse than on the prior plain film where there was some aerated lung in the apex. No pneumothorax. Left pleural drainage catheter is in place. Right lung   appears clear. The cardiac silhouette is largely obscured. IMPRESSION: Negative for pneumothorax after thoracentesis. Complete whiteout of the left chest consistent due to a pleural effusion and airspace disease. Pleural drainage catheter is in place in the left chest. Electronically Signed   By: Thomas  Dalessio M.D.   On: 11/10/2019 15:25   US RT UPPER EXTREM LTD SOFT TISSUE NON VASCULAR  Result Date:  11/13/2019 CLINICAL DATA:  Sensation of lump along proximal humerus region. EXAM: ULTRASOUND right UPPER EXTREMITY LIMITED TECHNIQUE: Ultrasound examination of the upper extremity soft tissues was performed in the area of clinical concern. COMPARISON:  None. FINDINGS: No appreciable abnormality of the regional musculature or overlying subcutaneous tissues is identified. IMPRESSION: No significant abnormality identified. Electronically Signed   By: Walter  Liebkemann M.D.   On: 11/13/2019 05:51   VAS US UPPER EXTREMITY VENOUS DUPLEX  Result Date: 11/10/2019 UPPER VENOUS STUDY  Indications: Pain, and Swelling Limitations: Poor ultrasound/tissue interface and line. Comparison Study: No prior studies. Performing Technologist: Gregory Collins RVT  Examination Guidelines: A complete evaluation includes B-mode imaging, spectral Doppler, color Doppler, and power Doppler as needed of all accessible portions of each vessel. Bilateral testing is considered an integral part of a complete examination. Limited examinations for reoccurring indications may be performed as noted.  Right Findings: +----------+------------+---------+-----------+----------+-------+ RIGHT     CompressiblePhasicitySpontaneousPropertiesSummary +----------+------------+---------+-----------+----------+-------+ Subclavian    Full       Yes       Yes                      +----------+------------+---------+-----------+----------+-------+  Left Findings: +----------+------------+---------+-----------+----------+-------+ LEFT      CompressiblePhasicitySpontaneousPropertiesSummary +----------+------------+---------+-----------+----------+-------+ IJV           None       No        No                Acute  +----------+------------+---------+-----------+----------+-------+ Subclavian    None       No        No                Acute  +----------+------------+---------+-----------+----------+-------+ Axillary      None       No         No                Acute  +----------+------------+---------+-----------+----------+-------+ Brachial      None       No        No                Acute  +----------+------------+---------+-----------+----------+-------+ Radial        Full                                          +----------+------------+---------+-----------+----------+-------+ Ulnar         Full                                          +----------+------------+---------+-----------+----------+-------+ Cephalic      Full                                          +----------+------------+---------+-----------+----------+-------+   Basilic       None                                          +----------+------------+---------+-----------+----------+-------+  Summary:  Right: No evidence of thrombosis in the subclavian.  Left: Findings consistent with acute deep vein thrombosis involving the left internal jugular vein, left subclavian vein, left axillary vein and left brachial veins. Findings consistent with acute superficial vein thrombosis involving the left basilic vein.  *See table(s) above for measurements and observations.  Diagnosing physician: Monica Martinez MD Electronically signed by Monica Martinez MD on 11/10/2019 at 10:55:45 AM.    Final      ELIGIBLE FOR AVAILABLE RESEARCH PROTOCOL: No   ASSESSMENT: 59 y.o. [place], [place] woman status post right breast biopsy x2 on 07/10/2018 for multicentric invasive ductal carcinoma, clinically T1c N1, stage IIB, grade 3, functionally triple negative, with an MIB-1 of 70%  (a) right axillary lymph node biopsied at the same time was positive  (1) neoadjuvant chemotherapy consisting of doxorubicin and cyclophosphamide in dose dense fashion x4 started 08/07/2018, completed 09/16/2018, followed by weekly Abraxane and carboplatin x12 starting 08/07/2018, stopped after 2 cycles (last dose on 10/15/2018) due to peripheral neuropathy   (2) Right breast  lumpectomy on 12/27/2018 shows a ypT1b ypN0 residual invasive ductal carcinoma, margins negative.  (a) 5 sentinel nodes were negative.   (b) Estrogen receptor 75% positive, weak, Progesterone receptor negative.  (3) adjuvant radiation  Radiation Treatment Dates: 02/18/2019 through 04/02/2019 Site Technique Total Dose (Gy) Dose per Fx (Gy) Completed Fx Beam Energies  Breast: Breast_Rt 3D 50.4/50.4 1.8 28/28 6X, 10X  Breast: Breast_Rt_axilla 3D 50.4/50.4 1.8 28/28 6X, 10X  Breast: Breast_Rt_Bst 3D 20/20 2 10/10 6X, 10X    (4) genetics testing 08/06/2018 through the Breast + GYN Cancers Panel offered by [facility] found no deleterious mutations in ATM, BARD1, BRCA1, BRCA2, BRIP1, CDH1, CHEK2, DICER1, EPCAM, MLH1, MSH2, MSH6, NBN, NF1, PALB2, PMS2, PTEN, RAD50, RAD51C, RAD51D, SMARCA4, STK11, TP53.   (5) anastrozole started 05/14/2018  (a) bone density 09/22/2019 shows a T score of -0.8 (normal)  (b) anastrozole discontinued June 2021, with evidence of progression (metastases)  METASTATIC DISEASE: June 2021: left effusion, liver metastases  (6)CT renalstonestudy 09/30/2019 to evaluate left flank pain shows left pleural effusion and possible liver lesions (a)cytology from the left pleural effusion 10/03/2019 confirms metastatic disease (b)liver MRI 10/04/2019 shows multiple liver lesions (c) prognostic panel requested 10/10/2019 on cytology from 10/03/2019 fluid finds the tumor cells negative for HER-2, estrogen receptor, or the progesterone receptor, with an MIB-1 of 60%.  (d) noncontrast chest CT 11/09/2019 shows a large low-attenuation collection in the left hemithorax measuring up to 15.5 cm, with expansion of the left hemithorax to the right, there are also enlarged left axillary nodes, and left breast skin thickening; liver lesion measuring 3.8 cm  (e) CA 27-29 moderately informative (84.6 on 12/02/2019)  (7) left effusion: left pleurx placement  10/13/2019        (a) Ct chest 11/09/2019 shows continuing left lung collapse, loculated pockets of fluid  (b) pleurx catheter removed 11/12/2019--left lung not expected to re-expand  (8) DVT left internal jugular, subclavian, axillary and brachial vv diagnosed by doppler 11/09/2019        (a) IV heparin started 11/09/2019, briefly on Lovenox  (b) transitioned to rivaroxaban 11/11/2019  (9) capecitabine started 10/28/2019 at standard doses        (  a) cycle 2 resumed 11/18/2019   PLAN: Autumn Cook is clinically improved. She is more positive and forward-looking. She is still dependent on her children for 24/7 care--daughter Autumn Cook is about to pass the baton to daughter Autumn Cook. Autumn Cook is benefitting from HHPTx and the hope is she can eventually become at least moderately independent.  She is tolerating capecitabine well and we have measurable disease in the liver we can follow, also the CA 27-29. She will start her next cycle and return to see me 12/24/2019. If all goes well we will repeat a liver MRI after cycle 4.  Our SW Autumn Cook has been working with the family and arranged for transportation as needed. Autumn Cook will return tomorrow for transfusion and today we discussed the possible complications toxicities and side effects of blood transfusions. She will continue on HOMO2. Pain is currently not a problem.  We discussed transfusion including possible toxicities side effects and complications and the patient will proceed to transfusion the on 12/03/2027.  At the next visit we will review advanced directives especially as I am not sure what her separation/divorce status is at this point.  Total encounter time 35 minutes.   Virgie Dad. Autumn Marcoux, MD  12/03/19 8:08 AM Medical Oncology and Hematology [facility] [place], [place] [address] Tel. [phone]    Fax. [phone]   I, Wilburn Mylar, am acting as scribe for Dr. Virgie Dad. Autumn Cook.  I, Lurline Del MD, have reviewed the above documentation for accuracy and completeness, and I agree with the above.   *Total Encounter Time as defined by the Centers for Medicare and Medicaid Services includes, in addition to the face-to-face time of a patient visit (documented in the note above) non-face-to-face time: obtaining and reviewing outside history, ordering and reviewing medications, tests or procedures, care coordination (communications with other health care professionals or caregivers) and documentation in the medical record.

## 2019-12-02 NOTE — Progress Notes (Signed)
[employer] CSW Progress Note  Holiday representative met with patient and daughter, [name], to follow up on resource connection. They were able to use wheelchair transportation through [facility] today ([employer]) but had trouble getting down the couple of stairs outside the home. Would like help with wheelchair ramp if possible.  Patient signed authorization form for [facility] and CSW submitted today. Also provided [employer] and hard copy of [employer] [place] application for family to complete at home.  CSW will continue to follow for resource needs.   Edwinna Areola Hayat Warbington LCSW

## 2019-12-03 ENCOUNTER — Other Ambulatory Visit: Payer: Self-pay | Admitting: Oncology

## 2019-12-03 ENCOUNTER — Telehealth: Payer: Self-pay | Admitting: Licensed Clinical Social Worker

## 2019-12-03 ENCOUNTER — Ambulatory Visit: Payer: 59

## 2019-12-03 DIAGNOSIS — C787 Secondary malignant neoplasm of liver and intrahepatic bile duct: Secondary | ICD-10-CM

## 2019-12-03 LAB — CANCER ANTIGEN 27.29: CA 27.29: 84.6 U/mL — ABNORMAL HIGH (ref 0.0–38.6)

## 2019-12-03 NOTE — Telephone Encounter (Signed)
[place] Work   CSW spoke with pt's daughter, [name], to confirm that home is owned in order to explore option for having a wheelchair ramp built.  Option may be through [employer] [phone]). CSW left message to determine details about the process.   Edwinna Areola Mikaelah Trostle , LCSW

## 2019-12-04 ENCOUNTER — Other Ambulatory Visit: Payer: Self-pay

## 2019-12-04 ENCOUNTER — Telehealth: Payer: Self-pay | Admitting: Oncology

## 2019-12-04 ENCOUNTER — Other Ambulatory Visit: Payer: Self-pay | Admitting: Oncology

## 2019-12-04 ENCOUNTER — Inpatient Hospital Stay: Payer: 59

## 2019-12-04 DIAGNOSIS — C50911 Malignant neoplasm of unspecified site of right female breast: Secondary | ICD-10-CM | POA: Diagnosis not present

## 2019-12-04 DIAGNOSIS — D6481 Anemia due to antineoplastic chemotherapy: Secondary | ICD-10-CM

## 2019-12-04 DIAGNOSIS — G893 Neoplasm related pain (acute) (chronic): Secondary | ICD-10-CM

## 2019-12-04 DIAGNOSIS — C787 Secondary malignant neoplasm of liver and intrahepatic bile duct: Secondary | ICD-10-CM

## 2019-12-04 MED ORDER — DIPHENHYDRAMINE HCL 25 MG PO CAPS
ORAL_CAPSULE | ORAL | Status: AC
  Filled 2019-12-04: qty 1

## 2019-12-04 MED ORDER — RIVAROXABAN (XARELTO) VTE STARTER PACK (15 & 20 MG): ORAL_TABLET | ORAL | 0 refills | Status: DC

## 2019-12-04 MED ORDER — TRAMADOL HCL 50 MG PO TABS: 100.0000 mg | ORAL_TABLET | Freq: Four times a day (QID) | ORAL | 0 refills | Status: DC | PRN

## 2019-12-04 MED ORDER — FENTANYL 12 MCG/HR TD PT72: 1.0000 | MEDICATED_PATCH | TRANSDERMAL | 0 refills | Status: AC

## 2019-12-04 MED ORDER — ACETAMINOPHEN 325 MG PO TABS
ORAL_TABLET | ORAL | Status: AC
  Filled 2019-12-04: qty 2

## 2019-12-04 MED ORDER — SODIUM CHLORIDE 0.9% IV SOLUTION
250.0000 mL | Freq: Once | INTRAVENOUS | Status: AC
  Administered 2019-12-04: 250 mL via INTRAVENOUS
  Filled 2019-12-04: qty 250

## 2019-12-04 MED ORDER — FENTANYL 12 MCG/HR TD PT72: 1.0000 | MEDICATED_PATCH | TRANSDERMAL | 0 refills | Status: DC

## 2019-12-04 MED ORDER — PROMETHAZINE HCL 25 MG PO TABS: 25.0000 mg | ORAL_TABLET | Freq: Four times a day (QID) | ORAL | 0 refills | Status: AC | PRN

## 2019-12-04 MED ORDER — DIPHENHYDRAMINE HCL 25 MG PO CAPS
25.0000 mg | ORAL_CAPSULE | Freq: Once | ORAL | Status: AC
  Administered 2019-12-04: 25 mg via ORAL

## 2019-12-04 MED ORDER — PROMETHAZINE HCL 25 MG PO TABS: 25.0000 mg | ORAL_TABLET | Freq: Four times a day (QID) | ORAL | 0 refills | Status: DC | PRN

## 2019-12-04 MED ORDER — ACETAMINOPHEN 325 MG PO TABS
650.0000 mg | ORAL_TABLET | Freq: Once | ORAL | Status: AC
  Administered 2019-12-04: 650 mg via ORAL

## 2019-12-04 NOTE — Progress Notes (Signed)
Pt received 2 units PRBCs today, tolerated well.  VSS.  Able to eat/drink and use restroom during tx as needed without any issues.  Pt d/c with belongings and home O2 tank.

## 2019-12-04 NOTE — Telephone Encounter (Signed)
Received call back from Darylene Price with [employer]. Supplied additional information and Lattie Haw will work on putting through request as an Electronics engineer. She will call back with progress. Darylene Price: [place] Keeven, LCSW

## 2019-12-04 NOTE — Telephone Encounter (Signed)
Scheduled appts per 7/27 los. Pt's voicemail box was full. Mailed appt reminder and calendar.

## 2019-12-04 NOTE — Patient Instructions (Signed)
Blood Transfusion, Adult, Care After This sheet gives you information about how to care for yourself after your procedure. Your doctor may also give you more specific instructions. If you have problems or questions, contact your doctor. What can I expect after the procedure? After the procedure, it is common to have:  Bruising and soreness at the IV site.  A fever or chills on the day of the procedure. This may be your body's response to the new blood cells received.  A headache. Follow these instructions at home: Insertion site care      Follow instructions from your doctor about how to take care of your insertion site. This is where an IV tube was put into your vein. Make sure you: ? Wash your hands with soap and water before and after you change your bandage (dressing). If you cannot use soap and water, use hand sanitizer. ? Change your bandage as told by your doctor.  Check your insertion site every day for signs of infection. Check for: ? Redness, swelling, or pain. ? Bleeding from the site. ? Warmth. ? Pus or a bad smell. General instructions  Take over-the-counter and prescription medicines only as told by your doctor.  Rest as told by your doctor.  Go back to your normal activities as told by your doctor.  Keep all follow-up visits as told by your doctor. This is important. Contact a doctor if:  You have itching or red, swollen areas of skin (hives).  You feel worried or nervous (anxious).  You feel weak after doing your normal activities.  You have redness, swelling, warmth, or pain around the insertion site.  You have blood coming from the insertion site, and the blood does not stop with pressure.  You have pus or a bad smell coming from the insertion site. Get help right away if:  You have signs of a serious reaction. This may be coming from an allergy or the body's defense system (immune system). Signs include: ? Trouble breathing or shortness of  breath. ? Swelling of the face or feeling warm (flushed). ? Fever or chills. ? Head, chest, or back pain. ? Dark pee (urine) or blood in the pee. ? Widespread rash. ? Fast heartbeat. ? Feeling dizzy or light-headed. You may receive your blood transfusion in an outpatient setting. If so, you will be told whom to contact to report any reactions. These symptoms may be an emergency. Do not wait to see if the symptoms will go away. Get medical help right away. Call your local emergency services (911 in the [place] Do not drive yourself to the hospital. Summary  Bruising and soreness at the IV site are common.  Check your insertion site every day for signs of infection.  Rest as told by your doctor. Go back to your normal activities as told by your doctor.  Get help right away if you have signs of a serious reaction. This information is not intended to replace advice given to you by your health care provider. Make sure you discuss any questions you have with your health care provider. Document Revised: 10/17/2018 Document Reviewed: 10/17/2018 Elsevier Patient Education  2020 Elsevier Inc.     Blood Transfusion, Adult A blood transfusion is a procedure in which you receive blood or a type of blood cell (blood component) through an IV. You may need a blood transfusion when your blood level is low. This may result from a bleeding disorder, illness, injury, or surgery. The blood may come   from a donor. You may also be able to donate blood for yourself (autologous blood donation) before a planned surgery. The blood given in a transfusion is made up of different blood components. You may receive:  Red blood cells. These carry oxygen to the cells in the body.  Platelets. These help your blood to clot.  Plasma. This is the liquid part of your blood. It carries proteins and other substances throughout the body.  White blood cells. These help you fight infections. If you have hemophilia or  another clotting disorder, you may also receive other types of blood products. Tell a health care provider about:  Any blood disorders you have.  Any previous reactions you have had during a blood transfusion.  Any allergies you have.  All medicines you are taking, including vitamins, herbs, eye drops, creams, and over-the-counter medicines.  Any surgeries you have had.  Any medical conditions you have, including any recent fever or cold symptoms.  Whether you are pregnant or may be pregnant. What are the risks? Generally, this is a safe procedure. However, problems may occur.  The most common problems include: ? A mild allergic reaction, such as red, swollen areas of skin (hives) and itching. ? Fever or chills. This may be the body's response to new blood cells received. This may occur during or up to 4 hours after the transfusion.  More serious problems may include: ? Transfusion-associated circulatory overload (TACO), or too much fluid in the lungs. This may cause breathing problems. ? A serious allergic reaction, such as difficulty breathing or swelling around the face and lips. ? Transfusion-related acute lung injury (TRALI), which causes breathing difficulty and low oxygen in the blood. This can occur within hours of the transfusion or several days later. ? Iron overload. This can happen after receiving many blood transfusions over a period of time. ? Infection or virus being transmitted. This is rare because donated blood is carefully tested before it is given. ? Hemolytic transfusion reaction. This is rare. It happens when your body's defense system (immune system)tries to attack the new blood cells. Symptoms may include fever, chills, nausea, low blood pressure, and low back or chest pain. ? Transfusion-associated graft-versus-host disease (TAGVHD). This is rare. It happens when donated cells attack your body's healthy tissues. What happens before the  procedure? Medicines Ask your health care provider about:  Changing or stopping your regular medicines. This is especially important if you are taking diabetes medicines or blood thinners.  Taking medicines such as aspirin and ibuprofen. These medicines can thin your blood. Do not take these medicines unless your health care provider tells you to take them.  Taking over-the-counter medicines, vitamins, herbs, and supplements. General instructions  Follow instructions from your health care provider about eating and drinking restrictions.  You will have a blood test to determine your blood type. This is necessary to know what kind of blood your body will accept and to match it to the donor blood.  If you are going to have a planned surgery, you may be able to do an autologous blood donation. This may be done in case you need to have a transfusion.  You will have your temperature, blood pressure, and pulse monitored before the transfusion.  If you have had an allergic reaction to a transfusion in the past, you may be given medicine to help prevent a reaction. This medicine may be given to you by mouth (orally) or through an IV.  Set aside time for   the blood transfusion. This procedure generally takes 1-4 hours to complete. What happens during the procedure?   An IV will be inserted into one of your veins.  The bag of donated blood will be attached to your IV. The blood will then enter through your vein.  Your temperature, blood pressure, and pulse will be monitored regularly during the transfusion. This monitoring is done to detect early signs of a transfusion reaction.  Tell your nurse right away if you have any of these symptoms during the transfusion: ? Shortness of breath or trouble breathing. ? Chest or back pain. ? Fever or chills. ? Hives or itching.  If you have any signs or symptoms of a reaction, your transfusion will be stopped and you may be given medicine.  When the  transfusion is complete, your IV will be removed.  Pressure may be applied to the IV site for a few minutes.  A bandage (dressing)will be applied. The procedure may vary among health care providers and hospitals. What happens after the procedure?  Your temperature, blood pressure, pulse, breathing rate, and blood oxygen level will be monitored until you leave the hospital or clinic.  Your blood may be tested to see how you are responding to the transfusion.  You may be warmed with fluids or blankets to maintain a normal body temperature.  If you receive your blood transfusion in an outpatient setting, you will be told whom to contact to report any reactions. Where to find more information For more information on blood transfusions, visit the American Red Cross: redcross.org Summary  A blood transfusion is a procedure in which you receive blood or a type of blood cell (blood component) through an IV.  The blood you receive may come from a donor or be donated by yourself (autologous blood donation) before a planned surgery.  The blood given in a transfusion is made up of different blood components. You may receive red blood cells, platelets, plasma, or white blood cells depending on the condition treated.  Your temperature, blood pressure, and pulse will be monitored before, during, and after the transfusion.  After the transfusion, your blood may be tested to see how your body has responded. This information is not intended to replace advice given to you by your health care provider. Make sure you discuss any questions you have with your health care provider. Document Revised: 10/17/2018 Document Reviewed: 10/17/2018 Elsevier Patient Education  2020 Elsevier Inc.   

## 2019-12-05 ENCOUNTER — Other Ambulatory Visit: Payer: Self-pay | Admitting: *Deleted

## 2019-12-05 LAB — BPAM RBC
Blood Product Expiration Date: 202108272359
Blood Product Expiration Date: 202108272359
ISSUE DATE / TIME: 202107290908
ISSUE DATE / TIME: 202107290908
Unit Type and Rh: 5100
Unit Type and Rh: 5100

## 2019-12-05 LAB — TYPE AND SCREEN
ABO/RH(D): O POS
Antibody Screen: NEGATIVE
Unit division: 0
Unit division: 0

## 2019-12-09 ENCOUNTER — Encounter: Payer: Self-pay | Admitting: Oncology

## 2019-12-10 ENCOUNTER — Telehealth: Payer: Self-pay | Admitting: *Deleted

## 2019-12-10 ENCOUNTER — Other Ambulatory Visit: Payer: Self-pay | Admitting: *Deleted

## 2019-12-10 MED ORDER — VENLAFAXINE HCL ER 75 MG PO CP24
75.0000 mg | ORAL_CAPSULE | Freq: Every day | ORAL | 3 refills | Status: AC
Start: 2019-12-10 — End: ?

## 2019-12-10 NOTE — Telephone Encounter (Signed)
Per My Chart message:  Autumn Cook, [name] sent to P Onc Nurse Cc Hello,   My mom has said shes having a hard time using the phone - cant remember or figure out how to dial numbers. She wasnt having this issue before her hospitalization and would like to know why this is happening. As far as we know, there werent any cognitive issues noted during her hospital stay. Any insight would be appreciated.   This RN also received VM from [facility] the nurse with Autumn Cook stating pt is not eating or drinking well presently due to " ongoing nausea " . Return call for Autumn Cook is [phone].  This RN called Autumn Cook (daughter) for further inquiry.  Autumn Cook states mother's overall function is improved- just noted is more short memory and function. Autumn Cook states pt has great recall of long term memory.  Pt is able to function with known limitations for self care.  This RN inquired about nausea affecting intake.  Autumn Cook clarified that nausea can be controlled with the phenergan or zofran - and she believes the issue with intake is not the nausea as much as " she has no taste or desire to drink or eat "  Autumn Cook gave a rough estimate of caloric intake likely around or less then 600 calories a day- and maybe 8-10 ounces of fluid.  Pt does need a refill on the effexor " and Dr Jana Hakim mentioned that he might want to increase the dose ".  Above will be reviewed with MD and cal returned to [facility]

## 2019-12-11 ENCOUNTER — Encounter: Payer: Self-pay | Admitting: Licensed Clinical Social Worker

## 2019-12-11 ENCOUNTER — Telehealth: Payer: Self-pay | Admitting: *Deleted

## 2019-12-11 ENCOUNTER — Other Ambulatory Visit: Payer: Self-pay | Admitting: *Deleted

## 2019-12-11 MED ORDER — RIVAROXABAN 20 MG PO TABS
20.0000 mg | ORAL_TABLET | Freq: Every day | ORAL | 3 refills | Status: AC
Start: 2019-12-11 — End: ?

## 2019-12-11 NOTE — Progress Notes (Signed)
[place] CSW Progress Note  Clinical Education officer, museum contacted pt's daughter by phone to follow-up on ramp for home. Per [name], someone from [employer] came out last week and they will be sending a crew this or next week to build the wheelchair ramp. No other concerns on this issue.  [name] did have questions about another prescription for Xarelto. CSW sent questions to RN Val who will follow-up.    Edwinna Areola Lekha Dancer , LCSW

## 2019-12-11 NOTE — Telephone Encounter (Addendum)
Per MD review of concerns- recommended increasing the venlaxine to 75 mg for benefit of noted issues that could be related to depression.

## 2019-12-15 ENCOUNTER — Other Ambulatory Visit: Payer: Self-pay | Admitting: *Deleted

## 2019-12-16 ENCOUNTER — Telehealth: Payer: Self-pay | Admitting: *Deleted

## 2019-12-16 ENCOUNTER — Encounter: Payer: Self-pay | Admitting: Oncology

## 2019-12-16 NOTE — Telephone Encounter (Signed)
This RN spoke with [name] per her return call.  She states pt has been having " asleep most of the day yesterday and today "  Pain is in the abdomen " and can wake her up "  Pt is arousable from sleep and able to eat (minimally) and drink ( sparingly).  Per inquiry pt's last regular BM was 6 days ago with small amount 2 days ago.  She is passing gas.  [name] has given the patient colace daily " because that is what they were giving her in the hospital and she gave her 1 ducolax 2 days ago.  This RN discussed above and need for more aggressive approach with stool softners and ducolax.  Today [name] will given pt the Ducolax twice ( now and later around 8 pm ) with Colace .  Though pt is not drinking a lot - [name] will give pt warm liquids to assist with bowels.  Discussed also overall movement- getting out of bed helps the body with bowel movements as well as pain medications like the tramadol can worsen the constipation and lead to more pain.  [name] can use GasX if needed for abd/gas discomfort.  Note [name] states nurse per home care has limited visits and she is not scheduled for visit this week - next week the patient is scheduled to see Dr Jannifer Rodney and then the nurse will come the week of the 24th.  Plan is for [name] to institute above plan - and to call this RN in the AM with update.

## 2019-12-18 ENCOUNTER — Encounter: Payer: Self-pay | Admitting: Oncology

## 2019-12-19 ENCOUNTER — Telehealth: Payer: Self-pay | Admitting: *Deleted

## 2019-12-19 NOTE — Telephone Encounter (Signed)
This RN spoke with pt's daughter, [name], post a my chart message stating concern due to pt having increased weakness and confusion.  Note this RN also received a VM from the physical therapist who visited today who states " pt wants to lay in bed - today we sat on the edge of the bed and that was about it "  He stated concern for noted changes since 2 weeks ago when pt was more alert and ambulating better.  Per phone conversation with [name] this RN offered for pt to come in to [facility] today but if she needed blood likely would need to come in again tomorrow.  [name] states they have to use the [facility] transportation per pt uses the wheelchair ( cannot walk and get in and out of a car ).  This RN discussed appointment on 8/18 could be rescheduled to 8/16.  Pt's symptoms discussed in regard to possible causes including progressive disease or  increased use of pain medications earlier this week - now not being cleared well by her liver.  Discussed with [name] concerns for further decline over the weekend and if occurring they should call 911.  This RN will send rescheduling request for Monday with need for transportation

## 2019-12-22 ENCOUNTER — Telehealth: Payer: Self-pay

## 2019-12-22 ENCOUNTER — Other Ambulatory Visit: Payer: Self-pay | Admitting: Adult Health

## 2019-12-22 ENCOUNTER — Encounter: Payer: Self-pay | Admitting: Oncology

## 2019-12-22 ENCOUNTER — Inpatient Hospital Stay: Payer: 59 | Admitting: Medical

## 2019-12-22 ENCOUNTER — Inpatient Hospital Stay: Payer: 59

## 2019-12-22 DIAGNOSIS — G893 Neoplasm related pain (acute) (chronic): Secondary | ICD-10-CM

## 2019-12-22 MED ORDER — TRAMADOL HCL 50 MG PO TABS: 100.0000 mg | ORAL_TABLET | Freq: Four times a day (QID) | ORAL | 0 refills | Status: AC | PRN

## 2019-12-22 NOTE — Telephone Encounter (Signed)
Pt's daughter [name] called and states her mom, the pt is feeling much better after pushing fluids all weekend. [name] also states the appt made for today was contingent upon her mom not feeling any better, and nevr received information about transportation arrangements for today. Appts for today have been cancelled per [name]'s request, with anticipation to keep appts for 8/18/ [facility] requests refill for Tramadol. This request has been sent to Wilber Bihari, NP.

## 2019-12-22 NOTE — Progress Notes (Signed)
Refilling per pain protocol.  PMP aware reviewed.  Wilber Bihari, NP

## 2019-12-23 NOTE — Progress Notes (Signed)
[place]  [phone] [phone]    ID: Autumn Cook DOB: [birth year]  MR#: [id number]  ZPH#:[id number]  Patient Care Team: Darreld Mclean, MD as PCP - General (Family Medicine) Molli Posey, MD as Consulting Physician (Obstetrics and Gynecology) Mauro Kaufmann, RN as Oncology Nurse Navigator Rockwell Germany, RN as Oncology Nurse Navigator Alphonsa Overall, MD as Consulting Physician (General Surgery) Jacobe Study, Virgie Dad, MD as Consulting Physician (Oncology) Gery Pray, MD as Consulting Physician (Radiation Oncology)   CHIEF COMPLAINT:  triple negative breast cancer  CURRENT TREATMENT: [capecitabine]; rivaroxaban   INTERVAL HISTORY: Autumn Cook returns today for follow up of her 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  Currently this is her off week.  So far she has tolerated that well. In particular she has had no diarrhea, no nausea or vomiting, no mouth sores and no palmar plantar erythrodysesthesia.   We are now following her CA 27.29 with today's result pending Lab Results  Component Value Date   CA2729 84.6 (H) 12/02/2019    REVIEW OF SYSTEMS Autumn Cook is not doing well at home.  Since her hospitalization she has most of the time stayed in bed.  She has not benefited from physical therapy and has only minimally been able to participate in it.  We lowered her pain medicine to see if that made a difference but it has not and she is now more frequently acknowledging pain.  Today she says "I sleep a lot".  She is having incontinence of stool as well as urine.  She tells me she is not coughing very much but is  short of breath.  She has says she has pain with urination.  Her daughters (I also spoke with [name] by phone) tell me she seems weaker more recently even than usual.  She is eating less and drinking very little.  She is sleeping a lot.  3  weeks ago apparently she was able to walk a little with physical therapy but now she is pretty much bedbound.  She seems increasingly confused to the family.   HISTORY OF CURRENT ILLNESS: From the original intake note:  Autumn Cook had routine screening mammography on 07/01/2018 showing a possible abnormality in the right breast; she notes that did feel some pain and a lump in her breast leading up to the routine screening. She underwent right breast ultrasonography at [facility] on 07/05/2018 showing: A 1.4 x 1.2 x 1.4 cm oval hypoechoic mass at the 5:30 position 8 cm from the nipple. A 0.9 x 1 x 1 cm slightly irregular hypoechoic mass at the 10:30 position 9 cm from the nipple. A 3 x 1.9 x 3 cm probable lymph node without fatty hilum in the LOWER RIGHT axilla. A RIGHT axillary lymph node with slightly thickened cortex.  Accordingly on 07/10/2018 she proceeded to biopsy of the right breast area in question. The pathology from this procedure showed ([id number]): invasive ductal carcinoma, high grade, 5:30 o'clock. Prognostic indicators significant for: estrogen receptor, 30% positive with weak staining intensity and progesterone receptor, 0% negative. Proliferation marker Ki67 at 70%.   An additional biopsy was performed on the same day ([id number]) showing:  2. Breast, right, needle core biopsy, 10 o'clock - Invasive carcinoma  Finally, an additional biopsy was performed at the right axilla on the same day ([id number]) showing: invasive carcinoma, right axilla. Prognostic indicators significant for: estrogen receptor, 70% positive, with  weak staining intensity and progesterone receptor, 0% negative. Proliferation marker Ki67 at 70%. HER2 positive by immunohistochemistry.    The patient's subsequent history is as detailed below.   PAST MEDICAL HISTORY: Past Medical History:  Diagnosis Date  . Allergy   . Arthritis   . Asthma   . Cancer [facility])    R breast cancer  . Diabetes mellitus  without complication (HCC)    no meds now  . Family history of adverse reaction to anesthesia    "entire family takes a long time to wake up- N&V for several days"  . Family history of lung cancer   . Hypertension   . Migraine   . PONV (postoperative nausea and vomiting)     PAST SURGICAL HISTORY: Past Surgical History:  Procedure Laterality Date  . BREAST CYST EXCISION  1994  . BREAST LUMPECTOMY WITH RADIOACTIVE SEED AND SENTINEL LYMPH NODE BIOPSY Right 12/27/2018   Procedure: RIGHT BREAST LUMPECTOMY WITH RADIOACTIVE SEED X2 AND RIGHT SEED TARGETED AXILLARY SENTINEL LYMPH NODE BIOPSY;  Surgeon: Alphonsa Overall, MD;  Location: [facility];  Service: General;  Laterality: Right;  . DILATION AND CURETTAGE OF UTERUS    . PORTACATH PLACEMENT Right 08/01/2018   Procedure: INSERTION PORT-A-CATH WITH ULTRASOUND;  Surgeon: Alphonsa Overall, MD;  Location: [facility];  Service: General;  Laterality: Right;    FAMILY HISTORY: Family History  Problem Relation Age of Onset  . Hypertension Father   . Cancer Father 68       lung cancer  . Stroke Father   . Heart disease Mother        arrhythmia  . Hypertension Mother   . Kidney failure Sister    Sharyah's father died from lung cancer at age 34; he was a heavy smoker. Patients' mother is 32 as of 07/2018. The patient has 3 brothers and 2 sisters. Patient denies anyone in her family having breast, ovarian, prostate, or pancreatic cancer. Autumn Cook's maternal great great uncle was diagnosed with lung cancer.     GYNECOLOGIC HISTORY:  Patient's last menstrual period was 11/14/2013. 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 Her husband,  [name], is a Administrator.  The patient separated from her husband in January 2021 and they are planning on an eventual divorce.  Kashayla has 4 children, [name], Eppie Gibson, and [name]. [name] is 26, lives in [place], and works in Science writer. [name] is 35, lives in [place], and works in Engineer, technical sales. [name] is 38, lives in [place] and works as a Control and instrumentation engineer. [name] is 19, lives in [place], and is a Buyer, retail.    ADVANCED DIRECTIVES: At the 06/23/2019 visit the patient was given the appropriate documents to complete and notarized at her discretion.  She is planning to name her daughter [name] as her healthcare power of attorney.  She can be reached at [phone].   HEALTH MAINTENANCE: Social History   Tobacco Use  . Smoking status: Former Smoker    Packs/day: 1.00    Years: 5.00    Pack years: 5.00    Quit date: 05/08/1988    Years since quitting: 31.6  . Smokeless tobacco: Never Used  Vaping Use  .  Vaping Use: Never used  Substance Use Topics  . Alcohol use: Yes    Comment: social  . Drug use: No    Colonoscopy: yes, 2010 - due 2020.  PAP: 2017  Bone density: yes, 2015, normal Mammography: 07/01/2018   Allergies  Allergen Reactions  . Mango Flavor Hives  . Olive Tree Hives    Pt tolerates olive oil  . Penicillins Hives    Did it involve swelling of the face/tongue/throat, SOB, or low BP? Yes--syncope episode Did it involve sudden or severe rash/hives, skin peeling, or any reaction on the inside of your mouth or nose? No Did you need to seek medical attention at a hospital or doctor's office? Yes When did it last happen? 37-38 YEARS AGO If all above answers are "NO", may proceed with cephalosporin use.   . Adhesive [Tape] Other (See Comments)    "severe bruising"  ALSO SKIN GLUE  . Asa [Aspirin] Other (See Comments)    GI upset   . Metformin And Related Diarrhea    Current Outpatient Medications  Medication Sig Dispense Refill  . Accu-Chek  FastClix Lancets MISC 1 each by Does not apply route 2 (two) times daily. 100 each 3  . acetaminophen (TYLENOL) 500 MG tablet Take 1 tablet (500 mg total) by mouth 3 (three) times daily. 90 tablet 1  . albuterol (VENTOLIN HFA) 108 (90 Base) MCG/ACT inhaler Inhale 2 puffs into the lungs every 4 (four) hours as needed for wheezing or shortness of breath (cough, shortness of breath or wheezing.). 1 Inhaler 1  . amLODipine (NORVASC) 5 MG tablet Take 5 mg by mouth daily.    Marland Kitchen atenolol (TENORMIN) 100 MG tablet TAKE 1 TABLET BY MOUTH EVERY DAY (Patient taking differently: Take 100 mg by mouth at bedtime. ) 30 tablet 5  . blood glucose meter kit and supplies KIT Dispense based on patient and insurance preference. Use up to four times daily as directed. (FOR ICD-9 250.00, 250.01). 1 each 0  . capecitabine (XELODA) 500 MG tablet Take 3 tablets (1,500 mg total) by mouth 2 (two) times daily after a meal. Take daily for 14 days, then do not take for 7 days, then repeat. 84 tablet 6  . ciprofloxacin (CIPRO) 500 MG tablet Take 1 tablet (500 mg total) by mouth 2 (two) times daily. 6 tablet 0  . famotidine (PEPCID) 20 MG tablet Take 1 tablet (20 mg total) by mouth 2 (two) times daily. 60 tablet 1  . fentaNYL (DURAGESIC) 12 MCG/HR Place 1 patch onto the skin every 3 (three) days. 10 patch 0  . fentaNYL (DURAGESIC) 12 MCG/HR Place 1 patch onto the skin every 3 (three) days. Use with 25 mcg/hr patch 5 patch 0  . fentaNYL (DURAGESIC) 25 MCG/HR Place 1 patch onto the skin every 3 (three) days. Use together with 12.5 mcg patch 5 patch 0  . fluticasone-salmeterol (ADVAIR HFA) 115-21 MCG/ACT inhaler Inhale 2 puffs into the lungs 2 (two) times daily. 1 Inhaler 12  . glucose blood (ACCU-CHEK ACTIVE STRIPS) test strip Use as instructed- check blood sugar up to 2x a day 100 each 12  . KLOR-CON M20 20 MEQ tablet Take 20 mEq by mouth at bedtime.     . Lancets Misc. (ACCU-CHEK FASTCLIX LANCET) KIT 1 each by Does not apply route 2  (two) times daily. 1 kit 1  . ondansetron (ZOFRAN ODT) 4 MG disintegrating tablet Take 1 tablet (4 mg total) by mouth every 8 (eight) hours as needed for  nausea or vomiting. 20 tablet 2  . promethazine (PHENERGAN) 25 MG tablet Take 1 tablet (25 mg total) by mouth every 6 (six) hours as needed for nausea or vomiting. 30 tablet 0  . rivaroxaban (XARELTO) 20 MG TABS tablet Take 1 tablet (20 mg total) by mouth daily with supper. 30 tablet 3  . traMADol (ULTRAM) 50 MG tablet Take 2 tablets (100 mg total) by mouth every 6 (six) hours as needed for moderate pain. 60 tablet 0  . venlafaxine XR (EFFEXOR-XR) 75 MG 24 hr capsule Take 1 capsule (75 mg total) by mouth daily with breakfast. 30 capsule 3  . Vitamin D, Ergocalciferol, (DRISDOL) 1.25 MG (50000 UNIT) CAPS capsule TAKE 1 CAPSULE BY MOUTH ONE TIME PER WEEK (Patient taking differently: Take 50,000 Units by mouth every Saturday. ) 4 capsule 5   No current facility-administered medications for this visit.   Facility-Administered Medications Ordered in Other Visits  Medication Dose Route Frequency Provider Last Rate Last Admin  . 0.9 %  sodium chloride infusion   Intravenous Continuous Takiya Belmares, Virgie Dad, MD   Stopped at 12/24/19 1242    OBJECTIVE:  African-American woman examined in a wheelchair Vitals:   12/24/19 1023  BP: 111/68  Pulse: 90  Resp: 18  Temp: 97.9 F (36.6 C)  SpO2: 96%     Body mass index is 25.84 kg/m.   Wt Readings from Last 3 Encounters:  12/02/19 160 lb 1.6 oz (72.6 kg)  11/09/19 173 lb 3.2 oz (78.6 kg)  10/15/19 176 lb (79.8 kg)    ECOG FS:4 - Bedbound  Sclerae unicteric, EOMs intact Wearing a mask No cervical or supraclavicular adenopathy Lungs no rales or rhonchi, no breath sounds on left Heart regular rate and rhythm Abd soft, nontender MSK no focal spinal tenderness Neuro: nonfocal, answers question with some perseverance, lethargic affect Breasts: The right breast is status post lumpectomy and radiation.   The left breast is somewhat edematous but not erythematous.  LAB RESULTS:  CMP     Component Value Date/Time   NA 139 12/24/2019 0951   K 4.6 12/24/2019 0951   CL 101 12/24/2019 0951   CO2 21 (L) 12/24/2019 0951   GLUCOSE 143 (H) 12/24/2019 0951   BUN 30 (H) 12/24/2019 0951   CREATININE 1.18 (H) 12/24/2019 0951   CREATININE 0.89 10/02/2019 1416   CREATININE 0.70 04/14/2015 1558   CALCIUM 11.2 (H) 12/24/2019 0951   PROT 6.8 12/24/2019 0951   ALBUMIN 2.7 (L) 12/24/2019 0951   AST 39 12/24/2019 0951   AST 15 10/02/2019 1416   ALT 8 12/24/2019 0951   ALT 10 10/02/2019 1416   ALKPHOS 92 12/24/2019 0951   BILITOT 0.6 12/24/2019 0951   BILITOT 0.2 (L) 10/02/2019 1416   GFRNONAA 51 (L) 12/24/2019 0951   GFRNONAA >60 10/02/2019 1416   GFRNONAA 87 03/27/2014 1130   GFRAA 59 (L) 12/24/2019 0951   GFRAA >60 10/02/2019 1416   GFRAA >89 03/27/2014 1130    No results found for: TOTALPROTELP, ALBUMINELP, A1GS, A2GS, BETS, BETA2SER, GAMS, MSPIKE, SPEI  No results found for: KPAFRELGTCHN, LAMBDASER, KAPLAMBRATIO  Lab Results  Component Value Date   WBC 8.5 12/24/2019   NEUTROABS 6.4 12/24/2019   HGB 8.9 (L) 12/24/2019   HCT 27.6 (L) 12/24/2019   MCV 87.6 12/24/2019   PLT 318 12/24/2019   No results found for: LABCA2  No components found for: [id number]  No results for input(s): INR in the last 168 hours.  No results found  for: LABCA2  No results found for: CAN199  No results found for: CAN125  No results found for: NWG956  Lab Results  Component Value Date   CA2729 84.6 (H) 12/02/2019    No components found for: HGQUANT  No results found for: CEA1 / No results found for: CEA1   No results found for: AFPTUMOR  No results found for: CHROMOGRNA  No results found for: HGBA, HGBA2QUANT, HGBFQUANT, HGBSQUAN (Hemoglobinopathy evaluation)   Lab Results  Component Value Date   LDH 423 (H) 10/16/2019    Lab Results  Component Value Date   IRON 34 11/03/2019    TIBC 153 (L) 11/03/2019   IRONPCTSAT 22 11/03/2019   (Iron and TIBC)  Lab Results  Component Value Date   FERRITIN 1,284 (H) 11/03/2019    Urinalysis    Component Value Date/Time   COLORURINE YELLOW 10/31/2019 [place] 10/31/2019 0537   LABSPEC 1.023 10/31/2019 0537   PHURINE 5.0 10/31/2019 0537   GLUCOSEU NEGATIVE 10/31/2019 [place] 10/31/2019 [place] 10/31/2019 0537   BILIRUBINUR neg 11/24/2013 1547   KETONESUR 5 (A) 10/31/2019 0537   PROTEINUR NEGATIVE 10/31/2019 0537   UROBILINOGEN 0.2 11/24/2013 1547   UROBILINOGEN 0.2 11/10/2006 1153   NITRITE NEGATIVE 10/31/2019 0537   LEUKOCYTESUR NEGATIVE 10/31/2019 0537    STUDIES:  No results found.   ELIGIBLE FOR AVAILABLE RESEARCH PROTOCOL: No   ASSESSMENT: 59 y.o. [place], [place] woman status post right breast biopsy x2 on 07/10/2018 for multicentric invasive ductal carcinoma, clinically T1c N1, stage IIB, grade 3, functionally triple negative, with an MIB-1 of 70%  (a) right axillary lymph node biopsied at the same time was positive  (1) neoadjuvant chemotherapy consisting of doxorubicin and cyclophosphamide in dose dense fashion x4 started 08/07/2018, completed 09/16/2018, followed by weekly Abraxane and carboplatin x12 starting 08/07/2018, stopped after 2 cycles (last dose on 10/15/2018) due to peripheral neuropathy   (2) Right breast lumpectomy on 12/27/2018 shows a ypT1b ypN0 residual invasive ductal carcinoma, margins negative.  (a) 5 sentinel nodes were negative.   (b) Estrogen receptor 75% positive, weak, Progesterone receptor negative.  (3) adjuvant radiation  Radiation Treatment Dates: 02/18/2019 through 04/02/2019 Site Technique Total Dose (Gy) Dose per Fx (Gy) Completed Fx Beam Energies  Breast: Breast_Rt 3D 50.4/50.4 1.8 28/28 6X, 10X  Breast: Breast_Rt_axilla 3D 50.4/50.4 1.8 28/28 6X, 10X  Breast: Breast_Rt_Bst 3D 20/20 2 10/10 6X, 10X    (4) genetics  testing 08/06/2018 through the Breast + GYN Cancers Panel offered by [facility] found no deleterious mutations in ATM, BARD1, BRCA1, BRCA2, BRIP1, CDH1, CHEK2, DICER1, EPCAM, MLH1, MSH2, MSH6, NBN, NF1, PALB2, PMS2, PTEN, RAD50, RAD51C, RAD51D, SMARCA4, STK11, TP53.   (5) anastrozole started 05/14/2018  (a) bone density 09/22/2019 shows a T score of -0.8 (normal)  (b) anastrozole discontinued June 2021, with evidence of progression (metastases)  METASTATIC DISEASE: June 2021: left effusion, liver metastases  (6)CT renalstonestudy 09/30/2019 to evaluate left flank pain shows left pleural effusion and possible liver lesions (a)cytology from the left pleural effusion 10/03/2019 confirms metastatic disease (b)liver MRI 10/04/2019 shows multiple liver lesions (c) prognostic panel requested 10/10/2019 on cytology from 10/03/2019 fluid finds the tumor cells negative for HER-2, estrogen receptor, or the progesterone receptor, with an MIB-1 of 60%.  (d) noncontrast chest CT 11/09/2019 shows a large low-attenuation collection in the left hemithorax measuring up to 15.5 cm, with expansion of the left hemithorax to the right, there are also enlarged  left axillary nodes, and left breast skin thickening; liver lesion measuring 3.8 cm  (e) CA 27-29 moderately informative (84.6 on 12/02/2019)  (7) left effusion: left pleurx placement 10/13/2019        (a) Ct chest 11/09/2019 shows continuing left lung collapse, loculated pockets of fluid  (b) pleurx catheter removed 11/12/2019--left lung not expected to re-expand  (8) DVT left internal jugular, subclavian, axillary and brachial vv diagnosed by doppler 11/09/2019        (a) IV heparin started 11/09/2019, briefly on Lovenox  (b) transitioned to rivaroxaban 11/11/2019  (9) capecitabine started 10/28/2019 at standard doses        (a) cycle 2 resumed 11/18/2019  (b) discontinued 12/24/2019 with clinical evidence of  progression  (10) worsening mental status: Consider UTI, consider hypercalcemia of malignancy     PLAN: Candy has declined considerably since her last visit here.  Her functional status currently is very poor.  Part of this could well be disease progression and we do need to restage her.  Accordingly we are stopping the capecitabine and proceeding to a chest CT scan for reassessment.  However the family has noted a more acute worsening of symptoms in the last week or so.  The patient complains of burning with urination.  We are going to try to obtain a urinalysis and culture today but this may be difficult given her mental status.  I am going to treat her empirically with Cipro for 3 days and hopefully that problem will clear.  She also is moderately hypercalcemic.  She will receive pamidronate today which will take care of that problem at least temporarily.  Accordingly I am setting up a virtual visit with the patient/family in 2 weeks.  If if it has improved, then we will proceed to CT scan for restaging and then see me again in 4weeks for her next pamidronate dose.  If there has been no improvement in 2 weeks I think it will be time to consider hospice in this case.  We are upping the fentanyl back to 37 mcg/h patches given the increase in pain complaints by the patient  I want to comment that the family including now the sons are making an extraordinary effort at keeping her mother safe and comfortable at home.    Total encounter time 35 minutes.Sarajane Jews C. Clifford Benninger, MD  12/24/19 6:31 PM Medical Oncology and Hematology [facility] [place], [place] [address] Tel. [phone]    Fax. [phone]   I, Wilburn Mylar, am acting as scribe for Dr. Virgie Dad. Jazzmin Newbold.  I, Lurline Del MD, have reviewed the above documentation for accuracy and completeness, and I agree with the above.   *Total Encounter Time as defined by the Centers for Medicare  and Medicaid Services includes, in addition to the face-to-face time of a patient visit (documented in the note above) non-face-to-face time: obtaining and reviewing outside history, ordering and reviewing medications, tests or procedures, care coordination (communications with other health care professionals or caregivers) and documentation in the medical record.

## 2019-12-24 ENCOUNTER — Other Ambulatory Visit: Payer: Self-pay

## 2019-12-24 ENCOUNTER — Other Ambulatory Visit: Payer: 59

## 2019-12-24 ENCOUNTER — Inpatient Hospital Stay (HOSPITAL_BASED_OUTPATIENT_CLINIC_OR_DEPARTMENT_OTHER): Payer: 59 | Admitting: Oncology

## 2019-12-24 ENCOUNTER — Inpatient Hospital Stay: Payer: 59

## 2019-12-24 ENCOUNTER — Inpatient Hospital Stay: Payer: 59 | Attending: Oncology

## 2019-12-24 ENCOUNTER — Ambulatory Visit: Payer: 59 | Admitting: Oncology

## 2019-12-24 VITALS — BP 113/68 | HR 70 | Resp 20

## 2019-12-24 VITALS — BP 111/68 | HR 90 | Temp 97.9°F | Resp 18 | Ht 66.0 in

## 2019-12-24 DIAGNOSIS — Z171 Estrogen receptor negative status [ER-]: Secondary | ICD-10-CM | POA: Insufficient documentation

## 2019-12-24 DIAGNOSIS — N200 Calculus of kidney: Secondary | ICD-10-CM | POA: Diagnosis not present

## 2019-12-24 DIAGNOSIS — Z95828 Presence of other vascular implants and grafts: Secondary | ICD-10-CM

## 2019-12-24 DIAGNOSIS — Z9221 Personal history of antineoplastic chemotherapy: Secondary | ICD-10-CM | POA: Insufficient documentation

## 2019-12-24 DIAGNOSIS — IMO0001 Reserved for inherently not codable concepts without codable children: Secondary | ICD-10-CM

## 2019-12-24 DIAGNOSIS — Z923 Personal history of irradiation: Secondary | ICD-10-CM | POA: Insufficient documentation

## 2019-12-24 DIAGNOSIS — R309 Painful micturition, unspecified: Secondary | ICD-10-CM | POA: Diagnosis not present

## 2019-12-24 DIAGNOSIS — J910 Malignant pleural effusion: Secondary | ICD-10-CM

## 2019-12-24 DIAGNOSIS — Z170 Estrogen receptor positive status [ER+]: Secondary | ICD-10-CM

## 2019-12-24 DIAGNOSIS — C50811 Malignant neoplasm of overlapping sites of right female breast: Secondary | ICD-10-CM | POA: Diagnosis not present

## 2019-12-24 DIAGNOSIS — E1165 Type 2 diabetes mellitus with hyperglycemia: Secondary | ICD-10-CM

## 2019-12-24 DIAGNOSIS — C787 Secondary malignant neoplasm of liver and intrahepatic bile duct: Secondary | ICD-10-CM

## 2019-12-24 DIAGNOSIS — Z86718 Personal history of other venous thrombosis and embolism: Secondary | ICD-10-CM | POA: Insufficient documentation

## 2019-12-24 DIAGNOSIS — Z79899 Other long term (current) drug therapy: Secondary | ICD-10-CM | POA: Insufficient documentation

## 2019-12-24 DIAGNOSIS — C773 Secondary and unspecified malignant neoplasm of axilla and upper limb lymph nodes: Secondary | ICD-10-CM | POA: Diagnosis not present

## 2019-12-24 DIAGNOSIS — Z7189 Other specified counseling: Secondary | ICD-10-CM

## 2019-12-24 DIAGNOSIS — I700 Atherosclerosis of aorta: Secondary | ICD-10-CM

## 2019-12-24 DIAGNOSIS — M25572 Pain in left ankle and joints of left foot: Secondary | ICD-10-CM

## 2019-12-24 DIAGNOSIS — C50911 Malignant neoplasm of unspecified site of right female breast: Secondary | ICD-10-CM | POA: Insufficient documentation

## 2019-12-24 LAB — CBC WITH DIFFERENTIAL/PLATELET
Abs Immature Granulocytes: 0.47 10*3/uL — ABNORMAL HIGH (ref 0.00–0.07)
Basophils Absolute: 0 10*3/uL (ref 0.0–0.1)
Basophils Relative: 0 %
Eosinophils Absolute: 0.1 10*3/uL (ref 0.0–0.5)
Eosinophils Relative: 1 %
HCT: 27.6 % — ABNORMAL LOW (ref 36.0–46.0)
Hemoglobin: 8.9 g/dL — ABNORMAL LOW (ref 12.0–15.0)
Immature Granulocytes: 6 %
Lymphocytes Relative: 10 %
Lymphs Abs: 0.8 10*3/uL (ref 0.7–4.0)
MCH: 28.3 pg (ref 26.0–34.0)
MCHC: 32.2 g/dL (ref 30.0–36.0)
MCV: 87.6 fL (ref 80.0–100.0)
Monocytes Absolute: 0.8 10*3/uL (ref 0.1–1.0)
Monocytes Relative: 10 %
Neutro Abs: 6.4 10*3/uL (ref 1.7–7.7)
Neutrophils Relative %: 73 %
Platelets: 318 10*3/uL (ref 150–400)
RBC: 3.15 MIL/uL — ABNORMAL LOW (ref 3.87–5.11)
RDW: 21.2 % — ABNORMAL HIGH (ref 11.5–15.5)
WBC: 8.5 10*3/uL (ref 4.0–10.5)
nRBC: 0.2 % (ref 0.0–0.2)

## 2019-12-24 LAB — COMPREHENSIVE METABOLIC PANEL
ALT: 8 U/L (ref 0–44)
AST: 39 U/L (ref 15–41)
Albumin: 2.7 g/dL — ABNORMAL LOW (ref 3.5–5.0)
Alkaline Phosphatase: 92 U/L (ref 38–126)
Anion gap: 17 — ABNORMAL HIGH (ref 5–15)
BUN: 30 mg/dL — ABNORMAL HIGH (ref 6–20)
CO2: 21 mmol/L — ABNORMAL LOW (ref 22–32)
Calcium: 11.2 mg/dL — ABNORMAL HIGH (ref 8.9–10.3)
Chloride: 101 mmol/L (ref 98–111)
Creatinine, Ser: 1.18 mg/dL — ABNORMAL HIGH (ref 0.44–1.00)
GFR calc Af Amer: 59 mL/min — ABNORMAL LOW (ref >60)
GFR calc non Af Amer: 51 mL/min — ABNORMAL LOW (ref >60)
Glucose, Bld: 143 mg/dL — ABNORMAL HIGH (ref 70–99)
Potassium: 4.6 mmol/L (ref 3.5–5.1)
Sodium: 139 mmol/L (ref 135–145)
Total Bilirubin: 0.6 mg/dL (ref 0.3–1.2)
Total Protein: 6.8 g/dL (ref 6.5–8.1)

## 2019-12-24 LAB — TYPE AND SCREEN
ABO/RH(D): O POS
Antibody Screen: NEGATIVE

## 2019-12-24 MED ORDER — SODIUM CHLORIDE 0.9 % IV SOLN
INTRAVENOUS | Status: AC
  Filled 2019-12-24: qty 250

## 2019-12-24 MED ORDER — SODIUM CHLORIDE 0.9 % IV SOLN
INTRAVENOUS | Status: DC
  Filled 2019-12-24: qty 250

## 2019-12-24 MED ORDER — TRAMADOL HCL 50 MG PO TABS
ORAL_TABLET | ORAL | Status: AC
  Filled 2019-12-24: qty 2

## 2019-12-24 MED ORDER — FENTANYL 25 MCG/HR TD PT72: 1.0000 | MEDICATED_PATCH | TRANSDERMAL | 0 refills | Status: AC

## 2019-12-24 MED ORDER — CIPROFLOXACIN HCL 500 MG PO TABS
500.0000 mg | ORAL_TABLET | Freq: Two times a day (BID) | ORAL | 0 refills | Status: AC
Start: 2019-12-24 — End: ?

## 2019-12-24 MED ORDER — TRAMADOL HCL 50 MG PO TABS
100.0000 mg | ORAL_TABLET | Freq: Once | ORAL | Status: AC
  Administered 2019-12-24: 100 mg via ORAL

## 2019-12-24 MED ORDER — FENTANYL 12 MCG/HR TD PT72: 1.0000 | MEDICATED_PATCH | TRANSDERMAL | 0 refills | Status: AC

## 2019-12-24 MED ORDER — SODIUM CHLORIDE 0.9 % IV SOLN
90.0000 mg | Freq: Once | INTRAVENOUS | Status: AC
  Administered 2019-12-24: 90 mg via INTRAVENOUS
  Filled 2019-12-24: qty 10

## 2019-12-24 NOTE — Patient Instructions (Signed)
Pamidronate injection What is this medicine? PAMIDRONATE (pa mi DROE nate) slows calcium loss from bones. It is used to treat high calcium blood levels from cancer or Paget's disease. It is also used to treat bone pain and prevent fractures from certain cancers that have spread to the bone. This medicine may be used for other purposes; ask your health care provider or pharmacist if you have questions. COMMON BRAND NAME(S): Aredia What should I tell my health care provider before I take this medicine? They need to know if you have any of these conditions:  aspirin-sensitive asthma  dental disease  kidney disease  an unusual or allergic reaction to pamidronate, other medicines, foods, dyes, or preservatives  pregnant or trying to get pregnant  breast-feeding How should I use this medicine? This medicine is for infusion into a vein. It is given by a health care professional in a hospital or clinic setting. Talk to your pediatrician regarding the use of this medicine in children. This medicine is not approved for use in children. Overdosage: If you think you have taken too much of this medicine contact a poison control center or emergency room at once. NOTE: This medicine is only for you. Do not share this medicine with others. What if I miss a dose? This does not apply. What may interact with this medicine?  certain antibiotics given by injection  medicines for inflammation or pain like ibuprofen, naproxen  some diuretics like bumetanide, furosemide  cyclosporine  parathyroid hormone  tacrolimus  teriparatide  thalidomide This list may not describe all possible interactions. Give your health care provider a list of all the medicines, herbs, non-prescription drugs, or dietary supplements you use. Also tell them if you smoke, drink alcohol, or use illegal drugs. Some items may interact with your medicine. What should I watch for while using this medicine? Visit your doctor or  health care professional for regular checkups. It may be some time before you see the benefit from this medicine. Do not stop taking your medicine unless your doctor tells you to. Your doctor may order blood tests or other tests to see how you are doing. Women should inform their doctor if they wish to become pregnant or think they might be pregnant. There is a potential for serious side effects to an unborn child. Talk to your health care professional or pharmacist for more information. You should make sure that you get enough calcium and vitamin D while you are taking this medicine. Discuss the foods you eat and the vitamins you take with your health care professional. Some people who take this medicine have severe bone, joint, and/or muscle pain. This medicine may also increase your risk for a broken thigh bone. Tell your doctor right away if you have pain in your upper leg or groin. Tell your doctor if you have any pain that does not go away or that gets worse. What side effects may I notice from receiving this medicine? Side effects that you should report to your doctor or health care professional as soon as possible:  allergic reactions like skin rash, itching or hives, swelling of the face, lips, or tongue  black or tarry stools  changes in vision  eye inflammation, pain  high blood pressure  jaw pain, especially burning or cramping  muscle weakness  numb, tingling pain  swelling of feet or hands  trouble passing urine or change in the amount of urine  unable to move easily Side effects that usually do not  require medical attention (report to your doctor or health care professional if they continue or are bothersome):  bone, joint, or muscle pain  constipation  dizzy, drowsy  fever  headache  loss of appetite  nausea, vomiting  pain at site where injected This list may not describe all possible side effects. Call your doctor for medical advice about side effects.  You may report side effects to FDA at 1-800-FDA-1088. Where should I keep my medicine? This drug is given in a hospital or clinic and will not be stored at home. NOTE: This sheet is a summary. It may not cover all possible information. If you have questions about this medicine, talk to your doctor, pharmacist, or health care provider.  2020 Elsevier/Gold Standard (2010-10-21 08:49:49)   Rehydration, Adult Rehydration is the replacement of body fluids and salts and minerals (electrolytes) that are lost during dehydration. Dehydration is when there is not enough fluid or water in the body. This happens when you lose more fluids than you take in. Common causes of dehydration include:  Vomiting.  Diarrhea.  Excessive sweating, such as from heat exposure or exercise.  Taking medicines that cause the body to lose excess fluid (diuretics).  Impaired kidney function.  Not drinking enough fluid.  Certain illnesses or infections.  Certain poorly controlled long-term (chronic) illnesses, such as diabetes, heart disease, and kidney disease.  Symptoms of mild dehydration may include thirst, dry lips and mouth, dry skin, and dizziness. Symptoms of severe dehydration may include increased heart rate, confusion, fainting, and not urinating. You can rehydrate by drinking certain fluids or getting fluids through an IV tube, as told by your health care provider. What are the risks? Generally, rehydration is safe. However, one problem that can happen is taking in too much fluid (overhydration). This is rare. If overhydration happens, it can cause an electrolyte imbalance, kidney failure, or a decrease in salt (sodium) levels in the body. How to rehydrate Follow instructions from your health care provider for rehydration. The kind of fluid you should drink and the amount you should drink depend on your condition.  If directed by your health care provider, drink an oral rehydration solution (ORS). This  is a drink designed to treat dehydration that is found in pharmacies and retail stores. ? Make an ORS by following instructions on the package. ? Start by drinking small amounts, about  cup (120 mL) every 5-10 minutes. ? Slowly increase how much you drink until you have taken the amount recommended by your health care provider.  Drink enough clear fluids to keep your urine clear or pale yellow. If you were instructed to drink an ORS, finish the ORS first, then start slowly drinking other clear fluids. Drink fluids such as: ? Water. Do not drink only water. Doing that can lead to having too little sodium in your body (hyponatremia). ? Ice chips. ? Fruit juice that you have added water to (diluted juice). ? Low-calorie sports drinks.  If you are severely dehydrated, your health care provider may recommend that you receive fluids through an IV tube in the hospital.  Do not take sodium tablets. Doing that can lead to the condition of having too much sodium in your body (hypernatremia). Eating while you rehydrate Follow instructions from your health care provider about what to eat while you rehydrate. Your health care provider may recommend that you slowly begin eating regular foods in small amounts.  Eat foods that contain a healthy balance of electrolytes, such as bananas,  oranges, potatoes, tomatoes, and spinach.  Avoid foods that are greasy or contain a lot of fat or sugar.  In some cases, you may get nutrition through a feeding tube that is passed through your nose and into your stomach (nasogastric tube, or NG tube). This may be done if you have uncontrolled vomiting or diarrhea. Beverages to avoid Certain beverages may make dehydration worse. While you rehydrate, avoid:  Alcohol.  Caffeine.  Drinks that contain a lot of sugar. These include: ? High-calorie sports drinks. ? Fruit juice that is not diluted. ? Soda.  Check nutrition labels to see how much sugar or caffeine a  beverage contains. Signs of dehydration recovery You may be recovering from dehydration if:  You are urinating more often than before you started rehydrating.  Your urine is clear or pale yellow.  Your energy level improves.  You vomit less frequently.  You have diarrhea less frequently.  Your appetite improves or returns to normal.  You feel less dizzy or less light-headed.  Your skin tone and color start to look more normal. Contact a health care provider if:  You continue to have symptoms of mild dehydration, such as: ? Thirst. ? Dry lips. ? Slightly dry mouth. ? Dry, warm skin. ? Dizziness.  You continue to vomit or have diarrhea. Get help right away if:  You have symptoms of dehydration that get worse.  You feel: ? Confused. ? Weak. ? Like you are going to faint.  You have not urinated in 6-8 hours.  You have very dark urine.  You have trouble breathing.  Your heart rate while sitting still is over 100 beats a minute.  You cannot drink fluids without vomiting.  You have vomiting or diarrhea that: ? Gets worse. ? Does not go away.  You have a fever. This information is not intended to replace advice given to you by your health care provider. Make sure you discuss any questions you have with your health care provider. Document Revised: 04/06/2017 Document Reviewed: 06/18/2015 Elsevier Patient Education  2020 [employer].

## 2019-12-24 NOTE — Progress Notes (Signed)
Per [name] patient to have IVF's 500 mL over 2 hours.   Orders placed.

## 2019-12-25 ENCOUNTER — Telehealth: Payer: Self-pay | Admitting: *Deleted

## 2019-12-25 LAB — CANCER ANTIGEN 27.29: CA 27.29: 118.1 U/mL — ABNORMAL HIGH (ref 0.0–38.6)

## 2019-12-25 NOTE — Telephone Encounter (Signed)
VM left by PT Roselie Awkward stating he has been requested to hold on physical therapy at present per the daughter due to continued weakness and will follow up next week.  Return call number for Roselie Awkward is [phone].

## 2019-12-26 ENCOUNTER — Telehealth: Payer: Self-pay | Admitting: *Deleted

## 2019-12-26 ENCOUNTER — Other Ambulatory Visit: Payer: Self-pay | Admitting: *Deleted

## 2019-12-26 NOTE — Telephone Encounter (Signed)
This RN spoke with the patient's daughter- Autumn Cook - who states pt is unable to " get out of bed or move much on her own "  Autumn Cook was advised to request an order for a " lift" for the patient to help the family with getting the patient out of the bed.  This RN discussed above- including need to be educated on how to use it- order will be obtained.  This RN spoke with [facility] nursing who states the order needs to be faxed to [facility].  The patient faxed order to [facility] with request to be delivered ASAP due to last nursing visit scheduled for 12/30/2019 per home health.  This RN called Autumn Cook to inform her of the above- obtained her VM- message left per above information.

## 2019-12-29 ENCOUNTER — Telehealth: Payer: Self-pay | Admitting: *Deleted

## 2019-12-29 ENCOUNTER — Other Ambulatory Visit: Payer: Self-pay | Admitting: Oncology

## 2019-12-29 NOTE — Telephone Encounter (Signed)
This RN called [facility]- and spoke with Ebony Hail in intake - stating urgent need of admission for services this evening- post MD informing this RN that [facility] cannot come out until tomorrow per his discussion with the EMS who contacted them.  Of note the Fire Dept  was called out to the home due to pt fall and a request for  " patient lift". Upon returning the pt to the bed the Firemen felt EMS was indicated due to noted lethargy and decreased respirations. EMS arrived- contacted this office for pt's care status and for information on which Hospice service has been called for contact due to concern for impending death.  Per call to the [facility] a full  description of need discussed- including pending demise- and concern for need for additional support medications that are not in the home and if beneficial and able admission to the Hospice facility.  Per Ebony Hail the On Call nurse will go to home as soon as she completes the case she is with presently.  This RN then called and cancelled referral with [facility].

## 2019-12-29 NOTE — Telephone Encounter (Signed)
This RN spoke with the pt's daugher- Autumn Cook - who states since visit last week - pt has continued to decline.  She is now full weight for care.  Friday pm - pt and her other daughter fell while transferring " she fell - but she fell on top of my sister"  Post falling [name] stated she had increased pain on her R side - ? Hip area - EMS was called " they checked her out and didn't see anything that she needed to go to the hospital but did say to contact your office "  Autumn Cook states mentally her mom is more confused- " it takes a lot to get her to swallow even water- and with her pills she sometimes just leave them on her tongue ".  Autumn Cook states presently her mom states " my leg feels like it is on fire "  She has given her tramadol and is keeping her on a scheduled dose for better pain control.  Autumn Cook is asking " can we call Hospice to come check mom out and see what they say- I know we talked about waiting an additional 2 weeks but she is just not getting stronger "  Per MD review this RN contacted hospice and discussed above- [facility] will contact Autumn Cook to arrange a visit as soon as possible.

## 2019-12-30 ENCOUNTER — Telehealth: Payer: Self-pay | Admitting: *Deleted

## 2019-12-30 NOTE — Telephone Encounter (Signed)
This RN spoke with Autumn Cook this AM.  She states hospice nurse came out last evening " and was very helpful- it helped [place] know what we thought was happening is- and helped [place] know we are making the right decision "  Pt is comfortable - unresponsive except for minimal episodes.  Presently pt will stay home " the nurse feels mom's situation is more imminent- and we will see if she needs to go to the facility "  Autumn Cook stated " thank you for getting Hospice out last night ".  This RN validated [name]'s plan and care. Encouragement verbalized to her and family during this time.  No other needs at this time.

## 2020-01-01 ENCOUNTER — Telehealth: Payer: Self-pay | Admitting: Family Medicine

## 2020-01-01 ENCOUNTER — Telehealth: Payer: Self-pay | Admitting: Oncology

## 2020-01-01 ENCOUNTER — Other Ambulatory Visit: Payer: Self-pay | Admitting: Oncology

## 2020-01-07 ENCOUNTER — Telehealth: Payer: 59 | Admitting: Oncology

## 2020-01-07 NOTE — Telephone Encounter (Signed)
Cancelled appts per 8/25 staff msg. Called pt's daughter to inform of cancellations. Was unable to reach daughter and voicemail box was full.

## 2020-01-07 NOTE — Telephone Encounter (Signed)
Called and left message with her daughter [name], we are so sorry for the loss of her mother.

## 2020-01-07 NOTE — Telephone Encounter (Signed)
Call [place] back.  I am glad to sign death certificate as soon as it is received

## 2020-01-07 NOTE — Progress Notes (Signed)
Ms Geoghegan died 01/27/2020. I contacted the family.  GM

## 2020-01-07 NOTE — Telephone Encounter (Signed)
CallerHassan Rowan Call Back # [phone]  Patient's daughter called in reference to Dr Lorelei Pont signing death certificate for her mother. She would like to know if Dr. Lorelei Pont will sign certificate ?

## 2020-01-07 DEATH — deceased

## 2020-01-14 ENCOUNTER — Other Ambulatory Visit: Payer: 59

## 2020-01-19 ENCOUNTER — Ambulatory Visit: Payer: 59

## 2020-01-19 ENCOUNTER — Ambulatory Visit: Payer: 59 | Admitting: Adult Health

## 2020-01-19 ENCOUNTER — Other Ambulatory Visit: Payer: 59

## 2020-07-30 IMAGING — MG MM PLC BREAST LOC DEV 1ST LESION INC*R*
8 of 12 series · 8 of 12 positions shown · non-contrast
Comparison: Previous exam(s).

CLINICAL DATA: Patient for preoperative localization prior to right
breast lumpectomy.

EXAM:
MAMMOGRAPHIC GUIDED RADIOACTIVE SEED LOCALIZATION OF THE RIGHT
BREAST

[R CC (1 of 5)]
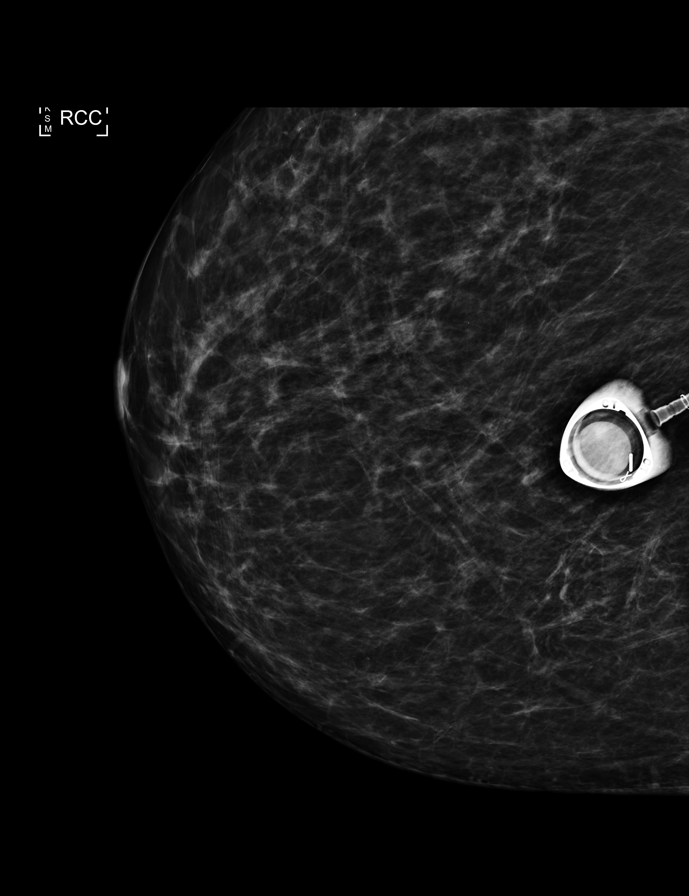

[R CC (2 of 5)]
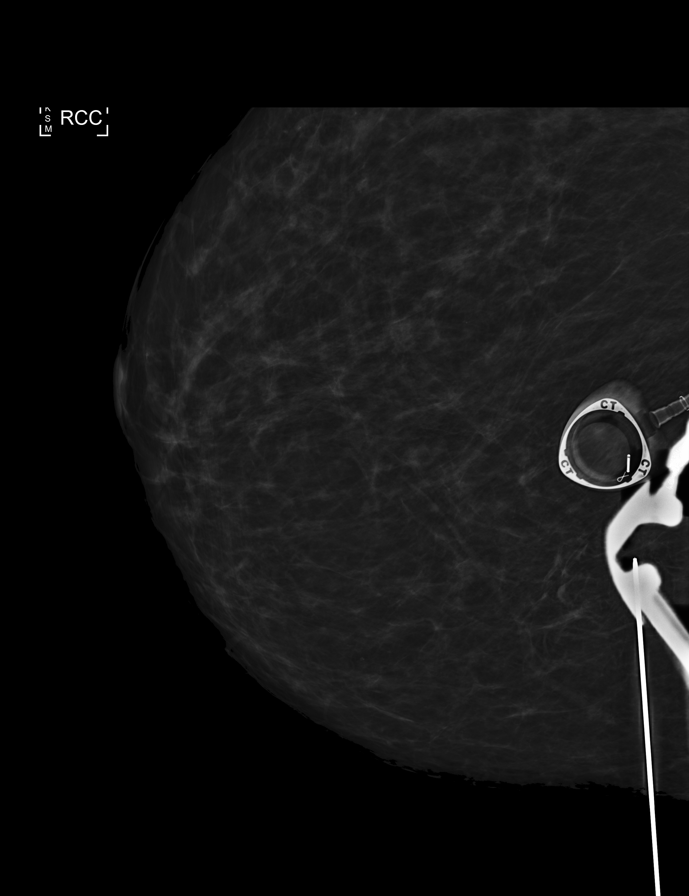

[R CC (3 of 5)]
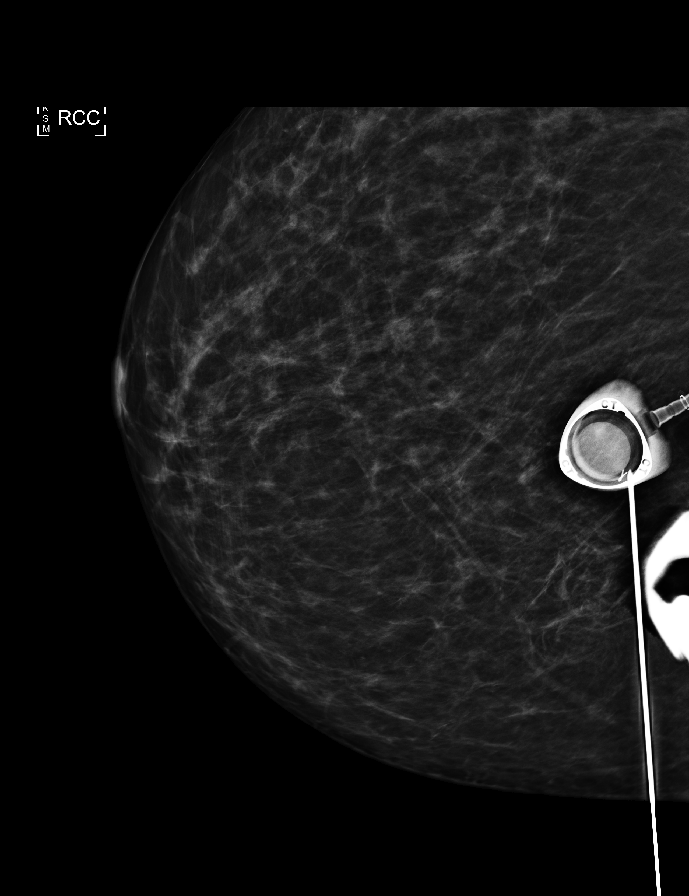

[R CC (4 of 5)]
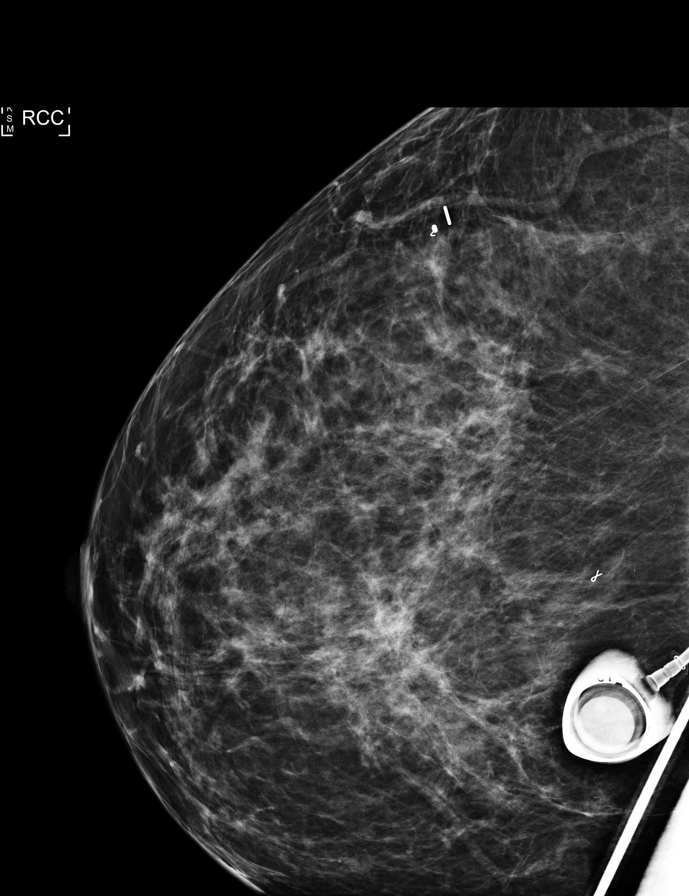

[R LM (1 of 3)]
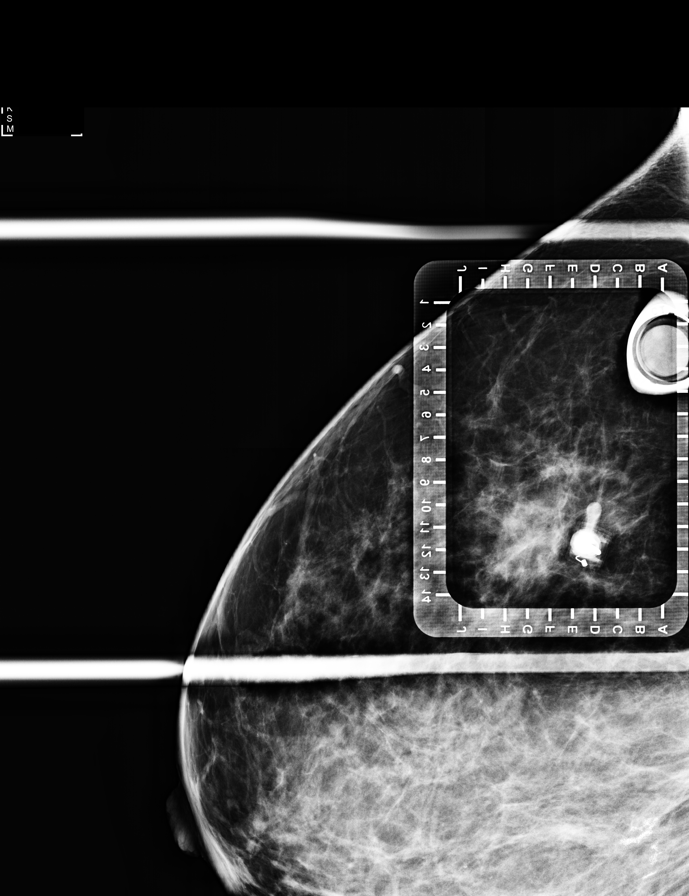

[R LM (2 of 3)]
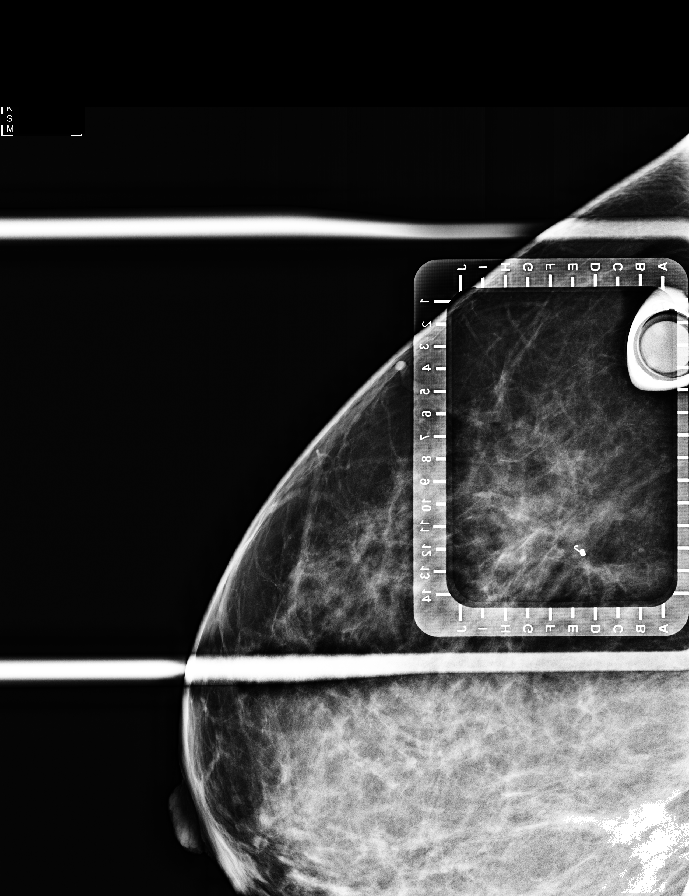

[R LM (3 of 3)]
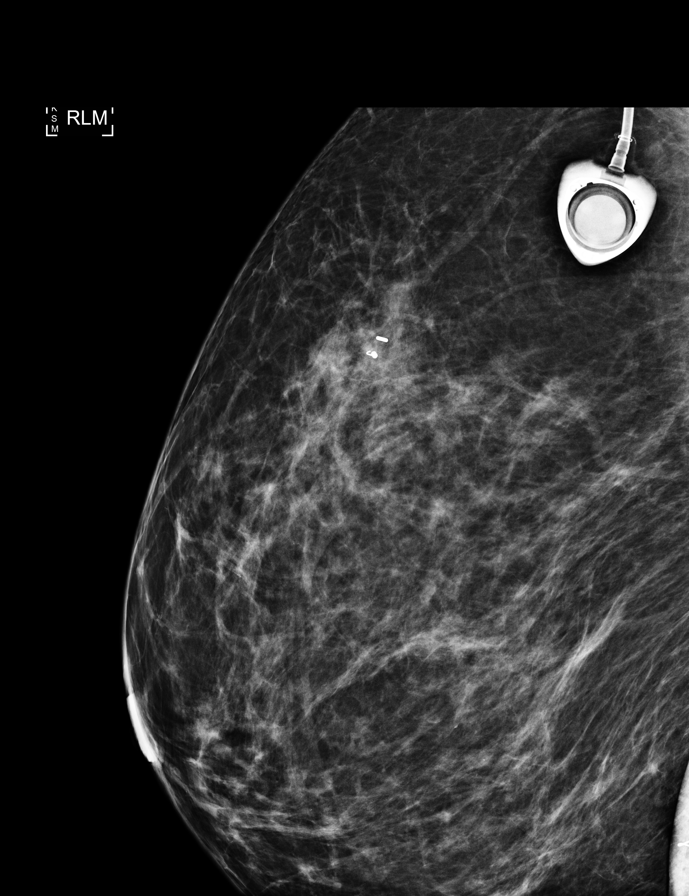

[R CC (5 of 5)]
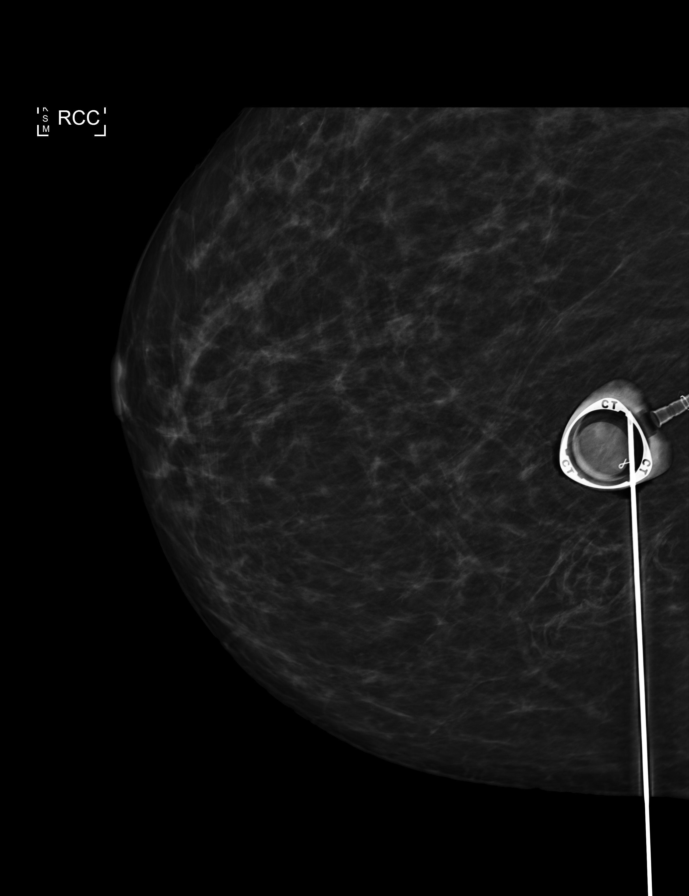

[8 of 12 positions shown; findings below may reference images not displayed]

FINDINGS: Patient presents for radioactive seed localization prior to right
lumpectomy. I met with the patient and we discussed the procedure of
seed localization including benefits and alternatives. We discussed
the high likelihood of a successful procedure. We discussed the
risks of the procedure including infection, bleeding, tissue injury
and further surgery. We discussed the low dose of radioactivity
involved in the procedure. Informed, written consent was given.

The usual time-out protocol was performed immediately prior to the
procedure.

Using mammographic guidance, sterile technique, 1% lidocaine and an
F-[id number] radioactive seed, mass within the outer right breast coil clip
was localized using a lateral approach. The follow-up mammogram
images confirm the seed in the expected location and were marked for
Dr. Yisrael.

Follow-up survey of the patient confirms presence of the radioactive
seed.

Order number of F-A52 seed:  [id number].

Total activity:  0.251 millicuries reference Date: 12/06/2018

The patient tolerated the procedure well and was released from the
[REDACTED]. She was given instructions regarding seed removal.
IMPRESSION: Radioactive seed localization right breast. No apparent
complications.

## 2020-07-31 IMAGING — DX BREAST SURGICAL SPECIMEN
1 series · 2 of 2 positions shown · non-contrast
Comparison: Previous exam(s).

CLINICAL DATA: Evaluate surgical specimen following excision of
RIGHT axillary lymph node containing metastatic disease.

EXAM:
SPECIMEN RADIOGRAPH OF THE RIGHT AXILLA

[Series 2: specimen digital x-ray, derived · right · 2 of 2 slices shown]
[im 1/2]
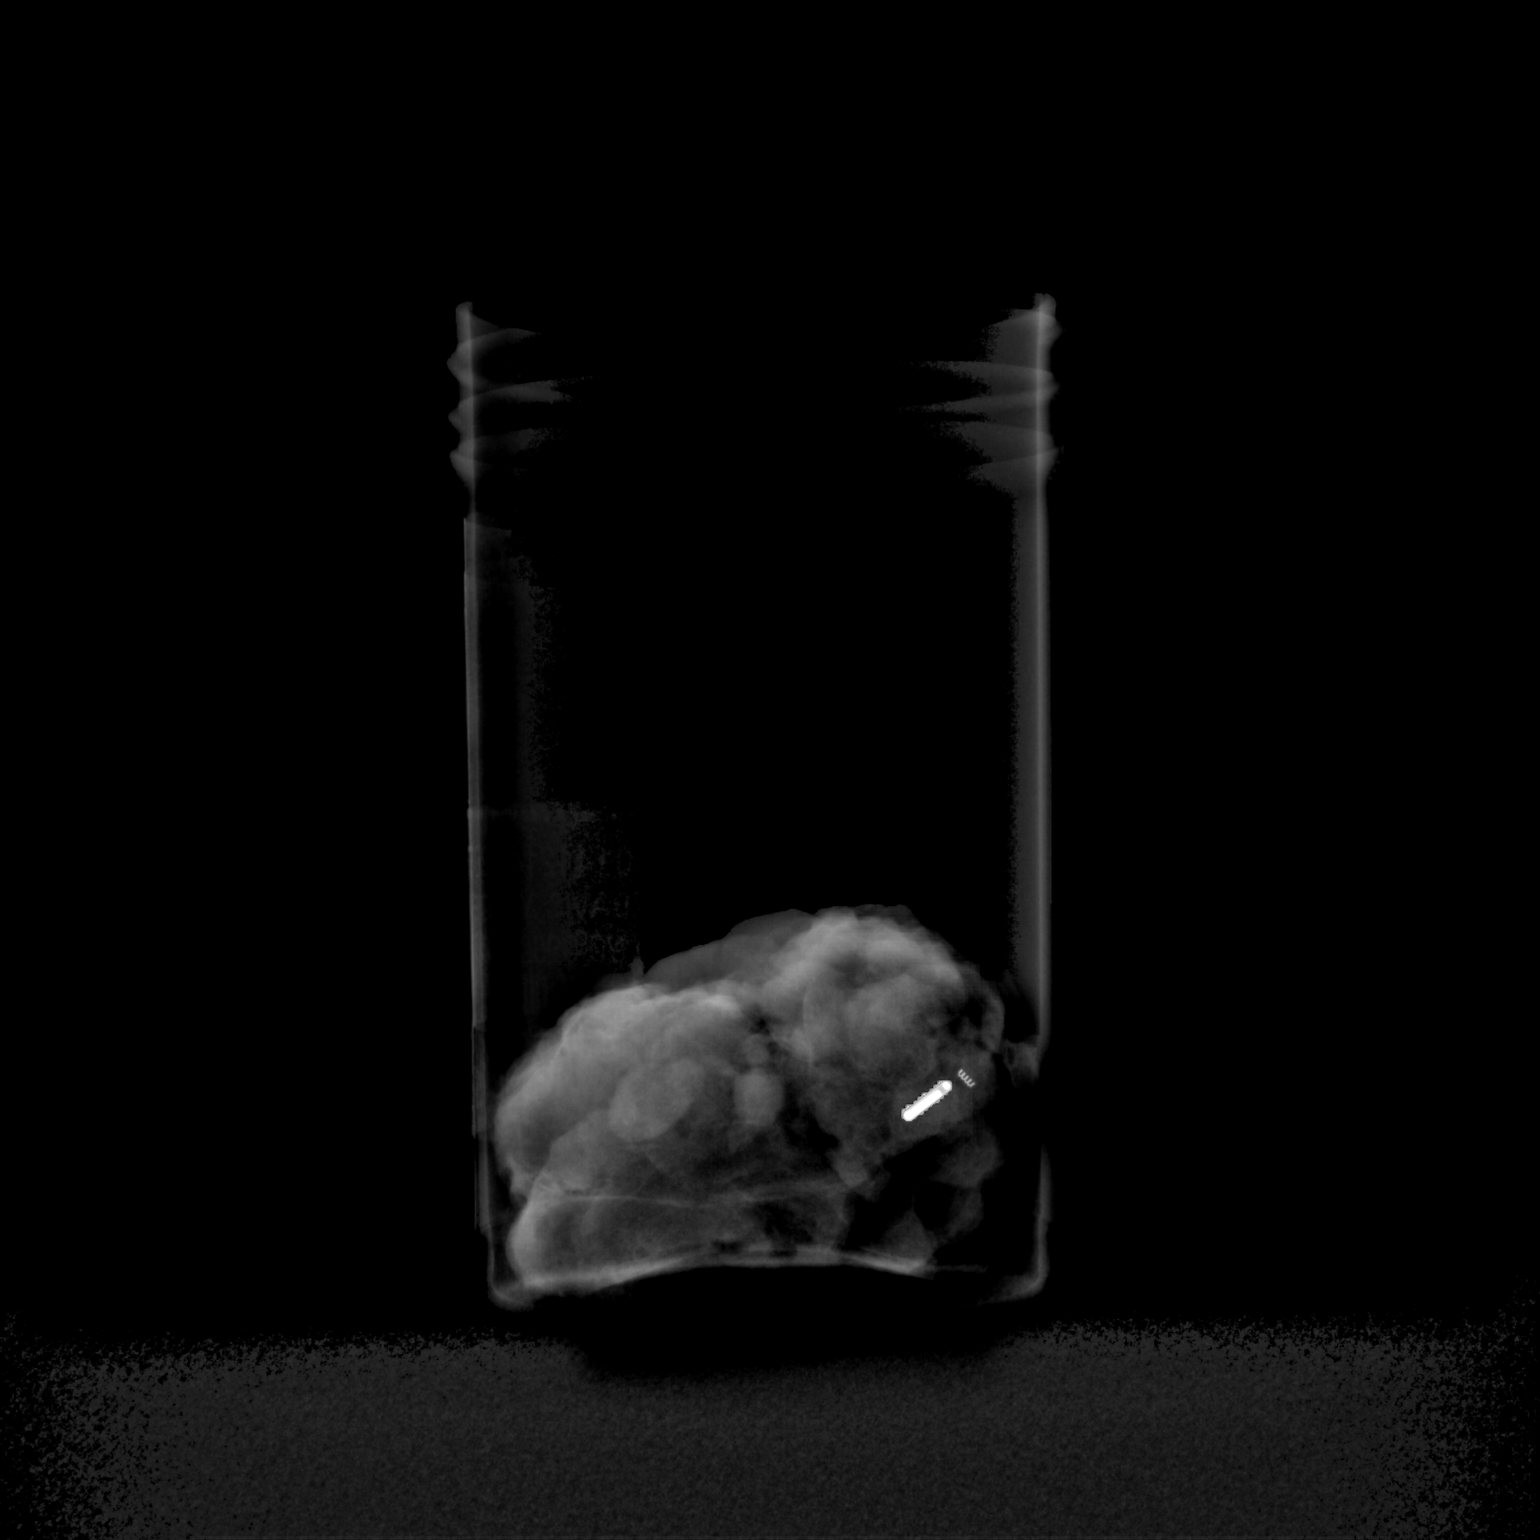
[im 2/2]
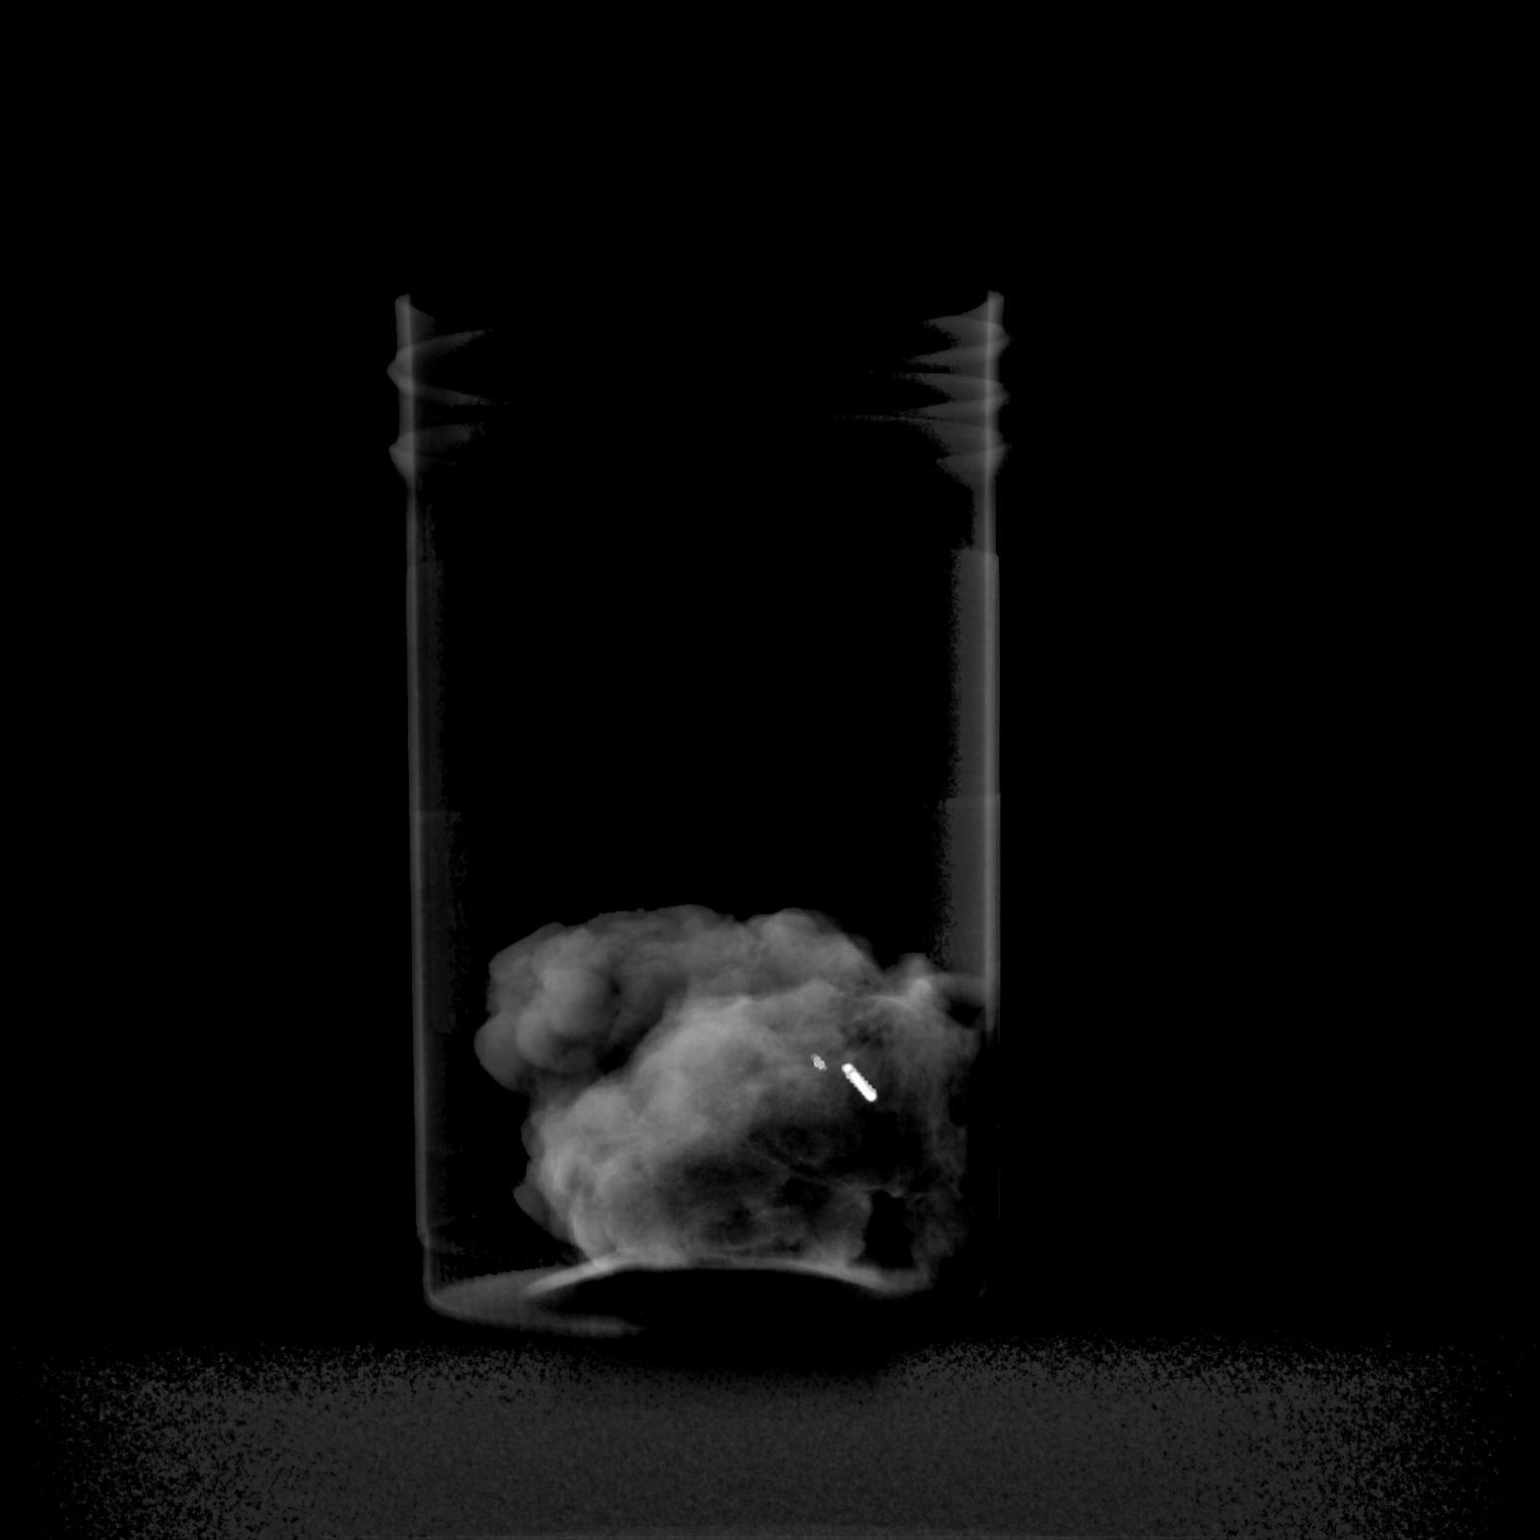

[2 of 2 positions shown; findings below may reference images not displayed]

FINDINGS: Status post excision of the RIGHT axilla. The radioactive seed and
biopsy marker clip are present and completely intact.
IMPRESSION: Specimen radiograph of the RIGHT axilla.
